# Patient Record
Sex: Female | Born: 1970 | Race: White | Hispanic: No | Marital: Single | Smoking: Current every day smoker
Health system: Southern US, Community
[De-identification: ages and names within clinical notes are randomized; demographics above are authoritative.]

## PROBLEM LIST (undated history)

## (undated) DIAGNOSIS — A0472 Enterocolitis due to Clostridium difficile, not specified as recurrent: Secondary | ICD-10-CM

## (undated) DIAGNOSIS — F419 Anxiety disorder, unspecified: Secondary | ICD-10-CM

## (undated) DIAGNOSIS — L988 Other specified disorders of the skin and subcutaneous tissue: Secondary | ICD-10-CM

## (undated) DIAGNOSIS — I10 Essential (primary) hypertension: Secondary | ICD-10-CM

## (undated) DIAGNOSIS — F319 Bipolar disorder, unspecified: Secondary | ICD-10-CM

## (undated) DIAGNOSIS — T1491XA Suicide attempt, initial encounter: Secondary | ICD-10-CM

## (undated) DIAGNOSIS — F32A Depression, unspecified: Secondary | ICD-10-CM

## (undated) DIAGNOSIS — A4902 Methicillin resistant Staphylococcus aureus infection, unspecified site: Secondary | ICD-10-CM

## (undated) DIAGNOSIS — E785 Hyperlipidemia, unspecified: Secondary | ICD-10-CM

## (undated) DIAGNOSIS — K219 Gastro-esophageal reflux disease without esophagitis: Secondary | ICD-10-CM

## (undated) DIAGNOSIS — F329 Major depressive disorder, single episode, unspecified: Secondary | ICD-10-CM

## (undated) HISTORY — DX: Enterocolitis due to Clostridium difficile, not specified as recurrent: A04.72

## (undated) HISTORY — PX: APPENDECTOMY: SHX54

## (undated) HISTORY — DX: Hyperlipidemia, unspecified: E78.5

## (undated) HISTORY — DX: Suicide attempt, initial encounter: T14.91XA

## (undated) HISTORY — DX: Gastro-esophageal reflux disease without esophagitis: K21.9

## (undated) HISTORY — DX: Essential (primary) hypertension: I10

## (undated) NOTE — *Deleted (*Deleted)
   08/09/20 1330  Clinical Encounter Type  Visited With Other (Comment)  Visit Type Follow-up  Referral From Patient  Consult/Referral To Chaplain  Chaplain spoke with the patient via the phone. Chaplain confirmed the patient still desire to speak with Buddhist monk. The patient confirmed. The chaplain called local Mort Sawyers in community, [place] [phone]. Chaplain left message requesting call back.

---

## 1999-02-18 ENCOUNTER — Other Ambulatory Visit: Admission: RE | Admit: 1999-02-18 | Discharge: 1999-02-18 | Payer: Self-pay | Admitting: Obstetrics and Gynecology

## 1999-08-16 ENCOUNTER — Encounter: Admission: RE | Admit: 1999-08-16 | Discharge: 1999-08-16 | Payer: Self-pay | Admitting: Family Medicine

## 1999-08-16 ENCOUNTER — Inpatient Hospital Stay (HOSPITAL_COMMUNITY): Admission: EM | Admit: 1999-08-16 | Discharge: 1999-08-18 | Payer: Self-pay | Admitting: Emergency Medicine

## 1999-08-16 ENCOUNTER — Encounter (INDEPENDENT_AMBULATORY_CARE_PROVIDER_SITE_OTHER): Payer: Self-pay

## 1999-08-16 ENCOUNTER — Encounter: Payer: Self-pay | Admitting: Family Medicine

## 2000-05-15 ENCOUNTER — Other Ambulatory Visit: Admission: RE | Admit: 2000-05-15 | Discharge: 2000-05-15 | Payer: Self-pay | Admitting: Obstetrics and Gynecology

## 2000-08-29 ENCOUNTER — Inpatient Hospital Stay (HOSPITAL_COMMUNITY): Admission: AD | Admit: 2000-08-29 | Discharge: 2000-08-29 | Payer: Self-pay | Admitting: Obstetrics and Gynecology

## 2000-11-26 ENCOUNTER — Other Ambulatory Visit: Admission: RE | Admit: 2000-11-26 | Discharge: 2000-11-26 | Payer: Self-pay | Admitting: Obstetrics and Gynecology

## 2001-02-18 ENCOUNTER — Inpatient Hospital Stay (HOSPITAL_COMMUNITY): Admission: AD | Admit: 2001-02-18 | Discharge: 2001-02-21 | Payer: Self-pay | Admitting: Obstetrics and Gynecology

## 2001-04-02 ENCOUNTER — Other Ambulatory Visit: Admission: RE | Admit: 2001-04-02 | Discharge: 2001-04-02 | Payer: Self-pay | Admitting: *Deleted

## 2001-12-13 ENCOUNTER — Ambulatory Visit (HOSPITAL_BASED_OUTPATIENT_CLINIC_OR_DEPARTMENT_OTHER): Admission: RE | Admit: 2001-12-13 | Discharge: 2001-12-13 | Payer: Self-pay | Admitting: Orthopedic Surgery

## 2001-12-13 ENCOUNTER — Encounter (INDEPENDENT_AMBULATORY_CARE_PROVIDER_SITE_OTHER): Payer: Self-pay | Admitting: Specialist

## 2002-04-28 ENCOUNTER — Other Ambulatory Visit: Admission: RE | Admit: 2002-04-28 | Discharge: 2002-04-28 | Payer: Self-pay | Admitting: Obstetrics and Gynecology

## 2002-10-13 ENCOUNTER — Encounter: Admission: RE | Admit: 2002-10-13 | Discharge: 2002-10-13 | Payer: Self-pay | Admitting: Family Medicine

## 2002-10-13 ENCOUNTER — Encounter: Payer: Self-pay | Admitting: Family Medicine

## 2002-10-13 ENCOUNTER — Emergency Department (HOSPITAL_COMMUNITY): Admission: EM | Admit: 2002-10-13 | Discharge: 2002-10-13 | Payer: Self-pay

## 2003-04-30 ENCOUNTER — Other Ambulatory Visit: Admission: RE | Admit: 2003-04-30 | Discharge: 2003-04-30 | Payer: Self-pay | Admitting: Obstetrics and Gynecology

## 2003-06-30 ENCOUNTER — Ambulatory Visit (HOSPITAL_COMMUNITY): Admission: RE | Admit: 2003-06-30 | Discharge: 2003-06-30 | Payer: Self-pay | Admitting: Internal Medicine

## 2003-06-30 ENCOUNTER — Encounter: Payer: Self-pay | Admitting: Internal Medicine

## 2003-08-06 ENCOUNTER — Ambulatory Visit (HOSPITAL_COMMUNITY): Admission: RE | Admit: 2003-08-06 | Discharge: 2003-08-06 | Payer: Self-pay | Admitting: Pulmonary Disease

## 2003-11-25 ENCOUNTER — Other Ambulatory Visit: Admission: RE | Admit: 2003-11-25 | Discharge: 2003-11-25 | Payer: Self-pay | Admitting: Obstetrics and Gynecology

## 2004-04-20 ENCOUNTER — Inpatient Hospital Stay (HOSPITAL_COMMUNITY): Admission: AD | Admit: 2004-04-20 | Discharge: 2004-04-20 | Payer: Self-pay | Admitting: Obstetrics and Gynecology

## 2004-04-28 ENCOUNTER — Inpatient Hospital Stay (HOSPITAL_COMMUNITY): Admission: AD | Admit: 2004-04-28 | Discharge: 2004-04-28 | Payer: Self-pay | Admitting: Obstetrics & Gynecology

## 2004-05-02 ENCOUNTER — Inpatient Hospital Stay (HOSPITAL_COMMUNITY): Admission: AD | Admit: 2004-05-02 | Discharge: 2004-05-02 | Payer: Self-pay | Admitting: Obstetrics and Gynecology

## 2004-05-10 ENCOUNTER — Inpatient Hospital Stay (HOSPITAL_COMMUNITY): Admission: AD | Admit: 2004-05-10 | Discharge: 2004-05-10 | Payer: Self-pay | Admitting: Obstetrics and Gynecology

## 2004-06-08 ENCOUNTER — Inpatient Hospital Stay (HOSPITAL_COMMUNITY): Admission: AD | Admit: 2004-06-08 | Discharge: 2004-06-10 | Payer: Self-pay | Admitting: Obstetrics and Gynecology

## 2004-07-25 ENCOUNTER — Other Ambulatory Visit: Admission: RE | Admit: 2004-07-25 | Discharge: 2004-07-25 | Payer: Self-pay | Admitting: Obstetrics and Gynecology

## 2004-08-02 ENCOUNTER — Ambulatory Visit: Payer: Self-pay | Admitting: Pulmonary Disease

## 2004-08-11 ENCOUNTER — Emergency Department (HOSPITAL_COMMUNITY): Admission: EM | Admit: 2004-08-11 | Discharge: 2004-08-11 | Payer: Self-pay | Admitting: Emergency Medicine

## 2005-05-16 ENCOUNTER — Ambulatory Visit: Payer: Self-pay | Admitting: Pulmonary Disease

## 2005-08-08 ENCOUNTER — Other Ambulatory Visit: Admission: RE | Admit: 2005-08-08 | Discharge: 2005-08-08 | Payer: Self-pay | Admitting: Obstetrics and Gynecology

## 2006-04-25 ENCOUNTER — Encounter: Admission: RE | Admit: 2006-04-25 | Discharge: 2006-04-25 | Payer: Self-pay | Admitting: Otolaryngology

## 2006-04-30 ENCOUNTER — Ambulatory Visit (HOSPITAL_BASED_OUTPATIENT_CLINIC_OR_DEPARTMENT_OTHER): Admission: RE | Admit: 2006-04-30 | Discharge: 2006-04-30 | Payer: Self-pay | Admitting: Otolaryngology

## 2006-04-30 ENCOUNTER — Encounter (INDEPENDENT_AMBULATORY_CARE_PROVIDER_SITE_OTHER): Payer: Self-pay | Admitting: Specialist

## 2011-02-10 NOTE — Op Note (Signed)
[place]  Patient:    HEAVIN, SEBREE Visit Number: [id number] MRN: [id number]          Service Type: DSU Location: [facility] Attending Physician:  Ronne Binning Dictated by:   [name] Reaper, M.D. Proc. Date: 12/13/01 Admit Date:  12/13/2001                             Operative Report  PREOPERATIVE DIAGNOSIS:  Mass, right wrist.  POSTOPERATIVE DIAGNOSIS:  Mass, right wrist.  OPERATION:  Excision mass, right wrist.  SURGEON:  [name] Reaper, M.D.  ANESTHESIA:  Local.  DATE OF OPERATION:  December 13, 2001  HISTORY:  The patient is a 40 year old female who had an IV started in her right wrist.  She complains of pain and swelling in the area since the injury. She has an area of discoloration and a small mass beneath the skin.  DESCRIPTION OF PROCEDURE:  The patient was brought to the operating room where a local anesthetic was given with MAC supplementation.  After being prepped and draped using DuraPrep a tourniquet placed high on the arm was inflated to 250 mmHg.  An elliptical incision was made around the area of discoloration. The elliptical incision was deepened into the subcutaneous tissue and the entire area was excised in toto.  The underlying area was explored down to the radial artery.  No further lesions were identified, no masses were palpated deep.  The wound was irrigated.  The skin was closed with a subcuticular 4-0 Monocryl suture.  Steri-Strips were applied, sterile compressive dressing was applied.  The patient tolerated the procedure well.  Note that the specimen was sent to pathology.  DISPOSITION:  The patient is discharged home to return to [facility] in one week on Vicodin and Keflex. Dictated by:   [name] Reaper, M.D. Attending Physician:  Ronne Binning DD:  12/13/01 TD:  12/14/01 Job: [id number]

## 2011-02-10 NOTE — Op Note (Signed)
NAMEMEKESHA, SOLOMON                  ACCOUNT NO.:  [id number]   MEDICAL RECORD NO.:  [id number]          PATIENT TYPE:  AMB   LOCATION:  DSC                          FACILITY:  [facility]   PHYSICIAN:  Jefry H. 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   PROCEDURE:  1. Left endoscopic sphenoidotomy.  2. InstaTrak image-guided assistant use.   SURGEON:  Jefry H. Pollyann Kennedy, M.D.   General endotracheal anesthesia was used.  No complications.   FINDINGS:  Left sphenoid sinus completely obstructed, but when opened there  was a bony ridge that when removed revealed a large amount of very thick,  tenacious mucosal secretions and pus.  There was additional pus found within  a partially aerated middle turbinate.   BLOOD LOSS:  Minimal.  No complications.   REFERRING PHYSICIAN:  Evelena Peat, M.D.   HISTORY:  A 40 year old lady with a 2-1/83-month history of severe left-sided  retro-orbital headache not responsive to antimicrobial therapy and found on  CT imaging to have a chronically opacified left sphenoid sinus.  Risks,  benefits, alternatives, and complications of the procedure were explained to  the patient.  She seemed to understand and agreed to surgery.   PROCEDURE:  The patient was taken to the operating room and placed on the  operating room table in a supine position.  Following induction of general  endotracheal anesthesia, the patient was prepped and draped in the standard  fashion.  Oxymetazoline spray was used preoperatively in the nasal cavities,  and 1% Xylocaine with epinephrine was infiltrated into the superior and  posterior attachments of the middle turbinate on the left and using a spinal  needle into the  face of the sphenoid.  The InstaTrak head gear was placed  into position, and the straight suction probe was calibrated and position  was verified.  The InstaTrak was instrumental in being certain about the  position of the sphenoid, since there was no natural ostium identified and  also as far as being able to break down the bony septation within the  sphenoid that was not the intersinus septum.  It was a horizontally located  bony ridge, behind which was the infected mucocele.  Using the straight  suction, the sinus was entered by breaking through the anterior wall.  A  down-biting Kerrison rongeur was used to enlarge the opening inferiorly and  medially.  The sinus was inspected and when the bony ridge was pressed on  and eventually removed, a large amount of pus was identified and aspirated  into a Lukens trap for culture.  The additional pus that was identified  within the middle  turbinate was also cleaned out, and a microdebrider was used to enlarge a  small opening into the medial surface of the middle turbinate.  No other  disease  was identified.  Pharynx was suctioned of blood and secretions.  The  patient was then awakened, extubated, and transferred to recovery in stable  condition.      Jefry H. Pollyann Kennedy, MD  Electronically Signed     JHR/MEDQ  D:  04/30/2006  T:  05/01/2006  Job:  [id number]   cc:   Evelena Peat, M.D.

## 2011-09-04 ENCOUNTER — Emergency Department (HOSPITAL_COMMUNITY)
Admission: EM | Admit: 2011-09-04 | Discharge: 2011-09-05 | Disposition: A | Discharge status: Home / Self Care | Payer: Self-pay | Attending: Emergency Medicine | Admitting: Emergency Medicine

## 2011-09-04 DIAGNOSIS — R197 Diarrhea, unspecified: Secondary | ICD-10-CM | POA: Insufficient documentation

## 2011-09-04 DIAGNOSIS — R109 Unspecified abdominal pain: Secondary | ICD-10-CM | POA: Insufficient documentation

## 2011-09-04 DIAGNOSIS — R112 Nausea with vomiting, unspecified: Secondary | ICD-10-CM | POA: Insufficient documentation

## 2011-09-04 DIAGNOSIS — R079 Chest pain, unspecified: Secondary | ICD-10-CM | POA: Insufficient documentation

## 2011-09-04 HISTORY — DX: Major depressive disorder, single episode, unspecified: F32.9

## 2011-09-04 HISTORY — DX: Depression, unspecified: F32.A

## 2011-09-05 ENCOUNTER — Other Ambulatory Visit: Payer: Self-pay

## 2011-09-05 ENCOUNTER — Emergency Department (HOSPITAL_COMMUNITY): Payer: Self-pay

## 2011-09-05 ENCOUNTER — Encounter (HOSPITAL_COMMUNITY): Payer: Self-pay | Admitting: *Deleted

## 2011-09-05 LAB — RAPID URINE DRUG SCREEN, HOSP PERFORMED
Amphetamines: POSITIVE — AB
Barbiturates: NOT DETECTED
Benzodiazepines: NOT DETECTED
Cocaine: POSITIVE — AB
Opiates: NOT DETECTED
Tetrahydrocannabinol: NOT DETECTED

## 2011-09-05 LAB — URINE MICROSCOPIC-ADD ON

## 2011-09-05 LAB — CBC
HCT: 38.3 % (ref 36.0–46.0)
Hemoglobin: 13.3 g/dL (ref 12.0–15.0)
MCH: 30.6 pg (ref 26.0–34.0)
MCHC: 34.7 g/dL (ref 30.0–36.0)
MCV: 88 fL (ref 78.0–100.0)
Platelets: 231 10*3/uL (ref 150–400)
RBC: 4.35 MIL/uL (ref 3.87–5.11)
RDW: 12.8 % (ref 11.5–15.5)
WBC: 7.1 10*3/uL (ref 4.0–10.5)

## 2011-09-05 LAB — LIPASE, BLOOD: Lipase: 33 U/L (ref 11–59)

## 2011-09-05 LAB — COMPREHENSIVE METABOLIC PANEL
ALT: 20 U/L (ref 0–35)
AST: 17 U/L (ref 0–37)
Albumin: 3.9 g/dL (ref 3.5–5.2)
Alkaline Phosphatase: 77 U/L (ref 39–117)
BUN: 11 mg/dL (ref 6–23)
CO2: 27 mEq/L (ref 19–32)
Calcium: 9.7 mg/dL (ref 8.4–10.5)
Chloride: 105 mEq/L (ref 96–112)
Creatinine, Ser: 0.79 mg/dL (ref 0.50–1.10)
GFR calc Af Amer: >90 mL/min (ref >90)
GFR calc non Af Amer: >90 mL/min (ref >90)
Glucose, Bld: 108 mg/dL — ABNORMAL HIGH (ref 70–99)
Potassium: 4.1 mEq/L (ref 3.5–5.1)
Sodium: 139 mEq/L (ref 135–145)
Total Bilirubin: 0.1 mg/dL — ABNORMAL LOW (ref 0.3–1.2)
Total Protein: 7.1 g/dL (ref 6.0–8.3)

## 2011-09-05 LAB — URINALYSIS, ROUTINE W REFLEX MICROSCOPIC
Bilirubin Urine: NEGATIVE
Glucose, UA: NEGATIVE mg/dL
Hgb urine dipstick: NEGATIVE
Ketones, ur: NEGATIVE mg/dL
Nitrite: NEGATIVE
Protein, ur: NEGATIVE mg/dL
Specific Gravity, Urine: 1.03 (ref 1.005–1.030)
Urobilinogen, UA: 0.2 mg/dL (ref 0.0–1.0)
pH: 6 (ref 5.0–8.0)

## 2011-09-05 LAB — ETHANOL: Alcohol, Ethyl (B): <11 mg/dL (ref 0–11)

## 2011-09-05 LAB — PREGNANCY, URINE: Preg Test, Ur: NEGATIVE

## 2011-09-05 MED ORDER — MORPHINE SULFATE 2 MG/ML IJ SOLN
INTRAMUSCULAR | Status: AC
  Administered 2011-09-05: 4 mg via INTRAVENOUS
  Filled 2011-09-05: qty 2

## 2011-09-05 MED ORDER — SODIUM CHLORIDE 0.9 % IV BOLUS (SEPSIS)
1000.0000 mL | Freq: Once | INTRAVENOUS | Status: AC
  Administered 2011-09-05: 1000 mL via INTRAVENOUS

## 2011-09-05 MED ORDER — MORPHINE SULFATE 4 MG/ML IJ SOLN: 4.0000 mg | Freq: Once | INTRAMUSCULAR | Status: DC

## 2011-09-05 NOTE — ED Notes (Signed)
Brought in by EMS with a CC of ingestion of baking soda. Pt states she took heaping spoonfuls and now she wants her PH level checked. Complains of chest pain and chest palpitations.

## 2011-09-05 NOTE — ED Notes (Signed)
Pt HTN recheck by Dr. Patrica Duel.the patient to f/u with PCP.....discussed need to detox off Cocaine.Marland KitchenMarland Kitchen

## 2011-09-05 NOTE — ED Notes (Signed)
Sinus rhythm on monitor---Morphine given IV--rates pain (left chest) a 7 on 1-10 scale.

## 2011-09-05 NOTE — ED Notes (Signed)
Cath not done as pt. Was able to void

## 2011-09-05 NOTE — ED Notes (Signed)
Drinking juice and appears tobe in no distress.

## 2011-09-05 NOTE — ED Notes (Signed)
Bolus infused---Pt. Awakened and requested for her to give urine specimen---few minutes later--went back to room and she was still lying on bed---explained again that needed urine or if unable to void, order has been placed by MD for cath---stated she would try---ambulatory to restroom.

## 2011-09-05 NOTE — ED Notes (Signed)
C/o chest pain and being dehydrated---Very anxious and jittery----requesting medication---unable to void.  Iv bolus started and urine cup given

## 2011-09-05 NOTE — ED Provider Notes (Addendum)
History     CSN: [id number] Arrival date & time: 09/04/2011 11:57 PM   First MD Initiated Contact with Patient 09/05/11 0028      Chief Complaint  Patient presents with  . Chest Pain     HPI The patient reports she develop mild left-sided abdominal pain yesterday without nausea vomiting and diarrhea.  Because of that she took several spoonfuls of baking soda and swallowed it in an attempt to decrease the acid in her stomach.  Today she reports that her abdominal pain is improved however she is now having chest pain and palpitations.  She has no shortness of breath.  She's had no fever or chills.  She's had no nausea vomiting diarrhea.  No abdominal pain is improving at this time.  She has no dysuria or urinary frequency.  She requests that she have her "pH" checked.  Nothing worsens her symptoms.  Nothing improves her symptoms.  Her symptoms are constant.   Past Medical History  Diagnosis Date  . Depression     History reviewed. No pertinent past surgical history.  History reviewed. No pertinent family history.  History  Substance Use Topics  . Smoking status: Never Smoker   . Smokeless tobacco: Not on file  . Alcohol Use: No    OB History    Grav Para Term Preterm Abortions TAB SAB Ect Mult Living                  Review of Systems  All other systems reviewed and are negative.    Allergies  Review of patient's allergies indicates no known allergies.  Home Medications   Current Outpatient Rx  Name Route Sig Dispense Refill  . GABAPENTIN 600 MG PO TABS Oral Take 1,200 mg by mouth daily.      . TRAZODONE HCL 100 MG PO TABS Oral Take 100 mg by mouth at bedtime.        BP 138/101  Pulse 85  Temp 98.9 F (37.2 C)  Resp 20  SpO2 100%  Physical Exam  Nursing note and vitals reviewed. Constitutional: She is oriented to person, place, and time. She appears well-developed and well-nourished. No distress.  HENT:  Head: Normocephalic and atraumatic.  Eyes: EOM  are normal.  Neck: Normal range of motion.  Cardiovascular: Normal rate, regular rhythm and normal heart sounds.   Pulmonary/Chest: Effort normal and breath sounds normal.  Abdominal: Soft. She exhibits no distension. There is no tenderness.  Musculoskeletal: Normal range of motion.  Neurological: She is alert and oriented to person, place, and time.  Skin: Skin is warm and dry.  Psychiatric: She has a normal mood and affect. Judgment normal.    ED Course  Procedures (including critical care time)   Date: 09/05/2011  Rate: 80  Rhythm: normal sinus rhythm  QRS Axis: normal  Intervals: normal  ST/T Wave abnormalities: normal  Conduction Disutrbances:none  Narrative Interpretation:   Old EKG Reviewed: no prior ecg    Labs Reviewed  COMPREHENSIVE METABOLIC PANEL - Abnormal; Notable for the following:    Glucose, Bld 108 (*)    Total Bilirubin 0.1 (*)    All other components within normal limits  CBC  LIPASE, BLOOD  ETHANOL  PREGNANCY, URINE  URINE RAPID DRUG SCREEN (HOSP PERFORMED)  URINALYSIS, ROUTINE W REFLEX MICROSCOPIC   Dg Chest 2 View  09/05/2011  *RADIOLOGY REPORT*  Clinical Data: Chest pain  CHEST - 2 VIEW  Comparison: Chest CT from 08/06/2003  Findings: The lungs  are clear without focal infiltrate, edema, pneumothorax or pleural effusion. The cardiopericardial silhouette is within normal limits for size.  Single nodular density projecting over each lower lung is compatible with nipple shadow. Imaged bony structures of the thorax are intact. Telemetry leads overlie the chest.  IMPRESSION: Normal exam.  Original Report Authenticated By: ERIC A. MANSELL, M.D.     1. Abdominal pain   2. Chest pain       MDM  The chest pain is nonspecific.  Doubt ACS.  Doubt PE.  I doubt there is a complication from her ingesting a few spoonfuls of baking soda.  Her renal function is normal.  Her abdominal pain started prior to eating the baking soda.  Discharge home in good  condition        Lyanne Co, MD 09/05/11 4098  Lyanne Co, MD 09/05/11 [phone]

## 2012-05-16 ENCOUNTER — Emergency Department (HOSPITAL_COMMUNITY)
Admission: EM | Admit: 2012-05-16 | Discharge: 2012-05-17 | Disposition: A | Discharge status: Home / Self Care | Payer: Self-pay | Attending: Emergency Medicine | Admitting: Emergency Medicine

## 2012-05-16 ENCOUNTER — Encounter (HOSPITAL_COMMUNITY): Payer: Self-pay

## 2012-05-16 DIAGNOSIS — F319 Bipolar disorder, unspecified: Secondary | ICD-10-CM | POA: Insufficient documentation

## 2012-05-16 DIAGNOSIS — F29 Unspecified psychosis not due to a substance or known physiological condition: Secondary | ICD-10-CM | POA: Insufficient documentation

## 2012-05-16 HISTORY — DX: Bipolar disorder, unspecified: F31.9

## 2012-05-16 LAB — COMPREHENSIVE METABOLIC PANEL
ALT: 15 U/L (ref 0–35)
AST: 29 U/L (ref 0–37)
Albumin: 4.6 g/dL (ref 3.5–5.2)
Alkaline Phosphatase: 47 U/L (ref 39–117)
BUN: 17 mg/dL (ref 6–23)
CO2: 25 mEq/L (ref 19–32)
Calcium: 9.7 mg/dL (ref 8.4–10.5)
Chloride: 100 mEq/L (ref 96–112)
Creatinine, Ser: 0.82 mg/dL (ref 0.50–1.10)
GFR calc Af Amer: >90 mL/min (ref >90)
GFR calc non Af Amer: 89 mL/min — ABNORMAL LOW (ref >90)
Glucose, Bld: 135 mg/dL — ABNORMAL HIGH (ref 70–99)
Potassium: 3.5 mEq/L (ref 3.5–5.1)
Sodium: 136 mEq/L (ref 135–145)
Total Bilirubin: 0.3 mg/dL (ref 0.3–1.2)
Total Protein: 7.2 g/dL (ref 6.0–8.3)

## 2012-05-16 LAB — CBC
HCT: 35.8 % — ABNORMAL LOW (ref 36.0–46.0)
Hemoglobin: 12.4 g/dL (ref 12.0–15.0)
MCH: 31.1 pg (ref 26.0–34.0)
MCHC: 34.6 g/dL (ref 30.0–36.0)
MCV: 89.7 fL (ref 78.0–100.0)
Platelets: 220 10*3/uL (ref 150–400)
RBC: 3.99 MIL/uL (ref 3.87–5.11)
RDW: 12.3 % (ref 11.5–15.5)
WBC: 17.3 10*3/uL — ABNORMAL HIGH (ref 4.0–10.5)

## 2012-05-16 LAB — POCT PREGNANCY, URINE: Preg Test, Ur: NEGATIVE

## 2012-05-16 LAB — ACETAMINOPHEN LEVEL: Acetaminophen (Tylenol), Serum: <15 ug/mL (ref 10–30)

## 2012-05-16 LAB — ETHANOL: Alcohol, Ethyl (B): <11 mg/dL (ref 0–11)

## 2012-05-16 MED ORDER — IBUPROFEN 600 MG PO TABS
600.0000 mg | ORAL_TABLET | Freq: Four times a day (QID) | ORAL | Status: DC | PRN
  Administered 2012-05-16 – 2012-05-17 (×2): 600 mg via ORAL
  Filled 2012-05-16 (×2): qty 1

## 2012-05-16 NOTE — ED Notes (Signed)
Pt stated shew cant urinate

## 2012-05-16 NOTE — ED Notes (Signed)
Pt reports that at approx 10:00 this morning she began having a panic attack.  According to EMS pt left her two kids at home and crawled out of a window and was found naked walking up the driveway. Stated it appeared as though pt had been in a creek, states she was dirty and wet. Pt states she fell from tripping over a branch earlier and is having left hip and back pain, but was ambulatory afterwards.  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 States the disease is attacking her and is in the fabric and threads.  Pt has facial ticks and is very particular about her environment.  Pt is A/O. Denies SI/HI. NAD noted at this time.

## 2012-05-16 NOTE — ED Notes (Signed)
Patient gave about 1cc of urine, took urine to lab to run urine pregnancy test. Will report to oncoming nt that we still need urine for uds.

## 2012-05-16 NOTE — ED Notes (Signed)
[id number]<BJ> Expected date:<BR> Expected time:<BR> Means of arrival:<BR> Comments:<BR> combative

## 2012-05-16 NOTE — ED Provider Notes (Addendum)
History     CSN: [id number]  Arrival date & time 05/16/12  1557   First MD Initiated Contact with Patient 05/16/12 1643      Chief Complaint  Patient presents with  . Medical Clearance    (Consider location/radiation/quality/duration/timing/severity/associated sxs/prior treatment) The history is provided by the patient and a parent. The history is limited by the condition of the patient.  pt has MORGELLAN syndrome.  She is acutely psychotic.  Apparently, she was in was taking off a loose.  Her mother called her psychiatrist, and was told to come to the emergency department for admission.  Because acute psychosis.  Level V caveat applies for psychosis  Past Medical History  Diagnosis Date  . Depression   . Bipolar 1 disorder     History reviewed. No pertinent past surgical history.  No family history on file.  History  Substance Use Topics  . Smoking status: Never Smoker   . Smokeless tobacco: Not on file  . Alcohol Use: No    OB History    Grav Para Term Preterm Abortions TAB SAB Ect Mult Living                  Review of Systems  Unable to perform ROS   Allergies  Review of patient's allergies indicates no known allergies.  Home Medications   Current Outpatient Rx  Name Route Sig Dispense Refill  . ARIPIPRAZOLE 15 MG PO TABS Oral Take 15 mg by mouth daily.    Marland Kitchen GABAPENTIN 600 MG PO TABS Oral Take 1,200 mg by mouth at bedtime.     . TRAZODONE HCL 100 MG PO TABS Oral Take 100 mg by mouth at bedtime.        BP 116/98  Pulse 103  Temp 98.4 F (36.9 C) (Oral)  Resp 23  SpO2 100%  Physical Exam  Nursing note and vitals reviewed. Constitutional: She appears well-developed and well-nourished.  HENT:  Head: Normocephalic and atraumatic.  Eyes: Conjunctivae are normal.  Neck: Normal range of motion.  Pulmonary/Chest: Effort normal.  Abdominal: She exhibits no distension.  Musculoskeletal: Normal range of motion.  Neurological: She is alert.  Skin:  Skin is warm and dry.  Psychiatric:       Acute psychosis    ED Course  Procedures (including critical care time)  Labs Reviewed  CBC - Abnormal; Notable for the following:    WBC 17.3 (*)     HCT 35.8 (*)     All other components within normal limits  COMPREHENSIVE METABOLIC PANEL  ETHANOL  ACETAMINOPHEN LEVEL  URINE RAPID DRUG SCREEN (HOSP PERFORMED)   No results found.   No diagnosis found.    MDM  psychosis        Cheri Guppy, MD 05/16/12 1816  Pt was seen by the psychiatrist.  He says she does not need to be admitted.  She should follow up with her counselor.   Cheri Guppy, MD 05/17/12 1550

## 2012-05-16 NOTE — ED Notes (Signed)
Pt.is in blue scrubs,vitals sign were taken,security wanded

## 2012-05-16 NOTE — ED Notes (Signed)
Per EMS pt had been missing x2hrs, crawled out her bedroom window was found out in the woods naked. Pt states "I have had a break down like this 6months."; pt denies drug use or ETOH, pt denies SI/HI

## 2012-05-16 NOTE — ED Notes (Addendum)
Pt's purse and all belongings taken home by her mother

## 2012-05-17 DIAGNOSIS — F319 Bipolar disorder, unspecified: Secondary | ICD-10-CM

## 2012-05-17 DIAGNOSIS — F1410 Cocaine abuse, uncomplicated: Secondary | ICD-10-CM

## 2012-05-17 LAB — RAPID URINE DRUG SCREEN, HOSP PERFORMED
Amphetamines: NOT DETECTED
Barbiturates: NOT DETECTED
Benzodiazepines: NOT DETECTED
Cocaine: POSITIVE — AB
Opiates: NOT DETECTED
Tetrahydrocannabinol: NOT DETECTED

## 2012-05-17 MED ORDER — LORAZEPAM 1 MG PO TABS
2.0000 mg | ORAL_TABLET | Freq: Once | ORAL | Status: AC
  Administered 2012-05-17: 2 mg via ORAL
  Filled 2012-05-17: qty 2

## 2012-05-17 MED ORDER — GABAPENTIN 400 MG PO CAPS
1200.0000 mg | ORAL_CAPSULE | Freq: Every day | ORAL | Status: DC
  Filled 2012-05-17: qty 3

## 2012-05-17 MED ORDER — ARIPIPRAZOLE 15 MG PO TABS
15.0000 mg | ORAL_TABLET | Freq: Every day | ORAL | Status: DC
  Administered 2012-05-17: 15 mg via ORAL
  Filled 2012-05-17: qty 1

## 2012-05-17 MED ORDER — TRAZODONE HCL 100 MG PO TABS: 100.0000 mg | ORAL_TABLET | Freq: Every day | ORAL | Status: DC

## 2012-05-17 NOTE — ED Notes (Signed)
Pt awaiting ACT to see pending [facility]

## 2012-05-17 NOTE — Consult Note (Signed)
Reason for Consult: Bipolar disorder, panic disorder, and substance abuse Referring Physician: Dr.CapoRossi  Margaret Mccann is an 41 y.o. female.  HPI: Patient was seen and chart reviewed. Patient has no previous history of acute psychiatric hospitalization. Patient reported she has been suffering with the anxiety and depression over 2 years. Patient was involved with the bad divorce about 2 years ago. Patient has been seeing Dr. Dub Mikes, who has been prescribing her medication trazodone 100 mg at bedtime, Neurontin 1200 mg at bedtime and Abilify 15 mg daily morning. Patient denies the current symptoms of depression, anxiety, and psychosis. Patient mother was supportive to her. Patient has the 2 younger children, 13 years old son and 7 years daughter and has a 50-50 custody with her ex-husband. Patient urine drug screen was positive for cocaine. Patient has denied current symptoms of depression, anxiety, suicidal or homicidal ideation, intentions and plan. Patient has no evidence of psychotic symptoms.  Past Medical History  Diagnosis Date  . Depression   . Bipolar 1 disorder     History reviewed. No pertinent past surgical history.  No family history on file.  Social History:  reports that she has never smoked. She does not have any smokeless tobacco history on file. She reports that she does not drink alcohol or use illicit drugs.  Allergies: No Known Allergies  Medications: I have reviewed the patient's current medications.  Results for orders placed during the hospital encounter of 05/16/12 (from the past 48 hour(s))  CBC     Status: Abnormal   Collection Time   05/16/12  4:46 PM      Component Value Range Comment   WBC 17.3 (*) 4.0 - 10.5 K/uL    RBC 3.99  3.87 - 5.11 MIL/uL    Hemoglobin 12.4  12.0 - 15.0 g/dL    HCT 16.1 (*) 09.6 - 46.0 %    MCV 89.7  78.0 - 100.0 fL    MCH 31.1  26.0 - 34.0 pg    MCHC 34.6  30.0 - 36.0 g/dL    RDW 04.5  40.9 - 81.1 %    Platelets 220  150 - 400  K/uL   COMPREHENSIVE METABOLIC PANEL     Status: Abnormal   Collection Time   05/16/12  4:46 PM      Component Value Range Comment   Sodium 136  135 - 145 mEq/L    Potassium 3.5  3.5 - 5.1 mEq/L    Chloride 100  96 - 112 mEq/L    CO2 25  19 - 32 mEq/L    Glucose, Bld 135 (*) 70 - 99 mg/dL    BUN 17  6 - 23 mg/dL    Creatinine, Ser 9.14  0.50 - 1.10 mg/dL    Calcium 9.7  8.4 - 78.2 mg/dL    Total Protein 7.2  6.0 - 8.3 g/dL    Albumin 4.6  3.5 - 5.2 g/dL    AST 29  0 - 37 U/L    ALT 15  0 - 35 U/L    Alkaline Phosphatase 47  39 - 117 U/L    Total Bilirubin 0.3  0.3 - 1.2 mg/dL    GFR calc non Af Amer 89 (*) >90 mL/min    GFR calc Af Amer >90  >90 mL/min   ETHANOL     Status: Normal   Collection Time   05/16/12  4:46 PM      Component Value Range Comment   Alcohol,  Ethyl (B) <11  0 - 11 mg/dL   ACETAMINOPHEN LEVEL     Status: Normal   Collection Time   05/16/12  4:46 PM      Component Value Range Comment   Acetaminophen (Tylenol), Serum <15.0  10 - 30 ug/mL   POCT PREGNANCY, URINE     Status: Normal   Collection Time   05/16/12  6:54 PM      Component Value Range Comment   Preg Test, Ur NEGATIVE  NEGATIVE   URINE RAPID DRUG SCREEN (HOSP PERFORMED)     Status: Abnormal   Collection Time   05/17/12 11:39 AM      Component Value Range Comment   Opiates NONE DETECTED  NONE DETECTED    Cocaine POSITIVE (*) NONE DETECTED    Benzodiazepines NONE DETECTED  NONE DETECTED    Amphetamines NONE DETECTED  NONE DETECTED    Tetrahydrocannabinol NONE DETECTED  NONE DETECTED    Barbiturates NONE DETECTED  NONE DETECTED     No results found.  Positive for bad mood, depression, illegal drug usage and sleep disturbance Blood pressure 98/52, pulse 71, temperature 98.1 F (36.7 C), temperature source Oral, resp. rate 18, SpO2 100.00%.   Assessment/Plan: Bipolar disorder, unspecified Cocaine intoxication Noncompliant with medication  Recommended patient does not meet criteria for acute  psychiatric hospitalization and referred she'll outpatient psychiatric services and followup with the Dr. Heidi Dach R. 05/17/2012, 12:11 PM
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
## 2012-05-31 ENCOUNTER — Encounter (HOSPITAL_COMMUNITY): Payer: Self-pay

## 2012-05-31 ENCOUNTER — Emergency Department (HOSPITAL_COMMUNITY)
Admission: EM | Admit: 2012-05-31 | Discharge: 2012-05-31 | Disposition: A | Discharge status: Home / Self Care | Payer: Self-pay | Attending: Emergency Medicine | Admitting: Emergency Medicine

## 2012-05-31 ENCOUNTER — Emergency Department (HOSPITAL_COMMUNITY): Payer: Self-pay

## 2012-05-31 ENCOUNTER — Encounter (HOSPITAL_COMMUNITY): Payer: Self-pay | Admitting: *Deleted

## 2012-05-31 ENCOUNTER — Emergency Department (HOSPITAL_COMMUNITY)
Admission: EM | Admit: 2012-05-31 | Discharge: 2012-06-03 | Disposition: A | Discharge status: Other Acute Inpatient Hospital | Payer: PRIVATE HEALTH INSURANCE | Attending: Emergency Medicine | Admitting: Emergency Medicine

## 2012-05-31 ENCOUNTER — Ambulatory Visit (HOSPITAL_COMMUNITY)
Admission: RE | Admit: 2012-05-31 | Discharge: 2012-05-31 | Disposition: A | Discharge status: Home / Self Care | Payer: PRIVATE HEALTH INSURANCE | Source: Ambulatory Visit | Attending: Psychiatry | Admitting: Psychiatry

## 2012-05-31 DIAGNOSIS — R51 Headache: Secondary | ICD-10-CM | POA: Insufficient documentation

## 2012-05-31 DIAGNOSIS — T1500XA Foreign body in cornea, unspecified eye, initial encounter: Secondary | ICD-10-CM | POA: Insufficient documentation

## 2012-05-31 DIAGNOSIS — Z79899 Other long term (current) drug therapy: Secondary | ICD-10-CM | POA: Insufficient documentation

## 2012-05-31 DIAGNOSIS — F411 Generalized anxiety disorder: Secondary | ICD-10-CM

## 2012-05-31 DIAGNOSIS — F1410 Cocaine abuse, uncomplicated: Secondary | ICD-10-CM | POA: Insufficient documentation

## 2012-05-31 DIAGNOSIS — F319 Bipolar disorder, unspecified: Secondary | ICD-10-CM | POA: Insufficient documentation

## 2012-05-31 DIAGNOSIS — X58XXXA Exposure to other specified factors, initial encounter: Secondary | ICD-10-CM | POA: Insufficient documentation

## 2012-05-31 DIAGNOSIS — S0500XA Injury of conjunctiva and corneal abrasion without foreign body, unspecified eye, initial encounter: Secondary | ICD-10-CM

## 2012-05-31 DIAGNOSIS — IMO0002 Reserved for concepts with insufficient information to code with codable children: Secondary | ICD-10-CM | POA: Insufficient documentation

## 2012-05-31 DIAGNOSIS — H109 Unspecified conjunctivitis: Secondary | ICD-10-CM | POA: Insufficient documentation

## 2012-05-31 DIAGNOSIS — F29 Unspecified psychosis not due to a substance or known physiological condition: Secondary | ICD-10-CM

## 2012-05-31 DIAGNOSIS — H53149 Visual discomfort, unspecified: Secondary | ICD-10-CM | POA: Insufficient documentation

## 2012-05-31 DIAGNOSIS — S058X9A Other injuries of unspecified eye and orbit, initial encounter: Secondary | ICD-10-CM | POA: Insufficient documentation

## 2012-05-31 DIAGNOSIS — F1994 Other psychoactive substance use, unspecified with psychoactive substance-induced mood disorder: Secondary | ICD-10-CM

## 2012-05-31 DIAGNOSIS — H10219 Acute toxic conjunctivitis, unspecified eye: Secondary | ICD-10-CM | POA: Insufficient documentation

## 2012-05-31 LAB — CBC WITH DIFFERENTIAL/PLATELET
Basophils Absolute: 0 10*3/uL (ref 0.0–0.1)
Basophils Absolute: 0 10*3/uL (ref 0.0–0.1)
Basophils Relative: 0 % (ref 0–1)
Basophils Relative: 0 % (ref 0–1)
Eosinophils Absolute: 0 10*3/uL (ref 0.0–0.7)
Eosinophils Absolute: 0.1 10*3/uL (ref 0.0–0.7)
Eosinophils Relative: 0 % (ref 0–5)
Eosinophils Relative: 2 % (ref 0–5)
HCT: 35.2 % — ABNORMAL LOW (ref 36.0–46.0)
HCT: 40.6 % (ref 36.0–46.0)
Hemoglobin: 12.5 g/dL (ref 12.0–15.0)
Hemoglobin: 14 g/dL (ref 12.0–15.0)
Lymphocytes Relative: 8 % — ABNORMAL LOW (ref 12–46)
Lymphocytes Relative: 23 % (ref 12–46)
Lymphs Abs: 1.2 10*3/uL (ref 0.7–4.0)
Lymphs Abs: 2 10*3/uL (ref 0.7–4.0)
MCH: 31.6 pg (ref 26.0–34.0)
MCH: 31 pg (ref 26.0–34.0)
MCHC: 35.5 g/dL (ref 30.0–36.0)
MCHC: 34.5 g/dL (ref 30.0–36.0)
MCV: 88.9 fL (ref 78.0–100.0)
MCV: 90 fL (ref 78.0–100.0)
Monocytes Absolute: 1 10*3/uL (ref 0.1–1.0)
Monocytes Absolute: 0.6 10*3/uL (ref 0.1–1.0)
Monocytes Relative: 7 % (ref 3–12)
Monocytes Relative: 7 % (ref 3–12)
Neutro Abs: 12.4 10*3/uL — ABNORMAL HIGH (ref 1.7–7.7)
Neutro Abs: 5.9 10*3/uL (ref 1.7–7.7)
Neutrophils Relative %: 85 % — ABNORMAL HIGH (ref 43–77)
Neutrophils Relative %: 69 % (ref 43–77)
Platelets: 212 10*3/uL (ref 150–400)
Platelets: 230 10*3/uL (ref 150–400)
RBC: 3.96 MIL/uL (ref 3.87–5.11)
RBC: 4.51 MIL/uL (ref 3.87–5.11)
RDW: 12.7 % (ref 11.5–15.5)
RDW: 12.8 % (ref 11.5–15.5)
WBC: 14.6 10*3/uL — ABNORMAL HIGH (ref 4.0–10.5)
WBC: 8.6 10*3/uL (ref 4.0–10.5)

## 2012-05-31 LAB — RAPID URINE DRUG SCREEN, HOSP PERFORMED
Amphetamines: NOT DETECTED
Barbiturates: NOT DETECTED
Benzodiazepines: NOT DETECTED
Cocaine: POSITIVE — AB
Opiates: NOT DETECTED
Tetrahydrocannabinol: NOT DETECTED

## 2012-05-31 LAB — COMPREHENSIVE METABOLIC PANEL
ALT: 30 U/L (ref 0–35)
ALT: 31 U/L (ref 0–35)
AST: 38 U/L — ABNORMAL HIGH (ref 0–37)
AST: 37 U/L (ref 0–37)
Albumin: 4.5 g/dL (ref 3.5–5.2)
Albumin: 4.3 g/dL (ref 3.5–5.2)
Alkaline Phosphatase: 50 U/L (ref 39–117)
Alkaline Phosphatase: 52 U/L (ref 39–117)
BUN: 11 mg/dL (ref 6–23)
BUN: 9 mg/dL (ref 6–23)
CO2: 23 mEq/L (ref 19–32)
CO2: 26 mEq/L (ref 19–32)
Calcium: 10 mg/dL (ref 8.4–10.5)
Calcium: 10 mg/dL (ref 8.4–10.5)
Chloride: 103 mEq/L (ref 96–112)
Chloride: 102 mEq/L (ref 96–112)
Creatinine, Ser: 0.77 mg/dL (ref 0.50–1.10)
Creatinine, Ser: 0.69 mg/dL (ref 0.50–1.10)
GFR calc Af Amer: >90 mL/min (ref >90)
GFR calc Af Amer: >90 mL/min (ref >90)
GFR calc non Af Amer: >90 mL/min (ref >90)
GFR calc non Af Amer: >90 mL/min (ref >90)
Glucose, Bld: 96 mg/dL (ref 70–99)
Glucose, Bld: 86 mg/dL (ref 70–99)
Potassium: 3.3 mEq/L — ABNORMAL LOW (ref 3.5–5.1)
Potassium: 3.2 mEq/L — ABNORMAL LOW (ref 3.5–5.1)
Sodium: 139 mEq/L (ref 135–145)
Sodium: 140 mEq/L (ref 135–145)
Total Bilirubin: 0.5 mg/dL (ref 0.3–1.2)
Total Bilirubin: 0.4 mg/dL (ref 0.3–1.2)
Total Protein: 7.4 g/dL (ref 6.0–8.3)
Total Protein: 7.7 g/dL (ref 6.0–8.3)

## 2012-05-31 LAB — URINALYSIS, ROUTINE W REFLEX MICROSCOPIC
Bilirubin Urine: NEGATIVE
Glucose, UA: NEGATIVE mg/dL
Hgb urine dipstick: NEGATIVE
Ketones, ur: 15 mg/dL — AB
Leukocytes, UA: NEGATIVE
Nitrite: NEGATIVE
Protein, ur: NEGATIVE mg/dL
Specific Gravity, Urine: 1.024 (ref 1.005–1.030)
Urobilinogen, UA: 0.2 mg/dL (ref 0.0–1.0)
pH: 7.5 (ref 5.0–8.0)

## 2012-05-31 LAB — ETHANOL
Alcohol, Ethyl (B): <11 mg/dL (ref 0–11)
Alcohol, Ethyl (B): <11 mg/dL (ref 0–11)

## 2012-05-31 LAB — ACETAMINOPHEN LEVEL: Acetaminophen (Tylenol), Serum: <15 ug/mL (ref 10–30)

## 2012-05-31 LAB — SALICYLATE LEVEL: Salicylate Lvl: 2 mg/dL — ABNORMAL LOW (ref 2.8–20.0)

## 2012-05-31 LAB — PREGNANCY, URINE: Preg Test, Ur: NEGATIVE

## 2012-05-31 MED ORDER — ARTIFICIAL TEARS OP OINT
TOPICAL_OINTMENT | OPHTHALMIC | Status: DC
  Filled 2012-05-31: qty 3.5

## 2012-05-31 MED ORDER — LORAZEPAM 1 MG PO TABS: 1.0000 mg | ORAL_TABLET | Freq: Three times a day (TID) | ORAL | Status: DC | PRN

## 2012-05-31 MED ORDER — NICOTINE 21 MG/24HR TD PT24
21.0000 mg | MEDICATED_PATCH | Freq: Every day | TRANSDERMAL | Status: DC
  Filled 2012-05-31: qty 1

## 2012-05-31 MED ORDER — ACETAMINOPHEN 325 MG PO TABS
650.0000 mg | ORAL_TABLET | ORAL | Status: DC | PRN
  Administered 2012-06-01: 650 mg via ORAL
  Filled 2012-05-31: qty 2

## 2012-05-31 MED ORDER — ONDANSETRON HCL 4 MG PO TABS: 4.0000 mg | ORAL_TABLET | Freq: Three times a day (TID) | ORAL | Status: DC | PRN

## 2012-05-31 MED ORDER — ACETAMINOPHEN 325 MG PO TABS: 650.0000 mg | ORAL_TABLET | ORAL | Status: DC | PRN

## 2012-05-31 MED ORDER — ZOLPIDEM TARTRATE 5 MG PO TABS: 5.0000 mg | ORAL_TABLET | Freq: Every evening | ORAL | Status: DC | PRN

## 2012-05-31 MED ORDER — FLUORESCEIN SODIUM 1 MG OP STRP
ORAL_STRIP | OPHTHALMIC | Status: AC
  Administered 2012-05-31: 2
  Filled 2012-05-31: qty 2

## 2012-05-31 MED ORDER — ALUM & MAG HYDROXIDE-SIMETH 200-200-20 MG/5ML PO SUSP: 30.0000 mL | ORAL | Status: DC | PRN

## 2012-05-31 MED ORDER — OXYCODONE-ACETAMINOPHEN 5-325 MG PO TABS
1.0000 | ORAL_TABLET | ORAL | Status: DC | PRN
  Administered 2012-06-01 – 2012-06-02 (×7): 1 via ORAL
  Filled 2012-05-31 (×7): qty 1

## 2012-05-31 MED ORDER — POLYVINYL ALCOHOL 1.4 % OP SOLN
1.0000 [drp] | OPHTHALMIC | Status: DC
  Administered 2012-05-31 (×9): 1 [drp] via OPHTHALMIC
  Filled 2012-05-31: qty 15

## 2012-05-31 MED ORDER — TRAZODONE HCL 100 MG PO TABS
100.0000 mg | ORAL_TABLET | Freq: Every day | ORAL | Status: DC
  Administered 2012-05-31 – 2012-06-02 (×3): 100 mg via ORAL
  Filled 2012-05-31 (×2): qty 1

## 2012-05-31 MED ORDER — TOBRAMYCIN-DEXAMETHASONE 0.3-0.1 % OP OINT
TOPICAL_OINTMENT | Freq: Three times a day (TID) | OPHTHALMIC | Status: DC
  Administered 2012-05-31 (×2): via OPHTHALMIC
  Filled 2012-05-31: qty 3.5

## 2012-05-31 MED ORDER — LORAZEPAM 2 MG/ML IJ SOLN
0.5000 mg | Freq: Once | INTRAMUSCULAR | Status: AC
  Administered 2012-05-31: 0.5 mg via INTRAVENOUS
  Filled 2012-05-31: qty 1

## 2012-05-31 MED ORDER — OXYCODONE-ACETAMINOPHEN 5-325 MG PO TABS: 1.0000 | ORAL_TABLET | ORAL | Status: DC | PRN

## 2012-05-31 MED ORDER — TOBRAMYCIN-DEXAMETHASONE 0.3-0.1 % OP SUSP
2.0000 [drp] | Freq: Four times a day (QID) | OPHTHALMIC | Status: DC
  Administered 2012-05-31 – 2012-06-01 (×2): 2 [drp] via OPHTHALMIC
  Filled 2012-05-31: qty 2.5

## 2012-05-31 MED ORDER — TRAZODONE HCL 100 MG PO TABS
ORAL_TABLET | ORAL | Status: AC
  Filled 2012-05-31: qty 1

## 2012-05-31 MED ORDER — ARIPIPRAZOLE 15 MG PO TABS
15.0000 mg | ORAL_TABLET | Freq: Every day | ORAL | Status: DC
  Administered 2012-06-01 – 2012-06-02 (×2): 15 mg via ORAL
  Filled 2012-05-31 (×2): qty 1

## 2012-05-31 MED ORDER — TOBRAMYCIN-DEXAMETHASONE 0.3-0.1 % OP SUSP: 2.0000 [drp] | Freq: Four times a day (QID) | OPHTHALMIC | Status: DC

## 2012-05-31 MED ORDER — GABAPENTIN 300 MG PO CAPS
1800.0000 mg | ORAL_CAPSULE | Freq: Every day | ORAL | Status: DC
  Administered 2012-05-31 – 2012-06-02 (×3): 1800 mg via ORAL
  Filled 2012-05-31 (×3): qty 6

## 2012-05-31 MED ORDER — LORAZEPAM 1 MG PO TABS
2.0000 mg | ORAL_TABLET | Freq: Two times a day (BID) | ORAL | Status: DC | PRN
  Administered 2012-06-01 – 2012-06-02 (×3): 2 mg via ORAL
  Filled 2012-05-31 (×3): qty 2

## 2012-05-31 MED ORDER — IBUPROFEN 200 MG PO TABS: 600.0000 mg | ORAL_TABLET | Freq: Three times a day (TID) | ORAL | Status: DC | PRN

## 2012-05-31 NOTE — ED Notes (Signed)
Took pt a drink
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
## 2012-05-31 NOTE — BH Assessment (Signed)
[facility] Assessment Progress Note      Consulted with Dr. Devoria Albe as pt's mother has concerns about pt. The pt's mother's concerns are the same concerns that are noted by EMS. Pt has been cleared for d/c by Dr. Elsie Saas with the recommendation to f/u with current psychiatrist and was given outpatient referrals for outpatient therapy and substance abuse. Dr. Lynelle Doctor reports that Dr. Manus Gunning will speak with pt's mother prior to d/c home.

## 2012-05-31 NOTE — ED Notes (Signed)
Bilateral eye pH 7.5

## 2012-05-31 NOTE — ED Notes (Signed)
Pt can't go back to Psy ED at the moment because pt can't open her eyes therefore not independent enough to go back to psy. Pt is however qualify for TCU. TCU is full at this time. Pending for a room at TCU to open. Pt will be staying in room 21 until then.
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
## 2012-05-31 NOTE — ED Notes (Signed)
Pt complete 1 liter of NS for each eye. Dr. Nicanor Alcon rechecked pt's pH in both eyes with a bilateral result of 7.0
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
## 2012-05-31 NOTE — ED Notes (Signed)
Per EMS: pt woke up around 1 with an intense headache then pt covered herself with comet and borax. EMS states pt was having intense feelings of things crawling on her skin, something in her ear, and something in her head. Pt is uncooperative. Unknown ETOH or drug use. Mom at bedside
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
## 2012-05-31 NOTE — ED Provider Notes (Signed)
History     CSN: [id number]  Arrival date & time 05/31/12  0419   First MD Initiated Contact with Patient 05/31/12 [phone]      Chief Complaint  Patient presents with  . Chemical exposure     (Consider location/radiation/quality/duration/timing/severity/associated sxs/prior treatment) Patient is a 41 y.o. female presenting with mental health disorder. The history is provided by a parent. The history is limited by the condition of the patient (psychosis). No language interpreter was used.  Mental Health Problem The primary symptoms include dysphoric mood. The current episode started more than 1 month ago. This is a recurrent problem.  Precipitated by: unknown. The degree of incapacity that she is experiencing as a consequence of her illness is moderate. Additional symptoms of the illness include patient exhibits inattention/distractibility and poor judgment. Additional symptoms of the illness do not include no appetite change, no unexpected weight change, no flight of ideas, no inflated self-esteem, no abdominal pain or no seizures. She does not admit to suicidal ideas. She does not have a plan to commit suicide. She does not contemplate harming herself. She has already injured self. She does not contemplate injuring another person. Risk factors that are present for mental illness include a history of mental illness.  Has been feeling worms under the skin and having Morgellan problems.  Then coated self this evening in t fal shampoo borax comet cleanser and menthol linament.    Past Medical History  Diagnosis Date  . Depression   . Bipolar 1 disorder     History reviewed. No pertinent past surgical history.  History reviewed. No pertinent family history.  History  Substance Use Topics  . Smoking status: Never Smoker   . Smokeless tobacco: Not on file  . Alcohol Use: No    OB History    Grav Para Term Preterm Abortions TAB SAB Ect Mult Living                  Review of Systems    Unable to perform ROS Constitutional: Negative for appetite change and unexpected weight change.  Eyes: Positive for photophobia and redness.  Gastrointestinal: Negative for abdominal pain.  Neurological: Negative for seizures.  Psychiatric/Behavioral: Positive for self-injury and dysphoric mood.  All other systems reviewed and are negative.    Allergies  Review of patient's allergies indicates no known allergies.  Home Medications   Current Outpatient Rx  Name Route Sig Dispense Refill  . ARIPIPRAZOLE 15 MG PO TABS Oral Take 15 mg by mouth daily.    Marland Kitchen GABAPENTIN 600 MG PO TABS Oral Take 1,800 mg by mouth at bedtime.     Marland Kitchen LORAZEPAM 1 MG PO TABS Oral Take 1 mg by mouth 2 (two) times daily as needed. For anxiety    . TRAZODONE HCL 100 MG PO TABS Oral Take 100 mg by mouth at bedtime.        BP 111/64  Pulse 83  Temp 98.1 F (36.7 C) (Oral)  Resp 18  SpO2 98%  Physical Exam  Constitutional: She appears well-developed and well-nourished.  HENT:  Head:    Right Ear: External ear normal.  Left Ear: External ear normal.  Mouth/Throat: Oropharynx is clear and moist.  Eyes: Pupils are equal, round, and reactive to light.       Injected sclera  Neck: Normal range of motion. Neck supple.  Cardiovascular: Normal rate and regular rhythm.   Pulmonary/Chest: Effort normal and breath sounds normal. She has no wheezes. She has no  rales.  Abdominal: Soft. Bowel sounds are normal. There is no tenderness. There is no rebound and no guarding.  Musculoskeletal: Normal range of motion. She exhibits no edema.  Skin: Skin is warm and dry.  Psychiatric: She expresses no homicidal and no suicidal ideation.       psychotic    ED Course  Procedures (including critical care time)  Labs Reviewed  CBC WITH DIFFERENTIAL - Abnormal; Notable for the following:    WBC 14.6 (*)     HCT 35.2 (*)     Neutrophils Relative 85 (*)     Lymphocytes Relative 8 (*)     Neutro Abs 12.4 (*)     All  other components within normal limits  COMPREHENSIVE METABOLIC PANEL - Abnormal; Notable for the following:    Potassium 3.3 (*)     AST 38 (*)     All other components within normal limits  SALICYLATE LEVEL - Abnormal; Notable for the following:    Salicylate Lvl 2.0 (*)     All other components within normal limits  ETHANOL  ACETAMINOPHEN LEVEL  URINE RAPID DRUG SCREEN (HOSP PERFORMED)  URINALYSIS, ROUTINE W REFLEX MICROSCOPIC  PREGNANCY, URINE   No results found.   No diagnosis found.    MDM  [employer] notified B eye irrigated x 1 liter.  Suspect HA is due to hair pulled out and chemical exposure to scalp.  Patient was immediately decontaminated on arrival.  Will need inpatient therapy  Irrigated with Morgen lens to a pH of 7.  Stained has corneal abrasion at 9 oclock position of right eye  712 case d/w Dr. Luciana Axe of ophtho apply tobradex tid.  Artifical tears q1 hr while awake.  If still having problems call ophtho for someone to come see the ophtho back to be seen     Tamy Accardo K Daking Westervelt-Rasch, MD 05/31/12 [phone]

## 2012-05-31 NOTE — ED Provider Notes (Signed)
Margaret Mccann, ACT States Dr Elsie Saas has seen patient and can be dischared.  Assessment/Plan:  Substance-induced mood/anxiety disorder  Cocaine intoxication.  Patient does not meet criteria for acute psychiatric hospitalization. Patient will be discharged to the outpatient psychiatric services at Dr. Dub Mikes office, especially substance abuse treatment. Patient will continue her current medication Abilify 15 mg daily dropping team 600 mg 2 times daily and trazodone. 100 mg at bedtime, and Ativan 1 mg Q8 hours as needed. For anxiety. Patient was instructed stay away from cocaine abuse. Dr Elsie Saas  Pt rechecked, still has a lot of red skin on her face. She still has a lot of photophobia and injected conjunctiva. States her mother could take her to the ophthalmologist office today.   12:14 Dr Guy Begin, states to have her continue the tobradex 4 times a day over the weekend and be seen in the office on Monday if not improving.    Devoria Albe, MD, Armando Gang   Ward Givens, MD 05/31/12 1224

## 2012-05-31 NOTE — ED Notes (Signed)
Poison control called, and has cleared this patient.

## 2012-05-31 NOTE — BH Assessment (Deleted)
[facility] Assessment Progress Note      Consulted with Dr. Lynelle Doctor about pt's mother's concerns. Dr. Lynelle Doctor reports that  Dr. Manus Gunning will speak with pt's mother prior to d/c.  Pt's mother reports the same concerns that were noted per EMS. Pt has pt cleared for D/C by psychiatrist Dr. Elsie Saas.

## 2012-05-31 NOTE — ED Notes (Signed)
Discharge instructions explained to pt's mother and given to pt's mother as well as prescriptions.  Pt's mother made aware pt has been cleared for discharge.  Mother verbalized understanding of discharge instructions and that pt had been cleared for discharge.  Pt's mother given a list of outpatient resources to seek further assisstance.

## 2012-05-31 NOTE — ED Notes (Signed)
Mom: [phone] (home), [phone] (Cell)

## 2012-05-31 NOTE — ED Notes (Signed)
Pt states she is here because she was seeing bugs and they were crawling on her. Pt denies seeing any or any crawling on her at this time.

## 2012-05-31 NOTE — Consult Note (Signed)
Reason for Consult: depression, anxiety and cocaine intoxication Referring Physician: Dr. Carmelia Bake is an 41 y.o. female.  HPI: Patient was seen and chart reviewed. Patient is known to this provider from her previous [facility] emergency department visit about 2 weeks ago. Patient has been suffering with unknown emotional problems and also drug of abuse which is cocaine. Patient has been seeing Dr. Dub Mikes at his private office, taking medications. Gabapentin trazodone, Abilify, and Ativan. Patient was presented today emergency department with the chemical exposure to her eyes and also reportedly has a panic attack. Patient urine drug screen was positive for cocaine, which patient denied using it and the stated she last used about 8 weeks ago. Patient has a, urine drug screen positive for cocaine. He when her last visit to the emergency department 2 weeks ago. Patient argues that her cocaine does not lost in her system for 8 weeks. Reportedly patient snorted cocaine when she used it last time. Patient lives alone with the her children half of the time, and rest of the time states with her ex-husband. Patient denied current suicidal or homicidal ideation, intentions, or plans. Patient does not have a appetite, symptoms of psychosis, hallucinations, delusions, or paranoia.  Past Medical History  Diagnosis Date  . Depression   . Bipolar 1 disorder     History reviewed. No pertinent past surgical history.  History reviewed. No pertinent family history.  Social History:  reports that she has never smoked. She does not have any smokeless tobacco history on file. She reports that she does not drink alcohol or use illicit drugs.  Allergies: No Known Allergies  Medications: I have reviewed the patient's current medications.  Results for orders placed during the hospital encounter of 05/31/12 (from the past 48 hour(s))  CBC WITH DIFFERENTIAL     Status: Abnormal   Collection Time   05/31/12   5:30 AM      Component Value Range Comment   WBC 14.6 (*) 4.0 - 10.5 K/uL    RBC 3.96  3.87 - 5.11 MIL/uL    Hemoglobin 12.5  12.0 - 15.0 g/dL    HCT 16.1 (*) 09.6 - 46.0 %    MCV 88.9  78.0 - 100.0 fL    MCH 31.6  26.0 - 34.0 pg    MCHC 35.5  30.0 - 36.0 g/dL    RDW 04.5  40.9 - 81.1 %    Platelets 212  150 - 400 K/uL    Neutrophils Relative 85 (*) 43 - 77 %    Lymphocytes Relative 8 (*) 12 - 46 %    Monocytes Relative 7  3 - 12 %    Eosinophils Relative 0  0 - 5 %    Basophils Relative 0  0 - 1 %    Neutro Abs 12.4 (*) 1.7 - 7.7 K/uL    Lymphs Abs 1.2  0.7 - 4.0 K/uL    Monocytes Absolute 1.0  0.1 - 1.0 K/uL    Eosinophils Absolute 0.0  0.0 - 0.7 K/uL    Basophils Absolute 0.0  0.0 - 0.1 K/uL    RBC Morphology ELLIPTOCYTES      WBC Morphology MILD LEFT SHIFT (1-5% METAS, OCC MYELO, OCC BANDS)   ATYPICAL LYMPHOCYTES  COMPREHENSIVE METABOLIC PANEL     Status: Abnormal   Collection Time   05/31/12  5:30 AM      Component Value Range Comment   Sodium 139  135 - 145 mEq/L  Potassium 3.3 (*) 3.5 - 5.1 mEq/L    Chloride 103  96 - 112 mEq/L    CO2 23  19 - 32 mEq/L    Glucose, Bld 96  70 - 99 mg/dL    BUN 11  6 - 23 mg/dL    Creatinine, Ser 1.61  0.50 - 1.10 mg/dL    Calcium 09.6  8.4 - 10.5 mg/dL    Total Protein 7.4  6.0 - 8.3 g/dL    Albumin 4.5  3.5 - 5.2 g/dL    AST 38 (*) 0 - 37 U/L    ALT 30  0 - 35 U/L    Alkaline Phosphatase 50  39 - 117 U/L    Total Bilirubin 0.5  0.3 - 1.2 mg/dL    GFR calc non Af Amer >90  >90 mL/min    GFR calc Af Amer >90  >90 mL/min   ETHANOL     Status: Normal   Collection Time   05/31/12  5:30 AM      Component Value Range Comment   Alcohol, Ethyl (B) <11  0 - 11 mg/dL   ACETAMINOPHEN LEVEL     Status: Normal   Collection Time   05/31/12  5:30 AM      Component Value Range Comment   Acetaminophen (Tylenol), Serum <15.0  10 - 30 ug/mL   SALICYLATE LEVEL     Status: Abnormal   Collection Time   05/31/12  5:30 AM      Component Value Range  Comment   Salicylate Lvl 2.0 (*) 2.8 - 20.0 mg/dL   URINE RAPID DRUG SCREEN (HOSP PERFORMED)     Status: Abnormal   Collection Time   05/31/12  7:02 AM      Component Value Range Comment   Opiates NONE DETECTED  NONE DETECTED    Cocaine POSITIVE (*) NONE DETECTED    Benzodiazepines NONE DETECTED  NONE DETECTED    Amphetamines NONE DETECTED  NONE DETECTED    Tetrahydrocannabinol NONE DETECTED  NONE DETECTED    Barbiturates NONE DETECTED  NONE DETECTED   URINALYSIS, ROUTINE W REFLEX MICROSCOPIC     Status: Abnormal   Collection Time   05/31/12  7:02 AM      Component Value Range Comment   Color, Urine YELLOW  YELLOW    APPearance CLOUDY (*) CLEAR    Specific Gravity, Urine 1.024  1.005 - 1.030    pH 7.5  5.0 - 8.0    Glucose, UA NEGATIVE  NEGATIVE mg/dL    Hgb urine dipstick NEGATIVE  NEGATIVE    Bilirubin Urine NEGATIVE  NEGATIVE    Ketones, ur 15 (*) NEGATIVE mg/dL    Protein, ur NEGATIVE  NEGATIVE mg/dL    Urobilinogen, UA 0.2  0.0 - 1.0 mg/dL    Nitrite NEGATIVE  NEGATIVE    Leukocytes, UA NEGATIVE  NEGATIVE MICROSCOPIC NOT DONE ON URINES WITH NEGATIVE PROTEIN, BLOOD, LEUKOCYTES, NITRITE, OR GLUCOSE <1000 mg/dL.  PREGNANCY, URINE     Status: Normal   Collection Time   05/31/12  7:02 AM      Component Value Range Comment   Preg Test, Ur NEGATIVE  NEGATIVE     Ct Head Wo Contrast  05/31/2012  *RADIOLOGY REPORT*  Clinical Data: severe headache  CT HEAD WITHOUT CONTRAST  Technique:  Contiguous axial images were obtained from the base of the skull through the vertex without contrast.  Comparison: None.  Findings: There is no evidence for acute hemorrhage,  hydrocephalus, mass lesion, or abnormal extra-axial fluid collection.  No definite CT evidence of acute infarct.  Bone windows show the visualized portions of the paranasal sinuses to be clear.  IMPRESSION: Normal exam.   Original Report Authenticated By: ERIC A. MANSELL, M.D.     No psychosis and Positive for anxiety, bad mood,  bipolar, depression, illegal drug usage, sleep disturbance and cocaine intoxication. Blood pressure 91/56, pulse 74, temperature 98.5 F (36.9 C), temperature source Oral, resp. rate 15, last menstrual period 05/17/2012, SpO2 100.00%.   Assessment/Plan: Substance-induced mood/anxiety disorder Cocaine intoxication.  Patient does not meet criteria for acute psychiatric hospitalization. Patient will be discharged to the outpatient psychiatric services at Dr. Dub Mikes office, especially substance abuse treatment. Patient will continue her current medication Abilify 15 mg daily dropping team 600 mg 2 times daily and trazodone. 100 mg at bedtime, and Ativan 1 mg Q8 hours as needed. For anxiety. Patient was instructed stay away from cocaine abuse.  Lisseth Brazeau,JANARDHAHA R. 05/31/2012, 11:34 AM

## 2012-05-31 NOTE — ED Notes (Signed)
Pt states that last night she felt as if worms and parasites were on her skin and they are attacking her. Denies any SI or HI, auditory or visual disturbances. Pt laying in chair rocking back and forth.

## 2012-05-31 NOTE — ED Notes (Signed)
Pt now in [place]

## 2012-06-01 LAB — URINALYSIS, ROUTINE W REFLEX MICROSCOPIC
Bilirubin Urine: NEGATIVE
Glucose, UA: NEGATIVE mg/dL
Ketones, ur: NEGATIVE mg/dL
Leukocytes, UA: NEGATIVE
Nitrite: NEGATIVE
Protein, ur: NEGATIVE mg/dL
Specific Gravity, Urine: 1.04 — ABNORMAL HIGH (ref 1.005–1.030)
Urobilinogen, UA: 1 mg/dL (ref 0.0–1.0)
pH: 6 (ref 5.0–8.0)

## 2012-06-01 LAB — RAPID URINE DRUG SCREEN, HOSP PERFORMED
Amphetamines: POSITIVE — AB
Barbiturates: NOT DETECTED
Benzodiazepines: NOT DETECTED
Cocaine: POSITIVE — AB
Opiates: NOT DETECTED
Tetrahydrocannabinol: NOT DETECTED

## 2012-06-01 LAB — URINE MICROSCOPIC-ADD ON

## 2012-06-01 LAB — PREGNANCY, URINE: Preg Test, Ur: NEGATIVE

## 2012-06-01 MED ORDER — POTASSIUM CHLORIDE CRYS ER 20 MEQ PO TBCR
40.0000 meq | EXTENDED_RELEASE_TABLET | Freq: Once | ORAL | Status: AC
  Administered 2012-06-01: 40 meq via ORAL
  Filled 2012-06-01: qty 2

## 2012-06-01 MED ORDER — TOBRAMYCIN-DEXAMETHASONE 0.3-0.1 % OP OINT
TOPICAL_OINTMENT | Freq: Four times a day (QID) | OPHTHALMIC | Status: DC
  Administered 2012-06-01 – 2012-06-02 (×6): via OPHTHALMIC
  Filled 2012-06-01: qty 3.5

## 2012-06-01 MED ORDER — BACITRACIN ZINC 500 UNIT/GM EX OINT
TOPICAL_OINTMENT | Freq: Two times a day (BID) | CUTANEOUS | Status: DC
  Administered 2012-06-01: 14:00:00 via TOPICAL
  Filled 2012-06-01: qty 15

## 2012-06-01 MED ORDER — BACITRACIN ZINC 500 UNIT/GM EX OINT
TOPICAL_OINTMENT | Freq: Two times a day (BID) | CUTANEOUS | Status: DC
  Administered 2012-06-01 – 2012-06-02 (×3): via TOPICAL
  Filled 2012-06-01: qty 15

## 2012-06-01 MED ORDER — ARTIFICIAL TEARS OP OINT
TOPICAL_OINTMENT | OPHTHALMIC | Status: DC | PRN
  Filled 2012-06-01: qty 3.5

## 2012-06-01 NOTE — ED Notes (Signed)
Attempted to get pt to urinate but pt states she did not have to go at this time.

## 2012-06-01 NOTE — ED Notes (Signed)
Family at bedside. 
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
## 2012-06-01 NOTE — ED Notes (Signed)
Pt asked what time night time meds are given and who would be her new nurse. Mom gave pt a bed bath today since we are unsure of the amount of visibility and safety pt would have in a shower plus pt has beliefs about parasites being in her hair and getting on the rest of her body. Mom asked if pt was still waiting for a bed at [facility] and was told yes but it may be a long wait.
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
## 2012-06-02 NOTE — ED Provider Notes (Signed)
Patient resting comfortably. No issues as per nursing. Pending inpatient psych placement.   Richardean Canal, MD 06/02/12 [phone]

## 2012-06-02 NOTE — ED Notes (Signed)
Talked with dietary services about patient being vegetarian. Patient states does not have a soy allergy just dislikes soy and Tonya, RN agreed that patient has no known allergies.  Talked with service response and ordered tray.

## 2012-06-02 NOTE — ED Notes (Signed)
MOTHER'S CELL CONTACT  NUMBER [phone]

## 2012-06-02 NOTE — ED Notes (Signed)
Mom called in to let pt know that she is on her way up here to visit her.

## 2012-06-02 NOTE — ED Notes (Signed)
Pt came up to nursing station asking for a snack.

## 2012-06-02 NOTE — ED Notes (Signed)
Pt asked if she missed a percocet today. Explained that they aren't scheduled at certain times she has to ask for them. She said it's hard without having a clock to go by.

## 2012-06-02 NOTE — ED Notes (Signed)
Mom in room with pt. Pt ate about half of her dinner and said she will eat the rest when she calms down she is currently sad, upset and crying. Ativan given as ordered. Pt VS stable. Pt said when she calms down she will finish eating let her know we can heat up her food if she needs [place] to do so for her. Encouraged pt to drink the pitcher of water provided for her and to allow her eyes to at least start adjusting to the TV.

## 2012-06-03 ENCOUNTER — Inpatient Hospital Stay (HOSPITAL_COMMUNITY)
Admission: RE | Admit: 2012-06-03 | Discharge: 2012-06-07 | DRG: 897 | Disposition: A | Discharge status: Home / Self Care | Payer: Federal, State, Local not specified - Other | Source: Ambulatory Visit | Attending: Psychiatry | Admitting: Psychiatry

## 2012-06-03 ENCOUNTER — Encounter (HOSPITAL_COMMUNITY): Payer: Self-pay | Admitting: *Deleted

## 2012-06-03 DIAGNOSIS — F1210 Cannabis abuse, uncomplicated: Secondary | ICD-10-CM

## 2012-06-03 DIAGNOSIS — F1510 Other stimulant abuse, uncomplicated: Secondary | ICD-10-CM | POA: Diagnosis present

## 2012-06-03 DIAGNOSIS — F1994 Other psychoactive substance use, unspecified with psychoactive substance-induced mood disorder: Secondary | ICD-10-CM

## 2012-06-03 DIAGNOSIS — F1410 Cocaine abuse, uncomplicated: Secondary | ICD-10-CM | POA: Diagnosis present

## 2012-06-03 DIAGNOSIS — F19951 Other psychoactive substance use, unspecified with psychoactive substance-induced psychotic disorder with hallucinations: Principal | ICD-10-CM | POA: Diagnosis present

## 2012-06-03 DIAGNOSIS — F15951 Other stimulant use, unspecified with stimulant-induced psychotic disorder with hallucinations: Secondary | ICD-10-CM | POA: Diagnosis present

## 2012-06-03 HISTORY — DX: Anxiety disorder, unspecified: F41.9

## 2012-06-03 LAB — COMPREHENSIVE METABOLIC PANEL
ALT: 19 U/L (ref 0–35)
AST: 18 U/L (ref 0–37)
Albumin: 3.8 g/dL (ref 3.5–5.2)
Alkaline Phosphatase: 49 U/L (ref 39–117)
BUN: 8 mg/dL (ref 6–23)
CO2: 29 mEq/L (ref 19–32)
Calcium: 9.6 mg/dL (ref 8.4–10.5)
Chloride: 104 mEq/L (ref 96–112)
Creatinine, Ser: 0.61 mg/dL (ref 0.50–1.10)
GFR calc Af Amer: >90 mL/min (ref >90)
GFR calc non Af Amer: >90 mL/min (ref >90)
Glucose, Bld: 106 mg/dL — ABNORMAL HIGH (ref 70–99)
Potassium: 3.8 mEq/L (ref 3.5–5.1)
Sodium: 141 mEq/L (ref 135–145)
Total Bilirubin: 0.2 mg/dL — ABNORMAL LOW (ref 0.3–1.2)
Total Protein: 6.9 g/dL (ref 6.0–8.3)

## 2012-06-03 MED ORDER — NICOTINE 21 MG/24HR TD PT24
21.0000 mg | MEDICATED_PATCH | Freq: Every day | TRANSDERMAL | Status: DC
  Filled 2012-06-03 (×4): qty 1

## 2012-06-03 MED ORDER — TRAZODONE HCL 100 MG PO TABS
100.0000 mg | ORAL_TABLET | Freq: Every day | ORAL | Status: DC
  Administered 2012-06-03 – 2012-06-06 (×4): 100 mg via ORAL
  Filled 2012-06-03: qty 14
  Filled 2012-06-03 (×6): qty 1
  Filled 2012-06-03: qty 14

## 2012-06-03 MED ORDER — ACETAMINOPHEN 325 MG PO TABS: 650.0000 mg | ORAL_TABLET | Freq: Four times a day (QID) | ORAL | Status: DC | PRN

## 2012-06-03 MED ORDER — GABAPENTIN 300 MG PO CAPS
1800.0000 mg | ORAL_CAPSULE | Freq: Every day | ORAL | Status: DC
  Administered 2012-06-03 – 2012-06-06 (×4): 1800 mg via ORAL
  Filled 2012-06-03 (×6): qty 6

## 2012-06-03 MED ORDER — BACITRACIN 500 UNIT/GM EX OINT
1.0000 "application " | TOPICAL_OINTMENT | Freq: Two times a day (BID) | CUTANEOUS | Status: DC
  Administered 2012-06-03 – 2012-06-07 (×9): 1 via TOPICAL
  Filled 2012-06-03 (×13): qty 0.9

## 2012-06-03 MED ORDER — OXYCODONE-ACETAMINOPHEN 5-325 MG PO TABS: 1.0000 | ORAL_TABLET | ORAL | Status: DC | PRN

## 2012-06-03 MED ORDER — MAGNESIUM HYDROXIDE 400 MG/5ML PO SUSP: 30.0000 mL | Freq: Every day | ORAL | Status: DC | PRN

## 2012-06-03 MED ORDER — ALUM & MAG HYDROXIDE-SIMETH 200-200-20 MG/5ML PO SUSP: 30.0000 mL | ORAL | Status: DC | PRN

## 2012-06-03 MED ORDER — CITALOPRAM HYDROBROMIDE 20 MG PO TABS
20.0000 mg | ORAL_TABLET | Freq: Every day | ORAL | Status: DC
  Administered 2012-06-03 – 2012-06-05 (×3): 20 mg via ORAL
  Filled 2012-06-03 (×7): qty 1

## 2012-06-03 MED ORDER — ARTIFICIAL TEARS OP OINT
TOPICAL_OINTMENT | OPHTHALMIC | Status: DC | PRN
  Administered 2012-06-03: 13:00:00 via OPHTHALMIC
  Filled 2012-06-03 (×2): qty 3.5

## 2012-06-03 MED ORDER — POTASSIUM CHLORIDE CRYS ER 10 MEQ PO TBCR
10.0000 meq | EXTENDED_RELEASE_TABLET | ORAL | Status: AC
  Administered 2012-06-03 – 2012-06-06 (×6): 10 meq via ORAL
  Filled 2012-06-03 (×8): qty 1

## 2012-06-03 MED ORDER — ARIPIPRAZOLE 15 MG PO TABS
15.0000 mg | ORAL_TABLET | Freq: Every day | ORAL | Status: DC
  Administered 2012-06-03 – 2012-06-07 (×5): 15 mg via ORAL
  Filled 2012-06-03: qty 1
  Filled 2012-06-03: qty 14
  Filled 2012-06-03 (×4): qty 1
  Filled 2012-06-03: qty 14
  Filled 2012-06-03: qty 1

## 2012-06-03 MED ORDER — TOBRAMYCIN-DEXAMETHASONE 0.3-0.1 % OP OINT
TOPICAL_OINTMENT | Freq: Four times a day (QID) | OPHTHALMIC | Status: DC
  Administered 2012-06-03 – 2012-06-07 (×14): via OPHTHALMIC
  Filled 2012-06-03 (×2): qty 3.5

## 2012-06-03 NOTE — Progress Notes (Signed)
D: Has been in her room for entire shift apart from med pass. Appears flat and depressed. Calm and cooperative with assessment. No acute distress noted. States she has had a rough day. States she did not attend groups since she was new and tired. Feels like she needs ativan for her anxiety as she used that before coming in. Denies SI/HI/AVH and contracts for safety.  A: Safety has been maintained with Q15 minute observation. Support and encouragement provided. POC and medications for the shift reviewed and understanding verbalized. Meds given per MD order. Encouraged to talk with MD in AM about her medications for anxiety and see what his opinion is. Encouraged her to attend groups tomorrow as she did not attend any today.   R: Pt remains safe. She is flat, depressed and isolative. States she intends to attend groups tomorrow. She is complaint with meds. She offers no additional questions or concerns. Will continue Q15 min observation and continue current POC.

## 2012-06-03 NOTE — Progress Notes (Signed)
Psychoeducational Group Note  Date:  06/03/2012 Time:  2000  Group Topic/Focus:  Goals Group:   The focus of this group is to help patients establish daily goals to achieve during treatment and discuss how the patient can incorporate goal setting into their daily lives to aide in recovery.  Participation Level:  Did Not Attend  Participation Quality:  Did not attend group  Affect:  Appropriate  Cognitive:  Appropriate  Insight:  None  Engagement in Group:  None  Additional Comments:  Patient was asked to come to group, but declined.   Lyndee Hensen 06/03/2012, 10:13 PM
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
## 2012-06-03 NOTE — Tx Team (Signed)
Initial Interdisciplinary Treatment Plan  PATIENT STRENGTHS: (choose at least two) Average or above average intelligence Capable of independent living General fund of knowledge Motivation for treatment/growth Supportive family/friends  PATIENT STRESSORS: Marital or family conflict Traumatic event stress of 50-50 custody of her children   PROBLEM LIST: Problem List/Patient Goals Date to be addressed Date deferred Reason deferred Estimated date of resolution  Anxiety      Visual/tactile hallucinations of worms/parasites on her skin      Chemical irritation to her eyes      Stress of custody arrangement                                     DISCHARGE CRITERIA:  Ability to meet basic life and health needs Improved stabilization in mood, thinking, and/or behavior Motivation to continue treatment in a less acute level of care Need for constant or close observation no longer present Safe-care adequate arrangements made Verbal commitment to aftercare and medication compliance  PRELIMINARY DISCHARGE PLAN: Attend aftercare/continuing care group Attend PHP/IOP Outpatient therapy Return to previous living arrangement  PATIENT/FAMIILY INVOLVEMENT: This treatment plan has been presented to and reviewed with the patient, Margaret Mccann, and/or family member.  The patient and family have been given the opportunity to ask questions and make suggestions.  Jesus Genera [facility] 06/03/2012, 2:17 AM

## 2012-06-03 NOTE — Tx Team (Signed)
Interdisciplinary Treatment Plan Update (Adult) Date: 06/03/2012  Time Reviewed: 11:04 AM  Progress in Treatment: Attending groups: Yes Participating in groups: Yes Taking medication as prescribed: Yes Tolerating medication: Yes Family/Significant othe contact made: Counselor to contact patient's mom. Patient understands diagnosis: Yes Discussing patient identified problems/goals with staff: Yes Medical problems stabilized or resolved: Yes Denies suicidal/homicidal ideation: Yes Issues/concerns per patient self-inventory: None identified Other: N/A New problem(s) identified: None Identified Reason for Continuation of Hospitalization: Anxiety Depression Hallucinations Medication stabilization Suicidal ideation Interventions implemented related to continuation of hospitalization: mood stabilization, medication monitoring and adjustment, group therapy and psycho education, safety checks q 15 mins Additional comments: N/A Estimated length of stay:  Discharge Plan: SW is assessing for appropriate referrals.  New goal(s): N/A Review of initial/current patient goals per problem list:  1. Goal(s): Reduce depressive symptoms  Met: No  Target date: by discharge  As evidenced by: Reducing depression from a 10 to a 3 as reported by pt.  2. Goal (s): Eliminate Suicidal Ideation  Met: No  Target date: by discharge  As evidenced by: Eliminate suicidal ideation.  3. Goal(s): Reduce Psychosis  Met: No  Target date: by discharge  As evidenced by: Reduce psychotic symptoms to baseline, as reported by pt.  4. Goal(s): Reduce Anxiety Symptoms   Met: No  Target date: by discharge  As evidenced by:  Reducing Anxiety from to 3 or less as reported by patient. Attendees: Patient: Margaret Mccann 06/03/2012 11:04 AM  Family:    Physician: Franchot Gallo, MD 06/03/2012 11:04 AM   Nursing:    Case Manager: Clarice Pole, LCASA 06/03/2012 11:04 AM   Counselor: Veto Kemps, MT-BC 06/03/2012  11:04 AM   Other: Joslyn Devon, RN 06/03/2012 11:04 AM   Other:    Other:    Other:    Scribe for Treatment Team:  Clarice Pole, LCASA 06/03/2012 11:04 AM

## 2012-06-03 NOTE — [facility] Suicide Risk Assessment (Signed)
Suicide Risk Assessment  Admission Assessment     Nursing information obtained from:  Patient Demographic factors:  Divorced or widowed;Caucasian;Low socioeconomic status;Living alone;Unemployed Current Mental Status:  NA (Pt denies) Loss Factors:  Loss of significant relationship (stress of divorce and custody of children) Historical Factors:  Family history of mental illness or substance abuse Risk Reduction Factors:  Responsible for children under 30 years of age;Positive social support  CLINICAL FACTORS:   Severe Anxiety and/or Agitation Depression:   Anhedonia Comorbid alcohol abuse/dependence Hopelessness Insomnia Severe Alcohol/Substance Abuse/Dependencies More than one psychiatric diagnosis Previous Psychiatric Diagnoses and Treatments Medical Diagnoses and Treatments/Surgeries  COGNITIVE FEATURES THAT CONTRIBUTE TO RISK:  Thought constriction (tunnel vision)    Current Mental Status Per Physician:  Diagnosis:  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  The patient was seen today and reports the following:   ADL's: Intact.  Sleep: The patient reports to having significant difficulty initiating and maintaining sleep.  Appetite: The patient reports that his appetite is decreased.   Mild>(1-10) >Severe  Hopelessness (1-10): 9  Depression (1-10): 8  Anxiety (1-10): 8   Suicidal Ideation: The patient denies any current suicidal ideations.  Plan: No  Intent: No  Means: No   Homicidal Ideation: The patient denies any homicidal ideations today.  Plan: No  Intent: No.  Means: No   General Appearance/Behavior: The patient was cooperative today with this provider but appeared significantly depressed.  Eye Contact: Good.  Speech: Appropriate in rate and volume with no pressuring of speech noted today.  Motor Behavior: wnl.  Level of Consciousness: Alert and Oriented x 3.  Mental Status:  Alert and Oriented x 3.  Mood: Severely Depressed.  Affect: Essentially Flat.  Anxiety Level: Severe anxiety reported today.  Thought Process: wnl.  Thought Content: The patient denies any current auditory or visual hallucinations or delusions thinking.  Perception: wnl.  Judgment: Fair.  Insight: Fair.  Cognition: Oriented to person, place and time.   Current Medications:     . ARIPiprazole  15 mg Oral Daily  . bacitracin  1 application Topical BID  . gabapentin  1,800 mg Oral QHS  . nicotine  21 mg Transdermal Q0600  . potassium chloride  10 mEq Oral BH-qamhs  . tobramycin-dexamethasone   Both Eyes Q6H  . traZODone  100 mg Oral QHS   Review of Systems:  Neurological: No headaches, seizures or dizziness reported.  G.I.: The patient denies any constipation or stomach upset today.  Musculoskeletal: The patient denies any musculoskeletal issues today.   Time was spent today discussing with the patient her current symptoms. The patient reports to having difficulty initiating and maintaining sleep.  The patient also reports a decreased appetite.  Margaret Mccann reports moderate to severe feelings of sadness, anhedonia and depressed mood and denies any current suicidal or homicidal ideations.  She reports moderate to severe anxiety symptoms as well as severe feelings of hopelessness.  The patient denies any current auditory or visual hallucinations or delusional thinking, but prior to admission states she was attacked by a swarm of flies.  She denies any medication related side effects or other concerns today.  Treatment Plan Summary:  1. Daily contact with patient to assess and evaluate symptoms and progress in treatment.  2. Medication management  3. The patient will deny suicidal ideations or homicidal ideations for 48 hours prior to discharge and have a depression and anxiety rating of 3 or less. The  patient will also deny any auditory or visual hallucinations or delusional thinking.  4.  The patient will deny any symptoms of substance withdrawal at time of discharge.   Plan:  1. Will restart the patient on the medication Abilify at 15 mgs po q am for psychosis. 2. Will restart the patient on the medication Neurontin at 1800 mgs po qhs for sleep and mood stabilization.  3. Will start the patient on the medication Celexa at 20 mgs po q am for depression and anxiety.  4. Will restart the medication Trazodone at 100 mgs po qhs for sleep.  5. Laboratory Studies reviewed.  6. Will start KCL 10 mEq po q am and hs x 6 doses for hypokalemia. 7. Will continue to monitor.   SUICIDE RISK:  Mild:  Suicidal ideation of limited frequency, intensity, duration, and specificity.  There are no identifiable plans, no associated intent, mild dysphoria and related symptoms, good self-control (both objective and subjective assessment), few other risk factors, and identifiable protective factors, including available and accessible social support.  Margaret Mccann 06/03/2012, 5:14 PM

## 2012-06-03 NOTE — Progress Notes (Signed)
06/03/2012         Time: 0930      Group Topic/Focus: The focus of this group is on enhancing patients' problem solving skills, which involves identifying the problem, brainstorming solutions and choosing and trying a solution.  Participation Level: Did not attend  Participation Quality: Not Applicable  Affect: Not Applicable  Cognitive: Not Applicable   Additional Comments: Patient sleeping due to early morning admission.   Arno Cullers 06/03/2012 10:03 AM

## 2012-06-03 NOTE — Progress Notes (Signed)
Patient has been isolative today; has not been attending groups.  She denies any SI/HI/AVH.  Her UDS is positive for cocaine and amphedamines.  Her left eye is irritated from the chemical introduction.  She requested to wash her hair as she still has borax and comet in her hair and was afraid getting more in her eyes.  She is cooperative with staff; has minimal contact.    Continue to monitor medication management and MD orders.  Continue to 15 minute safety checks.  Collaborate with treatment team regarding patient's POC.  Patient interacts minimally with staff; her behavior is appropriate.  Possibly move to the 300 hall due substance abuse.

## 2012-06-03 NOTE — Discharge Planning (Signed)
06/03/2012  Pt did not attend d/c planning group on this date. SW met with pt individually at this time.   SW found patient sees Dr. Dub Mikes.  SW to schedule follow-up appointments. SW to continue to assess.  Clarice Pole, LCASA 06/03/2012, 11:09 AM

## 2012-06-03 NOTE — H&P (Signed)
Psychiatric Admission Assessment Adult  Patient Identification:  Margaret Mccann Date of Evaluation:  06/03/2012 Chief Complaint:  "I got attacked by insects with wings that flew into my eyes and my female parts while I was in the bathroom." History of Present Illness:: Pt is a 41 year old who states about 4 weeks ago she states she was evaluated at the [facility] ER for similar complaints (hallucinations with bugs/worms), but was released after observation and not admitted.  She has had a recent history of anhedonia and hopelessness for some time, but denies appetite changes, isolation, and crying.  According to pt, on Friday  she was in her home when she was attacked by a dragonfly that fell from the ceiling landing on her hair. The dragonfly had other smaller insects on its wings, which were deposited in her hair when the dragonfly fell. The insects that live on her ceiling have various stages from flies to worms.  Pt sprinkled comet cleaning powder on the floor and in her hair to keep the bugs away.  Comet fell accidentally from her into her eyes during the excitement. during the interaction.  and sprinkled in herAfter the initial assault, she called her mother who subsequently called 911 who then brought patient to [facility] ER where she was evaluated then involuntarily committed at [facility]. Pt denies SI/HI.  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 Foy Mccann.  She stated that Margaret Mccann diagnosed who after she complained of experiencing problems with breathing, walking, night blindness/loss of vision, tremors, fatigue, cognitive dysfunction/short term memory loss, numbness/tingling of hands and feet. Mood Symptoms:  Depression, Depression Symptoms:  impaired memory, anxiety, loss of energy/fatigue, (Hypo) Manic Symptoms:  Delusions, Anxiety Symptoms:  Excessive Worry, Psychotic Symptoms:  Paranoia, Tactile/Visual hallucinations  PTSD  Symptoms: Denies childhood trauma, abuse or exposure to violence    Past Psychiatric History:Per pt, she began seeing psychiatrist as a young adult d/t "mild depression and perfectionism". She was subsequently prescribed prozac. She has a history of being treated by 3 psychiatrist in the [place] area: Dr's Margaret Mccann and Margaret Mccann, and most recently Dr. Dub Mccann.  About 4 weeks ago she states she was evaluated at the [facility] ER for similar complaints (hallucinations with bugs/worms), but was released after observation and not admitted.  Diagnosis: Hx of depression  Hospitalizations: no previous psych hospitalizations  Outpatient Care: Dr. Dub Mccann, outpatient psychiatrist  Substance Abuse Care: none per patient  Self-Mutilation: Pt denies  Suicidal Attempts:Pt denies  Violent Behaviors: None per patient.   Past Medical History:   Past Medical History  Diagnosis Date  . Appendectomy   .    Marland Kitchen     Loss of Consciousness:  No history of LOC; Denies history of seizures; Denies history of head injury. Allergies:  Lamictal- causes rash. PTA Medications: Prescriptions prior to admission  Medication Sig Dispense Refill  . ARIPiprazole (ABILIFY) 15 MG tablet Take 15 mg by mouth daily.      Marland Kitchen gabapentin (NEURONTIN) 600 MG tablet Take 1,800 mg by mouth at bedtime.       Marland Kitchen LORazepam (ATIVAN) 1 MG tablet Take 2 mg by mouth 2 (two) times daily as needed. For anxiety      . oxyCODONE-acetaminophen (PERCOCET/ROXICET) 5-325 MG per tablet Take 1 tablet by mouth every 4 (four) hours as needed. Pain      . tobramycin-dexamethasone (TOBRADEX) ophthalmic solution Place 2 drops into both eyes 4 (four)  times daily.      . traZODone (DESYREL) 100 MG tablet Take 100 mg by mouth at bedtime.         Previous Psychotropic Medications:  Medication/Dose  prozac- dc d/t "no longer needing"  ambien- dc d/t "worried about addiction"             Substance Abuse History in the last 12 months: Denies any current of previous  use of nicotine, alcohol, or any other drugs. However, drug toxicology positive for cocaine and methamphetamines. Substance Age of 1st Use Last Use Amount Specific Type  Nicotine      Alcohol      Cannabis      Opiates      Cocaine  Patient stated 6 to 8 weeks, however positive in urine 05/31/12 6 lines snorted Powder inhaled  Methamphetamines   Positive in Urine 05/31/12 Mixed with cocaine per pt inhaled  LSD      Ecstasy      Benzodiazepines      Caffeine      Inhalants      Others:                        Consequences of drug use: Legal: Incarceration, loss of privileges; loss of custody of children Medical: Cardiac and other physical maladies; addiction Social: Loss of familial connections/ Poor family relationships.  Social History: Current Place of Residence:  Pt currently lives in an apartment by herself, which she has lived for 3 years. She has 50% custody of her 2 children.  Place of Birth:  Margaret Mccann, [place] Family Members: Pt is close to children, mother, and stepfather. Pt's biological father has health issues, but they have a relationship. Marital Status:  Was married x 12 years. Divorced X 3 years. Currently not in a relationship. Children:  Sons:1 age 36 years  Daughters: 1 age 87 yrs Relationships: Has 50% custody of biological children, but shares custody with ex husband. 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  Educational Problems/Performance:Pt graduate "suma cum laude" per pt. 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 Legal History:None Hobbies/Interests:  Family History:  History reviewed. No pertinent family history.  ROS: Negative with the exception of HPI.   PE: PE completed by MD in  ED.  Mental Status  Examination/Evaluation: Objective:  Appearance: Thin, underweight, disheveled lying in bed  Eye Contact::  Poor  Speech:  Clear and Coherent  Volume:  Normal  Mood:  Anxious, Depressed and Irritable  Affect:  Flat  Thought Process:  Coherent, Goal Directed and ; however delusional  Orientation:  Other:  Oriented X 2, confused regarding date/day of week  Thought Content:  Delusions and Hallucinations: Tactile Visual  Suicidal Thoughts:  No  Homicidal Thoughts:  No  Memory:  Immediate;   Fair Recent;   Fair Remote;   Poor  Judgement:  Impaired  Insight:  Lacking  Psychomotor Activity:  EPS and finger snapping noted  Concentration:  Poor  Recall:  Fair  Akathisia:  No  Handed:  Right  AIMS (if indicated):     Assets:  Communication Skills Housing Others:  educational level  Sleep:  Number of Hours: 3.5     Laboratory/X-Ray Psychological Evaluation(s)  Urine drug screen- Positive for cocaine and methylamphetamines  Assessment:    AXIS I:  Rule out Substance induced Mood Disorder; Generalized Anxiety Disorder; Hx. Of Major Depression, Recurrent severe,  AXIS II:  Deferred AXIS III:  Healthy; History of appendectomy  AXIS IV:  other psychosocial or environmental problems and problems related to social environment AXIS V:  21-30 behavior considerably influenced by delusions or hallucinations OR serious impairment in judgment, communication OR inability to function in almost all areas  Treatment Plan/Recommendations: 1. Admit for crisis management and stabilization. 2. Medication management to reduce current symptoms to base line and improve the     patient's overall level of functioning 3. Treat health problems as indicated. 4. Develop treatment plan to decrease risk of relapse upon discharge and the need for     readmission. 5. Psycho-social education regarding relapse prevention and self care. 6. Health care follow up as needed for medical problems. 7. Restart home  medications where appropriate.   Treatment Plan Summary: Daily contact with patient to assess and evaluate symptoms and progress in treatment Medication management  Current Medications:   Current Facility-Administered Medications  Medication Dose Route Frequency Provider Last Rate Last Dose  . acetaminophen (TYLENOL) tablet 650 mg  650 mg Oral Q6H PRN Jorje Guild, PA-C      . alum & mag hydroxide-simeth (MAALOX/MYLANTA) 200-200-20 MG/5ML suspension 30 mL  30 mL Oral Q4H PRN Jorje Guild, PA-C      . ARIPiprazole (ABILIFY) tablet 15 mg  15 mg Oral Daily Jorje Guild, PA-C   15 mg at 06/03/12 0807  . artificial tears (LACRILUBE) ophthalmic ointment   Both Eyes Q4H PRN Jorje Guild, PA-C      . bacitracin ointment 1 application  1 application Topical BID Jorje Guild, PA-C   1 application at 06/03/12 [phone]  . gabapentin (NEURONTIN) capsule 1,800 mg  1,800 mg Oral QHS Jorje Guild, PA-C      . magnesium hydroxide (MILK OF MAGNESIA) suspension 30 mL  30 mL Oral Daily PRN Jorje Guild, PA-C      . nicotine (NICODERM CQ - dosed in mg/24 hours) patch 21 mg  21 mg Transdermal Q0600 Jorje Guild, PA-C      . oxyCODONE-acetaminophen (PERCOCET/ROXICET) 5-325 MG per tablet 1 tablet  1 tablet Oral Q4H PRN Jorje Guild, PA-C      . tobramycin-dexamethasone [facility]) ophthalmic ointment   Both Eyes Q6H Jorje Guild, PA-C      . traZODone (DESYREL) tablet 100 mg  100 mg Oral QHS Jorje Guild, PA-C       Facility-Administered Medications Ordered in Other Encounters  Medication Dose Route Frequency Provider Last Rate Last Dose  . DISCONTD: acetaminophen (TYLENOL) tablet 650 mg  650 mg Oral Q4H PRN Glynn Octave, MD   650 mg at 06/01/12 1213  . DISCONTD: ARIPiprazole (ABILIFY) tablet 15 mg  15 mg Oral Daily Glynn Octave, MD   15 mg at 06/02/12 1026  . DISCONTD: artificial tears (LACRILUBE) ophthalmic ointment   Both Eyes Q4H PRN Richardean Canal, MD      . DISCONTD: bacitracin ointment   Topical BID Richardean Canal, MD      . DISCONTD:  gabapentin (NEURONTIN) capsule 1,800 mg  1,800 mg Oral QHS Glynn Octave, MD   1,800 mg at 06/02/12 2209  . DISCONTD: LORazepam (ATIVAN) tablet 2 mg  2 mg Oral BID PRN Glynn Octave, MD   2 mg at 06/02/12 1835  . DISCONTD: nicotine (NICODERM CQ - dosed in mg/24 hours) patch 21 mg  21 mg  Transdermal Daily Glynn Octave, MD      . DISCONTD: ondansetron [facility]) tablet 4 mg  4 mg Oral Q8H PRN Glynn Octave, MD      . DISCONTD: oxyCODONE-acetaminophen (PERCOCET/ROXICET) 5-325 MG per tablet 1 tablet  1 tablet Oral Q4H PRN Glynn Octave, MD   1 tablet at 06/02/12 2209  . DISCONTD: tobramycin-dexamethasone (TOBRADEX) ophthalmic ointment   Both Eyes Q6H Richardean Canal, MD      . DISCONTD: traZODone (DESYREL) tablet 100 mg  100 mg Oral QHS Glynn Octave, MD   100 mg at 06/02/12 2209    Observation Level/Precautions:  Q 15 minutes  Laboratory:  Chemistry Profile- indicated hypokalemia with K+ @ 3.2; UDS-+ cocaine/amphetamines; UA- moderate blood, Ca oxalate crystals. Will order f/u chem profile.  Psychotherapy:  Will obtain through groups  Medications:  See MAR. Will begin Abilify 15 mg daily.  Routine PRN Medications:  Yes  Consultations:  None at this time  Discharge Concerns:  None at this time  Other:     Norval Gable 9/9/201312:23 PM

## 2012-06-04 NOTE — Progress Notes (Signed)
Psychoeducational Group Note  Date:  06/04/2012 Time:  0930  Group Topic/Focus:  Recovery Goals:   The focus of this group is to identify appropriate goals for recovery and establish a plan to achieve them.  Participation Level: Did Not Attend  Participation Quality:  Not Applicable  Affect:  Not Applicable  Cognitive:  Not Applicable  Insight:  Not Applicable  Engagement in Group: Not Applicable  Additional Comments:  Pt refused to attend group due to fact that she did not get any sleep last night.  Apurva Reily E 06/04/2012, 11:31 AM

## 2012-06-04 NOTE — Progress Notes (Signed)
D: Has been in her room for entire shift apart from med pass. Appears flat and depressed. Calm and cooperative with assessment. No acute distress noted. States she has had a rough day. States she did not attend groups because she did not sleep well. Worried she will not sleep well again tonight. Denies SI/HI/AVH and contracts for safety. Otherwise offered no questions or concerns.  A: Safety has been maintained with Q15 minute observation. Support and encouragement provided. POC and medications for the shift reviewed and understanding verbalized. Meds given per MD order. Writer challenged her on her c/o poor sleep. Reminded her she told this writer she did not attend groups yesterday r/t feeling tired (slept all day) and she assured writer she would attend today. She again slept all day today. Encouraged her to try to get out of bed during daytime hours and attend programming so she might be tired at night and get a more restful sleep.  R: Pt remains safe. She is flat, depressed and isolative. She is complaint with meds but is not compliant with programming. She offers no additional questions or concerns. Will continue Q15 min observation and continue current POC.

## 2012-06-04 NOTE — Progress Notes (Signed)
[facility] Group Notes:  (Counselor/Nursing/MHT/Case Management/Adjunct)  06/04/2012 1:20 PM  Type of Therapy:  Group Therapy  Participation Level:  Did Not Attend Natassia Guthridge, LPCA    Cheick Suhr L 06/04/2012, 1:20 PM

## 2012-06-04 NOTE — Progress Notes (Signed)
Patient has been isolative to room most of the day.  She only comes out to get her medications.  She complains of no sleep last night; however, has been lying in her room all day.  She states that she wants to be discharged today.  The irritations in her eyes are improved, as well as her skin.  She denies any Si/HI/AVH.    Continue to monitor medication management and MD orders.  Continue with 15 minute safety checks.  Encourage patient to participate in her treatment; her behavior remains seclusive and isolative.

## 2012-06-04 NOTE — Progress Notes (Signed)
Sw attempted to talk with pt on this date.  Pt continuously said she's tired and wants to go to sleep.  Pt rates depression and anxiety at a 5 today.  Pt states that she lives in [place] alone and can return home at discharge.  When SW asked why pt was admitted to the hospital, pt states "it's a personal thing".  No further needs voiced by pt at this time.    Reyes Ivan, LCSWA 06/04/2012  1:07 PM

## 2012-06-04 NOTE — BH Assessment (Signed)
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 Amphetamine abuse   3. Cocaine abuse    The patient was seen today and reports the following:  Sleep: Not at ALL Appetite: missed a meal yesterday because of not sleeping  Mild (1-10) Severe  Depression (1-10): denies Anxiety (1-10): 0 Hopelessness (1-10): 6   Suicidal Ideation: The patient denies suicidal ideation. Plan: None Intent: None Means: None  Homicidal Ideation: The patient denies homicidal ideation. Plan: None Intent: None Means: None  Eye Contact:  minimal General Appearance: in bed and disheveled  Behavior:  Cooperative  Motor Behavior: normal Speech: normal  Mental Status:  Orientation x ? Level of Consciousness:   drowsy Mood: euthymic Affect: drowsy   Thought Process: disorganized Thought Content: tactile hallucinations Perception: impaired  Judgment: poor Insight:absent Cognition: ?  VS: height is 5\' 3"  (1.6 m) and weight is 47.174 kg (104 lb). Her oral temperature is 98.1 F (36.7 C). Her blood pressure is 94/65 and her pulse is 75. Her respiration is 18.   Current Medication:  . ARIPiprazole  15 mg Oral Daily  . bacitracin  1 application Topical BID  . citalopram  20 mg Oral Daily  . gabapentin  1,800 mg Oral QHS  . nicotine  21 mg Transdermal Q0600  . potassium chloride  10 mEq Oral BH-qamhs  . tobramycin-dexamethasone   Both Eyes Q6H  . traZODone  100 mg Oral QHS    Lab results:  Results for orders placed during the hospital encounter of 06/03/12 (from the past 48 hour(s))  COMPREHENSIVE METABOLIC PANEL     Status: Abnormal   Collection Time   06/03/12  8:01 PM      Component Value Range Comment   Sodium 141  135 - 145 mEq/L    Potassium 3.8  3.5 - 5.1 mEq/L    Chloride 104  96 - 112 mEq/L    CO2 29  19 - 32 mEq/L    Glucose, Bld 106 (*) 70 - 99 mg/dL    BUN 8  6 - 23 mg/dL    Creatinine, Ser 1.61  0.50 - 1.10 mg/dL    Calcium 9.6  8.4 - 09.6 mg/dL    Total Protein 6.9  6.0 - 8.3 g/dL    Albumin 3.8  3.5 - 5.2 g/dL    AST 18  0 - 37 U/L    ALT 19  0 - 35 U/L    Alkaline Phosphatase 49  39 - 117 U/L    Total Bilirubin 0.2 (*) 0.3 - 1.2 mg/dL    GFR calc non Af Amer >90  >90 mL/min    GFR calc Af Amer >90  >90 mL/min     No results found for this or any previous visit for  Last 48 hours. Group attendance: 0 ROS:    Constitutional: WDWN Adult in NAD   GI: Negative for N,V,D,C   Neuro: Negative for dizziness, blurred vision, visual changes, headaches   Resp: Negative for wheezing, SOB, cough   Cardio: Negative for CP, diaphoresis, fatigue   MSK: Negative for joint pain, swelling, DROM, or ambulatory difficulties.  Time was spent with the patient discussing the current symptoms and the response to treatment. Patient was unable to speak with this provider due to her drowsiness.  Treatment Summary: 1. Admit for crisis management and stabilization. 2. Medication management to reduce current symptoms to base line and improve the patient's overall level of functioning 3. Treat health problems as indicated. 4. Develop treatment plan to decrease risk of relapse upon discharge and the need for readmission. 5. Psycho-social education regarding relapse prevention and self care. 6. Health care follow up as needed for medical problems. 7. Restart home medications where appropriate.  Plan:  1. Will continue the patient on the medication Abilify at 15 mgs po q am for psychosis.  2. Will continue the patient on the medication Neurontin at 1800 mgs po qhs for sleep and mood stabilization.  3. Will continue the patient on the medication Celexa at 20 mgs po q am for depression and anxiety.  4. Will continue the medication Trazodone at 100 mgs po qhs for sleep.  5. Laboratory Studies reviewed.  6. Will start KCL  10 mEq po q am and hs x 6 doses for hypokalemia.  7. Will continue to monitor [facility] Tal[facility] Re[facility] 06/04/2012

## 2012-06-05 MED ORDER — GABAPENTIN 300 MG PO CAPS
300.0000 mg | ORAL_CAPSULE | Freq: Three times a day (TID) | ORAL | Status: DC | PRN
  Administered 2012-06-05 – 2012-06-07 (×3): 300 mg via ORAL
  Filled 2012-06-05: qty 84

## 2012-06-05 MED ORDER — ENSURE COMPLETE PO LIQD
237.0000 mL | Freq: Three times a day (TID) | ORAL | Status: DC
  Administered 2012-06-05 – 2012-06-07 (×6): 237 mL via ORAL

## 2012-06-05 MED ORDER — CITALOPRAM HYDROBROMIDE 40 MG PO TABS
40.0000 mg | ORAL_TABLET | Freq: Every day | ORAL | Status: DC
  Administered 2012-06-06 – 2012-06-07 (×2): 40 mg via ORAL
  Filled 2012-06-05 (×2): qty 1
  Filled 2012-06-05 (×2): qty 14
  Filled 2012-06-05 (×2): qty 1

## 2012-06-05 NOTE — Progress Notes (Signed)
Nutrition Consult Note  Intervention: Recommend continue Ensure Complete TID. Discussed ways to increase calorie/protein intake and reviewed sample easy to prepare meal and snack ideas. Encouraged pt to discuss alternate meal options with RN and kitchen staff to ensure proper nutritional intake during admission.    Body mass index is 18.42 kg/(m^2). Pt meets criteria for underweight based on current BMI.   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   - Pt reports her appetite has been poor since admission.  Pt reports she does not like the food here and thinks it does not taste good. Pt appeared to have been crying recently, RN stated she had expressed sadness r/t missing her children.  Pt reports her usual weight is between 106-110 pounds. Pt reports eating 3 meals/day and snacks at home. Pt reports she lives alone so sometimes that makes it difficult to eat well. Pt states she drinks at least 2 Ensure/day at home. Pt reports she has been a vegetarian her whole life but gets adequate protein from non-meat sources.   No nutrition interventions warranted at this time. If nutrition issues arise, please consult RD.   Levon Hedger MS, RD, LDN [phone] Pager [phone] After Hours Pager

## 2012-06-05 NOTE — Progress Notes (Signed)
Psychoeducational Group Note  Date:  06/05/2012 Time:  2200  Group Topic/Focus:  Wrap-Up Group:   The focus of this group is to help patients review their daily goal of treatment and discuss progress on daily workbooks.  Participation Level: Did Not Attend  Participation Quality:  Not Applicable  Affect:  Not Applicable  Cognitive:  Not Applicable  Insight:  Not Applicable  Engagement in Group: Not Applicable  Additional Comments:  Despite invitation from staff, Pt did not attend group.  [name] 06/05/2012, 6:59 AM

## 2012-06-05 NOTE — Progress Notes (Signed)
Pt complains of high anxiety this afternoon.  Specifically related to missing her children when she is here.  She tears up when speaking about this.   Prn Neurontin given for help with anxiety.  She said Dr. Dub Mikes has her on this medication during the day if she needs it for anxiety.  She said that she has also taken Vistaril before.

## 2012-06-05 NOTE — Progress Notes (Signed)
D:  Patient isolative to room this afternoon.  She says she is doing ok.  Nutritional consult ordered.  Pt denies SI/HI and A/V hallucinations.  Will continue to monitor.

## 2012-06-05 NOTE — Progress Notes (Signed)
06/05/2012         Time: 0930      Group Topic/Focus: The focus of the group is on enhancing the patients' ability to utilize positive relaxation strategies by practicing several that can be used at discharge.  Participation Level: Active  Participation Quality: Attentive  Affect: Blunted  Cognitive: Alert  Additional Comments: None.    Corean Yoshimura 06/05/2012 1:29 PM  

## 2012-06-05 NOTE — Progress Notes (Signed)
EKG done on patient and report placed in patient chart.  Pt. Mother here to visit again this evening.  Will continue to monitor.

## 2012-06-05 NOTE — Progress Notes (Signed)
Psychoeducational Group Note  Date:  06/05/2012 Time:  2000  Group Topic/Focus:  Wrap-Up Group:   The focus of this group is to help patients review their daily goal of treatment and discuss progress on daily workbooks.  Participation Level:  Minimal  Participation Quality:  Appropriate  Affect:  Flat  Cognitive:  Alert  Insight:  Limited  Engagement in Group:  Limited  Additional Comments:  Pt stated that she had a good day and enjoyed the mental health association group the most.  Cyla Haluska R 06/05/2012, 9:26 PM

## 2012-06-05 NOTE — Progress Notes (Signed)
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  Cocaine Abuse.   The patient was seen today and reports the following:   ADL's: Intact.  Sleep: The patient reports to having ongoing difficulty initiating and maintaining sleep.  Appetite: The patient reports that his appetite is decreased but improving.  Mild>(1-10) >Severe  Hopelessness (1-10): 3  Depression (1-10): 5  Anxiety (1-10): 4-5   Suicidal Ideation: The patient denies any current suicidal ideations.  Plan: No  Intent: No  Means: No   Homicidal Ideation: The patient denies any homicidal ideations today.  Plan: No  Intent: No.  Means: No   General Appearance/Behavior: The patient was cooperative today with this provider and appeared less depressed.  Eye Contact: Good.  Speech: Appropriate in rate and volume with no pressuring of speech noted today.  Motor Behavior: wnl.  Level of Consciousness: Alert and Oriented x 3.  Mental Status: Alert and Oriented x 3.  Mood: Moderately Depressed.  Affect: Moderately Constricted.  Anxiety Level: Moderate anxiety reported today.  Thought Process: wnl.  Thought Content: The patient denies any current auditory or visual hallucinations or delusions thinking.  Perception: wnl.  Judgment: Fair.  Insight: Fair.  Cognition: Oriented to person, place and time.  Sleep:  Number of Hours: 6.75    Vital Signs:Blood pressure 108/66, pulse 61, temperature 97.5 F (36.4 C), temperature source Oral, resp. rate 16, height 5\' 3"  (1.6 m), weight 47.174 kg (104 lb), last menstrual period 05/17/2012.  Current Medications: Current Facility-Administered Medications  Medication Dose Route Frequency Provider Last Rate Last Dose  . acetaminophen (TYLENOL) tablet 650 mg  650 mg Oral Q6H PRN Margaret Guild, PA-C      . alum & mag hydroxide-simeth  (MAALOX/MYLANTA) 200-200-20 MG/5ML suspension 30 mL  30 mL Oral Q4H PRN Margaret Guild, PA-C      . ARIPiprazole (ABILIFY) tablet 15 mg  15 mg Oral Daily Margaret Mccann Margaret Kenyon, MD   15 mg at 06/05/12 0824  . artificial tears (LACRILUBE) ophthalmic ointment   Both Eyes Q4H PRN Margaret Guild, PA-C      . bacitracin ointment 1 application  1 application Topical BID Margaret Bacon, MD   1 application at 06/05/12 0830  . citalopram (CELEXA) tablet 40 mg  40 mg Oral Daily Margaret Mccann D Margaret Desanctis, MD      . feeding supplement (ENSURE COMPLETE) liquid 237 mL  237 mL Oral TID BM Margaret Mccann Margaret Croston, MD   237 mL at 06/05/12 1310  . gabapentin (NEURONTIN) capsule 1,800 mg  1,800 mg Oral QHS Margaret Mccann Margaret Broce, MD   1,800 mg at 06/04/12 2130  . gabapentin (NEURONTIN) capsule 300 mg  300 mg Oral Q8H PRN Margaret Mccann Margaret Petrey, MD      . magnesium hydroxide (MILK OF MAGNESIA) suspension 30 mL  30 mL Oral Daily PRN Margaret Guild, PA-C      . oxyCODONE-acetaminophen (PERCOCET/ROXICET) 5-325 MG per tablet 1 tablet  1 tablet Oral Q4H PRN Margaret Guild, PA-C      . potassium chloride (K-DUR,KLOR-CON) CR tablet 10 mEq  10 mEq Oral BH-qamhs Margaret Mccann Margaret Casstevens, MD   10 mEq at 06/05/12 0824  . tobramycin-dexamethasone (TOBRADEX) ophthalmic ointment   Both Eyes Q6H Margaret Mccann D Margaret Mondor, MD      . traZODone (DESYREL) tablet 100 mg  100 mg Oral QHS Margaret Bacon, MD   100  mg at 06/04/12 2130  . DISCONTD: citalopram (CELEXA) tablet 20 mg  20 mg Oral Daily Margaret Mccann Margaret Odden, MD   20 mg at 06/05/12 0824  . DISCONTD: nicotine (NICODERM CQ - dosed in mg/24 hours) patch 21 mg  21 mg Transdermal Q0600 Margaret Bacon, MD       Lab Results:  Results for orders placed during the hospital encounter of 06/03/12 (from the past 48 hour(s))  COMPREHENSIVE METABOLIC PANEL     Status: Abnormal   Collection Time   06/03/12  8:01 PM      Component Value Range Comment   Sodium 141  135 - 145 mEq/L    Potassium 3.8  3.5 - 5.1 mEq/L    Chloride 104  96 - 112 mEq/L    CO2 29  19 -  32 mEq/L    Glucose, Bld 106 (*) 70 - 99 mg/dL    BUN 8  6 - 23 mg/dL    Creatinine, Ser 1.61  0.50 - 1.10 mg/dL    Calcium 9.6  8.4 - 09.6 mg/dL    Total Protein 6.9  6.0 - 8.3 g/dL    Albumin 3.8  3.5 - 5.2 g/dL    AST 18  0 - 37 U/L    ALT 19  0 - 35 U/L    Alkaline Phosphatase 49  39 - 117 U/L    Total Bilirubin 0.2 (*) 0.3 - 1.2 mg/dL    GFR calc non Af Amer >90  >90 mL/min    GFR calc Af Amer >90  >90 mL/min    Physical Findings: AIMS: Facial and Oral Movements Muscles of Facial Expression: None, normal Lips and Perioral Area: None, normal Jaw: None, normal Tongue: None, normal,Extremity Movements Upper (arms, wrists, hands, fingers): None, normal Lower (legs, knees, ankles, toes): None, normal, Trunk Movements Neck, shoulders, hips: None, normal, Overall Severity Severity of abnormal movements (highest score from questions above): None, normal Incapacitation due to abnormal movements: None, normal Patient's awareness of abnormal movements (rate only patient's report): No Awareness, Dental Status Current problems with teeth and/or dentures?: No Does patient usually wear dentures?: No   Review of Systems:  Neurological: No headaches, seizures or dizziness reported.  G.I.: The patient denies any constipation or stomach upset today.  Musculoskeletal: The patient denies any musculoskeletal issues today.   Time was spent today discussing with the patient her current symptoms. The patient reports to having ongoing difficulty initiating and maintaining sleep. The patient also reports a decreased appetite but states this is improving. [name] reports moderate feelings of sadness, anhedonia and depressed mood and denies any current suicidal or homicidal ideations. She reports moderate anxiety symptoms as well as mild feelings of hopelessness. The patient denies any current auditory or visual hallucinations or delusional thinking as well as any medication related side effects or other  concerns today.   Treatment Plan Summary:  1. Daily contact with patient to assess and evaluate symptoms and progress in treatment.  2. Medication management  3. The patient will deny suicidal ideations or homicidal ideations for 48 hours prior to discharge and have a depression and anxiety rating of 3 or less. The patient will also deny any auditory or visual hallucinations or delusional thinking.  4. The patient will deny any symptoms of substance withdrawal at time of discharge.   Plan:  1. Will continue the patient on the medication Abilify at 15 mgs po q am for psychosis.  2. Will continue the patient  on the medication Neurontin at 1800 mgs po qhs for sleep and mood stabilization.  3. Will start the patient on the medication Neurontin at 300 mgs po q 8 hours - prn for anxiety. 4. Will increase the medication Celexa to 40 mgs po q am for depression and anxiety.  5. Will continue the medication Trazodone at 100 mgs po qhs for sleep.  6. Will order Ensure 1 Can 3 times a day between meals for nutritional supplementation. 7. Laboratory Studies reviewed.  8. Will continue to monitor.   Margaret Mccann 06/05/2012, 3:21 PM

## 2012-06-05 NOTE — Progress Notes (Signed)
D: In dayroom, watching TV with peers on approach. Appears flat and depressed, but did brighten and flash a 1/2 smile during assessment. Calm and cooperative with assessment. States she had a better day today. Feels better for having stayed up and more active on day shift. States she enjoyed the group with a former psych pt and she thought his story was interesting. Feels like her eyes are feeling better and had no complaints r/t her eyes. Denies SI/HI/AVH and contracts for safety. Offered no questions or concerns.   A: Safety has been maintained with Q15 minute observation. Support and encouragement provided. POC and medications for the shift reviewed and understanding verbalized. Meds given per MD order.   R: Pt remains safe. She is flat and depressed, but did brighten slightly which is a big improvement from previous 2 shifts this Clinical research associate worked with her. She is attending groups and is participating appropriately. She is compliant with medications and treatment plan. She offers no additional questions or concerns. Will continue Q15 min observation and continue current POC.

## 2012-06-05 NOTE — Progress Notes (Addendum)
D:  Pt about on the unit today.  This morning patient has mostly stayed in her room. She has a flat affect with depressed mood.  She denies SI/HI and A/V hallucinations.  She does rate her depression as a 5/10 and hopelessness as a 3/10, anxiety rated at  4/10.  She did attend an am group this am and participated.  Reports she did not feel good earlier because blood pressure was low.  BP rechecked around lunch and it was better.  Patient mother visited a brief time around lunch and reports that patient was having a difficult time being away from her children.   Patient did attend treatment team meeting and was asking about discharge. She said that her appetite has not been good.  A:  Offered support and encouragement.  Given meds as prescribed.  Was started on ensure Both eyes looking better than reportedly on admission.  R:  Pt. receptive to staff.  Remains on q 15 minute checks for safety.  Will continue to monitor.

## 2012-06-05 NOTE — Progress Notes (Signed)
Psychoeducational Group Note  Date:  06/05/2012 Time:  1100  Group Topic/Focus:  Personal Choices and Values:   The focus of this group is to help patients assess and explore the importance of values in their lives, how their values affect their decisions, how they express their values and what opposes their expression.  Participation Level:  Did Not Attend  Participation Quality:    Affect:    Cognitive:    Insight:    Engagement in Group:    Additional Comments:  Pt refused to attend group.  Isla Pence M 06/05/2012, 1:43 PM

## 2012-06-05 NOTE — Progress Notes (Signed)
[facility] Group Notes:  (Counselor/Nursing/MHT/Case Management/Adjunct)  06/05/2012 12:41 PM  Type of Therapy:  Discharge Planning 06/04/12 Participation Level:  Did Not Attend    Margaret Mccann 06/05/2012, 12:41 PM

## 2012-06-05 NOTE — Discharge Planning (Signed)
06/05/2012  SW met with Margaret Mccann in discharge planning group.  SW found Margaret Mccann to have adequate transportation upon discharge with her mother.  SW found Margaret Mccann to need contacts made on their behalf.  SW found Margaret Mccann has current service providers, and they are Dr. Dub Mikes.  SW will continue to assess for referrals.  Clarice Pole, LCASA 06/05/2012, 6:06 PM

## 2012-06-06 DIAGNOSIS — F1410 Cocaine abuse, uncomplicated: Secondary | ICD-10-CM

## 2012-06-06 DIAGNOSIS — F1510 Other stimulant abuse, uncomplicated: Secondary | ICD-10-CM

## 2012-06-06 DIAGNOSIS — F19951 Other psychoactive substance use, unspecified with psychoactive substance-induced psychotic disorder with hallucinations: Principal | ICD-10-CM

## 2012-06-06 MED ORDER — ADULT MULTIVITAMIN W/MINERALS CH
1.0000 | ORAL_TABLET | Freq: Every day | ORAL | Status: DC
  Administered 2012-06-06 – 2012-06-07 (×2): 1 via ORAL
  Filled 2012-06-06 (×4): qty 1
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
## 2012-06-06 NOTE — Discharge Planning (Signed)
06/06/2012  Pt did not attend d/c planning group on this date. SW met with pt individually at this time.   SW met with Darleen Crocker in discharge planning group.  SW scheduled follow-up with Dr. Dub Mikes.  SW contacted pt mother and left VM inviting patient mother to attend treatment team meeting at 11 am on 06/07/12.  SW will continue to assess for referrals.  Clarice Pole, LCASA 06/06/2012, 10:59 AM

## 2012-06-06 NOTE — Progress Notes (Signed)
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  Cocaine Abuse.   The patient was seen today and reports the following:   ADL's: Intact.  Sleep: The patient reports to sleeping well without difficulty. Appetite: The patient reports that her appetite is decreased but improving.   Mild>(1-10) >Severe  Hopelessness (1-10): 0  Depression (1-10): 3  Anxiety (1-10): 4   Suicidal Ideation: The patient denies any current suicidal ideations.  Plan: No  Intent: No  Means: No   Homicidal Ideation: The patient denies any homicidal ideations today.  Plan: No  Intent: No.  Means: No   General Appearance/Behavior: The patient remained cooperative today with this provider and appeared much less depressed.  Eye Contact: Good.  Speech:  Appropriate in rate and volume with no pressuring of speech noted today.  Motor Behavior: wnl.  Level of Consciousness: Alert and Oriented x 3.  Mental Status: Alert and Oriented x 3.  Mood: Mild to moderately depressed.  Affect: Moderately constricted.  Anxiety Level: Mild to moderate anxiety reported today.  Thought Process: wnl.  Thought Content: The patient denies any current auditory or visual hallucinations or delusions thinking. She also states the delusions related to bugs and worms crawling on her has resolved. Perception: wnl.  Judgment: Fair.  Insight: Fair.  Cognition: Oriented to person, place and time.  Sleep:  Number of Hours: 5.75    Vital Signs:Blood pressure 105/76, pulse 71, temperature 98.6 F (37 C), temperature source Oral, resp. rate 17, height 5\' 3"  (1.6 m), weight 43.545 kg (96 lb), last menstrual period 05/17/2012.  Current Medications: Current Facility-Administered Medications  Medication Dose Route Frequency Provider Last Rate Last Dose  . acetaminophen (TYLENOL) tablet 650 mg  650  mg Oral Q6H PRN Jorje Guild, PA-C      . alum & mag hydroxide-simeth (MAALOX/MYLANTA) 200-200-20 MG/5ML suspension 30 mL  30 mL Oral Q4H PRN Jorje Guild, PA-C      . ARIPiprazole (ABILIFY) tablet 15 mg  15 mg Oral Daily Curlene Labrum Nell Schrack, MD   15 mg at 06/06/12 1478  . artificial tears (LACRILUBE) ophthalmic ointment   Both Eyes Q4H PRN Jorje Guild, PA-C      . bacitracin ointment 1 application  1 application Topical BID Ronny Bacon, MD   1 application at 06/06/12 [phone]  . citalopram (CELEXA) tablet 40 mg  40 mg Oral Daily Curlene Labrum Shamarra Warda, MD   40 mg at 06/06/12 2130  . feeding supplement (ENSURE COMPLETE) liquid 237 mL  237 mL Oral TID BM Curlene Labrum Raelee Rossmann, MD   237 mL at 06/06/12 1430  . gabapentin (NEURONTIN) capsule 1,800 mg  1,800 mg Oral QHS Curlene Labrum Avaline Stillson, MD   1,800 mg at 06/05/12 2109  . gabapentin (NEURONTIN) capsule 300 mg  300 mg Oral Q8H PRN Curlene Labrum Farren Landa, MD   300 mg at 06/06/12 1248  . magnesium hydroxide (MILK OF MAGNESIA) suspension 30 mL  30 mL Oral Daily PRN Jorje Guild, PA-C      . potassium chloride (K-DUR,KLOR-CON) CR tablet 10 mEq  10 mEq Oral BH-qamhs Curlene Labrum Ana Woodroof, MD   10 mEq at 06/06/12 8657  . tobramycin-dexamethasone (TOBRADEX) ophthalmic ointment   Both Eyes Q6H Deveney Bayon D Jil Penland, MD      . traZODone (DESYREL) tablet 100 mg  100 mg Oral QHS Ronny Bacon, MD   100  mg at 06/05/12 2109  . DISCONTD: citalopram (CELEXA) tablet 20 mg  20 mg Oral Daily Curlene Labrum Peggie Hornak, MD   20 mg at 06/05/12 0824  . DISCONTD: oxyCODONE-acetaminophen (PERCOCET/ROXICET) 5-325 MG per tablet 1 tablet  1 tablet Oral Q4H PRN Jorje Guild, PA-C       Lab Results: No results found for this or any previous visit (from the past 48 hour(s)).  Physical Findings: AIMS: Facial and Oral Movements Muscles of Facial Expression: None, normal Lips and Perioral Area: None, normal Jaw: None, normal Tongue: None, normal,Extremity Movements Upper (arms, wrists, hands, fingers): None, normal Lower (legs,  knees, ankles, toes): None, normal, Trunk Movements Neck, shoulders, hips: None, normal, Overall Severity Severity of abnormal movements (highest score from questions above): None, normal Incapacitation due to abnormal movements: None, normal Patient's awareness of abnormal movements (rate only patient's report): No Awareness, Dental Status Current problems with teeth and/or dentures?: No Does patient usually wear dentures?: No  CIWA:    COWS:  COWS Total Score: 3   Review of Systems:  Neurological: No headaches, seizures or dizziness reported.  G.I.: The patient denies any constipation or stomach upset today.  Musculoskeletal: The patient denies any musculoskeletal issues today.   Time was spent today discussing with the patient her current symptoms. The patient reports to sleeping very well last night.  The patient reports a decreased appetite but states this is improving. [name] reports mild to moderate feelings of sadness, anhedonia and depressed mood and denies any suicidal or homicidal ideations. She reports mild to moderate anxiety symptoms and denies any feelings of hopelessness. The patient denies any auditory or visual hallucinations or delusional thinking as well as any medication related side effects or other concerns today.  She also states the delusions related to bugs and worms crawling on her have resolved.  Treatment Plan Summary:  1. Daily contact with patient to assess and evaluate symptoms and progress in treatment.  2. Medication management  3. The patient will deny suicidal ideations or homicidal ideations for 48 hours prior to discharge and have a depression and anxiety rating of 3 or less. The patient will also deny any auditory or visual hallucinations or delusional thinking.  4. The patient will deny any symptoms of substance withdrawal at time of discharge.   Plan:  1. Will continue the patient on the medication Abilify at 15 mgs po q am for psychosis.  2. Will  continue the patient on the medication Neurontin at 1800 mgs po qhs for sleep and mood stabilization.  3. Will continue the patient on the medication Neurontin at 300 mgs po q 8 hours - prn for anxiety.  4. Will continue the medication Celexa at 40 mgs po q am for depression and anxiety.  5. Will continue the medication Trazodone at 100 mgs po qhs for sleep.  6. Will continue Ensure 1 Can 3 times a day between meals for nutritional supplementation.  7. Laboratory Studies reviewed.  8. Will continue to monitor.   Jelani Vreeland 06/06/2012, 3:03 PM

## 2012-06-06 NOTE — Progress Notes (Signed)
Psychoeducational Group Note  Date:  06/06/2012 Time:  2000  Group Topic/Focus:  karoke  Participation Level: Did Not Attend  Participation Quality:  Not Applicable  Affect:  Not Applicable  Cognitive:  Not Applicable  Insight:  Not Applicable  Engagement in Group: Not Applicable  Additional Comments:    Flonnie Hailstone 06/06/2012, 9:27 PM

## 2012-06-06 NOTE — Progress Notes (Signed)
D: Patient in day room at the beginning of the shift. Her mood and affect flat and depressed. Pt reported feeling depressed over custody issue with her husband. She came to the window for her HS medications at 2100. She has an order for Neurontine 1800 mg and Trazodone 100 mg. That looked like a very large dose to be taken at once. Writer asked patient how much she has been taken. She seemed not to be sure how much she has been taken; "4" capsules she said. A: Writer called pharmacy to verify if dosage in within normal range and also called the PA to clarify the dosage. Pharmacist somewhat concern about renal effect, but advised writer to call the doctor or the PA. PA went through the patient order and noticed patient had been receiving this medication since 06/03/12. She said even though it looks large, it's within normal dosage range and that writer should administer medication as ordered. Patient encouraged and supported. R: Pt received th medications as ordered with ensure. Q 15 minute check continues to maintain safety.

## 2012-06-06 NOTE — Progress Notes (Signed)
D:  Pt about on the unit today.  She continues to present with flat affect /depressed mood.  She has not attended all groups on the unit today and has mostly been seclusive to room.  She denies SI/HI and A/V hallucinations.  She rates her depression as a 3/10 today and anxiety as a 4/10.  She says she wants to be home for her daughters birthday party this weekend.  A:  Offered support and encouragement , Given prn for anxiety and encouraged to go to groups.  R:  Pt receptive to staff,  Will continue to monitor q 15 minutes.

## 2012-06-06 NOTE — Progress Notes (Signed)
Psychoeducational Group Note  Date:  06/06/2012 Time:  0930  Group Topic/Focus:  Rediscovering Joy:   The focus of this group is to explore various ways to relieve stress in a positive manner.  Participation Level: Did Not Attend  Participation Quality:  Not Applicable  Affect:  Not Applicable  Cognitive:  Not Applicable  Insight:  Not Applicable  Engagement in Group: Not Applicable  Additional Comments:  Pt complained of not feeling well this morning and would not attend group.  Fatoumata Albaugh E 06/06/2012, 12:35 PM

## 2012-06-07 MED ORDER — ARIPIPRAZOLE 15 MG PO TABS: 15.0000 mg | ORAL_TABLET | Freq: Every day | ORAL | Status: DC

## 2012-06-07 MED ORDER — GABAPENTIN 300 MG PO CAPS: 300.0000 mg | ORAL_CAPSULE | Freq: Three times a day (TID) | ORAL | Status: DC | PRN

## 2012-06-07 MED ORDER — CITALOPRAM HYDROBROMIDE 40 MG PO TABS: 40.0000 mg | ORAL_TABLET | Freq: Every day | ORAL | Status: DC

## 2012-06-07 MED ORDER — GABAPENTIN 300 MG PO CAPS: 1800.0000 mg | ORAL_CAPSULE | Freq: Every day | ORAL | Status: DC

## 2012-06-07 MED ORDER — ADULT MULTIVITAMIN W/MINERALS CH: 1.0000 | ORAL_TABLET | Freq: Every day | ORAL | Status: DC

## 2012-06-07 MED ORDER — TRAZODONE HCL 100 MG PO TABS: 100.0000 mg | ORAL_TABLET | Freq: Every day | ORAL | Status: DC

## 2012-06-07 NOTE — [facility] Suicide Risk Assessment (Signed)
Suicide Risk Assessment  Discharge Assessment     Demographic Factors:  Divorced or widowed, Caucasian and Unemployed  Mental Status Per Nursing Assessment::   On Admission:  NA (Pt denies) At Time of Discharge:  Time was spent today discussing with the patient her current symptoms. The patient reports to sleeping very well last night. The patient reports a decreased appetite but states this is continuing to improve. [name] reports mild feelings of sadness, anhedonia and depressed mood and adamantly denies any suicidal or homicidal ideations. She reports mild to moderate anxiety symptoms and denies any feelings of hopelessness. The patient adamantly denies any auditory or visual hallucinations or delusional thinking as well as any medication related side effects or other concerns today. She also states the delusions related to bugs and worms crawling on her have resolved.  She states she feels ready for discharge to outpatient follow up and this will be ordered.  The patient's Mother also met with the treatment team this morning and agrees with discharge today.  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  Cocaine Abuse.   The patient was seen today and reports the following:   ADL's: Intact.  Sleep: The patient reports to sleeping well without difficulty.  Appetite: The patient reports that her appetite is decreased but much improved.   Mild>(1-10) >Severe  Hopelessness (1-10): 0  Depression (1-10): 2-3  Anxiety (1-10): 4   Suicidal Ideation: The patient adamantly denies any current suicidal ideations.  Plan: No  Intent: No  Means: No   Homicidal Ideation: The patient adamantly denies any homicidal ideations today.  Plan: No  Intent: No.  Means: No   General Appearance/Behavior: The patient remained cooperative today with this provider.  Eye Contact: Good.  Speech:  Appropriate in rate and volume with no pressuring of speech noted today.  Motor Behavior: wnl.  Level of Consciousness: Alert and Oriented x 3.  Mental Status: Alert and Oriented x 3.  Mood: Mildly depressed.  Affect: Mildly constricted.  Anxiety Level: Mild to moderate anxiety reported today.  Thought Process: wnl.  Thought Content: The patient denies any auditory or visual hallucinations or delusions thinking. She also states the delusions related to bugs and worms crawling on her has resolved.  Perception: wnl.  Judgment: Fair to Good.  Insight: Fair to Good.  Cognition: Oriented to person, place and time.   Loss Factors: Marital Separation.  Historical Factors: History of Mental Illness.  History of Substance abuse.  Risk Reduction Factors:   Good Family Support.  Good access to healthcare.  Good Physical Health.  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    Amphetamine Abuse.    Cocaine Abuse.  AXIS II:   Deferred. AXIS III:  1. Recent Weight Loss. AXIS IV:   History of Mental Illness.  History of Substance abuse.  Unemployment. AXIS V:   GAF at time of admission approximately 30.  GAF at time of discharge approximately 55.  Cognitive Features That Contribute To Risk:  None Noted.  Current Medications:  Current Facility-Administered Medications   Medication  Dose  Route  Frequency  Provider  Last Rate  Last Dose   .  acetaminophen (TYLENOL)  tablet 650 mg  650 mg  Oral  Q6H PRN  Jorje Guild, PA-C     .  alum & mag hydroxide-simeth (MAALOX/MYLANTA) 200-200-20 MG/5ML suspension 30 mL  30 mL  Oral  Q4H PRN  Jorje Guild, PA-C     .   ARIPiprazole (ABILIFY) tablet 15 mg  15 mg  Oral  Daily  Curlene Labrum Kathleene Bergemann, MD   15 mg at 06/06/12 1610   .  artificial tears (LACRILUBE) ophthalmic ointment   Both Eyes  Q4H PRN  Jorje Guild, PA-C     .  bacitracin ointment 1 application  1 application  Topical  BID  Ronny Bacon, MD   1 application at 06/06/12 [id number]   .  citalopram (CELEXA) tablet 40 mg  40 mg  Oral  Daily  Curlene Labrum Donnika Kucher, MD   40 mg at 06/06/12 5409   .  feeding supplement (ENSURE COMPLETE) liquid 237 mL  237 mL  Oral  TID BM  Curlene Labrum Envi Eagleson, MD   237 mL at 06/06/12 1430   .  gabapentin (NEURONTIN) capsule 1,800 mg  1,800 mg  Oral  QHS  Curlene Labrum Lyza Houseworth, MD   1,800 mg at 06/05/12 2109   .  gabapentin (NEURONTIN) capsule 300 mg  300 mg  Oral  Q8H PRN  Curlene Labrum Ishaaq Penna, MD   300 mg at 06/06/12 1248   .  magnesium hydroxide (MILK OF MAGNESIA) suspension 30 mL  30 mL  Oral  Daily PRN  Jorje Guild, PA-C     .  potassium chloride (K-DUR,KLOR-CON) CR tablet 10 mEq  10 mEq  Oral  BH-qamhs  Curlene Labrum December Hedtke, MD   10 mEq at 06/06/12 8119   .  tobramycin-dexamethasone (TOBRADEX) ophthalmic ointment   Both Eyes  Q6H  Curlene Labrum Lan Entsminger, MD     .  traZODone (DESYREL) tablet 100 mg  100 mg  Oral  QHS  Curlene Labrum Veryl Abril, MD   100 mg at 06/05/12 2109   .  DISCONTD: citalopram (CELEXA) tablet 20 mg  20 mg  Oral  Daily  Curlene Labrum Philopater Mucha, MD   20 mg at 06/05/12 0824   .  DISCONTD: oxyCODONE-acetaminophen (PERCOCET/ROXICET) 5-325 MG per tablet 1 tablet  1 tablet  Oral  Q4H PRN  Jorje Guild, PA-C      Lab Results: No results found for this or any previous visit (from the past 48 hour(s)).  Physical Findings:  AIMS: Facial and Oral Movements  Muscles of Facial Expression: None, normal  Lips and Perioral Area: None, normal  Jaw: None, normal  Tongue: None, normal,Extremity Movements  Upper (arms, wrists, hands, fingers): None, normal  Lower (legs, knees, ankles, toes): None, normal, Trunk Movements  Neck, shoulders, hips: None, normal, Overall  Severity  Severity of abnormal movements (highest score from questions above): None, normal  Incapacitation due to abnormal movements: None, normal  Patient's awareness of abnormal movements (rate only patient's report): No Awareness, Dental Status  Current problems with teeth and/or dentures?: No  Does patient usually wear dentures?: No  CIWA:  COWS: COWS Total Score: 3   Review of Systems:  Neurological: No headaches, seizures or dizziness reported.  G.I.: The patient denies any constipation or stomach upset today.  Musculoskeletal: The patient denies any musculoskeletal issues today.   Time was spent today discussing with the patient her current symptoms. The patient reports to sleeping very well last night. The patient reports a decreased appetite but states this is  continuing to improve. [name] reports mild feelings of sadness, anhedonia and depressed mood and adamantly denies any suicidal or homicidal ideations. She reports mild to moderate anxiety symptoms and denies any feelings of hopelessness. The patient adamantly denies any auditory or visual hallucinations or delusional thinking as well as any medication related side effects or other concerns today. She also states the delusions related to bugs and worms crawling on her have resolved.  She states she feels ready for discharge to outpatient follow up and this will be ordered.  The patient's Mother also met with the treatment team this morning and agrees with discharge today.  Treatment Plan Summary:  1. Daily contact with patient to assess and evaluate symptoms and progress in treatment.  2. Medication management  3. The patient will deny suicidal ideations or homicidal ideations for 48 hours prior to discharge and have a depression and anxiety rating of 3 or less. The patient will also deny any auditory or visual hallucinations or delusional thinking.  4. The patient will deny any symptoms of substance withdrawal at time of  discharge.   Plan:  1. Will continue the patient on the medication Abilify at 15 mgs po q am for psychosis.  2. Will continue the patient on the medication Neurontin at 1800 mgs po qhs for sleep and mood stabilization.  3. Will continue the patient on the medication Neurontin at 300 mgs po q 8 hours - prn for anxiety.  4. Will continue the medication Celexa at 40 mgs po q am for depression and anxiety.  5. Will continue the medication Trazodone at 100 mgs po qhs for sleep.  6. Will continue Ensure 1 Can 3 times a day between meals for nutritional supplementation.  7. Laboratory Studies reviewed.  8. Will continue to monitor.  9. The patient will be discharged today to outpatient follow up.  Suicide Risk:  Minimal: No identifiable suicidal ideation.  Patients presenting with no risk factors but with morbid ruminations; may be classified as minimal risk based on the severity of the depressive symptoms  Plan Of Care/Follow-up recommendations:  Activity:  As tolerated. Diet:  Regular Diet.  Please supplement your diet with Ensure as discussed. Other:  Please take all medications only as directed and keep all scheduled follow up appointments.  Please return to your local Emergency Room should you have any thoughts of harming yourself or others.  Vonzella Althaus 06/07/2012, 1:26 PM

## 2012-06-07 NOTE — Progress Notes (Signed)
Pt. discharged home today.  She denies SI and HI and no A/V hallucinations.  RN reviewed discharge information and follow up appointments with her with voiced understanding.  Samples given to pt.  She obtained all belongings out of room and from locker upon leaving.

## 2012-06-07 NOTE — Discharge Planning (Signed)
06/07/2012  SW met with Margaret Mccann in discharge planning group.  Pt reported she is ready to go home.  Pt discussed thoughts about her mother joining treatment team.  Pt discussed following up with support groups, resuming outpatient therapy, continuing medication management with Dr. Dub Mikes.  Pt recommended for discharge from treatment team.  SW will continue to assess for referrals.  Clarice Pole, LCASA 06/07/2012, 4:01 PM

## 2012-06-07 NOTE — Tx Team (Signed)
Interdisciplinary Treatment Plan Update (Adult) Date: 06/07/2012  Time Reviewed: 11:51 AM  Progress in Treatment: Attending groups: Yes Participating in groups: Yes Taking medication as prescribed: Yes Tolerating medication: Yes Family/Significant othe contact made: Pt mother Pricilla Riffle in treatment team today Patient understands diagnosis: Yes Discussing patient identified problems/goals with staff: Yes Medical problems stabilized or resolved: Yes Denies suicidal/homicidal ideation: Yes Issues/concerns per patient self-inventory: None identified Other: N/A New problem(s) identified: None Identified  Interventions implemented related to continuation of hospitalization: mood stabilization, medication monitoring and adjustment, group therapy and psycho education, safety checks q 15 mins Additional comments: N/A Estimated length of stay: 06-07-12 Discharge Plan: Pt f/u with Dr. Dub Mikes for medication management and Dr. Madelaine Etienne for therapy. Pt to begin attending support groups at the [facility] and possibly inquire about the peer support program.   New goal(s): N/A Review of initial/current patient goals per problem list:  1. Goal(s): Reduce depressive symptoms   Met: Yes  Target date: by discharge   As evidenced by: Reducing depression from a 10 to a 4 as reported by pt.  2. Goal (s): Eliminate Suicidal Ideation   Met: Yes  Target date: by discharge   As evidenced by: Eliminate suicidal ideation.  3. Goal(s): Reduce Psychosis   Met: Yes  Target date: by discharge   As evidenced by: Reduce psychotic symptoms to baseline, as reported by pt. Pt reporting no delusions. 4. Goal(s): Reduce Anxiety Symptoms   Met: Yes  Target date: by discharge   As evidenced by: Reducing Anxiety from 10 to 4 or less as reported by patient.  Attendees: Patient: Margaret Mccann 06/07/2012 11:52 AM  Family:    Physician: Franchot Gallo, MD 06/07/2012 11:51 AM   Nursing:    Case  Manager: Clarice Pole, LCASA 06/07/2012 11:51 AM   Counselor: Marni Griffon, RN 06/07/2012 11:51 AM   Other: Abbie Sons, RN 06/07/2012 11:51 AM   Other:    Other:    Other:    Scribe for Treatment Team:  Clarice Pole, LCASA 06/07/2012 11:51 AM

## 2012-06-10 NOTE — Progress Notes (Signed)
Patient Discharge Instructions:  After Visit Summary (AVS):   Faxed to:  06/10/2012 Psychiatric Admission Assessment Note:   Faxed to:  06/10/2012 Suicide Risk Assessment - Discharge Assessment:   Faxed to:  06/10/2012 Faxed/Sent to the Next Level Care provider:  06/10/2012  Faxed to [facility] - Dr. Madelaine Etienne @ [phone] And to [facility] - Dr. Dub Mikes @ [phone]  Wandra Scot, 06/10/2012, 4:34 PM

## 2012-06-16 NOTE — Discharge Summary (Signed)
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  Amphetamine Abuse.  Cocaine Abuse.  AXIS II: Deferred.  AXIS III: 1. Recent Weight Loss.  AXIS IV: History of Mental Illness. History of Substance abuse. Unemployment.  AXIS V: GAF at time of admission approximately 30. GAF at time of discharge approximately 55.   Level of Care:  Inpatient Hospitalization.  Reason for Admission: Pt is a 41 year old who states about 4 weeks ago she states she was evaluated at the [facility] ER for similar complaints (hallucinations with bugs/worms), but was released after observation and not admitted. She has had a recent history of anhedonia and hopelessness for some time, but denies appetite changes, isolation, and crying. According to pt, on Friday she was in her home when she was attacked by a dragonfly that fell from the ceiling landing on her hair. The dragonfly had other smaller insects on its wings, which were deposited in her hair when the dragonfly fell. The insects that live on her ceiling have various stages from flies to worms. Pt sprinkled comet cleaning powder on the floor and in her hair to keep the bugs away. Comet fell accidentally from her into her eyes during the excitement. during the interaction. and sprinkled in herAfter the initial assault, she called her mother who subsequently called 911 who then brought patient to [facility] ER where she was evaluated then involuntarily committed at [facility]. Pt denies SI/HI.  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 Foy Mccann. She stated that Margaret Mccann diagnosed who after she complained of experiencing problems with breathing, walking, night blindness/loss of vision, tremors, fatigue, cognitive dysfunction/short term memory loss, numbness/tingling of hands and feet.  Hospital Course:   The patient attended treatment team meeting this am and met with treatment team members. The patient's symptoms, treatment plan and response to treatment was discussed. The patient endorsed that their symptoms have improved. The patient also stated that they felt stable for discharge.  They reported that from this hospital stay they had learned many coping skills.  In other to maintain their psychiatric stability, they will continue psychiatric care on an outpatient basis. They will follow-up as outlined below.  In addition they were instructed  to take all your medications as prescribed by their mental healthcare provider and to report any adverse effects and or reactions from your medicines to their outpatient provider promptly.  The patient is also instructed and cautioned to not engage in alcohol and or illegal drug use while on prescription medicines.  In the event of worsening symptoms the patient is instructed to call the crisis hotline, 911 and or go to the nearest ED for appropriate evaluation and treatment of symptoms.   Also while a patient in this hospital, the patient received medication management for his psychiatric symptoms. They were ordered and received as outlined below:    Medication List     As of 06/16/2012 10:20 PM    STOP taking these medications  gabapentin 600 MG tablet   Commonly known as: NEURONTIN      LORazepam 1 MG tablet   Commonly known as: ATIVAN      oxyCODONE-acetaminophen 5-325 MG per tablet   Commonly known as: PERCOCET/ROXICET      tobramycin-dexamethasone  ophthalmic solution   Commonly known as: TOBRADEX      TAKE these medications      Indication    ARIPiprazole 15 MG tablet   Commonly known as: ABILIFY   Take 1 tablet (15 mg total) by mouth daily. For delusional thinking.       citalopram 40 MG tablet   Commonly known as: CELEXA   Take 1 tablet (40 mg total) by mouth daily. For depression and anxiety.       gabapentin 300 MG capsule   Commonly known as: NEURONTIN   Take 6 capsules (1,800 mg total) by mouth at bedtime. For sleep and mood stabilization.       gabapentin 300 MG capsule   Commonly known as: NEURONTIN   Take 1 capsule (300 mg total) by mouth every 8 (eight) hours as needed (for Anxiety.).       multivitamin with minerals Tabs   Take 1 tablet by mouth daily. For nutritional supplementation.       traZODone 100 MG tablet   Commonly known as: DESYREL   Take 1 tablet (100 mg total) by mouth at bedtime. For sleep.        They were also enrolled in group counseling sessions and activities in which they participated actively.       Follow-up Information    Follow up with Dr. Dub Mikes. On 06/12/2012. (Appt at 11:40am with Dr. Dub Mikes)    Contact information:   [address]   [place], [place] [address]  [phone]      Follow up with Dr. Madelaine Etienne. On 06/14/2012. (Appt scheduled 4pm)    Contact information:   [facility]   Dr. Madelaine Etienne  [address]  [place], [place] [address] [phone] Office  [phone] Fax         Follow up with [facility] . (All groups are held between 10-11:30am. Merchant navy officer of Supportive Services to discuss peer support groups.)    Contact information:   [facility]  [address] [place], [place]  [phone]         Upon discharge, patient adamantly denies suicidal, homicidal ideations, auditory, visual hallucinations and or delusional thinking. They left [facility] with all personal belongings via personal transportation in no  apparent distress.  Consults:  Please see electronic medical record for details.  Significant Diagnostic Studies:  Please see electronic medical record for details.  Discharge Vitals:   Blood pressure 105/76, pulse 71, temperature 98.6 F (37 C), temperature source Oral, resp. rate 17, height 5\' 3"  (1.6 m), weight 43.545 kg (96 lb), last menstrual period 05/17/2012..  Mental Status Exam: Demographic Factors:  Divorced or widowed, Caucasian and Unemployed  Mental Status Per Nursing Assessment::  On Admission: NA (Pt denies)  At Time of Discharge: Time was spent today discussing with the patient her current symptoms. The patient reports to sleeping very well last night. The patient reports a decreased appetite but states this is continuing to improve. [name] reports mild feelings of sadness, anhedonia and depressed mood and adamantly denies any suicidal or homicidal ideations. She reports mild to moderate anxiety symptoms and denies any feelings of hopelessness. The patient adamantly  denies any auditory or visual hallucinations or delusional thinking as well as any medication related side effects or other concerns today. She also states the delusions related to bugs and worms crawling on her have resolved. She states she feels ready for discharge to outpatient follow up and this will be ordered. The patient's Mother also met with the treatment team this morning and agrees with discharge today.  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  Cocaine Abuse.  The patient was seen today and reports the following:  ADL's: Intact.  Sleep: The patient reports to sleeping well without difficulty.  Appetite: The patient reports that her appetite is decreased but much improved.  Mild>(1-10) >Severe  Hopelessness (1-10): 0  Depression (1-10): 2-3  Anxiety (1-10): 4  Suicidal Ideation: The patient  adamantly denies any current suicidal ideations.  Plan: No  Intent: No  Means: No  Homicidal Ideation: The patient adamantly denies any homicidal ideations today.  Plan: No  Intent: No.  Means: No  General Appearance/Behavior: The patient remained cooperative today with this provider.  Eye Contact: Good.  Speech: Appropriate in rate and volume with no pressuring of speech noted today.  Motor Behavior: wnl.  Level of Consciousness: Alert and Oriented x 3.  Mental Status: Alert and Oriented x 3.  Mood: Mildly depressed.  Affect: Mildly constricted.  Anxiety Level: Mild to moderate anxiety reported today.  Thought Process: wnl.  Thought Content: The patient denies any auditory or visual hallucinations or delusions thinking. She also states the delusions related to bugs and worms crawling on her has resolved.  Perception: wnl.  Judgment: Fair to Good.  Insight: Fair to Good.  Cognition: Oriented to person, place and time.  Loss Factors:  Marital Separation.  Historical Factors:  History of Mental Illness. History of Substance abuse.  Risk Reduction Factors:  Good Family Support. Good access to healthcare. Good Physical Health.  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  Amphetamine Abuse.  Cocaine Abuse.  AXIS II: Deferred.  AXIS III: 1. Recent Weight Loss.  AXIS IV: History of Mental Illness. History of Substance abuse. Unemployment.  AXIS V: GAF at time of admission approximately 30. GAF at time of discharge approximately 55.  Cognitive Features That Contribute To Risk:  None Noted.  Current Medications:  Current  Facility-Administered Medications   Medication  Dose  Route  Frequency  Provider  Last Rate  Last Dose   .  acetaminophen (TYLENOL) tablet 650 mg  650 mg  Oral  Q6H PRN  Jorje Guild, PA-C     .  alum & mag hydroxide-simeth (MAALOX/MYLANTA) 200-200-20 MG/5ML suspension 30 mL  30 mL  Oral  Q4H PRN  Jorje Guild, PA-C     .  ARIPiprazole (ABILIFY) tablet 15 mg  15 mg  Oral  Daily  Curlene Labrum Readling, MD   15 mg at 06/06/12 1610   .  artificial tears (LACRILUBE) ophthalmic ointment   Both Eyes  Q4H PRN  Jorje Guild, PA-C     .  bacitracin ointment 1 application  1 application  Topical  BID  Ronny Bacon, MD   1 application at 06/06/12 [phone]   .  citalopram (CELEXA) tablet 40 mg  40 mg  Oral  Daily  Curlene Labrum Readling, MD   40 mg at 06/06/12 4540   .  feeding supplement (ENSURE COMPLETE) liquid 237 mL  237 mL  Oral  TID BM  Curlene Labrum Readling, MD   237 mL at 06/06/12 1430   .  gabapentin (NEURONTIN) capsule 1,800 mg  1,800 mg  Oral  QHS  Curlene Labrum Readling, MD   1,800 mg at 06/05/12 2109   .  gabapentin (NEURONTIN) capsule 300 mg  300 mg  Oral  Q8H PRN  Curlene Labrum Readling, MD   300 mg at 06/06/12 1248   .  magnesium hydroxide (MILK OF MAGNESIA) suspension 30 mL  30 mL  Oral  Daily PRN  Jorje Guild, PA-C     .  potassium chloride (K-DUR,KLOR-CON) CR tablet 10 mEq  10 mEq  Oral  BH-qamhs  Curlene Labrum Readling, MD   10 mEq at 06/06/12 9811   .  tobramycin-dexamethasone (TOBRADEX) ophthalmic ointment   Both Eyes  Q6H  Curlene Labrum Readling, MD     .  traZODone (DESYREL) tablet 100 mg  100 mg  Oral  QHS  Curlene Labrum Readling, MD   100 mg at 06/05/12 2109   .  DISCONTD: citalopram (CELEXA) tablet 20 mg  20 mg  Oral  Daily  Curlene Labrum Readling, MD   20 mg at 06/05/12 0824   .  DISCONTD: oxyCODONE-acetaminophen (PERCOCET/ROXICET) 5-325 MG per tablet 1 tablet  1 tablet  Oral  Q4H PRN  Jorje Guild, PA-C     Lab Results: No results found for this or any previous visit (from the past 48 hour(s)).  Physical Findings:  AIMS: Facial and Oral  Movements  Muscles of Facial Expression: None, normal  Lips and Perioral Area: None, normal  Jaw: None, normal  Tongue: None, normal,Extremity Movements  Upper (arms, wrists, hands, fingers): None, normal  Lower (legs, knees, ankles, toes): None, normal, Trunk Movements  Neck, shoulders, hips: None, normal, Overall Severity  Severity of abnormal movements (highest score from questions above): None, normal  Incapacitation due to abnormal movements: None, normal  Patient's awareness of abnormal movements (rate only patient's report): No Awareness, Dental Status  Current problems with teeth and/or dentures?: No  Does patient usually wear dentures?: No  CIWA:  COWS: COWS Total Score: 3  Review of Systems:  Neurological: No headaches, seizures or dizziness reported.  G.I.: The patient denies any constipation or stomach upset today.  Musculoskeletal: The patient denies any musculoskeletal issues today.  Time was spent today discussing with the patient her current symptoms. The patient reports to sleeping very well last night. The patient reports a decreased appetite but states this is continuing to improve. [name] reports mild feelings of sadness, anhedonia and depressed mood and adamantly denies any suicidal or homicidal ideations. She reports mild to moderate anxiety symptoms and denies any feelings of hopelessness. The patient adamantly denies any auditory or visual hallucinations or delusional thinking as well as any medication related side effects or other concerns today. She also states the delusions related to bugs and worms crawling on her have resolved. She states she feels ready for discharge to outpatient follow up and this will be ordered. The patient's Mother also met with the treatment team this morning and agrees with discharge today.  Treatment Plan Summary:  1. Daily contact with patient to assess and evaluate symptoms and progress in treatment.  2. Medication management  3. The  patient will deny suicidal ideations or homicidal ideations for 48 hours prior to discharge and have a depression and anxiety rating of 3 or less. The patient will also deny any auditory or visual hallucinations or delusional thinking.  4. The patient will deny any symptoms of substance withdrawal at time of discharge.  Plan:  1. Will continue the patient on the medication Abilify at 15 mgs po q am for psychosis.  2. Will continue the patient on the medication Neurontin at 1800 mgs po qhs for sleep and mood stabilization.  3. Will continue the patient on the medication Neurontin at 300 mgs po q 8 hours - prn for anxiety.  4. Will continue the medication Celexa at 40 mgs po q am for depression and anxiety.  5. Will continue the medication Trazodone at 100 mgs po qhs for sleep.  6. Will continue Ensure 1 Can 3 times a day between meals for nutritional supplementation.  7. Laboratory Studies reviewed.  8. Will continue to monitor.  9. The patient will be discharged today to outpatient follow up.  Suicide Risk:  Minimal: No identifiable suicidal ideation. Patients presenting with no risk factors but with morbid ruminations; may be classified as minimal risk based on the severity of the depressive symptoms  Plan Of Care/Follow-up recommendations:  Activity: As tolerated.  Diet: Regular Diet. Please supplement your diet with Ensure as discussed.  Other: Please take all medications only as directed and keep all scheduled follow up appointments. Please return to your local Emergency Room should you have any thoughts of harming yourself or others.  Discharge destination:  Home  Is patient on multiple antipsychotic therapies at discharge:  No  Has Patient had three or more failed trials of antipsychotic monotherapy by history: N/A Recommended Plan for Multiple Antipsychotic Therapies: N/A Discharge Orders    Future Orders Please Complete By Expires   Diet - low sodium heart healthy      Increase  activity slowly      Discharge instructions      Comments:   Please take all medications only as directed and keep all scheduled follow up appointments.  Please return to your local Emergency Room should you have any thoughts of harming yourself or others.       Medication List     As of 06/16/2012 10:20 PM    STOP taking these medications         gabapentin 600 MG tablet   Commonly known as: NEURONTIN      LORazepam 1 MG tablet   Commonly known as: ATIVAN      oxyCODONE-acetaminophen 5-325 MG per tablet   Commonly known as: PERCOCET/ROXICET      tobramycin-dexamethasone ophthalmic solution   Commonly known as: TOBRADEX      TAKE these medications      Indication    ARIPiprazole 15 MG tablet   Commonly known as: ABILIFY   Take 1 tablet (15 mg total) by mouth daily. For delusional thinking.       citalopram 40 MG tablet   Commonly known as: CELEXA   Take 1 tablet (40 mg total) by mouth daily. For depression and anxiety.       gabapentin 300 MG capsule   Commonly known as: NEURONTIN   Take 6 capsules (1,800 mg total) by mouth at bedtime. For sleep and mood stabilization.       gabapentin 300 MG capsule   Commonly known as: NEURONTIN   Take 1 capsule (  300 mg total) by mouth every 8 (eight) hours as needed (for Anxiety.).       multivitamin with minerals Tabs   Take 1 tablet by mouth daily. For nutritional supplementation.       traZODone 100 MG tablet   Commonly known as: DESYREL   Take 1 tablet (100 mg total) by mouth at bedtime. For sleep.            Follow-up Information    Follow up with Dr. Dub Mikes. On 06/12/2012. (Appt at 11:40am with Dr. Dub Mikes)    Contact information:   [address]   [place], [place] [address]  [phone]      Follow up with Dr. Madelaine Etienne. On 06/14/2012. (Appt scheduled 4pm)    Contact information:   [facility]   Dr. Madelaine Etienne  [address]  [place], [place] [address] [phone] Office    [phone] Fax         Follow up with [facility] . (All groups are held between 10-11:30am. Merchant navy officer of Supportive Services to discuss peer support groups.)    Contact information:   [facility]  [address] [place], [place]  [phone]         Follow-up recommendations:   Activities: Resume typical activities Diet: Resume typical diet Other: Follow up with outpatient provider and report any side effects to out patient prescriber.  Comments:  Take all your medications as prescribed by your mental healthcare provider. Report any adverse effects and or reactions from your medicines to your outpatient provider promptly. Patient is instructed and cautioned to not engage in alcohol and or illegal drug use while on prescription medicines. In the event of worsening symptoms, patient is instructed to call the crisis hotline, 911 and or go to the nearest ED for appropriate evaluation and treatment of symptoms. Follow-up with your primary care provider for your other medical issues, concerns and or health care needs.  Signed: Franchot Gallo 06/16/2012 10:20 PM

## 2012-08-28 ENCOUNTER — Emergency Department (HOSPITAL_COMMUNITY)
Admission: EM | Admit: 2012-08-28 | Discharge: 2012-08-29 | Disposition: A | Discharge status: Home / Self Care | Payer: Medicare (Managed Care) | Attending: Emergency Medicine | Admitting: Emergency Medicine

## 2012-08-28 ENCOUNTER — Encounter (HOSPITAL_COMMUNITY): Payer: Self-pay | Admitting: Emergency Medicine

## 2012-08-28 DIAGNOSIS — F411 Generalized anxiety disorder: Secondary | ICD-10-CM | POA: Insufficient documentation

## 2012-08-28 DIAGNOSIS — R443 Hallucinations, unspecified: Secondary | ICD-10-CM | POA: Insufficient documentation

## 2012-08-28 DIAGNOSIS — F319 Bipolar disorder, unspecified: Secondary | ICD-10-CM | POA: Insufficient documentation

## 2012-08-28 DIAGNOSIS — F3289 Other specified depressive episodes: Secondary | ICD-10-CM | POA: Insufficient documentation

## 2012-08-28 DIAGNOSIS — F1410 Cocaine abuse, uncomplicated: Secondary | ICD-10-CM | POA: Insufficient documentation

## 2012-08-28 DIAGNOSIS — Z3202 Encounter for pregnancy test, result negative: Secondary | ICD-10-CM | POA: Insufficient documentation

## 2012-08-28 DIAGNOSIS — F329 Major depressive disorder, single episode, unspecified: Secondary | ICD-10-CM | POA: Insufficient documentation

## 2012-08-28 DIAGNOSIS — Z79899 Other long term (current) drug therapy: Secondary | ICD-10-CM | POA: Insufficient documentation

## 2012-08-28 DIAGNOSIS — F29 Unspecified psychosis not due to a substance or known physiological condition: Secondary | ICD-10-CM

## 2012-08-28 HISTORY — DX: Other specified disorders of the skin and subcutaneous tissue: L98.8

## 2012-08-28 LAB — CBC WITH DIFFERENTIAL/PLATELET
Basophils Absolute: 0 10*3/uL (ref 0.0–0.1)
Basophils Relative: 0 % (ref 0–1)
Eosinophils Absolute: 0 10*3/uL (ref 0.0–0.7)
Eosinophils Relative: 0 % (ref 0–5)
HCT: 38.9 % (ref 36.0–46.0)
Hemoglobin: 13.3 g/dL (ref 12.0–15.0)
Lymphocytes Relative: 13 % (ref 12–46)
Lymphs Abs: 1.7 10*3/uL (ref 0.7–4.0)
MCH: 30.9 pg (ref 26.0–34.0)
MCHC: 34.2 g/dL (ref 30.0–36.0)
MCV: 90.3 fL (ref 78.0–100.0)
Monocytes Absolute: 0.7 10*3/uL (ref 0.1–1.0)
Monocytes Relative: 5 % (ref 3–12)
Neutro Abs: 11.2 10*3/uL — ABNORMAL HIGH (ref 1.7–7.7)
Neutrophils Relative %: 82 % — ABNORMAL HIGH (ref 43–77)
Platelets: 235 10*3/uL (ref 150–400)
RBC: 4.31 MIL/uL (ref 3.87–5.11)
RDW: 12.5 % (ref 11.5–15.5)
WBC: 13.7 10*3/uL — ABNORMAL HIGH (ref 4.0–10.5)

## 2012-08-28 LAB — BASIC METABOLIC PANEL
BUN: 12 mg/dL (ref 6–23)
CO2: 27 mEq/L (ref 19–32)
Calcium: 9.9 mg/dL (ref 8.4–10.5)
Chloride: 105 mEq/L (ref 96–112)
Creatinine, Ser: 0.63 mg/dL (ref 0.50–1.10)
GFR calc Af Amer: >90 mL/min (ref >90)
GFR calc non Af Amer: >90 mL/min (ref >90)
Glucose, Bld: 87 mg/dL (ref 70–99)
Potassium: 3.7 mEq/L (ref 3.5–5.1)
Sodium: 144 mEq/L (ref 135–145)

## 2012-08-28 LAB — ETHANOL: Alcohol, Ethyl (B): <11 mg/dL (ref 0–11)

## 2012-08-28 MED ORDER — ALUM & MAG HYDROXIDE-SIMETH 200-200-20 MG/5ML PO SUSP: 30.0000 mL | ORAL | Status: DC | PRN

## 2012-08-28 MED ORDER — LORAZEPAM 1 MG PO TABS
1.0000 mg | ORAL_TABLET | Freq: Once | ORAL | Status: AC
  Administered 2012-08-28: 1 mg via ORAL
  Filled 2012-08-28: qty 1

## 2012-08-28 MED ORDER — ONDANSETRON HCL 4 MG PO TABS: 4.0000 mg | ORAL_TABLET | Freq: Three times a day (TID) | ORAL | Status: DC | PRN

## 2012-08-28 MED ORDER — LORAZEPAM 1 MG PO TABS
1.0000 mg | ORAL_TABLET | Freq: Three times a day (TID) | ORAL | Status: DC | PRN
  Administered 2012-08-29: 1 mg via ORAL
  Filled 2012-08-28: qty 1

## 2012-08-28 MED ORDER — ZOLPIDEM TARTRATE 5 MG PO TABS: 5.0000 mg | ORAL_TABLET | Freq: Every evening | ORAL | Status: DC | PRN

## 2012-08-28 MED ORDER — NICOTINE 21 MG/24HR TD PT24: 21.0000 mg | MEDICATED_PATCH | Freq: Every day | TRANSDERMAL | Status: DC | PRN

## 2012-08-28 MED ORDER — ACETAMINOPHEN 325 MG PO TABS: 650.0000 mg | ORAL_TABLET | ORAL | Status: DC | PRN

## 2012-08-28 MED ORDER — IBUPROFEN 200 MG PO TABS: 400.0000 mg | ORAL_TABLET | Freq: Three times a day (TID) | ORAL | Status: DC | PRN

## 2012-08-28 NOTE — ED Notes (Signed)
PER EMS- pt picked up from [employer] with c/o pt being naked and running up and down the hallway with a fire extinguisher.  EMS states on arrival pt was hallucinating reporting that stuff was crawling in her hair and wall over body.  Pupils dilated and non reactive.  Pt arrives to ED anxious, uncooperative, and combative.  Pt has hx of sleep disorder.

## 2012-08-28 NOTE — ED Notes (Signed)
RUE:[id number]<JW> Expected date:08/28/12<BR> Expected time: 8:59 PM<BR> Means of arrival:Ambulance<BR> Comments:<BR> Hallucinating

## 2012-08-28 NOTE — ED Notes (Signed)
Pt continues to be paranoid, wandering hall. RN assisted pt in changing socks and inspecting feet. Blanket spread on bed per pt request. Pt unwilling to state why she was spraying Government social research officer in hall at hotel, although she does admit doing so. Pt unable to urinate at this time, pt provided water to drink and encourage to drink fluids.
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
## 2012-08-29 LAB — URINE MICROSCOPIC-ADD ON

## 2012-08-29 LAB — RAPID URINE DRUG SCREEN, HOSP PERFORMED
Amphetamines: NOT DETECTED
Barbiturates: NOT DETECTED
Benzodiazepines: NOT DETECTED
Cocaine: POSITIVE — AB
Opiates: NOT DETECTED
Tetrahydrocannabinol: NOT DETECTED

## 2012-08-29 LAB — URINALYSIS, ROUTINE W REFLEX MICROSCOPIC
Bilirubin Urine: NEGATIVE
Glucose, UA: NEGATIVE mg/dL
Nitrite: NEGATIVE
Protein, ur: NEGATIVE mg/dL
Specific Gravity, Urine: 1.027 (ref 1.005–1.030)
Urobilinogen, UA: 0.2 mg/dL (ref 0.0–1.0)
pH: 6 (ref 5.0–8.0)

## 2012-08-29 LAB — PREGNANCY, URINE: Preg Test, Ur: NEGATIVE

## 2012-08-29 NOTE — ED Notes (Signed)
Patient with visual hallucinations; pt believes bugs are crawling in her hair and on other objects in room. Pt refusing to lay down on her bed. Pt request RN to inspect her hair for bugs in the bathroom; no bugs noted on assessment.

## 2012-08-29 NOTE — ED Provider Notes (Addendum)
0710.  Pt sleep in bed, Note stable VS, NAD.  Pt was seen in September for similar issues.  She has had or experienced drug-induced psychosis.  Placed consults to theACT Team and Telepsych providers.  Will review recommendations as available.  Tobin Chad, MD 08/29/12 [phone]  1530.  Pt stable, NAD.  She was evaluated by Dr. Berlin Hun.  At this time, she does not require admission to an inpt facility.  Reviewed labs.  Although she has a leukocytosis, she appears nontoxic.  Plan discharge home with information regarding local resources.  Tobin Chad, MD 08/29/12 [phone]

## 2012-08-29 NOTE — ED Notes (Signed)
This nurse faxed info to specialist on call and called to initiate consult, will monitor.

## 2012-08-29 NOTE — ED Notes (Signed)
Telepsych in process 
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
## 2012-08-29 NOTE — ED Notes (Signed)
Pt requests anxiety medication.

## 2012-08-29 NOTE — ED Notes (Signed)
Patient asleep; medication effective. No s/s of distress noted.

## 2012-08-29 NOTE — ED Notes (Signed)
Telepsych recommendations never received from Specialist on call, this nurse called with info faxed, will give to Dr. Lorenso Courier and monitor. Dr. Elsie Saas at bedside at present.

## 2014-05-07 ENCOUNTER — Other Ambulatory Visit: Payer: Self-pay | Admitting: Obstetrics and Gynecology

## 2014-05-08 LAB — CYTOLOGY - PAP

## 2014-05-12 ENCOUNTER — Other Ambulatory Visit: Payer: Self-pay | Admitting: Obstetrics and Gynecology

## 2014-05-12 DIAGNOSIS — R928 Other abnormal and inconclusive findings on diagnostic imaging of breast: Secondary | ICD-10-CM

## 2014-05-15 ENCOUNTER — Ambulatory Visit
Admission: RE | Admit: 2014-05-15 | Discharge: 2014-05-15 | Disposition: A | Discharge status: Home / Self Care | Payer: BC Managed Care – PPO | Source: Ambulatory Visit | Attending: Obstetrics and Gynecology | Admitting: Obstetrics and Gynecology

## 2014-05-15 DIAGNOSIS — R928 Other abnormal and inconclusive findings on diagnostic imaging of breast: Secondary | ICD-10-CM

## 2015-06-08 ENCOUNTER — Other Ambulatory Visit: Payer: Self-pay | Admitting: Obstetrics and Gynecology

## 2015-06-09 LAB — CYTOLOGY - PAP

## 2015-06-22 ENCOUNTER — Emergency Department (HOSPITAL_COMMUNITY): Payer: BLUE CROSS/BLUE SHIELD

## 2015-06-22 ENCOUNTER — Emergency Department (HOSPITAL_COMMUNITY)
Admission: EM | Admit: 2015-06-22 | Discharge: 2015-06-22 | Disposition: A | Discharge status: Home / Self Care | Payer: BLUE CROSS/BLUE SHIELD | Attending: Emergency Medicine | Admitting: Emergency Medicine

## 2015-06-22 ENCOUNTER — Encounter (HOSPITAL_COMMUNITY): Payer: Self-pay | Admitting: Emergency Medicine

## 2015-06-22 DIAGNOSIS — Z872 Personal history of diseases of the skin and subcutaneous tissue: Secondary | ICD-10-CM | POA: Diagnosis not present

## 2015-06-22 DIAGNOSIS — Z3202 Encounter for pregnancy test, result negative: Secondary | ICD-10-CM | POA: Diagnosis not present

## 2015-06-22 DIAGNOSIS — F419 Anxiety disorder, unspecified: Secondary | ICD-10-CM | POA: Insufficient documentation

## 2015-06-22 DIAGNOSIS — Z720 Tobacco use: Secondary | ICD-10-CM | POA: Diagnosis not present

## 2015-06-22 DIAGNOSIS — Z793 Long term (current) use of hormonal contraceptives: Secondary | ICD-10-CM | POA: Diagnosis not present

## 2015-06-22 DIAGNOSIS — K5900 Constipation, unspecified: Secondary | ICD-10-CM | POA: Diagnosis present

## 2015-06-22 DIAGNOSIS — Z79899 Other long term (current) drug therapy: Secondary | ICD-10-CM | POA: Insufficient documentation

## 2015-06-22 DIAGNOSIS — F319 Bipolar disorder, unspecified: Secondary | ICD-10-CM | POA: Insufficient documentation

## 2015-06-22 LAB — COMPREHENSIVE METABOLIC PANEL
ALT: 17 U/L (ref 14–54)
AST: 21 U/L (ref 15–41)
Albumin: 5.1 g/dL — ABNORMAL HIGH (ref 3.5–5.0)
Alkaline Phosphatase: 56 U/L (ref 38–126)
Anion gap: 9 (ref 5–15)
BUN: 10 mg/dL (ref 6–20)
CO2: 26 mmol/L (ref 22–32)
Calcium: 9.4 mg/dL (ref 8.9–10.3)
Chloride: 101 mmol/L (ref 101–111)
Creatinine, Ser: 0.79 mg/dL (ref 0.44–1.00)
GFR calc Af Amer: >60 mL/min (ref >60)
GFR calc non Af Amer: >60 mL/min (ref >60)
Glucose, Bld: 86 mg/dL (ref 65–99)
Potassium: 4.1 mmol/L (ref 3.5–5.1)
Sodium: 136 mmol/L (ref 135–145)
Total Bilirubin: 0.3 mg/dL (ref 0.3–1.2)
Total Protein: 8.7 g/dL — ABNORMAL HIGH (ref 6.5–8.1)

## 2015-06-22 LAB — CBC WITH DIFFERENTIAL/PLATELET
Basophils Absolute: 0 10*3/uL (ref 0.0–0.1)
Basophils Relative: 0 %
Eosinophils Absolute: 0.1 10*3/uL (ref 0.0–0.7)
Eosinophils Relative: 1 %
HCT: 36.9 % (ref 36.0–46.0)
Hemoglobin: 12.4 g/dL (ref 12.0–15.0)
Lymphocytes Relative: 44 %
Lymphs Abs: 3.6 10*3/uL (ref 0.7–4.0)
MCH: 31.6 pg (ref 26.0–34.0)
MCHC: 33.6 g/dL (ref 30.0–36.0)
MCV: 93.9 fL (ref 78.0–100.0)
Monocytes Absolute: 0.5 10*3/uL (ref 0.1–1.0)
Monocytes Relative: 5 %
Neutro Abs: 4.1 10*3/uL (ref 1.7–7.7)
Neutrophils Relative %: 50 %
Platelets: 248 10*3/uL (ref 150–400)
RBC: 3.93 MIL/uL (ref 3.87–5.11)
RDW: 13 % (ref 11.5–15.5)
WBC: 8.3 10*3/uL (ref 4.0–10.5)

## 2015-06-22 LAB — URINALYSIS, ROUTINE W REFLEX MICROSCOPIC
Bilirubin Urine: NEGATIVE
Glucose, UA: NEGATIVE mg/dL
Hgb urine dipstick: NEGATIVE
Ketones, ur: NEGATIVE mg/dL
Leukocytes, UA: NEGATIVE
Nitrite: NEGATIVE
Protein, ur: NEGATIVE mg/dL
Specific Gravity, Urine: 1.012 (ref 1.005–1.030)
Urobilinogen, UA: 0.2 mg/dL (ref 0.0–1.0)
pH: 6.5 (ref 5.0–8.0)

## 2015-06-22 LAB — POC URINE PREG, ED: Preg Test, Ur: NEGATIVE

## 2015-06-22 MED ORDER — IOHEXOL 300 MG/ML  SOLN
50.0000 mL | Freq: Once | INTRAMUSCULAR | Status: DC | PRN
  Administered 2015-06-22: 50 mL via ORAL
  Filled 2015-06-22: qty 50

## 2015-06-22 MED ORDER — OXYCODONE-ACETAMINOPHEN 5-325 MG PO TABS
1.0000 | ORAL_TABLET | Freq: Once | ORAL | Status: AC
  Administered 2015-06-22: 1 via ORAL
  Filled 2015-06-22: qty 1

## 2015-06-22 MED ORDER — IOHEXOL 300 MG/ML  SOLN: 100.0000 mL | Freq: Once | INTRAMUSCULAR | Status: DC | PRN

## 2015-06-22 NOTE — ED Notes (Signed)
Pt attempting to go outside for smoke. This RN informed patient that we cannot allow that as the process will be interupted and prolonged. Pt agreed. Hot pack given for back pain.

## 2015-06-22 NOTE — ED Notes (Signed)
MD at bedside. 
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
## 2015-06-22 NOTE — ED Notes (Signed)
Has used dulcolax suppository, mag citrate bottle, fiber and x-lax suppository and has no relief.

## 2015-06-22 NOTE — ED Notes (Signed)
Ambulatory to the bathroom. NAD

## 2015-06-22 NOTE — Discharge Instructions (Signed)
Start taking miralax, available over the counter, twice daily until you have multiple regular bowel movements.  Take colace, available over the counter as well.  Drink plenty of fluids.    Constipation Constipation is when a person has fewer than three bowel movements a week, has difficulty having a bowel movement, or has stools that are dry, hard, or larger than normal. As people grow older, constipation is more common. If you try to fix constipation with medicines that make you have a bowel movement (laxatives), the problem may get worse. Long-term laxative use may cause the muscles of the colon to become weak. A low-fiber diet, not taking in enough fluids, and taking certain medicines may make constipation worse.  CAUSES   Certain medicines, such as antidepressants, pain medicine, iron supplements, antacids, and water pills.   Certain diseases, such as diabetes, irritable bowel syndrome (IBS), thyroid disease, or depression.   Not drinking enough water.   Not eating enough fiber-rich foods.   Stress or travel.   Lack of physical activity or exercise.   Ignoring the urge to have a bowel movement.   Using laxatives too much.  SIGNS AND SYMPTOMS   Having fewer than three bowel movements a week.   Straining to have a bowel movement.   Having stools that are hard, dry, or larger than normal.   Feeling full or bloated.   Pain in the lower abdomen.   Not feeling relief after having a bowel movement.  DIAGNOSIS  Your health care provider will take a medical history and perform a physical exam. Further testing may be done for severe constipation. Some tests may include:  A barium enema X-ray to examine your rectum, colon, and, sometimes, your small intestine.   A sigmoidoscopy to examine your lower colon.   A colonoscopy to examine your entire colon. TREATMENT  Treatment will depend on the severity of your constipation and what is causing it. Some dietary  treatments include drinking more fluids and eating more fiber-rich foods. Lifestyle treatments may include regular exercise. If these diet and lifestyle recommendations do not help, your health care provider may recommend taking over-the-counter laxative medicines to help you have bowel movements. Prescription medicines may be prescribed if over-the-counter medicines do not work.  HOME CARE INSTRUCTIONS   Eat foods that have a lot of fiber, such as fruits, vegetables, whole grains, and beans.  Limit foods high in fat and processed sugars, such as french fries, hamburgers, cookies, candies, and soda.   A fiber supplement may be added to your diet if you cannot get enough fiber from foods.   Drink enough fluids to keep your urine clear or pale yellow.   Exercise regularly or as directed by your health care provider.   Go to the restroom when you have the urge to go. Do not hold it.   Only take over-the-counter or prescription medicines as directed by your health care provider. Do not take other medicines for constipation without talking to your health care provider first.  SEEK IMMEDIATE MEDICAL CARE IF:   You have bright red blood in your stool.   Your constipation lasts for more than 4 days or gets worse.   You have abdominal or rectal pain.   You have thin, pencil-like stools.   You have unexplained weight loss. MAKE SURE YOU:   Understand these instructions.  Will watch your condition.  Will get help right away if you are not doing well or get worse. Document Released: 06/09/2004 Document  Revised: 09/16/2013 Document Reviewed: 06/23/2013 [facility] Patient Information 2015 [place], [place]. This information is not intended to replace advice given to you by your health care provider. Make sure you discuss any questions you have with your health care provider.

## 2015-06-22 NOTE — Progress Notes (Signed)
Pt states ob gyn is [facility] and has been seen by various providers at [place] [facility]  CM updated EPIC with one of the providers Dr Foy Guadalajara

## 2015-06-22 NOTE — ED Notes (Signed)
Delay explained to patient 

## 2015-06-22 NOTE — ED Provider Notes (Signed)
CSN: [id number]     Arrival date & time 06/22/15  1049 History   First MD Initiated Contact with Patient 06/22/15 1152     Chief Complaint  Patient presents with  . Constipation    x8 days     Patient is a 44 y.o. female presenting with constipation. The history is provided by the patient. No language interpreter was used.  Constipation  Margaret Mccann presents for evaluation of constipation. She reports 8 days of constipation. She has tried Ex-Lax, Dulcolax suppositories, magnesium citrate without relief. She reports diffuse abdominal cramping and bloating. She has nausea and experienced 3 episodes of emesis. She denies any fevers. She has a history of appendectomy. Symptoms are moderate, constant, worsening.  Past Medical History  Diagnosis Date  . Depression   . Bipolar 1 disorder   . Anxiety   . Morgellons syndrome    Past Surgical History  Procedure Laterality Date  . Appendectomy     History reviewed. No pertinent family history. Social History  Substance Use Topics  . Smoking status: Current Every Day Smoker -- 0.50 packs/day    Types: Cigarettes  . Smokeless tobacco: None  . Alcohol Use: No   OB History    No data available     Review of Systems  Gastrointestinal: Positive for constipation.  All other systems reviewed and are negative.     Allergies  Lamictal  Home Medications   Prior to Admission medications   Medication Sig Start Date End Date Taking? Authorizing Provider  gabapentin (NEURONTIN) 600 MG tablet Take 1,200 mg by mouth 2 (two) times daily.    Yes Historical Provider, MD  Ibuprofen-Diphenhydramine HCl (ADVIL PM) 200-25 MG CAPS Take 2 tablets by mouth at bedtime.   Yes Historical Provider, MD  Lurasidone HCl (LATUDA) 60 MG TABS Take 30 mg by mouth at bedtime.   Yes Historical Provider, MD  Multiple Vitamins-Minerals (MULTIVITAMIN ADULT PO) Take 1 tablet by mouth daily.   Yes Historical Provider, MD  Norethin Ace-Eth Estrad-FE (MINASTRIN 24 FE PO)  Take 1 tablet by mouth daily.   Yes Historical Provider, MD  traZODone (DESYREL) 100 MG tablet Take 100 mg by mouth at bedtime.    Yes Historical Provider, MD  ARIPiprazole (ABILIFY) 15 MG tablet Take 1 tablet (15 mg total) by mouth daily. For delusional thinking. 06/07/12 07/07/12  Ronny Bacon, MD  traZODone (DESYREL) 100 MG tablet Take 1 tablet (100 mg total) by mouth at bedtime. For sleep. 06/07/12 07/07/12  Curlene Labrum Readling, MD   BP 119/86 mmHg  Pulse 67  Temp(Src) 98.7 F (37.1 C) (Oral)  Resp 16  SpO2 100%  LMP 06/01/2015 (Approximate) Physical Exam  Constitutional: She is oriented to person, place, and time. She appears well-developed and well-nourished.  HENT:  Head: Normocephalic and atraumatic.  Cardiovascular: Normal rate and regular rhythm.   No murmur heard. Pulmonary/Chest: Effort normal and breath sounds normal. No respiratory distress.  Abdominal: Bowel sounds are normal.  Mild abdominal distention with diffuse tenderness to palpation, no guarding  Genitourinary:  Empty rectal vault  Musculoskeletal: She exhibits no edema or tenderness.  Neurological: She is alert and oriented to person, place, and time.  Skin: Skin is warm and dry.  Psychiatric: She has a normal mood and affect. Her behavior is normal.  Nursing note and vitals reviewed.   ED Course  Procedures (including critical care time) Labs Review Labs Reviewed  URINALYSIS, ROUTINE W REFLEX MICROSCOPIC (NOT AT [facility]) - Abnormal; Notable for  the following:    APPearance CLOUDY (*)    All other components within normal limits  COMPREHENSIVE METABOLIC PANEL - Abnormal; Notable for the following:    Total Protein 8.7 (*)    Albumin 5.1 (*)    All other components within normal limits  CBC WITH DIFFERENTIAL/PLATELET  CBC WITH DIFFERENTIAL/PLATELET  POC URINE PREG, ED    Imaging Review Ct Abdomen Pelvis Wo Contrast  06/22/2015   CLINICAL DATA:  Constipation. Low back pain. Heart fluttering for 3  weeks. Bright red blood per rectum.  EXAM: CT ABDOMEN AND PELVIS WITHOUT CONTRAST  TECHNIQUE: Multidetector CT imaging of the abdomen and pelvis was performed following the standard protocol without IV contrast.  COMPARISON:  None.  FINDINGS: A 0.6 cm nodule is seen along the minor fissure on image 1. The nodule is incompletely visualized. The lung bases are otherwise clear. No pleural or pericardial effusion.  The gallbladder, liver, spleen, adrenal glands, pancreas and kidneys appear normal.  Uterus, adnexa and urinary bladder appear normal. Moderate to moderately large stool burden is seen throughout the colon. Oral contrast has progressed to the colon. The stomach, small bowel and appendix appear normal. No focal bony abnormality is identified.  IMPRESSION: No acute abnormality.  Moderately large stool burden throughout the colon.  0.6 cm nodule along the minor fissure may be a lymph node but cannot be definitively characterized. Given risk factors for bronchogenic carcinoma, follow-up chest CT at 6 - 12 months is recommended. This recommendation follows the consensus statement: Guidelines for Management of Small Pulmonary Nodules Detected on CT Scans: A Statement from the Fleischner Society as published in Radiology 2005;237:395-400.   Electronically Signed   By: Drusilla Kanner M.D.   On: 06/22/2015 15:57   I have personally reviewed and evaluated these images and lab results as part of my medical decision-making.   EKG Interpretation None      MDM   Final diagnoses:  Constipation, unspecified constipation type    Pt with here with constipation, no evidence of bowel obstruction or acute infectious process.  She did have a small amount of stool output with enema, no fecal impaction.  Discussed home care for constipation.  Discussed avoiding pain meds given these will worsen her constipation.  Return precautions were discussed.      Tilden Fossa, MD 06/23/15 [id number]

## 2015-06-22 NOTE — ED Notes (Signed)
Reports vomiting x3 days ago. Not present at assessment.

## 2015-07-12 ENCOUNTER — Ambulatory Visit (INDEPENDENT_AMBULATORY_CARE_PROVIDER_SITE_OTHER): Payer: BLUE CROSS/BLUE SHIELD | Admitting: Cardiology

## 2015-07-12 DIAGNOSIS — R072 Precordial pain: Secondary | ICD-10-CM

## 2015-07-12 NOTE — Progress Notes (Signed)
Patient ID: Margaret Mccann, female   DOB: 10/09/1970, 44 y.o.   MRN: [id number]  The patient rescheduled for Friday 07/16/2015

## 2015-07-13 ENCOUNTER — Encounter: Payer: Self-pay | Admitting: Cardiology

## 2015-07-13 ENCOUNTER — Ambulatory Visit (INDEPENDENT_AMBULATORY_CARE_PROVIDER_SITE_OTHER): Payer: BLUE CROSS/BLUE SHIELD | Admitting: Cardiology

## 2015-07-13 VITALS — BP 100/64 | HR 63 | Ht 62.5 in | Wt 109.8 lb

## 2015-07-13 DIAGNOSIS — Z8249 Family history of ischemic heart disease and other diseases of the circulatory system: Secondary | ICD-10-CM | POA: Diagnosis not present

## 2015-07-13 DIAGNOSIS — R079 Chest pain, unspecified: Secondary | ICD-10-CM | POA: Insufficient documentation

## 2015-07-13 DIAGNOSIS — R002 Palpitations: Secondary | ICD-10-CM | POA: Insufficient documentation

## 2015-07-13 DIAGNOSIS — R072 Precordial pain: Secondary | ICD-10-CM

## 2015-07-13 NOTE — Progress Notes (Signed)
Patient ID: Margaret Mccann, female   DOB: 10/09/1970, 44 y.o.   MRN: [id number]      Cardiology Office Note  Date:  07/13/2015   ID:  Margaret Mccann, DOB 12/27/1970, MRN [id number]  PCP:  Lenora BoysFRIED, ROBERT L, MD  Cardiologist: Lars MassonNELSON, Hlee Fringer H, MD   Chief complain: Palpitations, chest pain  History of Present Illness: Margaret CrockerCarol N Mccann is a 44 y.o. female who presents for evaluation of palpitations that feel like a skipped beat and are associated with anxiety. NO associated dizziness or SOB. She is also experiencing chest pain - pressure like left sided, resolve with deep inspiration, not elated to physical activity or food intake. She is concerned since her father had CVA and MI in early 6350' and died few years later after multiple CVAs.  She admits to 3 years of intense daily cocaine use (after she divorced), she has been clean for the last 3 years. She is very concerned about possible consequences on her heart.   Past Medical History  Diagnosis Date  . Depression   . Bipolar 1 disorder (HCC)   . Anxiety   . Morgellons syndrome    Past Surgical History  Procedure Laterality Date  . Appendectomy     Current Outpatient Prescriptions  Medication Sig Dispense Refill  . BELSOMRA 10 MG TABS Take 1 tablet by mouth at bedtime as needed (sleep alternating with Ambien).   5  . gabapentin (NEURONTIN) 600 MG tablet Take 1,200 mg by mouth 2 (two) times daily.     . Ibuprofen-Diphenhydramine HCl (ADVIL PM) 200-25 MG CAPS Take 2 tablets by mouth at bedtime.    . Lurasidone HCl (LATUDA) 60 MG TABS Take 30 mg by mouth at bedtime.    . Multiple Vitamins-Minerals (MULTIVITAMIN ADULT PO) Take 1 tablet by mouth daily.    . Norethin Ace-Eth Estrad-FE (MINASTRIN 24 FE PO) Take 1 tablet by mouth daily.    . traZODone (DESYREL) 100 MG tablet Take 100 mg by mouth at bedtime.     Marland Kitchen. zolpidem (AMBIEN) 10 MG tablet Take 10 mg by mouth at bedtime as needed (sleep alternating with Belsomra).   0  . traZODone (DESYREL) 100  MG tablet Take 1 tablet (100 mg total) by mouth at bedtime. For sleep. 30 tablet 0   No current facility-administered medications for this visit.    Allergies:   Lamictal    Social History:  The patient  reports that she has been smoking Cigarettes.  She has been smoking about 0.50 packs per day. She does not have any smokeless tobacco history on file. She reports that she does not drink alcohol or use illicit drugs.   Family History:  The patient's family history includes Heart attack (age of onset: 3550) in her father; Stroke in her father.    ROS:  Please see the history of present illness.    All other systems are reviewed and negative.   PHYSICAL EXAM: VS:  BP 100/64 mmHg  Pulse 63  Ht 5' 2.5" (1.588 m)  Wt 109 lb 12.8 oz (49.805 kg)  BMI 19.75 kg/m2  LMP 06/01/2015 (Approximate) , BMI Body mass index is 19.75 kg/(m^2). GEN: Well nourished, well developed, in no acute distress HEENT: normal Neck: no JVD, carotid bruits, or masses Cardiac: RRR; no murmurs, rubs, or gallops,no edema  Respiratory:  clear to auscultation bilaterally, normal work of breathing GI: soft, nontender, nondistended, + BS MS: no deformity or atrophy Skin: warm and dry, no rash Neuro:  Strength and sensation are intact Psych: euthymic mood, full affect  EKG:  SR, normal ECG  Recent Labs: 06/22/2015: ALT 17; BUN 10; Creatinine, Ser 0.79; Hemoglobin 12.4; Platelets 248; Potassium 4.1; Sodium 136   Lipid Panel No results found for: CHOL, TRIG, HDL, CHOLHDL, VLDL, LDLCALC, LDLDIRECT    Wt Readings from Last 3 Encounters:  07/13/15 109 lb 12.8 oz (49.805 kg)  06/06/12 96 lb (43.545 kg)  05/31/12 106 lb (48.081 kg)      ASSESSMENT AND PLAN:  1. Palpitations - we will order an echocardiogram especially consider prior cocaine use. However the palpitations sound like benign PVCs and PACs.  2. Chest pain - risk factors include cocaine use, FH of premature CAD, We will order an exercise nuclear stress  test.  3. Lipids - not checked in years, we will check.  Follow  Up in 6 weeks.  Signed, Lars Masson, MD  07/13/2015 9:20 AM    [facility] [address] [place], [place], [place]  [address] Phone: [phone]; Fax: [phone]

## 2015-07-13 NOTE — Patient Instructions (Signed)
Medication Instructions:   Your physician recommends that you continue on your current medications as directed. Please refer to the Current Medication list given to you today.      Testing/Procedures:  Your physician has requested that you have an echocardiogram. Echocardiography is a painless test that uses sound waves to create images of your heart. It provides your doctor with information about the size and shape of your heart and how well your heart's chambers and valves are working. This procedure takes approximately one hour. There are no restrictions for this procedure.   Your physician has requested that you have en exercise stress myoview. For further information please visit [email or web address]. Please follow instruction sheet, as given.     Follow-Up:  WITH DR Delton SeeNELSON AT HER NEXT AVAILABLE

## 2015-07-16 ENCOUNTER — Ambulatory Visit: Payer: Self-pay | Admitting: Cardiology

## 2015-07-21 ENCOUNTER — Telehealth (HOSPITAL_COMMUNITY): Payer: Self-pay | Admitting: *Deleted

## 2015-07-21 NOTE — Telephone Encounter (Signed)
Patient given detailed instructions per Myocardial Perfusion Study Information Sheet for the test on 07/26/15/ at 945. Patient notified to arrive 15 minutes early and that it is imperative to arrive on time for appointment to keep from having the test rescheduled.  If you need to cancel or reschedule your appointment, please call the office within 24 hours of your appointment. Failure to do so may result in a cancellation of your appointment, and a $50 no show fee. Patient verbalized understanding.Antionette CharMary J Teena Mangus, RN

## 2015-07-26 ENCOUNTER — Ambulatory Visit (HOSPITAL_COMMUNITY): Payer: BLUE CROSS/BLUE SHIELD | Attending: Cardiology

## 2015-07-26 ENCOUNTER — Telehealth (HOSPITAL_COMMUNITY): Payer: Self-pay | Admitting: *Deleted

## 2015-07-26 ENCOUNTER — Ambulatory Visit (HOSPITAL_COMMUNITY): Payer: BLUE CROSS/BLUE SHIELD

## 2015-07-26 NOTE — Telephone Encounter (Signed)
Patient was a no show for echo at 0830 and nuclear 0945. Attempted to notify patient but no answer on phone#. Unable to leave a detailed message on answer machine due to no DPR. Patient instructed to call us back to reschedule.Margaret Mccann, Margaret IdlerCynthia Mccann

## 2015-08-02 ENCOUNTER — Ambulatory Visit (INDEPENDENT_AMBULATORY_CARE_PROVIDER_SITE_OTHER): Payer: BLUE CROSS/BLUE SHIELD | Admitting: Cardiology

## 2015-08-02 DIAGNOSIS — R072 Precordial pain: Secondary | ICD-10-CM

## 2015-08-02 DIAGNOSIS — R0989 Other specified symptoms and signs involving the circulatory and respiratory systems: Secondary | ICD-10-CM

## 2015-08-02 NOTE — Progress Notes (Signed)
No show

## 2015-08-03 ENCOUNTER — Encounter: Payer: Self-pay | Admitting: Cardiology

## 2016-12-27 ENCOUNTER — Other Ambulatory Visit: Payer: Self-pay | Admitting: Obstetrics and Gynecology

## 2016-12-27 DIAGNOSIS — R928 Other abnormal and inconclusive findings on diagnostic imaging of breast: Secondary | ICD-10-CM

## 2016-12-29 ENCOUNTER — Ambulatory Visit
Admission: RE | Admit: 2016-12-29 | Discharge: 2016-12-29 | Disposition: A | Discharge status: Home / Self Care | Payer: BLUE CROSS/BLUE SHIELD | Source: Ambulatory Visit | Attending: Obstetrics and Gynecology | Admitting: Obstetrics and Gynecology

## 2016-12-29 DIAGNOSIS — R928 Other abnormal and inconclusive findings on diagnostic imaging of breast: Secondary | ICD-10-CM

## 2017-05-22 ENCOUNTER — Telehealth: Payer: Self-pay

## 2017-05-22 NOTE — Telephone Encounter (Signed)
Mother of patient called office in reference to scheduling appointment for patient. Pt has been experiencing recurrent right upper abdominal pain. Associated sx is increased gas, nausea and vomiting. Releasing gas does make it better. Years ago had appendix removed. Approx 3 weeks ago pt did go to UC in [place] with similar sx--US done which was normal. Patient has not established care here at the office so my recommendations were to go to local ER for further work up then call back to establish care here at office. Mother was agreeable and would discuss with patient. No further communication needed.

## 2017-05-23 ENCOUNTER — Encounter: Payer: Self-pay | Admitting: Physician Assistant

## 2017-05-25 ENCOUNTER — Encounter: Payer: Self-pay | Admitting: Physician Assistant

## 2017-05-25 ENCOUNTER — Telehealth: Payer: Self-pay | Admitting: Physician Assistant

## 2017-05-25 ENCOUNTER — Other Ambulatory Visit: Payer: Self-pay

## 2017-05-25 ENCOUNTER — Ambulatory Visit (INDEPENDENT_AMBULATORY_CARE_PROVIDER_SITE_OTHER): Payer: BLUE CROSS/BLUE SHIELD | Admitting: Physician Assistant

## 2017-05-25 ENCOUNTER — Other Ambulatory Visit (INDEPENDENT_AMBULATORY_CARE_PROVIDER_SITE_OTHER): Payer: BLUE CROSS/BLUE SHIELD

## 2017-05-25 ENCOUNTER — Encounter (INDEPENDENT_AMBULATORY_CARE_PROVIDER_SITE_OTHER): Payer: Self-pay

## 2017-05-25 VITALS — BP 130/98 | HR 76 | Ht 63.0 in | Wt 127.0 lb

## 2017-05-25 DIAGNOSIS — R1012 Left upper quadrant pain: Secondary | ICD-10-CM

## 2017-05-25 DIAGNOSIS — K209 Esophagitis, unspecified without bleeding: Secondary | ICD-10-CM

## 2017-05-25 DIAGNOSIS — R112 Nausea with vomiting, unspecified: Secondary | ICD-10-CM

## 2017-05-25 LAB — CBC WITH DIFFERENTIAL/PLATELET
Basophils Absolute: 0 10*3/uL (ref 0.0–0.1)
Basophils Relative: 0.2 % (ref 0.0–3.0)
Eosinophils Absolute: 0.1 10*3/uL (ref 0.0–0.7)
Eosinophils Relative: 1.1 % (ref 0.0–5.0)
HCT: 41.1 % (ref 36.0–46.0)
Hemoglobin: 13.4 g/dL (ref 12.0–15.0)
Lymphocytes Relative: 21.3 % (ref 12.0–46.0)
Lymphs Abs: 2.6 10*3/uL (ref 0.7–4.0)
MCHC: 32.7 g/dL (ref 30.0–36.0)
MCV: 91.2 fl (ref 78.0–100.0)
Monocytes Absolute: 0.9 10*3/uL (ref 0.1–1.0)
Monocytes Relative: 7.2 % (ref 3.0–12.0)
Neutro Abs: 8.5 10*3/uL — ABNORMAL HIGH (ref 1.4–7.7)
Neutrophils Relative %: 70.2 % (ref 43.0–77.0)
Platelets: 292 10*3/uL (ref 150.0–400.0)
RBC: 4.51 Mil/uL (ref 3.87–5.11)
RDW: 13.7 % (ref 11.5–15.5)
WBC: 12.1 10*3/uL — ABNORMAL HIGH (ref 4.0–10.5)

## 2017-05-25 LAB — LIPASE: Lipase: 3 U/L — ABNORMAL LOW (ref 11.0–59.0)

## 2017-05-25 LAB — COMPREHENSIVE METABOLIC PANEL
ALT: 14 U/L (ref 0–35)
AST: 14 U/L (ref 0–37)
Albumin: 4.2 g/dL (ref 3.5–5.2)
Alkaline Phosphatase: 61 U/L (ref 39–117)
BUN: 4 mg/dL — ABNORMAL LOW (ref 6–23)
CO2: 23 mEq/L (ref 19–32)
Calcium: 9.5 mg/dL (ref 8.4–10.5)
Chloride: 107 mEq/L (ref 96–112)
Creatinine, Ser: 0.87 mg/dL (ref 0.40–1.20)
GFR: 75.12 mL/min (ref >60.00)
Glucose, Bld: 90 mg/dL (ref 70–99)
Potassium: 3.6 mEq/L (ref 3.5–5.1)
Sodium: 139 mEq/L (ref 135–145)
Total Bilirubin: 0.4 mg/dL (ref 0.2–1.2)
Total Protein: 6.9 g/dL (ref 6.0–8.3)

## 2017-05-25 LAB — SEDIMENTATION RATE: Sed Rate: 5 mm/hr (ref 0–20)

## 2017-05-25 MED ORDER — PROMETHAZINE HCL 25 MG PO TABS: 25.0000 mg | ORAL_TABLET | Freq: Four times a day (QID) | ORAL | 0 refills | Status: DC | PRN

## 2017-05-25 MED ORDER — TRAMADOL HCL 50 MG PO TABS: 50.0000 mg | ORAL_TABLET | Freq: Four times a day (QID) | ORAL | 0 refills | Status: DC | PRN

## 2017-05-25 MED ORDER — ONDANSETRON 4 MG PO TBDP: ORAL_TABLET | ORAL | 1 refills | Status: DC

## 2017-05-25 MED ORDER — PANTOPRAZOLE SODIUM 40 MG PO TBEC: 40.0000 mg | DELAYED_RELEASE_TABLET | Freq: Two times a day (BID) | ORAL | 1 refills | Status: DC

## 2017-05-25 NOTE — Telephone Encounter (Signed)
Please call pharmacy  If Phenergan  preferable over zofran from interaction standpoint can switch  to phenergan 25 mg q 6-8 hours prn nausea # 30/0 She is having persistent nausea /vomoting so she needs something

## 2017-05-25 NOTE — Progress Notes (Signed)
Initial assessment and plans reviewed 

## 2017-05-25 NOTE — Telephone Encounter (Signed)
Zofran cancelled. New Rx for [facility]

## 2017-05-25 NOTE — Progress Notes (Signed)
Subjective:    Patient ID: Margaret Mccann, female    DOB: 1971/06/19, 46 y.o.   MRN: [id number]  HPI   Margaret Mccann is a 46 year old, white female, new to GI today, self-referred per mother For evaluation of abdominal pain nausea and vomiting. Patient has not had any previous GI evaluation. She has history of bipolar disorder, anxiety and previous history of cocaine and amphetamine abuse. She has been clean over the last 3+ years. Also status post appendectomy. Patient says her symptoms started acutely about 3 weeks ago with development of mid abdominal pain followed by nausea and vomiting which became intractable. She went to the emergency room in [place] and says that she had labs and CT of the abdomen and pelvis done. She received IV fluids, antibiotics and pain medication. She was told that she may have been epiploic appendagitis She was discharged home with anti-emetics. We have requested copies of records but have not obtained them as yet. She says that her symptoms improved for about a week then recurred and have been worse than at onset over the past week. She has had multiple episodes of vomiting and difficulty keep down by mouth's. On further questioning she is able to keep liquids down. She has developed discomfort in her chest and a raw feeling as well as a sense of difficulty swallowing solid food. She has vomited up some streaky blood on a couple of occasions. She says her abdomen is still hurting primarily on the left side. Has not had any fever or chills, no diarrhea. Denies any aspirin or NSAID use chronically but has been taking some Advil over the past week., denies EtOH use.     Review of Systems Pertinent positive and negative review of systems were noted in the above HPI section.  All other review of systems was otherwise negative.  Outpatient Encounter Prescriptions as of 05/25/2017  Medication Sig  . buPROPion (WELLBUTRIN XL) 150 MG 24 hr tablet Take 150 mg by mouth daily.  Marland Kitchen  FLUoxetine (PROZAC) 40 MG capsule Take 40 mg by mouth daily.  Marland Kitchen gabapentin (NEURONTIN) 600 MG tablet Take 1,200 mg by mouth 2 (two) times daily.   . mirtazapine (REMERON) 15 MG tablet Take 15 mg by mouth at bedtime.  . Multiple Vitamins-Minerals (MULTIVITAMIN ADULT PO) Take 1 tablet by mouth daily.  . Norethin Ace-Eth Estrad-FE (MINASTRIN 24 FE PO) Take 1 tablet by mouth daily.  . [DISCONTINUED] Ibuprofen-Diphenhydramine HCl (ADVIL PM) 200-25 MG CAPS Take 2 tablets by mouth at bedtime.  . [DISCONTINUED] Lurasidone HCl (LATUDA) 60 MG TABS Take 30 mg by mouth at bedtime.  . [DISCONTINUED] traZODone (DESYREL) 100 MG tablet Take 100 mg by mouth at bedtime.   . [DISCONTINUED] zolpidem (AMBIEN) 10 MG tablet Take 10 mg by mouth at bedtime as needed (sleep alternating with Belsomra).   . ondansetron (ZOFRAN ODT) 4 MG disintegrating tablet Dissolve 1 tab every 6 hours as needed for nausea.  . pantoprazole (PROTONIX) 40 MG tablet Take 1 tablet (40 mg total) by mouth 2 (two) times daily.  . traMADol (ULTRAM) 50 MG tablet Take 1 tablet (50 mg total) by mouth every 6 (six) hours as needed.  . traZODone (DESYREL) 100 MG tablet Take 1 tablet (100 mg total) by mouth at bedtime. For sleep.  . [DISCONTINUED] BELSOMRA 10 MG TABS Take 1 tablet by mouth at bedtime as needed (sleep alternating with Ambien).    No facility-administered encounter medications on file as of 05/25/2017.    Allergies  Allergen Reactions  . Lamictal [Lamotrigine] Other (See Comments)    Margaret Mccann   Patient Active Problem List   Diagnosis Date Noted  . Chest pain 07/13/2015  . Palpitations 07/13/2015  . Cocaine abuse 06/03/2012  . 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 Marital status: Divorced    Spouse name: N/A  . Number of children: N/A  . Years of education: N/A   Occupational History  . Not  on file.   Social History Main Topics  . Smoking status: Current Every Day Smoker    Packs/day: 0.50    Types: Cigarettes  . Smokeless tobacco: Never Used  . Alcohol use No  . Drug use: No     Comment: not currently-was +cocaine and amphetamines, clean x3 years  . Sexual activity: Yes    Birth control/ protection: Pill   Other Topics Concern  . Not on file   Social History Narrative  . No narrative on file    [name]'s family history includes Breast cancer in her paternal aunt; Heart attack (age of onset: 6750) in her father; Stroke in her father.      Objective:    Vitals:   05/25/17 1012  BP: (!) 130/98  Pulse: 76    Physical Exam  well-developed white female, walking on the exam table, by mouth junk her than stated age, accompanied by her mother. Blood pressure 130/98 pulse 76, height 5 foot 3, weight 127, BMI 22.5. HEENT; nontraumatic normocephalic EOMI PERRLA sclera anicteric, Cardiovascular; regular rate and rhythm with S1-S2 no murmur or gallop, Pulmonary; clear bilaterally, Abdomen ;soft, bowel sounds are present, there is no guarding or rebound she has mild tenderness in the left mid abdomen no palpable mass or hepatosplenomegaly, Rectal; exam not done, Extremities ;no clubbing cyanosis or edema skin warm and dry, Neuropsych; mood and affect appropriate       Assessment & Plan:   #151 46 year old female with 2-3 week history of recurrent nausea and vomiting and left mid abdominal pain. By patient report, she was told that she had epiploic appendagitis on CT scan- done through the [place] in [place]. Etiology of persistent symptoms is not clear, though I suspect she does have esophagitis secondary to multiple episodes of vomiting. Rule out gastropathy/peptic ulcer disease, rule out viral syndrome. Not sure that ongoing nausea and vomiting is explained by potential finding of epiploic appendagitis #2 status post appendectomy #3 bipolar disorder #4 anxiety #5  previous history of cocaine and amphetamine abuse  Plan; clear to full liquid diet and push fluids CBC with differential, CMET , lipase and sedimentation rate Start her topics 40 mg by mouth twice daily Zofran ODT 4 mg every 6 hours when necessary for nausea and vomiting, advise she take this round-the-clock over the next few days Tramadol 50 mg every 6 hours when necessary for pain #20 no refills Patient will be scheduled for EGD with Dr. Marina GoodellPerry. Procedure discussed in detail with the patient and her mother and they're agreeable to proceed. Have requested ER records from [place]. She may need a course of antibiotics if WBC elevated today, with persistence of pain and suggestion of epiploic appendagitis  Hameed Kolar S Evaleen Sant PA-C 05/25/2017   Cc: Marinda ElkFried, Robert, MD

## 2017-05-25 NOTE — Patient Instructions (Addendum)
Please go to the basement level to have your labs drawn.  We have sent the following medications to your pharmacy for you to pick up at your convenience: [facility].  1. Zofran ODT 2. Pantoprazole sodium 40 mg 4. Tramadol 50 mg for pain.  Push fluids. Clear liquid diet handout provided.   You have been scheduled for an endoscopy. Please follow written instructions given to you at your visit today. If you use inhalers (even only as needed), please bring them with you on the day of your procedure. Your physician has requested that you go to www.startemmi.com and enter the access code given to you at your visit today. This web site gives a general overview about your procedure. However, you should still follow specific instructions given to you by our office regarding your preparation for the procedure.

## 2017-05-29 ENCOUNTER — Encounter: Payer: Self-pay | Admitting: Internal Medicine

## 2017-05-29 ENCOUNTER — Ambulatory Visit (AMBULATORY_SURGERY_CENTER): Payer: BLUE CROSS/BLUE SHIELD | Admitting: Internal Medicine

## 2017-05-29 VITALS — BP 119/89 | HR 82 | Temp 98.9°F | Resp 23 | Ht 63.0 in | Wt 127.0 lb

## 2017-05-29 DIAGNOSIS — K2100 Gastro-esophageal reflux disease with esophagitis, without bleeding: Secondary | ICD-10-CM

## 2017-05-29 DIAGNOSIS — R1012 Left upper quadrant pain: Secondary | ICD-10-CM

## 2017-05-29 DIAGNOSIS — R1114 Bilious vomiting: Secondary | ICD-10-CM

## 2017-05-29 DIAGNOSIS — K209 Esophagitis, unspecified without bleeding: Secondary | ICD-10-CM

## 2017-05-29 MED ORDER — PROMETHAZINE HCL 25 MG PO TABS: 25.0000 mg | ORAL_TABLET | Freq: Four times a day (QID) | ORAL | 0 refills | Status: DC | PRN

## 2017-05-29 MED ORDER — SODIUM CHLORIDE 0.9 % IV SOLN: 500.0000 mL | INTRAVENOUS | Status: DC

## 2017-05-29 NOTE — Progress Notes (Signed)
Called to room to assist during endoscopic procedure.  Patient ID and intended procedure confirmed with present staff. Received instructions for my participation in the procedure from the performing physician.  

## 2017-05-29 NOTE — Progress Notes (Signed)
To recovery, report to Hines, RN, VSS 

## 2017-05-29 NOTE — Op Note (Signed)
[facility] Patient Name: Margaret Mccann Procedure Date: 05/29/2017 12:45 PM MRN: [id number] Endoscopist: Wilhemina BonitoJohn N. Marina GoodellPerry , MD Age: 46 Referring MD:  Date of Birth: 11/06/1970 Gender: Female Account #: [id number] Procedure:                Upper GI endoscopy, with biopsies Indications:              Abdominal pain in the left upper quadrant,                            Odynophagia Medicines:                Monitored Anesthesia Care Procedure:                Pre-Anesthesia Assessment:                           - Prior to the procedure, a History and Physical                            was performed, and patient medications and                            allergies were reviewed. The patient's tolerance of                            previous anesthesia was also reviewed. The risks                            and benefits of the procedure and the sedation                            options and risks were discussed with the patient.                            All questions were answered, and informed consent                            was obtained. Prior Anticoagulants: The patient has                            taken no previous anticoagulant or antiplatelet                            agents. ASA Grade Assessment: II - A patient with                            mild systemic disease. After reviewing the risks                            and benefits, the patient was deemed in                            satisfactory condition to undergo the procedure.  After obtaining informed consent, the endoscope was                            passed under direct vision. Throughout the                            procedure, the patient's blood pressure, pulse, and                            oxygen saturations were monitored continuously. The                            Model GIF-[id number]) scope was introduced                            through the mouth, and advanced to the  second part                            of duodenum. The upper GI endoscopy was                            accomplished without difficulty. The patient                            tolerated the procedure well. Scope In: Scope Out: Findings:                 Moderately severe esophagitis was found. This was                            manifested by edema and friability at the mucosal Z                            line as well as linear erosions more proximally.                            Biopsies were taken with a cold forceps for                            histology. There was also ringlike stricturing of                            the esophagus distally.                           The esophagus was otherwise normal.                           The stomach was normal, save small hiatal hernia.                           The examined duodenum was normal.                           The cardia and gastric fundus were  normal on                            retroflexion. Complications:            No immediate complications. Estimated Blood Loss:     Estimated blood loss: none. Impression:               - Moderately severe reflux esophagitis. Biopsied.                            Ringlike stricturing.                           - Normal esophagus, otherwise.                           - Normal stomach.                           - Normal examined duodenum. Recommendation:           1. Liquids and soft foods until discomfort with                            swallowing improves. Thereafter advance diet as                            tolerated preferred diet                           2. Continue pantoprazole 40 mg twice daily                           3. Office follow-up 4 weeks with myself or Amy                            Esterwood PA-C.                           4. Follow-up biopsies. We will send you a letter                            with results and additional recommendations if                             necessary [name], MD 05/29/2017 1:22:11 PM This report has been signed electronically.

## 2017-05-29 NOTE — Patient Instructions (Addendum)
YOU HAD AN ENDOSCOPIC PROCEDURE TODAY AT THE [facility]:   Refer to the procedure report that was given to you for any specific questions about what was found during the examination.  If the procedure report does not answer your questions, please call your gastroenterologist to clarify.  If you requested that your care partner not be given the details of your procedure findings, then the procedure report has been included in a sealed envelope for you to review at your convenience later.  YOU SHOULD EXPECT: Some feelings of bloating in the abdomen. Passage of more gas than usual.  Walking can help get rid of the air that was put into your GI tract during the procedure and reduce the bloating. If you had a lower endoscopy (such as a colonoscopy or flexible sigmoidoscopy) you may notice spotting of blood in your stool or on the toilet paper. If you underwent a bowel prep for your procedure, you may not have a normal bowel movement for a few days.  Please Note:  You might notice some irritation and congestion in your nose or some drainage.  This is from the oxygen used during your procedure.  There is no need for concern and it should clear up in a day or so.  SYMPTOMS TO REPORT IMMEDIATELY:   Following upper endoscopy (EGD)  Vomiting of blood or coffee ground material  New chest pain or pain under the shoulder blades  Painful or persistently difficult swallowing  New shortness of breath  Fever of 100F or higher  Black, tarry-looking stools  For urgent or emergent issues, a gastroenterologist can be reached at any hour by calling [phone].   DIET:  Liquids and soft foods until discomfort with swallowing improves. Thereafter advance diet as tolerated to preferred diet. We do recommend a small meal at first, but then you may proceed to your regular diet.  Drink plenty of fluids but you should avoid alcoholic beverages for 24 hours.  MEDICATIONS: Continue Pantoprazole 40 mg by  mouth twice daily.  Follow up in GI clinic with Dr. Marina GoodellPerry or Amy Esterwood PA-C in 4 weeks.  ACTIVITY:  You should plan to take it easy for the rest of today and you should NOT DRIVE or use heavy machinery until tomorrow (because of the sedation medicines used during the test).    FOLLOW UP: Our staff will call the number listed on your records the next business day following your procedure to check on you and address any questions or concerns that you may have regarding the information given to you following your procedure. If we do not reach you, we will leave a message.  However, if you are feeling well and you are not experiencing any problems, there is no need to return our call.  We will assume that you have returned to your regular daily activities without incident.  If any biopsies were taken you will be contacted by phone or by letter within the next 1-3 weeks.  Please call us at [phone] if you have not heard about the biopsies in 3 weeks.   Thank you for allowing us to provide for your healthcare needs today.  SIGNATURES/CONFIDENTIALITY: You and/or your care partner have signed paperwork which will be entered into your electronic medical record.  These signatures attest to the fact that that the information above on your After Visit Summary has been reviewed and is understood.  Full responsibility of the confidentiality of this discharge information lies with you  and/or your care-partner. 

## 2017-05-29 NOTE — Progress Notes (Signed)
Patient here in recovery post EGD. VSS, Passing air. Patient c/o abdominal and epigastric pain that patient and patient's mother (at bedside) say is the same pain the patient was having prior to the procedure. Dr. Marina GoodellPerry is here to see patient and patient's mother. RF of phenergan electronically sent to patient's pharmacy of choice as per V.O. Dr. Marina GoodellPerry. Patient asks Dr. Marina GoodellPerry if she can have anything for her pain. She requests a RF of her Tramadol, but stated the Tramadol was not helping her pain. Dr. Marina GoodellPerry spoke with patient and advised her to continue her Pantoprazole. No new pain meds at this time per Dr. Marina GoodellPerry. I went over an anti-reflux regimen with patient and her mother and advised her to follow this regimen along with her Pantoprazole. GERD handout given to patient along with other handouts specific for her care. Patient and patient's mother verbalize understanding.

## 2017-05-30 ENCOUNTER — Telehealth: Payer: Self-pay | Admitting: *Deleted

## 2017-05-30 ENCOUNTER — Telehealth: Payer: Self-pay

## 2017-05-30 NOTE — Telephone Encounter (Signed)
  Follow up Call-  Call back number 05/29/2017  Post procedure Call Back phone  # [phone]  Permission to leave phone message Yes  Some recent data might be hidden     Patient questions:  Do you have a fever, pain , or abdominal swelling? No. Pain Score  0 *  Have you tolerated food without any problems? Yes.    Have you been able to return to your normal activities? Yes.    Do you have any questions about your discharge instructions: Diet   No. Medications  No. Follow up visit  No.  Do you have questions or concerns about your Care? No.  Actions: * If pain score is 4 or above: No action needed, pain <4.

## 2017-05-30 NOTE — Telephone Encounter (Signed)
Called [phone] and left a messaged we tried to reach pt for a follow up call. maw

## 2017-06-06 ENCOUNTER — Encounter: Payer: Self-pay | Admitting: Internal Medicine

## 2017-06-07 ENCOUNTER — Ambulatory Visit: Payer: Self-pay | Admitting: Physician Assistant

## 2017-06-20 ENCOUNTER — Other Ambulatory Visit: Payer: Self-pay | Admitting: *Deleted

## 2017-06-20 ENCOUNTER — Telehealth: Payer: Self-pay

## 2017-06-20 MED ORDER — PANTOPRAZOLE SODIUM 40 MG PO TBEC: 40.0000 mg | DELAYED_RELEASE_TABLET | Freq: Two times a day (BID) | ORAL | 3 refills | Status: DC

## 2017-06-20 NOTE — Telephone Encounter (Signed)
Pt scheduled for OV with Amy Esterwood PA 06/29/17@1 :30pm. Pt aware of appt.

## 2017-06-20 NOTE — Telephone Encounter (Signed)
-----   Message from Chrystie Nose, RN sent at 05/31/2017  9:56 AM EDT ----- Regarding: OV Pt needs OV with Mike Gip PA around first week of October

## 2017-06-29 ENCOUNTER — Ambulatory Visit: Payer: Self-pay | Admitting: Physician Assistant

## 2017-07-04 ENCOUNTER — Telehealth: Payer: Self-pay | Admitting: Physician Assistant

## 2017-07-04 NOTE — Telephone Encounter (Signed)
Ok - looks as if she was diagnosed with UTI/Pyelonephritis from recent ER visit -had been on antibiotics and oxycodone , and phenergan also given on discharge from ER.

## 2017-07-04 NOTE — Telephone Encounter (Signed)
Seen in ER. Referred to Neurology. Encounter note in "care everywhere".

## 2017-07-18 ENCOUNTER — Telehealth: Payer: Self-pay | Admitting: General Practice

## 2017-07-18 NOTE — Telephone Encounter (Signed)
Received referral from [facility].  I have left a vm for patient to call back to schedule appt to establish care.

## 2017-07-25 ENCOUNTER — Other Ambulatory Visit: Payer: Self-pay | Admitting: Internal Medicine

## 2017-07-25 DIAGNOSIS — K2100 Gastro-esophageal reflux disease with esophagitis, without bleeding: Secondary | ICD-10-CM

## 2017-07-25 DIAGNOSIS — K209 Esophagitis, unspecified without bleeding: Secondary | ICD-10-CM

## 2017-07-25 DIAGNOSIS — R1012 Left upper quadrant pain: Secondary | ICD-10-CM

## 2018-04-02 ENCOUNTER — Encounter (HOSPITAL_COMMUNITY): Payer: Self-pay | Admitting: Emergency Medicine

## 2018-04-02 ENCOUNTER — Emergency Department (HOSPITAL_COMMUNITY): Payer: BLUE CROSS/BLUE SHIELD

## 2018-04-02 ENCOUNTER — Other Ambulatory Visit: Payer: Self-pay

## 2018-04-02 ENCOUNTER — Emergency Department (HOSPITAL_COMMUNITY)
Admission: EM | Admit: 2018-04-02 | Discharge: 2018-04-02 | Disposition: A | Discharge status: Home / Self Care | Payer: BLUE CROSS/BLUE SHIELD | Attending: Emergency Medicine | Admitting: Emergency Medicine

## 2018-04-02 DIAGNOSIS — R109 Unspecified abdominal pain: Secondary | ICD-10-CM | POA: Insufficient documentation

## 2018-04-02 DIAGNOSIS — Z79899 Other long term (current) drug therapy: Secondary | ICD-10-CM | POA: Insufficient documentation

## 2018-04-02 DIAGNOSIS — S3992XA Unspecified injury of lower back, initial encounter: Secondary | ICD-10-CM | POA: Diagnosis present

## 2018-04-02 DIAGNOSIS — M79604 Pain in right leg: Secondary | ICD-10-CM | POA: Insufficient documentation

## 2018-04-02 DIAGNOSIS — S32049A Unspecified fracture of fourth lumbar vertebra, initial encounter for closed fracture: Secondary | ICD-10-CM

## 2018-04-02 DIAGNOSIS — Y929 Unspecified place or not applicable: Secondary | ICD-10-CM | POA: Insufficient documentation

## 2018-04-02 DIAGNOSIS — Y999 Unspecified external cause status: Secondary | ICD-10-CM | POA: Insufficient documentation

## 2018-04-02 DIAGNOSIS — Z87891 Personal history of nicotine dependence: Secondary | ICD-10-CM | POA: Diagnosis not present

## 2018-04-02 DIAGNOSIS — Y939 Activity, unspecified: Secondary | ICD-10-CM | POA: Insufficient documentation

## 2018-04-02 DIAGNOSIS — S32039A Unspecified fracture of third lumbar vertebra, initial encounter for closed fracture: Secondary | ICD-10-CM

## 2018-04-02 LAB — COMPREHENSIVE METABOLIC PANEL
ALT: 22 U/L (ref 0–44)
AST: 28 U/L (ref 15–41)
Albumin: 3.3 g/dL — ABNORMAL LOW (ref 3.5–5.0)
Alkaline Phosphatase: 74 U/L (ref 38–126)
Anion gap: 7 (ref 5–15)
BUN: 15 mg/dL (ref 6–20)
CO2: 25 mmol/L (ref 22–32)
Calcium: 8.8 mg/dL — ABNORMAL LOW (ref 8.9–10.3)
Chloride: 109 mmol/L (ref 98–111)
Creatinine, Ser: 0.73 mg/dL (ref 0.44–1.00)
GFR calc Af Amer: >60 mL/min (ref >60)
GFR calc non Af Amer: >60 mL/min (ref >60)
Glucose, Bld: 104 mg/dL — ABNORMAL HIGH (ref 70–99)
Potassium: 4.9 mmol/L (ref 3.5–5.1)
Sodium: 141 mmol/L (ref 135–145)
Total Bilirubin: 0.5 mg/dL (ref 0.3–1.2)
Total Protein: 6.4 g/dL — ABNORMAL LOW (ref 6.5–8.1)

## 2018-04-02 LAB — I-STAT BETA HCG BLOOD, ED (MC, WL, AP ONLY): I-stat hCG, quantitative: <5 m[IU]/mL (ref <5)

## 2018-04-02 LAB — I-STAT CHEM 8, ED
BUN: 16 mg/dL (ref 6–20)
Calcium, Ion: 1.21 mmol/L (ref 1.15–1.40)
Chloride: 108 mmol/L (ref 98–111)
Creatinine, Ser: 0.6 mg/dL (ref 0.44–1.00)
Glucose, Bld: 101 mg/dL — ABNORMAL HIGH (ref 70–99)
HCT: 38 % (ref 36.0–46.0)
Hemoglobin: 12.9 g/dL (ref 12.0–15.0)
Potassium: 4.8 mmol/L (ref 3.5–5.1)
Sodium: 143 mmol/L (ref 135–145)
TCO2: 23 mmol/L (ref 22–32)

## 2018-04-02 LAB — CBC WITH DIFFERENTIAL/PLATELET
Abs Immature Granulocytes: 0 10*3/uL (ref 0.0–0.1)
Basophils Absolute: 0 10*3/uL (ref 0.0–0.1)
Basophils Relative: 0 %
Eosinophils Absolute: 0.2 10*3/uL (ref 0.0–0.7)
Eosinophils Relative: 2 %
HCT: 38.7 % (ref 36.0–46.0)
Hemoglobin: 12.5 g/dL (ref 12.0–15.0)
Immature Granulocytes: 0 %
Lymphocytes Relative: 23 %
Lymphs Abs: 2.3 10*3/uL (ref 0.7–4.0)
MCH: 30.6 pg (ref 26.0–34.0)
MCHC: 32.3 g/dL (ref 30.0–36.0)
MCV: 94.9 fL (ref 78.0–100.0)
Monocytes Absolute: 0.6 10*3/uL (ref 0.1–1.0)
Monocytes Relative: 6 %
Neutro Abs: 6.9 10*3/uL (ref 1.7–7.7)
Neutrophils Relative %: 69 %
Platelets: 279 10*3/uL (ref 150–400)
RBC: 4.08 MIL/uL (ref 3.87–5.11)
RDW: 13 % (ref 11.5–15.5)
WBC: 10 10*3/uL (ref 4.0–10.5)

## 2018-04-02 LAB — URINALYSIS, ROUTINE W REFLEX MICROSCOPIC
Bilirubin Urine: NEGATIVE
Glucose, UA: NEGATIVE mg/dL
Hgb urine dipstick: NEGATIVE
Ketones, ur: NEGATIVE mg/dL
Leukocytes, UA: NEGATIVE
Nitrite: NEGATIVE
Protein, ur: NEGATIVE mg/dL
Specific Gravity, Urine: 1.012 (ref 1.005–1.030)
pH: 5 (ref 5.0–8.0)

## 2018-04-02 LAB — CK: Total CK: 721 U/L — ABNORMAL HIGH (ref 38–234)

## 2018-04-02 MED ORDER — MIRTAZAPINE 30 MG PO TABS: 30.0000 mg | ORAL_TABLET | Freq: Every day | ORAL | 2 refills | Status: DC

## 2018-04-02 MED ORDER — HYDROMORPHONE HCL 1 MG/ML IJ SOLN
1.0000 mg | Freq: Once | INTRAMUSCULAR | Status: AC
  Administered 2018-04-02: 1 mg via INTRAVENOUS

## 2018-04-02 MED ORDER — GABAPENTIN 600 MG PO TABS: 1200.0000 mg | ORAL_TABLET | Freq: Two times a day (BID) | ORAL | 1 refills | Status: DC

## 2018-04-02 MED ORDER — IOHEXOL 300 MG/ML  SOLN
100.0000 mL | Freq: Once | INTRAMUSCULAR | Status: AC | PRN
  Administered 2018-04-02: 100 mL via INTRAVENOUS

## 2018-04-02 MED ORDER — HYDROMORPHONE HCL 1 MG/ML IJ SOLN
INTRAMUSCULAR | Status: AC
  Filled 2018-04-02: qty 1

## 2018-04-02 MED ORDER — SODIUM CHLORIDE 0.9 % IV BOLUS
1000.0000 mL | Freq: Once | INTRAVENOUS | Status: AC
  Administered 2018-04-02: 1000 mL via INTRAVENOUS

## 2018-04-02 MED ORDER — HYDROMORPHONE HCL 1 MG/ML IJ SOLN
1.0000 mg | Freq: Once | INTRAMUSCULAR | Status: AC
  Administered 2018-04-02: 1 mg via INTRAVENOUS
  Filled 2018-04-02: qty 1

## 2018-04-02 MED ORDER — OXYCODONE-ACETAMINOPHEN 5-325 MG PO TABS: 1.0000 | ORAL_TABLET | ORAL | 0 refills | Status: DC | PRN

## 2018-04-02 MED ORDER — OXYCODONE-ACETAMINOPHEN 5-325 MG PO TABS
2.0000 | ORAL_TABLET | Freq: Once | ORAL | Status: AC
  Administered 2018-04-02: 2 via ORAL
  Filled 2018-04-02: qty 2

## 2018-04-02 MED ORDER — FENTANYL CITRATE (PF) 100 MCG/2ML IJ SOLN
50.0000 ug | Freq: Once | INTRAMUSCULAR | Status: AC
  Administered 2018-04-02: 50 ug via INTRAVENOUS
  Filled 2018-04-02: qty 2

## 2018-04-02 MED ORDER — OXYCODONE-ACETAMINOPHEN 5-325 MG PO TABS: 1.0000 | ORAL_TABLET | Freq: Four times a day (QID) | ORAL | 0 refills | Status: DC | PRN
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
## 2018-04-02 NOTE — ED Notes (Signed)
Doctor at bedside.

## 2018-04-02 NOTE — ED Notes (Signed)
PT states understanding of care given, follow up care, and medication prescribed. PT ambulated from ED to car with a steady gait. 

## 2018-04-02 NOTE — ED Provider Notes (Signed)
Patient seen and evaluated, she has good pain control, she will have difficulty ambulating and she understands this.  She will be given a back brace which she has received, she has been informed to use it anytime she is up, she will be given a bedside commode and I have ordered home health to evaluate tomorrow for physical therapy and a home health aide.  Patient agreeable   Eber HongMiller, Teyona Nichelson, MD 04/02/18 1929

## 2018-04-02 NOTE — ED Notes (Signed)
Per previous primary nurse for this pt, PT no longer in office - ED MD will ambulate pt and determine if pt can be d/c'd to home
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
## 2018-04-02 NOTE — ED Notes (Addendum)
Care assumed at this time - resting quietly on stretcher with family at bedside; awaiting PT - pt and family aware of same
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
## 2018-04-02 NOTE — Progress Notes (Signed)
Orthopedic Tech Progress Note Patient Details:  Darleen CrockerCarol N Yaun 04/01/1971 [id number]  Patient ID: Darleen Crockerarol N Yaun, female   DOB: 07/26/1971, 47 y.o.   MRN: [id number]   Charlott HollerJennifer C KoontzCalled [employer] for Lumbar corset. 04/02/2018, 4:42 PM

## 2018-06-02 ENCOUNTER — Observation Stay (HOSPITAL_COMMUNITY)
Admission: AD | Admit: 2018-06-02 | Discharge: 2018-06-04 | Disposition: A | Discharge status: Other Acute Inpatient Hospital | Payer: BLUE CROSS/BLUE SHIELD | Source: Other Acute Inpatient Hospital | Attending: Internal Medicine | Admitting: Internal Medicine

## 2018-06-02 DIAGNOSIS — E872 Acidosis: Secondary | ICD-10-CM | POA: Diagnosis not present

## 2018-06-02 DIAGNOSIS — F17200 Nicotine dependence, unspecified, uncomplicated: Secondary | ICD-10-CM | POA: Insufficient documentation

## 2018-06-02 DIAGNOSIS — T1491XA Suicide attempt, initial encounter: Secondary | ICD-10-CM | POA: Diagnosis not present

## 2018-06-02 DIAGNOSIS — S0511XA Contusion of eyeball and orbital tissues, right eye, initial encounter: Secondary | ICD-10-CM | POA: Insufficient documentation

## 2018-06-02 DIAGNOSIS — R569 Unspecified convulsions: Secondary | ICD-10-CM

## 2018-06-02 DIAGNOSIS — T426X2A Poisoning by other antiepileptic and sedative-hypnotic drugs, intentional self-harm, initial encounter: Secondary | ICD-10-CM | POA: Diagnosis not present

## 2018-06-02 DIAGNOSIS — T7411XA Adult physical abuse, confirmed, initial encounter: Secondary | ICD-10-CM | POA: Diagnosis not present

## 2018-06-02 DIAGNOSIS — F329 Major depressive disorder, single episode, unspecified: Secondary | ICD-10-CM | POA: Insufficient documentation

## 2018-06-02 DIAGNOSIS — D72829 Elevated white blood cell count, unspecified: Secondary | ICD-10-CM | POA: Insufficient documentation

## 2018-06-02 DIAGNOSIS — F419 Anxiety disorder, unspecified: Secondary | ICD-10-CM | POA: Diagnosis not present

## 2018-06-02 DIAGNOSIS — G4700 Insomnia, unspecified: Secondary | ICD-10-CM | POA: Insufficient documentation

## 2018-06-02 DIAGNOSIS — Z79899 Other long term (current) drug therapy: Secondary | ICD-10-CM | POA: Diagnosis not present

## 2018-06-02 DIAGNOSIS — Z915 Personal history of self-harm: Secondary | ICD-10-CM | POA: Insufficient documentation

## 2018-06-02 DIAGNOSIS — Y998 Other external cause status: Secondary | ICD-10-CM | POA: Diagnosis not present

## 2018-06-02 LAB — CBC WITH DIFFERENTIAL/PLATELET
Abs Immature Granulocytes: 0 10*3/uL (ref 0.0–0.1)
Basophils Absolute: 0 10*3/uL (ref 0.0–0.1)
Basophils Relative: 0 %
Eosinophils Absolute: 0.1 10*3/uL (ref 0.0–0.7)
Eosinophils Relative: 1 %
HCT: 37.6 % (ref 36.0–46.0)
Hemoglobin: 12.2 g/dL (ref 12.0–15.0)
Immature Granulocytes: 0 %
Lymphocytes Relative: 29 %
Lymphs Abs: 2.8 10*3/uL (ref 0.7–4.0)
MCH: 30.8 pg (ref 26.0–34.0)
MCHC: 32.4 g/dL (ref 30.0–36.0)
MCV: 94.9 fL (ref 78.0–100.0)
Monocytes Absolute: 0.7 10*3/uL (ref 0.1–1.0)
Monocytes Relative: 7 %
Neutro Abs: 6 10*3/uL (ref 1.7–7.7)
Neutrophils Relative %: 63 %
Platelets: 259 10*3/uL (ref 150–400)
RBC: 3.96 MIL/uL (ref 3.87–5.11)
RDW: 13.5 % (ref 11.5–15.5)
WBC: 9.6 10*3/uL (ref 4.0–10.5)

## 2018-06-02 LAB — COMPREHENSIVE METABOLIC PANEL
ALT: 23 U/L (ref 0–44)
AST: 25 U/L (ref 15–41)
Albumin: 3.5 g/dL (ref 3.5–5.0)
Alkaline Phosphatase: 59 U/L (ref 38–126)
Anion gap: 10 (ref 5–15)
BUN: <5 mg/dL — ABNORMAL LOW (ref 6–20)
CO2: 20 mmol/L — ABNORMAL LOW (ref 22–32)
Calcium: 9 mg/dL (ref 8.9–10.3)
Chloride: 110 mmol/L (ref 98–111)
Creatinine, Ser: 0.77 mg/dL (ref 0.44–1.00)
GFR calc Af Amer: >60 mL/min (ref >60)
GFR calc non Af Amer: >60 mL/min (ref >60)
Glucose, Bld: 101 mg/dL — ABNORMAL HIGH (ref 70–99)
Potassium: 3 mmol/L — ABNORMAL LOW (ref 3.5–5.1)
Sodium: 140 mmol/L (ref 135–145)
Total Bilirubin: 0.5 mg/dL (ref 0.3–1.2)
Total Protein: 6.3 g/dL — ABNORMAL LOW (ref 6.5–8.1)

## 2018-06-02 LAB — LACTIC ACID, PLASMA: Lactic Acid, Venous: 1 mmol/L (ref 0.5–1.9)

## 2018-06-02 MED ORDER — LACTATED RINGERS IV SOLN
INTRAVENOUS | Status: AC
  Administered 2018-06-02: 100 mL/h via INTRAVENOUS

## 2018-06-02 MED ORDER — ENOXAPARIN SODIUM 40 MG/0.4ML ~~LOC~~ SOLN
40.0000 mg | SUBCUTANEOUS | Status: DC
  Administered 2018-06-02 – 2018-06-03 (×2): 40 mg via SUBCUTANEOUS
  Filled 2018-06-02 (×2): qty 0.4

## 2018-06-02 MED ORDER — ADULT MULTIVITAMIN W/MINERALS CH
1.0000 | ORAL_TABLET | Freq: Every day | ORAL | Status: DC
  Administered 2018-06-02 – 2018-06-04 (×3): 1 via ORAL
  Filled 2018-06-02 (×3): qty 1

## 2018-06-02 MED ORDER — THIAMINE HCL 100 MG/ML IJ SOLN
100.0000 mg | Freq: Every day | INTRAMUSCULAR | Status: DC
  Filled 2018-06-02: qty 2

## 2018-06-02 MED ORDER — PANTOPRAZOLE SODIUM 40 MG PO TBEC
40.0000 mg | DELAYED_RELEASE_TABLET | Freq: Every day | ORAL | Status: DC
  Administered 2018-06-02 – 2018-06-04 (×3): 40 mg via ORAL
  Filled 2018-06-02 (×3): qty 1

## 2018-06-02 MED ORDER — LORAZEPAM 1 MG PO TABS
1.0000 mg | ORAL_TABLET | Freq: Four times a day (QID) | ORAL | Status: DC | PRN
  Administered 2018-06-03 – 2018-06-04 (×4): 1 mg via ORAL
  Filled 2018-06-02 (×4): qty 1

## 2018-06-02 MED ORDER — SODIUM CHLORIDE 0.9% FLUSH
3.0000 mL | Freq: Two times a day (BID) | INTRAVENOUS | Status: DC
  Administered 2018-06-02 – 2018-06-03 (×2): 3 mL via INTRAVENOUS

## 2018-06-02 MED ORDER — FOLIC ACID 1 MG PO TABS
1.0000 mg | ORAL_TABLET | Freq: Every day | ORAL | Status: DC
  Administered 2018-06-02 – 2018-06-04 (×3): 1 mg via ORAL
  Filled 2018-06-02 (×3): qty 1

## 2018-06-02 MED ORDER — LORAZEPAM 2 MG/ML IJ SOLN
2.0000 mg | INTRAMUSCULAR | Status: DC | PRN
  Administered 2018-06-02: 1 mg via INTRAVENOUS

## 2018-06-02 MED ORDER — VITAMIN B-1 100 MG PO TABS
100.0000 mg | ORAL_TABLET | Freq: Every day | ORAL | Status: DC
  Administered 2018-06-02 – 2018-06-04 (×3): 100 mg via ORAL
  Filled 2018-06-02 (×3): qty 1

## 2018-06-02 MED ORDER — LORAZEPAM 2 MG/ML IJ SOLN
1.0000 mg | Freq: Four times a day (QID) | INTRAMUSCULAR | Status: DC | PRN
  Administered 2018-06-04: 1 mg via INTRAVENOUS
  Filled 2018-06-02 (×2): qty 1

## 2018-06-02 MED ORDER — ACETAMINOPHEN 500 MG PO TABS
1000.0000 mg | ORAL_TABLET | Freq: Three times a day (TID) | ORAL | Status: DC | PRN
  Administered 2018-06-03 (×2): 1000 mg via ORAL
  Filled 2018-06-02 (×2): qty 2

## 2018-06-02 MED ORDER — POTASSIUM CHLORIDE 20 MEQ PO PACK
40.0000 meq | PACK | Freq: Once | ORAL | Status: AC
  Administered 2018-06-02: 40 meq via ORAL
  Filled 2018-06-02: qty 2

## 2018-06-02 MED ORDER — PROMETHAZINE HCL 25 MG PO TABS
12.5000 mg | ORAL_TABLET | Freq: Four times a day (QID) | ORAL | Status: DC | PRN
  Administered 2018-06-02: 12.5 mg via ORAL
  Filled 2018-06-02: qty 1
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
## 2018-06-02 NOTE — Progress Notes (Addendum)
Patient arrived to the unit alert and oriented X 4. Denies pain Ecchymosis around the right eye. She is restless, tearful and requesting medication for her anxiety. Mom at bedsides. All questions and concerns addressed. Patient has a sitter at bedside  due to being IVC because of a suicidal Attempt. Nurse will continue to monitor. Bed in the lowest position with call light in reach.

## 2018-06-03 ENCOUNTER — Observation Stay (HOSPITAL_COMMUNITY): Payer: BLUE CROSS/BLUE SHIELD

## 2018-06-03 ENCOUNTER — Encounter (HOSPITAL_COMMUNITY): Payer: Self-pay | Admitting: *Deleted

## 2018-06-03 ENCOUNTER — Other Ambulatory Visit: Payer: Self-pay

## 2018-06-03 DIAGNOSIS — Z630 Problems in relationship with spouse or partner: Secondary | ICD-10-CM | POA: Diagnosis not present

## 2018-06-03 DIAGNOSIS — Z818 Family history of other mental and behavioral disorders: Secondary | ICD-10-CM

## 2018-06-03 DIAGNOSIS — T1491XA Suicide attempt, initial encounter: Secondary | ICD-10-CM | POA: Diagnosis not present

## 2018-06-03 DIAGNOSIS — T426X2A Poisoning by other antiepileptic and sedative-hypnotic drugs, intentional self-harm, initial encounter: Secondary | ICD-10-CM | POA: Diagnosis not present

## 2018-06-03 DIAGNOSIS — Z813 Family history of other psychoactive substance abuse and dependence: Secondary | ICD-10-CM

## 2018-06-03 DIAGNOSIS — R569 Unspecified convulsions: Secondary | ICD-10-CM | POA: Diagnosis not present

## 2018-06-03 LAB — CBC
HCT: 36 % (ref 36.0–46.0)
Hemoglobin: 11.7 g/dL — ABNORMAL LOW (ref 12.0–15.0)
MCH: 31.3 pg (ref 26.0–34.0)
MCHC: 32.5 g/dL (ref 30.0–36.0)
MCV: 96.3 fL (ref 78.0–100.0)
Platelets: 241 10*3/uL (ref 150–400)
RBC: 3.74 MIL/uL — ABNORMAL LOW (ref 3.87–5.11)
RDW: 13.5 % (ref 11.5–15.5)
WBC: 9.1 10*3/uL (ref 4.0–10.5)

## 2018-06-03 LAB — BASIC METABOLIC PANEL
Anion gap: 11 (ref 5–15)
BUN: <5 mg/dL — ABNORMAL LOW (ref 6–20)
CO2: 20 mmol/L — ABNORMAL LOW (ref 22–32)
Calcium: 8.8 mg/dL — ABNORMAL LOW (ref 8.9–10.3)
Chloride: 107 mmol/L (ref 98–111)
Creatinine, Ser: 0.68 mg/dL (ref 0.44–1.00)
GFR calc Af Amer: >60 mL/min (ref >60)
GFR calc non Af Amer: >60 mL/min (ref >60)
Glucose, Bld: 86 mg/dL (ref 70–99)
Potassium: 3.4 mmol/L — ABNORMAL LOW (ref 3.5–5.1)
Sodium: 138 mmol/L (ref 135–145)

## 2018-06-03 LAB — RAPID URINE DRUG SCREEN, HOSP PERFORMED
Amphetamines: NOT DETECTED
Barbiturates: NOT DETECTED
Benzodiazepines: NOT DETECTED
Cocaine: NOT DETECTED
Opiates: NOT DETECTED
Tetrahydrocannabinol: NOT DETECTED

## 2018-06-03 LAB — HIV ANTIBODY (ROUTINE TESTING W REFLEX): HIV Screen 4th Generation wRfx: NONREACTIVE

## 2018-06-03 MED ORDER — GADOBUTROL 1 MMOL/ML IV SOLN
5.0000 mL | Freq: Once | INTRAVENOUS | Status: AC | PRN
  Administered 2018-06-03: 5 mL via INTRAVENOUS
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
## 2018-06-03 NOTE — Progress Notes (Signed)
Skin assessment done by this nurse and Coy Saunas, on head to toe assessment no pressure ulcer noted.

## 2018-06-03 NOTE — Procedures (Signed)
ELECTROENCEPHALOGRAM REPORT   Patient: Margaret Mccann       Room #: 7O35K EEG No. ID: [id number] Age: 48 y.o.        Sex: female Referring Physician: Willette Pa Report Date:  06/03/2018        Interpreting Physician: Thana Farr  History: Margaret Mccann is an 47 y.o. female with seizure-like activity  Medications:  Folvite, MVI, Protonix, Thiamine  Conditions of Recording:  This is a 21 channel routine scalp EEG performed with bipolar and monopolar montages arranged in accordance to the international 10/20 system of electrode placement. One channel was dedicated to EKG recording.  The patient is in the awake and drowsy states.  Description:  The waking background activity consists of a low voltage, symmetrical, fairly well organized, 10 Hz alpha activity, seen from the parieto-occipital and posterior temporal regions.  Low voltage fast activity, poorly organized, is seen anteriorly and is at times superimposed on more posterior regions.  A mixture of theta and alpha rhythms are seen from the central and temporal regions. The patient drowses with slowing to irregular, low voltage theta and beta activity.   Stage II sleep is not obtained. No epileptiform activity is noted.   Hyperventilation was performed and produced a mild to moderate buildup but failed to elicit any abnormalities. Intermittent photic stimulation was performed but failed to illicit any change in the tracing.     IMPRESSION: Normal electroencephalogram, awake, drowsy and with activation procedures. There are no focal lateralizing or epileptiform features.   Thana Farr, MD Neurology [phone] 06/03/2018, 1:36 PM
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
## 2018-06-03 NOTE — Progress Notes (Signed)
EEG complete - results pending 
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
## 2018-06-04 ENCOUNTER — Other Ambulatory Visit: Payer: Self-pay | Admitting: Registered Nurse

## 2018-06-04 ENCOUNTER — Encounter (HOSPITAL_COMMUNITY): Payer: Self-pay | Admitting: *Deleted

## 2018-06-04 ENCOUNTER — Other Ambulatory Visit: Payer: Self-pay

## 2018-06-04 ENCOUNTER — Inpatient Hospital Stay (HOSPITAL_COMMUNITY)
Admission: AD | Admit: 2018-06-04 | Discharge: 2018-06-08 | DRG: 885 | Disposition: A | Discharge status: Home / Self Care | Payer: BLUE CROSS/BLUE SHIELD | Source: Intra-hospital | Attending: Psychiatry | Admitting: Psychiatry

## 2018-06-04 DIAGNOSIS — Z881 Allergy status to other antibiotic agents status: Secondary | ICD-10-CM

## 2018-06-04 DIAGNOSIS — Z915 Personal history of self-harm: Secondary | ICD-10-CM

## 2018-06-04 DIAGNOSIS — Z79899 Other long term (current) drug therapy: Secondary | ICD-10-CM | POA: Diagnosis not present

## 2018-06-04 DIAGNOSIS — G40909 Epilepsy, unspecified, not intractable, without status epilepticus: Secondary | ICD-10-CM | POA: Diagnosis present

## 2018-06-04 DIAGNOSIS — G8929 Other chronic pain: Secondary | ICD-10-CM | POA: Diagnosis present

## 2018-06-04 DIAGNOSIS — F1010 Alcohol abuse, uncomplicated: Secondary | ICD-10-CM | POA: Diagnosis not present

## 2018-06-04 DIAGNOSIS — G4700 Insomnia, unspecified: Secondary | ICD-10-CM | POA: Diagnosis present

## 2018-06-04 DIAGNOSIS — Z87891 Personal history of nicotine dependence: Secondary | ICD-10-CM

## 2018-06-04 DIAGNOSIS — F1020 Alcohol dependence, uncomplicated: Secondary | ICD-10-CM | POA: Diagnosis present

## 2018-06-04 DIAGNOSIS — R45851 Suicidal ideations: Secondary | ICD-10-CM | POA: Diagnosis present

## 2018-06-04 DIAGNOSIS — F332 Major depressive disorder, recurrent severe without psychotic features: Secondary | ICD-10-CM | POA: Diagnosis present

## 2018-06-04 DIAGNOSIS — Z888 Allergy status to other drugs, medicaments and biological substances status: Secondary | ICD-10-CM | POA: Diagnosis not present

## 2018-06-04 DIAGNOSIS — Z885 Allergy status to narcotic agent status: Secondary | ICD-10-CM

## 2018-06-04 DIAGNOSIS — F209 Schizophrenia, unspecified: Secondary | ICD-10-CM | POA: Diagnosis present

## 2018-06-04 DIAGNOSIS — F558 Abuse of other non-psychoactive substances: Secondary | ICD-10-CM | POA: Diagnosis not present

## 2018-06-04 DIAGNOSIS — F1310 Sedative, hypnotic or anxiolytic abuse, uncomplicated: Secondary | ICD-10-CM | POA: Diagnosis not present

## 2018-06-04 DIAGNOSIS — T1491XA Suicide attempt, initial encounter: Secondary | ICD-10-CM | POA: Diagnosis not present

## 2018-06-04 DIAGNOSIS — F419 Anxiety disorder, unspecified: Secondary | ICD-10-CM | POA: Diagnosis present

## 2018-06-04 DIAGNOSIS — Z23 Encounter for immunization: Secondary | ICD-10-CM

## 2018-06-04 MED ORDER — ENOXAPARIN SODIUM 40 MG/0.4ML ~~LOC~~ SOLN
40.0000 mg | SUBCUTANEOUS | Status: DC
  Filled 2018-06-04: qty 0.4

## 2018-06-04 MED ORDER — FOLIC ACID 1 MG PO TABS
1.0000 mg | ORAL_TABLET | Freq: Every day | ORAL | Status: DC
  Administered 2018-06-05 – 2018-06-08 (×4): 1 mg via ORAL
  Filled 2018-06-04 (×6): qty 1

## 2018-06-04 MED ORDER — PANTOPRAZOLE SODIUM 40 MG PO TBEC
40.0000 mg | DELAYED_RELEASE_TABLET | Freq: Every day | ORAL | Status: DC
  Administered 2018-06-05 – 2018-06-08 (×4): 40 mg via ORAL
  Filled 2018-06-04 (×6): qty 1

## 2018-06-04 MED ORDER — IBUPROFEN 600 MG PO TABS
600.0000 mg | ORAL_TABLET | Freq: Four times a day (QID) | ORAL | Status: DC | PRN
  Administered 2018-06-04 – 2018-06-07 (×5): 600 mg via ORAL
  Filled 2018-06-04 (×5): qty 1

## 2018-06-04 MED ORDER — HYDROXYZINE HCL 25 MG PO TABS
25.0000 mg | ORAL_TABLET | Freq: Three times a day (TID) | ORAL | Status: DC | PRN
  Administered 2018-06-04 – 2018-06-08 (×12): 25 mg via ORAL
  Filled 2018-06-04 (×11): qty 1

## 2018-06-04 MED ORDER — INFLUENZA VAC SPLIT QUAD 0.5 ML IM SUSY
0.5000 mL | PREFILLED_SYRINGE | INTRAMUSCULAR | Status: DC
  Filled 2018-06-04: qty 0.5

## 2018-06-04 MED ORDER — ADULT MULTIVITAMIN W/MINERALS CH
1.0000 | ORAL_TABLET | Freq: Every day | ORAL | Status: DC
  Administered 2018-06-05 – 2018-06-08 (×4): 1 via ORAL
  Filled 2018-06-04 (×6): qty 1

## 2018-06-04 MED ORDER — TRAZODONE HCL 50 MG PO TABS
50.0000 mg | ORAL_TABLET | Freq: Every evening | ORAL | Status: DC | PRN
  Administered 2018-06-04 (×2): 50 mg via ORAL
  Filled 2018-06-04 (×8): qty 1

## 2018-06-04 NOTE — Progress Notes (Signed)
Patient discharging to behavioral health Via sheriff escort. All paper sent with sheriff.

## 2018-06-04 NOTE — Progress Notes (Signed)
Discharge to: [facility] Anticipated discharge date: 06/04/18 Transportation by: [facility]  Transport set for a 3:30 PM pick up. Patient will be going to room 403- bed 1. Accepting physician is Dr. Jola Babinski.  Nurse to call report to [phone]. Nurse to call report prior to patient pick-up.  CSW signing off.  Blenda Nicely LCSW [phone]

## 2018-06-04 NOTE — Progress Notes (Signed)
Margaret Mccann is a 47 year old female pt admitted on voluntary basis. She reports that she did take overdose on medications but reports that it was an accident and she denies any SI at this time. She reports that she has been taking her medications as prescribed and reports that she feels they are working good for her and would like to be placed back on them while she is here. She denies any substance abuse issues. She reports that she is living with her fiancee and reports that she will go back there eventually. She reports that her fiancee is using drugs and wasn't going to meeting which led to the argument but reports that it is a safe place for her. She reports that she will go stay with her parents for a little while before going back to live with her fiancee. [place] was escorted to the unit, oriented to the milieu and safety maintained.

## 2018-06-04 NOTE — Progress Notes (Signed)
Nurse attempted to call report to Behavioral health hospital. No one answered the phone.

## 2018-06-04 NOTE — Tx Team (Signed)
Initial Treatment Plan 06/04/2018 6:24 PM Margaret Mccann RDE:[id number]    PATIENT STRESSORS: Marital or family conflict   PATIENT STRENGTHS: Ability for insight Average or above average intelligence Capable of independent living General fund of knowledge Motivation for treatment/growth   PATIENT IDENTIFIED PROBLEMS: Depression Suicidal behavior "Just want to get stabilized back on my medicine"                     DISCHARGE CRITERIA:  Ability to meet basic life and health needs Improved stabilization in mood, thinking, and/or behavior Reduction of life-threatening or endangering symptoms to within safe limits Verbal commitment to aftercare and medication compliance  PRELIMINARY DISCHARGE PLAN: Attend aftercare/continuing care group Return to previous living arrangement  PATIENT/FAMILY INVOLVEMENT: This treatment plan has been presented to and reviewed with the patient, Margaret Mccann, and/or family member, .  The patient and family have been given the opportunity to ask questions and make suggestions.  Lynnae Ludemann, [place], [place] 06/04/2018, 6:24 PM

## 2018-06-04 NOTE — Progress Notes (Signed)
Nurse attempted to call report again no answer.

## 2018-06-04 NOTE — Clinical Social Work Note (Signed)
Clinical Social Work Assessment  Patient Details  Name: Margaret Mccann MRN: [id number] Date of Birth: [birth year]  Date of referral:  06/04/18               Reason for consult:  Mental Health Concerns                Permission sought to share information with:  Facility Industrial/product designer granted to share information::  No  Name::        Agency::  [facility]  Relationship::     Contact Information:     Housing/Transportation Living arrangements for the past 2 months:  Single Family Home Source of Information:  Medical Team Patient Interpreter Needed:  None Criminal Activity/Legal Involvement Pertinent to Current Situation/Hospitalization:  No - Comment as needed Significant Relationships:  Media planner, Parents Lives with:  Self, Significant Other Do you feel safe going back to the place where you live?  Yes Need for family participation in patient care:  No (Coment)  Care giving concerns:  Patient under IVC due to suicide attempt; will need inpatient psychiatric care to ensure her safety prior to return home.   Social Worker assessment / plan:  CSW alerted by medical team that patient will require inpatient care and that patient is medically stable for transport. CSW sent referral and confirmed bed availability at [facility]. CSW coordinated transfer.  Employment status:  Other (Comment) Insurance information:  Managed Care PT Recommendations:  Not assessed at this time Information / Referral to community resources:  Inpatient Psychiatric Care (Comment Required)  Patient/Family's Response to care:  Patient is under involuntary commitment due to risk of harming herself.  Patient/Family's Understanding of and Emotional Response to Diagnosis, Current Treatment, and Prognosis:  Patient has been withdrawn and not forthcoming with information when asked. Patient does acknowledge that she had overdosed, and has acknowledged some social stressors.   Emotional  Assessment Appearance:  Appears stated age Attitude/Demeanor/Rapport:  Engaged Affect (typically observed):  Appropriate Orientation:  Oriented to Self, Oriented to Place, Oriented to  Time, Oriented to Situation Alcohol / Substance use:  Not Applicable Psych involvement (Current and /or in the community):  Outpatient Provider  Discharge Needs  Concerns to be addressed:  Care Coordination Readmission within the last 30 days:  No Current discharge risk:  Psychiatric Illness Barriers to Discharge:  No Barriers Identified   Baldemar Lenis, LCSW 06/04/2018, 3:22 PM
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
## 2018-06-05 DIAGNOSIS — F558 Abuse of other non-psychoactive substances: Secondary | ICD-10-CM

## 2018-06-05 DIAGNOSIS — F1010 Alcohol abuse, uncomplicated: Secondary | ICD-10-CM

## 2018-06-05 DIAGNOSIS — F332 Major depressive disorder, recurrent severe without psychotic features: Principal | ICD-10-CM

## 2018-06-05 DIAGNOSIS — F1310 Sedative, hypnotic or anxiolytic abuse, uncomplicated: Secondary | ICD-10-CM

## 2018-06-05 MED ORDER — FLUOXETINE HCL 20 MG PO CAPS
20.0000 mg | ORAL_CAPSULE | Freq: Every day | ORAL | Status: DC
  Administered 2018-06-05 – 2018-06-08 (×4): 20 mg via ORAL
  Filled 2018-06-05 (×7): qty 1

## 2018-06-05 MED ORDER — MIRTAZAPINE 15 MG PO TABS
15.0000 mg | ORAL_TABLET | Freq: Every day | ORAL | Status: DC
  Administered 2018-06-05 – 2018-06-07 (×3): 15 mg via ORAL
  Filled 2018-06-05 (×6): qty 1

## 2018-06-05 NOTE — Progress Notes (Signed)
Adult Psychoeducational Group Note  Date:  06/05/2018 Time:  2:20 AM  Group Topic/Focus:  Wrap-Up Group:   The focus of this group is to help patients review their daily goal of treatment and discuss progress on daily workbooks.  Participation Level:  Did Not Attend  Participation Quality:  Pt did not attend  Affect:  Pt did not attend  Cognitive:  Pt did not attend  Insight: None  Engagement in Group:  Pt did not attend  Modes of Intervention:  Pt did not attend  Additional Comments:  Pt did not attend evening wrap up group tonight.  Felipa Furnace 06/05/2018, 2:20 AM

## 2018-06-05 NOTE — Progress Notes (Signed)
Pt reports today has been difficult.  She is not happy that her home meds were not reordered.  Writer tried to explain that if any of the meds were involve in her overdose, the doctor would mos likely not reorder them.  She says she did not sleep well with the Trazodone last night and again requested Remeron.  Writer discussed with the evening provider and Remeron was ordered in a lesser dose at 15 mg than her home dose.  The trazodone was discontinued.  Pt was pleased with this change and also requested and was given Motrin for her chronic back pain. Pt denies SI/HI/AVH at this time.  Pt denies any withdrawal symptoms.  Pt has been pleasant and cooperative.  Support and encouragement offered.  Discharge plans are in process.  Safety maintained with q15 minute checks.

## 2018-06-05 NOTE — [facility] Group Notes (Signed)
[facility] Mental Health Association Group Therapy 06/05/2018 1:15pm  Type of Therapy: Mental Health Association Presentation  Participation Level: Pt invited. Chose to remain in bed.   Rona Ravens, LCSW 06/05/2018 9:22 AM
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
## 2018-06-05 NOTE — Progress Notes (Signed)
Pt is new to the unit just prior to the shift change.  Pt contracts for safety on the unit and denies HI/AVH.  She was educated on unit evening activities and encouraged to make her needs known to staff.  Pt reports she has back pain from an injury so writer obtained an order of Ibuprofen 600 mg q6h prn along with a sleep aid and Vistaril for anxiety.  Pt is concerned that she is not getting her Ativan as she was in the main hospital.  Pt was encouraged to speak to her provider in the morning about her concerns.  Pt has been pleasant and cooperative.  Support and encouragement offered.  Discharge plans are in process.  Safety maintained with q15 minute checks.

## 2018-06-05 NOTE — Progress Notes (Signed)
Recreation Therapy Notes  Date: 9.11.19 Time: 0930 Location: 300 Hall Dayroom  Group Topic: Stress Management  Goal Area(s) Addresses:  Patient will verbalize importance of using healthy stress management.  Patient will identify positive emotions associated with healthy stress management.   Intervention: Stress Management  Activity :  Guided Imagery.  LRT introduced the stress management technique of guided imagery.  LRT read Mccann script for patients to envision seeing Mccann starry sky at night.  Patients were to follow along as script was read to engage in activity.  Education:  Stress Management, Discharge Planning.   Education Outcome: Acknowledges edcuation/In group clarification offered/Needs additional education  Clinical Observations/Feedback: Pt did not attend group.    Margaret Mccann, LRT/CTRS         Margaret Mccann 06/05/2018 12:06 PM 
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
## 2018-06-05 NOTE — Tx Team (Signed)
Interdisciplinary Treatment and Diagnostic Plan Update  06/05/2018 Time of Session: 0935 Margaret Mccann MRN: [id number]  Principal Diagnosis: <principal problem not specified>  Secondary Diagnoses: Active Problems:   MDD (major depressive disorder), recurrent episode, severe (HCC)   Schizophrenia (HCC)   Current Medications:  Current Facility-Administered Medications  Medication Dose Route Frequency Provider Last Rate Last Dose  . FLUoxetine (PROZAC) capsule 20 mg  20 mg Oral Daily Sharma Covert, MD   20 mg at 06/05/18 0837  . folic acid (FOLVITE) tablet 1 mg  1 mg Oral Daily Rankin, Shuvon B, NP   1 mg at 06/05/18 0823  . hydrOXYzine (ATARAX/VISTARIL) tablet 25 mg  25 mg Oral TID PRN Rozetta Nunnery, NP   25 mg at 06/05/18 0826  . ibuprofen (ADVIL,MOTRIN) tablet 600 mg  600 mg Oral Q6H PRN Lindon Romp A, NP   600 mg at 06/04/18 2209  . Influenza vac split quadrivalent PF (FLUARIX) injection 0.5 mL  0.5 mL Intramuscular Tomorrow-1000 Sharma Covert, MD      . multivitamin with minerals tablet 1 tablet  1 tablet Oral Daily Rankin, Shuvon B, NP   1 tablet at 06/05/18 0823  . pantoprazole (PROTONIX) EC tablet 40 mg  40 mg Oral Daily Rankin, Shuvon B, NP   40 mg at 06/05/18 0823  . traZODone (DESYREL) tablet 50 mg  50 mg Oral QHS,MR X 1 Lindon Romp A, NP   50 mg at 06/04/18 2309   PTA Medications: Medications Prior to Admission  Medication Sig Dispense Refill Last Dose  . buPROPion (WELLBUTRIN XL) 150 MG 24 hr tablet Take 150 mg by mouth every morning.   06/01/2018  . buPROPion (WELLBUTRIN XL) 300 MG 24 hr tablet Take 300 mg by mouth every morning.   06/01/2018  . FLUoxetine (PROZAC) 40 MG capsule Take 40 mg by mouth every morning.   06/01/2018  . gabapentin (NEURONTIN) 600 MG tablet Take 1,200 mg by mouth 2 (two) times daily. Per patient - She takes two (668m) Tablets in the morning and two (6039m Tablets at bedtime.   06/01/2018  . LORazepam (ATIVAN) 2 MG tablet Take 1-2  mg by mouth every 6 (six) hours as needed for anxiety.   06/01/2018  . mirtazapine (REMERON) 30 MG tablet Take 30 mg by mouth at bedtime.   06/01/2018  . Norethindrone Acetate-Ethinyl Estrad-FE (BLISOVI 24 FE) 1-20 MG-MCG(24) tablet Take 1 tablet by mouth daily.   06/01/2018    Patient Stressors: Marital or family conflict  Patient Strengths: Ability for insight Average or above average intelligence Capable of independent living [employer] knowledge Motivation for treatment/growth  Treatment Modalities: Medication Management, Group therapy, Case management,  1 to 1 session with clinician, Psychoeducation, Recreational therapy.   Physician Treatment Plan for Primary Diagnosis: <principal problem not specified> Long Term Goal(s): Improvement in symptoms so as ready for discharge Improvement in symptoms so as ready for discharge   Short Term Goals: Ability to identify changes in lifestyle to reduce recurrence of condition will improve Ability to verbalize feelings will improve Ability to disclose and discuss suicidal ideas Ability to demonstrate self-control will improve Ability to identify and develop effective coping behaviors will improve Ability to maintain clinical measurements within normal limits will improve Compliance with prescribed medications will improve Ability to identify triggers associated with substance abuse/mental health issues will improve Ability to identify changes in lifestyle to reduce recurrence of condition will improve Ability to verbalize feelings will improve Ability to disclose  and discuss suicidal ideas Ability to demonstrate self-control will improve Ability to identify and develop effective coping behaviors will improve Ability to maintain clinical measurements within normal limits will improve Compliance with prescribed medications will improve Ability to identify triggers associated with substance abuse/mental health issues will  improve  Medication Management: Evaluate patient's response, side effects, and tolerance of medication regimen.  Therapeutic Interventions: 1 to 1 sessions, Unit Group sessions and Medication administration.  Evaluation of Outcomes: Not Met  Physician Treatment Plan for Secondary Diagnosis: Active Problems:   MDD (major depressive disorder), recurrent episode, severe ([place])   Schizophrenia ([place])  Long Term Goal(s): Improvement in symptoms so as ready for discharge Improvement in symptoms so as ready for discharge   Short Term Goals: Ability to identify changes in lifestyle to reduce recurrence of condition will improve Ability to verbalize feelings will improve Ability to disclose and discuss suicidal ideas Ability to demonstrate self-control will improve Ability to identify and develop effective coping behaviors will improve Ability to maintain clinical measurements within normal limits will improve Compliance with prescribed medications will improve Ability to identify triggers associated with substance abuse/mental health issues will improve Ability to identify changes in lifestyle to reduce recurrence of condition will improve Ability to verbalize feelings will improve Ability to disclose and discuss suicidal ideas Ability to demonstrate self-control will improve Ability to identify and develop effective coping behaviors will improve Ability to maintain clinical measurements within normal limits will improve Compliance with prescribed medications will improve Ability to identify triggers associated with substance abuse/mental health issues will improve     Medication Management: Evaluate patient's response, side effects, and tolerance of medication regimen.  Therapeutic Interventions: 1 to 1 sessions, Unit Group sessions and Medication administration.  Evaluation of Outcomes: Not Met   RN Treatment Plan for Primary Diagnosis: <principal problem not specified> Long Term  Goal(s): Knowledge of disease and therapeutic regimen to maintain health will improve  Short Term Goals: Ability to identify and develop effective coping behaviors will improve and Compliance with prescribed medications will improve  Medication Management: RN will administer medications as ordered by provider, will assess and evaluate patient's response and provide education to patient for prescribed medication. RN will report any adverse and/or side effects to prescribing provider.  Therapeutic Interventions: 1 on 1 counseling sessions, Psychoeducation, Medication administration, Evaluate responses to treatment, Monitor vital signs and CBGs as ordered, Perform/monitor CIWA, COWS, AIMS and Fall Risk screenings as ordered, Perform wound care treatments as ordered.  Evaluation of Outcomes: Not Met   LCSW Treatment Plan for Primary Diagnosis: <principal problem not specified> Long Term Goal(s): Safe transition to appropriate next level of care at discharge, Engage patient in therapeutic group addressing interpersonal concerns.  Short Term Goals: Engage patient in aftercare planning with referrals and resources, Increase social support and Increase skills for wellness and recovery  Therapeutic Interventions: Assess for all discharge needs, 1 to 1 time with Social worker, Explore available resources and support systems, Assess for adequacy in community support network, Educate family and significant other(s) on suicide prevention, Complete Psychosocial Assessment, Interpersonal group therapy.  Evaluation of Outcomes: Not Met   Progress in Treatment: Attending groups: No. Participating in groups: No. Taking medication as prescribed: Yes. Toleration medication: Yes. Family/Significant other contact made: Yes, individual(s) contacted:  mother Patient understands diagnosis: No. Discussing patient identified problems/goals with staff: Yes. Medical problems stabilized or resolved: Yes. Denies  suicidal/homicidal ideation: Yes. Issues/concerns per patient self-inventory: No. Other: none  New problem(s) identified: No, Describe:  none  New Short Term/Long Term Goal(s):  Patient Goals:  "get my meds back stable, think clearly"  Discharge Plan or Barriers:   Reason for Continuation of Hospitalization: Depression Medication stabilization  Estimated Length of Stay: 3-5 days.  Attendees: Patient: Margaret Mccann 06/05/2018   Physician: Dr. Mallie Darting, MD 06/05/2018   Nursing: Baldo Daub, RN 06/05/2018   RN Care Manager: 06/05/2018   Social Worker: Lurline Idol, LCSW 06/05/2018   Recreational Therapist:  06/05/2018   Other:  06/05/2018   Other:  06/05/2018  Other: 06/05/2018        Scribe for Treatment Team: Joanne Chars, [place] 06/05/2018 2:34 PM
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
## 2018-06-05 NOTE — Progress Notes (Signed)
Nursing Progress Note [phone]  Data: Patient presents with sad/sullen affect and depressed mood. Patient has been isolating to he room today but is complaint with scheduled medications. Patient denies pain/physical complaints but did endorse some anxiety this morning. Patient provided but declined to complete their self-inventory sheet. Patient currently denies SI/HI/AVH.   Action: Patient educated about and provided medication per provider's orders. Patient safety maintained with q15 min safety checks and frequent rounding. High fall risk precautions in place. Emotional support given. 1:1 interaction and active listening provided. Patient encouraged to attend meals and groups. Patient encouraged to work on treatment plan and goals. Labs, vital signs and patient behavior monitored throughout shift.   Response: Patient remains safe on the unit at this time. Patient denies further concerns at this time. Will continue to support and monitor.

## 2018-06-06 NOTE — Plan of Care (Signed)
  Problem: Safety: Goal: Periods of time without injury will increase Outcome: Progressing   Problem: Coping: Goal: Ability to demonstrate self-control will improve Outcome: Not Progressing  Pt appeared to have a hard time coping earlier. Pt observed crying uncontrollably after a phone call. Pt then requested to take Vistaril.

## 2018-06-06 NOTE — Progress Notes (Signed)
Patient stated in group that she had a "rough day" overall. She did however mention that she had a good visit with her mother. Her goal for tomorrow is to go to the cafeteria.
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
## 2018-06-06 NOTE — Progress Notes (Signed)
Pt presents with a flat affect and depressed mood. Pt reported decreased depression and increased anxiety this morning. Pt denies SI/HI. Pt reported fair sleep last night. Pt requested to be started back on Neurontin. MD made aware of pt's request. No alcohol withdrawal symptoms verbalized by pt. Pt compliant with taking meds. No side effects verbalized by pt.  Medications reviewed with pt. Verbal support provided. Pt encouraged to attend groups. 15 minute checks performed for safety.  Pt compliant with tx plan.

## 2018-06-06 NOTE — [facility] Group Notes (Signed)
Adult Psychoeducational Group Note  Date:  06/06/2018 Time:  10:12 PM  Group Topic/Focus:  Wrap-Up Group:   The focus of this group is to help patients review their daily goal of treatment and discuss progress on daily workbooks.  Participation Level:  Active  Participation Quality:  Appropriate and Attentive  Affect:  Appropriate  Cognitive:  Alert and Appropriate  Insight: Appropriate and Good  Engagement in Group:  Engaged  Modes of Intervention:  Discussion and Education  Additional Comments:  Pt attended and participated in wrap up group this evening. Pt rated their day an 8/10, due to them [facility] and being able to see their mother two days in a row during visitation. Pt goal was to pay attention to the back pain that they have been experiencing. Pt is still experiencing some pain.   Chrisandra NettersOctavia A Trace Cederberg 06/06/2018, 10:12 PM

## 2018-06-06 NOTE — [facility] Group Notes (Signed)
[facility] LCSW Group Therapy Note  Date/Time: 06/06/18, 1315  Type of Therapy/Topic:  Group Therapy:  Balance in Life  Participation Level:  moderate  Description of Group:    This group will address the concept of balance and how it feels and looks when one is unbalanced. Patients will be encouraged to process areas in their lives that are out of balance, and identify reasons for remaining unbalanced. Facilitators will guide patients utilizing problem- solving interventions to address and correct the stressor making their life unbalanced. Understanding and applying boundaries will be explored and addressed for obtaining  and maintaining a balanced life. Patients will be encouraged to explore ways to assertively make their unbalanced needs known to significant others in their lives, using other group members and facilitator for support and feedback.  Therapeutic Goals: 1. Patient will identify two or more emotions or situations they have that consume much of in their lives. 2. Patient will identify signs/triggers that life has become out of balance:  3. Patient will identify two ways to set boundaries in order to achieve balance in their lives:  4. Patient will demonstrate ability to communicate their needs through discussion and/or role plays  Summary of Patient Progress:Pt shared that unhealthy relationship choises are consuming her life currently. Pt not very active in group discussion regarding ways to set healthy boundaries in order to maintain or regain balance in life, but she did respond to CSW questions and made several comments.            Therapeutic Modalities:   Cognitive Behavioral Therapy Solution-Focused Therapy Assertiveness Training  Daleen SquibbGreg Adelisa Satterwhite, [place]

## 2018-06-07 MED ORDER — QUETIAPINE FUMARATE 100 MG PO TABS
100.0000 mg | ORAL_TABLET | Freq: Every day | ORAL | Status: DC
  Administered 2018-06-07: 100 mg via ORAL
  Filled 2018-06-07 (×3): qty 1

## 2018-06-07 MED ORDER — ACAMPROSATE CALCIUM 333 MG PO TBEC
666.0000 mg | DELAYED_RELEASE_TABLET | Freq: Three times a day (TID) | ORAL | Status: DC
  Administered 2018-06-07 – 2018-06-08 (×3): 666 mg via ORAL
  Filled 2018-06-07 (×6): qty 2

## 2018-06-07 NOTE — Progress Notes (Signed)
Pt reports her day has been "ok".  She still presents as sad/depressed.  She had a visit with her mother this evening which pt reports was good for her, and said her mother is her "rock".  She denies SI/HI/AVH.  She is receiving Motrin for her chronic back pain as ordered.  She was also given a heat pack for comfort.  She has been pleasant and cooperative with staff.  Support and encouragement offered.  Discharge plans are in process.  Safety maintained with q15 minute checks.

## 2018-06-07 NOTE — [facility] Group Notes (Signed)
LCSW Group Therapy Note 06/07/2018 2:16 PM  Type of Therapy/Topic: Group Therapy: Emotion Regulation  Participation Level: Did Not Attend   Description of Group:  The purpose of this group is to assist patients in learning to regulate negative emotions and experience positive emotions. Patients will be guided to discuss ways in which they have been vulnerable to their negative emotions. These vulnerabilities will be juxtaposed with experiences of positive emotions or situations, and patients will be challenged to use positive emotions to combat negative ones. Special emphasis will be placed on coping with negative emotions in conflict situations, and patients will process healthy conflict resolution skills.  Therapeutic Goals: 1. Patient will identify two positive emotions or experiences to reflect on in order to balance out negative emotions 2. Patient will label two or more emotions that they find the most difficult to experience 3. Patient will demonstrate positive conflict resolution skills through discussion and/or role plays  Summary of Patient Progress:  Invited, chose not to attend.    Therapeutic Modalities:  Cognitive Behavioral Therapy Feelings Identification Dialectical Behavioral Therapy   Margaret Mccann LCSWA Clinical Social Worker

## 2018-06-07 NOTE — Plan of Care (Signed)
Progress Note  D: pt found in the dayroom; pt compliant with medication administration. Pt denies any physical pain rating this a 0/10. Pt denies any si/hi/ah/vh and verbally agrees to approach staff before [facility]. Pt is anxious about her discharge planning. Pt has been given her daughters phone number and will follow up with her niece to see if she can stay there. Pt refused to fill out her self inventory.  A: pt provided support and encouragement. Pt given medications per protocol and standing orders. Q2369m safety checks implemented and continued.  R: pt safe on the unit. Will continue to monitor.   Pt progressing in the following metrics  Problem: Education: Goal: Knowledge of [place] [facility] Education information/materials will improve Outcome: Progressing Goal: Emotional status will improve Outcome: Progressing Goal: Mental status will improve Outcome: Progressing Goal: Verbalization of understanding the information provided will improve Outcome: Progressing   Problem: Activity: Goal: Interest or engagement in activities will improve Outcome: Progressing Goal: Sleeping patterns will improve Outcome: Progressing   Problem: Coping: Goal: Ability to verbalize frustrations and anger appropriately will improve Outcome: Progressing

## 2018-06-07 NOTE — Progress Notes (Signed)
[facility] MD Progress Note  06/07/2018 1:16 PM Margaret Mccann  MRN:  [id number] Subjective: Patient is seen and examined.  Patient is a 47 year old female with a past psychiatric history significant for substance dependence and recent intentional overdose.  She is seen in follow-up.  She states she feels better today.  Her disorganization and confusion seems to be clearing.  She is sleeping well with the Remeron.  She stated her mood is improved.  Her thoughts are much less cloudy than they had been.  She does admit that she has substance issue problems.  Unfortunately the patient has a court date on Monday, and we discussed the fact that we would not be able to get her into a rehabilitation facility at least until she gets these core issues resolved.  The court charge has something to do with cocaine possession when the police came because of the disturbance at her home.  She once again reiterated that she is "strong within" the AA community and that she has that available.  We discussed the fact that she really should not be on Wellbutrin because of her underlying seizure disorder, and that the gabapentin mix with alcohol is a terrible combination.  She understands why have removed these.  She continues to complain of pain issues, is receiving Motrin for that.  She denied any suicidal ideation today. Principal Problem: <principal problem not specified> Diagnosis:   Patient Active Problem List   Diagnosis Date Noted  . MDD (major depressive disorder), recurrent episode, severe (HCC) [F33.2] 06/04/2018  . Schizophrenia (HCC) [F20.9] 06/04/2018  . Suicide attempt (HCC) [T14.91XA]   . Seizure (HCC) [R56.9] 06/02/2018   Total Time spent with patient: 15 minutes  Past Psychiatric History: See admission H&P  Past Medical History: History reviewed. No pertinent past medical history. History reviewed. No pertinent surgical history. Family History: History reviewed. No pertinent family  history. Family Psychiatric  History: See admission H&P Social History:  Social History   Substance and Sexual Activity  Alcohol Use Not Currently     Social History   Substance and Sexual Activity  Drug Use Not Currently    Social History   Socioeconomic History  . Marital status: Unknown    Spouse name: Not on file  . Number of children: Not on file  . Years of education: Not on file  . Highest education level: Not on file  Occupational History  . Not on file  Social Needs  . Financial resource strain: Not on file  . Food insecurity:    Worry: Not on file    Inability: Not on file  . Transportation needs:    Medical: Not on file    Non-medical: Not on file  Tobacco Use  . Smoking status: Former Games developer  . Smokeless tobacco: Never Used  Substance and Sexual Activity  . Alcohol use: Not Currently  . Drug use: Not Currently  . Sexual activity: Yes  Lifestyle  . Physical activity:    Days per week: Not on file    Minutes per session: Not on file  . Stress: Not on file  Relationships  . Social connections:    Talks on phone: Not on file    Gets together: Not on file    Attends religious service: Not on file    Active member of club or organization: Not on file    Attends meetings of clubs or organizations: Not on file    Relationship status: Not on file  Other Topics Concern  .  Not on file  Social History Narrative  . Not on file   Additional Social History:                         Sleep: Fair  Appetite:  Fair  Current Medications: Current Facility-Administered Medications  Medication Dose Route Frequency Provider Last Rate Last Dose  . acamprosate (CAMPRAL) tablet 666 mg  666 mg Oral TID WC Antonieta Pertlary, Amyjo Mizrachi Lawson, MD   666 mg at 06/07/18 1202  . FLUoxetine (PROZAC) capsule 20 mg  20 mg Oral Daily Antonieta Pertlary, Deborha Moseley Lawson, MD   20 mg at 06/07/18 0809  . folic acid (FOLVITE) tablet 1 mg  1 mg Oral Daily Rankin, Shuvon B, NP   1 mg at 06/07/18 0809  .  hydrOXYzine (ATARAX/VISTARIL) tablet 25 mg  25 mg Oral TID PRN Nira ConnBerry, Jason A, NP   25 mg at 06/07/18 [id number]  . ibuprofen (ADVIL,MOTRIN) tablet 600 mg  600 mg Oral Q6H PRN Nira ConnBerry, Jason A, NP   600 mg at 06/06/18 2012  . Influenza vac split quadrivalent PF (FLUARIX) injection 0.5 mL  0.5 mL Intramuscular Tomorrow-1000 Antonieta Pertlary, Nona Gracey Lawson, MD      . mirtazapine (REMERON) tablet 15 mg  15 mg Oral QHS Nira ConnBerry, Jason A, NP   15 mg at 06/06/18 2139  . multivitamin with minerals tablet 1 tablet  1 tablet Oral Daily Rankin, Shuvon B, NP   1 tablet at 06/07/18 0809  . pantoprazole (PROTONIX) EC tablet 40 mg  40 mg Oral Daily Rankin, Shuvon B, NP   40 mg at 06/07/18 0809  . QUEtiapine (SEROQUEL) tablet 100 mg  100 mg Oral QHS Antonieta Pertlary, Naveyah Iacovelli Lawson, MD        Lab Results: No results found for this or any previous visit (from the past 48 hour(s)).  Blood Alcohol level:  No results found for: [facility]  Metabolic Disorder Labs: No results found for: HGBA1C, MPG No results found for: PROLACTIN No results found for: CHOL, TRIG, HDL, CHOLHDL, VLDL, LDLCALC  Physical Findings: AIMS: Facial and Oral Movements Muscles of Facial Expression: None, normal Lips and Perioral Area: None, normal Jaw: None, normal Tongue: None, normal,Extremity Movements Upper (arms, wrists, hands, fingers): None, normal Lower (legs, knees, ankles, toes): None, normal, Trunk Movements Neck, shoulders, hips: None, normal, Overall Severity Severity of abnormal movements (highest score from questions above): None, normal Incapacitation due to abnormal movements: None, normal Patient's awareness of abnormal movements (rate only patient's report): No Awareness, Dental Status Current problems with teeth and/or dentures?: No Does patient usually wear dentures?: No  CIWA:    COWS:     Musculoskeletal: Strength & Muscle Tone: within normal limits Gait & Station: normal Patient leans: N/A  Psychiatric Specialty Exam: Physical Exam  Nursing  note and vitals reviewed. Constitutional: She is oriented to person, place, and time. She appears well-developed and well-nourished.  HENT:  Head: Normocephalic and atraumatic.  Respiratory: Effort normal.  Neurological: She is oriented to person, place, and time.    ROS  Blood pressure 106/87, pulse 79, temperature 98.7 F (37.1 C), temperature source Oral, resp. rate 16, height 5\' 3"  (1.6 m), weight 54.4 kg.Body mass index is 21.26 kg/m.  General Appearance: Casual  Eye Contact:  Fair  Speech:  Normal Rate  Volume:  Decreased  Mood:  Depressed  Affect:  Congruent  Thought Process:  Coherent and Descriptions of Associations: Intact  Orientation:  Full (Time, Place, and Person)  Thought Content:  Logical  Suicidal Thoughts:  No  Homicidal Thoughts:  No  Memory:  Immediate;   Fair Recent;   Fair Remote;   Fair  Judgement:  Intact  Insight:  Lacking  Psychomotor Activity:  Normal  Concentration:  Concentration: Fair and Attention Span: Fair  Recall:  [employer] of Knowledge:  Fair  Language:  Fair  Akathisia:  Negative  Handed:  Right  AIMS (if indicated):     Assets:  Communication Skills Desire for Improvement Housing Physical Health Resilience Social Support  ADL's:  Intact  Cognition:  WNL  Sleep:  Number of Hours: 5     Treatment Plan Summary: Daily contact with patient to assess and evaluate symptoms and progress in treatment, Medication management and Plan : Patient is seen and examined.  Patient is a 47 year old female with the above-stated past psychiatric history who is seen in follow-up.  #1 depression-continue fluoxetine 20 mg p.o. daily as well as mirtazapine 15 mg p.o. nightly.  She continues to complain of sleep issues, and we will start Seroquel 50 mg p.o. nightly.  #2 alcohol dependence-she continues on folic acid and thiamine.  Vital signs have been stable.  No Librium has been required.  We did discuss Campral today, and I am going to start her on  Campral 333 milligrams 2 tabs p.o. 3 times daily.  #3 chronic pain continue ibuprofen as needed.  #4 disposition planning-patient has court hearing on 9/16.  The plan will be to discharge her on 9/15 to be able to get home and to prepare for this.  Antonieta Pert, MD 06/07/2018, 1:16 PM

## 2018-06-07 NOTE — Progress Notes (Signed)
D   Pt is upset this evening saying she just received a phone call saying her uncle is dying and she needs to leave the hospital   Pt was informed the doctor would need to sign her release in order for her to go and the doctor would be in in the morning pt verbalized understanding  A   Verbal support given   Medications administered and effectiveness monitored   Q 15 min checks  R   Pt is safe at this time

## 2018-06-08 MED ORDER — QUETIAPINE FUMARATE 100 MG PO TABS: 100.0000 mg | ORAL_TABLET | Freq: Every day | ORAL | 0 refills | Status: DC

## 2018-06-08 MED ORDER — FLUOXETINE HCL 20 MG PO CAPS: 20.0000 mg | ORAL_CAPSULE | Freq: Every day | ORAL | 0 refills | Status: DC

## 2018-06-08 MED ORDER — HYDROXYZINE HCL 25 MG PO TABS: 25.0000 mg | ORAL_TABLET | Freq: Three times a day (TID) | ORAL | 0 refills | Status: DC | PRN

## 2018-06-08 MED ORDER — PANTOPRAZOLE SODIUM 40 MG PO TBEC: 40.0000 mg | DELAYED_RELEASE_TABLET | Freq: Every day | ORAL | 0 refills | Status: DC

## 2018-06-08 MED ORDER — MIRTAZAPINE 15 MG PO TABS: 15.0000 mg | ORAL_TABLET | Freq: Every day | ORAL | 0 refills | Status: DC

## 2018-06-08 NOTE — [facility] Group Notes (Signed)
[facility] Group Notes: (Clinical Social Work)   06/08/2018      Type of Therapy:  Group Therapy   Participation Level:  Did Not Attend  - was present at the beginning of group, but then was called out to be discharged   Margaret MantleMareida Grossman-Orr, LCSW 06/08/2018, 12:38 PM

## 2018-06-08 NOTE — [facility] Suicide Risk Assessment (Signed)
[facility]   Principal Problem: <principal problem not specified> Discharge Diagnoses:  Patient Active Problem List   Diagnosis Date Noted  . MDD (major depressive disorder), recurrent episode, severe (HCC) [F33.2] 06/04/2018  . Schizophrenia (HCC) [F20.9] 06/04/2018  . Suicide attempt (HCC) [T14.91XA]   . Seizure (HCC) [R56.9] 06/02/2018    Total Time spent with patient: 15 minutes  Musculoskeletal: Strength & Muscle Tone: within normal limits Gait & Station: normal Patient leans: N/A  Psychiatric Specialty Exam: Review of Systems  All other systems reviewed and are negative.   Blood pressure 120/88, pulse 89, temperature 98.7 F (37.1 C), temperature source Oral, resp. rate 16, height 5\' 3"  (1.6 m), weight 54.4 kg.Body mass index is 21.26 kg/m.  General Appearance: Casual  Eye Contact::  Fair  Speech:  Normal Rate409  Volume:  Normal  Mood:  Anxious  Affect:  Congruent  Thought Process:  Coherent and Descriptions of Associations: Intact  Orientation:  Full (Time, Place, and Person)  Thought Content:  Logical  Suicidal Thoughts:  No  Homicidal Thoughts:  No  Memory:  Immediate;   Fair Recent;   Fair Remote;   Fair  Judgement:  Intact  Insight:  Fair  Psychomotor Activity:  Increased  Concentration:  Fair  Recall:  [employer]  Fund of Knowledge:Fair  Language: Fair  Akathisia:  Negative  Handed:  Right  AIMS (if indicated):     Assets:  Communication Skills Desire for Improvement Financial Resources/Insurance Housing Physical Health Resilience Social Support Talents/Skills  Sleep:  Number of Hours: 3.5  Cognition: WNL  ADL's:  Intact   Mental Status Per Nursing Assessment::   On Admission:  NA  Demographic Factors:  Divorced or widowed, Caucasian and Unemployed  Loss Factors: NA  Historical Factors: Impulsivity  Risk Reduction Factors:   Responsible for children under 45 years of age, Sense of responsibility to family,  Living with another person, especially a relative and Positive social support  Continued Clinical Symptoms:  Depression:   Comorbid alcohol abuse/dependence Impulsivity Alcohol/Substance Abuse/Dependencies  Cognitive Features That Contribute To Risk:  None    Suicide Risk:  Minimal: No identifiable suicidal ideation.  Patients presenting with no risk factors but with morbid ruminations; may be classified as minimal risk based on the severity of the depressive symptoms  Follow-up Information    [facility] Go on 06/11/2018.   Why:  Please attend your medication appt with Yvette Rackegina Mozingo on tuesday, 06/11/18, at 2:30pm.  Please attend your therapy appt with Priscille LovelessLatisha Carter on 06/18/18, at 10am.  Please contact Lasisha upon discharge by phone to complete initial paperwork. Contact information: [address] [place] [address] [phone]           Plan Of Care/Follow-up recommendations:  Activity:  ad lib  Antonieta PertGreg Lawson Jackilyn Umphlett, MD 06/08/2018, 9:32 AM

## 2018-06-08 NOTE — Progress Notes (Signed)
  [facility] :  Will you be returning to the same living situation after discharge:  No.  Will stay with parents At discharge, do you have transportation home?: Yes,  family Do you have the ability to pay for your medications: Yes,  denies barriers  Release of information consent forms completed and turned in to Medical Records by CSW.   Patient to Follow up at: Follow-up Information    [facility] [employer] [facility]. Go on 06/11/2018.   Why:  Please attend your medication appt with Yvette Rackegina Mozingo on tuesday, 06/11/18, at 2:30pm.  Please attend your therapy appt with Priscille LovelessLatisha Carter on 06/18/18, at 10am.  Please contact Lasisha upon discharge by phone to complete initial paperwork. Contact information: [address] [place] [address] [phone]           Next level of care provider has [facility]:no  Safety Planning and Suicide Prevention discussed: Yes,  with mother  Have you used any form of tobacco in the last 30 days? (Cigarettes, Smokeless Tobacco, Cigars, and/or Pipes): No  Has patient been referred to the Quitline?: N/A patient is not a smoker  Patient has been referred for addiction treatment: Yes  Margaret ChadMareida J Grossman-Orr, LCSW 06/08/2018, 10:02 AM
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
## 2018-06-08 NOTE — Progress Notes (Signed)
Adult Psychoeducational Group Note  Date:  06/08/2018 Time:  1:46 PM  Group Topic/Focus:  Goals Group:   The focus of this group is to help patients establish daily goals to achieve during treatment and discuss how the patient can incorporate goal setting into their daily lives to aide in recovery.  Participation Level:  Active  Participation Quality:  Appropriate  Affect:  Appropriate  Cognitive:  Alert  Insight: Good  Engagement in Group:  Engaged  Modes of Intervention:  Discussion  Additional Comments:  Pt attended group and participated in discussion.  Gayle Collard R Espiridion Supinski 06/08/2018, 1:46 PM

## 2018-06-08 NOTE — Progress Notes (Signed)
Adult Psychoeducational Group Note  Date:  06/08/2018 Time:  2:01 AM  Group Topic/Focus:  Wrap-Up Group:   The focus of this group is to help patients review their daily goal of treatment and discuss progress on daily workbooks.  Participation Level:  Minimal  Participation Quality:  Appropriate  Affect:  Appropriate  Cognitive:  Appropriate and Lacking  Insight: Appropriate, Lacking and Limited  Engagement in Group:  Lacking, Limited and Resistant  Modes of Intervention:  Discussion  Additional Comments:  Pt stated her goal for today was just to make it through. Pt stated she completed her goal. Pt rated her day a 7 out of 10. Pt stated the visitation by her family help improve her day.  Margaret FurnaceChristopher  [name] 06/08/2018, 2:01 AM

## 2018-06-08 NOTE — Plan of Care (Signed)
Discharge Note  Patient verbalizes readiness for discharge. Follow up plan explained, AVS, Transition record and SRA given. Prescriptions and teaching provided. Belongings returned and signed for. Suicide safety plan completed and signed. Patient verbalizes understanding. Patient denies SI/HI and assures this Clinical research associatewriter he will seek assistance should that change. Patient discharged to lobby where father was waiting.  Problem: Education: Goal: Knowledge of [place] General Education information/materials will improve Outcome: Adequate for Discharge Goal: Emotional status will improve Outcome: Adequate for Discharge Goal: Mental status will improve Outcome: Adequate for Discharge Goal: Verbalization of understanding the information provided will improve Outcome: Adequate for Discharge   Problem: Activity: Goal: Interest or engagement in activities will improve Outcome: Adequate for Discharge Goal: Sleeping patterns will improve Outcome: Adequate for Discharge   Problem: Coping: Goal: Ability to verbalize frustrations and anger appropriately will improve Outcome: Adequate for Discharge Goal: Ability to demonstrate self-control will improve Outcome: Adequate for Discharge   Problem: Health Behavior/Discharge Planning: Goal: Identification of resources available to assist in meeting health care needs will improve Outcome: Adequate for Discharge Goal: Compliance with treatment plan for underlying cause of condition will improve Outcome: Adequate for Discharge   Problem: Physical Regulation: Goal: Ability to maintain clinical measurements within normal limits will improve Outcome: Adequate for Discharge   Problem: Safety: Goal: Periods of time without injury will increase Outcome: Adequate for Discharge   Problem: Education: Goal: Knowledge of [place] General Education information/materials will improve Outcome: Adequate for Discharge Goal: Emotional status will  improve Outcome: Adequate for Discharge Goal: Mental status will improve Outcome: Adequate for Discharge Goal: Verbalization of understanding the information provided will improve Outcome: Adequate for Discharge   Problem: Activity: Goal: Interest or engagement in activities will improve Outcome: Adequate for Discharge Goal: Sleeping patterns will improve Outcome: Adequate for Discharge   Problem: Coping: Goal: Ability to verbalize frustrations and anger appropriately will improve Outcome: Adequate for Discharge Goal: Ability to demonstrate self-control will improve Outcome: Adequate for Discharge   Problem: Health Behavior/Discharge Planning: Goal: Identification of resources available to assist in meeting health care needs will improve Outcome: Adequate for Discharge Goal: Compliance with treatment plan for underlying cause of condition will improve Outcome: Adequate for Discharge   Problem: Physical Regulation: Goal: Ability to maintain clinical measurements within normal limits will improve Outcome: Adequate for Discharge   Problem: Safety: Goal: Periods of time without injury will increase Outcome: Adequate for Discharge   Problem: Education: Goal: Utilization of techniques to improve thought processes will improve Outcome: Adequate for Discharge Goal: Knowledge of the prescribed therapeutic regimen will improve Outcome: Adequate for Discharge   Problem: Activity: Goal: Interest or engagement in leisure activities will improve Outcome: Adequate for Discharge Goal: Imbalance in normal sleep/wake cycle will improve Outcome: Adequate for Discharge   Problem: Coping: Goal: Coping ability will improve Outcome: Adequate for Discharge Goal: Will verbalize feelings Outcome: Adequate for Discharge   Problem: Health Behavior/Discharge Planning: Goal: Ability to make decisions will improve Outcome: Adequate for Discharge Goal: Compliance with therapeutic regimen will  improve Outcome: Adequate for Discharge   Problem: Role Relationship: Goal: Will demonstrate positive changes in social behaviors and relationships Outcome: Adequate for Discharge   Problem: Safety: Goal: Ability to disclose and discuss suicidal ideas will improve Outcome: Adequate for Discharge Goal: Ability to identify and utilize support systems that promote safety will improve Outcome: Adequate for Discharge   Problem: Self-Concept: Goal: Will verbalize positive feelings about self Outcome: Adequate for Discharge Goal: Level of anxiety will  decrease Outcome: Adequate for Discharge   Problem: Education: Goal: Ability to make informed decisions regarding treatment will improve Outcome: Adequate for Discharge   Problem: Coping: Goal: Coping ability will improve Outcome: Adequate for Discharge   Problem: Health Behavior/Discharge Planning: Goal: Identification of resources available to assist in meeting health care needs will improve Outcome: Adequate for Discharge   Problem: Medication: Goal: Compliance with prescribed medication regimen will improve Outcome: Adequate for Discharge   Problem: Self-Concept: Goal: Ability to disclose and discuss suicidal ideas will improve Outcome: Adequate for Discharge Goal: Will verbalize positive feelings about self Outcome: Adequate for Discharge   Problem: Education: Goal: Knowledge of General Education information will improve Description Including pain rating scale, medication(s)/side effects and non-pharmacologic comfort measures Outcome: Adequate for Discharge   Problem: Health Behavior/Discharge Planning: Goal: Ability to manage health-related needs will improve Outcome: Adequate for Discharge   Problem: Coping: Goal: Level of anxiety will decrease Outcome: Adequate for Discharge   Problem: Safety: Goal: Ability to remain free from injury will improve Outcome: Adequate for Discharge

## 2018-06-11 ENCOUNTER — Encounter (HOSPITAL_COMMUNITY): Payer: Self-pay | Admitting: Emergency Medicine

## 2019-07-08 ENCOUNTER — Encounter (HOSPITAL_COMMUNITY): Payer: Self-pay | Admitting: Emergency Medicine

## 2019-07-08 ENCOUNTER — Emergency Department (HOSPITAL_COMMUNITY): Payer: Self-pay

## 2019-07-08 ENCOUNTER — Emergency Department (HOSPITAL_COMMUNITY)
Admission: EM | Admit: 2019-07-08 | Discharge: 2019-07-08 | Disposition: A | Discharge status: Home / Self Care | Payer: Self-pay | Attending: Emergency Medicine | Admitting: Emergency Medicine

## 2019-07-08 ENCOUNTER — Other Ambulatory Visit: Payer: Self-pay

## 2019-07-08 DIAGNOSIS — Z79899 Other long term (current) drug therapy: Secondary | ICD-10-CM | POA: Insufficient documentation

## 2019-07-08 DIAGNOSIS — S060X1A Concussion with loss of consciousness of 30 minutes or less, initial encounter: Secondary | ICD-10-CM | POA: Insufficient documentation

## 2019-07-08 DIAGNOSIS — S0990XA Unspecified injury of head, initial encounter: Secondary | ICD-10-CM

## 2019-07-08 DIAGNOSIS — Y999 Unspecified external cause status: Secondary | ICD-10-CM | POA: Insufficient documentation

## 2019-07-08 DIAGNOSIS — Y939 Activity, unspecified: Secondary | ICD-10-CM | POA: Insufficient documentation

## 2019-07-08 DIAGNOSIS — S0181XA Laceration without foreign body of other part of head, initial encounter: Secondary | ICD-10-CM | POA: Insufficient documentation

## 2019-07-08 DIAGNOSIS — F17210 Nicotine dependence, cigarettes, uncomplicated: Secondary | ICD-10-CM | POA: Insufficient documentation

## 2019-07-08 DIAGNOSIS — Z23 Encounter for immunization: Secondary | ICD-10-CM | POA: Insufficient documentation

## 2019-07-08 DIAGNOSIS — Y929 Unspecified place or not applicable: Secondary | ICD-10-CM | POA: Insufficient documentation

## 2019-07-08 LAB — COMPREHENSIVE METABOLIC PANEL
ALT: 144 U/L — ABNORMAL HIGH (ref 0–44)
AST: 90 U/L — ABNORMAL HIGH (ref 15–41)
Albumin: 4.2 g/dL (ref 3.5–5.0)
Alkaline Phosphatase: 131 U/L — ABNORMAL HIGH (ref 38–126)
Anion gap: 19 — ABNORMAL HIGH (ref 5–15)
BUN: 8 mg/dL (ref 6–20)
CO2: 18 mmol/L — ABNORMAL LOW (ref 22–32)
Calcium: 9.4 mg/dL (ref 8.9–10.3)
Chloride: 104 mmol/L (ref 98–111)
Creatinine, Ser: 1.02 mg/dL — ABNORMAL HIGH (ref 0.44–1.00)
GFR calc Af Amer: >60 mL/min (ref >60)
GFR calc non Af Amer: >60 mL/min (ref >60)
Glucose, Bld: 104 mg/dL — ABNORMAL HIGH (ref 70–99)
Potassium: 4 mmol/L (ref 3.5–5.1)
Sodium: 141 mmol/L (ref 135–145)
Total Bilirubin: 0.5 mg/dL (ref 0.3–1.2)
Total Protein: 7.4 g/dL (ref 6.5–8.1)

## 2019-07-08 LAB — CBC WITH DIFFERENTIAL/PLATELET
Abs Immature Granulocytes: 0.06 10*3/uL (ref 0.00–0.07)
Basophils Absolute: 0 10*3/uL (ref 0.0–0.1)
Basophils Relative: 0 %
Eosinophils Absolute: 0 10*3/uL (ref 0.0–0.5)
Eosinophils Relative: 0 %
HCT: 43.8 % (ref 36.0–46.0)
Hemoglobin: 15.3 g/dL — ABNORMAL HIGH (ref 12.0–15.0)
Immature Granulocytes: 1 %
Lymphocytes Relative: 20 %
Lymphs Abs: 2.6 10*3/uL (ref 0.7–4.0)
MCH: 31.9 pg (ref 26.0–34.0)
MCHC: 34.9 g/dL (ref 30.0–36.0)
MCV: 91.4 fL (ref 80.0–100.0)
Monocytes Absolute: 0.5 10*3/uL (ref 0.1–1.0)
Monocytes Relative: 4 %
Neutro Abs: 9.8 10*3/uL — ABNORMAL HIGH (ref 1.7–7.7)
Neutrophils Relative %: 75 %
Platelets: UNDETERMINED 10*3/uL (ref 150–400)
RBC: 4.79 MIL/uL (ref 3.87–5.11)
RDW: 13.9 % (ref 11.5–15.5)
WBC: 13 10*3/uL — ABNORMAL HIGH (ref 4.0–10.5)
nRBC: 0 % (ref 0.0–0.2)

## 2019-07-08 LAB — I-STAT BETA HCG BLOOD, ED (MC, WL, AP ONLY): I-stat hCG, quantitative: <5 m[IU]/mL (ref <5)

## 2019-07-08 LAB — ETHANOL: Alcohol, Ethyl (B): 74 mg/dL — ABNORMAL HIGH (ref <10)

## 2019-07-08 MED ORDER — MORPHINE SULFATE (PF) 4 MG/ML IV SOLN
4.0000 mg | Freq: Once | INTRAVENOUS | Status: AC
  Administered 2019-07-08: 4 mg via INTRAVENOUS
  Filled 2019-07-08: qty 1

## 2019-07-08 MED ORDER — ONDANSETRON HCL 4 MG/2ML IJ SOLN
4.0000 mg | Freq: Once | INTRAMUSCULAR | Status: AC
  Administered 2019-07-08: 4 mg via INTRAVENOUS
  Filled 2019-07-08: qty 2

## 2019-07-08 MED ORDER — THIAMINE HCL 100 MG/ML IJ SOLN
100.0000 mg | Freq: Once | INTRAMUSCULAR | Status: AC
  Administered 2019-07-08: 100 mg via INTRAVENOUS
  Filled 2019-07-08: qty 2

## 2019-07-08 MED ORDER — ACETAMINOPHEN 500 MG PO TABS
1000.0000 mg | ORAL_TABLET | Freq: Once | ORAL | Status: AC
  Administered 2019-07-08: 1000 mg via ORAL
  Filled 2019-07-08: qty 2

## 2019-07-08 MED ORDER — ONDANSETRON HCL 4 MG/2ML IJ SOLN
2.0000 mg | Freq: Once | INTRAMUSCULAR | Status: AC
  Administered 2019-07-08: 2 mg via INTRAVENOUS
  Filled 2019-07-08: qty 2

## 2019-07-08 MED ORDER — TETANUS-DIPHTH-ACELL PERTUSSIS 5-2.5-18.5 LF-MCG/0.5 IM SUSP
0.5000 mL | Freq: Once | INTRAMUSCULAR | Status: AC
Start: 2019-07-08 — End: 2019-07-08
  Administered 2019-07-08: 0.5 mL via INTRAMUSCULAR
  Filled 2019-07-08: qty 0.5

## 2019-07-08 MED ORDER — MORPHINE SULFATE (PF) 2 MG/ML IV SOLN
2.0000 mg | Freq: Once | INTRAVENOUS | Status: AC
  Administered 2019-07-08: 2 mg via INTRAVENOUS
  Filled 2019-07-08: qty 1

## 2019-07-08 MED ORDER — LIDOCAINE-EPINEPHRINE-TETRACAINE (LET) SOLUTION
3.0000 mL | Freq: Once | NASAL | Status: AC
  Administered 2019-07-08: 3 mL via TOPICAL
  Filled 2019-07-08: qty 3

## 2019-07-08 MED ORDER — LIDOCAINE-EPINEPHRINE (PF) 2 %-1:200000 IJ SOLN
10.0000 mL | Freq: Once | INTRAMUSCULAR | Status: AC
  Administered 2019-07-08: 10 mL
  Filled 2019-07-08: qty 20

## 2019-07-08 MED ORDER — SODIUM CHLORIDE 0.9 % IV BOLUS
500.0000 mL | Freq: Once | INTRAVENOUS | Status: AC
  Administered 2019-07-08: 500 mL via INTRAVENOUS

## 2019-07-08 MED ORDER — BUPIVACAINE HCL (PF) 0.5 % IJ SOLN
10.0000 mL | Freq: Once | INTRAMUSCULAR | Status: AC
  Administered 2019-07-08: 10 mL
  Filled 2019-07-08: qty 10

## 2019-07-08 MED ORDER — FOLIC ACID 1 MG PO TABS
1.0000 mg | ORAL_TABLET | Freq: Once | ORAL | Status: DC
  Filled 2019-07-08: qty 1

## 2019-07-08 MED ORDER — LORAZEPAM 2 MG/ML IJ SOLN
0.5000 mg | Freq: Once | INTRAMUSCULAR | Status: AC
  Administered 2019-07-08: 0.5 mg via INTRAVENOUS
  Filled 2019-07-08: qty 1

## 2019-07-08 NOTE — ED Notes (Signed)
Pt ambulated to the restroom.

## 2019-07-08 NOTE — ED Notes (Signed)
Pt notified that we need a urine sample but unable to go at this time. 
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
## 2019-07-08 NOTE — ED Notes (Signed)
Patient verbalizes understanding of discharge instructions. Opportunity for questioning and answers were provided. Armband removed by staff, pt discharged from ED.  
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
## 2019-07-08 NOTE — ED Notes (Signed)
Pt able to ambulate with assistance.  

## 2019-07-08 NOTE — ED Notes (Signed)
Patient transported to X-ray 
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
## 2019-07-08 NOTE — ED Notes (Addendum)
Patient nauseated and vomiting, despite zofran at 0628. Pt provided emotional support, cool rag to head. PA Athena Masse made aware

## 2019-07-08 NOTE — ED Notes (Signed)
Suture Cart at bedside.  
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
## 2019-11-17 ENCOUNTER — Encounter (HOSPITAL_COMMUNITY): Payer: Self-pay | Admitting: Emergency Medicine

## 2019-11-17 ENCOUNTER — Other Ambulatory Visit: Payer: Self-pay

## 2019-11-17 ENCOUNTER — Emergency Department (HOSPITAL_COMMUNITY): Payer: BLUE CROSS/BLUE SHIELD

## 2019-11-17 ENCOUNTER — Emergency Department (HOSPITAL_COMMUNITY)
Admission: EM | Admit: 2019-11-17 | Discharge: 2019-11-17 | Disposition: A | Discharge status: Home / Self Care | Payer: BLUE CROSS/BLUE SHIELD | Attending: Emergency Medicine | Admitting: Emergency Medicine

## 2019-11-17 DIAGNOSIS — Z87891 Personal history of nicotine dependence: Secondary | ICD-10-CM | POA: Insufficient documentation

## 2019-11-17 DIAGNOSIS — R509 Fever, unspecified: Secondary | ICD-10-CM | POA: Insufficient documentation

## 2019-11-17 DIAGNOSIS — R112 Nausea with vomiting, unspecified: Secondary | ICD-10-CM | POA: Insufficient documentation

## 2019-11-17 DIAGNOSIS — Z79899 Other long term (current) drug therapy: Secondary | ICD-10-CM | POA: Insufficient documentation

## 2019-11-17 DIAGNOSIS — R0602 Shortness of breath: Secondary | ICD-10-CM | POA: Insufficient documentation

## 2019-11-17 DIAGNOSIS — Z20822 Contact with and (suspected) exposure to covid-19: Secondary | ICD-10-CM | POA: Insufficient documentation

## 2019-11-17 DIAGNOSIS — R197 Diarrhea, unspecified: Secondary | ICD-10-CM | POA: Insufficient documentation

## 2019-11-17 DIAGNOSIS — R05 Cough: Secondary | ICD-10-CM | POA: Insufficient documentation

## 2019-11-17 LAB — CBC WITH DIFFERENTIAL/PLATELET
Abs Immature Granulocytes: 0.06 10*3/uL (ref 0.00–0.07)
Basophils Absolute: 0 10*3/uL (ref 0.0–0.1)
Basophils Relative: 0 %
Eosinophils Absolute: 0 10*3/uL (ref 0.0–0.5)
Eosinophils Relative: 0 %
HCT: 40 % (ref 36.0–46.0)
Hemoglobin: 13.1 g/dL (ref 12.0–15.0)
Immature Granulocytes: 0 %
Lymphocytes Relative: 12 %
Lymphs Abs: 1.8 10*3/uL (ref 0.7–4.0)
MCH: 30.3 pg (ref 26.0–34.0)
MCHC: 32.8 g/dL (ref 30.0–36.0)
MCV: 92.4 fL (ref 80.0–100.0)
Monocytes Absolute: 1.2 10*3/uL — ABNORMAL HIGH (ref 0.1–1.0)
Monocytes Relative: 8 %
Neutro Abs: 11.8 10*3/uL — ABNORMAL HIGH (ref 1.7–7.7)
Neutrophils Relative %: 80 %
Platelets: 276 10*3/uL (ref 150–400)
RBC: 4.33 MIL/uL (ref 3.87–5.11)
RDW: 14.6 % (ref 11.5–15.5)
WBC: 14.9 10*3/uL — ABNORMAL HIGH (ref 4.0–10.5)
nRBC: 0 % (ref 0.0–0.2)

## 2019-11-17 LAB — COMPREHENSIVE METABOLIC PANEL
ALT: 59 U/L — ABNORMAL HIGH (ref 0–44)
AST: 59 U/L — ABNORMAL HIGH (ref 15–41)
Albumin: 3.4 g/dL — ABNORMAL LOW (ref 3.5–5.0)
Alkaline Phosphatase: 125 U/L (ref 38–126)
Anion gap: 17 — ABNORMAL HIGH (ref 5–15)
BUN: 11 mg/dL (ref 6–20)
CO2: 21 mmol/L — ABNORMAL LOW (ref 22–32)
Calcium: 9 mg/dL (ref 8.9–10.3)
Chloride: 98 mmol/L (ref 98–111)
Creatinine, Ser: 0.77 mg/dL (ref 0.44–1.00)
GFR calc Af Amer: >60 mL/min (ref >60)
GFR calc non Af Amer: >60 mL/min (ref >60)
Glucose, Bld: 137 mg/dL — ABNORMAL HIGH (ref 70–99)
Potassium: 3.7 mmol/L (ref 3.5–5.1)
Sodium: 136 mmol/L (ref 135–145)
Total Bilirubin: 1.2 mg/dL (ref 0.3–1.2)
Total Protein: 6.7 g/dL (ref 6.5–8.1)

## 2019-11-17 LAB — I-STAT BETA HCG BLOOD, ED (MC, WL, AP ONLY): I-stat hCG, quantitative: 19.7 m[IU]/mL — ABNORMAL HIGH (ref <5)

## 2019-11-17 LAB — SARS CORONAVIRUS 2 (TAT 6-24 HRS): SARS Coronavirus 2: NEGATIVE

## 2019-11-17 MED ORDER — SODIUM CHLORIDE 0.9 % IV BOLUS
1000.0000 mL | Freq: Once | INTRAVENOUS | Status: AC
  Administered 2019-11-17: 1000 mL via INTRAVENOUS

## 2019-11-17 MED ORDER — ONDANSETRON 4 MG PO TBDP: 4.0000 mg | ORAL_TABLET | Freq: Three times a day (TID) | ORAL | 0 refills | Status: DC | PRN

## 2019-11-17 MED ORDER — ONDANSETRON HCL 4 MG/2ML IJ SOLN
4.0000 mg | Freq: Once | INTRAMUSCULAR | Status: AC
  Administered 2019-11-17: 4 mg via INTRAVENOUS
  Filled 2019-11-17: qty 2

## 2019-11-17 MED ORDER — PREDNISONE 10 MG (21) PO TBPK: ORAL_TABLET | ORAL | 0 refills | Status: DC

## 2019-11-17 MED ORDER — GABAPENTIN 600 MG PO TABS
1200.0000 mg | ORAL_TABLET | Freq: Once | ORAL | Status: AC
  Administered 2019-11-17: 1200 mg via ORAL
  Filled 2019-11-17: qty 2

## 2019-11-17 MED ORDER — BENZONATATE 100 MG PO CAPS: 100.0000 mg | ORAL_CAPSULE | Freq: Three times a day (TID) | ORAL | 0 refills | Status: DC

## 2019-11-17 MED ORDER — ALBUTEROL SULFATE HFA 108 (90 BASE) MCG/ACT IN AERS
1.0000 | INHALATION_SPRAY | Freq: Once | RESPIRATORY_TRACT | Status: AC
  Administered 2019-11-17: 2 via RESPIRATORY_TRACT
  Filled 2019-11-17: qty 6.7

## 2019-11-17 NOTE — ED Notes (Signed)
Pt resting now, calmer than on arrival. sats now 97% on RA. Pt states she feels much better
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
## 2019-11-17 NOTE — Discharge Instructions (Addendum)
General Viral Syndrome Care Instructions:  Your symptoms are likely consistent with a viral illness. Viruses do not require or respond to antibiotics. Treatment is symptomatic care.   Hand washing: Wash your hands throughout the day, but especially before and after touching the face, using the restroom, sneezing, coughing, or touching surfaces that have been coughed or sneezed upon. Hydration: Symptoms of most illnesses will be intensified and complicated by dehydration. Dehydration can also extend the duration of symptoms. Drink plenty of fluids and get plenty of rest. You should be drinking at least half a liter of water an hour to stay hydrated. Electrolyte drinks (ex. Gatorade, Powerade, Pedialyte) are also encouraged. You should be drinking enough fluids to make your urine light yellow, almost clear. If this is not the case, you are not drinking enough water. Please note that some of the treatments indicated below will not be effective if you are not adequately hydrated. Diet: Please concentrate on hydration, however, you may introduce food slowly.  Start with a clear liquid diet, progressed to a full liquid diet, and then bland solids as you are able. Pain or fever: Ibuprofen, Naproxen, or acetaminophen (generic for Tylenol) for pain or fever.  Antiinflammatory medications: Take 600 mg of ibuprofen every 6 hours or 440 mg (over the counter dose) to 500 mg (prescription dose) of naproxen every 12 hours for the next 3 days. After this time, these medications may be used as needed for pain. Take these medications with food to avoid upset stomach. Choose only one of these medications, do not take them together. Acetaminophen (generic for Tylenol): Should you continue to have additional pain while taking the ibuprofen or naproxen, you may add in acetaminophen as needed. Your daily total maximum amount of acetaminophen from all sources should be limited to 4000mg /day for persons without liver problems, or  2000mg /day for those with liver problems. Nausea/vomiting: Use the ondansetron (generic for Zofran) for nausea or vomiting.  This medication may not prevent all vomiting or nausea, but can help facilitate better hydration. Things that can help with nausea/vomiting also include peppermint/menthol candies, vitamin B12, and ginger. Diarrhea: May use medications such as loperamide (Imodium) or Bismuth subsalicylate (Pepto-Bismol). Cough: Use the benzonatate (generic for Tessalon) for cough.  Teas, warm liquids, broths, and honey can also help with cough. Albuterol: May use the albuterol as needed for instances of shortness of breath. Prednisone: Take the prednisone, as directed, in its entirety. Zyrtec or Claritin: May add these medication daily to control underlying symptoms of congestion, sneezing, and other signs of allergies.  These medications are available over-the-counter. Generics: Cetirizine (generic for Zyrtec) and loratadine (generic for Claritin). Fluticasone: Use fluticasone (generic for Flonase), as directed, for nasal and sinus congestion.  This medication is available over-the-counter. Congestion: Plain guaifenesin (generic for plain Mucinex) may help relieve congestion. Saline sinus rinses and saline nasal sprays may also help relieve congestion. If you do not have high blood pressure, heart problems, or an allergy to such medications, you may also try phenylephrine or Sudafed. Sore throat: Warm liquids or Chloraseptic spray may help soothe a sore throat. Gargle twice a day with a salt water solution made from a half teaspoon of salt in a cup of warm water.  Follow up: Follow up with a primary care provider within the next two weeks should symptoms fail to resolve. Return: Return to the ED for significantly worsening symptoms, shortness of breath, persistent vomiting, large amounts of blood in stool, or any other major concerns.  For prescription assistance, may try using prescription  discount sites or apps, such as goodrx.com  Test Results for COVID-19 pending  You have a test pending for COVID-19.  Results typically return within about 48 hours.  Be sure to check MyChart for updated results.  We recommend isolating yourself until results are received.  Patients who have symptoms consistent with COVID-19 should self isolated for: At least 3 days (72 hours) have passed since recovery, defined as resolution of fever without the use of fever reducing medications and improvement in respiratory symptoms (e.g., cough, shortness of breath), and At least 7 days have passed since symptoms first appeared.  If you have no symptoms, but your test returns positive, recommend isolating for at least 10 days.

## 2019-11-17 NOTE — ED Notes (Signed)
Pt states LMP 3 wks ago, and she has not had any intercourse lately. Refuses urine pregnancy test at this time

## 2019-11-17 NOTE — ED Provider Notes (Addendum)
[facility] EMERGENCY DEPARTMENT Provider Note   CSN: [id number] Arrival date & time: 11/17/19  5427     History Chief Complaint  Patient presents with  . Shortness of Breath    Margaret Mccann is a 49 y.o. female.  HPI      Margaret Mccann is a 49 y.o. female, with a history of depression, bipolar, anxiety, presenting to the ED with shortness of breath beginning yesterday. She notes symptoms of cough, shortness of breath, fever, nausea, vomiting, and diarrhea for the past 2 weeks.  Fever resolved about 4 days ago.  Shortness of breath worsened yesterday. EMS reports patient was quite anxious upon their arrival.  She received albuterol and Atrovent treatment, which she said helped a little bit.  She received Versed, which she states helped a lot.  She also received 2 g magnesium.  Upon her arrival in the ED, she no longer has any complaints other than "feeling dehydrated." Denies history of PE/DVT, recent immobilization, recent surgery, recent trauma.  Denies hematemesis, hematochezia/melena, abdominal pain, chest pain, syncope, lower extremity edema/pain, or any other complaints.  Past Medical History:  Diagnosis Date  . Anxiety   . Bipolar 1 disorder (HCC)   . Depression   . Morgellons syndrome     Patient Active Problem List   Diagnosis Date Noted  . MDD (major depressive disorder), recurrent episode, severe (HCC) 06/04/2018  . Schizophrenia (HCC) 06/04/2018  . Suicide attempt (HCC)   . Seizure (HCC) 06/02/2018  . Chest pain 07/13/2015  . Palpitations 07/13/2015  . Cocaine abuse (HCC) 06/03/2012  . 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 APPENDECTOMY       OB History   No obstetric history on file.     Family History  Problem Relation Age of Onset  . Heart attack Father 16  .  Stroke Father   . Breast cancer Paternal Aunt   . Ulcerative colitis Neg Hx   . Esophageal cancer Neg Hx     Social History   Tobacco Use  . Smoking status: Former Smoker    Packs/day: 0.50    Types: Cigarettes  . Smokeless tobacco: Never Used  Substance Use Topics  . Alcohol use: Yes    Comment: occas  . Drug use: Not Currently    Comment: not currently-was +cocaine and amphetamines, clean x3 years    Home Medications Prior to Admission medications   Medication Sig Start Date End Date Taking? Authorizing Provider  benzonatate (TESSALON) 100 MG capsule Take 1 capsule (100 mg total) by mouth every 8 (eight) hours. 11/17/19   Ardis Lawley C, PA-C  buPROPion (WELLBUTRIN XL) 150 MG 24 hr tablet Take 450 mg by mouth daily.     [provider]  cloNIDine (CATAPRES) 0.2 MG tablet Take 0.2 mg by mouth at bedtime.    [provider]  FLUoxetine (PROZAC) 20 MG capsule Take 1 capsule (20 mg total) by mouth daily. 06/09/18   Charm Rings, NP  FLUoxetine (PROZAC) 40 MG capsule Take 40 mg by mouth daily.    [provider]  gabapentin (NEURONTIN) 600 MG tablet Take 2 tablets (1,200 mg total) by mouth 2 (two) times daily. 04/02/18   Eber Hong, MD  hydrOXYzine (ATARAX/VISTARIL) 25 MG tablet Take 1 tablet (25 mg total) by mouth 3 (three) times daily as needed for anxiety. 06/08/18  Charm Rings, NP  hydrOXYzine (VISTARIL) 50 MG capsule Take 50 mg by mouth at bedtime. 03/24/18   [provider]  ibuprofen (ADVIL,MOTRIN) 200 MG tablet Take 200-800 mg by mouth every 6 (six) hours as needed for moderate pain.    [provider]  mirtazapine (REMERON) 15 MG tablet Take 1 tablet (15 mg total) by mouth at bedtime. 06/08/18   Charm Rings, NP  mirtazapine (REMERON) 30 MG tablet Take 1 tablet (30 mg total) by mouth at bedtime. 04/02/18   Eber Hong, MD  naproxen sodium (ALEVE) 220 MG tablet Take 220 mg by mouth 2 (two) times daily as needed (pain).     [provider]  Norethin Ace-Eth Estrad-FE (MINASTRIN 24 FE PO) Take 1 tablet by mouth daily.    [provider]  Norethindrone Acetate-Ethinyl Estrad-FE (BLISOVI 24 FE) 1-20 MG-MCG(24) tablet Take 1 tablet by mouth daily.    [provider]  ondansetron (ZOFRAN ODT) 4 MG disintegrating tablet Take 1 tablet (4 mg total) by mouth every 8 (eight) hours as needed for nausea or vomiting. 11/17/19   Tyne Banta C, PA-C  oxyCODONE-acetaminophen (PERCOCET) 5-325 MG tablet Take 1-2 tablets by mouth every 6 (six) hours as needed. 04/02/18   Eber Hong, MD  pantoprazole (PROTONIX) 40 MG tablet Take 1 tablet (40 mg total) by mouth 2 (two) times daily. Patient not taking: Reported on 04/02/2018 06/20/17   Esterwood, Amy S, PA-C  pantoprazole (PROTONIX) 40 MG tablet Take 1 tablet (40 mg total) by mouth daily. 06/09/18   Charm Rings, NP  predniSONE (STERAPRED UNI-PAK 21 TAB) 10 MG (21) TBPK tablet Take 6 tabs (60mg ) day 1, 5 tabs (50mg ) day 2, 4 tabs (40mg ) day 3, 3 tabs (30mg ) day 4, 2 tabs (20mg ) day 5, and 1 tab (10mg ) day 6. 11/17/19   Jayden Rudge C, PA-C  promethazine (PHENERGAN) 25 MG tablet Take 1 tablet (25 mg total) by mouth every 6 (six) hours as needed for nausea or vomiting. Patient not taking: Reported on 04/02/2018 05/29/17   , MD  QUEtiapine (SEROQUEL) 100 MG tablet Take 1 tablet (100 mg total) by mouth at bedtime. 06/08/18   , NP  traMADol (ULTRAM) 50 MG tablet Take 1 tablet (50 mg total) by mouth every 6 (six) hours as needed. Patient not taking: Reported on 04/02/2018 05/25/17   Esterwood, Amy S, PA-C  traZODone (DESYREL) 100 MG tablet Take 1 tablet (100 mg total) by mouth at bedtime. For sleep. 06/07/12 04/02/18  Readling, 06/10/18, MD    Allergies    Lamictal [lamotrigine], Erythromycin, Lamictal [lamotrigine], and Tramadol  Review of Systems   Review of Systems  Constitutional: Positive for fever (resolved).  Respiratory: Positive for cough and  shortness of breath.   Cardiovascular: Negative for chest pain and leg swelling.  Gastrointestinal: Positive for diarrhea, nausea and vomiting. Negative for abdominal pain and blood in stool.  Genitourinary: Negative for dysuria, flank pain, frequency and hematuria.  Neurological: Negative for dizziness, syncope, weakness and numbness.  All other systems reviewed and are negative.   Physical Exam Updated Vital Signs BP 103/67   Pulse 86   Temp 98.8 F (37.1 C) (Oral)   Resp (!) 22   Wt 68 kg   LMP 10/27/2019   SpO2 97%   BMI 26.56 kg/m   Physical Exam Vitals and nursing note reviewed.  Constitutional:      General: She is not in acute distress.  Appearance: She is well-developed. She is not diaphoretic.  HENT:     Head: Normocephalic and atraumatic.     Mouth/Throat:     Mouth: Mucous membranes are moist.     Pharynx: Oropharynx is clear.  Eyes:     Conjunctiva/sclera: Conjunctivae normal.  Cardiovascular:     Rate and Rhythm: Normal rate and regular rhythm.     Pulses: Normal pulses.          Radial pulses are 2+ on the right side and 2+ on the left side.       Posterior tibial pulses are 2+ on the right side and 2+ on the left side.     Heart sounds: Normal heart sounds.     Comments: Tactile temperature in the extremities appropriate and equal bilaterally. Pulmonary:     Effort: Pulmonary effort is normal. No respiratory distress.     Breath sounds: Normal breath sounds.     Comments: Initially, patient with slight tachypnea.  Was able to use verbal techniques to calm the patient, which resolved her tachypnea. Noted to be on 2 L supplemental O2 with SPO2 99%. Room air SPO2 93 to 97%. Abdominal:     Palpations: Abdomen is soft.     Tenderness: There is no abdominal tenderness. There is no guarding.  Musculoskeletal:     Cervical back: Neck supple.     Right lower leg: No edema.     Left lower leg: No edema.     Comments: No lower extremity tenderness,  swelling, color change, or increased warmth.  Lymphadenopathy:     Cervical: No cervical adenopathy.  Skin:    General: Skin is warm and dry.  Neurological:     Mental Status: She is alert.  Psychiatric:        Mood and Affect: Affect normal. Mood is anxious.        Speech: Speech normal.        Behavior: Behavior normal.     ED Results / Procedures / Treatments   Labs (all labs ordered are listed, but only abnormal results are displayed) Labs Reviewed  CBC WITH DIFFERENTIAL/PLATELET - Abnormal; Notable for the following components:      Result Value   WBC 14.9 (*)    Neutro Abs 11.8 (*)    Monocytes Absolute 1.2 (*)    All other components within normal limits  COMPREHENSIVE METABOLIC PANEL - Abnormal; Notable for the following components:   CO2 21 (*)    Glucose, Bld 137 (*)    Albumin 3.4 (*)    AST 59 (*)    ALT 59 (*)    Anion gap 17 (*)    All other components within normal limits  I-STAT BETA HCG BLOOD, ED ([facility], [facility], [facility] ONLY) - Abnormal; Notable for the following components:   I-stat hCG, quantitative 19.7 (*)    All other components within normal limits  SARS CORONAVIRUS 2 (TAT 6-24 HRS)    EKG EKG Interpretation  Date/Time:  Monday November 17 2019 06:08:03 EST Ventricular Rate:  98 PR Interval:    QRS Duration: 85 QT Interval:  379 QTC Calculation: 484 R Axis:   -24 Text Interpretation: Sinus rhythm Probable left atrial enlargement Borderline left axis deviation Low voltage, precordial leads Consider anterior infarct No significant change since last tracing Confirmed by Marily Memos [phone]) on 11/17/2019 6:32:37 AM   Radiology DG Chest Portable 1 View  Result Date: 11/17/2019 CLINICAL DATA:  Shortness of breath for 2 weeks  EXAM: PORTABLE CHEST 1 VIEW COMPARISON:  Is 07/08/2019 FINDINGS: Normal heart size. Lower mediastinal widening from hiatal hernia by 2019 CT. No acute infiltrate or edema. No effusion or pneumothorax. No acute osseous findings.  IMPRESSION: No active disease. Electronically Signed   By: Monte Fantasia M.D.   On: 11/17/2019 06:49    Procedures Procedures (including critical care time)  Medications Ordered in ED Medications  gabapentin (NEURONTIN) tablet 1,200 mg (1,200 mg Oral Given 11/17/19 0725)  sodium chloride 0.9 % bolus 1,000 mL (0 mLs Intravenous Stopped 11/17/19 1009)  ondansetron (ZOFRAN) injection 4 mg (4 mg Intravenous Given 11/17/19 0731)  albuterol (VENTOLIN HFA) 108 (90 Base) MCG/ACT inhaler 1-2 puff (2 puffs Inhalation Given 11/17/19 0750)    ED Course  I have reviewed the triage vital signs and the nursing notes.  Pertinent labs & imaging results that were available during my care of the patient were reviewed by me and considered in my medical decision making (see chart for details).  Clinical Course as of Nov 17 1027  Mon Nov 17, 2019  1008 Patient denies any sexual activity.  She prefers to be discharged rather than wait on more testing, such as urine pregnancy.  I-stat hCG, quantitative(!): 19.7 [SJ]    Clinical Course User Index [SJ] Elleni Mozingo, Helane Gunther, PA-C   MDM Rules/Calculators/A&P                      Patient presents with shortness of breath that worsened yesterday. Patient is nontoxic appearing, afebrile, not tachycardic, not hypotensive, maintains adequate SPO2 on room air, and is in no apparent distress.  She did have some intermittent tachypnea, but seem to be associated with anxious mood.  This was not sustained, despite reported vital signs.  No acute abnormalities on chest x-ray.  I have reviewed the patient's chart to obtain more information.   I reviewed and interpreted the patient's labs and radiological studies. PE was considered, but my suspicion is low based on the lack of above vital sign abnormalities, lack of risk factors, and resolution in patient's shortness of breath. Mild leukocytosis nonspecific currently.  Suspect some dehydration with mildly elevated anion gap and  mildly decreased CO2.  Addressed with IV fluids. Very mild AST and ALT elevation. Recommend PCP follow-up. The patient was given instructions for home care as well as return precautions. Patient voices understanding of these instructions, accepts the plan, and is comfortable with discharge.    Margaret Mccann was evaluated in Emergency Department on 11/17/2019 for the symptoms described in the history of present illness. She was evaluated in the context of the global COVID-19 pandemic, which necessitated consideration that the patient might be at risk for infection with the SARS-CoV-2 virus that causes COVID-19. Institutional protocols and algorithms that pertain to the evaluation of patients at risk for COVID-19 are in a state of rapid change based on information released by regulatory bodies including the CDC and federal and state organizations. These policies and algorithms were followed during the patient's care in the ED.  Final Clinical Impression(s) / ED Diagnoses Final diagnoses:  Shortness of breath    Rx / DC Orders ED Discharge Orders         Ordered    predniSONE (STERAPRED UNI-PAK 21 TAB) 10 MG (21) TBPK tablet     11/17/19 1006    ondansetron (ZOFRAN ODT) 4 MG disintegrating tablet  Every 8 hours PRN     11/17/19 1006    benzonatate (  TESSALON) 100 MG capsule  Every 8 hours     11/17/19 1006           Margaret Pancoast, PA-C 11/17/19 1034    Margaret Pancoast, PA-C 11/17/19 1040    Melene Plan, DO 11/17/19 1440

## 2019-11-17 NOTE — ED Notes (Signed)
Pt tolerating PO fluids

## 2020-01-29 ENCOUNTER — Emergency Department (HOSPITAL_COMMUNITY): Payer: Self-pay

## 2020-01-29 ENCOUNTER — Encounter (HOSPITAL_COMMUNITY): Payer: Self-pay

## 2020-01-29 ENCOUNTER — Other Ambulatory Visit: Payer: Self-pay

## 2020-01-29 ENCOUNTER — Emergency Department (HOSPITAL_COMMUNITY)
Admission: EM | Admit: 2020-01-29 | Discharge: 2020-01-29 | Disposition: A | Discharge status: Home / Self Care | Payer: Self-pay | Attending: Emergency Medicine | Admitting: Emergency Medicine

## 2020-01-29 DIAGNOSIS — F319 Bipolar disorder, unspecified: Secondary | ICD-10-CM | POA: Insufficient documentation

## 2020-01-29 DIAGNOSIS — R4182 Altered mental status, unspecified: Secondary | ICD-10-CM | POA: Insufficient documentation

## 2020-01-29 DIAGNOSIS — Z79899 Other long term (current) drug therapy: Secondary | ICD-10-CM | POA: Insufficient documentation

## 2020-01-29 DIAGNOSIS — T43291A Poisoning by other antidepressants, accidental (unintentional), initial encounter: Secondary | ICD-10-CM | POA: Insufficient documentation

## 2020-01-29 DIAGNOSIS — F1410 Cocaine abuse, uncomplicated: Secondary | ICD-10-CM | POA: Insufficient documentation

## 2020-01-29 DIAGNOSIS — F1990 Other psychoactive substance use, unspecified, uncomplicated: Secondary | ICD-10-CM

## 2020-01-29 DIAGNOSIS — F449 Dissociative and conversion disorder, unspecified: Secondary | ICD-10-CM

## 2020-01-29 LAB — BLOOD GAS, ARTERIAL
Acid-base deficit: 2.5 mmol/L — ABNORMAL HIGH (ref 0.0–2.0)
Bicarbonate: 21.8 mmol/L (ref 20.0–28.0)
Drawn by: 331471
FIO2: 21
O2 Saturation: 96.6 %
Patient temperature: 98.6
pCO2 arterial: 38.1 mmHg (ref 32.0–48.0)
pH, Arterial: 7.376 (ref 7.350–7.450)
pO2, Arterial: 97.1 mmHg (ref 83.0–108.0)

## 2020-01-29 LAB — CBC WITH DIFFERENTIAL/PLATELET
Abs Immature Granulocytes: 0.04 10*3/uL (ref 0.00–0.07)
Basophils Absolute: 0.1 10*3/uL (ref 0.0–0.1)
Basophils Relative: 1 %
Eosinophils Absolute: 0.2 10*3/uL (ref 0.0–0.5)
Eosinophils Relative: 1 %
HCT: 37.8 % (ref 36.0–46.0)
Hemoglobin: 12.8 g/dL (ref 12.0–15.0)
Immature Granulocytes: 0 %
Lymphocytes Relative: 24 %
Lymphs Abs: 2.9 10*3/uL (ref 0.7–4.0)
MCH: 31.3 pg (ref 26.0–34.0)
MCHC: 33.9 g/dL (ref 30.0–36.0)
MCV: 92.4 fL (ref 80.0–100.0)
Monocytes Absolute: 1.1 10*3/uL — ABNORMAL HIGH (ref 0.1–1.0)
Monocytes Relative: 9 %
Neutro Abs: 8.1 10*3/uL — ABNORMAL HIGH (ref 1.7–7.7)
Neutrophils Relative %: 65 %
Platelets: 317 10*3/uL (ref 150–400)
RBC: 4.09 MIL/uL (ref 3.87–5.11)
RDW: 13.3 % (ref 11.5–15.5)
WBC: 12.3 10*3/uL — ABNORMAL HIGH (ref 4.0–10.5)
nRBC: 0 % (ref 0.0–0.2)

## 2020-01-29 LAB — COMPREHENSIVE METABOLIC PANEL
ALT: 63 U/L — ABNORMAL HIGH (ref 0–44)
AST: 56 U/L — ABNORMAL HIGH (ref 15–41)
Albumin: 3.9 g/dL (ref 3.5–5.0)
Alkaline Phosphatase: 98 U/L (ref 38–126)
Anion gap: 13 (ref 5–15)
BUN: 23 mg/dL — ABNORMAL HIGH (ref 6–20)
CO2: 21 mmol/L — ABNORMAL LOW (ref 22–32)
Calcium: 8.9 mg/dL (ref 8.9–10.3)
Chloride: 104 mmol/L (ref 98–111)
Creatinine, Ser: 0.97 mg/dL (ref 0.44–1.00)
GFR calc Af Amer: >60 mL/min (ref >60)
GFR calc non Af Amer: >60 mL/min (ref >60)
Glucose, Bld: 89 mg/dL (ref 70–99)
Potassium: 3.8 mmol/L (ref 3.5–5.1)
Sodium: 138 mmol/L (ref 135–145)
Total Bilirubin: 1.2 mg/dL (ref 0.3–1.2)
Total Protein: 6.7 g/dL (ref 6.5–8.1)

## 2020-01-29 LAB — RAPID URINE DRUG SCREEN, HOSP PERFORMED
Amphetamines: NOT DETECTED
Barbiturates: NOT DETECTED
Benzodiazepines: NOT DETECTED
Cocaine: POSITIVE — AB
Opiates: NOT DETECTED
Tetrahydrocannabinol: POSITIVE — AB

## 2020-01-29 LAB — ACETAMINOPHEN LEVEL: Acetaminophen (Tylenol), Serum: <10 ug/mL — ABNORMAL LOW (ref 10–30)

## 2020-01-29 LAB — ETHANOL: Alcohol, Ethyl (B): <10 mg/dL (ref <10)

## 2020-01-29 LAB — SALICYLATE LEVEL: Salicylate Lvl: <7 mg/dL — ABNORMAL LOW (ref 7.0–30.0)

## 2020-01-29 LAB — CK
Total CK: 1047 U/L — ABNORMAL HIGH (ref 38–234)
Total CK: 711 U/L — ABNORMAL HIGH (ref 38–234)

## 2020-01-29 MED ORDER — ACETAMINOPHEN 500 MG PO TABS
1000.0000 mg | ORAL_TABLET | Freq: Once | ORAL | Status: AC
  Administered 2020-01-29: 1000 mg via ORAL
  Filled 2020-01-29: qty 2

## 2020-01-29 MED ORDER — SODIUM CHLORIDE 0.9 % IV SOLN: INTRAVENOUS | Status: DC

## 2020-01-29 MED ORDER — SODIUM CHLORIDE 0.9 % IV BOLUS
2000.0000 mL | Freq: Once | INTRAVENOUS | Status: AC
  Administered 2020-01-29: 2000 mL via INTRAVENOUS

## 2020-01-29 NOTE — ED Provider Notes (Signed)
[facility]-EMERGENCY DEPT Provider Note   CSN: [id number] Arrival date & time: 01/29/20  1120     History Chief Complaint  Patient presents with  . Drug Overdose    Margaret Mccann is a 49 y.o. female.  49 year old female with history of substance abuse, bipolar disorder presents via EMS after increased agitation.  Patient reportedly was found in the street wandering around and was not convinced to go home.  When she arrived at home, her boyfriend states she had had decreased level of consciousness.  She reportedly had taken an unknown amount of medications but this was not reported to have been a suicide attempt.  EMS had listed the patient's medications and she is noted to have taking benzodiazepines as well as antidepressants.  Patient told EMS that she was is having trouble sleeping which is why she took them.  EMS was called and patient alternated between being combative to being able to carry a conversation.  No medications were given by them and blood sugar was above 100 and patient transported here.  No further history obtainable due to her current state.  On arrival here patient required four-point restraints due to her combative state        Past Medical History:  Diagnosis Date  . Anxiety   . Bipolar 1 disorder (HCC)   . Depression   . Morgellons syndrome     Patient Active Problem List   Diagnosis Date Noted  . MDD (major depressive disorder), recurrent episode, severe (HCC) 06/04/2018  . Schizophrenia (HCC) 06/04/2018  . Suicide attempt (HCC)   . Seizure (HCC) 06/02/2018  . Chest pain 07/13/2015  . Palpitations 07/13/2015  . Cocaine abuse (HCC) 06/03/2012  . 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 APPENDECTOMY       OB History   No obstetric history on file.     Family History   Problem Relation Age of Onset  . Heart attack Father 4  . Stroke Father   . Breast cancer Paternal Aunt   . Ulcerative colitis Neg Hx   . Esophageal cancer Neg Hx     Social History   Tobacco Use  . Smoking status: Former Smoker    Packs/day: 0.50    Types: Cigarettes  . Smokeless tobacco: Never Used  Substance Use Topics  . Alcohol use: Yes    Comment: occas  . Drug use: Not Currently    Comment: not currently-was +cocaine and amphetamines, clean x3 years    Home Medications Prior to Admission medications   Medication Sig Start Date End Date Taking? Authorizing Provider  benzonatate (TESSALON) 100 MG capsule Take 1 capsule (100 mg total) by mouth every 8 (eight) hours. 11/17/19   Joy, Shawn C, PA-C  buPROPion (WELLBUTRIN XL) 150 MG 24 hr tablet Take 450 mg by mouth daily.     [provider]  cloNIDine (CATAPRES) 0.2 MG tablet Take 0.2 mg by mouth at bedtime.    [provider]  FLUoxetine (PROZAC) 20 MG capsule Take 1 capsule (20 mg total) by mouth daily. 06/09/18   Charm Rings, NP  FLUoxetine (PROZAC) 40 MG capsule Take 40 mg by mouth daily.    [provider]  gabapentin (NEURONTIN) 600 MG tablet Take 2 tablets (1,200 mg total) by mouth 2 (two) times daily. 04/02/18   Eber Hong, MD  hydrOXYzine (ATARAX/VISTARIL) 25  MG tablet Take 1 tablet (25 mg total) by mouth 3 (three) times daily as needed for anxiety. 06/08/18   Charm Rings, NP  hydrOXYzine (VISTARIL) 50 MG capsule Take 50 mg by mouth at bedtime. 03/24/18   [provider]  ibuprofen (ADVIL,MOTRIN) 200 MG tablet Take 200-800 mg by mouth every 6 (six) hours as needed for moderate pain.    [provider]  mirtazapine (REMERON) 15 MG tablet Take 1 tablet (15 mg total) by mouth at bedtime. 06/08/18   Charm Rings, NP  mirtazapine (REMERON) 30 MG tablet Take 1 tablet (30 mg total) by mouth at bedtime. 04/02/18   Eber Hong, MD  naproxen sodium (ALEVE) 220 MG tablet Take  220 mg by mouth 2 (two) times daily as needed (pain).    [provider]  Norethin Ace-Eth Estrad-FE (MINASTRIN 24 FE PO) Take 1 tablet by mouth daily.    [provider]  Norethindrone Acetate-Ethinyl Estrad-FE (BLISOVI 24 FE) 1-20 MG-MCG(24) tablet Take 1 tablet by mouth daily.    [provider]  ondansetron (ZOFRAN ODT) 4 MG disintegrating tablet Take 1 tablet (4 mg total) by mouth every 8 (eight) hours as needed for nausea or vomiting. 11/17/19   Joy, Shawn C, PA-C  oxyCODONE-acetaminophen (PERCOCET) 5-325 MG tablet Take 1-2 tablets by mouth every 6 (six) hours as needed. 04/02/18   Eber Hong, MD  pantoprazole (PROTONIX) 40 MG tablet Take 1 tablet (40 mg total) by mouth 2 (two) times daily. Patient not taking: Reported on 04/02/2018 06/20/17   Esterwood, Amy S, PA-C  pantoprazole (PROTONIX) 40 MG tablet Take 1 tablet (40 mg total) by mouth daily. 06/09/18   Charm Rings, NP  predniSONE (STERAPRED UNI-PAK 21 TAB) 10 MG (21) TBPK tablet Take 6 tabs (60mg ) day 1, 5 tabs (50mg ) day 2, 4 tabs (40mg ) day 3, 3 tabs (30mg ) day 4, 2 tabs (20mg ) day 5, and 1 tab (10mg ) day 6. 11/17/19   Joy, Shawn C, PA-C  promethazine (PHENERGAN) 25 MG tablet Take 1 tablet (25 mg total) by mouth every 6 (six) hours as needed for nausea or vomiting. Patient not taking: Reported on 04/02/2018 05/29/17   , MD  QUEtiapine (SEROQUEL) 100 MG tablet Take 1 tablet (100 mg total) by mouth at bedtime. 06/08/18   , NP  traMADol (ULTRAM) 50 MG tablet Take 1 tablet (50 mg total) by mouth every 6 (six) hours as needed. Patient not taking: Reported on 04/02/2018 05/25/17   Esterwood, Amy S, PA-C  traZODone (DESYREL) 100 MG tablet Take 1 tablet (100 mg total) by mouth at bedtime. For sleep. 06/07/12 04/02/18  Readling, 06/10/18, MD    Allergies    Lamictal [lamotrigine], Erythromycin, Lamictal [lamotrigine], and Tramadol  Review of Systems   Review of Systems  Unable to perform ROS: Acuity  of condition    Physical Exam Updated Vital Signs There were no vitals taken for this visit.  Physical Exam Vitals and nursing note reviewed.  Constitutional:      General: She is not in acute distress.    Appearance: Normal appearance. She is well-developed. She is not toxic-appearing.  HENT:     Head: Normocephalic and atraumatic.  Eyes:     General: Lids are normal.     Conjunctiva/sclera: Conjunctivae normal.     Pupils: Pupils are equal, round, and reactive to light.  Neck:     Thyroid: No thyroid mass.     Trachea: No tracheal  deviation.  Cardiovascular:     Rate and Rhythm: Normal rate and regular rhythm.     Heart sounds: Normal heart sounds. No murmur. No gallop.   Pulmonary:     Effort: Pulmonary effort is normal. No respiratory distress.     Breath sounds: Normal breath sounds. No stridor. No decreased breath sounds, wheezing, rhonchi or rales.  Abdominal:     General: Bowel sounds are normal. There is no distension.     Palpations: Abdomen is soft.     Tenderness: There is no abdominal tenderness. There is no rebound.  Musculoskeletal:        General: No tenderness. Normal range of motion.     Cervical back: Normal range of motion and neck supple.  Skin:    General: Skin is warm and dry.     Findings: No abrasion or rash.  Neurological:     Mental Status: She is lethargic, disoriented and confused.     GCS: GCS eye subscore is 3. GCS verbal subscore is 3. GCS motor subscore is 5.     Comments: Patient uncooperative with exam but does move all 4 extremities.  Psychiatric:        Attention and Perception: She is inattentive.        Speech: She is noncommunicative.     ED Results / Procedures / Treatments   Labs (all labs ordered are listed, but only abnormal results are displayed) Labs Reviewed  ETHANOL  RAPID URINE DRUG SCREEN, HOSP PERFORMED  SALICYLATE LEVEL  ACETAMINOPHEN LEVEL  CBC WITH DIFFERENTIAL/PLATELET  COMPREHENSIVE METABOLIC PANEL  BLOOD  GAS, ARTERIAL  CK    EKG None  Radiology No results found.  Procedures Procedures (including critical care time)  Medications Ordered in ED Medications  0.9 %  sodium chloride infusion (has no administration in time range)    ED Course  I have reviewed the triage vital signs and the nursing notes.  Pertinent labs & imaging results that were available during my care of the patient were reviewed by me and considered in my medical decision making (see chart for details).    MDM Rules/Calculators/A&P                      Patient usually combative.  Required four-point restraints.  Was reevaluated multiple times and gradually came back to her baseline.  States that she has less anxiety which is causing her to have some jerking movements.  Labs reviewed and shows elevated CK of over thousand.  Will give 2 L of saline and repeat CK.  Head CT negative.  Will discharge when sober.care signed out to next provider   CRITICAL CARE Performed by: Leota Jacobsen Total critical care time: 50 minutes Critical care time was exclusive of separately billable procedures and treating other patients. Critical care was necessary to treat or prevent imminent or life-threatening deterioration. Critical care was time spent personally by me on the following activities: development of treatment plan with patient and/or surrogate as well as nursing, discussions with consultants, evaluation of patient's response to treatment, examination of patient, obtaining history from patient or surrogate, ordering and performing treatments and interventions, ordering and review of laboratory studies, ordering and review of radiographic studies, pulse oximetry and re-evaluation of patient's condition. Final Clinical Impression(s) / ED Diagnoses Final diagnoses:  None    Rx / DC Orders ED Discharge Orders    None       Lacretia Leigh, MD 01/29/20 1526

## 2020-01-29 NOTE — ED Provider Notes (Signed)
Patient is now back at baseline and really wanting to go home.  Repeat CK has improved now from over thousand to 700.  Patient is stable for discharge   Gwyneth Sprout, MD 01/29/20 [phone]

## 2020-01-29 NOTE — ED Triage Notes (Addendum)
Pt BIB EMS. Pt was found in the street running around. Pt seems to have overdosed on sleep medications and alcohol. Pt is combative and uncooperative when trying to wake her up, therefore, EMS put her in restraints. There is a possibility she could have overdosed on mirtazapine, zolpidem, and/or gabapentin last night. ETOH on board as well.   20G LH  BP 114/58 CBG 92 HR 90

## 2020-04-13 ENCOUNTER — Other Ambulatory Visit: Payer: Self-pay

## 2020-04-13 ENCOUNTER — Ambulatory Visit (HOSPITAL_COMMUNITY)
Admission: EM | Admit: 2020-04-13 | Discharge: 2020-04-13 | Disposition: A | Discharge status: Home / Self Care | Payer: No Payment, Other | Attending: Registered Nurse | Admitting: Registered Nurse

## 2020-04-13 DIAGNOSIS — F1410 Cocaine abuse, uncomplicated: Secondary | ICD-10-CM | POA: Diagnosis present

## 2020-04-13 DIAGNOSIS — F332 Major depressive disorder, recurrent severe without psychotic features: Secondary | ICD-10-CM | POA: Diagnosis present

## 2020-04-13 DIAGNOSIS — F329 Major depressive disorder, single episode, unspecified: Secondary | ICD-10-CM | POA: Insufficient documentation

## 2020-04-13 DIAGNOSIS — E669 Obesity, unspecified: Secondary | ICD-10-CM | POA: Insufficient documentation

## 2020-04-13 DIAGNOSIS — Z6825 Body mass index (BMI) 25.0-25.9, adult: Secondary | ICD-10-CM | POA: Insufficient documentation

## 2020-04-13 NOTE — Discharge Instructions (Signed)
Please contact [facility] at [phone] to set appointment with therapist.

## 2020-04-13 NOTE — [facility] Counselor (Signed)
Counselor unable to make appointment for patient as after 5:00 PM. Will leave message with outpatient. She is planning on calling tomorrow to initiate appointment.
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
## 2020-04-13 NOTE — Progress Notes (Signed)
Margaret Mccann received her AVS and her questiions were answered. Her personal belongings were returned and she was escorted to the front door.

## 2020-04-13 NOTE — ED Provider Notes (Signed)
Behavioral Health Medical Screening Exam  Margaret Mccann Margaret Mccann is a 49 y.o. female patient presented to [facility] with complaints of depression and recent relapse on cocaine.  Patient states that she currently has outpatient medication management but would like to start therapy. During evaluation Margaret Mccann is alert/oriented x 4; calm/cooperative; and mood is congruent with affect.  She does not appear to be responding to internal/external stimuli or delusional thoughts.  Patient denies suicidal/self-harm/homicidal ideation, psychosis, and paranoia.  Patient answered question appropriately.       Total Time spent with patient: 30 minutes  Psychiatric Specialty Exam  Presentation  General Appearance:Appropriate for Environment;Casual  Eye Contact:Good  Speech:Clear and Coherent;Normal Rate  Speech Volume:Normal  Handedness:Right   Mood and Affect  Mood:Depressed  Affect:Appropriate;Congruent   Thought Process  Thought Processes:Coherent;Goal Directed  Descriptions of Associations:Intact  Orientation:Full (Time, Place and Person)  Thought Content:WDL;Logical  Hallucinations:None  Ideas of Reference:None  Suicidal Thoughts:No  Homicidal Thoughts:No   Sensorium  Memory:Immediate Good;Recent Good;Remote Good  Judgment:Intact  Insight:Present   Executive Functions  Concentration:Good  Attention Span:Good  Recall:Good  Fund of Knowledge:Good  Language:Good   Psychomotor Activity  Psychomotor Activity:Normal   Assets  Assets:Communication Skills;Desire for Improvement;Housing;Physical Health;Social Support   Sleep  Sleep:Good  Number of hours: No data recorded  Physical Exam: Physical Exam Vitals and nursing note reviewed.  Constitutional:      Appearance: She is obese.  Pulmonary:     Effort: Pulmonary effort is normal.  Musculoskeletal:        General: Normal range of motion.  Skin:    General: Skin is warm and dry.   Neurological:     Mental Status: She is alert.  Psychiatric:        Attention and Perception: Attention and perception normal.        Mood and Affect: Mood normal.        Speech: Speech normal.        Behavior: Behavior normal. Behavior is cooperative.        Thought Content: Thought content normal.        Cognition and Memory: Cognition and memory normal.    Review of Systems  Psychiatric/Behavioral: Negative for memory loss and suicidal ideas. Depression: Stable. Substance abuse: Cocaine.  All other systems reviewed and are negative.  Blood pressure 105/80, pulse 70, temperature 98.3 F (36.8 C), temperature source Oral, resp. rate 16, height 5\' 3"  (1.6 m), weight 145 lb (65.8 kg), SpO2 100 %. Body mass index is 25.69 kg/m.  Musculoskeletal: Strength & Muscle Tone: within normal limits Gait & Station: normal Patient leans: N/A   Recommendations:  TTS helping with resources for therapy.  Follow up with primary psychiatrist.  Safety plan discussed  Based on my evaluation the patient does not appear to have an emergency medical condition.   Disposition: No evidence of imminent risk to self or others at present.   Patient does not meet criteria for psychiatric inpatient admission. Supportive therapy provided about ongoing stressors. Discussed crisis plan, support from social network, calling 911, coming to the Emergency Department, and calling Suicide Hotline.  Lanett Lasorsa, NP 04/13/2020, 6:26 PM

## 2020-04-28 ENCOUNTER — Other Ambulatory Visit: Payer: Self-pay

## 2020-04-28 ENCOUNTER — Ambulatory Visit (INDEPENDENT_AMBULATORY_CARE_PROVIDER_SITE_OTHER): Payer: No Payment, Other | Admitting: Licensed Clinical Social Worker

## 2020-04-28 DIAGNOSIS — F332 Major depressive disorder, recurrent severe without psychotic features: Secondary | ICD-10-CM
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
## 2020-06-11 ENCOUNTER — Other Ambulatory Visit: Payer: Self-pay

## 2020-06-11 ENCOUNTER — Ambulatory Visit (INDEPENDENT_AMBULATORY_CARE_PROVIDER_SITE_OTHER): Payer: No Payment, Other | Admitting: Licensed Clinical Social Worker

## 2020-06-11 DIAGNOSIS — F332 Major depressive disorder, recurrent severe without psychotic features: Secondary | ICD-10-CM

## 2020-06-14 NOTE — Progress Notes (Signed)
THERAPIST PROGRESS NOTE  Session Time: 55 min  Participation Level: Active  Behavioral Response: CasualAlertAnxious and Depressed  Type of Therapy: Individual Therapy  Treatment Goals addressed: Anxiety, Communication: Depression and Coping  Interventions: Motivational Interviewing, Solution Focused, Supportive and Other: Additional Assessment  Summary: Margaret Mccann is a 49 y.o. female who presents with hx of MDD. This date pt comes for in person session per her preference. Pt prefers to be called Margaret Mccann. This is the first session since initial eval d/t scheduling complications. Pt reports she is better than she was on initial eval. She endorses ongoing. but improved, feelings of dep. She states dep is currently a "4-5" on a scale of 0-10 with 10 the worst. She presents as restless and anxious this date. Pt endorses feelings of anx, speaks of "PTSD" hx and easily being startled. She denies SI, she is still crying at times but much less, she is better at Mccann care yet states she still needs to improve. Pt has gotten her dog, Fin, back which she reports is helping her to cope better and making sure she is getting out of the house. She states it provides some socialization because "he is like a magnet" and people stop her to talk and want to pet Fin. Pt did start the walking program agreed upon last session even before getting Fin back. She states she always feels better after walking and states "I think it may be better than Prozac". Pt is taking meds as prescribed. She reports she had to go a few days without her Gabapentin d/t change in the law in [place], which set her back. She is back on this and starting to feel the benefits. Discussed changing med management to [facility] as pt still has no income and can not afford costs of med provider she is using. LCSW facilitated med management appt for pt. Pt denies any cocaine use. She reports she is still drinking some limited alcohol weekly yet  realizes this is a depressant. Pt remains out of contact with her children who she reports are disinterested in her d/t her substance use and MH problems. Today she reports when she OD she called her dtr to say goodbye. She did not call her son but she did text him. She becomes tearful and states how regretful she is about doing this and causing them trauma She does want to try to reconnect with children when she is more improved. Pt reports she is looking for work but has not yet been successful. She states she wants to start with part time, low stress position to give her structure and reduce isolation. Pt reports her [employer] services have been cut off d/t nonpayment. She is able to use her phone for searching the Internet. Addressed adding structure now with the aid of a planner/calendar and reviewed all of the things she might place on the calendar. Referral to [place] works and [employer]. Referral to [facility] [place] re groups in this facility. She has not connected with AA/NA and states she will still consider doing this. Pt able to endorse hope. LCSW reviewed poc and finally getting to weekly sessions after significant gap. Pt states appreciation for care.        Suicidal/Homicidal: Nowithout intent/plan  Therapist Response: Pt very receptive and participatory. (Pt drives and has her own car.)  Plan: Return again in 1 week.  Diagnosis: Axis I: Major Depression, Recurrent severe    Axis II: Deferred  [place] Sink,  LCSW 06/14/2020

## 2020-06-18 ENCOUNTER — Ambulatory Visit (HOSPITAL_COMMUNITY): Payer: Self-pay | Admitting: Licensed Clinical Social Worker

## 2020-06-18 ENCOUNTER — Telehealth (HOSPITAL_COMMUNITY): Payer: Self-pay | Admitting: Licensed Clinical Social Worker

## 2020-06-18 NOTE — Telephone Encounter (Signed)
LCSW phoned pt when she failed to show up for scheduled in person session. Call went to pt's vm. Left detailed message including date and time for next scheduled session.

## 2020-06-25 ENCOUNTER — Ambulatory Visit (HOSPITAL_COMMUNITY): Payer: Self-pay | Admitting: Licensed Clinical Social Worker

## 2020-06-25 ENCOUNTER — Telehealth (HOSPITAL_COMMUNITY): Payer: Self-pay | Admitting: Licensed Clinical Social Worker

## 2020-06-25 NOTE — Telephone Encounter (Signed)
Pt scheduled for in person session this morning at 8a. When she failed to show up LCSW called pt, call went to vm. LCSW left a detailed message re purpose of call. Requested call back to provide update on her well-being.

## 2020-07-02 ENCOUNTER — Ambulatory Visit (INDEPENDENT_AMBULATORY_CARE_PROVIDER_SITE_OTHER): Payer: No Payment, Other | Admitting: Licensed Clinical Social Worker

## 2020-07-02 ENCOUNTER — Other Ambulatory Visit: Payer: Self-pay

## 2020-07-02 DIAGNOSIS — Z634 Disappearance and death of family member: Secondary | ICD-10-CM

## 2020-07-02 DIAGNOSIS — F332 Major depressive disorder, recurrent severe without psychotic features: Secondary | ICD-10-CM
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
## 2020-07-16 ENCOUNTER — Ambulatory Visit (HOSPITAL_COMMUNITY): Payer: No Payment, Other | Admitting: Licensed Clinical Social Worker

## 2020-07-19 ENCOUNTER — Telehealth (HOSPITAL_COMMUNITY): Payer: Self-pay | Admitting: Licensed Clinical Social Worker

## 2020-07-19 NOTE — Telephone Encounter (Signed)
LCSW called pt when she failed to show up for her 10am appt. Phone call went to vm. LCSW left detailed message re purpose of call. LCSW advised pt of significant concern for pt's well being and asked for a call back. After phoning pt LCSW called pt's mother, with whom the pt is living at this time. The phone rang numerous times with no answer and no ability to leave a vm for mother.

## 2020-07-23 ENCOUNTER — Other Ambulatory Visit (HOSPITAL_COMMUNITY): Payer: Self-pay | Admitting: Licensed Clinical Social Worker

## 2020-07-30 ENCOUNTER — Other Ambulatory Visit: Payer: Self-pay

## 2020-07-30 ENCOUNTER — Encounter (HOSPITAL_COMMUNITY): Payer: Self-pay | Admitting: Psychiatry

## 2020-07-30 ENCOUNTER — Ambulatory Visit (INDEPENDENT_AMBULATORY_CARE_PROVIDER_SITE_OTHER): Payer: No Payment, Other | Admitting: Psychiatry

## 2020-07-30 VITALS — BP 151/103 | HR 98 | Ht 63.0 in | Wt 153.0 lb

## 2020-07-30 DIAGNOSIS — F4321 Adjustment disorder with depressed mood: Secondary | ICD-10-CM | POA: Diagnosis not present

## 2020-07-30 DIAGNOSIS — F4310 Post-traumatic stress disorder, unspecified: Secondary | ICD-10-CM | POA: Diagnosis not present

## 2020-07-30 DIAGNOSIS — F331 Major depressive disorder, recurrent, moderate: Secondary | ICD-10-CM | POA: Insufficient documentation

## 2020-07-30 DIAGNOSIS — F339 Major depressive disorder, recurrent, unspecified: Secondary | ICD-10-CM | POA: Diagnosis not present

## 2020-07-30 MED ORDER — CLONAZEPAM 0.5 MG PO TABS: 0.5000 mg | ORAL_TABLET | Freq: Two times a day (BID) | ORAL | 1 refills | Status: DC | PRN

## 2020-07-30 MED ORDER — GABAPENTIN 600 MG PO TABS: 1200.0000 mg | ORAL_TABLET | Freq: Two times a day (BID) | ORAL | 1 refills | Status: DC

## 2020-07-30 MED ORDER — MIRTAZAPINE 30 MG PO TABS: 30.0000 mg | ORAL_TABLET | Freq: Every day | ORAL | 1 refills | Status: DC

## 2020-07-30 MED ORDER — CLONIDINE HCL 0.2 MG PO TABS: 0.2000 mg | ORAL_TABLET | Freq: Every day | ORAL | 1 refills | Status: DC

## 2020-07-30 MED ORDER — FLUOXETINE HCL 40 MG PO CAPS: 40.0000 mg | ORAL_CAPSULE | Freq: Every day | ORAL | 1 refills | Status: DC

## 2020-07-30 NOTE — Progress Notes (Signed)
Psychiatric Initial Adult Assessment   Patient Identification: Margaret Mccann MRN:  [id number] Date of Evaluation:  07/30/2020   Referral Source: PCP  Chief Complaint:   Chief Complaint    New Patient (Initial Visit); Medication Management     Visit Diagnosis:    ICD-10-CM   1. MDD (major depressive disorder), recurrent episode, moderate (HCC)  F33.1   2. Grief  F43.21   3. PTSD (post-traumatic stress disorder)  F43.10     History of Present Illness: This is a 49 year old female with history of MDD, PTSD, anxiety, substance abuse now seen for evaluation and establishing care.  Patient reported that she was being managed at a different clinic however due to the hefty co-pay she would like to switch her care to [place].  She informed that she has history of PTSD which she developed after witnessing her sister died due to overdose.  She informed that back in 2005 her sister had committed suicide by overdosing on bottles of several of her medications and one of them was SSRI. She informed that currently she is undergoing a lot of stress and she is very anxious.  She informed that her boyfriend of 3 years committed suicide on October 5.  She stated that she found his body hanging in the kitchen when she went to look for him.  She is not sure how long he was down there before she found his body. She stated that she is having a hard time getting over this loss.  She has since then moved into live with her parents were very supportive of her. She informed that she also has 2 young children who do not live with her but she talks to them frequently on the phone. She is currently endorsing depressed mood, frequent crying spells, extreme anxiety, low energy levels, poor sleep and poor appetite. She reported that she is consistently taking Prozac 40 mg, mirtazapine 30 mg at bedtime, gabapentin 1200 mg twice daily, clonidine 0.2 mg at bedtime and Vistaril 50 mg 3 times daily.  She stated that none of  these medicines seem to be working too well currently because of what she is going through. She stated that she has history of abusing alcohol in the past but she has not relapsed recently.  She informed that she used cocaine a few times and has not relapsed on that one either.  She denied using any illicit substances at present. She stated that she is trying hard not to go back to using any illicit substances and her parents are supporting her through this journey. She stated that she would like to request for medication for short-term course so that she can overcome this phase of anxiety and stress. Writer asked her what particularly was she asking for and she replied she was thinking about something like Xanax.  Writer informed her that due to her history of substance abuse using highly addictive benzodiazepine like Xanax or lorazepam would not be recommended.  Writer however did agree to give her a short-term course of clonazepam 0.5 mg twice daily as needed for anxiety. Patient was very appreciative of this. She denied any manic or hypomanic symptoms.  She denied any delusions or hallucinations. She denied any ongoing suicidal ideations or plans.  She stated that she loves her children a lot and she will never do something like this to them.  She also verbalized that she is the only child left for her parents who have lost their other child due to  suicide and therefore she has no intent to hurt herself by any means.    Past Psychiatric History: MDD , anxiety, PTSD, Substance abuse (cocaine, alcohol)  Previous Psychotropic Medications: Yes -In addition to the medicines she is currently taking she has also taken Seroquel and Wellbutrin in the past.  She informed that she felt a seizure after using Wellbutrin and therefore does not want to take it again.  Substance Abuse History in the last 12 months:  Yes.    Consequences of Substance Abuse: Medical Consequences:  ED visit  Past Medical  History:  Past Medical History:  Diagnosis Date  . Anxiety   . Bipolar 1 disorder (HCC)   . Depression   . Morgellons syndrome     Past Surgical History:  Procedure Laterality Date  . APPENDECTOMY      Family Psychiatric History: Sister - committed suicide in 2005 by overdosing.  Family History:  Family History  Problem Relation Age of Onset  . Heart attack Father 32  . Stroke Father   . Breast cancer Paternal Aunt   . Ulcerative colitis Neg Hx   . Esophageal cancer Neg Hx     Social History:   Social History   Socioeconomic History  . Marital status: Single    Spouse name: Not on file  . Number of children: Not on file  . Years of education: Not on file  . Highest education level: Not on file  Occupational History  . Not on file  Tobacco Use  . Smoking status: Former Smoker    Packs/day: 0.50    Types: Cigarettes  . Smokeless tobacco: Never Used  Vaping Use  . Vaping Use: Never used  Substance and Sexual Activity  . Alcohol use: Yes    Comment: occas  . Drug use: Not Currently    Comment: not currently-was +cocaine and amphetamines, clean x3 years  . Sexual activity: Yes    Birth control/protection: Pill  Other Topics Concern  . Not on file  Social History Narrative   ** Merged History Encounter **       Social Determinants of Health   Financial Resource Strain:   . Difficulty of Paying Living Expenses: Not on file  Food Insecurity:   . Worried About Programme researcher, broadcasting/film/video in the Last Year: Not on file  . Ran Out of Food in the Last Year: Not on file  Transportation Needs:   . Lack of Transportation (Medical): Not on file  . Lack of Transportation (Non-Medical): Not on file  Physical Activity:   . Days of Exercise per Week: Not on file  . Minutes of Exercise per Session: Not on file  Stress:   . Feeling of Stress : Not on file  Social Connections:   . Frequency of Communication with Friends and Family: Not on file  . Frequency of Social  Gatherings with Friends and Family: Not on file  . Attends Religious Services: Not on file  . Active Member of Clubs or Organizations: Not on file  . Attends Banker Meetings: Not on file  . Marital Status: Not on file    Additional Social History: Currently living with her parents, recently lost her boyfriend of 3 years to suicide.  Is divorced and has 2 children who live with their father.  Sees her children and talks of them regularly.  Has chosen not to spend too much time with them lately due to her current state of grief.   Allergies:  Allergies  Allergen Reactions  . Lamictal [Lamotrigine] Anaphylaxis and Rash    Rash, fever,  Trudie Buckler Syndrom  . Sulfamethoxazole-Trimethoprim Rash  . Erythromycin Nausea And Vomiting  . Lamictal [Lamotrigine] Other (See Comments)    Viviann Spare Johnson's Syndrom  . Tramadol Other (See Comments)    Pt had a seizure after taking tramadol  . Levetiracetam Anxiety    Metabolic Disorder Labs: No results found for: HGBA1C, MPG No results found for: PROLACTIN No results found for: CHOL, TRIG, HDL, CHOLHDL, VLDL, LDLCALC No results found for: TSH  Therapeutic Level Labs: No results found for: LITHIUM No results found for: CBMZ No results found for: VALPROATE  Current Medications: Current Outpatient Medications  Medication Sig Dispense Refill  . cloNIDine (CATAPRES) 0.2 MG tablet Take 0.2 mg by mouth at bedtime.    Marland Kitchen FLUoxetine (PROZAC) 40 MG capsule Take 40 mg by mouth daily.    Marland Kitchen gabapentin (NEURONTIN) 600 MG tablet Take 2 tablets (1,200 mg total) by mouth 2 (two) times daily. 120 tablet 1  . hydrOXYzine (VISTARIL) 50 MG capsule Take 50 mg by mouth at bedtime.  1  . ibuprofen (ADVIL,MOTRIN) 200 MG tablet Take 200-800 mg by mouth every 6 (six) hours as needed for moderate pain.    . mirtazapine (REMERON) 30 MG tablet Take 30 mg by mouth at bedtime.    . traMADol (ULTRAM) 50 MG tablet Take 1 tablet (50 mg total) by mouth every  6 (six) hours as needed. 25 tablet 0  . hydrOXYzine (ATARAX/VISTARIL) 25 MG tablet Take 1 tablet (25 mg total) by mouth 3 (three) times daily as needed for anxiety. 30 tablet 0   Current Facility-Administered Medications  Medication Dose Route Frequency Provider Last Rate Last Admin  . 0.9 %  sodium chloride infusion  500 mL Intravenous Continuous Hilarie Fredrickson, MD        Musculoskeletal: Strength & Muscle Tone: within normal limits Gait & Station: normal Patient leans: N/A  Psychiatric Specialty Exam: Review of Systems  Blood pressure (!) 151/103, pulse 98, height 5\' 3"  (1.6 m), weight 153 lb (69.4 kg), SpO2 95 %.Body mass index is 27.1 kg/m.  General Appearance: Fairly Groomed  Eye Contact:  Good  Speech:  Clear and Coherent and Normal Rate  Volume:  Normal  Mood:  Dysphoric and Anxious  Affect:  Congruent  Thought Process:  Goal Directed and Descriptions of Associations: Intact  Orientation:  Full (Time, Place, and Person)  Thought Content:  Logical  Suicidal Thoughts:  No  Homicidal Thoughts:  No  Memory:  Immediate;   Good Recent;   Good  Judgement:  Fair  Insight:  Fair  Psychomotor Activity:  Restlessness  Concentration:  Concentration: Good and Attention Span: Good  Recall:  Good  Fund of Knowledge:Good  Language: Good  Akathisia:  Negative  Handed:  Right  AIMS (if indicated):  0  Assets:  Communication Skills Desire for Improvement Financial Resources/Insurance Housing Social Support  ADL's:  Intact  Cognition: WNL  Sleep:  Fair   Screenings: AIMS     [facility]  AIMS Total Score 0    AUDIT     [facility]  Alcohol Use Disorder Identification Test Final Score (AUDIT) 0      Assessment and Plan: Patient seen for initial evaluation.  Patient seems to be quite overwhelmed due to recent loss of her  boyfriend  of 3 years due to suicide.  Patient stated that she has history of abusing cocaine and alcohol in the past but has not relapsed lately.  She is well supported by her family.  She requested for a short-term course of medicine to help her with extreme anxiety.  Writer agreed to give her a short-term course of clonazepam 0.5 mg twice daily as needed with a plan of not getting any further refills in the future.  1. Major depressive disorder, recurrent episode with anxious distress (HCC)  - cloNIDine (CATAPRES) 0.2 MG tablet; Take 1 tablet (0.2 mg total) by mouth at bedtime.  Dispense: 30 tablet; Refill: 1 - FLUoxetine (PROZAC) 40 MG capsule; Take 1 capsule (40 mg total) by mouth daily.  Dispense: 30 capsule; Refill: 1 - gabapentin (NEURONTIN) 600 MG tablet; Take 2 tablets (1,200 mg total) by mouth 2 (two) times daily.  Dispense: 120 tablet; Refill: 1 - mirtazapine (REMERON) 30 MG tablet; Take 1 tablet (30 mg total) by mouth at bedtime.  Dispense: 30 tablet; Refill: 1 - Start clonazePAM (KLONOPIN) 0.5 MG tablet; Take 1 tablet (0.5 mg total) by mouth 2 (two) times daily as needed for anxiety.  Dispense: 60 tablet; Refill: 1  2. Grief   3. PTSD (post-traumatic stress disorder)  - cloNIDine (CATAPRES) 0.2 MG tablet; Take 1 tablet (0.2 mg total) by mouth at bedtime.  Dispense: 30 tablet; Refill: 1 - FLUoxetine (PROZAC) 40 MG capsule; Take 1 capsule (40 mg total) by mouth daily.  Dispense: 30 capsule; Refill: 1 - mirtazapine (REMERON) 30 MG tablet; Take 1 tablet (30 mg total) by mouth at bedtime.  Dispense: 30 tablet; Refill: 1  F/up in 6 weeks.   Zena AmosMandeep Jerick Khachatryan, MD 11/5/202111:33 AM

## 2020-08-04 ENCOUNTER — Emergency Department (HOSPITAL_COMMUNITY): Payer: Self-pay

## 2020-08-04 ENCOUNTER — Inpatient Hospital Stay (HOSPITAL_COMMUNITY): Payer: Self-pay

## 2020-08-04 ENCOUNTER — Inpatient Hospital Stay (HOSPITAL_COMMUNITY)
Admission: EM | Admit: 2020-08-04 | Discharge: 2020-08-11 | DRG: 917 | Disposition: A | Discharge status: Other Acute Inpatient Hospital | Payer: Self-pay | Attending: Internal Medicine | Admitting: Internal Medicine

## 2020-08-04 ENCOUNTER — Encounter (HOSPITAL_COMMUNITY): Payer: Self-pay | Admitting: *Deleted

## 2020-08-04 ENCOUNTER — Other Ambulatory Visit: Payer: Self-pay

## 2020-08-04 DIAGNOSIS — G929 Unspecified toxic encephalopathy: Secondary | ICD-10-CM | POA: Diagnosis present

## 2020-08-04 DIAGNOSIS — Z20822 Contact with and (suspected) exposure to covid-19: Secondary | ICD-10-CM | POA: Diagnosis present

## 2020-08-04 DIAGNOSIS — Z882 Allergy status to sulfonamides status: Secondary | ICD-10-CM

## 2020-08-04 DIAGNOSIS — Z8249 Family history of ischemic heart disease and other diseases of the circulatory system: Secondary | ICD-10-CM

## 2020-08-04 DIAGNOSIS — Z803 Family history of malignant neoplasm of breast: Secondary | ICD-10-CM

## 2020-08-04 DIAGNOSIS — T50902A Poisoning by unspecified drugs, medicaments and biological substances, intentional self-harm, initial encounter: Secondary | ICD-10-CM

## 2020-08-04 DIAGNOSIS — R579 Shock, unspecified: Secondary | ICD-10-CM | POA: Diagnosis present

## 2020-08-04 DIAGNOSIS — F4310 Post-traumatic stress disorder, unspecified: Secondary | ICD-10-CM | POA: Diagnosis present

## 2020-08-04 DIAGNOSIS — J9602 Acute respiratory failure with hypercapnia: Secondary | ICD-10-CM | POA: Diagnosis present

## 2020-08-04 DIAGNOSIS — Z87891 Personal history of nicotine dependence: Secondary | ICD-10-CM

## 2020-08-04 DIAGNOSIS — Z79899 Other long term (current) drug therapy: Secondary | ICD-10-CM

## 2020-08-04 DIAGNOSIS — E876 Hypokalemia: Secondary | ICD-10-CM

## 2020-08-04 DIAGNOSIS — Z823 Family history of stroke: Secondary | ICD-10-CM

## 2020-08-04 DIAGNOSIS — Z888 Allergy status to other drugs, medicaments and biological substances status: Secondary | ICD-10-CM

## 2020-08-04 DIAGNOSIS — F1410 Cocaine abuse, uncomplicated: Secondary | ICD-10-CM | POA: Diagnosis present

## 2020-08-04 DIAGNOSIS — Z885 Allergy status to narcotic agent status: Secondary | ICD-10-CM

## 2020-08-04 DIAGNOSIS — R062 Wheezing: Secondary | ICD-10-CM

## 2020-08-04 DIAGNOSIS — F319 Bipolar disorder, unspecified: Secondary | ICD-10-CM | POA: Diagnosis present

## 2020-08-04 DIAGNOSIS — J9601 Acute respiratory failure with hypoxia: Secondary | ICD-10-CM

## 2020-08-04 DIAGNOSIS — F1910 Other psychoactive substance abuse, uncomplicated: Secondary | ICD-10-CM

## 2020-08-04 DIAGNOSIS — F332 Major depressive disorder, recurrent severe without psychotic features: Secondary | ICD-10-CM | POA: Diagnosis present

## 2020-08-04 DIAGNOSIS — T424X2A Poisoning by benzodiazepines, intentional self-harm, initial encounter: Principal | ICD-10-CM | POA: Diagnosis present

## 2020-08-04 DIAGNOSIS — Z881 Allergy status to other antibiotic agents status: Secondary | ICD-10-CM

## 2020-08-04 HISTORY — DX: Poisoning by unspecified drugs, medicaments and biological substances, intentional self-harm, initial encounter: T50.902A

## 2020-08-04 LAB — URINALYSIS, ROUTINE W REFLEX MICROSCOPIC
Bilirubin Urine: NEGATIVE
Glucose, UA: NEGATIVE mg/dL
Hgb urine dipstick: NEGATIVE
Ketones, ur: NEGATIVE mg/dL
Leukocytes,Ua: NEGATIVE
Nitrite: NEGATIVE
Protein, ur: NEGATIVE mg/dL
Specific Gravity, Urine: 1.008 (ref 1.005–1.030)
pH: 6 (ref 5.0–8.0)

## 2020-08-04 LAB — RAPID URINE DRUG SCREEN, HOSP PERFORMED
Amphetamines: NOT DETECTED
Barbiturates: NOT DETECTED
Benzodiazepines: POSITIVE — AB
Cocaine: POSITIVE — AB
Opiates: NOT DETECTED
Tetrahydrocannabinol: NOT DETECTED

## 2020-08-04 LAB — I-STAT ARTERIAL BLOOD GAS, ED
Acid-base deficit: 3 mmol/L — ABNORMAL HIGH (ref 0.0–2.0)
Bicarbonate: 24.1 mmol/L (ref 20.0–28.0)
Calcium, Ion: 1.25 mmol/L (ref 1.15–1.40)
HCT: 44 % (ref 36.0–46.0)
Hemoglobin: 15 g/dL (ref 12.0–15.0)
O2 Saturation: 98 %
Patient temperature: 93
Potassium: 3.5 mmol/L (ref 3.5–5.1)
Sodium: 141 mmol/L (ref 135–145)
TCO2: 25 mmol/L (ref 22–32)
pCO2 arterial: 41.4 mmHg (ref 32.0–48.0)
pH, Arterial: 7.357 (ref 7.350–7.450)
pO2, Arterial: 95 mmHg (ref 83.0–108.0)

## 2020-08-04 LAB — COMPREHENSIVE METABOLIC PANEL
ALT: 52 U/L — ABNORMAL HIGH (ref 0–44)
AST: 48 U/L — ABNORMAL HIGH (ref 15–41)
Albumin: 4.4 g/dL (ref 3.5–5.0)
Alkaline Phosphatase: 119 U/L (ref 38–126)
Anion gap: 18 — ABNORMAL HIGH (ref 5–15)
BUN: 12 mg/dL (ref 6–20)
CO2: 19 mmol/L — ABNORMAL LOW (ref 22–32)
Calcium: 9.9 mg/dL (ref 8.9–10.3)
Chloride: 102 mmol/L (ref 98–111)
Creatinine, Ser: 1.18 mg/dL — ABNORMAL HIGH (ref 0.44–1.00)
GFR, Estimated: 57 mL/min — ABNORMAL LOW (ref >60)
Glucose, Bld: 187 mg/dL — ABNORMAL HIGH (ref 70–99)
Potassium: 2.7 mmol/L — CL (ref 3.5–5.1)
Sodium: 139 mmol/L (ref 135–145)
Total Bilirubin: 0.8 mg/dL (ref 0.3–1.2)
Total Protein: 7.9 g/dL (ref 6.5–8.1)

## 2020-08-04 LAB — ACETAMINOPHEN LEVEL: Acetaminophen (Tylenol), Serum: <10 ug/mL — ABNORMAL LOW (ref 10–30)

## 2020-08-04 LAB — CBC WITH DIFFERENTIAL/PLATELET
Abs Immature Granulocytes: 0 10*3/uL (ref 0.00–0.07)
Basophils Absolute: 0 10*3/uL (ref 0.0–0.1)
Basophils Relative: 0 %
Eosinophils Absolute: 0.2 10*3/uL (ref 0.0–0.5)
Eosinophils Relative: 2 %
HCT: 46.1 % — ABNORMAL HIGH (ref 36.0–46.0)
Hemoglobin: 14.9 g/dL (ref 12.0–15.0)
Lymphocytes Relative: 57 %
Lymphs Abs: 6.3 10*3/uL — ABNORMAL HIGH (ref 0.7–4.0)
MCH: 31 pg (ref 26.0–34.0)
MCHC: 32.3 g/dL (ref 30.0–36.0)
MCV: 95.8 fL (ref 80.0–100.0)
Monocytes Absolute: 1.1 10*3/uL — ABNORMAL HIGH (ref 0.1–1.0)
Monocytes Relative: 10 %
Neutro Abs: 3.4 10*3/uL (ref 1.7–7.7)
Neutrophils Relative %: 31 %
Platelets: 391 10*3/uL (ref 150–400)
RBC: 4.81 MIL/uL (ref 3.87–5.11)
RDW: 14.3 % (ref 11.5–15.5)
WBC: 11 10*3/uL — ABNORMAL HIGH (ref 4.0–10.5)
nRBC: 0 % (ref 0.0–0.2)
nRBC: 0 /100 WBC

## 2020-08-04 LAB — I-STAT CHEM 8, ED
BUN: 16 mg/dL (ref 6–20)
Calcium, Ion: 1.16 mmol/L (ref 1.15–1.40)
Chloride: 105 mmol/L (ref 98–111)
Creatinine, Ser: 0.9 mg/dL (ref 0.44–1.00)
Glucose, Bld: 182 mg/dL — ABNORMAL HIGH (ref 70–99)
HCT: 47 % — ABNORMAL HIGH (ref 36.0–46.0)
Hemoglobin: 16 g/dL — ABNORMAL HIGH (ref 12.0–15.0)
Potassium: 2.7 mmol/L — CL (ref 3.5–5.1)
Sodium: 141 mmol/L (ref 135–145)
TCO2: 22 mmol/L (ref 22–32)

## 2020-08-04 LAB — I-STAT BETA HCG BLOOD, ED (MC, WL, AP ONLY): I-stat hCG, quantitative: <5 m[IU]/mL (ref <5)

## 2020-08-04 LAB — MAGNESIUM: Magnesium: 2.6 mg/dL — ABNORMAL HIGH (ref 1.7–2.4)

## 2020-08-04 LAB — ETHANOL: Alcohol, Ethyl (B): <10 mg/dL (ref <10)

## 2020-08-04 LAB — SALICYLATE LEVEL: Salicylate Lvl: <7 mg/dL — ABNORMAL LOW (ref 7.0–30.0)

## 2020-08-04 LAB — RESPIRATORY PANEL BY RT PCR (FLU A&B, COVID)
Influenza A by PCR: NEGATIVE
Influenza B by PCR: NEGATIVE
SARS Coronavirus 2 by RT PCR: NEGATIVE

## 2020-08-04 LAB — TRIGLYCERIDES: Triglycerides: 151 mg/dL — ABNORMAL HIGH (ref <150)

## 2020-08-04 MED ORDER — FENTANYL CITRATE (PF) 100 MCG/2ML IJ SOLN
100.0000 ug | INTRAMUSCULAR | Status: DC | PRN
  Administered 2020-08-04: 100 ug via INTRAVENOUS

## 2020-08-04 MED ORDER — HEPARIN SODIUM (PORCINE) 5000 UNIT/ML IJ SOLN
5000.0000 [IU] | Freq: Three times a day (TID) | INTRAMUSCULAR | Status: DC
  Administered 2020-08-05 – 2020-08-06 (×4): 5000 [IU] via SUBCUTANEOUS
  Filled 2020-08-04 (×4): qty 1

## 2020-08-04 MED ORDER — SODIUM CHLORIDE 0.9 % IV SOLN: INTRAVENOUS | Status: DC

## 2020-08-04 MED ORDER — DOPAMINE-DEXTROSE 3.2-5 MG/ML-% IV SOLN
INTRAVENOUS | Status: AC
  Filled 2020-08-04: qty 250

## 2020-08-04 MED ORDER — SUCCINYLCHOLINE CHLORIDE 20 MG/ML IJ SOLN
INTRAMUSCULAR | Status: AC | PRN
  Administered 2020-08-04: 100 mg via INTRAVENOUS

## 2020-08-04 MED ORDER — ONDANSETRON HCL 4 MG/2ML IJ SOLN
INTRAMUSCULAR | Status: AC
  Filled 2020-08-04: qty 2

## 2020-08-04 MED ORDER — POLYETHYLENE GLYCOL 3350 17 G PO PACK: 17.0000 g | PACK | Freq: Every day | ORAL | Status: DC

## 2020-08-04 MED ORDER — DOCUSATE SODIUM 100 MG PO CAPS: 100.0000 mg | ORAL_CAPSULE | Freq: Two times a day (BID) | ORAL | Status: DC | PRN

## 2020-08-04 MED ORDER — SODIUM CHLORIDE 0.9 % IV BOLUS
1000.0000 mL | Freq: Once | INTRAVENOUS | Status: AC
  Administered 2020-08-04: 1000 mL via INTRAVENOUS

## 2020-08-04 MED ORDER — LACTATED RINGERS IV SOLN: INTRAVENOUS | Status: DC

## 2020-08-04 MED ORDER — DOPAMINE-DEXTROSE 3.2-5 MG/ML-% IV SOLN
0.0000 ug/kg/min | INTRAVENOUS | Status: DC
  Administered 2020-08-04: 5 ug/kg/min via INTRAVENOUS
  Administered 2020-08-06: 2 ug/kg/min via INTRAVENOUS
  Filled 2020-08-04: qty 250

## 2020-08-04 MED ORDER — ETOMIDATE 2 MG/ML IV SOLN
INTRAVENOUS | Status: AC | PRN
  Administered 2020-08-04: 20 mg via INTRAVENOUS

## 2020-08-04 MED ORDER — ONDANSETRON HCL 4 MG/2ML IJ SOLN
4.0000 mg | Freq: Once | INTRAMUSCULAR | Status: AC
  Administered 2020-08-04: 4 mg via INTRAVENOUS

## 2020-08-04 MED ORDER — FENTANYL CITRATE (PF) 100 MCG/2ML IJ SOLN
INTRAMUSCULAR | Status: AC
  Filled 2020-08-04: qty 2

## 2020-08-04 MED ORDER — PANTOPRAZOLE SODIUM 40 MG IV SOLR
40.0000 mg | Freq: Every day | INTRAVENOUS | Status: DC
  Administered 2020-08-05 – 2020-08-06 (×3): 40 mg via INTRAVENOUS
  Filled 2020-08-04 (×3): qty 40

## 2020-08-04 MED ORDER — POLYETHYLENE GLYCOL 3350 17 G PO PACK: 17.0000 g | PACK | Freq: Every day | ORAL | Status: DC | PRN

## 2020-08-04 MED ORDER — POTASSIUM CHLORIDE 10 MEQ/100ML IV SOLN
10.0000 meq | Freq: Once | INTRAVENOUS | Status: AC
  Administered 2020-08-04: 10 meq via INTRAVENOUS
  Filled 2020-08-04: qty 100

## 2020-08-04 MED ORDER — FENTANYL CITRATE (PF) 100 MCG/2ML IJ SOLN
50.0000 ug | INTRAMUSCULAR | Status: DC | PRN
  Administered 2020-08-05 (×2): 100 ug via INTRAVENOUS

## 2020-08-04 MED ORDER — PROPOFOL 1000 MG/100ML IV EMUL
0.0000 ug/kg/min | INTRAVENOUS | Status: DC
  Administered 2020-08-04: 5 ug/kg/min via INTRAVENOUS
  Administered 2020-08-05 (×2): 30 ug/kg/min via INTRAVENOUS
  Administered 2020-08-05: 50 ug/kg/min via INTRAVENOUS
  Administered 2020-08-06 (×2): 40 ug/kg/min via INTRAVENOUS
  Filled 2020-08-04 (×6): qty 100

## 2020-08-04 MED ORDER — PROPOFOL 1000 MG/100ML IV EMUL
INTRAVENOUS | Status: AC
  Filled 2020-08-04: qty 100

## 2020-08-04 MED ORDER — FENTANYL CITRATE (PF) 100 MCG/2ML IJ SOLN
50.0000 ug | INTRAMUSCULAR | Status: DC | PRN
  Administered 2020-08-05 (×2): 50 ug via INTRAVENOUS
  Filled 2020-08-04: qty 2

## 2020-08-04 MED ORDER — DOCUSATE SODIUM 50 MG/5ML PO LIQD
100.0000 mg | Freq: Two times a day (BID) | ORAL | Status: DC
  Filled 2020-08-04: qty 10

## 2020-08-04 NOTE — H&P (Signed)
NAME:  Margaret Mccann, MRN:  [id number], DOB:  05-Dec-1970, LOS: 0 ADMISSION DATE:  08/04/2020, CONSULTATION DATE:  11/10 REFERRING MD:  Dr. Particia Nearing EDP, CHIEF COMPLAINT:  Intentional overdose.    Brief History   49 year old female found in bed with empty bottles of clonazepam and clonidine. Intubated in ED for airway protection and started on dopamine for bradycardia. PCCM asked to admit.   History of present illness   Patient is encephalopathic and/or intubated. Therefore history has been obtained from chart review.   49 year old female with PMH as bel,wo which is significant for bipolar disorder, depression, anxiety, and Morgellons syndrome. She is treated with multiple psych medications. She has been grief stricken since her boyfriend died by suicide approximately three weeks prior to admission. She has been living with her mother since his death. On 2023/08/15 she filled scripts for clonidine (#30) and clonazepam (#60). On 11/10 her mother found her minimally responsive lying in bed with these bottles empty. EMS was called. Upon their arrival the patient's O2 sats were in the 40s with a heart rate of 28. EMS provided BVM ventilations and started epinephrine infusion. She was transdermally paced. Upon arrival to the ED her hemodynamics had somewhat stabilized, but her mental status still required intubation. Her condition was discussed with poison control and she was transitioned from epinephrine infusion to dopamine.   PCCM was asked to admit.  Past Medical History   has a past medical history of Anxiety, Bipolar 1 disorder (HCC), Depression, and Morgellons syndrome.  Significant Hospital Events     Consults:    Procedures:  ETT 11/10 > RIJ CVL 11/10 >  Significant Diagnostic Tests:    Micro Data:    Antimicrobials:     Interim history/subjective:    Objective   Blood pressure 124/72, pulse 71, temperature (!) 95.1 F (35.1 C), temperature source Temporal, resp.  rate (!) 25, height 5\' 3"  (1.6 m), weight 69 kg, SpO2 100 %.    Vent Mode: PRVC FiO2 (%):  [80 %] 80 % Set Rate:  [18 bmp] 18 bmp Vt Set:  [420 mL] 420 mL PEEP:  [5 cmH20] 5 cmH20 Plateau Pressure:  [14 cmH20] 14 cmH20  No intake or output data in the 24 hours ending 08/04/20 2155 Filed Weights   08/04/20 1934  Weight: 69 kg    Examination: General: Middle aged female in NAD on vent HENT: Rose/AT, PERRL, no JVD. ETT in place. BRB in OGT suction.  Lungs: Clear bilateral breath sounds Cardiovascular: RRR, no MRG Abdomen: Soft, non-distended, hypoactive.  Extremities: No acute deformity Neuro: Sedated, RASS -3.   Resolved Hospital Problem list     Assessment & Plan:   Intentional overdose of clonidine, clonazepam Cocaine abuse - chronic benzo use, was not reversed - dopamine infusion for bradycardia per poison control recommendation.  - IVF  Acute hypoxemic respiratory failure: secondary to toxic encephalopathy - Full vent support - Follow CXR, ABG - Daily WUA, SBT - VAP bundle  Hypokalemia - K supplementation ongoing - Follow BMP  Bipolar disorder Depression Anxiety - Holding home clonidine, clonazepam, gabapentin, fluoxetine, mirtazapine.   Best practice:  Diet: NPO Pain/Anxiety/Delirium protocol (if indicated): Propofol VAP protocol (if indicated): Per protocol DVT prophylaxis: SQH GI prophylaxis: PPI Glucose control: SSI Mobility: BR Code Status: FULL Family Communication: Mother updated Disposition: ICU  Labs   CBC: Recent Labs  Lab 08/04/20 1936 08/04/20 1937 08/04/20 2126  WBC 11.0*  --   --  NEUTROABS 3.4  --   --   HGB 14.9 16.0* 15.0  HCT 46.1* 47.0* 44.0  MCV 95.8  --   --   PLT 391  --   --     Basic Metabolic Panel: Recent Labs  Lab 08/04/20 1936 08/04/20 1937 08/04/20 1945 08/04/20 2126  NA 139 141  --  141  K 2.7* 2.7*  --  3.5  CL 102 105  --   --   CO2 19*  --   --   --   GLUCOSE 187* 182*  --   --   BUN 12 16  --    --   CREATININE 1.18* 0.90  --   --   CALCIUM 9.9  --   --   --   MG  --   --  2.6*  --    GFR: Estimated Creatinine Clearance: 72 mL/min (by C-G formula based on SCr of 0.9 mg/dL). Recent Labs  Lab 08/04/20 1936  WBC 11.0*    Liver Function Tests: Recent Labs  Lab 08/04/20 1936  AST 48*  ALT 52*  ALKPHOS 119  BILITOT 0.8  PROT 7.9  ALBUMIN 4.4   No results for input(s): LIPASE, AMYLASE in the last 168 hours. No results for input(s): AMMONIA in the last 168 hours.  ABG    Component Value Date/Time   PHART 7.357 08/04/2020 2126   PCO2ART 41.4 08/04/2020 2126   PO2ART 95 08/04/2020 2126   HCO3 24.1 08/04/2020 2126   TCO2 25 08/04/2020 2126   ACIDBASEDEF 3.0 (H) 08/04/2020 2126   O2SAT 98.0 08/04/2020 2126     Coagulation Profile: No results for input(s): INR, PROTIME in the last 168 hours.  Cardiac Enzymes: No results for input(s): CKTOTAL, CKMB, CKMBINDEX, TROPONINI in the last 168 hours.  HbA1C: No results found for: HGBA1C  CBG: No results for input(s): GLUCAP in the last 168 hours.  Review of Systems:   Patient is encephalopathic and/or intubated. Therefore history has been obtained from chart review.    Past Medical History  She,  has a past medical history of Anxiety, Bipolar 1 disorder (HCC), Depression, and Morgellons syndrome.   Surgical History    Past Surgical History:  Procedure Laterality Date  . APPENDECTOMY       Social History   reports that she has quit smoking. Her smoking use included cigarettes. She smoked 0.50 packs per day. She has never used smokeless tobacco. She reports current alcohol use. She reports previous drug use.   Family History   Her family history includes Breast cancer in her paternal aunt; Heart attack (age of onset: 33) in her father; Stroke in her father. There is no history of Ulcerative colitis or Esophageal cancer.   Allergies Allergies  Allergen Reactions  . Lamictal [Lamotrigine] Anaphylaxis and Rash     Rash, fever,  Trudie Buckler Syndrom  . Sulfamethoxazole-Trimethoprim Rash  . Erythromycin Nausea And Vomiting  . Lamictal [Lamotrigine] Other (See Comments)    Viviann Spare Johnson's Syndrom  . Tramadol Other (See Comments)    Pt had a seizure after taking tramadol  . Levetiracetam Anxiety     Home Medications  Prior to Admission medications   Medication Sig Start Date End Date Taking? Authorizing Provider  clonazePAM (KLONOPIN) 0.5 MG tablet Take 1 tablet (0.5 mg total) by mouth 2 (two) times daily as needed for anxiety. 07/30/20   Zena Amos, MD  cloNIDine (CATAPRES) 0.2 MG tablet Take 1 tablet (0.2 mg total)  by mouth at bedtime. 07/30/20   Zena Amos, MD  FLUoxetine (PROZAC) 40 MG capsule Take 1 capsule (40 mg total) by mouth daily. 07/30/20   Zena Amos, MD  gabapentin (NEURONTIN) 600 MG tablet Take 2 tablets (1,200 mg total) by mouth 2 (two) times daily. 07/30/20   Zena Amos, MD  mirtazapine (REMERON) 30 MG tablet Take 1 tablet (30 mg total) by mouth at bedtime. 07/30/20   Zena Amos, MD     Critical care time: 46 minutes     Joneen Roach, AGACNP-BC [facility]  See Amion for personal pager PCCM on call pager [phone]  08/04/2020 10:52 PM

## 2020-08-04 NOTE — ED Provider Notes (Signed)
Procedure Name: Intubation Date/Time: 08/04/2020 7:39 PM Performed by: Karrie Meres, PA-C Pre-anesthesia Checklist: Patient identified Oxygen Delivery Method: Non-rebreather mask Preoxygenation: Pre-oxygenation with 100% oxygen Induction Type: Rapid sequence Ventilation: Mask ventilation without difficulty Laryngoscope Size: Glidescope and 3 Tube size: 7.5 mm Number of attempts: 1 Airway Equipment and Method: Rigid stylet and Video-laryngoscopy Placement Confirmation: ETT inserted through vocal cords under direct vision,  Breath sounds checked- equal and bilateral and CO2 detector Secured at: 22 cm Tube secured with: ETT holder Dental Injury: Teeth and Oropharynx as per pre-operative assessment        Karrie Meres, PA-C 08/04/20 1944    Jacalyn Lefevre, MD 08/04/20 2157

## 2020-08-04 NOTE — ED Provider Notes (Addendum)
[facility] EMERGENCY DEPARTMENT Provider Note   CSN: [id number] Arrival date & time: 08/04/20  1920     History Chief Complaint  Patient presents with  . Drug Overdose    Margaret Mccann is a 49 y.o. female.  Pt presents to the ED today with a drug overdose.  Pt's boyfriend hung himself about 3 weeks ago.  She found him.  She has been living with her mother since then.  Pt took a bath and was in there for several hours when the mother checked on her and found her in cold water not responding much.  She and her husband were able to get patient to wake enough to get out of the tub and in bed.  She then called EMS.  When EMS arrived, her O2 sats were in the 40s and HR was 28.  They put her on a NRB and started an epi drip and started pacing her.  BP and HR improved.  Pt is unable to give any hx.  The pt has 2 pill bottles which were filled on 11/5.  They are clonidine (#30) and clonazepam (#60).  Pt has several other pill bottles with mixed meds in them.  Pt is unable to give any hx.         Past Medical History:  Diagnosis Date  . Anxiety   . Bipolar 1 disorder (HCC)   . Depression   . Morgellons syndrome     Patient Active Problem List   Diagnosis Date Noted  . Intentional benzodiazepine overdose (HCC) 08/04/2020  . MDD (major depressive disorder), recurrent episode, moderate (HCC) 07/30/2020  . Grief 07/30/2020  . PTSD (post-traumatic stress disorder) 07/30/2020  . MDD (major depressive disorder), recurrent episode, severe (HCC) 06/04/2018  . Schizophrenia (HCC) 06/04/2018  . Suicide attempt (HCC)   . Seizure (HCC) 06/02/2018  . Chest pain 07/13/2015  . Palpitations 07/13/2015  . Cocaine abuse (HCC) 06/03/2012  . 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 APPENDECTOMY       OB History   No  obstetric history on file.     Family History  Problem Relation Age of Onset  . Heart attack Father 60  . Stroke Father   . Breast cancer Paternal Aunt   . Ulcerative colitis Neg Hx   . Esophageal cancer Neg Hx     Social History   Tobacco Use  . Smoking status: Former Smoker    Packs/day: 0.50    Types: Cigarettes  . Smokeless tobacco: Never Used  Vaping Use  . Vaping Use: Never used  Substance Use Topics  . Alcohol use: Yes    Comment: occas  . Drug use: Not Currently    Comment: not currently-was +cocaine and amphetamines, clean x3 years    Home Medications Prior to Admission medications   Medication Sig Start Date End Date Taking? Authorizing Provider  clonazePAM (KLONOPIN) 0.5 MG tablet Take 1 tablet (0.5 mg total) by mouth 2 (two) times daily as needed for anxiety. 07/30/20   Zena Amos, MD  cloNIDine (CATAPRES) 0.2 MG tablet Take 1 tablet (0.2 mg total) by mouth at bedtime. 07/30/20   Zena Amos, MD  FLUoxetine (PROZAC) 40 MG capsule Take 1 capsule (40 mg total) by mouth daily. 07/30/20   Zena Amos, MD  gabapentin (NEURONTIN) 600 MG tablet Take 2 tablets (1,200 mg total) by mouth  2 (two) times daily. 07/30/20   Zena AmosKaur, Mandeep, MD  mirtazapine (REMERON) 30 MG tablet Take 1 tablet (30 mg total) by mouth at bedtime. 07/30/20   Zena AmosKaur, Mandeep, MD    Allergies    Lamictal [lamotrigine], Sulfamethoxazole-trimethoprim, Erythromycin, Lamictal [lamotrigine], Tramadol, and Levetiracetam  Review of Systems   Review of Systems  Unable to perform ROS: Mental status change    Physical Exam Updated Vital Signs BP 123/79   Pulse 87   Temp (!) 93.2 F (34 C)   Resp 18   Ht 5\' 3"  (1.6 m)   Wt 69 kg   SpO2 98%   BMI 26.95 kg/m   Physical Exam Vitals and nursing note reviewed.  Constitutional:      General: She is in acute distress.     Appearance: She is ill-appearing.  HENT:     Head: Normocephalic and atraumatic.     Right Ear: External ear normal.      Left Ear: External ear normal.     Mouth/Throat:     Mouth: Mucous membranes are dry.  Eyes:     Extraocular Movements: Extraocular movements intact.     Conjunctiva/sclera: Conjunctivae normal.     Pupils: Pupils are equal, round, and reactive to light.  Cardiovascular:     Rate and Rhythm: Normal rate and regular rhythm.     Pulses: Normal pulses.     Heart sounds: Normal heart sounds.  Pulmonary:     Effort: Pulmonary effort is normal.     Breath sounds: Normal breath sounds.  Abdominal:     General: Abdomen is flat. Bowel sounds are normal.     Palpations: Abdomen is soft.  Musculoskeletal:        General: Normal range of motion.     Cervical back: Normal range of motion and neck supple.  Skin:    General: Skin is warm.     Capillary Refill: Capillary refill takes less than 2 seconds.  Neurological:     General: No focal deficit present.     Comments: Initially pt is awake enough to ask for her medicines.  She is moving all 4 extremities.  She then falls back asleep and becomes non-responsive.  Psychiatric:        Mood and Affect: Mood normal.        Behavior: Behavior normal.     ED Results / Procedures / Treatments   Labs (all labs ordered are listed, but only abnormal results are displayed) Labs Reviewed  COMPREHENSIVE METABOLIC PANEL - Abnormal; Notable for the following components:      Result Value   Potassium 2.7 (*)    CO2 19 (*)    Glucose, Bld 187 (*)    Creatinine, Ser 1.18 (*)    AST 48 (*)    ALT 52 (*)    GFR, Estimated 57 (*)    Anion gap 18 (*)    All other components within normal limits  SALICYLATE LEVEL - Abnormal; Notable for the following components:   Salicylate Lvl <7.0 (*)    All other components within normal limits  ACETAMINOPHEN LEVEL - Abnormal; Notable for the following components:   Acetaminophen (Tylenol), Serum <10 (*)    All other components within normal limits  RAPID URINE DRUG SCREEN, HOSP PERFORMED - Abnormal; Notable for  the following components:   Cocaine POSITIVE (*)    Benzodiazepines POSITIVE (*)    All other components within normal limits  CBC WITH DIFFERENTIAL/PLATELET - Abnormal; Notable  for the following components:   WBC 11.0 (*)    HCT 46.1 (*)    Lymphs Abs 6.3 (*)    Monocytes Absolute 1.1 (*)    All other components within normal limits  URINALYSIS, ROUTINE W REFLEX MICROSCOPIC - Abnormal; Notable for the following components:   Color, Urine STRAW (*)    All other components within normal limits  TRIGLYCERIDES - Abnormal; Notable for the following components:   Triglycerides 151 (*)    All other components within normal limits  MAGNESIUM - Abnormal; Notable for the following components:   Magnesium 2.6 (*)    All other components within normal limits  I-STAT CHEM 8, ED - Abnormal; Notable for the following components:   Potassium 2.7 (*)    Glucose, Bld 182 (*)    Hemoglobin 16.0 (*)    HCT 47.0 (*)    All other components within normal limits  I-STAT ARTERIAL BLOOD GAS, ED - Abnormal; Notable for the following components:   Acid-base deficit 3.0 (*)    All other components within normal limits  RESPIRATORY PANEL BY RT PCR (FLU A&B, COVID)  ETHANOL  BLOOD GAS, ARTERIAL  HIV ANTIBODY (ROUTINE TESTING W REFLEX)  CBC  BASIC METABOLIC PANEL  MAGNESIUM  PHOSPHORUS  I-STAT BETA HCG BLOOD, ED ([facility], [facility], [facility] ONLY)  CBG MONITORING, ED    EKG EKG Interpretation  Date/Time:  Wednesday August 04 2020 19:48:29 EST Ventricular Rate:  52 PR Interval:    QRS Duration: 107 QT Interval:  516 QTC Calculation: 480 R Axis:   1 Text Interpretation: Sinus rhythm Abnormal R-wave progression, early transition Since last tracing rate slower Confirmed by Jacalyn Lefevre [phone]) on 08/04/2020 9:08:08 PM   Radiology DG Chest Portable 1 View  Result Date: 08/04/2020 CLINICAL DATA:  Intubation EXAM: PORTABLE CHEST 1 VIEW COMPARISON:  11/17/2019 FINDINGS: Support Apparatus: --Endotracheal tube:  Tip at the level of the clavicular heads. --Enteric tube:Tip and sideport are below the field of view. --Catheter(s):Right internal jugular vein approach central venous catheter tip is in the proximal right atrium --Other: None The heart size and mediastinal contours are within normal limits. The lungs are clear. No pleural effusion or pneumothorax. IMPRESSION: 1. Endotracheal tube tip at the level of the clavicular heads. 2. Right internal jugular vein approach central venous catheter tip in the proximal right atrium. Electronically Signed   By: Deatra Robinson M.D.   On: 08/04/2020 20:30    Procedures .Central Line  Date/Time: 08/04/2020 9:07 PM Performed by: Jacalyn Lefevre, MD Authorized by: Jacalyn Lefevre, MD   Consent:    Consent obtained:  Emergent situation Pre-procedure details:    Hand hygiene: Hand hygiene performed prior to insertion     Sterile barrier technique: All elements of maximal sterile technique followed     Skin preparation:  2% chlorhexidine   Skin preparation agent: Skin preparation agent completely dried prior to procedure   Anesthesia (see MAR for exact dosages):    Anesthesia method:  None Procedure details:    Location:  R internal jugular   Patient position:  Flat   Procedural supplies:  Triple lumen   Landmarks identified: yes     Ultrasound guidance: yes     Sterile ultrasound techniques: Sterile gel and sterile probe covers were used     Number of attempts:  1   Successful placement: yes   Post-procedure details:    Post-procedure:  Dressing applied and line sutured   Assessment:  Blood return through all  ports, no pneumothorax on x-ray, free fluid flow and placement verified by x-ray   Patient tolerance of procedure:  Tolerated well, no immediate complications   (including critical care time)  Medications Ordered in ED Medications  sodium chloride 0.9 % bolus 1,000 mL (1,000 mLs Intravenous New Bag/Given 08/04/20 2148)    And  0.9 %  sodium  chloride infusion (has no administration in time range)  propofol (DIPRIVAN) 1000 MG/100ML infusion (5 mcg/kg/min  69 kg Intravenous New Bag/Given 08/04/20 2034)  propofol (DIPRIVAN) 1000 MG/100ML infusion (has no administration in time range)  fentaNYL (SUBLIMAZE) injection 100 mcg (100 mcg Intravenous Given 08/04/20 2021)  fentaNYL (SUBLIMAZE) injection 100 mcg (100 mcg Intravenous Given 08/04/20 1943)  fentaNYL (SUBLIMAZE) 100 MCG/2ML injection (has no administration in time range)  ondansetron (ZOFRAN) 4 MG/2ML injection (has no administration in time range)  DOPamine (INTROPIN) 800 mg in dextrose 5 % 250 mL (3.2 mg/mL) infusion (10 mcg/kg/min  69 kg Intravenous Rate/Dose Change 08/04/20 2037)  DOPamine (INTROPIN) 3.2-5 MG/ML-% infusion (has no administration in time range)  fentaNYL (SUBLIMAZE) 100 MCG/2ML injection (has no administration in time range)  potassium chloride 10 mEq in 100 mL IVPB (10 mEq Intravenous New Bag/Given 08/04/20 2150)  docusate sodium (COLACE) capsule 100 mg (has no administration in time range)  polyethylene glycol (MIRALAX / GLYCOLAX) packet 17 g (has no administration in time range)  heparin injection 5,000 Units (has no administration in time range)  lactated ringers infusion (has no administration in time range)  pantoprazole (PROTONIX) injection 40 mg (has no administration in time range)  etomidate (AMIDATE) injection (20 mg Intravenous Given 08/04/20 1932)  succinylcholine (ANECTINE) injection (100 mg Intravenous Given 08/04/20 1933)  ondansetron (ZOFRAN) injection 4 mg (4 mg Intravenous Given 08/04/20 1949)    ED Course  I have reviewed the triage vital signs and the nursing notes.  Pertinent labs & imaging results that were available during my care of the patient were reviewed by me and considered in my medical decision making (see chart for details).    MDM Rules/Calculators/A&P                          Pt taken off epi drip and TD pacer.  HR  initially ok, but then drops to the upper 40s.  After intubation, HR did not improve.  Pt d/w poison control.  They recommend dopamine to help with HR and bp.    Right TLC placed and dopamine started through that.  Pt also given IVFs.  HR and BP improved.  K is 2.7, so she is given additional K.  Pt d/w the intensivist for admission.  CRITICAL CARE Performed by: Jacalyn Lefevre   Total critical care time: 60 minutes  Critical care time was exclusive of separately billable procedures and treating other patients.  Critical care was necessary to treat or prevent imminent or life-threatening deterioration.  Critical care was time spent personally by me on the following activities: development of treatment plan with patient and/or surrogate as well as nursing, discussions with consultants, evaluation of patient's response to treatment, examination of patient, obtaining history from patient or surrogate, ordering and performing treatments and interventions, ordering and review of laboratory studies, ordering and review of radiographic studies, pulse oximetry and re-evaluation of patient's condition. Final Clinical Impression(s) / ED Diagnoses Final diagnoses:  Intentional drug overdose, initial encounter (HCC)  Hypokalemia  Acute respiratory failure with hypoxia (HCC)  Polysubstance abuse (HCC)  Rx / DC Orders ED Discharge Orders    None       Jacalyn Lefevre, MD 08/04/20 2227    Jacalyn Lefevre, MD 08/04/20 2249

## 2020-08-04 NOTE — ED Notes (Signed)
OG irrigated with iced sterile water per Critical Care MD vo

## 2020-08-04 NOTE — ED Triage Notes (Signed)
Pt arrived by ems for intentional overdose. Found in her bed by mother with empty bottles of clonidine (qty 30) and clonazepam (qty 60) both filled on 07/30/2020. Initial pulse 28, pt cyanotic, responsive to pain only, and unable to obtain bp. EMS had 2 failed PIVs, IO placed to L tibia. Pt placed on pacer at 60bpm, NRB in place, started on epi gtt, given 5mg  versed for pacing, and lidocaine given for IO insertion. On arrival to ED, pt continued to be paced, intermittently wakes up, demanding her medications, then becomes lethargic again. EDP at bedside; preparing for intubation

## 2020-08-05 ENCOUNTER — Inpatient Hospital Stay (HOSPITAL_COMMUNITY): Payer: Self-pay

## 2020-08-05 LAB — CBC
HCT: 43.3 % (ref 36.0–46.0)
Hemoglobin: 14.1 g/dL (ref 12.0–15.0)
MCH: 31.3 pg (ref 26.0–34.0)
MCHC: 32.6 g/dL (ref 30.0–36.0)
MCV: 96 fL (ref 80.0–100.0)
Platelets: 271 10*3/uL (ref 150–400)
RBC: 4.51 MIL/uL (ref 3.87–5.11)
RDW: 14.2 % (ref 11.5–15.5)
WBC: 13.7 10*3/uL — ABNORMAL HIGH (ref 4.0–10.5)
nRBC: 0 % (ref 0.0–0.2)

## 2020-08-05 LAB — MAGNESIUM: Magnesium: 1.9 mg/dL (ref 1.7–2.4)

## 2020-08-05 LAB — BASIC METABOLIC PANEL
Anion gap: 12 (ref 5–15)
BUN: 9 mg/dL (ref 6–20)
CO2: 19 mmol/L — ABNORMAL LOW (ref 22–32)
Calcium: 8.9 mg/dL (ref 8.9–10.3)
Chloride: 108 mmol/L (ref 98–111)
Creatinine, Ser: 0.71 mg/dL (ref 0.44–1.00)
GFR, Estimated: >60 mL/min (ref >60)
Glucose, Bld: 103 mg/dL — ABNORMAL HIGH (ref 70–99)
Potassium: 3.9 mmol/L (ref 3.5–5.1)
Sodium: 139 mmol/L (ref 135–145)

## 2020-08-05 LAB — GLUCOSE, CAPILLARY
Glucose-Capillary: 102 mg/dL — ABNORMAL HIGH (ref 70–99)
Glucose-Capillary: 104 mg/dL — ABNORMAL HIGH (ref 70–99)
Glucose-Capillary: 114 mg/dL — ABNORMAL HIGH (ref 70–99)
Glucose-Capillary: 93 mg/dL (ref 70–99)
Glucose-Capillary: 96 mg/dL (ref 70–99)
Glucose-Capillary: 96 mg/dL (ref 70–99)

## 2020-08-05 LAB — MRSA PCR SCREENING: MRSA by PCR: NEGATIVE

## 2020-08-05 LAB — TRIGLYCERIDES: Triglycerides: 100 mg/dL (ref <150)

## 2020-08-05 LAB — HIV ANTIBODY (ROUTINE TESTING W REFLEX): HIV Screen 4th Generation wRfx: NONREACTIVE

## 2020-08-05 LAB — PHOSPHORUS: Phosphorus: 3.9 mg/dL (ref 2.5–4.6)

## 2020-08-05 MED ORDER — ORAL CARE MOUTH RINSE: 15.0000 mL | OROMUCOSAL | Status: DC

## 2020-08-05 MED ORDER — CHLORHEXIDINE GLUCONATE 0.12% ORAL RINSE (MEDLINE KIT)
15.0000 mL | Freq: Two times a day (BID) | OROMUCOSAL | Status: DC
  Administered 2020-08-05: 15 mL via OROMUCOSAL

## 2020-08-05 MED ORDER — FENTANYL 2500MCG IN NS 250ML (10MCG/ML) PREMIX INFUSION
0.0000 ug/h | INTRAVENOUS | Status: DC
  Administered 2020-08-05: 125 ug/h via INTRAVENOUS
  Administered 2020-08-05: 25 ug/h via INTRAVENOUS
  Filled 2020-08-05 (×2): qty 250

## 2020-08-05 MED ORDER — CHLORHEXIDINE GLUCONATE CLOTH 2 % EX PADS
6.0000 | MEDICATED_PAD | Freq: Every day | CUTANEOUS | Status: DC
  Administered 2020-08-05: 6 via TOPICAL

## 2020-08-05 MED ORDER — CHLORHEXIDINE GLUCONATE 0.12% ORAL RINSE (MEDLINE KIT)
15.0000 mL | Freq: Two times a day (BID) | OROMUCOSAL | Status: DC
  Administered 2020-08-05 – 2020-08-07 (×4): 15 mL via OROMUCOSAL

## 2020-08-05 MED ORDER — ORAL CARE MOUTH RINSE
15.0000 mL | OROMUCOSAL | Status: DC
  Administered 2020-08-05 – 2020-08-06 (×17): 15 mL via OROMUCOSAL

## 2020-08-05 MED ORDER — POLYETHYLENE GLYCOL 3350 17 G PO PACK: 17.0000 g | PACK | Freq: Every day | ORAL | Status: DC | PRN

## 2020-08-05 MED ORDER — DOCUSATE SODIUM 50 MG/5ML PO LIQD: 100.0000 mg | Freq: Two times a day (BID) | ORAL | Status: DC | PRN

## 2020-08-05 MED ORDER — FENTANYL CITRATE (PF) 100 MCG/2ML IJ SOLN
INTRAMUSCULAR | Status: AC
  Filled 2020-08-05: qty 2

## 2020-08-05 MED ORDER — POLYETHYLENE GLYCOL 3350 17 G PO PACK
17.0000 g | PACK | Freq: Every day | ORAL | Status: DC
  Administered 2020-08-05 – 2020-08-06 (×2): 17 g
  Filled 2020-08-05 (×2): qty 1

## 2020-08-05 MED ORDER — SODIUM CHLORIDE 0.9% FLUSH
10.0000 mL | Freq: Two times a day (BID) | INTRAVENOUS | Status: DC
  Administered 2020-08-05 – 2020-08-08 (×8): 10 mL
  Administered 2020-08-09: 3 mL

## 2020-08-05 MED ORDER — SODIUM CHLORIDE 0.9% FLUSH
10.0000 mL | INTRAVENOUS | Status: DC | PRN
  Administered 2020-08-05: 10 mL

## 2020-08-05 MED ORDER — DOCUSATE SODIUM 50 MG/5ML PO LIQD
100.0000 mg | Freq: Two times a day (BID) | ORAL | Status: DC
  Administered 2020-08-05 – 2020-08-06 (×3): 100 mg
  Filled 2020-08-05 (×4): qty 10

## 2020-08-05 NOTE — Progress Notes (Signed)
eLink Physician-Brief Progress Note Patient Name: Margaret Mccann DOB: 02-06-1971 MRN: [id number]   Date of Service  08/05/2020  HPI/Events of Note  Patient admitted through the ED with acute hypoxemic / hypercapnic respiratory  Failure, severe bradycardia, and hypotension secondary to intentional overdose of Clonidine + Clonazepam in a suicide attempt. She is intubated, on the ventilator, and on Dopamine due to symptomatic bradycardia.  eICU Interventions  New Patient Evaluation completed.        [name] 08/05/2020, 1:07 AM

## 2020-08-05 NOTE — TOC Initial Note (Addendum)
Transition of Care [facility]) - Initial/Assessment Note    Patient Details  Name: Margaret Mccann MRN: [id number] Date of Birth: [birth year]  [facility] of [facility]) CM/SW Contact:    Lockie Pares, RN Phone Number: 08/05/2020, 8:39 AM  Clinical Narrative:                 Patient admitted with intentional overdose of clonazopam, prescription psych medications. .They were filled on 11/5. The patients boyfriend committed suicide 3 weeks ago. She currently lives with her mother. She is intubated and on dopamine due to severe bradycardia. Poison control was called and recommendations were instituted. Patient is on sedation and is agitated at times. Sitter will be with patient once extubated.  She willl transfer to IP psychiatric treatment when medically cleared. CSW and CM will follow for needs  Expected Discharge Plan: Psychiatric Hospital Barriers to Discharge: Continued Medical Work up   Patient Goals and CMS Choice        Expected Discharge Plan and Services Expected Discharge Plan: Psychiatric Hospital In-house Referral: Clinical Social Work Discharge Planning Services: CM Consult   Living arrangements for the past 2 months: Single Family Home                                      Prior Living Arrangements/Services Living arrangements for the past 2 months: Single Family Home Lives with:: Parents Patient language and need for interpreter reviewed:: Yes        Need for Family Participation in Patient Care: Yes (Comment) Care giver support system in place?: Yes (comment)   Criminal Activity/Legal Involvement Pertinent to Current Situation/Hospitalization: No - Comment as needed  Activities of Daily Living      Permission Sought/Granted                  Emotional Assessment   Attitude/Demeanor/Rapport: Intubated (Following Commands or Not Following Commands) Affect (typically observed): Unable to Assess Orientation: : Fluctuating Orientation  (Suspected and/or reported Sundowners) (intubated unconcious) Alcohol / Substance Use:  (overdose prescription medicaitons) Psych Involvement: Yes (comment)  Admission diagnosis:  Hypokalemia [E87.6] Polysubstance abuse (HCC) [F19.10] Intentional benzodiazepine overdose (HCC) [T42.4X2A] Acute respiratory failure with hypoxia (HCC) [J96.01] Intentional drug overdose, initial encounter [facility]) [T50.902A] Patient Active Problem List   Diagnosis Date Noted  . Intentional drug overdose (HCC) 08/04/2020  . Hypokalemia   . Acute respiratory failure with hypoxia (HCC)   . Polysubstance abuse (HCC)   . MDD (major depressive disorder), recurrent episode, moderate (HCC) 07/30/2020  . Grief 07/30/2020  . PTSD (post-traumatic stress disorder) 07/30/2020  . MDD (major depressive disorder), recurrent episode, severe (HCC) 06/04/2018  . Schizophrenia (HCC) 06/04/2018  . Suicide attempt (HCC)   . Seizure (HCC) 06/02/2018  . Chest pain 07/13/2015  . Palpitations 07/13/2015  . Cocaine abuse (HCC) 06/03/2012  . 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 [place] [address] Phone: [phone] Fax: [phone]     Social Determinants of Health (SDOH) Interventions    Readmission Risk Interventions No flowsheet data found.

## 2020-08-05 NOTE — Progress Notes (Signed)
Patient transported from ED Trauma A to 2M16 with no complications.

## 2020-08-05 NOTE — Progress Notes (Signed)
NAME:  Margaret Mccann, MRN:  [id number], DOB:  [birth year], LOS: 1 ADMISSION DATE:  08/04/2020, CONSULTATION DATE:  11/10 REFERRING MD:  Dr. Particia Nearing EDP, CHIEF COMPLAINT:  Intentional overdose.    Brief History   49 year old female found in bed with empty bottles of clonazepam and clonidine. Intubated in ED for airway protection and started on dopamine for bradycardia. PCCM asked to admit.   History of present illness   Patient is encephalopathic and/or intubated. Therefore history has been obtained from chart review.   49 year old female with PMH as bel,wo which is significant for bipolar disorder, depression, anxiety, and Morgellons syndrome. She is treated with multiple psych medications. She has been grief stricken since her boyfriend died by suicide approximately three weeks prior to admission. She has been living with her mother since his death. On 08/02/2023 she filled scripts for clonidine (#30) and clonazepam (#60). On 11/10 her mother found her minimally responsive lying in bed with these bottles empty. EMS was called. Upon their arrival the patient's O2 sats were in the 40s with a heart rate of 28. EMS provided BVM ventilations and started epinephrine infusion. She was transdermally paced. Upon arrival to the ED her hemodynamics had somewhat stabilized, but her mental status still required intubation. Her condition was discussed with poison control and she was transitioned from epinephrine infusion to dopamine.   PCCM was asked to admit.  Past Medical History   has a past medical history of Anxiety, Bipolar 1 disorder (HCC), Depression, and Morgellons syndrome.  Significant Hospital Events     Consults:    Procedures:  ETT 11/10 > RIJ CVL 11/10 >  Significant Diagnostic Tests:    Micro Data:    Antimicrobials:     Interim history/subjective:  Intentional OD with clonidine and klonopin. Patient intubated in the ED. Poison control recommended Dopamine for  symptomatic bradycardia.   Objective   Blood pressure 113/77, pulse 72, temperature 97.6 F (36.4 C), temperature source Axillary, resp. rate 12, height 5\' 3"  (1.6 m), weight 70 kg, SpO2 100 %.    Vent Mode: PRVC FiO2 (%):  [40 %-80 %] 40 % Set Rate:  [18 bmp] 18 bmp Vt Set:  [420 mL] 420 mL PEEP:  [5 cmH20] 5 cmH20 Plateau Pressure:  [14 cmH20-15 cmH20] 14 cmH20   Intake/Output Summary (Last 24 hours) at 08/05/2020 0802 Last data filed at 08/05/2020 0600 Gross per 24 hour  Intake 419.45 ml  Output 470 ml  Net -50.55 ml   Filed Weights   08/04/20 1934 08/05/20 0150  Weight: 69 kg 70 kg    Examination: GEN:  disheveled appearing female on ventilator  CV: regular rate and rhythm, no murmurs appreciated RESP: mechanical ventilation ABD: Bowel sounds present. Soft, nondistended.  GU: external urinary catheter  MSK: no LE edema SKIN: warm, dry NEURO: sedated, does not awaken to movement or touch      Resolved Hospital Problem list     Assessment & Plan:   Toxic encephalopathy  Intentional overdose clonidine and clonazepam  Patient sedated (Propofol). On dobutamine for bradycardia. Uses Klonopin and Clonidine at home. No benzo reversal agents given.  Plan: Continue dopamine infusion for bradycardia per poison control recommendation.  LR at 100 ml/hr Consult psychiatry for inpatient [facility] after SA when medically stable Monitor TGs while on propofol   Acute hypoxemic respiratory failure  Patient required intubation in setting of toxic encephalopathy. Plan Full vent support Follow CXR, ABG Daily WUA, SBT VAP  per protocol   Bipolar disorder  MDD  Anxiety  Suicide Attempt  Grief  Pt's partner recently committed suicide about 3 weeks ago. SA as above.  Plan: Hold home clonidine, clonazepam, gabapentin, fluoxetine, mirtazapine.  Recommend tapering off Klonipin.   Polysubstance abuse Patient with hx of methamphetamine and cocaine abuse.  Plan Consult SW,  provide community resources    Hypokalemia Admission K 2.7 and today 3.9.  Plan Replete as needed  Follow BMP  Best practice:  Diet: NPO Pain/Anxiety/Delirium protocol (if indicated): Propofol VAP protocol (if indicated): Per protocol DVT prophylaxis: SQH GI prophylaxis: PPI Glucose control: SSI Mobility: Bedrest Code Status: FULL Family Communication: Mother updated Disposition: ICU  Labs   CBC: Recent Labs  Lab 08/04/20 1936 08/04/20 1937 08/04/20 2126 08/05/20 0344  WBC 11.0*  --   --  13.7*  NEUTROABS 3.4  --   --   --   HGB 14.9 16.0* 15.0 14.1  HCT 46.1* 47.0* 44.0 43.3  MCV 95.8  --   --  96.0  PLT 391  --   --  271    Basic Metabolic Panel: Recent Labs  Lab 08/04/20 1936 08/04/20 1937 08/04/20 1945 08/04/20 2126 08/05/20 0344  NA 139 141  --  141 139  K 2.7* 2.7*  --  3.5 3.9  CL 102 105  --   --  108  CO2 19*  --   --   --  19*  GLUCOSE 187* 182*  --   --  103*  BUN 12 16  --   --  9  CREATININE 1.18* 0.90  --   --  0.71  CALCIUM 9.9  --   --   --  8.9  MG  --   --  2.6*  --  1.9  PHOS  --   --   --   --  3.9   GFR: Estimated Creatinine Clearance: 81.5 mL/min (by C-G formula based on SCr of 0.71 mg/dL). Recent Labs  Lab 08/04/20 1936 08/05/20 0344  WBC 11.0* 13.7*    Liver Function Tests: Recent Labs  Lab 08/04/20 1936  AST 48*  ALT 52*  ALKPHOS 119  BILITOT 0.8  PROT 7.9  ALBUMIN 4.4   No results for input(s): LIPASE, AMYLASE in the last 168 hours. No results for input(s): AMMONIA in the last 168 hours.  ABG    Component Value Date/Time   PHART 7.357 08/04/2020 2126   PCO2ART 41.4 08/04/2020 2126   PO2ART 95 08/04/2020 2126   HCO3 24.1 08/04/2020 2126   TCO2 25 08/04/2020 2126   ACIDBASEDEF 3.0 (H) 08/04/2020 2126   O2SAT 98.0 08/04/2020 2126     Coagulation Profile: No results for input(s): INR, PROTIME in the last 168 hours.  Cardiac Enzymes: No results for input(s): CKTOTAL, CKMB, CKMBINDEX, TROPONINI in the  last 168 hours.  HbA1C: No results found for: HGBA1C  CBG: Recent Labs  Lab 08/05/20 0330  GLUCAP 93    Review of Systems:   Patient is encephalopathic and/or intubated. Therefore history has been obtained from chart review.    Past Medical History  She,  has a past medical history of Anxiety, Bipolar 1 disorder (HCC), Depression, and Morgellons syndrome.   Surgical History    Past Surgical History:  Procedure Laterality Date  . APPENDECTOMY       Social History   reports that she has quit smoking. Her smoking use included cigarettes. She smoked 0.50 packs per day. She has  never used smokeless tobacco. She reports current alcohol use. She reports previous drug use.   Family History   Her family history includes Breast cancer in her paternal aunt; Heart attack (age of onset: 53) in her father; Stroke in her father. There is no history of Ulcerative colitis or Esophageal cancer.   Allergies Allergies  Allergen Reactions  . Lamictal [Lamotrigine] Anaphylaxis and Rash    Rash, fever,  Trudie Buckler Syndrom  . Sulfamethoxazole-Trimethoprim Rash  . Erythromycin Nausea And Vomiting  . Lamictal [Lamotrigine] Other (See Comments)    Viviann Spare Johnson's Syndrom  . Tramadol Other (See Comments)    Pt had a seizure after taking tramadol  . Levetiracetam Anxiety     Home Medications  Prior to Admission medications   Medication Sig Start Date End Date Taking? Authorizing Provider  clonazePAM (KLONOPIN) 0.5 MG tablet Take 1 tablet (0.5 mg total) by mouth 2 (two) times daily as needed for anxiety. 07/30/20   Zena Amos, MD  cloNIDine (CATAPRES) 0.2 MG tablet Take 1 tablet (0.2 mg total) by mouth at bedtime. 07/30/20   Zena Amos, MD  FLUoxetine (PROZAC) 40 MG capsule Take 1 capsule (40 mg total) by mouth daily. 07/30/20   Zena Amos, MD  gabapentin (NEURONTIN) 600 MG tablet Take 2 tablets (1,200 mg total) by mouth 2 (two) times daily. 07/30/20   Zena Amos, MD  mirtazapine  (REMERON) 30 MG tablet Take 1 tablet (30 mg total) by mouth at bedtime. 07/30/20   Zena Amos, MD     Critical care time:      Katha Cabal, DO PGY-2, [facility] 08/05/2020 8:31 AM

## 2020-08-06 LAB — CBC
HCT: 37.6 % (ref 36.0–46.0)
Hemoglobin: 12.4 g/dL (ref 12.0–15.0)
MCH: 30.9 pg (ref 26.0–34.0)
MCHC: 33 g/dL (ref 30.0–36.0)
MCV: 93.8 fL (ref 80.0–100.0)
Platelets: 220 10*3/uL (ref 150–400)
RBC: 4.01 MIL/uL (ref 3.87–5.11)
RDW: 14.2 % (ref 11.5–15.5)
WBC: 7 10*3/uL (ref 4.0–10.5)
nRBC: 0 % (ref 0.0–0.2)

## 2020-08-06 LAB — BASIC METABOLIC PANEL
Anion gap: 11 (ref 5–15)
BUN: 6 mg/dL (ref 6–20)
CO2: 24 mmol/L (ref 22–32)
Calcium: 9.2 mg/dL (ref 8.9–10.3)
Chloride: 104 mmol/L (ref 98–111)
Creatinine, Ser: 0.9 mg/dL (ref 0.44–1.00)
GFR, Estimated: >60 mL/min (ref >60)
Glucose, Bld: 95 mg/dL (ref 70–99)
Potassium: 3.8 mmol/L (ref 3.5–5.1)
Sodium: 139 mmol/L (ref 135–145)

## 2020-08-06 LAB — GLUCOSE, CAPILLARY
Glucose-Capillary: 89 mg/dL (ref 70–99)
Glucose-Capillary: 79 mg/dL (ref 70–99)
Glucose-Capillary: 89 mg/dL (ref 70–99)
Glucose-Capillary: 76 mg/dL (ref 70–99)

## 2020-08-06 LAB — TRIGLYCERIDES: Triglycerides: 338 mg/dL — ABNORMAL HIGH (ref <150)

## 2020-08-06 MED ORDER — STERILE WATER FOR INJECTION IJ SOLN
INTRAMUSCULAR | Status: AC
  Filled 2020-08-06: qty 10

## 2020-08-06 MED ORDER — LORAZEPAM 2 MG/ML IJ SOLN: 2.0000 mg | Freq: Once | INTRAMUSCULAR | Status: AC

## 2020-08-06 MED ORDER — RACEPINEPHRINE HCL 2.25 % IN NEBU
INHALATION_SOLUTION | RESPIRATORY_TRACT | Status: AC
  Administered 2020-08-06: 0.5 mL
  Filled 2020-08-06: qty 0.5

## 2020-08-06 MED ORDER — LORAZEPAM 2 MG/ML IJ SOLN: 2.0000 mg | Freq: Once | INTRAMUSCULAR | Status: DC

## 2020-08-06 MED ORDER — CHLORHEXIDINE GLUCONATE CLOTH 2 % EX PADS
6.0000 | MEDICATED_PAD | Freq: Every day | CUTANEOUS | Status: DC
  Administered 2020-08-07 – 2020-08-11 (×4): 6 via TOPICAL

## 2020-08-06 MED ORDER — ZIPRASIDONE MESYLATE 20 MG IM SOLR
20.0000 mg | Freq: Three times a day (TID) | INTRAMUSCULAR | Status: DC | PRN
  Filled 2020-08-06 (×2): qty 20

## 2020-08-06 MED ORDER — FLUOXETINE HCL 20 MG PO CAPS
40.0000 mg | ORAL_CAPSULE | Freq: Every day | ORAL | Status: DC
  Administered 2020-08-06 – 2020-08-11 (×6): 40 mg via ORAL
  Filled 2020-08-06 (×3): qty 2
  Filled 2020-08-06: qty 4
  Filled 2020-08-06: qty 2
  Filled 2020-08-06: qty 4
  Filled 2020-08-06: qty 2

## 2020-08-06 MED ORDER — GABAPENTIN 300 MG PO CAPS
300.0000 mg | ORAL_CAPSULE | Freq: Two times a day (BID) | ORAL | Status: DC
  Administered 2020-08-06 – 2020-08-10 (×8): 300 mg via ORAL
  Filled 2020-08-06 (×8): qty 1

## 2020-08-06 MED ORDER — ENOXAPARIN SODIUM 40 MG/0.4ML ~~LOC~~ SOLN
40.0000 mg | SUBCUTANEOUS | Status: DC
  Administered 2020-08-06 – 2020-08-11 (×6): 40 mg via SUBCUTANEOUS
  Filled 2020-08-06 (×6): qty 0.4

## 2020-08-06 MED ORDER — MIRTAZAPINE 15 MG PO TABS
30.0000 mg | ORAL_TABLET | Freq: Every day | ORAL | Status: DC
  Administered 2020-08-06 – 2020-08-10 (×5): 30 mg via ORAL
  Filled 2020-08-06 (×4): qty 2

## 2020-08-06 MED ORDER — LORAZEPAM 2 MG/ML IJ SOLN
INTRAMUSCULAR | Status: AC
  Administered 2020-08-06: 2 mg
  Filled 2020-08-06: qty 1

## 2020-08-06 MED ORDER — ACETAMINOPHEN 325 MG PO TABS
650.0000 mg | ORAL_TABLET | Freq: Four times a day (QID) | ORAL | Status: DC | PRN
  Administered 2020-08-08: 650 mg via ORAL
  Filled 2020-08-06 (×2): qty 2

## 2020-08-06 MED ORDER — MIDAZOLAM HCL 2 MG/2ML IJ SOLN
1.0000 mg | Freq: Once | INTRAMUSCULAR | Status: AC
  Administered 2020-08-07: 1 mg via INTRAVENOUS
  Filled 2020-08-06: qty 2

## 2020-08-06 NOTE — Procedures (Signed)
Extubation Procedure Note  Patient Details:   Name: Margaret Mccann DOB: 1971-05-29 MRN: [id number]   Airway Documentation:    Vent end date: 08/06/20 Vent end time: 1011   Evaluation  O2 sats: stable throughout Complications: No apparent complications Patient did tolerate procedure well. Bilateral Breath Sounds: Clear, Diminished   Yes   Pt extubated to 4L [place] per MD order. Positive cuff leak noted prior to extubation. Pt able to speak and has a strong productive cough post extubation  Carolan Shiver 08/06/2020, 10:11 AM

## 2020-08-06 NOTE — Progress Notes (Signed)
CSW completed IVC paperwork with Dr. Katrinka Blazing - will fax to magistrate once notified that patient needs to be IVC.  Edwin Dada, MSW, LCSW-A Transitions of Care  Clinical Social Worker  [facility] Emergency Departments  Medical ICU [phone]

## 2020-08-06 NOTE — Consult Note (Signed)
  Margaret Mccann is a 49 year old female who presented to ER from home, after being found unconscious in bed after an intentional overdose on clonidine and clonazepam. Psych consult was placed due to her suicide attempt. Patient remains on the ventilator at this time. Will discontinue psych consult. Per chart review she appears to be agitated on the ventilator. At this time she is voluntary, if she attempts to leave or expresses interest in leaving, she will need to be placed under IVC. She is currently receiving psychiatric services at [facility] outpatient services. Once patient is medically stable will need to re consult psychiatric.

## 2020-08-06 NOTE — Progress Notes (Signed)
NAME:  Margaret Mccann, MRN:  [id number], DOB:  Aug 24, 1971, LOS: 2 ADMISSION DATE:  08/04/2020, CONSULTATION DATE:  11/10 REFERRING MD:  Dr. Particia Nearing EDP, CHIEF COMPLAINT:  Intentional overdose.    Brief History   49 year old female found in bed with empty bottles of clonazepam and clonidine. Intubated in ED for airway protection and started on dopamine for bradycardia. PCCM asked to admit.   History of present illness   Patient is encephalopathic and/or intubated. Therefore history has been obtained from chart review.   49 year old female with PMH as bel,wo which is significant for bipolar disorder, depression, anxiety, and Morgellons syndrome. She is treated with multiple psych medications. She has been grief stricken since her boyfriend died by suicide approximately three weeks prior to admission. She has been living with her mother since his death. On 2023/08/01 she filled scripts for clonidine (#30) and clonazepam (#60). On 11/10 her mother found her minimally responsive lying in bed with these bottles empty. EMS was called. Upon their arrival the patient's O2 sats were in the 40s with a heart rate of 28. EMS provided BVM ventilations and started epinephrine infusion. She was transdermally paced. Upon arrival to the ED her hemodynamics had somewhat stabilized, but her mental status still required intubation. Her condition was discussed with poison control and she was transitioned from epinephrine infusion to dopamine.   PCCM was asked to admit.  Past Medical History   has a past medical history of Anxiety, Bipolar 1 disorder (HCC), Depression, and Morgellons syndrome.  Significant Hospital Events     Consults:    Procedures:  ETT 11/10 > RIJ CVL 11/10 >  Significant Diagnostic Tests:    Micro Data:    Antimicrobials:     Interim history/subjective:  Reported intentional OD with clonidine and klonopin. Patient remains intubated with plans for SBT this morning.    Objective   Blood pressure 95/71, pulse 69, temperature 98.4 F (36.9 C), temperature source Oral, resp. rate 18, height 5\' 3"  (1.6 m), weight 70 kg, SpO2 100 %.    Vent Mode: PRVC FiO2 (%):  [40 %] 40 % Set Rate:  [18 bmp] 18 bmp Vt Set:  [420 mL] 420 mL PEEP:  [5 cmH20] 5 cmH20 Plateau Pressure:  [14 cmH20-17 cmH20] 14 cmH20   Intake/Output Summary (Last 24 hours) at 08/06/2020 0718 Last data filed at 08/06/2020 13/08/2020 Gross per 24 hour  Intake 3056.65 ml  Output 1165 ml  Net 1891.65 ml   Filed Weights   08/04/20 1934 08/05/20 0150 08/06/20 0223  Weight: 69 kg 70 kg 70 kg    Examination: GEN: on vent CV: regular rate and rhythm, no murmurs appreciated  RESP: no increased work of breathing, clear to ascultation bilaterally ABD: Bowel sounds present. soft, nontender, nondistended.  MSK: no LE edema GU: Foley in place  SKIN: warm, dry NEURO: sedated, arouses to touch         Resolved Hospital Problem list   Hypokalemia  Assessment & Plan:   Toxic encephalopathy  Intentional overdose clonidine and clonazepam  Patient sedated (Propofol). On dobutamine for sx bradycardia per poison control. Uses Klonopin and Clonidine at home. No benzo reversal agents were given.  Plan: Wean dopamine infusion and propofol  LR at 100 ml/hr Consult psychiatry for inpatient [facility]  Monitor TGs while on propofol   Acute hypoxemic respiratory failure  Patient required intubation in setting of toxic encephalopathy. Plan SBT with plan for extubation today  VAP per  protocol   Bipolar disorder  MDD  Anxiety  Suicide Attempt  Grief  Pt's partner recently committed suicide about 3 weeks ago. SA as above.  Plan: Hold home clonidine, clonazepam, gabapentin, fluoxetine, mirtazapine.  Recommend tapering off home Klonipin.   Hx of Polysubstance abuse Patient with hx of methamphetamine and cocaine abuse. UDS positive for cocaine and benzos (chronic use).  Plan TOC and psychiatry  consulted     Best practice:  Diet: NPO Pain/Anxiety/Delirium protocol (if indicated): Propofol VAP protocol (if indicated): Per protocol DVT prophylaxis: switch to Lovenox GI prophylaxis: PPI Glucose control: SSI Mobility: Bedrest Code Status: FULL Family Communication: pending Disposition: ICU  Labs   CBC: Recent Labs  Lab 08/04/20 1936 08/04/20 1937 08/04/20 2126 08/05/20 0344 08/06/20 0259  WBC 11.0*  --   --  13.7* 7.0  NEUTROABS 3.4  --   --   --   --   HGB 14.9 16.0* 15.0 14.1 12.4  HCT 46.1* 47.0* 44.0 43.3 37.6  MCV 95.8  --   --  96.0 93.8  PLT 391  --   --  271 220    Basic Metabolic Panel: Recent Labs  Lab 08/04/20 1936 08/04/20 1937 08/04/20 1945 08/04/20 2126 08/05/20 0344 08/06/20 0259  NA 139 141  --  141 139 139  K 2.7* 2.7*  --  3.5 3.9 3.8  CL 102 105  --   --  108 104  CO2 19*  --   --   --  19* 24  GLUCOSE 187* 182*  --   --  103* 95  BUN 12 16  --   --  9 6  CREATININE 1.18* 0.90  --   --  0.71 0.90  CALCIUM 9.9  --   --   --  8.9 9.2  MG  --   --  2.6*  --  1.9  --   PHOS  --   --   --   --  3.9  --    GFR: Estimated Creatinine Clearance: 72.5 mL/min (by C-G formula based on SCr of 0.9 mg/dL). Recent Labs  Lab 08/04/20 1936 08/05/20 0344 08/06/20 0259  WBC 11.0* 13.7* 7.0    Liver Function Tests: Recent Labs  Lab 08/04/20 1936  AST 48*  ALT 52*  ALKPHOS 119  BILITOT 0.8  PROT 7.9  ALBUMIN 4.4   No results for input(s): LIPASE, AMYLASE in the last 168 hours. No results for input(s): AMMONIA in the last 168 hours.  ABG    Component Value Date/Time   PHART 7.357 08/04/2020 2126   PCO2ART 41.4 08/04/2020 2126   PO2ART 95 08/04/2020 2126   HCO3 24.1 08/04/2020 2126   TCO2 25 08/04/2020 2126   ACIDBASEDEF 3.0 (H) 08/04/2020 2126   O2SAT 98.0 08/04/2020 2126     Coagulation Profile: No results for input(s): INR, PROTIME in the last 168 hours.  Cardiac Enzymes: No results for input(s): CKTOTAL, CKMB,  CKMBINDEX, TROPONINI in the last 168 hours.  HbA1C: No results found for: HGBA1C  CBG: Recent Labs  Lab 08/05/20 1156 08/05/20 1548 08/05/20 1951 08/05/20 2345 08/06/20 0336  GLUCAP 104* 96 102* 96 89    Review of Systems:   Patient is encephalopathic and/or intubated. Therefore history has been obtained from chart review.    Past Medical History  She,  has a past medical history of Anxiety, Bipolar 1 disorder (HCC), Depression, and Morgellons syndrome.   Surgical History    Past Surgical History:  Procedure Laterality Date  . APPENDECTOMY       Social History   reports that she has quit smoking. Her smoking use included cigarettes. She smoked 0.50 packs per day. She has never used smokeless tobacco. She reports current alcohol use. She reports previous drug use.   Family History   Her family history includes Breast cancer in her paternal aunt; Heart attack (age of onset: 62) in her father; Stroke in her father. There is no history of Ulcerative colitis or Esophageal cancer.   Allergies Allergies  Allergen Reactions  . Lamictal [Lamotrigine] Anaphylaxis, Rash and Other (See Comments)    Stevens-Johnson syndrome and fevers, also  . Lamictal [Lamotrigine] Anaphylaxis, Rash and Other (See Comments)    Fevers and Stevens-Johnson syndrome, also  . Tramadol Other (See Comments)    Pt had a seizure after taking Tramadol!!  . Sulfamethoxazole-Trimethoprim Rash  . Erythromycin Nausea And Vomiting  . Erythromycin Base Nausea And Vomiting  . Levetiracetam Anxiety  . Sulfa Antibiotics Rash     Home Medications  Prior to Admission medications   Medication Sig Start Date End Date Taking? Authorizing Provider  clonazePAM (KLONOPIN) 0.5 MG tablet Take 1 tablet (0.5 mg total) by mouth 2 (two) times daily as needed for anxiety. 07/30/20   Zena Amos, MD  cloNIDine (CATAPRES) 0.2 MG tablet Take 1 tablet (0.2 mg total) by mouth at bedtime. 07/30/20   Zena Amos, MD    FLUoxetine (PROZAC) 40 MG capsule Take 1 capsule (40 mg total) by mouth daily. 07/30/20   Zena Amos, MD  gabapentin (NEURONTIN) 600 MG tablet Take 2 tablets (1,200 mg total) by mouth 2 (two) times daily. 07/30/20   Zena Amos, MD  mirtazapine (REMERON) 30 MG tablet Take 1 tablet (30 mg total) by mouth at bedtime. 07/30/20   Zena Amos, MD     Critical care time:      Katha Cabal, DO PGY-2, [facility] 08/06/2020 7:18 AM

## 2020-08-06 NOTE — Progress Notes (Signed)
RT asked to see pt by RN. Upon arrival to room pt crying in the fetal position. SpO2 on 4L nasal cannula 98%, no distress noted. Pt with loose cough with moderate clear frothy secretions. Pt neck listened to and a mild stridor was heard. Dr. Katrinka Blazing notified and Racemic Epi x1 given by RT. RT will continue to monitor and be available as needed.

## 2020-08-06 NOTE — Progress Notes (Signed)
Pt has had low grade temp last two checks, this is new.  CCM MD notified.

## 2020-08-06 NOTE — Progress Notes (Signed)
eLink Physician-Brief Progress Note Patient Name: Margaret Mccann Elvin DOB: 02-20-1971 MRN: [id number]   Date of Service  08/06/2020  HPI/Events of Note  Extreme anxiety - Patient wants to leave hospital. Patient on multiple medications at home.    eICU Interventions  Plan: 1. Restart home medications:      A. Prozac 40 mg PO Q day.      B. Neurontin 300 mg PO BID.      C. Remeron 30 mg PO at bed time.  2. If she remains extremely anxious after restarting home medications: Versed 1 mg IV X 1.     Intervention Category Major Interventions: Delirium, psychosis, severe agitation - evaluation and management  Ajanae Virag Eugene 08/06/2020, 10:41 PM

## 2020-08-07 ENCOUNTER — Inpatient Hospital Stay (HOSPITAL_COMMUNITY): Payer: Self-pay

## 2020-08-07 LAB — BASIC METABOLIC PANEL
Anion gap: 13 (ref 5–15)
BUN: <5 mg/dL — ABNORMAL LOW (ref 6–20)
CO2: 25 mmol/L (ref 22–32)
Calcium: 9.2 mg/dL (ref 8.9–10.3)
Chloride: 102 mmol/L (ref 98–111)
Creatinine, Ser: 0.87 mg/dL (ref 0.44–1.00)
GFR, Estimated: >60 mL/min (ref >60)
Glucose, Bld: 104 mg/dL — ABNORMAL HIGH (ref 70–99)
Potassium: 3.6 mmol/L (ref 3.5–5.1)
Sodium: 140 mmol/L (ref 135–145)

## 2020-08-07 LAB — CBC
HCT: 36.7 % (ref 36.0–46.0)
Hemoglobin: 11.8 g/dL — ABNORMAL LOW (ref 12.0–15.0)
MCH: 30.9 pg (ref 26.0–34.0)
MCHC: 32.2 g/dL (ref 30.0–36.0)
MCV: 96.1 fL (ref 80.0–100.0)
Platelets: 208 10*3/uL (ref 150–400)
RBC: 3.82 MIL/uL — ABNORMAL LOW (ref 3.87–5.11)
RDW: 14.1 % (ref 11.5–15.5)
WBC: 8.9 10*3/uL (ref 4.0–10.5)
nRBC: 0 % (ref 0.0–0.2)

## 2020-08-07 LAB — GLUCOSE, CAPILLARY
Glucose-Capillary: 126 mg/dL — ABNORMAL HIGH (ref 70–99)
Glucose-Capillary: 70 mg/dL (ref 70–99)
Glucose-Capillary: 99 mg/dL (ref 70–99)

## 2020-08-07 LAB — TRIGLYCERIDES: Triglycerides: 189 mg/dL — ABNORMAL HIGH (ref <150)

## 2020-08-07 MED ORDER — NICOTINE 14 MG/24HR TD PT24
14.0000 mg | MEDICATED_PATCH | Freq: Every day | TRANSDERMAL | Status: DC
  Administered 2020-08-07 – 2020-08-11 (×5): 14 mg via TRANSDERMAL
  Filled 2020-08-07 (×5): qty 1

## 2020-08-07 MED ORDER — CLONAZEPAM 0.5 MG PO TABS
0.5000 mg | ORAL_TABLET | Freq: Two times a day (BID) | ORAL | Status: DC | PRN
  Administered 2020-08-07 – 2020-08-11 (×9): 0.5 mg via ORAL
  Filled 2020-08-07 (×9): qty 1

## 2020-08-07 MED ORDER — METHYLPREDNISOLONE SODIUM SUCC 125 MG IJ SOLR
40.0000 mg | Freq: Four times a day (QID) | INTRAMUSCULAR | Status: DC
  Administered 2020-08-07 (×2): 40 mg via INTRAVENOUS
  Filled 2020-08-07 (×2): qty 2

## 2020-08-07 MED ORDER — LORAZEPAM 2 MG/ML IJ SOLN
1.0000 mg | INTRAMUSCULAR | Status: DC | PRN
  Administered 2020-08-07 – 2020-08-11 (×12): 1 mg via INTRAVENOUS
  Filled 2020-08-07 (×13): qty 1

## 2020-08-07 MED ORDER — RACEPINEPHRINE HCL 2.25 % IN NEBU
INHALATION_SOLUTION | RESPIRATORY_TRACT | Status: AC
  Filled 2020-08-07: qty 0.5

## 2020-08-07 MED ORDER — POLYETHYLENE GLYCOL 3350 17 G PO PACK
17.0000 g | PACK | Freq: Every day | ORAL | Status: DC
  Administered 2020-08-08 – 2020-08-09 (×2): 17 g via ORAL
  Filled 2020-08-07 (×4): qty 1

## 2020-08-07 MED ORDER — ORAL CARE MOUTH RINSE
15.0000 mL | Freq: Two times a day (BID) | OROMUCOSAL | Status: DC
  Administered 2020-08-07 – 2020-08-11 (×7): 15 mL via OROMUCOSAL

## 2020-08-07 MED ORDER — RACEPINEPHRINE HCL 2.25 % IN NEBU
INHALATION_SOLUTION | RESPIRATORY_TRACT | Status: AC
  Administered 2020-08-07: 0.5 mL
  Filled 2020-08-07: qty 0.5

## 2020-08-07 MED ORDER — POTASSIUM CHLORIDE CRYS ER 20 MEQ PO TBCR
40.0000 meq | EXTENDED_RELEASE_TABLET | Freq: Once | ORAL | Status: AC
  Administered 2020-08-07: 40 meq via ORAL
  Filled 2020-08-07: qty 2

## 2020-08-07 MED ORDER — PANTOPRAZOLE SODIUM 40 MG PO TBEC
40.0000 mg | DELAYED_RELEASE_TABLET | Freq: Every day | ORAL | Status: DC
  Administered 2020-08-07 – 2020-08-10 (×4): 40 mg via ORAL
  Filled 2020-08-07 (×4): qty 1

## 2020-08-07 MED ORDER — DOCUSATE SODIUM 100 MG PO CAPS
100.0000 mg | ORAL_CAPSULE | Freq: Two times a day (BID) | ORAL | Status: DC
  Administered 2020-08-08 – 2020-08-09 (×3): 100 mg via ORAL
  Filled 2020-08-07 (×7): qty 1

## 2020-08-07 MED ORDER — DEXAMETHASONE 2 MG PO TABS
2.0000 mg | ORAL_TABLET | Freq: Three times a day (TID) | ORAL | Status: AC
  Administered 2020-08-07 – 2020-08-08 (×3): 2 mg via ORAL
  Filled 2020-08-07 (×4): qty 1

## 2020-08-07 NOTE — Progress Notes (Signed)
eLink Physician-Brief Progress Note Patient Name: Margaret Mccann DOB: 04-Feb-1971 MRN: [id number]   Date of Service  08/07/2020  HPI/Events of Note  Stridor and wheezing. Respiratory therapy giving Racemic Epi Neb Rx. Request for steroids. RR = 20 and Sat = 95%.  eICU Interventions  Plan: 1. Portable CXR STAT.  2. Solumedrol 40 mg IV now and Q 6 hours X 4 doses.  3. Will request that ground team assess the patient at bedside.      Intervention Category Major Interventions: Other:  Lenell Antu 08/07/2020, 1:43 AM

## 2020-08-07 NOTE — Progress Notes (Signed)
Pt seen and examined at the request of elink.  The pt was having stridor now.  She had been extubated yesterday morning.  On arrival the pt had received racemic epi and the airway is now clear.  No stridor currently.  Continue to monitor.

## 2020-08-07 NOTE — Progress Notes (Signed)
NAME:  Margaret Mccann, MRN:  [id number], DOB:  [birth year], LOS: 3 ADMISSION DATE:  08/04/2020, CONSULTATION DATE:  11/10 REFERRING MD:  Dr. Particia Nearing EDP, CHIEF COMPLAINT:  Intentional overdose.    Brief History   49 year old female found in bed with empty bottles of clonazepam and clonidine. Intubated in ED for airway protection and started on dopamine for bradycardia. PCCM asked to admit.   History of present illness   Patient is encephalopathic and/or intubated. Therefore history has been obtained from chart review.   49 year old female with PMH as bel,wo which is significant for bipolar disorder, depression, anxiety, and Morgellons syndrome. She is treated with multiple psych medications. She has been grief stricken since her boyfriend died by suicide approximately three weeks prior to admission. She has been living with her mother since his death. On 2023-08-13 she filled scripts for clonidine (#30) and clonazepam (#60). On 11/10 her mother found her minimally responsive lying in bed with these bottles empty. EMS was called. Upon their arrival the patient's O2 sats were in the 40s with a heart rate of 28. EMS provided BVM ventilations and started epinephrine infusion. She was transdermally paced. Upon arrival to the ED her hemodynamics had somewhat stabilized, but her mental status still required intubation. Her condition was discussed with poison control and she was transitioned from epinephrine infusion to dopamine.   PCCM was asked to admit.  Past Medical History   has a past medical history of Anxiety, Bipolar 1 disorder (HCC), Depression, and Morgellons syndrome.  Significant Hospital Events     Consults:    Procedures:  ETT 11/10 > RIJ CVL 11/10 >  Significant Diagnostic Tests:    Micro Data:    Antimicrobials:     Interim history/subjective:  No events, extremely anxious with labile moods and volitional stridor prompted steroids and racemic epinephrine  overnight.  More calm this AM.  Hungry, denies pain.  Objective   Blood pressure 101/70, pulse 70, temperature 98.7 F (37.1 C), temperature source Axillary, resp. rate 14, height 5\' 3"  (1.6 m), weight 71.9 kg, SpO2 100 %.        Intake/Output Summary (Last 24 hours) at 08/07/2020 0858 Last data filed at 08/07/2020 0741 Gross per 24 hour  Intake 2205.91 ml  Output 3305 ml  Net -1099.09 ml   Filed Weights   08/05/20 0150 08/06/20 0223 08/07/20 0500  Weight: 70 kg 70 kg 71.9 kg    Examination: Constitutional: tearful woman sitting in bed  Eyes: tracking, pupils equal Ears, nose, mouth, and throat: trachea midline, MMM Cardiovascular: RRR, ext warm Respiratory: clear, no wheezing Gastrointestinal: soft, +BS Skin: No rashes, normal turgor Neurologic: moves all 4 ext to command Psychiatric: flat affect, AOx3    Resolved Hospital Problem list   Hypokalemia  Assessment & Plan:   Toxic encephalopathy  Intentional overdose clonidine and clonazepam  Acute hypoxemic respiratory failure - resolved Bipolar disorder  MDD  Anxiety  Suicide Attempt  Grief - Pt's partner recently committed suicide about 3 weeks ago. SA as above.  Plan: Hx of Polysubstance abuse- Patient with hx of methamphetamine and cocaine abuse. UDS positive for cocaine and benzos (chronic use).  Stridor- I quest VCD more than anything, improves with anxiolytics  - Home psychiatric meds resumed - 3 doses decadron then DC for stridor vs. Vocal cord inflammation from ETT - Suicide precautions, safety sitter - Awaiting psych eval for transfer to inpatient facility - Stable for floor transfer, appreciate TRH taking  over 11/14  Myrla Halsted MD PCCM

## 2020-08-07 NOTE — Progress Notes (Signed)
K+ 3.6 this AM with Creat 0.87 & GFR > 60. Elink CCM electrolyte protocol initiated per order.

## 2020-08-07 NOTE — Consult Note (Signed)
[facility] Face-to-Face Psychiatry Consult   Reason for Consult: ''inpatient admission post intentional overdose, medically stable.''  Referring Physician:  Levon Hedger, MD Patient Identification: Margaret Mccann MRN:  [id number] Principal Diagnosis: Intentional drug overdose [facility]) Diagnosis:  Principal Problem:   Intentional drug overdose (HCC) Active Problems:   MDD (major depressive disorder), recurrent episode, severe (HCC)   Total Time spent with patient: 1 hour  Subjective:   Margaret Mccann is a 49 y.o. female patient admitted with suicide attempt by drug overdose.  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 Patient is a poor historian but reports mourning the death of her boyfriend who died by suicide few weeks ago. She reports worsening depressive symptoms characterized by hopelessness, worthlessness, low energy level, lack of motivation and recurrent suicidal thoughts. Patient is tearful, denies psychosis, delusions but unable to contract for safety.  Past Psychiatric History: as above  Risk to Self:  suicidal Risk to Others:  denies Prior Inpatient Therapy:   Prior Outpatient Therapy:  [facility]  Past Medical History:  Past Medical History:  Diagnosis Date  . Anxiety   . Bipolar 1 disorder (HCC)   . Depression   . Morgellons syndrome     Past Surgical History:  Procedure Laterality Date  . APPENDECTOMY     Family History:  Family History  Problem Relation Age of Onset  . Heart attack Father 43  . Stroke Father   . Breast cancer Paternal Aunt   . Ulcerative colitis Neg Hx   . Esophageal cancer Neg Hx    Family Psychiatric  History:  Social History:  Social History   Substance and Sexual Activity  Alcohol Use Yes   Comment: occas     Social History   Substance and  Sexual Activity  Drug Use Not Currently   Comment: not currently-was +cocaine and amphetamines, clean x3 years    Social History   Socioeconomic History  . Marital status: Single    Spouse name: Not on file  . Number of children: Not on file  . Years of education: Not on file  . Highest education level: Not on file  Occupational History  . Not on file  Tobacco Use  . Smoking status: Former Smoker    Packs/day: 0.50    Types: Cigarettes  . Smokeless tobacco: Never Used  Vaping Use  . Vaping Use: Never used  Substance and Sexual Activity  . Alcohol use: Yes    Comment: occas  . Drug use: Not Currently    Comment: not currently-was +cocaine and amphetamines, clean x3 years  . Sexual activity: Yes    Birth control/protection: Pill  Other Topics Concern  . Not on file  Social History Narrative   ** Merged History Encounter **       Social Determinants of Health   Financial Resource Strain:   . Difficulty of Paying Living Expenses: Not on file  Food Insecurity:   . Worried About Programme researcher, broadcasting/film/video in the Last Year: Not on file  . Ran Out of Food in the Last Year: Not on file  Transportation Needs:   . Lack of Transportation (Medical): Not on file  . Lack of Transportation (Non-Medical): Not on file  Physical Activity:   . Days of Exercise per Week: Not on file  . Minutes of Exercise per Session: Not on file  Stress:   .  Feeling of Stress : Not on file  Social Connections:   . Frequency of Communication with Friends and Family: Not on file  . Frequency of Social Gatherings with Friends and Family: Not on file  . Attends Religious Services: Not on file  . Active Member of Clubs or Organizations: Not on file  . Attends Banker Meetings: Not on file  . Marital Status: Not on file   Additional Social History:    Allergies:   Allergies  Allergen Reactions  . Lamictal [Lamotrigine] Anaphylaxis, Rash and Other (See Comments)    Stevens-Johnson syndrome  and fevers, also  . Lamictal [Lamotrigine] Anaphylaxis, Rash and Other (See Comments)    Fevers and Stevens-Johnson syndrome, also  . Tramadol Other (See Comments)    Pt had a seizure after taking Tramadol!!  . Sulfamethoxazole-Trimethoprim Rash  . Erythromycin Nausea And Vomiting  . Erythromycin Base Nausea And Vomiting  . Levetiracetam Anxiety  . Sulfa Antibiotics Rash    Labs:  Results for orders placed or performed during the hospital encounter of 08/04/20 (from the past 48 hour(s))  Glucose, capillary     Status: None   Collection Time: 08/05/20  3:48 PM  Result Value Ref Range   Glucose-Capillary 96 70 - 99 mg/dL    Comment: Glucose reference range applies only to samples taken after fasting for at least 8 hours.  Glucose, capillary     Status: Abnormal   Collection Time: 08/05/20  7:51 PM  Result Value Ref Range   Glucose-Capillary 102 (H) 70 - 99 mg/dL    Comment: Glucose reference range applies only to samples taken after fasting for at least 8 hours.  Glucose, capillary     Status: None   Collection Time: 08/05/20 11:45 PM  Result Value Ref Range   Glucose-Capillary 96 70 - 99 mg/dL    Comment: Glucose reference range applies only to samples taken after fasting for at least 8 hours.  Triglycerides     Status: Abnormal   Collection Time: 08/06/20  2:59 AM  Result Value Ref Range   Triglycerides 338 (H) <150 mg/dL    Comment: Performed at [facility], [address] [place], [place] [address]  Basic metabolic panel     Status: None   Collection Time: 08/06/20  2:59 AM  Result Value Ref Range   Sodium 139 135 - 145 mmol/L   Potassium 3.8 3.5 - 5.1 mmol/L   Chloride 104 98 - 111 mmol/L   CO2 24 22 - 32 mmol/L   Glucose, Bld 95 70 - 99 mg/dL    Comment: Glucose reference range applies only to samples taken after fasting for at least 8 hours.   BUN 6 6 - 20 mg/dL   Creatinine, Ser 4.78 0.44 - 1.00 mg/dL   Calcium 9.2 8.9 - 29.5 mg/dL   GFR, Estimated >62 >13  mL/min    Comment: (NOTE) Calculated using the CKD-EPI Creatinine Equation (2021)    Anion gap 11 5 - 15    Comment: Performed at [facility], [address], [place], [place] [address]  CBC     Status: None   Collection Time: 08/06/20  2:59 AM  Result Value Ref Range   WBC 7.0 4.0 - 10.5 K/uL   RBC 4.01 3.87 - 5.11 MIL/uL   Hemoglobin 12.4 12.0 - 15.0 g/dL   HCT 84.6 36 - 46 %   MCV 93.8 80.0 - 100.0 fL   MCH 30.9 26.0 -  34.0 pg   MCHC 33.0 30.0 - 36.0 g/dL   RDW 51.0 25.8 - 52.7 %   Platelets 220 150 - 400 K/uL   nRBC 0.0 0.0 - 0.2 %    Comment: Performed at [facility], [address] [place], [place] [address]  Glucose, capillary     Status: None   Collection Time: 08/06/20  3:36 AM  Result Value Ref Range   Glucose-Capillary 89 70 - 99 mg/dL    Comment: Glucose reference range applies only to samples taken after fasting for at least 8 hours.  Glucose, capillary     Status: None   Collection Time: 08/06/20  7:58 AM  Result Value Ref Range   Glucose-Capillary 79 70 - 99 mg/dL    Comment: Glucose reference range applies only to samples taken after fasting for at least 8 hours.  Glucose, capillary     Status: None   Collection Time: 08/06/20  6:10 PM  Result Value Ref Range   Glucose-Capillary 89 70 - 99 mg/dL    Comment: Glucose reference range applies only to samples taken after fasting for at least 8 hours.  Glucose, capillary     Status: None   Collection Time: 08/06/20  8:29 PM  Result Value Ref Range   Glucose-Capillary 76 70 - 99 mg/dL    Comment: Glucose reference range applies only to samples taken after fasting for at least 8 hours.  Glucose, capillary     Status: None   Collection Time: 08/07/20 12:49 AM  Result Value Ref Range   Glucose-Capillary 70 70 - 99 mg/dL    Comment: Glucose reference range applies only to samples taken after fasting for at least 8 hours.  Glucose, capillary     Status: None   Collection Time: 08/07/20  4:34 AM   Result Value Ref Range   Glucose-Capillary 99 70 - 99 mg/dL    Comment: Glucose reference range applies only to samples taken after fasting for at least 8 hours.  Basic metabolic panel     Status: Abnormal   Collection Time: 08/07/20  5:00 AM  Result Value Ref Range   Sodium 140 135 - 145 mmol/L   Potassium 3.6 3.5 - 5.1 mmol/L   Chloride 102 98 - 111 mmol/L   CO2 25 22 - 32 mmol/L   Glucose, Bld 104 (H) 70 - 99 mg/dL    Comment: Glucose reference range applies only to samples taken after fasting for at least 8 hours.   BUN <5 (L) 6 - 20 mg/dL   Creatinine, Ser 3.53 0.44 - 1.00 mg/dL   Calcium 9.2 8.9 - 61.4 mg/dL   GFR, Estimated >43 >15 mL/min    Comment: (NOTE) Calculated using the CKD-EPI Creatinine Equation (2021)    Anion gap 13 5 - 15    Comment: Performed at [facility], [address], [place], [place] [address]  CBC     Status: Abnormal   Collection Time: 08/07/20  5:00 AM  Result Value Ref Range   WBC 8.9 4.0 - 10.5 K/uL   RBC 3.82 (L) 3.87 - 5.11 MIL/uL   Hemoglobin 11.8 (L) 12.0 - 15.0 g/dL   HCT 76.1 36 - 46 %   MCV 96.1 80.0 - 100.0 fL   MCH 30.9 26.0 - 34.0 pg   MCHC 32.2 30.0 - 36.0 g/dL   RDW 95.0 93.2 - 67.1 %   Platelets 208 150 - 400 K/uL   nRBC 0.0 0.0 -  0.2 %    Comment: Performed at [facility], [address] [place], [place] [address]  Triglycerides     Status: Abnormal   Collection Time: 08/07/20  5:00 AM  Result Value Ref Range   Triglycerides 189 (H) <150 mg/dL    Comment: Performed at [facility], [address] [place], [place] [address]  Glucose, capillary     Status: Abnormal   Collection Time: 08/07/20  8:07 AM  Result Value Ref Range   Glucose-Capillary 126 (H) 70 - 99 mg/dL    Comment: Glucose reference range applies only to samples taken after fasting for at least 8 hours.    Current Facility-Administered Medications  Medication Dose Route Frequency Provider Last Rate Last Admin  . 0.9 %  sodium chloride  infusion   Intravenous Continuous Jacalyn LefevreHaviland, Julie, MD      . acetaminophen (TYLENOL) tablet 650 mg  650 mg Oral Q6H PRN Lorin GlassSmith, Daniel C, MD      . Chlorhexidine Gluconate Cloth 2 % PADS 6 each  6 each Topical Q0600 Briant SitesMarshall, Jessica, DO      . clonazePAM Scarlette Calico(KLONOPIN) tablet 0.5 mg  0.5 mg Oral BID PRN Lorin GlassSmith, Daniel C, MD   0.5 mg at 08/07/20 1108  . dexamethasone (DECADRON) tablet 2 mg  2 mg Oral Q8H Lorin GlassSmith, Daniel C, MD      . docusate sodium (COLACE) capsule 100 mg  100 mg Oral BID Lorin GlassSmith, Daniel C, MD      . enoxaparin (LOVENOX) injection 40 mg  40 mg Subcutaneous Q24H Lorin GlassSmith, Daniel C, MD   40 mg at 08/06/20 1452  . FLUoxetine (PROZAC) capsule 40 mg  40 mg Oral Daily Karl ItoSommer, Steven E, MD   40 mg at 08/07/20 1106  . gabapentin (NEURONTIN) capsule 300 mg  300 mg Oral BID Karl ItoSommer, Steven E, MD   300 mg at 08/07/20 1107  . LORazepam (ATIVAN) injection 1 mg  1 mg Intravenous Q4H PRN Lorin GlassSmith, Daniel C, MD   1 mg at 08/07/20 1138  . MEDLINE mouth rinse  15 mL Mouth Rinse BID Lorin GlassSmith, Daniel C, MD   15 mL at 08/07/20 1108  . mirtazapine (REMERON) tablet 30 mg  30 mg Oral QHS Karl ItoSommer, Steven E, MD   30 mg at 08/06/20 2304  . pantoprazole (PROTONIX) EC tablet 40 mg  40 mg Oral QHS Lorin GlassSmith, Daniel C, MD      . polyethylene glycol [facility](MIRALAX / GLYCOLAX) packet 17 g  17 g Oral Daily Lorin GlassSmith, Daniel C, MD      . Racepinephrine HCl 2.25 % nebulizer solution           . sodium chloride flush (NS) 0.9 % injection 10-40 mL  10-40 mL Intracatheter Q12H Kirtland Bouchardorothy, Christopher R, MD   10 mL at 08/07/20 1108  . sodium chloride flush (NS) 0.9 % injection 10-40 mL  10-40 mL Intracatheter PRN Kirtland Bouchardorothy, Christopher R, MD   10 mL at 08/05/20 0140    Musculoskeletal: Strength & Muscle Tone: not tested Gait & Station: not tested Patient leans: N/A  Psychiatric Specialty Exam: Physical Exam Psychiatric:        Attention and Perception: She is inattentive.        Mood and Affect: Mood is anxious and depressed. Affect is tearful.         Speech: Speech normal.        Behavior: Behavior is slowed and withdrawn.        Thought Content:  Thought content includes suicidal ideation.        Cognition and Memory: Cognition and memory normal.        Judgment: Judgment is impulsive.     Review of Systems  Constitutional: Positive for fatigue.  HENT: Negative.   Eyes: Negative.   Psychiatric/Behavioral: Positive for dysphoric mood and suicidal ideas. The patient is nervous/anxious.     Blood pressure 125/84, pulse 78, temperature 98.6 F (37 C), temperature source Oral, resp. rate 18, height 5\' 3"  (1.6 m), weight 71.9 kg, SpO2 95 %.Body mass index is 28.08 kg/m.  General Appearance: Casual  Eye Contact:  Fair  Speech:  Clear and Coherent and Slow  Volume:  Decreased  Mood:  Anxious and Dysphoric  Affect:  Blunt and Tearful  Thought Process:  Coherent and Linear  Orientation:  Full (Time, Place, and Person)  Thought Content:  Logical  Suicidal Thoughts:  Yes.  without intent/plan  Homicidal Thoughts:  No  Memory:  Immediate;   Fair Recent;   Fair Remote;   Fair  Judgement:  Poor  Insight:  Shallow  Psychomotor Activity:  Psychomotor Retardation  Concentration:  Concentration: Fair and Attention Span: Fair  Recall:  of Knowledge:  Fair  Language:  Good  Akathisia:  No  Handed:  Right  AIMS (if indicated):     Assets:  Communication Skills Desire for Improvement Social Support  ADL's:  Intact  Cognition:  WNL  Sleep:        Treatment Plan Summary: 49 year old female with history of multiple mental disorders who was admitted after she intentionally attempted suicide by overdosing on drugs/medications. Patient continues to report worsening depression and suicidal thoughts. She will benefit from inpatient psychiatric admission after she is medically cleared.  Recommendations: -Continue 1:1 sitter for safety -Continues Prozac 40 mg daily for depression -Continue Gabapentin 300 mg bid for  mood/anxiety -Consider social worker consult to facilitate psychiatric inpatient admission -Consider IVC if patient refused Voluntary psychiatric admission.  Disposition: Recommend psychiatric Inpatient admission when medically cleared. Supportive therapy provided about ongoing stressors. Psychiatric service signing out. Re-consult as needed  59, MD 08/07/2020 12:17 PM

## 2020-08-08 DIAGNOSIS — T50902D Poisoning by unspecified drugs, medicaments and biological substances, intentional self-harm, subsequent encounter: Secondary | ICD-10-CM

## 2020-08-08 MED ORDER — PHENOL 1.4 % MT LIQD
1.0000 | OROMUCOSAL | Status: DC | PRN
  Administered 2020-08-08 – 2020-08-09 (×3): 1 via OROMUCOSAL
  Filled 2020-08-08: qty 177

## 2020-08-08 MED ORDER — BENZONATATE 100 MG PO CAPS
100.0000 mg | ORAL_CAPSULE | Freq: Three times a day (TID) | ORAL | Status: DC | PRN
  Administered 2020-08-08 – 2020-08-11 (×7): 100 mg via ORAL
  Filled 2020-08-08 (×7): qty 1

## 2020-08-08 MED ORDER — GUAIFENESIN ER 600 MG PO TB12
600.0000 mg | ORAL_TABLET | Freq: Two times a day (BID) | ORAL | Status: DC
  Administered 2020-08-08 – 2020-08-11 (×7): 600 mg via ORAL
  Filled 2020-08-08 (×7): qty 1

## 2020-08-08 MED ORDER — DEXTROMETHORPHAN POLISTIREX ER 30 MG/5ML PO SUER: 15.0000 mg | Freq: Four times a day (QID) | ORAL | Status: DC | PRN

## 2020-08-08 NOTE — [facility] Counselor (Signed)
TTS attempted to CSW, Sharlynn Oliphant, at [phone] to assist with disposition questions.

## 2020-08-08 NOTE — Progress Notes (Signed)
   08/08/20 1630  Clinical Encounter Type  Visited With Patient  Visit Type Spiritual support  Referral From Nurse  Consult/Referral To Chaplain  Chaplain responded. Patient with sitter at bedside requesting to speak with a Buddhist priest. She does not have a spiritual leader; however, she reads about Buddhist practices. Chaplain apologized that we do not have Buddhist priest on call today. Chaplain advised a referral will be placed for a visit. Chaplain spoke with patient's nurse(Phen),he said ok for patient to have a visit from  spiritual lead. This note was prepared by Deneen Harts, M.Div..  For questions please contact by phone [phone].

## 2020-08-08 NOTE — Progress Notes (Signed)
PROGRESS NOTE    Margaret Mccann  QIW:[id number] DOB: 1971/09/21 DOA: 08/04/2020 PCP: Patient, No Pcp Per  Brief Narrative: 49 year old female with depression, anxiety, bipolar disorder, more gallons syndrome on multiple psychotropic meds was brought to the ED following overdose on clonazepam and clonidine after being grief stricken since her boyfriend committed suicide 3 weeks prior. -On 11/10 mother found her minimally responsive in bed with empty bottles, when EMS arrived O2 sats were in the 40s, heart rate was 28, she was started on epi, transdermally pain's and brought to the ED where she was intubated, subsequently transitioned from epinephrine to dopamine infusion and admitted to ICU. -She was extubated 11/12, subsequently had some stridor and was treated with dexamethasone -Seen by psych 11/13 recommended inpatient psych, IVC if needed and sitter -11/14 transferred to [facility] service   Assessment & Plan:   Intentional overdose of clonidine and clonazepam Toxic encephalopathy Acute hypoxic respiratory failure -Extubated 11/12, add incentive spirometry -Wean off O2 -Also treated with Decadron for stridor following extubation -Discontinue Foley catheter -Seen by psychiatry, recommended inpatient psych, one-to-one sitter and IVC if needed -Also restarted Prozac gabapentin -Will consult social work, she is medically stable for discharge to inpatient psych  Polysubstance abuse History of methamphetamine and cocaine use -UDS positive for benzodiazepine and cocaine on admission  DVT prophylaxis: Lovenox Code Status: Full code Family Communication: Mother at bedside Disposition Plan:  Status is: Inpatient  Remains inpatient appropriate because:Inpatient level of care appropriate due to severity of illness   Dispo: The patient is from: Home              Anticipated d/c is to: Inpatient psych              Anticipated d/c date is: 1 day              Patient currently is not  medically stable to d/c.  Consultants:   Psych, PCCM transfer   Procedures:   Antimicrobials:    Subjective: -Upset and tearful, wants to go home, denies suicidal ideations now  Objective: Vitals:   08/08/20 0500 08/08/20 0641 08/08/20 0937 08/08/20 1139  BP:  (!) 138/92 (!) 144/83   Pulse:  71 88 85  Resp:  15 16   Temp:  98.1 F (36.7 C) 98.3 F (36.8 C)   TempSrc:  Oral Oral   SpO2:  95% 96% 95%  Weight: 67.8 kg     Height:        Intake/Output Summary (Last 24 hours) at 08/08/2020 1143 Last data filed at 08/08/2020 1000 Gross per 24 hour  Intake 1200 ml  Output 1200 ml  Net 0 ml   Filed Weights   08/06/20 0223 08/07/20 0500 08/08/20 0500  Weight: 70 kg 71.9 kg 67.8 kg    Examination:  General exam: Awake alert oriented x3, tearful CVS: S1-S2, regular rate rhythm Lungs: Decreased breath sounds the bases Abdomen: Soft, nontender, bowel sounds present Extremities: No edema  Psychiatry: Tearful and upset    Data Reviewed:   CBC: Recent Labs  Lab 08/04/20 1936 08/04/20 1936 08/04/20 1937 08/04/20 2126 08/05/20 0344 08/06/20 0259 08/07/20 0500  WBC 11.0*  --   --   --  13.7* 7.0 8.9  NEUTROABS 3.4  --   --   --   --   --   --   HGB 14.9   < > 16.0* 15.0 14.1 12.4 11.8*  HCT 46.1*   < > 47.0* 44.0 43.3 37.6 36.7  MCV 95.8  --   --   --  96.0 93.8 96.1  PLT 391  --   --   --  271 220 208   < > = values in this interval not displayed.   Basic Metabolic Panel: Recent Labs  Lab 08/04/20 1936 08/04/20 1936 08/04/20 1937 08/04/20 1945 08/04/20 2126 08/05/20 0344 08/06/20 0259 08/07/20 0500  NA 139   < > 141  --  141 139 139 140  K 2.7*   < > 2.7*  --  3.5 3.9 3.8 3.6  CL 102  --  105  --   --  108 104 102  CO2 19*  --   --   --   --  19* 24 25  GLUCOSE 187*  --  182*  --   --  103* 95 104*  BUN 12  --  16  --   --  9 6 <5*  CREATININE 1.18*  --  0.90  --   --  0.71 0.90 0.87  CALCIUM 9.9  --   --   --   --  8.9 9.2 9.2  MG  --   --    --  2.6*  --  1.9  --   --   PHOS  --   --   --   --   --  3.9  --   --    < > = values in this interval not displayed.   GFR: Estimated Creatinine Clearance: 74 mL/min (by C-G formula based on SCr of 0.87 mg/dL). Liver Function Tests: Recent Labs  Lab 08/04/20 1936  AST 48*  ALT 52*  ALKPHOS 119  BILITOT 0.8  PROT 7.9  ALBUMIN 4.4   No results for input(s): LIPASE, AMYLASE in the last 168 hours. No results for input(s): AMMONIA in the last 168 hours. Coagulation Profile: No results for input(s): INR, PROTIME in the last 168 hours. Cardiac Enzymes: No results for input(s): CKTOTAL, CKMB, CKMBINDEX, TROPONINI in the last 168 hours. BNP (last 3 results) No results for input(s): PROBNP in the last 8760 hours. HbA1C: No results for input(s): HGBA1C in the last 72 hours. CBG: Recent Labs  Lab 08/06/20 1810 08/06/20 2029 08/07/20 0049 08/07/20 0434 08/07/20 0807  GLUCAP 89 76 70 99 126*   Lipid Profile: Recent Labs    08/06/20 0259 08/07/20 0500  TRIG 338* 189*   Thyroid Function Tests: No results for input(s): TSH, T4TOTAL, FREET4, T3FREE, THYROIDAB in the last 72 hours. Anemia Panel: No results for input(s): VITAMINB12, FOLATE, FERRITIN, TIBC, IRON, RETICCTPCT in the last 72 hours. Urine analysis:    Component Value Date/Time   COLORURINE STRAW (A) 08/04/2020 2155   APPEARANCEUR CLEAR 08/04/2020 2155   LABSPEC 1.008 08/04/2020 2155   PHURINE 6.0 08/04/2020 2155   GLUCOSEU NEGATIVE 08/04/2020 2155   HGBUR NEGATIVE 08/04/2020 2155   BILIRUBINUR NEGATIVE 08/04/2020 2155   KETONESUR NEGATIVE 08/04/2020 2155   PROTEINUR NEGATIVE 08/04/2020 2155   UROBILINOGEN 0.2 06/22/2015 1208   NITRITE NEGATIVE 08/04/2020 2155   LEUKOCYTESUR NEGATIVE 08/04/2020 2155   Sepsis Labs: @LABRCNTIP (procalcitonin:4,lacticidven:4)  ) Recent Results (from the past 240 hour(s))  Respiratory Panel by RT PCR (Flu A&B, Covid) - Nasopharyngeal Swab     Status: None   Collection Time:  08/04/20  7:37 PM   Specimen: Nasopharyngeal Swab  Result Value Ref Range Status   SARS Coronavirus 2 by RT PCR NEGATIVE NEGATIVE Final    Comment: (NOTE) SARS-CoV-2 target nucleic  acids are NOT DETECTED.  The SARS-CoV-2 RNA is generally detectable in upper respiratoy specimens during the acute phase of infection. The lowest concentration of SARS-CoV-2 viral copies this assay can detect is 131 copies/mL. A negative result does not preclude SARS-Cov-2 infection and should not be used as the sole basis for treatment or other patient management decisions. A negative result may occur with  improper specimen collection/handling, submission of specimen other than nasopharyngeal swab, presence of viral mutation(s) within the areas targeted by this assay, and inadequate number of viral copies (<131 copies/mL). A negative result must be combined with clinical observations, patient history, and epidemiological information. The expected result is Negative.  Fact Sheet for Patients:  https://www.moore.com/  Fact Sheet for Healthcare Providers:  https://www.young.biz/  This test is no t yet approved or cleared by the [place] FDA and  has been authorized for detection and/or diagnosis of SARS-CoV-2 by FDA under an Emergency Use Authorization (EUA). This EUA will remain  in effect (meaning this test can be used) for the duration of the COVID-19 declaration under Section 564(b)(1) of the Act, 21 U.S.C. section 360bbb-3(b)(1), unless the authorization is terminated or revoked sooner.     Influenza A by PCR NEGATIVE NEGATIVE Final   Influenza B by PCR NEGATIVE NEGATIVE Final    Comment: (NOTE) The Xpert Xpress SARS-CoV-2/FLU/RSV assay is intended as an aid in  the diagnosis of influenza from Nasopharyngeal swab specimens and  should not be used as a sole basis for treatment. Nasal washings and  aspirates are unacceptable for Xpert Xpress  SARS-CoV-2/FLU/RSV  testing.  Fact Sheet for Patients: https://www.moore.com/  Fact Sheet for Healthcare Providers: https://www.young.biz/  This test is not yet approved or cleared by the [place] FDA and  has been authorized for detection and/or diagnosis of SARS-CoV-2 by  FDA under an Emergency Use Authorization (EUA). This EUA will remain  in effect (meaning this test can be used) for the duration of the  Covid-19 declaration under Section 564(b)(1) of the Act, 21  U.S.C. section 360bbb-3(b)(1), unless the authorization is  terminated or revoked. Performed at [facility], [address], [place], [place] [address]   MRSA PCR Screening     Status: None   Collection Time: 08/05/20 12:56 AM   Specimen: Nasopharyngeal  Result Value Ref Range Status   MRSA by PCR NEGATIVE NEGATIVE Final    Comment:        The GeneXpert MRSA Assay (FDA approved for NASAL specimens only), is one component of a comprehensive MRSA colonization surveillance program. It is not intended to diagnose MRSA infection nor to guide or monitor treatment for MRSA infections. Performed at [facility], [address] [place], [place] [address]          Radiology Studies: DG CHEST PORT 1 VIEW  Result Date: 08/07/2020 CLINICAL DATA:  Wheezing EXAM: PORTABLE CHEST 1 VIEW COMPARISON:  August 05, 2020 FINDINGS: There is a right-sided central venous catheter with tip likely terminating in the right atrium. There is no right-sided pneumothorax. The heart size is stable. Atelectasis is noted at the lung bases. There is no large pleural effusion. IMPRESSION: 1. Lines and tubes as above. 2. No pneumothorax. 3. Bibasilar atelectasis. Electronically Signed   By: Katherine Mantle M.D.   On: 08/07/2020 02:01        Scheduled Meds: . Chlorhexidine Gluconate Cloth  6 each Topical Q0600  . docusate sodium  100 mg Oral BID  . enoxaparin (LOVENOX)  injection  40 mg Subcutaneous Q24H  . FLUoxetine  40 mg Oral Daily  . gabapentin  300 mg Oral BID  . mouth rinse  15 mL Mouth Rinse BID  . mirtazapine  30 mg Oral QHS  . nicotine  14 mg Transdermal Daily  . pantoprazole  40 mg Oral QHS  . polyethylene glycol  17 g Oral Daily  . sodium chloride flush  10-40 mL Intracatheter Q12H   Continuous Infusions: . sodium chloride 125 mL/hr at 08/08/20 0546     LOS: 4 days    Time spent: 35min  Zannie CovePreetha Elica Almas, MD [facility] 08/08/2020, 11:43 AM

## 2020-08-08 NOTE — TOC Progression Note (Signed)
Transition of Care [facility]) - Progression Note    Patient Details  Name: Margaret Mccann MRN: [id number] Date of Birth: 01/21/1971  [facility]) CM/SW Contact  Carley Hammed, [place] Phone Number: 08/08/2020, 4:40 PM  Clinical Narrative:    CSW spoke with pt and family was present over phone. She was advised that the recommendation is for her to receive inpatient psych treatment. Pt was upset and was still adamant that she wanted outpatient treatment. She has been seeing a therapist at [facility]. A call was put in to them, but will need to follow up on Monday. We discussed concerns and why it was important for her to get inpatient help. We talked about her safety and her family also noted that they also want her to be safe. After some conversation she agreed to inpatient with some reservations.  She would like for her [place] team to be a part of her treatment as she trusts her therapist and finds comfort in their sessions. She doe not want to go to [facility]. She visited her sister there when her sister attempted suicide, Her sister passed there and that is traumatic for her. She also has a mental health therapy dog that she would like to have visit with her, as well as her parents. She was advised that visitation policies vary by facility.  CSW let the family know that her information would be faxed out to several facilities to get her a be. Therapeutic Listening and comfort was given. It was also impressed upon her that we would prefer her go voluntary, but felt it would be necessary to send her involuntarily if necessary. She was asked if she would like to speak with a chaplain and she said she would, they were consulted. Faxes were sent, CSW will continue to follow.    Expected Discharge Plan: Psychiatric Hospital Barriers to Discharge: Continued Medical Work up  Expected Discharge Plan and Services Expected Discharge Plan: Psychiatric Hospital In-house  Referral: Clinical Social Work Discharge Planning Services: CM Consult   Living arrangements for the past 2 months: Single Family Home                                       Social Determinants of Health (SDOH) Interventions    Readmission Risk Interventions No flowsheet data found.

## 2020-08-08 NOTE — Progress Notes (Addendum)
   08/08/20 1940  Provider Notification  Provider Name/Title Dr. Loney Loh  Date Provider Notified 08/08/20  Time Provider Notified 1940  Notification Type Page  Notification Reason Requested by patient/family (pt requested stronger meds for coughing and throat pain)  Response Other (Comment) (waiting on DR's order.)   Update: DR. Loney Loh ordered tessalon pearls.

## 2020-08-09 DIAGNOSIS — F332 Major depressive disorder, recurrent severe without psychotic features: Secondary | ICD-10-CM

## 2020-08-09 MED ORDER — ACETAMINOPHEN 325 MG PO TABS
650.0000 mg | ORAL_TABLET | Freq: Four times a day (QID) | ORAL | Status: DC | PRN
  Administered 2020-08-09 – 2020-08-11 (×5): 650 mg via ORAL
  Filled 2020-08-09 (×4): qty 2

## 2020-08-09 NOTE — TOC Progression Note (Signed)
Transition of Care [facility]) - Progression Note    Patient Details  Name: Katasha Riga Lynes MRN: [id number] Date of Birth: 02-08-1971  Transition of Care [facility]) CM/SW [place], [place] Phone Number: 08/09/2020, 4:31 PM  Clinical Narrative:    CSW and Lorriane Shire (CSW) met with pt and mother at bedside as CSW will be transitioning off of the floor. Pt was advised that there are several places that have her information and that she will go to the first place available. This information was given to the pt so she could research some of the places. They were advised that [facility] has been the most interested and SW will follow up with them in the morning.Pt and mother noted understanding and questions were answered. Lorriane Shire will take over from here and SW will continue to follow.  Expected Discharge Plan: Psychiatric Hospital Barriers to Discharge: Continued Medical Work up  Expected Discharge Plan and Services Expected Discharge Plan: [facility] In-house Referral: Clinical Social Work Discharge Planning Services: CM Consult   Living arrangements for the past 2 months: Single Family Home                                       Social Determinants of Health (SDOH) Interventions    Readmission Risk Interventions No flowsheet data found.

## 2020-08-09 NOTE — Progress Notes (Signed)
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 -On 11/10 mother found her minimally responsive in bed with empty bottles, when EMS arrived O2 sats were in the 40s, heart rate was 28, she was started on epi, transdermally paced and brought to the ED where she was intubated, subsequently transitioned from epinephrine to dopamine infusion and admitted to ICU. -She was extubated 11/12, subsequently had some stridor and was treated with dexamethasone -Seen by psych 11/13 recommended inpatient psych, IVC if needed and sitter -11/14 transferred to [facility] service -Medically stable awaiting inpatient psych bed   Assessment & Plan:   Intentional overdose of clonidine and clonazepam Toxic encephalopathy Acute hypoxic respiratory failure -Extubated 11/12, continue incentive spirometry -Wean off O2, treated with Decadron x3 doses for stridor following extubation -Incentive spirometry -Seen by psychiatry, recommended inpatient psych, one-to-one sitter and IVC if needed -Also restarted Prozac gabapentin -Social work consulted and following, patient is medically stable for discharge to inpatient psych  Polysubstance abuse History of methamphetamine and cocaine use -UDS positive for benzodiazepine and cocaine on admission  DVT prophylaxis: Lovenox Code Status: Full code Family Communication: Mother at bedside Disposition Plan:  Status is: Inpatient  Remains inpatient appropriate because:Inpatient level of care appropriate due to severity of illness   Dispo: The patient is from: Home              Anticipated d/c is to: Inpatient psych              Anticipated d/c date  is: When bed available              Patient currently is medically stable to d/c.  Consultants:   Psych, PCCM transfer   Procedures:   Antimicrobials:    Subjective: -Was fast asleep when I walked and then slowly woke up, seems agreeable to go to inpatient psych at this time  Objective: Vitals:   08/08/20 1300 08/08/20 2038 08/09/20 0500 08/09/20 0510  BP:  (!) 147/98  (!) 152/94  Pulse:  74  75  Resp:  16  18  Temp:  99 F (37.2 C)  98.5 F (36.9 C)  TempSrc:  Oral  Oral  SpO2: 96% 96%  97%  Weight:  70.5 kg 70.5 kg   Height:        Intake/Output Summary (Last 24 hours) at 08/09/2020 1413 Last data filed at 08/08/2020 2118 Gross per 24 hour  Intake 1078.41 ml  Output --  Net 1078.41 ml   Filed Weights   08/08/20 0500 08/08/20 2038 08/09/20 0500  Weight: 67.8 kg 70.5 kg 70.5 kg    Examination:  General exam: Awake alert oriented x3, no distress CVS: S1-S2, regular rate rhythm Lungs: Decreased breath sounds the bases Abdomen: Soft, nontender, bowel sounds present Extremities: No edema Psych: Flat affect     Data Reviewed:   CBC: Recent Labs  Lab 08/04/20 1936 08/04/20 1936 08/04/20 1937 08/04/20 2126 08/05/20 0344 08/06/20 0259 08/07/20 0500  WBC 11.0*  --   --   --  13.7* 7.0 8.9  NEUTROABS 3.4  --   --   --   --   --   --   HGB 14.9   < > 16.0* 15.0  14.1 12.4 11.8*  HCT 46.1*   < > 47.0* 44.0 43.3 37.6 36.7  MCV 95.8  --   --   --  96.0 93.8 96.1  PLT 391  --   --   --  271 220 208   < > = values in this interval not displayed.   Basic Metabolic Panel: Recent Labs  Lab 08/04/20 1936 08/04/20 1936 08/04/20 1937 08/04/20 1945 08/04/20 2126 08/05/20 0344 08/06/20 0259 08/07/20 0500  NA 139   < > 141  --  141 139 139 140  K 2.7*   < > 2.7*  --  3.5 3.9 3.8 3.6  CL 102  --  105  --   --  108 104 102  CO2 19*  --   --   --   --  19* 24 25  GLUCOSE 187*  --  182*  --   --  103* 95 104*  BUN 12  --  16  --   --  9 6 <5*  CREATININE  1.18*  --  0.90  --   --  0.71 0.90 0.87  CALCIUM 9.9  --   --   --   --  8.9 9.2 9.2  MG  --   --   --  2.6*  --  1.9  --   --   PHOS  --   --   --   --   --  3.9  --   --    < > = values in this interval not displayed.   GFR: Estimated Creatinine Clearance: 75.2 mL/min (by C-G formula based on SCr of 0.87 mg/dL). Liver Function Tests: Recent Labs  Lab 08/04/20 1936  AST 48*  ALT 52*  ALKPHOS 119  BILITOT 0.8  PROT 7.9  ALBUMIN 4.4   No results for input(s): LIPASE, AMYLASE in the last 168 hours. No results for input(s): AMMONIA in the last 168 hours. Coagulation Profile: No results for input(s): INR, PROTIME in the last 168 hours. Cardiac Enzymes: No results for input(s): CKTOTAL, CKMB, CKMBINDEX, TROPONINI in the last 168 hours. BNP (last 3 results) No results for input(s): PROBNP in the last 8760 hours. HbA1C: No results for input(s): HGBA1C in the last 72 hours. CBG: Recent Labs  Lab 08/06/20 1810 08/06/20 2029 08/07/20 0049 08/07/20 0434 08/07/20 0807  GLUCAP 89 76 70 99 126*   Lipid Profile: Recent Labs    08/07/20 0500  TRIG 189*   Thyroid Function Tests: No results for input(s): TSH, T4TOTAL, FREET4, T3FREE, THYROIDAB in the last 72 hours. Anemia Panel: No results for input(s): VITAMINB12, FOLATE, FERRITIN, TIBC, IRON, RETICCTPCT in the last 72 hours. Urine analysis:    Component Value Date/Time   COLORURINE STRAW (A) 08/04/2020 2155   APPEARANCEUR CLEAR 08/04/2020 2155   LABSPEC 1.008 08/04/2020 2155   PHURINE 6.0 08/04/2020 2155   GLUCOSEU NEGATIVE 08/04/2020 2155   HGBUR NEGATIVE 08/04/2020 2155   BILIRUBINUR NEGATIVE 08/04/2020 2155   KETONESUR NEGATIVE 08/04/2020 2155   PROTEINUR NEGATIVE 08/04/2020 2155   UROBILINOGEN 0.2 06/22/2015 1208   NITRITE NEGATIVE 08/04/2020 2155   LEUKOCYTESUR NEGATIVE 08/04/2020 2155   Sepsis Labs: @LABRCNTIP (procalcitonin:4,lacticidven:4)  ) Recent Results (from the past 240 hour(s))  Respiratory Panel  by RT PCR (Flu A&B, Covid) - Nasopharyngeal Swab     Status: None   Collection Time: 08/04/20  7:37 PM   Specimen: Nasopharyngeal Swab  Result Value Ref Range Status   SARS Coronavirus 2 by  RT PCR NEGATIVE NEGATIVE Final    Comment: (NOTE) SARS-CoV-2 target nucleic acids are NOT DETECTED.  The SARS-CoV-2 RNA is generally detectable in upper respiratoy specimens during the acute phase of infection. The lowest concentration of SARS-CoV-2 viral copies this assay can detect is 131 copies/mL. A negative result does not preclude SARS-Cov-2 infection and should not be used as the sole basis for treatment or other patient management decisions. A negative result may occur with  improper specimen collection/handling, submission of specimen other than nasopharyngeal swab, presence of viral mutation(s) within the areas targeted by this assay, and inadequate number of viral copies (<131 copies/mL). A negative result must be combined with clinical observations, patient history, and epidemiological information. The expected result is Negative.  Fact Sheet for Patients:  https://www.moore.com/https://www.fda.gov/media/142436/download  Fact Sheet for Healthcare Providers:  https://www.young.biz/https://www.fda.gov/media/142435/download  This test is no t yet approved or cleared by the [place] FDA and  has been authorized for detection and/or diagnosis of SARS-CoV-2 by FDA under an Emergency Use Authorization (EUA). This EUA will remain  in effect (meaning this test can be used) for the duration of the COVID-19 declaration under Section 564(b)(1) of the Act, 21 U.S.C. section 360bbb-3(b)(1), unless the authorization is terminated or revoked sooner.     Influenza A by PCR NEGATIVE NEGATIVE Final   Influenza B by PCR NEGATIVE NEGATIVE Final    Comment: (NOTE) The Xpert Xpress SARS-CoV-2/FLU/RSV assay is intended as an aid in  the diagnosis of influenza from Nasopharyngeal swab specimens and  should not be used as a sole basis for  treatment. Nasal washings and  aspirates are unacceptable for Xpert Xpress SARS-CoV-2/FLU/RSV  testing.  Fact Sheet for Patients: https://www.moore.com/https://www.fda.gov/media/142436/download  Fact Sheet for Healthcare Providers: https://www.young.biz/https://www.fda.gov/media/142435/download  This test is not yet approved or cleared by the [place] FDA and  has been authorized for detection and/or diagnosis of SARS-CoV-2 by  FDA under an Emergency Use Authorization (EUA). This EUA will remain  in effect (meaning this test can be used) for the duration of the  Covid-19 declaration under Section 564(b)(1) of the Act, 21  U.S.C. section 360bbb-3(b)(1), unless the authorization is  terminated or revoked. Performed at [facility], [address] [place], [place] [address]   MRSA PCR Screening     Status: None   Collection Time: 08/05/20 12:56 AM   Specimen: Nasopharyngeal  Result Value Ref Range Status   MRSA by PCR NEGATIVE NEGATIVE Final    Comment:        The GeneXpert MRSA Assay (FDA approved for NASAL specimens only), is one component of a comprehensive MRSA colonization surveillance program. It is not intended to diagnose MRSA infection nor to guide or monitor treatment for MRSA infections. Performed at [facility], [address] [place], [place] [address]          Radiology Studies: No results found.      Scheduled Meds: . Chlorhexidine Gluconate Cloth  6 each Topical Q0600  . docusate sodium  100 mg Oral BID  . enoxaparin (LOVENOX) injection  40 mg Subcutaneous Q24H  . FLUoxetine  40 mg Oral Daily  . gabapentin  300 mg Oral BID  . guaiFENesin  600 mg Oral BID  . mouth rinse  15 mL Mouth Rinse BID  . mirtazapine  30 mg Oral QHS  . nicotine  14 mg Transdermal Daily  . pantoprazole  40 mg Oral QHS  . polyethylene glycol  17 g Oral Daily  .  sodium chloride flush  10-40 mL Intracatheter Q12H   Continuous Infusions:    LOS: 5 days    Time spent:  Zannie Cove, MD [facility] 08/09/2020, 2:13 PM

## 2020-08-09 NOTE — Progress Notes (Signed)
Pt ate about 75% of her breakfast this morning, pt tolerating po well.  Pt had some anxiety this morning requesting her prn klonopin. Denied any plan to harm herself or others at this time. Suicide safety sitter at pt's bedside.

## 2020-08-09 NOTE — Plan of Care (Signed)
  Problem: Skin Integrity: Goal: Risk for impaired skin integrity will decrease Outcome: Progressing   Problem: Safety: Goal: Ability to remain free from injury will improve Outcome: Progressing   Problem: Pain Managment: Goal: General experience of comfort will improve Outcome: Progressing   Problem: Coping: Goal: Level of anxiety will decrease Outcome: Progressing   

## 2020-08-10 MED ORDER — ACETAMINOPHEN 325 MG PO TABS: 650.0000 mg | ORAL_TABLET | Freq: Four times a day (QID) | ORAL | Status: DC | PRN

## 2020-08-10 MED ORDER — GABAPENTIN 300 MG PO CAPS
600.0000 mg | ORAL_CAPSULE | Freq: Two times a day (BID) | ORAL | Status: DC
  Administered 2020-08-10 – 2020-08-11 (×2): 600 mg via ORAL
  Filled 2020-08-10 (×2): qty 2

## 2020-08-10 MED ORDER — GABAPENTIN 300 MG PO CAPS
300.0000 mg | ORAL_CAPSULE | Freq: Two times a day (BID) | ORAL | Status: DC
Start: 2020-08-10 — End: 2020-08-11

## 2020-08-10 NOTE — Plan of Care (Signed)
  Problem: Education: Goal: Knowledge of General Education information will improve Description Including pain rating scale, medication(s)/side effects and non-pharmacologic comfort measures Outcome: Progressing   

## 2020-08-10 NOTE — TOC Progression Note (Signed)
Transition of Care [facility]) - Progression Note    Patient Details  Name: Margaret Mccann MRN: [id number] Date of Birth: [birth year]  [facility]) CM/SW Contact  Margaret Dupre Lazaro Arms, LCSW Phone Number: 08/10/2020, 4:56 PM  Clinical Narrative:  CSW contacted [facility] [phone]; press 2) and spoke with [facility] with patient logistics. She indicated that they are at capacity right now so will have to decline, however we can continue to check with them regarding bed availability.    2:39 pm: Talked with patient's mother ( who advised that her daughter initially refused psych placement in [place], however she is now agreeable to [facility]. Advised mother that [facility] will be contacted and also [facility] if needed.   4:43 pm: Secure chat with Margaret Mccann to make a referral for [facility].  CSW will continue to follow and provide SW intervention services as needed.    Expected Discharge Plan: Psychiatric Hospital Barriers to Discharge: Continued Medical Work up  Expected Discharge Plan and Services Expected Discharge Plan: Psychiatric Hospital In-house Referral: Clinical Social Work Discharge Planning Services: CM Consult   Living arrangements for the past 2 months: Single Family Home Expected Discharge Date: 08/10/20                                     Social Determinants of Health (SDOH) Interventions    Readmission Risk Interventions No flowsheet data found.

## 2020-08-10 NOTE — Discharge Summary (Addendum)
Physician Discharge Summary  Margaret Mccann TDD:[id number] DOB: 1971/03/23 DOA: 08/04/2020  PCP: Patient, No Pcp Per  Admit date: 08/04/2020 Discharge date: 08/11/2020  Time spent: 35 minutes  Recommendations for Outpatient Follow-up:  Inpatient psych  Discharge Diagnoses:  Principal Problem:   Intentional drug overdose (HCC) Major depression Anxiety Bipolar disorder   MDD (major depressive disorder), recurrent episode, severe (HCC)   Discharge Condition: Stable  Diet recommendation: Regular  Filed Weights   08/08/20 0500 08/08/20 2038 08/09/20 0500  Weight: 67.8 kg 70.5 kg 70.5 kg    History of present illness:  49/F with depression, anxiety, bipolar disorder, 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 -On 11/10 mother found her minimally responsive in bed with empty bottles, when EMS arrived O2 sats were in the 40s, heart rate was 28, she was started on epi, transdermally paced and brought to the ED where she was intubated, subsequently transitioned from epinephrine to dopamine infusion and admitted to ICU. -She was extubated 11/12, subsequently had some stridor and was treated with dexamethasone -Seen by psych 11/13 recommended inpatient psych, IVC if needed and sitter -11/14 transferred to [facility] Course:   Intentional overdose of clonidine and clonazepam Toxic encephalopathy Acute hypoxic respiratory failure -Treated with dopamine infusion on admission, required mechanical ventilation -Extubated 11/12,  weaned off oxygen -treated with Decadron x3 doses for stridor following extubation -Mental status back to baseline -Seen by psychiatry, recommended inpatient psych, one-to-one sitter and IVC if needed -Also restarted Prozac gabapentin -Social work consulted and following, patient is  medically stable for discharge to inpatient psych   Polysubstance abuse History of methamphetamine and cocaine use -UDS positive for benzodiazepine and cocaine on admission  Discharge Exam: Vitals:   08/09/20 1645 08/10/20 1025  BP: (!) 149/101 (!) 157/122  Pulse: 86 91  Resp: 18 20  Temp: 99 F (37.2 C) 98.7 F (37.1 C)  SpO2: 97% 97%    General: AAOx3, no distress Cardiovascular: S1S2/RRR Respiratory: CTAB  Discharge Instructions    Allergies as of 08/10/2020      Reactions   Lamictal [lamotrigine] Anaphylaxis, Rash, Other (See Comments)   Stevens-Johnson syndrome and fevers, also   Lamictal [lamotrigine] Anaphylaxis, Rash, Other (See Comments)   Fevers and Stevens-Johnson syndrome, also   Tramadol Other (See Comments)   Pt had a seizure after taking Tramadol!!   Sulfamethoxazole-trimethoprim Rash   Erythromycin Nausea And Vomiting   Erythromycin Base Nausea And Vomiting   Levetiracetam Anxiety   Sulfa Antibiotics Rash      Medication List    STOP taking these medications   clonazePAM 0.5 MG tablet Commonly known as: KlonoPIN   cloNIDine 0.2 MG tablet Commonly known as: CATAPRES   gabapentin 600 MG tablet Commonly known as: NEURONTIN Replaced by: gabapentin 300 MG capsule     TAKE these medications   acetaminophen 325 MG tablet Commonly known as: TYLENOL Take 2 tablets (650 mg total) by mouth every 6 (six) hours as needed for fever.   FLUoxetine 40 MG capsule Commonly known as: PROZAC Take 1 capsule (40 mg total) by mouth daily.   gabapentin 300 MG capsule Commonly known as: NEURONTIN Take 1 capsule (300 mg total) by mouth 2 (two) times daily. Replaces: gabapentin 600 MG tablet   mirtazapine 30 MG tablet Commonly known as: REMERON Take 1 tablet (30 mg total) by mouth at bedtime.  Allergies  Allergen Reactions  . Lamictal [Lamotrigine] Anaphylaxis, Rash and Other (See Comments)    Stevens-Johnson syndrome and fevers, also  .  Lamictal [Lamotrigine] Anaphylaxis, Rash and Other (See Comments)    Fevers and Stevens-Johnson syndrome, also  . Tramadol Other (See Comments)    Pt had a seizure after taking Tramadol!!  . Sulfamethoxazole-Trimethoprim Rash  . Erythromycin Nausea And Vomiting  . Erythromycin Base Nausea And Vomiting  . Levetiracetam Anxiety  . Sulfa Antibiotics Rash    Follow-up Information    [facility] Follow up.   Contact information: [address] [place] [address] [phone]               The results of significant diagnostics from this hospitalization (including imaging, microbiology, ancillary and laboratory) are listed below for reference.    Significant Diagnostic Studies: DG CHEST PORT 1 VIEW  Result Date: 08/07/2020 CLINICAL DATA:  Wheezing EXAM: PORTABLE CHEST 1 VIEW COMPARISON:  August 05, 2020 FINDINGS: There is a right-sided central venous catheter with tip likely terminating in the right atrium. There is no right-sided pneumothorax. The heart size is stable. Atelectasis is noted at the lung bases. There is no large pleural effusion. IMPRESSION: 1. Lines and tubes as above. 2. No pneumothorax. 3. Bibasilar atelectasis. Electronically Signed   By: Katherine Mantle M.D.   On: 08/07/2020 02:01   DG Chest Port 1 View  Result Date: 08/05/2020 CLINICAL DATA:  Intubation EXAM: PORTABLE CHEST 1 VIEW COMPARISON:  Yesterday FINDINGS: Endotracheal tube with tip at the clavicular heads. The enteric tube tip and side-port reaches the stomach. Right IJ line with tip at the right atrium. Extensive artifact from EKG leads and defibrillator pads. Lungs remain clear in appearance. Normal heart size accounting for rotation. No pneumothorax. IMPRESSION: 1. Right IJ line with tip at the lower right atrium. Otherwise unremarkable hardware. 2. No evidence of active cardiopulmonary disease. Electronically Signed   By: Marnee Spring M.D.   On:  08/05/2020 05:51   DG Chest Portable 1 View  Result Date: 08/04/2020 CLINICAL DATA:  Intubation EXAM: PORTABLE CHEST 1 VIEW COMPARISON:  11/17/2019 FINDINGS: Support Apparatus: --Endotracheal tube: Tip at the level of the clavicular heads. --Enteric tube:Tip and sideport are below the field of view. --Catheter(s):Right internal jugular vein approach central venous catheter tip is in the proximal right atrium --Other: None The heart size and mediastinal contours are within normal limits. The lungs are clear. No pleural effusion or pneumothorax. IMPRESSION: 1. Endotracheal tube tip at the level of the clavicular heads. 2. Right internal jugular vein approach central venous catheter tip in the proximal right atrium. Electronically Signed   By: Deatra Robinson M.D.   On: 08/04/2020 20:30    Microbiology: Recent Results (from the past 240 hour(s))  Respiratory Panel by RT PCR (Flu A&B, Covid) - Nasopharyngeal Swab     Status: None   Collection Time: 08/04/20  7:37 PM   Specimen: Nasopharyngeal Swab  Result Value Ref Range Status   SARS Coronavirus 2 by RT PCR NEGATIVE NEGATIVE Final    Comment: (NOTE) SARS-CoV-2 target nucleic acids are NOT DETECTED.  The SARS-CoV-2 RNA is generally detectable in upper respiratoy specimens during the acute phase of infection. The lowest concentration of SARS-CoV-2 viral copies this assay can detect is 131 copies/mL. A negative result does not preclude SARS-Cov-2 infection and should not be used as the sole basis for treatment or other patient management decisions. A negative result  may occur with  improper specimen collection/handling, submission of specimen other than nasopharyngeal swab, presence of viral mutation(s) within the areas targeted by this assay, and inadequate number of viral copies (<131 copies/mL). A negative result must be combined with clinical observations, patient history, and epidemiological information. The expected result is  Negative.  Fact Sheet for Patients:  https://www.moore.com/  Fact Sheet for Healthcare Providers:  https://www.young.biz/  This test is no t yet approved or cleared by the [place] FDA and  has been authorized for detection and/or diagnosis of SARS-CoV-2 by FDA under an Emergency Use Authorization (EUA). This EUA will remain  in effect (meaning this test can be used) for the duration of the COVID-19 declaration under Section 564(b)(1) of the Act, 21 U.S.C. section 360bbb-3(b)(1), unless the authorization is terminated or revoked sooner.     Influenza A by PCR NEGATIVE NEGATIVE Final   Influenza B by PCR NEGATIVE NEGATIVE Final    Comment: (NOTE) The Xpert Xpress SARS-CoV-2/FLU/RSV assay is intended as an aid in  the diagnosis of influenza from Nasopharyngeal swab specimens and  should not be used as a sole basis for treatment. Nasal washings and  aspirates are unacceptable for Xpert Xpress SARS-CoV-2/FLU/RSV  testing.  Fact Sheet for Patients: https://www.moore.com/  Fact Sheet for Healthcare Providers: https://www.young.biz/  This test is not yet approved or cleared by the [place] FDA and  has been authorized for detection and/or diagnosis of SARS-CoV-2 by  FDA under an Emergency Use Authorization (EUA). This EUA will remain  in effect (meaning this test can be used) for the duration of the  Covid-19 declaration under Section 564(b)(1) of the Act, 21  U.S.C. section 360bbb-3(b)(1), unless the authorization is  terminated or revoked. Performed at [facility], [address], [place], [place] [address]   MRSA PCR Screening     Status: None   Collection Time: 08/05/20 12:56 AM   Specimen: Nasopharyngeal  Result Value Ref Range Status   MRSA by PCR NEGATIVE NEGATIVE Final    Comment:        The GeneXpert MRSA Assay (FDA approved for NASAL specimens only), is one component of  a comprehensive MRSA colonization surveillance program. It is not intended to diagnose MRSA infection nor to guide or monitor treatment for MRSA infections. Performed at [facility], [address] [place], [place] [address]      Labs: Basic Metabolic Panel: Recent Labs  Lab 08/04/20 1936 08/04/20 1936 08/04/20 1937 08/04/20 1945 08/04/20 2126 08/05/20 0344 08/06/20 0259 08/07/20 0500  NA 139   < > 141  --  141 139 139 140  K 2.7*   < > 2.7*  --  3.5 3.9 3.8 3.6  CL 102  --  105  --   --  108 104 102  CO2 19*  --   --   --   --  19* 24 25  GLUCOSE 187*  --  182*  --   --  103* 95 104*  BUN 12  --  16  --   --  9 6 <5*  CREATININE 1.18*  --  0.90  --   --  0.71 0.90 0.87  CALCIUM 9.9  --   --   --   --  8.9 9.2 9.2  MG  --   --   --  2.6*  --  1.9  --   --   PHOS  --   --   --   --   --  3.9  --   --    < > = values in this interval not displayed.   Liver Function Tests: Recent Labs  Lab 08/04/20 1936  AST 48*  ALT 52*  ALKPHOS 119  BILITOT 0.8  PROT 7.9  ALBUMIN 4.4   No results for input(s): LIPASE, AMYLASE in the last 168 hours. No results for input(s): AMMONIA in the last 168 hours. CBC: Recent Labs  Lab 08/04/20 1936 08/04/20 1936 08/04/20 1937 08/04/20 2126 08/05/20 0344 08/06/20 0259 08/07/20 0500  WBC 11.0*  --   --   --  13.7* 7.0 8.9  NEUTROABS 3.4  --   --   --   --   --   --   HGB 14.9   < > 16.0* 15.0 14.1 12.4 11.8*  HCT 46.1*   < > 47.0* 44.0 43.3 37.6 36.7  MCV 95.8  --   --   --  96.0 93.8 96.1  PLT 391  --   --   --  271 220 208   < > = values in this interval not displayed.   Cardiac Enzymes: No results for input(s): CKTOTAL, CKMB, CKMBINDEX, TROPONINI in the last 168 hours. BNP: BNP (last 3 results) No results for input(s): BNP in the last 8760 hours.  ProBNP (last 3 results) No results for input(s): PROBNP in the last 8760 hours.  CBG: Recent Labs  Lab 08/06/20 1810 08/06/20 2029 08/07/20 0049 08/07/20 0434  08/07/20 0807  GLUCAP 89 76 70 99 126*       Signed:  Zannie CovePreetha Inioluwa Boulay MD.  [facility] 08/10/2020, 2:30 PM

## 2020-08-11 ENCOUNTER — Telehealth: Payer: Self-pay | Admitting: General Practice

## 2020-08-11 ENCOUNTER — Inpatient Hospital Stay (HOSPITAL_COMMUNITY)
Admission: AD | Admit: 2020-08-11 | Discharge: 2020-08-14 | DRG: 885 | Disposition: A | Discharge status: Home / Self Care | Payer: Federal, State, Local not specified - Other | Source: Intra-hospital | Attending: Psychiatry | Admitting: Psychiatry

## 2020-08-11 ENCOUNTER — Encounter (HOSPITAL_COMMUNITY): Payer: Self-pay | Admitting: Family

## 2020-08-11 ENCOUNTER — Other Ambulatory Visit: Payer: Self-pay

## 2020-08-11 DIAGNOSIS — Z823 Family history of stroke: Secondary | ICD-10-CM

## 2020-08-11 DIAGNOSIS — G4700 Insomnia, unspecified: Secondary | ICD-10-CM | POA: Diagnosis present

## 2020-08-11 DIAGNOSIS — F4321 Adjustment disorder with depressed mood: Secondary | ICD-10-CM | POA: Diagnosis present

## 2020-08-11 DIAGNOSIS — Z888 Allergy status to other drugs, medicaments and biological substances status: Secondary | ICD-10-CM | POA: Diagnosis not present

## 2020-08-11 DIAGNOSIS — J029 Acute pharyngitis, unspecified: Secondary | ICD-10-CM | POA: Diagnosis present

## 2020-08-11 DIAGNOSIS — F1010 Alcohol abuse, uncomplicated: Secondary | ICD-10-CM | POA: Diagnosis present

## 2020-08-11 DIAGNOSIS — Z9151 Personal history of suicidal behavior: Secondary | ICD-10-CM | POA: Diagnosis not present

## 2020-08-11 DIAGNOSIS — F1310 Sedative, hypnotic or anxiolytic abuse, uncomplicated: Secondary | ICD-10-CM | POA: Diagnosis present

## 2020-08-11 DIAGNOSIS — F339 Major depressive disorder, recurrent, unspecified: Secondary | ICD-10-CM | POA: Diagnosis present

## 2020-08-11 DIAGNOSIS — T50902A Poisoning by unspecified drugs, medicaments and biological substances, intentional self-harm, initial encounter: Secondary | ICD-10-CM

## 2020-08-11 DIAGNOSIS — F1910 Other psychoactive substance abuse, uncomplicated: Secondary | ICD-10-CM | POA: Diagnosis present

## 2020-08-11 DIAGNOSIS — Z882 Allergy status to sulfonamides status: Secondary | ICD-10-CM | POA: Diagnosis not present

## 2020-08-11 DIAGNOSIS — Z23 Encounter for immunization: Secondary | ICD-10-CM

## 2020-08-11 DIAGNOSIS — F411 Generalized anxiety disorder: Secondary | ICD-10-CM | POA: Diagnosis present

## 2020-08-11 DIAGNOSIS — F332 Major depressive disorder, recurrent severe without psychotic features: Principal | ICD-10-CM | POA: Diagnosis present

## 2020-08-11 DIAGNOSIS — F4310 Post-traumatic stress disorder, unspecified: Secondary | ICD-10-CM | POA: Diagnosis present

## 2020-08-11 DIAGNOSIS — Z8249 Family history of ischemic heart disease and other diseases of the circulatory system: Secondary | ICD-10-CM

## 2020-08-11 DIAGNOSIS — Z818 Family history of other mental and behavioral disorders: Secondary | ICD-10-CM | POA: Diagnosis not present

## 2020-08-11 DIAGNOSIS — Z87891 Personal history of nicotine dependence: Secondary | ICD-10-CM

## 2020-08-11 DIAGNOSIS — T1491XA Suicide attempt, initial encounter: Secondary | ICD-10-CM | POA: Diagnosis present

## 2020-08-11 DIAGNOSIS — F1410 Cocaine abuse, uncomplicated: Secondary | ICD-10-CM | POA: Diagnosis present

## 2020-08-11 HISTORY — DX: Poisoning by unspecified drugs, medicaments and biological substances, intentional self-harm, initial encounter: T50.902A

## 2020-08-11 HISTORY — DX: Major depressive disorder, recurrent, unspecified: F33.9

## 2020-08-11 MED ORDER — GABAPENTIN 400 MG PO CAPS
1200.0000 mg | ORAL_CAPSULE | Freq: Two times a day (BID) | ORAL | Status: DC
  Administered 2020-08-11 – 2020-08-13 (×4): 1200 mg via ORAL
  Filled 2020-08-11 (×9): qty 3

## 2020-08-11 MED ORDER — MIRTAZAPINE 30 MG PO TABS
30.0000 mg | ORAL_TABLET | Freq: Every day | ORAL | Status: DC
  Administered 2020-08-11 – 2020-08-13 (×3): 30 mg via ORAL
  Filled 2020-08-11 (×5): qty 1

## 2020-08-11 MED ORDER — ACETAMINOPHEN 325 MG PO TABS
650.0000 mg | ORAL_TABLET | Freq: Four times a day (QID) | ORAL | Status: DC | PRN
  Administered 2020-08-12 – 2020-08-14 (×6): 650 mg via ORAL
  Filled 2020-08-11 (×4): qty 2

## 2020-08-11 MED ORDER — CLONAZEPAM 0.5 MG PO TABS
0.5000 mg | ORAL_TABLET | Freq: Three times a day (TID) | ORAL | Status: DC | PRN
  Administered 2020-08-11 – 2020-08-12 (×2): 0.5 mg via ORAL
  Filled 2020-08-11 (×2): qty 1

## 2020-08-11 MED ORDER — MAGNESIUM HYDROXIDE 400 MG/5ML PO SUSP: 30.0000 mL | Freq: Every day | ORAL | Status: DC | PRN

## 2020-08-11 MED ORDER — HYDROXYZINE HCL 25 MG PO TABS
25.0000 mg | ORAL_TABLET | Freq: Three times a day (TID) | ORAL | Status: DC | PRN
  Administered 2020-08-11 – 2020-08-14 (×4): 25 mg via ORAL
  Filled 2020-08-11 (×4): qty 1

## 2020-08-11 MED ORDER — GABAPENTIN 400 MG PO CAPS
1200.0000 mg | ORAL_CAPSULE | Freq: Two times a day (BID) | ORAL | Status: DC
  Administered 2020-08-11: 1200 mg via ORAL
  Filled 2020-08-11: qty 3

## 2020-08-11 MED ORDER — MENTHOL 3 MG MT LOZG
1.0000 | LOZENGE | OROMUCOSAL | Status: DC | PRN
  Administered 2020-08-11 – 2020-08-14 (×11): 3 mg via ORAL
  Filled 2020-08-11 (×3): qty 9

## 2020-08-11 MED ORDER — FLUOXETINE HCL 20 MG PO CAPS
40.0000 mg | ORAL_CAPSULE | Freq: Every day | ORAL | Status: DC
  Administered 2020-08-12: 40 mg via ORAL
  Filled 2020-08-11 (×3): qty 2

## 2020-08-11 MED ORDER — CLONIDINE HCL 0.2 MG PO TABS
0.2000 mg | ORAL_TABLET | Freq: Every day | ORAL | Status: DC
  Administered 2020-08-11 – 2020-08-13 (×3): 0.2 mg via ORAL
  Filled 2020-08-11 (×3): qty 1
  Filled 2020-08-11: qty 2
  Filled 2020-08-11: qty 1

## 2020-08-11 MED ORDER — CLONIDINE HCL 0.2 MG PO TABS
0.2000 mg | ORAL_TABLET | Freq: Every day | ORAL | 0 refills | Status: DC
Start: 2020-08-11 — End: 2020-08-14

## 2020-08-11 MED ORDER — DOCUSATE SODIUM 100 MG PO CAPS
100.0000 mg | ORAL_CAPSULE | Freq: Two times a day (BID) | ORAL | Status: DC
  Administered 2020-08-12: 100 mg via ORAL
  Filled 2020-08-11 (×8): qty 1

## 2020-08-11 MED ORDER — GABAPENTIN 400 MG PO CAPS
1200.0000 mg | ORAL_CAPSULE | Freq: Two times a day (BID) | ORAL | 0 refills | Status: DC
Start: 2020-08-11 — End: 2020-08-14

## 2020-08-11 MED ORDER — ACETAMINOPHEN 325 MG PO TABS
650.0000 mg | ORAL_TABLET | Freq: Four times a day (QID) | ORAL | Status: DC | PRN
  Filled 2020-08-11 (×3): qty 2

## 2020-08-11 MED ORDER — LORAZEPAM 0.5 MG PO TABS
0.5000 mg | ORAL_TABLET | Freq: Once | ORAL | Status: AC
  Administered 2020-08-11: 0.5 mg via ORAL
  Filled 2020-08-11: qty 1

## 2020-08-11 MED ORDER — ALUM & MAG HYDROXIDE-SIMETH 200-200-20 MG/5ML PO SUSP: 30.0000 mL | ORAL | Status: DC | PRN

## 2020-08-11 MED ORDER — NICOTINE 14 MG/24HR TD PT24
14.0000 mg | MEDICATED_PATCH | Freq: Every day | TRANSDERMAL | Status: DC
  Administered 2020-08-12 – 2020-08-14 (×3): 14 mg via TRANSDERMAL
  Filled 2020-08-11 (×4): qty 1

## 2020-08-11 MED ORDER — PANTOPRAZOLE SODIUM 40 MG PO TBEC
40.0000 mg | DELAYED_RELEASE_TABLET | Freq: Every day | ORAL | Status: DC
  Administered 2020-08-11 – 2020-08-13 (×3): 40 mg via ORAL
  Filled 2020-08-11 (×5): qty 1

## 2020-08-11 MED ORDER — CLONIDINE HCL 0.2 MG PO TABS: 0.2000 mg | ORAL_TABLET | Freq: Every day | ORAL | Status: DC

## 2020-08-11 NOTE — Progress Notes (Signed)
RN gave report to receiving nurse Arlys John at [facility]. Patient transferred from the hospital with all patient belongings and free of lines and drains.

## 2020-08-11 NOTE — Tx Team (Signed)
Initial Treatment Plan 08/11/2020 6:44 PM Jackolyn Confer Smart QAE:[id number]    PATIENT STRESSORS: Loss of Significant other to suicide Substance abuse Traumatic event   PATIENT STRENGTHS: Ability for insight Capable of independent living Communication skills Supportive family/friends   PATIENT IDENTIFIED PROBLEMS: Status post cocaine and benzo OD resulting in intubation, polysubstance abuse  Grief; loss of her significant other  Depression  Anxiety               DISCHARGE CRITERIA:  Ability to meet basic life and health needs Improved stabilization in mood, thinking, and/or behavior Verbal commitment to aftercare and medication compliance  PRELIMINARY DISCHARGE PLAN: Attend aftercare/continuing care group Return to previous living arrangement  PATIENT/FAMILY INVOLVEMENT: This treatment plan has been presented to and reviewed with the patient, Margaret Mccann.  The patient and family have been given the opportunity to ask questions and make suggestions.  Ginger Carne, RN 08/11/2020, 6:44 PM

## 2020-08-11 NOTE — TOC Transition Note (Signed)
Transition of Care [facility]) - CM/SW Discharge Note *Discharged to [facility] *Room 401-2   Patient Details  Name: Margaret Mccann [id number] MRN: [id number] Date of Birth: Feb 15, 1971  Transition of Care [facility]) CM/SW Contact:  Margaret Goldmann, LCSW Phone Number: 08/11/2020, 2:20 PM   Clinical Narrative: Patient in need of psychiatric placement and contact made with [facility] today. CSW advised that they can accept patient. [name] agreeable to voluntary psychiatric placement. Patient reviewed and signed voluntary admission form. Patient will be transported to facility by [employer].    Final next level of care: Psychiatric Hospital [facility]; room 401-2) Barriers to Discharge: Barriers Resolved   Patient Goals and CMS Choice Patient states their goals for this hospitalization and ongoing recovery are:: Patient agreeable to psych placement and is discharging voluntarily CMS Medicare.gov Compare Post Acute Care list provided to:: Other (Comment Required) (Not needed; patient agreeable to [facility]) Choice offered to / list presented to : NA  Discharge Placement              Patient chooses bed at:  [facility]) Patient to be transferred to facility by: [facility] Name of family member notified: Patient will notify her mother Patient and family notified of of transfer: 08/11/20 (Patient contacted her mother)  Discharge Plan and Services In-house Referral: Clinical Social Work Discharge Planning Services: CM Consult                                Social Determinants of Health (SDOH) Interventions  Patient in need of psychiatric treatment before returning home   Readmission Risk Interventions No flowsheet data found.

## 2020-08-11 NOTE — Discharge Summary (Signed)
Physician Discharge Summary  Margaret Mccann HQI:[id number] DOB: 01-Dec-1970 DOA: 08/04/2020  PCP: Patient, No Pcp Per  Admit date: 08/04/2020 Discharge date: 08/11/2020  Admitted From: Home  Disposition: Behavioral health   Recommendations for Outpatient Follow-up:  1. Follow up with PCP in 1-2 weeks 2. Please obtain BMP/CBC in one week 3. Transfer to inpatient psych   Discharge Condition: Stable.  CODE STATUS: Full code Diet recommendation: Heart Healthy  Brief/Interim Summary: 49/Fwith depression, anxiety, bipolar disorder, Morgellonssyndrome on multiple psychotropic meds was brought to the ED following overdose on clonazepam and clonidine after being grief stricken since her boyfriend committed suicide 3 weeks prior. -On 11/10 mother found her minimally responsive in bed with empty bottles, when EMS arrived O2 sats were in the 40s, heart rate was 28, she was started on epi, transdermallypacedand brought to the ED where she was intubated, subsequently transitioned from epinephrine to dopamine infusion and admitted to ICU. -She was extubated 11/12, subsequently had some stridor and was treated with dexamethasone -Seen by psych 11/13 recommended inpatient psych, IVC if needed and sitter -11/14 transferred to [facility]   Intentional overdose of clonidine and clonazepam Toxic encephalopathy Acute hypoxic respiratory failure -Treated with dopamine infusion on admission, required mechanical ventilation -Extubated 11/12, weaned off oxygen -treated with Decadronx3doses for stridor following extubation -Mental status back to baseline -Seen by psychiatry, recommended inpatient psych, one-to-one sitter and IVC if needed -Also restarted Prozac. Gabapentin. Gabapentin dose increase to 1200 mg BID. Clonidine for PTSD resume today.  -Social work consulted and following, patient is medically stable for discharge to inpatient psych  -Transfer to [facility] today.   Polysubstance  abuse History of methamphetamine and cocaine use -UDS positive for benzodiazepine and cocaine on admission   Discharge Diagnoses:  Principal Problem:   Intentional drug overdose (HCC) Active Problems:   MDD (major depressive disorder), recurrent episode, severe (HCC)    Discharge Instructions  Discharge Instructions    Diet - low sodium heart healthy   Complete by: As directed    Increase activity slowly   Complete by: As directed      Allergies as of 08/11/2020      Reactions   Lamictal [lamotrigine] Anaphylaxis, Rash, Other (See Comments)   Stevens-Johnson syndrome and fevers, also   Lamictal [lamotrigine] Anaphylaxis, Rash, Other (See Comments)   Fevers and Stevens-Johnson syndrome, also   Tramadol Other (See Comments)   Pt had a seizure after taking Tramadol!!   Sulfamethoxazole-trimethoprim Rash   Erythromycin Nausea And Vomiting   Erythromycin Base Nausea And Vomiting   Levetiracetam Anxiety   Sulfa Antibiotics Rash      Medication List    STOP taking these medications   gabapentin 600 MG tablet Commonly known as: NEURONTIN Replaced by: gabapentin 400 MG capsule     TAKE these medications   acetaminophen 325 MG tablet Commonly known as: TYLENOL Take 2 tablets (650 mg total) by mouth every 6 (six) hours as needed for fever.   clonazePAM 0.5 MG tablet Commonly known as: KlonoPIN Take 1 tablet (0.5 mg total) by mouth 2 (two) times daily as needed for anxiety.   cloNIDine 0.2 MG tablet Commonly known as: CATAPRES Take 1 tablet (0.2 mg total) by mouth at bedtime.   FLUoxetine 40 MG capsule Commonly known as: PROZAC Take 1 capsule (40 mg total) by mouth daily.   gabapentin 400 MG capsule Commonly known as: NEURONTIN Take 3 capsules (1,200 mg total) by mouth 2 (two) times daily. Replaces: gabapentin 600 MG tablet  mirtazapine 30 MG tablet Commonly known as: REMERON Take 1 tablet (30 mg total) by mouth at bedtime.       Follow-up Information     [facility] Follow up.   Contact information: [address] [place] [phone]             Allergies  Allergen Reactions  . Lamictal [Lamotrigine] Anaphylaxis, Rash and Other (See Comments)    Stevens-Johnson syndrome and fevers, also  . Lamictal [Lamotrigine] Anaphylaxis, Rash and Other (See Comments)    Fevers and Stevens-Johnson syndrome, also  . Tramadol Other (See Comments)    Pt had a seizure after taking Tramadol!!  . Sulfamethoxazole-Trimethoprim Rash  . Erythromycin Nausea And Vomiting  . Erythromycin Base Nausea And Vomiting  . Levetiracetam Anxiety  . Sulfa Antibiotics Rash    Consultations: Psych.   Procedures/Studies: DG CHEST PORT 1 VIEW  Result Date: 08/07/2020 CLINICAL DATA:  Wheezing EXAM: PORTABLE CHEST 1 VIEW COMPARISON:  August 05, 2020 FINDINGS: There is a right-sided central venous catheter with tip likely terminating in the right atrium. There is no right-sided pneumothorax. The heart size is stable. Atelectasis is noted at the lung bases. There is no large pleural effusion. IMPRESSION: 1. Lines and tubes as above. 2. No pneumothorax. 3. Bibasilar atelectasis. Electronically Signed   By: Katherine Mantlehristopher  Green M.D.   On: 08/07/2020 02:01   DG Chest Port 1 View  Result Date: 08/05/2020 CLINICAL DATA:  Intubation EXAM: PORTABLE CHEST 1 VIEW COMPARISON:  Yesterday FINDINGS: Endotracheal tube with tip at the clavicular heads. The enteric tube tip and side-port reaches the stomach. Right IJ line with tip at the right atrium. Extensive artifact from EKG leads and defibrillator pads. Lungs remain clear in appearance. Normal heart size accounting for rotation. No pneumothorax. IMPRESSION: 1. Right IJ line with tip at the lower right atrium. Otherwise unremarkable hardware. 2. No evidence of active cardiopulmonary disease. Electronically Signed   By: Marnee SpringJonathon  Watts M.D.   On: 08/05/2020 05:51    DG Chest Portable 1 View  Result Date: 08/04/2020 CLINICAL DATA:  Intubation EXAM: PORTABLE CHEST 1 VIEW COMPARISON:  11/17/2019 FINDINGS: Support Apparatus: --Endotracheal tube: Tip at the level of the clavicular heads. --Enteric tube:Tip and sideport are below the field of view. --Catheter(s):Right internal jugular vein approach central venous catheter tip is in the proximal right atrium --Other: None The heart size and mediastinal contours are within normal limits. The lungs are clear. No pleural effusion or pneumothorax. IMPRESSION: 1. Endotracheal tube tip at the level of the clavicular heads. 2. Right internal jugular vein approach central venous catheter tip in the proximal right atrium. Electronically Signed   By: Deatra RobinsonKevin  Herman M.D.   On: 08/04/2020 20:30      Subjective: She has been having more anxiety. She would like her gabapentin dose to be increase to 1200 mg BID.  Needs her clonidine for PTSD  Discharge Exam: Vitals:   08/11/20 0547 08/11/20 0842  BP: (!) 123/98 (!) 134/95  Pulse: 78 93  Resp: 18 19  Temp: 98.5 F (36.9 C) 98.2 F (36.8 C)  SpO2: 98% 98%     General: Pt is alert, awake, not in acute distress Cardiovascular: RRR, S1/S2 +, no rubs, no gallops Respiratory: CTA bilaterally, no wheezing, no rhonchi Abdominal: Soft, NT, ND, bowel sounds + Extremities: no edema, no cyanosis    The results of significant diagnostics from this hospitalization (including imaging, microbiology, ancillary and laboratory) are  listed below for reference.     Microbiology: Recent Results (from the past 240 hour(s))  Respiratory Panel by RT PCR (Flu A&B, Covid) - Nasopharyngeal Swab     Status: None   Collection Time: 08/04/20  7:37 PM   Specimen: Nasopharyngeal Swab  Result Value Ref Range Status   SARS Coronavirus 2 by RT PCR NEGATIVE NEGATIVE Final    Comment: (NOTE) SARS-CoV-2 target nucleic acids are NOT DETECTED.  The SARS-CoV-2 RNA is generally detectable in  upper respiratoy specimens during the acute phase of infection. The lowest concentration of SARS-CoV-2 viral copies this assay can detect is 131 copies/mL. A negative result does not preclude SARS-Cov-2 infection and should not be used as the sole basis for treatment or other patient management decisions. A negative result may occur with  improper specimen collection/handling, submission of specimen other than nasopharyngeal swab, presence of viral mutation(s) within the areas targeted by this assay, and inadequate number of viral copies (<131 copies/mL). A negative result must be combined with clinical observations, patient history, and epidemiological information. The expected result is Negative.  Fact Sheet for Patients:  https://www.moore.com/  Fact Sheet for Healthcare Providers:  https://www.young.biz/  This test is no t yet approved or cleared by the [place] FDA and  has been authorized for detection and/or diagnosis of SARS-CoV-2 by FDA under an Emergency Use Authorization (EUA). This EUA will remain  in effect (meaning this test can be used) for the duration of the COVID-19 declaration under Section 564(b)(1) of the Act, 21 U.S.C. section 360bbb-3(b)(1), unless the authorization is terminated or revoked sooner.     Influenza A by PCR NEGATIVE NEGATIVE Final   Influenza B by PCR NEGATIVE NEGATIVE Final    Comment: (NOTE) The Xpert Xpress SARS-CoV-2/FLU/RSV assay is intended as an aid in  the diagnosis of influenza from Nasopharyngeal swab specimens and  should not be used as a sole basis for treatment. Nasal washings and  aspirates are unacceptable for Xpert Xpress SARS-CoV-2/FLU/RSV  testing.  Fact Sheet for Patients: https://www.moore.com/  Fact Sheet for Healthcare Providers: https://www.young.biz/  This test is not yet approved or cleared by the [place] FDA and  has been  authorized for detection and/or diagnosis of SARS-CoV-2 by  FDA under an Emergency Use Authorization (EUA). This EUA will remain  in effect (meaning this test can be used) for the duration of the  Covid-19 declaration under Section 564(b)(1) of the Act, 21  U.S.C. section 360bbb-3(b)(1), unless the authorization is  terminated or revoked. Performed at [facility], [address] [place], [place] [address]   MRSA PCR Screening     Status: None   Collection Time: 08/05/20 12:56 AM   Specimen: Nasopharyngeal  Result Value Ref Range Status   MRSA by PCR NEGATIVE NEGATIVE Final    Comment:        The GeneXpert MRSA Assay (FDA approved for NASAL specimens only), is one component of a comprehensive MRSA colonization surveillance program. It is not intended to diagnose MRSA infection nor to guide or monitor treatment for MRSA infections. Performed at [facility], [address] [place], [place] [address]      Labs: BNP (last 3 results) No results for input(s): BNP in the last 8760 hours. Basic Metabolic Panel: Recent Labs  Lab 08/04/20 1936 08/04/20 1936 08/04/20 1937 08/04/20 1945 08/04/20 2126 08/05/20 0344 08/06/20 0259 08/07/20 0500  NA 139   < > 141  --  141 139 139 140  K 2.7*   < > 2.7*  --  3.5 3.9 3.8 3.6  CL 102  --  105  --   --  108 104 102  CO2 19*  --   --   --   --  19* 24 25  GLUCOSE 187*  --  182*  --   --  103* 95 104*  BUN 12  --  16  --   --  9 6 <5*  CREATININE 1.18*  --  0.90  --   --  0.71 0.90 0.87  CALCIUM 9.9  --   --   --   --  8.9 9.2 9.2  MG  --   --   --  2.6*  --  1.9  --   --   PHOS  --   --   --   --   --  3.9  --   --    < > = values in this interval not displayed.   Liver Function Tests: Recent Labs  Lab 08/04/20 1936  AST 48*  ALT 52*  ALKPHOS 119  BILITOT 0.8  PROT 7.9  ALBUMIN 4.4   No results for input(s): LIPASE, AMYLASE in the last 168 hours. No results for input(s): AMMONIA in the last 168  hours. CBC: Recent Labs  Lab 08/04/20 1936 08/04/20 1936 08/04/20 1937 08/04/20 2126 08/05/20 0344 08/06/20 0259 08/07/20 0500  WBC 11.0*  --   --   --  13.7* 7.0 8.9  NEUTROABS 3.4  --   --   --   --   --   --   HGB 14.9   < > 16.0* 15.0 14.1 12.4 11.8*  HCT 46.1*   < > 47.0* 44.0 43.3 37.6 36.7  MCV 95.8  --   --   --  96.0 93.8 96.1  PLT 391  --   --   --  271 220 208   < > = values in this interval not displayed.   Cardiac Enzymes: No results for input(s): CKTOTAL, CKMB, CKMBINDEX, TROPONINI in the last 168 hours. BNP: Invalid input(s): POCBNP CBG: Recent Labs  Lab 08/06/20 1810 08/06/20 2029 08/07/20 0049 08/07/20 0434 08/07/20 0807  GLUCAP 89 76 70 99 126*   D-Dimer No results for input(s): DDIMER in the last 72 hours. Hgb A1c No results for input(s): HGBA1C in the last 72 hours. Lipid Profile No results for input(s): CHOL, HDL, LDLCALC, TRIG, CHOLHDL, LDLDIRECT in the last 72 hours. Thyroid function studies No results for input(s): TSH, T4TOTAL, T3FREE, THYROIDAB in the last 72 hours.  Invalid input(s): FREET3 Anemia work up No results for input(s): VITAMINB12, FOLATE, FERRITIN, TIBC, IRON, RETICCTPCT in the last 72 hours. Urinalysis    Component Value Date/Time   COLORURINE STRAW (A) 08/04/2020 2155   APPEARANCEUR CLEAR 08/04/2020 2155   LABSPEC 1.008 08/04/2020 2155   PHURINE 6.0 08/04/2020 2155   GLUCOSEU NEGATIVE 08/04/2020 2155   HGBUR NEGATIVE 08/04/2020 2155   BILIRUBINUR NEGATIVE 08/04/2020 2155   KETONESUR NEGATIVE 08/04/2020 2155   PROTEINUR NEGATIVE 08/04/2020 2155   UROBILINOGEN 0.2 06/22/2015 1208   NITRITE NEGATIVE 08/04/2020 2155   LEUKOCYTESUR NEGATIVE 08/04/2020 2155   Sepsis Labs Invalid input(s): PROCALCITONIN,  WBC,  LACTICIDVEN Microbiology Recent Results (from the past 240 hour(s))  Respiratory Panel by RT PCR (Flu A&B, Covid) - Nasopharyngeal Swab     Status: None   Collection Time: 08/04/20  7:37 PM   Specimen:  Nasopharyngeal Swab  Result  Value Ref Range Status   SARS Coronavirus 2 by RT PCR NEGATIVE NEGATIVE Final    Comment: (NOTE) SARS-CoV-2 target nucleic acids are NOT DETECTED.  The SARS-CoV-2 RNA is generally detectable in upper respiratoy specimens during the acute phase of infection. The lowest concentration of SARS-CoV-2 viral copies this assay can detect is 131 copies/mL. A negative result does not preclude SARS-Cov-2 infection and should not be used as the sole basis for treatment or other patient management decisions. A negative result may occur with  improper specimen collection/handling, submission of specimen other than nasopharyngeal swab, presence of viral mutation(s) within the areas targeted by this assay, and inadequate number of viral copies (<131 copies/mL). A negative result must be combined with clinical observations, patient history, and epidemiological information. The expected result is Negative.  Fact Sheet for Patients:  https://www.moore.com/  Fact Sheet for Healthcare Providers:  https://www.young.biz/  This test is no t yet approved or cleared by the [place] FDA and  has been authorized for detection and/or diagnosis of SARS-CoV-2 by FDA under an Emergency Use Authorization (EUA). This EUA will remain  in effect (meaning this test can be used) for the duration of the COVID-19 declaration under Section 564(b)(1) of the Act, 21 U.S.C. section 360bbb-3(b)(1), unless the authorization is terminated or revoked sooner.     Influenza A by PCR NEGATIVE NEGATIVE Final   Influenza B by PCR NEGATIVE NEGATIVE Final    Comment: (NOTE) The Xpert Xpress SARS-CoV-2/FLU/RSV assay is intended as an aid in  the diagnosis of influenza from Nasopharyngeal swab specimens and  should not be used as a sole basis for treatment. Nasal washings and  aspirates are unacceptable for Xpert Xpress SARS-CoV-2/FLU/RSV  testing.  Fact Sheet  for Patients: https://www.moore.com/  Fact Sheet for Healthcare Providers: https://www.young.biz/  This test is not yet approved or cleared by the [place] FDA and  has been authorized for detection and/or diagnosis of SARS-CoV-2 by  FDA under an Emergency Use Authorization (EUA). This EUA will remain  in effect (meaning this test can be used) for the duration of the  Covid-19 declaration under Section 564(b)(1) of the Act, 21  U.S.C. section 360bbb-3(b)(1), unless the authorization is  terminated or revoked. Performed at [facility], [address], [place], [place] [address]   MRSA PCR Screening     Status: None   Collection Time: 08/05/20 12:56 AM   Specimen: Nasopharyngeal  Result Value Ref Range Status   MRSA by PCR NEGATIVE NEGATIVE Final    Comment:        The GeneXpert MRSA Assay (FDA approved for NASAL specimens only), is one component of a comprehensive MRSA colonization surveillance program. It is not intended to diagnose MRSA infection nor to guide or monitor treatment for MRSA infections. Performed at [facility], [address] [place], [place] [address]      Time coordinating discharge: 40 minutes  SIGNED:   Alba Cory, MD  [facility]

## 2020-08-11 NOTE — B[facility] Group Notes (Signed)
Adult Psychoeducational Group Note  Date:  08/11/2020 Time:  9:30 PM  Group Topic/Focus:  Wrap-Up Group:   The focus of this group is to help patients review their daily goal of treatment and discuss progress on daily workbooks.  Participation Level:  Minimal  Participation Quality:  Inattentive  Affect:  Depressed and Tearful  Cognitive:  Alert and Appropriate  Insight: Appropriate  Engagement in Group:  Supportive  Modes of Intervention:  Discussion, Education and Support  Additional Comments:  Pt attended and participated in wrap up group this evening. Pt used group to apologize to peers about their attitude. Pt explained that they are experiencing loss and that they are not normally this way and for them to excuse any behavior that they may exhibit. Peers understood and Clinical research associate explained that we are understanding and that anytime they are having any negative feeling that they should come to staff.  Chrisandra Netters 08/11/2020, 9:30 PM

## 2020-08-11 NOTE — Progress Notes (Signed)
Admission Note:   Pt is admitted on a voluntary to [facility] to unit [address] Adult, report received from [place], [place] at . Pt is a 49 y/o female, status post overdose, was intubated on Nov. 10th, and extubated on Nov. 12th of 2021, per report, overdosed on cocaine and benzodiazapines. Pt minimizes this as a suicide attempt, reports that she, "just got drunk and took too many benzos." Pt reports she used drugs and alcohol to cope with the suicide of her boyfriend 2 weeks ago. Pt reports this triggered her feelings that she had when her sister also committed suicide back in 2003. Pt is focused on getting her evening dose of Klonopin and focused on discharge. Pt is anxious, depressed, tearful, labile mood, cooperative with admission process. Pt reports she lives at home with her mother and her nephew. 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 Also reports a long history of psychiatric and substance abuse treatment dating back to 2003 when she went to the [facility], and also the "[facility]" in [place]. Pt c/o sore throat from the intubation and a throat lozenge was obtained for pt per MD. Pt was cooperative with admission skin assessment, witnessed by MHT Rikki, which was unremarkable. Will continue to monitor pt per Q15 minute face checks and monitor for safety and progress.

## 2020-08-11 NOTE — Consult Note (Signed)
[facility] Face-to-Face Psychiatry Consult   Reason for Consult: Patient has questions about her home medications to include gabapentin 1200 mg twice a day and clonidine for PTSD.  Referring Physician:  Dr. Sunnie Nielsen Patient Identification: Margaret Mccann MRN:  [id number] Principal Diagnosis: Intentional drug overdose [facility]) Diagnosis:  Principal Problem:   Intentional drug overdose (HCC) Active Problems:   MDD (major depressive disorder), recurrent episode, severe (HCC)   Total Time spent with patient: 1 hour  Subjective:   Margaret Mccann is a 49 y.o. female patient admitted with suicide attempt by drug overdose.  Patient is alert and oriented, calm and cooperative, and very appropriate with this Clinical research associate throughout the evaluation.  She answers all questions appropriately.  She does inquire about resume her home medications, and which she states she has taken for the past 11 years with no changes.  She reports the only change took place 3 weeks ago when Dr. Evelene Croon prescribed Klonopin due to her boyfriend's suicide completion.  Patient appears to be remorseful and shows some regret regarding her recent actions.  She denies suicidal ideations, homicidal ideations, and or auditory visual hallucinations.  At this time she continues to be high risks for suicide completion, and will continue to recommend inpatient.   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 Patient is a poor historian but reports mourning the death of her boyfriend who died by suicide few weeks ago. She reports worsening depressive symptoms characterized by hopelessness, worthlessness, low energy level, lack of motivation and recurrent suicidal thoughts. Patient is tearful, denies psychosis, delusions but unable to contract for safety.  Past  Psychiatric History: Bereavement, PTSD, substance abuse, major depressive disorder, and anxiety  Risk to Self:  suicide attempt Risk to Others:  denies Prior Inpatient Therapy:  None recently Prior Outpatient Therapy:  Currently under the services of Dr. Lafayette Dragon at [facility].  Last office visit was November 5.  Past Medical History:  Past Medical History:  Diagnosis Date  . Anxiety   . Bipolar 1 disorder (HCC)   . Depression   . Morgellons syndrome     Past Surgical History:  Procedure Laterality Date  . APPENDECTOMY     Family History:  Family History  Problem Relation Age of Onset  . Heart attack Father 23  . Stroke Father   . Breast cancer Paternal Aunt   . Ulcerative colitis Neg Hx   . Esophageal cancer Neg Hx    Family Psychiatric  History: Family history of suicide, biological sister completed. Social History:  Social History   Substance and Sexual Activity  Alcohol Use Yes   Comment: occas     Social History   Substance and Sexual Activity  Drug Use Not Currently   Comment: not currently-was +cocaine and amphetamines, clean x3 years    Social History   Socioeconomic History  . Marital status: Single    Spouse name: Not on file  . Number of children: Not on file  . Years of education: Not on file  . Highest education level: Not on file  Occupational History  . Not on file  Tobacco Use  . Smoking status: Former Smoker    Packs/day: 0.50    Types: Cigarettes  . Smokeless tobacco: Never Used  Vaping Use  . Vaping Use: Never used  Substance and Sexual Activity  . Alcohol use: Yes  Comment: occas  . Drug use: Not Currently    Comment: not currently-was +cocaine and amphetamines, clean x3 years  . Sexual activity: Yes    Birth control/protection: Pill  Other Topics Concern  . Not on file  Social History Narrative   ** Merged History Encounter **       Social Determinants of Health   Financial Resource Strain:   .  Difficulty of Paying Living Expenses: Not on file  Food Insecurity:   . Worried About Programme researcher, broadcasting/film/video in the Last Year: Not on file  . Ran Out of Food in the Last Year: Not on file  Transportation Needs:   . Lack of Transportation (Medical): Not on file  . Lack of Transportation (Non-Medical): Not on file  Physical Activity:   . Days of Exercise per Week: Not on file  . Minutes of Exercise per Session: Not on file  Stress:   . Feeling of Stress : Not on file  Social Connections:   . Frequency of Communication with Friends and Family: Not on file  . Frequency of Social Gatherings with Friends and Family: Not on file  . Attends Religious Services: Not on file  . Active Member of Clubs or Organizations: Not on file  . Attends Banker Meetings: Not on file  . Marital Status: Not on file   Additional Social History:    Allergies:   Allergies  Allergen Reactions  . Lamictal [Lamotrigine] Anaphylaxis, Rash and Other (See Comments)    Stevens-Johnson syndrome and fevers, also  . Lamictal [Lamotrigine] Anaphylaxis, Rash and Other (See Comments)    Fevers and Stevens-Johnson syndrome, also  . Tramadol Other (See Comments)    Pt had a seizure after taking Tramadol!!  . Sulfamethoxazole-Trimethoprim Rash  . Erythromycin Nausea And Vomiting  . Erythromycin Base Nausea And Vomiting  . Levetiracetam Anxiety  . Sulfa Antibiotics Rash    Labs:  No results found for this or any previous visit (from the past 48 hour(s)).  Current Facility-Administered Medications  Medication Dose Route Frequency Provider Last Rate Last Admin  . acetaminophen (TYLENOL) tablet 650 mg  650 mg Oral Q6H PRN Zannie Cove, MD   650 mg at 08/11/20 0321  . benzonatate (TESSALON) capsule 100 mg  100 mg Oral TID PRN John Giovanni, MD   100 mg at 08/10/20 1843  . Chlorhexidine Gluconate Cloth 2 % PADS 6 each  6 each Topical Q0600 Briant Sites, DO   6 each at 08/10/20 1029  . clonazePAM  (KLONOPIN) tablet 0.5 mg  0.5 mg Oral BID PRN Lorin Glass, MD   0.5 mg at 08/11/20 0007  . docusate sodium (COLACE) capsule 100 mg  100 mg Oral BID Lorin Glass, MD   100 mg at 08/09/20 2232  . enoxaparin (LOVENOX) injection 40 mg  40 mg Subcutaneous Q24H Lorin Glass, MD   40 mg at 08/10/20 1313  . FLUoxetine (PROZAC) capsule 40 mg  40 mg Oral Daily Karl Ito, MD   40 mg at 08/11/20 0916  . gabapentin (NEURONTIN) capsule 600 mg  600 mg Oral BID Zannie Cove, MD   600 mg at 08/11/20 0913  . guaiFENesin (MUCINEX) 12 hr tablet 600 mg  600 mg Oral BID Zannie Cove, MD   600 mg at 08/11/20 0913  . LORazepam (ATIVAN) injection 1 mg  1 mg Intravenous Q4H PRN Lorin Glass, MD   1 mg at 08/11/20 0913  . MEDLINE mouth rinse  15 mL Mouth Rinse BID Lorin GlassSmith, Daniel C, MD   15 mL at 08/10/20 1029  . mirtazapine (REMERON) tablet 30 mg  30 mg Oral QHS Karl ItoSommer, Steven E, MD   30 mg at 08/10/20 2238  . nicotine (NICODERM CQ - dosed in mg/24 hours) patch 14 mg  14 mg Transdermal Daily Lorin GlassSmith, Daniel C, MD   14 mg at 08/11/20 0857  . pantoprazole (PROTONIX) EC tablet 40 mg  40 mg Oral QHS Lorin GlassSmith, Daniel C, MD   40 mg at 08/10/20 2142  . phenol (CHLORASEPTIC) mouth spray 1 spray  1 spray Mouth/Throat PRN Zannie CoveJoseph, Preetha, MD   1 spray at 08/09/20 1726  . polyethylene glycol (MIRALAX / GLYCOLAX) packet 17 g  17 g Oral Daily Lorin GlassSmith, Daniel C, MD   17 g at 08/09/20 0940  . sodium chloride flush (NS) 0.9 % injection 10-40 mL  10-40 mL Intracatheter Q12H Kirtland Bouchardorothy, Christopher R, MD   3 mL at 08/09/20 0943  . sodium chloride flush (NS) 0.9 % injection 10-40 mL  10-40 mL Intracatheter PRN Kirtland Bouchardorothy, Christopher R, MD   10 mL at 08/05/20 0140    Musculoskeletal: Strength & Muscle Tone: not tested Gait & Station: not tested Patient leans: N/A  Psychiatric Specialty Exam: Physical Exam Vitals and nursing note reviewed.  HENT:     Head: Normocephalic.  Neurological:     Mental Status: She is alert.   Psychiatric:        Attention and Perception: She is attentive.        Mood and Affect: Mood is anxious and depressed. Affect is not tearful.        Speech: Speech normal.        Behavior: Behavior is slowed. Behavior is not withdrawn.        Thought Content: Thought content includes suicidal ideation. Thought content includes suicidal plan.        Cognition and Memory: Cognition and memory normal.        Judgment: Judgment is impulsive.     Review of Systems  Constitutional: Positive for fatigue.  HENT: Negative.   Eyes: Negative.   Psychiatric/Behavioral: Positive for dysphoric mood and suicidal ideas. The patient is nervous/anxious.     Blood pressure (!) 134/95, pulse 93, temperature 98.2 F (36.8 C), temperature source Oral, resp. rate 19, height 5\' 3"  (1.6 m), weight 70.5 kg, SpO2 98 %.Body mass index is 27.53 kg/m.  General Appearance: Casual  Eye Contact:  Fair  Speech:  Clear and Coherent and Normal Rate  Volume:  Normal  Mood:  Depressed  Affect:  Appropriate and Congruent  Thought Process:  Coherent, Linear and Descriptions of Associations: Intact  Orientation:  Full (Time, Place, and Person)  Thought Content:  Logical  Suicidal Thoughts:  No  Homicidal Thoughts:  No  Memory:  Immediate;   Fair Recent;   Fair Remote;   Fair  Judgement:  Fair  Insight:  Fair  Psychomotor Activity:  Psychomotor Retardation  Concentration:  Concentration: Fair and Attention Span: Fair  Recall:  [employer]iservFair  Fund of Knowledge:  Fair  Language:  Good  Akathisia:  No  Handed:  Right  AIMS (if indicated):     Assets:  Communication Skills Desire for Improvement Social Support  ADL's:  Intact  Cognition:  WNL  Sleep:        Treatment Plan Summary: 49 year old female with history of multiple mental disorders who was admitted after she intentionally attempted suicide by overdosing  on drugs/medications. She will benefit from inpatient psychiatric admission after she is medically  cleared.  Recommendations: -Continue 1:1 sitter for safety -Continues Prozac 40 mg daily for depression -Increase gabapentin to 1200 mg p.o. twice daily for mood and anxiety, did verify that this is her home dose.  -We will also resume clonidine 0.2 mg p.o. nightly for PTSD, also verified home dose. -Continue working with Child psychotherapist consult to facilitate psychiatric inpatient admission -Consider IVC if patient refused Voluntary psychiatric admission.  Disposition: Recommend psychiatric Inpatient admission when medically cleared. Supportive therapy provided about ongoing stressors. Psychiatric service signing out. Re-consult as needed  Maryagnes Amos, FNP 08/11/2020 12:07 PM

## 2020-08-11 NOTE — Plan of Care (Signed)
  Problem: Education: Goal: Knowledge of General Education information will improve Description Including pain rating scale, medication(s)/side effects and non-pharmacologic comfort measures Outcome: Progressing   

## 2020-08-11 NOTE — Progress Notes (Signed)
Pt stated she had a lot of anxiety, writer discussed non-pharmacological solutions to dealing with anxiety     08/11/20 2300  Psych Admission Type (Psych Patients Only)  Admission Status Voluntary  Psychosocial Assessment  Patient Complaints Anxiety  Eye Contact Brief  Facial Expression Anxious  Affect Depressed  Speech Soft  Interaction Needy  Motor Activity Restless  Appearance/Hygiene Disheveled  Behavior Characteristics Cooperative  Mood Depressed  Thought Process  Coherency WDL  Content WDL  Delusions None reported or observed  Perception WDL  Hallucination None reported or observed  Judgment WDL  Confusion WDL  Danger to Self  Current suicidal ideation? Denies  Self-Injurious Behavior No self-injurious ideation or behavior indicators observed or expressed   Agreement Not to Harm Self No  Danger to Others  Danger to Others None reported or observed

## 2020-08-11 NOTE — Progress Notes (Signed)
Pt accepted to [facility], bed 401-2    Dr. Jannifer Franklin is the accepting provider.    Dr. Jola Babinski is the attending provider.    Call report to [phone]    [facility] @ [facility] 5W notified.     Pt is scheduled to arrive at [facility] after 2pm.   Wells Guiles, MSW, LCSW, LCAS Clinical Social Worker II Disposition CSW [phone]

## 2020-08-12 DIAGNOSIS — T1491XA Suicide attempt, initial encounter: Secondary | ICD-10-CM

## 2020-08-12 MED ORDER — CLONAZEPAM 0.5 MG PO TABS
0.5000 mg | ORAL_TABLET | Freq: Two times a day (BID) | ORAL | Status: DC | PRN
  Administered 2020-08-12 – 2020-08-13 (×2): 0.5 mg via ORAL
  Filled 2020-08-12 (×2): qty 1

## 2020-08-12 MED ORDER — INFLUENZA VAC SPLIT QUAD 0.5 ML IM SUSY
0.5000 mL | PREFILLED_SYRINGE | INTRAMUSCULAR | Status: AC
  Administered 2020-08-13: 0.5 mL via INTRAMUSCULAR
  Filled 2020-08-12: qty 0.5

## 2020-08-12 MED ORDER — FLUOXETINE HCL 20 MG PO CAPS
60.0000 mg | ORAL_CAPSULE | Freq: Every day | ORAL | Status: DC
  Administered 2020-08-13 – 2020-08-14 (×2): 60 mg via ORAL
  Filled 2020-08-12 (×3): qty 3
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
## 2020-08-12 NOTE — BHH Group Notes (Signed)
The focus of this group is to help patients establish daily goals to achieve during treatment and discuss how the patient can incorporate goal setting into their daily lives to aide in recovery.   Patient attended group and contributed to group. 
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
## 2020-08-12 NOTE — Progress Notes (Signed)
Pt denies SI/HI/AVH.  Pt described her mood as "much better than yesterday."  Pt taking medications without incident.  RN provided support and assessed for needs and concerns.  Pt remains safe with q 15 min checks in place.
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
## 2020-08-13 MED ORDER — GABAPENTIN 600 MG PO TABS
1200.0000 mg | ORAL_TABLET | Freq: Every day | ORAL | Status: DC
  Administered 2020-08-13: 1200 mg via ORAL
  Filled 2020-08-13 (×2): qty 2

## 2020-08-13 MED ORDER — CLONAZEPAM 0.5 MG PO TABS
0.5000 mg | ORAL_TABLET | Freq: Every day | ORAL | Status: DC | PRN
  Administered 2020-08-14: 0.5 mg via ORAL
  Filled 2020-08-13: qty 1

## 2020-08-13 MED ORDER — GABAPENTIN 400 MG PO CAPS
1200.0000 mg | ORAL_CAPSULE | Freq: Every day | ORAL | Status: DC
  Filled 2020-08-13: qty 3

## 2020-08-13 MED ORDER — GABAPENTIN 600 MG PO TABS
1200.0000 mg | ORAL_TABLET | Freq: Every day | ORAL | Status: DC
  Administered 2020-08-14: 1200 mg via ORAL
  Filled 2020-08-13 (×2): qty 2

## 2020-08-13 NOTE — Tx Team (Signed)
Interdisciplinary Treatment and Diagnostic Plan Update  08/13/2020 Time of Session: 9:25am  Margaret Mccann MRN: [id number]  Principal Diagnosis: Suicide attempt [facility])  Secondary Diagnoses: Principal Problem:   Suicide attempt [facility]) Active Problems:   Grief   PTSD (post-traumatic stress disorder)   Polysubstance abuse (HCC)   MDD (major depressive disorder), recurrent episode (HCC)   Current Medications:  Current Facility-Administered Medications  Medication Dose Route Frequency Provider Last Rate Last Admin  . acetaminophen (TYLENOL) tablet 650 mg  650 mg Oral Q6H PRN Maryagnes Amos, FNP      . acetaminophen (TYLENOL) tablet 650 mg  650 mg Oral Q6H PRN Maryagnes Amos, FNP   650 mg at 08/13/20 0943  . alum & mag hydroxide-simeth (MAALOX/MYLANTA) 200-200-20 MG/5ML suspension 30 mL  30 mL Oral Q4H PRN Rosario Adie, Juel Burrow, FNP      . clonazePAM (KLONOPIN) tablet 0.5 mg  0.5 mg Oral BID PRN Lauro Franklin, MD   0.5 mg at 08/13/20 1048  . cloNIDine (CATAPRES) tablet 0.2 mg  0.2 mg Oral QHS Maryagnes Amos, FNP   0.2 mg at 08/12/20 2127  . docusate sodium (COLACE) capsule 100 mg  100 mg Oral BID Maryagnes Amos, FNP   100 mg at 08/12/20 1638  . FLUoxetine (PROZAC) capsule 60 mg  60 mg Oral Daily Lauro Franklin, MD   60 mg at 08/13/20 0826  . gabapentin (NEURONTIN) capsule 1,200 mg  1,200 mg Oral BID Maryagnes Amos, FNP   1,200 mg at 08/13/20 0827  . hydrOXYzine (ATARAX/VISTARIL) tablet 25 mg  25 mg Oral TID PRN Maryagnes Amos, FNP   25 mg at 08/12/20 1348  . influenza vac split quadrivalent PF (FLUARIX) injection 0.5 mL  0.5 mL Intramuscular Tomorrow-1000 Money, Travis B, FNP      . magnesium hydroxide (MILK OF MAGNESIA) suspension 30 mL  30 mL Oral Daily PRN Rosario Adie, Juel Burrow, FNP      . menthol-cetylpyridinium (CEPACOL) lozenge 3 mg  1 lozenge Oral PRN Cristofano, Worthy Rancher, MD   3 mg at 08/13/20 0419  .  mirtazapine (REMERON) tablet 30 mg  30 mg Oral QHS Maryagnes Amos, FNP   30 mg at 08/12/20 2127  . nicotine (NICODERM CQ - dosed in mg/24 hours) patch 14 mg  14 mg Transdermal Daily Maryagnes Amos, FNP   14 mg at 08/13/20 0829  . pantoprazole (PROTONIX) EC tablet 40 mg  40 mg Oral QHS Maryagnes Amos, FNP   40 mg at 08/12/20 2127   PTA Medications: Medications Prior to Admission  Medication Sig Dispense Refill Last Dose  . acetaminophen (TYLENOL) 325 MG tablet Take 2 tablets (650 mg total) by mouth every 6 (six) hours as needed for fever.     . clonazePAM (KLONOPIN) 0.5 MG tablet Take 1 tablet (0.5 mg total) by mouth 2 (two) times daily as needed for anxiety. 60 tablet 1   . cloNIDine (CATAPRES) 0.2 MG tablet Take 1 tablet (0.2 mg total) by mouth at bedtime. 30 tablet 0   . FLUoxetine (PROZAC) 40 MG capsule Take 1 capsule (40 mg total) by mouth daily. 30 capsule 1   . gabapentin (NEURONTIN) 400 MG capsule Take 3 capsules (1,200 mg total) by mouth 2 (two) times daily. 30 capsule 0   . mirtazapine (REMERON) 30 MG tablet Take 1 tablet (30 mg total) by mouth at bedtime. 30 tablet 1     Patient Stressors: Loss of Significant other to  suicide Substance abuse Traumatic event  Patient Strengths: Ability for insight Capable of independent living Communication skills Supportive family/friends  Treatment Modalities: Medication Management, Group therapy, Case management,  1 to 1 session with clinician, Psychoeducation, Recreational therapy.   Physician Treatment Plan for Primary Diagnosis: Suicide attempt [facility]) Long Term Goal(s): Improvement in symptoms so as ready for discharge Improvement in symptoms so as ready for discharge   Short Term Goals: Ability to identify changes in lifestyle to reduce recurrence of condition will improve Ability to verbalize feelings will improve Ability to disclose and discuss suicidal ideas Ability to demonstrate self-control will  improve Ability to identify and develop effective coping behaviors will improve Compliance with prescribed medications will improve Ability to identify triggers associated with substance abuse/mental health issues will improve Ability to identify changes in lifestyle to reduce recurrence of condition will improve Ability to verbalize feelings will improve Ability to disclose and discuss suicidal ideas Ability to demonstrate self-control will improve Ability to identify and develop effective coping behaviors will improve Compliance with prescribed medications will improve Ability to identify triggers associated with substance abuse/mental health issues will improve  Medication Management: Evaluate patient's response, side effects, and tolerance of medication regimen.  Therapeutic Interventions: 1 to 1 sessions, Unit Group sessions and Medication administration.  Evaluation of Outcomes: Progressing  Physician Treatment Plan for Secondary Diagnosis: Principal Problem:   Suicide attempt [facility]) Active Problems:   Grief   PTSD (post-traumatic stress disorder)   Polysubstance abuse (HCC)   MDD (major depressive disorder), recurrent episode (HCC)  Long Term Goal(s): Improvement in symptoms so as ready for discharge Improvement in symptoms so as ready for discharge   Short Term Goals: Ability to identify changes in lifestyle to reduce recurrence of condition will improve Ability to verbalize feelings will improve Ability to disclose and discuss suicidal ideas Ability to demonstrate self-control will improve Ability to identify and develop effective coping behaviors will improve Compliance with prescribed medications will improve Ability to identify triggers associated with substance abuse/mental health issues will improve Ability to identify changes in lifestyle to reduce recurrence of condition will improve Ability to verbalize feelings will improve Ability to disclose and discuss  suicidal ideas Ability to demonstrate self-control will improve Ability to identify and develop effective coping behaviors will improve Compliance with prescribed medications will improve Ability to identify triggers associated with substance abuse/mental health issues will improve     Medication Management: Evaluate patient's response, side effects, and tolerance of medication regimen.  Therapeutic Interventions: 1 to 1 sessions, Unit Group sessions and Medication administration.  Evaluation of Outcomes: Progressing   RN Treatment Plan for Primary Diagnosis: Suicide attempt [facility]) Long Term Goal(s): Knowledge of disease and therapeutic regimen to maintain health will improve  Short Term Goals: Ability to remain free from injury will improve, Ability to demonstrate self-control, Ability to participate in decision making will improve, Ability to verbalize feelings will improve, Ability to disclose and discuss suicidal ideas and Ability to identify and develop effective coping behaviors will improve  Medication Management: RN will administer medications as ordered by provider, will assess and evaluate patient's response and provide education to patient for prescribed medication. RN will report any adverse and/or side effects to prescribing provider.  Therapeutic Interventions: 1 on 1 counseling sessions, Psychoeducation, Medication administration, Evaluate responses to treatment, Monitor vital signs and CBGs as ordered, Perform/monitor CIWA, COWS, AIMS and Fall Risk screenings as ordered, Perform wound care treatments as ordered.  Evaluation of Outcomes: Progressing   LCSW Treatment  Plan for Primary Diagnosis: Suicide attempt [facility]) Long Term Goal(s): Safe transition to appropriate next level of care at discharge, Engage patient in therapeutic group addressing interpersonal concerns.  Short Term Goals: Engage patient in aftercare planning with referrals and resources, Increase social support,  Increase emotional regulation, Facilitate acceptance of mental health diagnosis and concerns, Facilitate patient progression through stages of change regarding substance use diagnoses and concerns and Identify triggers associated with mental health/substance abuse issues  Therapeutic Interventions: Assess for all discharge needs, 1 to 1 time with Social worker, Explore available resources and support systems, Assess for adequacy in community support network, Educate family and significant other(s) on suicide prevention, Complete Psychosocial Assessment, Interpersonal group therapy.  Evaluation of Outcomes: Progressing   Progress in Treatment: Attending groups: Yes. Participating in groups: Yes. Taking medication as prescribed: Yes. Toleration medication: Yes. Family/Significant other contact made: Yes, individual(s) contacted:  Mother and step-father \ Patient understands diagnosis: No. Discussing patient identified problems/goals with staff: Yes. Medical problems stabilized or resolved: Yes. Denies suicidal/homicidal ideation: Yes. Issues/concerns per patient self-inventory: No.   New problem(s) identified: No, Describe:  None   New Short Term/Long Term Goal(s): medication stabilization, elimination of SI thoughts, development of comprehensive mental wellness plan.   Patient Goals:  "To stay focused on positivity and resolve my guilt"   Discharge Plan or Barriers:  Patient recently admitted. CSW will continue to follow and assess for appropriate referrals and possible discharge planning.   Reason for Continuation of Hospitalization: Depression Medication stabilization Suicidal ideation  Estimated Length of Stay: 3 to 5 days   Attendees: Patient: Margaret Mccann 08/13/2020   Physician: Pricilla Larsson, MD 08/13/2020   Nursing:  08/13/2020   RN Care Manager: 08/13/2020   Social Worker: Melba Coon, LCSWA 08/13/2020   Recreational Therapist:  08/13/2020   Other:  08/13/2020    Other:  08/13/2020   Other: 08/13/2020      Scribe for Treatment Team: Aram Beecham, LCSWA 08/13/2020 10:51 AM

## 2020-08-13 NOTE — Progress Notes (Signed)
The focus of this group is to help patients establish daily goals to achieve during treatment and discuss how the patient can incorporate goal setting into their daily lives to aide in recovery.The patient also expressed that her goal is to be responsible for herself.

## 2020-08-13 NOTE — Progress Notes (Signed)
[facility] MD Progress Note  08/13/2020 7:17 AM Margaret Mccann  MRN:  [id number] Subjective:   Mrs. Margaret Mccann is a 49 yr old female who presents after an accidental overdose. Past PPHx is significant for MDD, recurrent, severe, Polysubstance use disorder (EtOH, Cocaine, opioids, Methamphetamines) with multiple long term rehab admissions , GAD, and 4 previous suicide attempts.  She continues to minimize her current circumstances. She reports doing well and that she is eager to leave. She reports no SI, HI, or AVH. She reports sleeping well and eating well. She continues to endorse throat pain from intubation but reports it is controlled by lozenges and Tylenol. Reports that she is gaining benefit from group therapy sessions.  When previous substance use is brought up she states she has thought of this and plans to attend AA meeting and that she knows all the meeting location around her parents house. She reports that this will help keep her away from alcohol and drug use.  Principal Problem: Suicide attempt [facility]) Diagnosis: Principal Problem:   Suicide attempt [facility] Active Problems:   Grief   PTSD (post-traumatic stress disorder)   Polysubstance abuse (HCC)   MDD (major depressive disorder), recurrent episode (HCC)  Total Time spent with patient: 15 minutes  Past Psychiatric History: Patient not a reliable historian, under reports past. History is mostly obtained from chart review. MDD, recurrent, severe, Polysubstance use disorder (EtOH, Cocaine, opioids, Methamphetamines) with multiple long term rehab admissions , GAD, and 4 previous suicide attempts.   Past Medical History:  Past Medical History:  Diagnosis Date  . Anxiety   . Bipolar 1 disorder (HCC)   . Depression   . Morgellons syndrome     Past Surgical History:  Procedure Laterality Date  . APPENDECTOMY     Family History:  Family History  Problem Relation Age of Onset  . Heart attack Father 48  . Stroke Father    . Breast cancer Paternal Aunt   . Ulcerative colitis Neg Hx   . Esophageal cancer Neg Hx    Family Psychiatric  History: Sister: Suicide in 2003 Paternal Grandfather: Bipolar Multiple Paternal Cousins: Bipolar and addictions  Social History:  Social History   Substance and Sexual Activity  Alcohol Use Yes   Comment: occas     Social History   Substance and Sexual Activity  Drug Use Not Currently   Comment: not currently-was +cocaine and amphetamines, clean x3 years    Social History   Socioeconomic History  . Marital status: Single    Spouse name: Not on file  . Number of children: Not on file  . Years of education: Not on file  . Highest education level: Not on file  Occupational History  . Not on file  Tobacco Use  . Smoking status: Former Smoker    Packs/day: 0.50    Types: Cigarettes  . Smokeless tobacco: Never Used  Vaping Use  . Vaping Use: Never used  Substance and Sexual Activity  . Alcohol use: Yes    Comment: occas  . Drug use: Not Currently    Comment: not currently-was +cocaine and amphetamines, clean x3 years  . Sexual activity: Yes    Birth control/protection: Pill  Other Topics Concern  . Not on file  Social History Narrative   ** Merged History Encounter **       Social Determinants of Health   Financial Resource Strain:   . Difficulty of Paying Living Expenses: Not on file  Food Insecurity:   .  Worried About Programme researcher, broadcasting/film/video in the Last Year: Not on file  . Ran Out of Food in the Last Year: Not on file  Transportation Needs:   . Lack of Transportation (Medical): Not on file  . Lack of Transportation (Non-Medical): Not on file  Physical Activity:   . Days of Exercise per Week: Not on file  . Minutes of Exercise per Session: Not on file  Stress:   . Feeling of Stress : Not on file  Social Connections:   . Frequency of Communication with Friends and Family: Not on file  . Frequency of Social Gatherings with Friends and Family: Not  on file  . Attends Religious Services: Not on file  . Active Member of Clubs or Organizations: Not on file  . Attends Banker Meetings: Not on file  . Marital Status: Not on file   Additional Social History:                         Sleep: Good  Appetite:  Good  Current Medications: Current Facility-Administered Medications  Medication Dose Route Frequency Provider Last Rate Last Admin  . acetaminophen (TYLENOL) tablet 650 mg  650 mg Oral Q6H PRN Maryagnes Amos, FNP      . acetaminophen (TYLENOL) tablet 650 mg  650 mg Oral Q6H PRN Maryagnes Amos, FNP   650 mg at 08/13/20 0418  . alum & mag hydroxide-simeth (MAALOX/MYLANTA) 200-200-20 MG/5ML suspension 30 mL  30 mL Oral Q4H PRN Rosario Adie, Juel Burrow, FNP      . clonazePAM (KLONOPIN) tablet 0.5 mg  0.5 mg Oral BID PRN Lauro Franklin, MD   0.5 mg at 08/12/20 2127  . cloNIDine (CATAPRES) tablet 0.2 mg  0.2 mg Oral QHS Maryagnes Amos, FNP   0.2 mg at 08/12/20 2127  . docusate sodium (COLACE) capsule 100 mg  100 mg Oral BID Maryagnes Amos, FNP   100 mg at 08/12/20 1638  . FLUoxetine (PROZAC) capsule 60 mg  60 mg Oral Daily Pluma Diniz, Mardelle Matte, MD      . gabapentin (NEURONTIN) capsule 1,200 mg  1,200 mg Oral BID Maryagnes Amos, FNP   1,200 mg at 08/12/20 2126  . hydrOXYzine (ATARAX/VISTARIL) tablet 25 mg  25 mg Oral TID PRN Maryagnes Amos, FNP   25 mg at 08/12/20 1348  . influenza vac split quadrivalent PF (FLUARIX) injection 0.5 mL  0.5 mL Intramuscular Tomorrow-1000 Money, Travis B, FNP      . magnesium hydroxide (MILK OF MAGNESIA) suspension 30 mL  30 mL Oral Daily PRN Rosario Adie, Juel Burrow, FNP      . menthol-cetylpyridinium (CEPACOL) lozenge 3 mg  1 lozenge Oral PRN Cristofano, Worthy Rancher, MD   3 mg at 08/13/20 0419  . mirtazapine (REMERON) tablet 30 mg  30 mg Oral QHS Maryagnes Amos, FNP   30 mg at 08/12/20 2127  . nicotine (NICODERM CQ - dosed in mg/24  hours) patch 14 mg  14 mg Transdermal Daily Maryagnes Amos, FNP   14 mg at 08/12/20 0843  . pantoprazole (PROTONIX) EC tablet 40 mg  40 mg Oral QHS Maryagnes Amos, FNP   40 mg at 08/12/20 2127    Lab Results: No results found for this or any previous visit (from the past 48 hour(s)).  Blood Alcohol level:  Lab Results  Component Value Date   ETH <10 08/04/2020   ETH <10 01/29/2020  Metabolic Disorder Labs: No results found for: HGBA1C, MPG No results found for: PROLACTIN Lab Results  Component Value Date   TRIG 189 (H) 08/07/2020    Physical Findings: AIMS:  , ,  ,  ,    CIWA:    COWS:     Musculoskeletal: Strength & Muscle Tone: within normal limits Gait & Station: normal Patient leans: N/A  Psychiatric Specialty Exam: Physical Exam Vitals and nursing note reviewed.  Constitutional:      General: She is not in acute distress.    Appearance: Normal appearance. She is normal weight. She is not ill-appearing, toxic-appearing or diaphoretic.  HENT:     Head: Normocephalic and atraumatic.  Cardiovascular:     Rate and Rhythm: Normal rate.  Pulmonary:     Effort: Pulmonary effort is normal.  Musculoskeletal:        General: Normal range of motion.  Neurological:     General: No focal deficit present.     Mental Status: She is alert.     Review of Systems  Constitutional: Negative for fatigue and fever.  HENT: Positive for sore throat (due to intubation).   Respiratory: Negative for chest tightness and shortness of breath.   Cardiovascular: Negative for chest pain and palpitations.  Gastrointestinal: Negative for abdominal pain, constipation, diarrhea, nausea and vomiting.  Neurological: Negative for dizziness, light-headedness and headaches.  Psychiatric/Behavioral: Negative for agitation, behavioral problems, hallucinations, self-injury, sleep disturbance and suicidal ideas. The patient is not nervous/anxious and is not hyperactive.     Blood  pressure 111/77, pulse 68, temperature 97.6 F (36.4 C), temperature source Oral, resp. rate 16, height 5\' 3"  (1.6 m), weight 70.3 kg, SpO2 98 %.Body mass index is 27.46 kg/m.  General Appearance: Casual  Eye Contact:  Good  Speech:  Clear and Coherent and Normal Rate  Volume:  Normal  Mood:  Euphoric  Affect:  Appropriate  Thought Process:  Coherent and Goal Directed  Orientation:  Full (Time, Place, and Person)  Thought Content:  Logical  Suicidal Thoughts:  No  Homicidal Thoughts:  No  Memory:  Immediate;   Fair Recent;   Fair  Judgement:  Fair  Insight:  Fair  Psychomotor Activity:  Normal  Concentration:  Concentration: Good and Attention Span: Good  Recall:  Good  Fund of Knowledge:  Good  Language:  Good  Akathisia:  No  Handed:  Right  AIMS (if indicated):     Assets:  Communication Skills Desire for Improvement Resilience  ADL's:  Intact  Cognition:  WNL  Sleep:  Number of Hours: 4.5     Treatment Plan Summary: Daily contact with patient to assess and evaluate symptoms and progress in treatment  She continues to minimize the severity of her situation. Will stop Klonopin to monitor for withdrawal with target discharge of Saturday. Continue to encourage attendance of group therapy. Offer substance abuse resources.  -Continue Prozac 60 mg daily -Stop Klonopin -Continue to offer resources for substance abuse   Thursday, MD 08/13/2020, 7:17 AM

## 2020-08-13 NOTE — Progress Notes (Signed)
Pt stated she felt better and has more hope    08/13/20 0000  Psych Admission Type (Psych Patients Only)  Admission Status Voluntary  Psychosocial Assessment  Patient Complaints Anxiety  Eye Contact Brief  Facial Expression Anxious  Affect Depressed  Speech Soft  Interaction Assertive  Motor Activity Restless  Appearance/Hygiene Unremarkable  Behavior Characteristics Cooperative  Mood Depressed  Thought Process  Coherency WDL  Content WDL  Delusions None reported or observed  Perception WDL  Hallucination None reported or observed  Judgment WDL  Confusion WDL  Danger to Self  Current suicidal ideation? Denies  Self-Injurious Behavior No self-injurious ideation or behavior indicators observed or expressed   Agreement Not to Harm Self No  Danger to Others  Danger to Others None reported or observed

## 2020-08-13 NOTE — Progress Notes (Signed)
Pt states that her throat is very sore and wants to know what she can do for it. Pt earlier told that she will have to stop talking to give her tissues some rest. "I know I need to stop talking. It's gotten worse throughout the day because I have been talking." Pt in room talking with her roommate since the dayroom closed. Pt given warm salt water and instructed to gargle with it several times and then to take a throat lozenge. Will continue to monitor.

## 2020-08-13 NOTE — BHH Counselor (Signed)
Margaret Mccann reports to CSW that she spoke with her mother and step-father and they state that she can come home as long as she does therapy, medication management, and AA.  Margaret Mccann states that she does not feel that she needs SAIOP or a treatment program.  Margaret Mccann does state that she will accept a list of providers and resources for substance use. CSW will contact the mother and step-father to confirm that Margaret Mccann can come back to their home.

## 2020-08-13 NOTE — BHH Suicide Risk Assessment (Signed)
BHH INPATIENT:  Family/Significant Other Suicide Prevention Education  Suicide Prevention Education:  Education Completed; Pricilla Riffle [phone] (Mother) has been identified by the patient as the family member/significant other with whom the patient will be residing, and identified as the person(s) who will aid the patient in the event of a mental health crisis (suicidal ideations/suicide attempt).  With written consent from the patient, the family member/significant other has been provided the following suicide prevention education, prior to the and/or following the discharge of the patient.  The suicide prevention education provided includes the following:  Suicide risk factors  Suicide prevention and interventions  National Suicide Hotline telephone number  [facility] assessment telephone number  [facility] Emergency Assistance 911  [facility] and/or Residential Mobile Crisis Unit telephone number  Request made of family/significant other to:  Remove weapons (e.g., guns, rifles, knives), all items previously/currently identified as safety concern.    Remove drugs/medications (over-the-counter, prescriptions, illicit drugs), all items previously/currently identified as a safety concern.  The family member/significant other verbalizes understanding of the suicide prevention education information provided.  The family member/significant other agrees to remove the items of safety concern listed above.  Mrs. Laural Benes states that she and her husband are ready for Talesha to come home.  Mrs. Laural Benes states that she wants Litha to go to outpatient appointments and AA.  Mrs. Laural Benes states that they feel that Hayat is really working to make things better and has made a commitment to her sobriety.  Mrs. Laural Benes would like Shadiyah to have resources for substance use when she leaves the hospital and Mrs. Laural Benes would like to have resources for her own therapy as well and  believes this will help her manage issues with Jaclynn as well.  Mrs. Laural Benes states that she will be working on boundaries with Phil due to her leaving and returning to the home multiple times.  Mrs. Laural Benes states there are no firearms or weapons in the home.  CSW completed SPE with Mrs. Laural Benes.   Metro Kung Lawarence Meek 08/13/2020, 11:42 AM

## 2020-08-13 NOTE — BHH Group Notes (Signed)
LCSW Group Therapy Note  08/13/2020   1430  Type of Therapy and Topic:  Group Therapy: What's So Funny About Mental Illness?  Participation Level:  Active   Description of Group:   In this group, patients learned how to recognize how they personally define their mental illness. This group also addressed stigma associated with mental illness.  Patients were asked to give examples of experiences surrounding living with a mental illness and how it makes them feel. Patients were asked to self-reflect on how they have chosen to address their mental health thus far and were invited to share those lessons or to discuss how stigma has affected their mental health care. Patients actively explore how their mental illness has impacted decisions and actions as well as how future decisions can be impacted.  Therapeutic Goals: 1. Patients will identify what can be considered mental illness 2. Patients will identify lessons learned from past experiences and how they can be applied to future struggles. 3. Patients will establish rapport with peers in a therapeutic setting.  Summary of Patient Progress:  The patient shared that she thought the topic of fight or flight was interesting and believes she is in this state most days.  Margaret Mccann feels that she has PTSD and that this disorder is changing her body and making her more startled at lights and sounds.  Margaret Mccann states that she has participated in EMDR and is wanting to try Camamine treatments.    Therapeutic Modalities:   Cognitive Behavioral Therapy    Aram Beecham, LCSW 08/13/2020  2:53 PM

## 2020-08-13 NOTE — Progress Notes (Signed)
Recreation Therapy Notes  Date:  11.19.21 Time: 0930 Location: [address] Dayroom  Group Topic: Stress Management  Goal Area(s) Addresses:  Patient will identify positive stress management techniques. Patient will identify benefits of using stress management post d/c.  Intervention: Stress Management  Activity: Guided Imagery.  LRT read a script that took patients for a walk along the white sands of a relaxing beach.  Patients were to listen and follow along as script was read to engage in activity.    Education: Stress Management, Discharge Planning.   Education Outcome: Acknowledges Education  Clinical Observations/Feedback: Pt did not attend activity.      Caroll Rancher, LRT/CTRS        Caroll Rancher A 08/13/2020 11:19 AM

## 2020-08-13 NOTE — Progress Notes (Addendum)
   08/13/20 2127  Psych Admission Type (Psych Patients Only)  Admission Status Voluntary  Psychosocial Assessment  Patient Complaints Depression  Eye Contact Fair  Facial Expression Anxious  Affect Anxious;Depressed  Speech Logical/coherent  Interaction Assertive  Motor Activity Restless  Appearance/Hygiene Unremarkable  Behavior Characteristics Cooperative;Appropriate to situation;Anxious  Mood Depressed;Anxious  Thought Process  Coherency WDL  Content WDL  Delusions None reported or observed  Perception WDL  Hallucination None reported or observed  Judgment WDL  Confusion None  Danger to Self  Current suicidal ideation? Denies  Danger to Others  Danger to Others None reported or observed   Depression 2/10; pain 8/10 in her throat from being intubated. Temperature of 99.3 - given Tylenol 650 mg. "Is it strep throat?" Pt told to speak with doctor in the morning. Pt advised to stop talking to give her throat rest.

## 2020-08-13 NOTE — Plan of Care (Signed)
Patient is out of bed and active in the milieu. Alert and oriented. Denies SI/HI/AVH. Reports that she slept fine and appetite improving. Continues to complain of sore throat and receiving Tylenol. Patient is pleasant on approach with no additional concerns so far. Was encouraged to express her thoughts and feelings as needed. Safety monitored as expected.

## 2020-08-14 ENCOUNTER — Other Ambulatory Visit: Payer: Self-pay

## 2020-08-14 ENCOUNTER — Encounter (HOSPITAL_COMMUNITY): Payer: Self-pay | Admitting: Emergency Medicine

## 2020-08-14 ENCOUNTER — Emergency Department (HOSPITAL_COMMUNITY): Payer: Self-pay

## 2020-08-14 ENCOUNTER — Emergency Department (HOSPITAL_COMMUNITY)
Admission: EM | Admit: 2020-08-14 | Discharge: 2020-08-14 | Disposition: A | Discharge status: Home / Self Care | Payer: Self-pay | Attending: Emergency Medicine | Admitting: Emergency Medicine

## 2020-08-14 DIAGNOSIS — T50902A Poisoning by unspecified drugs, medicaments and biological substances, intentional self-harm, initial encounter: Secondary | ICD-10-CM | POA: Insufficient documentation

## 2020-08-14 DIAGNOSIS — R490 Dysphonia: Secondary | ICD-10-CM

## 2020-08-14 DIAGNOSIS — F332 Major depressive disorder, recurrent severe without psychotic features: Principal | ICD-10-CM

## 2020-08-14 DIAGNOSIS — Z79899 Other long term (current) drug therapy: Secondary | ICD-10-CM | POA: Insufficient documentation

## 2020-08-14 DIAGNOSIS — Z87891 Personal history of nicotine dependence: Secondary | ICD-10-CM | POA: Insufficient documentation

## 2020-08-14 DIAGNOSIS — J029 Acute pharyngitis, unspecified: Secondary | ICD-10-CM | POA: Insufficient documentation

## 2020-08-14 MED ORDER — MENTHOL 3 MG MT LOZG: 1.0000 | LOZENGE | OROMUCOSAL | 0 refills | Status: DC | PRN

## 2020-08-14 MED ORDER — LIDOCAINE VISCOUS HCL 2 % MT SOLN: 15.0000 mL | OROMUCOSAL | 0 refills | Status: DC | PRN

## 2020-08-14 MED ORDER — GABAPENTIN 600 MG PO TABS: 1200.0000 mg | ORAL_TABLET | Freq: Two times a day (BID) | ORAL | 0 refills | Status: DC

## 2020-08-14 MED ORDER — HYDROXYZINE HCL 25 MG PO TABS
25.0000 mg | ORAL_TABLET | Freq: Once | ORAL | Status: AC
  Administered 2020-08-14: 25 mg via ORAL
  Filled 2020-08-14: qty 1

## 2020-08-14 MED ORDER — FLUOXETINE HCL 20 MG PO CAPS: 60.0000 mg | ORAL_CAPSULE | Freq: Every day | ORAL | 0 refills | Status: DC

## 2020-08-14 MED ORDER — PREDNISONE 20 MG PO TABS: ORAL_TABLET | ORAL | 0 refills | Status: DC

## 2020-08-14 MED ORDER — HYDROXYZINE HCL 25 MG PO TABS: 25.0000 mg | ORAL_TABLET | Freq: Three times a day (TID) | ORAL | 0 refills | Status: DC | PRN

## 2020-08-14 MED ORDER — MIRTAZAPINE 30 MG PO TABS: 30.0000 mg | ORAL_TABLET | Freq: Every day | ORAL | 0 refills | Status: DC

## 2020-08-14 MED ORDER — ALBUTEROL SULFATE HFA 108 (90 BASE) MCG/ACT IN AERS
2.0000 | INHALATION_SPRAY | RESPIRATORY_TRACT | Status: DC | PRN
  Administered 2020-08-14: 2 via RESPIRATORY_TRACT
  Filled 2020-08-14: qty 6.7

## 2020-08-14 MED ORDER — LIDOCAINE VISCOUS HCL 2 % MT SOLN
15.0000 mL | Freq: Once | OROMUCOSAL | Status: AC
  Administered 2020-08-14: 15 mL via OROMUCOSAL
  Filled 2020-08-14: qty 15

## 2020-08-14 MED ORDER — CLONIDINE HCL 0.2 MG PO TABS: 0.2000 mg | ORAL_TABLET | Freq: Every day | ORAL | 0 refills | Status: DC

## 2020-08-14 NOTE — Progress Notes (Signed)
  [facility] Adult Case Management Discharge Plan :  Will you be returning to the same living situation after discharge:  Yes,  with family At discharge, do you have transportation home?: Yes,  mother/stepfather Do you have the ability to pay for your medications: No.  Needs to work with local agency  Release of information consent forms completed and emailed to Medical Records, then turned in to Medical Records by CSW.   Patient to Follow up at:  Follow-up Information    [facility]. Go on 08/23/2020.   Specialty: Behavioral Health Why: You have an appointment on 08/23/20 at 8:00 am for therapy services. This appointment will be held in person. You also have a VIRTUAL appointment for medication management on 09/07/20 at 2:10 pm. Contact information: [address] [place] [address] [phone]              Next level of care provider has access to [facility] Link:yes  Safety Planning and Suicide Prevention discussed: Yes,  with mother     Has patient been referred to the Quitline?: Patient refused referral  Patient has been referred for addiction treatment: Pt. refused referral  Lynnell Chad, LCSW 08/14/2020, 10:30 AM

## 2020-08-14 NOTE — ED Triage Notes (Signed)
Patient d/c from [facility] today. Reports sore throat x1 week after being intubated for OD on 11/10.

## 2020-08-14 NOTE — Discharge Instructions (Addendum)
Take prednisone as prescribed.  Use viscous lidocaine as needed for your sore throat.  Use albuterol inhaler 2 puffs every 4 hours as needed for shortness of breath or wheezing.  Follow-up with your doctor for further care.

## 2020-08-14 NOTE — Progress Notes (Signed)
   08/14/20 0557  Vital Signs  Temp 99.4 F (37.4 C)  Temp Source Oral  Pulse Rate 90  BP 105/79  BP Location Right Arm  BP Method Automatic  Patient Position (if appropriate) Lying   Pt still having pain from her throat. Rates pain this morning as 7/10. Temperature still slightly elevated at 99.4. Pt given Tylenol 650 mg, Cepacol lozenge and Vistaril (anxiety 3/10). Will continue to monitor.

## 2020-08-14 NOTE — ED Notes (Signed)
Patient transported to X-ray 

## 2020-08-14 NOTE — BHH Suicide Risk Assessment (Signed)
[facility] Discharge Suicide Risk Assessment   Principal Problem: Suicide attempt [facility]) Discharge Diagnoses: Principal Problem:   Suicide attempt [facility]) Active Problems:   Grief   PTSD (post-traumatic stress disorder)   Polysubstance abuse (HCC)   MDD (major depressive disorder), recurrent episode (HCC)   Total Time spent with patient: 20 minutes  Musculoskeletal: Strength & Muscle Tone: within normal limits Gait & Station: normal Patient leans: N/A  Psychiatric Specialty Exam: Review of Systems  All other systems reviewed and are negative.   Blood pressure 117/86, pulse 89, temperature 99.4 F (37.4 C), temperature source Oral, resp. rate 16, height 5\' 3"  (1.6 m), weight 70.3 kg, SpO2 98 %.Body mass index is 27.46 kg/m.  General Appearance: Casual  Eye Contact::  Good  Speech:  Normal Rate409  Volume:  Normal  Mood:  Anxious  Affect:  Congruent  Thought Process:  Coherent and Descriptions of Associations: Circumstantial  Orientation:  Full (Time, Place, and Person)  Thought Content:  Logical  Suicidal Thoughts:  No  Homicidal Thoughts:  No  Memory:  Immediate;   Fair Recent;   Fair Remote;   Fair  Judgement:  Intact  Insight:  Lacking  Psychomotor Activity:  Increased  Concentration:  Good  Recall:  Good  Fund of Knowledge:Good  Language: Good  Akathisia:  Negative  Handed:  Right  AIMS (if indicated):     Assets:  Desire for Improvement Housing Resilience Social Support  Sleep:  Number of Hours: 4.5  Cognition: WNL  ADL's:  Intact   Mental Status Per Nursing Assessment::   On Admission:  NA  Demographic Factors:  Divorced or widowed, Caucasian, Low socioeconomic status, Living alone and Unemployed  Loss Factors: Loss of significant relationship  Historical Factors: Impulsivity  Risk Reduction Factors:   Living with another person, especially a relative and Positive social support  Continued Clinical Symptoms:  Depression:   Comorbid alcohol  abuse/dependence Impulsivity Alcohol/Substance Abuse/Dependencies  Cognitive Features That Contribute To Risk:  None    Suicide Risk:  Minimal: No identifiable suicidal ideation.  Patients presenting with no risk factors but with morbid ruminations; may be classified as minimal risk based on the severity of the depressive symptoms   Follow-up Information    [facility]. Go on 08/23/2020.   Specialty: Behavioral Health Why: You have an appointment on 08/23/20 at 8:00 am for therapy services. This appointment will be held in person. You also have a VIRTUAL appointment for medication management on 09/07/20 at 2:10 pm. Contact information: [address] [place] [phone]              Plan Of Care/Follow-up recommendations:  Activity:  ad lib  [phone], MD 08/14/2020, 10:19 AM

## 2020-08-14 NOTE — Discharge Instructions (Signed)
Follow up with out patient provider

## 2020-08-14 NOTE — Progress Notes (Addendum)
[facility] MD Progress Note  08/14/2020 6:57 AM Margaret Mccann  MRN:  [id number] Subjective:   Patient has had sore throat since admission to [facility] due to intubation after overdose. She reported a fever overnight, measured temp was 99.4 degress F. On auscultation she has rare wheeze in all lung fields. In upper respiratory tract she has significant wheezing. Have ordered a rapid strep test and will have patient transferred to [facility] ED for further work up- Chest X, CBC, and any additional tests seen fit to order by provider.     Patient is not to receive any Benzodiazepines as this was the medication class that she overdosed on.       Principal Problem: Suicide attempt [facility]) Diagnosis: Principal Problem:   Suicide attempt [facility]) Active Problems:   Grief   PTSD (post-traumatic stress disorder)   Polysubstance abuse (HCC)   MDD (major depressive disorder), recurrent episode (HCC)  Total Time spent with patient: 30 minutes    Lauro Franklin, MD 08/14/2020, 6:57 AM
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
## 2020-08-14 NOTE — Progress Notes (Signed)
Patient denies SI/HI. Patient received both written and verbal discharge instructions. She verbalized understanding of discharge instructions. She agreed to f/u appt and med regimen. She received an AVS, SRA, transitional record and medications were sent electronically to the pt's pharmacy on file. The patient gathered belongings from room and locker. The patient was safely discharged to the lobby and will be transported to [facility] via [employer] [facility].   Report given to Patty, Press photographer at [employer].

## 2020-08-14 NOTE — ED Provider Notes (Signed)
[facility]-EMERGENCY DEPT Provider Note   CSN: [id number] Arrival date & time: 08/14/20  1059     History Chief Complaint  Patient presents with  . Sore Throat    Margaret Mccann is a 49 y.o. female.  The history is provided by the patient and medical records. No language interpreter was used.  Sore Throat     49 year old female with history of anxiety, bipolar, presents for evaluation of sore throat.  10 days ago patient was hospitalized for intentional drug overdose requiring intubation.  She was discharged from behavioral health today but was sent here for evaluation of sore throat and shortness of breath.  Patient states that she was extubated she has had sore throat, hoarseness and now she is having increased cough as well as shortness of breath and throat irritation.  She report hurts when she talks, it also hurts when she breathes.  Symptoms are similar to prior bronchitis she had in the past.  Furthermore she report feeling anxious.  She mention a strep test was obtained at the facility today but she does not know the results.  She has been fully vaccinated for COVID-19.  She denies any history of asthma or COPD.  Past Medical History:  Diagnosis Date  . Anxiety   . Bipolar 1 disorder ([facility])   . Depression   . Morgellons syndrome     Patient Active Problem List   Diagnosis Date Noted  . MDD (major depressive disorder), recurrent episode ([facility]) 08/11/2020  . Intentional drug overdose ([facility]) 08/04/2020  . Hypokalemia   . Acute respiratory failure with hypoxia ([facility])   . Polysubstance abuse ([facility])   . MDD (major depressive disorder), recurrent episode, moderate ([facility]) 07/30/2020  . Grief 07/30/2020  . PTSD (post-traumatic stress disorder) 07/30/2020  . MDD (major depressive disorder), recurrent episode, severe ([facility]) 06/04/2018  . Schizophrenia ([facility]) 06/04/2018  . Suicide attempt ([facility])   . Seizure (H[facility]) 06/02/2018  . Chest pain 07/13/2015  .  Palpitations 07/13/2015  . Cocaine abuse ([facility]) 06/03/2012  . 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 APPENDECTOMY       OB History   No obstetric history on file.     Family History  Problem Relation Age of Onset  . Heart attack Father 77  . Stroke Father   . Breast cancer Paternal Aunt   . Ulcerative colitis Neg Hx   . Esophageal cancer Neg Hx     Social History   Tobacco Use  . Smoking status: Former Smoker    Packs/day: 0.50    Types: Cigarettes  . Smokeless tobacco: Never Used  Vaping Use  . Vaping Use: Never used  Substance Use Topics  . Alcohol use: Yes    Comment: occas  . Drug use: Not Currently    Comment: not currently-was +cocaine and amphetamines, clean x3 years    Home Medications Prior to Admission medications   Medication Sig Start Date End Date Taking? Authorizing Provider  cloNIDine (CATAPRES) 0.2 MG tablet Take 1 tablet (0.2 mg total) by mouth at bedtime. 08/14/20   Antonieta Pert, MD  FLUoxetine (PROZAC) 20 MG capsule Take 3 capsules (60 mg total) by mouth daily. 08/15/20   Antonieta Pert, MD  gabapentin (NEURONTIN) 600 MG tablet Take 2 tablets (1,200 mg total) by mouth 2 (two) times daily. 08/14/20   Antonieta Pert, MD  hydrOXYzine (ATARAX/VISTARIL) 25 MG tablet Take 1 tablet (25 mg total) by mouth 3 (three) times daily as needed for anxiety. 08/14/20   Antonieta Pert, MD  menthol-cetylpyridinium (CEPACOL) 3 MG lozenge Take 1 lozenge (3 mg total) by mouth as needed for sore throat. 08/14/20   Antonieta Pert, MD  mirtazapine (REMERON) 30 MG tablet Take 1 tablet (30 mg total) by mouth at bedtime. 08/14/20   Antonieta Pert, MD    Allergies    Lamictal [lamotrigine], Lamictal [lamotrigine], Tramadol, Sulfamethoxazole-trimethoprim, Erythromycin, Erythromycin base, Geodon [ziprasidone  hcl], Levetiracetam, and Sulfa antibiotics  Review of Systems   Review of Systems  All other systems reviewed and are negative.   Physical Exam Updated Vital Signs BP (!) 148/111   Pulse 99   Temp 98.1 F (36.7 C) (Oral)   Resp 16   SpO2 95%   Physical Exam Vitals and nursing note reviewed.  Constitutional:      General: She is not in acute distress.    Appearance: She is well-developed.     Comments: Patient is hoarse.  HENT:     Head: Atraumatic.     Mouth/Throat:     Mouth: Mucous membranes are moist.     Pharynx: Uvula midline.  Eyes:     Conjunctiva/sclera: Conjunctivae normal.  Cardiovascular:     Rate and Rhythm: Normal rate and regular rhythm.     Pulses: Normal pulses.     Heart sounds: Normal heart sounds.  Pulmonary:     Effort: Pulmonary effort is normal.     Breath sounds: No stridor. Wheezing present. No rhonchi or rales.  Abdominal:     Palpations: Abdomen is soft.     Tenderness: There is no abdominal tenderness.  Musculoskeletal:     Cervical back: Neck supple.  Skin:    Findings: No rash.  Neurological:     Mental Status: She is alert. Mental status is at baseline.  Psychiatric:        Mood and Affect: Mood normal.     ED Results / Procedures / Treatments   Labs (all labs ordered are listed, but only abnormal results are displayed) Labs Reviewed - No data to display  EKG None  Radiology DG Chest 2 View  Result Date: 08/14/2020 CLINICAL DATA:  Cough and shortness of breath. EXAM: CHEST - 2 VIEW COMPARISON:  August 07, 2020 FINDINGS: The heart size and mediastinal contours are within normal limits. Both lungs are clear. The visualized skeletal structures are unremarkable. IMPRESSION: No active cardiopulmonary disease. Electronically Signed   By: Gerome Sam III M.D   On: 08/14/2020 13:17    Procedures Procedures (including critical care time)  Medications Ordered in ED Medications  albuterol (VENTOLIN HFA) 108 (90 Base)  MCG/ACT inhaler 2 puff (has no administration in time range)  hydrOXYzine (ATARAX/VISTARIL) tablet 25 mg (25 mg Oral Given 08/14/20 1226)  lidocaine (XYLOCAINE) 2 % viscous mouth solution 15 mL (15 mLs Mouth/Throat Given 08/14/20 1226)    ED Course  I have reviewed the triage vital signs and the nursing notes.  Pertinent labs & imaging results that were available during my care of the patient were reviewed by me and considered in my medical decision making (see chart for details).    MDM Rules/Calculators/A&P                          BP (!) 148/111   Pulse 99   Temp 98.1 F (  36.7 C) (Oral)   Resp 16   SpO2 95%   Final Clinical Impression(s) / ED Diagnoses Final diagnoses:  Sore throat  Hoarseness    Rx / DC Orders ED Discharge Orders         Ordered    predniSONE (DELTASONE) 20 MG tablet        08/14/20 1330    lidocaine (XYLOCAINE) 2 % solution  As needed        08/14/20 1330         12:25 PM Patient here with sore throat from recent intubation due to drug overdose.  She also endorsed having hoarseness and having chest discomfort.  Will get chest x-ray.  She also report anxiety due to her recent bouts of depression.  Vistaril given.  1:28 PM Chest x-ray unremarkable.  No hypoxia.  Will discharge home with albuterol, steroids, as well as viscous lidocaine.  Albuterol inhaler provided.  Return if symptoms worsen.   Fayrene Helper, PA-C 08/14/20 1331    Melene Plan, DO 08/14/20 1334

## 2020-08-23 ENCOUNTER — Ambulatory Visit (INDEPENDENT_AMBULATORY_CARE_PROVIDER_SITE_OTHER): Payer: No Payment, Other | Admitting: Licensed Clinical Social Worker

## 2020-08-23 ENCOUNTER — Other Ambulatory Visit: Payer: Self-pay

## 2020-08-23 DIAGNOSIS — F332 Major depressive disorder, recurrent severe without psychotic features: Secondary | ICD-10-CM

## 2020-08-23 NOTE — Progress Notes (Signed)
   THERAPIST PROGRESS NOTE  Session Time: 55 min  Participation Level: Active  Behavioral Response: CasualAlertAnxious, Depressed and Dysphoric  Type of Therapy: Individual Therapy  Treatment Goals addressed: Communication: Depression, Anxiety, Grief, Coping  Interventions: CBT, Motivational Interviewing and Supportive  Summary: Margaret Mccann is a 49 y.o. female who presents with hx of MDD. This date pt comes for in person session. She states she is "feeling the best I have felt since Jobe died". Pt clearly still in some times of disbelief r/t his death. Pt recently hospitalized d/t OD, intentional suicide attempt which was addressed in detail. She is able to recognize she was extremely close to death. She states relief that she is living and regret at what she did. LCSW assessed for thought processes r/t suicide attempt given pt said she was resolved more than ever to never try suicide again after Jobe's recent suicide. Pt advises she made suicide attempt 2 days after coming back from her unplanned stay with a friend in [place] where she was drinking heavily, denies drug use. She states she was feeling "demoralized" she had to ask her mom and stepfather to accept her back into their home after disappearing on them without notice or contact for several days. Pt today states she is committed to changing her life. Her stated goals are to "Live", stay sober and maintain good self care. She states she is trying to focus on self talk since becoming more aware of same and realizing how very negative her self talk usually is. Pt completely denies current SI. She makes verbal agreement to call 911, come to crisis unit or call suicide hotline (# in phone) if she has even the least thoughts of self harm. Pt advises she is trying to cope by taking one day at a time. She denies any substance use since hosp d/c. She states she is taking meds as prescribed and has control of own meds. She is getting up doing  basic self care, showering, meals, etc. She is walking the dog regularly. Pt advises she got to see both of her children on Thanksgiving. She states they came to her mother's and they all were able to eat together. Pt says "It is the best Thanksgiving I have had in at least 3 yrs". In further assessment of coping pt advises she picked up adult coloring in hospital and is finding this a soothing distraction. LCSW spoke with pt about PHP which pt is interested in. LCSW to get start date and times and call pt with details. LCSW reviewed poc with pt's verbal agreement. Pt states appreciation for care. Collaboration with Dr. Evelene Croon post session.   Suicidal/Homicidal: Nowithout intent/plan  Therapist Response: Pt remains receptive to care.  Plan: Return again for next available or walk in prn. Considering PHP.  Diagnosis: Axis I: MDD, recurrent, severe    Axis II: Deferred  Pekin Sink, LCSW 08/23/2020

## 2020-08-24 ENCOUNTER — Encounter (HOSPITAL_COMMUNITY): Payer: Self-pay

## 2020-08-24 ENCOUNTER — Ambulatory Visit (HOSPITAL_COMMUNITY)
Admission: RE | Admit: 2020-08-24 | Discharge: 2020-08-24 | Disposition: A | Discharge status: Home / Self Care | Payer: Self-pay | Source: Ambulatory Visit | Attending: Family Medicine | Admitting: Family Medicine

## 2020-08-24 VITALS — BP 153/91 | HR 84 | Temp 98.1°F | Resp 14

## 2020-08-24 DIAGNOSIS — J029 Acute pharyngitis, unspecified: Secondary | ICD-10-CM

## 2020-08-24 MED ORDER — PREDNISONE 5 MG PO TABS: ORAL_TABLET | ORAL | 0 refills | Status: DC

## 2020-08-24 NOTE — ED Triage Notes (Signed)
Pt presents with a cough and sore throat x 3 weeks. Pt states she was intubated this month. Pt states she has tried bio-freeze but it has caused burning marks. Pt states she has taken tea with honey and it has not relieved the pain, nor has any other OTC medications. She states the ED denies providing her with antibiotics. Pt states she was strep tested and covid tested, both resulted negative. Pt reports having bilateral earaches x 2 weeks. Pt reports thick yellow discharge from her throat when drinking hot tea and gargling with betadine. Pt states she has not been able to eat. She states lidocaine does not relieve the pain.

## 2020-08-24 NOTE — Discharge Instructions (Addendum)
Please continue to gargle  Please try cold drinks and foods.  Please try to be seen by ENT  Please be seen in the emergency department if your symptoms worsen.

## 2020-08-24 NOTE — ED Provider Notes (Signed)
[facility]    CSN: [id number] Arrival date & time: 08/24/20  1646      History   Chief Complaint Chief Complaint  Patient presents with  . Sore Throat  . Otalgia  . Fever  . Cough    HPI Margaret Mccann is a 49 y.o. female. she is presenting with sore throat and painful right ear.  She was recently intubated a few weeks ago from a self-inflicted intentional overdose.  She has had ongoing swelling and pain of the throat since that time.  She has tried a short course of prednisone with limited improvement.  She has had a rapid strep test that was negative as well as Covid test that was negative.  Reports subjective fevers.  No history of throat surgery.  HPI  Past Medical History:  Diagnosis Date  . Anxiety   . Bipolar 1 disorder (HCC)   . Depression   . Morgellons syndrome     Patient Active Problem List   Diagnosis Date Noted  . Intentional drug overdose (HCC) 08/04/2020  . Grief 07/30/2020  . PTSD (post-traumatic stress disorder) 07/30/2020  . Major depressive disorder, recurrent severe without psychotic features (HCC) 06/04/2018  . Suicide attempt (HCC)   . Seizure (HCC) 06/02/2018  . Cocaine abuse (HCC) 06/03/2012  . 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 APPENDECTOMY      OB History   No obstetric history on file.      Home Medications    Prior to Admission medications   Medication Sig Start Date End Date Taking? Authorizing Provider  FLUoxetine (PROZAC) 20 MG capsule Take 3 capsules (60 mg total) by mouth daily. 08/15/20  Yes Antonieta Pert, MD  gabapentin (NEURONTIN) 600 MG tablet Take 2 tablets (1,200 mg total) by mouth 2 (two) times daily. 08/14/20  Yes Antonieta Pert, MD  cloNIDine (CATAPRES) 0.2 MG tablet Take 1 tablet (0.2 mg total) by mouth at bedtime. 08/14/20   Antonieta Pert, MD  hydrOXYzine (ATARAX/VISTARIL) 25 MG tablet Take 1 tablet (25 mg total) by mouth 3 (three) times daily as needed for anxiety. 08/14/20   Antonieta Pert, MD  lidocaine (XYLOCAINE) 2 % solution Use as directed 15 mLs in the mouth or throat as needed for mouth pain. 08/14/20   Fayrene Helper, PA-C  menthol-cetylpyridinium (CEPACOL) 3 MG lozenge Take 1 lozenge (3 mg total) by mouth as needed for sore throat. 08/14/20   Antonieta Pert, MD  mirtazapine (REMERON) 30 MG tablet Take 1 tablet (30 mg total) by mouth at bedtime. 08/14/20   Antonieta Pert, MD  predniSONE (DELTASONE) 5 MG tablet Take 6 pills for first day, 5 pills second day, 4 pills third day, 3 pills fourth day, 2 pills the fifth day, and 1 pill sixth day. 08/24/20   Myra Rude, MD    Family History Family History  Problem Relation Age of Onset  . Heart attack Father 103  . Stroke Father   . Breast cancer Paternal Aunt   . Ulcerative colitis Neg Hx   . Esophageal cancer Neg Hx     Social History Social History   Tobacco Use  . Smoking status: Former Smoker    Packs/day: 0.50    Types: Cigarettes  . Smokeless tobacco: Never Used  Vaping Use  . Vaping Use: Never used  Substance Use Topics  .  Alcohol use: Yes    Comment: occas  . Drug use: Not Currently    Comment: not currently-was +cocaine and amphetamines, clean x3 years     Allergies   Lamictal [lamotrigine], Lamictal [lamotrigine], Tramadol, Sulfamethoxazole-trimethoprim, Erythromycin, Erythromycin base, Geodon [ziprasidone hcl], Levetiracetam, and Sulfa antibiotics   Review of Systems Review of Systems  See HPI  Physical Exam Triage Vital Signs ED Triage Vitals  Enc Vitals Group     BP 08/24/20 1738 (!) 153/91     Pulse Rate 08/24/20 1738 84     Resp 08/24/20 1738 14     Temp 08/24/20 1738 98.1 F (36.7 C)     Temp Source 08/24/20 1738 Oral     SpO2 08/24/20 1738 98 %     Weight --      Height --      Head Circumference  --      Peak Flow --      Pain Score 08/24/20 1732 9     Pain Loc --      Pain Edu? --      Excl. in GC? --    No data found.  Updated Vital Signs BP (!) 153/91 (BP Location: Right Arm)   Pulse 84   Temp 98.1 F (36.7 C) (Oral)   Resp 14   SpO2 98%   Visual Acuity Right Eye Distance:   Left Eye Distance:   Bilateral Distance:    Right Eye Near:   Left Eye Near:    Bilateral Near:     Physical Exam Gen: NAD, alert, cooperative with exam, well-appearing ENT: normal lips, normal nasal mucosa, no tonsillar exudates, no cervical adenopathy, normal tympanic membranes bilaterally Eye: normal EOM, normal conjunctiva and lids CV: Regular rate and rhythm Resp: no accessory muscle use, non-labored, clear to auscultation Skin: no rashes, no areas of induration  Neuro: normal tone, normal sensation to touch    UC Treatments / Results  Labs (all labs ordered are listed, but only abnormal results are displayed) Labs Reviewed - No data to display  EKG   Radiology No results found.  Procedures Procedures (including critical care time)  Medications Ordered in UC Medications - No data to display  Initial Impression / Assessment and Plan / UC Course  I have reviewed the triage vital signs and the nursing notes.  Pertinent labs & imaging results that were available during my care of the patient were reviewed by me and considered in my medical decision making (see chart for details).     [name] is a 49 year old female is presenting with ongoing sore throat.  She reports her symptoms began prior to being intubated.  She has malaise and fatigue.  Recent strep testing was negative as well as Covid testing was negative.  She feels pain around the foot itself.  Unclear if this is just ongoing swelling from manipulation with recent invasion.  Does not appear to be infectious at this time.  Will provide with prednisone.  Referral to ENT.  May need to consider CT scan of the  neck.  Counseled supportive care.  Given patient's continued care.  Final Clinical Impressions(s) / UC Diagnoses   Final diagnoses:  Sore throat     Discharge Instructions     Please continue to gargle  Please try cold drinks and foods.  Please try to be seen by ENT  Please be seen in the emergency department if your symptoms worsen.     ED Prescriptions  Medication Sig Dispense Auth. Provider   predniSONE (DELTASONE) 5 MG tablet Take 6 pills for first day, 5 pills second day, 4 pills third day, 3 pills fourth day, 2 pills the fifth day, and 1 pill sixth day. 21 tablet Myra Rude, MD     PDMP not reviewed this encounter.   Myra Rude, MD 08/24/20 [phone]

## 2020-08-26 ENCOUNTER — Telehealth (HOSPITAL_COMMUNITY): Payer: Self-pay | Admitting: Licensed Clinical Social Worker

## 2020-08-26 NOTE — Telephone Encounter (Signed)
LCSW placed call to pt's phone which is currently not in service. Placed call to land line pt provided at her mother's home. VM not set up. Placed call to pt's mother's phone and was able to lvm asking for Nickie to call this clinician back, # provided.

## 2020-08-27 ENCOUNTER — Telehealth (HOSPITAL_COMMUNITY): Payer: Self-pay | Admitting: Professional

## 2020-09-07 ENCOUNTER — Telehealth (INDEPENDENT_AMBULATORY_CARE_PROVIDER_SITE_OTHER): Payer: No Payment, Other | Admitting: Psychiatry

## 2020-09-07 ENCOUNTER — Other Ambulatory Visit: Payer: Self-pay

## 2020-09-07 ENCOUNTER — Encounter (HOSPITAL_COMMUNITY): Payer: Self-pay | Admitting: Psychiatry

## 2020-09-07 DIAGNOSIS — F339 Major depressive disorder, recurrent, unspecified: Secondary | ICD-10-CM | POA: Diagnosis not present

## 2020-09-07 DIAGNOSIS — F4310 Post-traumatic stress disorder, unspecified: Secondary | ICD-10-CM

## 2020-09-07 DIAGNOSIS — F4321 Adjustment disorder with depressed mood: Secondary | ICD-10-CM

## 2020-09-07 MED ORDER — GABAPENTIN 600 MG PO TABS: 1200.0000 mg | ORAL_TABLET | Freq: Two times a day (BID) | ORAL | 0 refills | Status: DC

## 2020-09-07 MED ORDER — CLONAZEPAM 0.5 MG PO TABS: 0.5000 mg | ORAL_TABLET | Freq: Two times a day (BID) | ORAL | 1 refills | Status: DC

## 2020-09-07 MED ORDER — MIRTAZAPINE 30 MG PO TABS: 30.0000 mg | ORAL_TABLET | Freq: Every day | ORAL | 0 refills | Status: DC

## 2020-09-07 MED ORDER — HYDROXYZINE HCL 25 MG PO TABS: 25.0000 mg | ORAL_TABLET | Freq: Three times a day (TID) | ORAL | 0 refills | Status: DC | PRN

## 2020-09-07 MED ORDER — FLUOXETINE HCL 20 MG PO CAPS: 60.0000 mg | ORAL_CAPSULE | Freq: Every day | ORAL | 0 refills | Status: DC

## 2020-09-07 MED ORDER — CLONIDINE HCL 0.2 MG PO TABS: 0.2000 mg | ORAL_TABLET | Freq: Every day | ORAL | 0 refills | Status: DC

## 2020-09-07 NOTE — Progress Notes (Signed)
[facility] OP Progress Note  Virtual Visit via Video Note  I connected with Margaret Mccann on 09/07/20 at  2:10 PM EST by a video enabled telemedicine application and verified that I am speaking with the correct person using two identifiers.  Location: Patient: Home Provider: Clinic   I discussed the limitations of evaluation and management by telemedicine and the availability of in person appointments. The patient expressed understanding and agreed to proceed.  I provided 26 minutes of non-face-to-face time during this encounter. Extensive amount of time was spent in reviewing the EMR pertaining to her recent hospitalization.    Patient Identification: Margaret Mccann MRN:  [id number] Date of Evaluation:  09/07/2020    Chief Complaint:  " I am just hanging in there."  Visit Diagnosis:    ICD-10-CM   1. Major depressive disorder, recurrent episode with anxious distress (HCC)  F33.9   2. PTSD (post-traumatic stress disorder)  F43.10   3. Grief  F43.21     History of Present Illness: Patient was seen once for initial evaluation on November 5.  Patient was hospitalized 5 days later after she intentionally overdosed on several of her medications including clonazepam.  She was hospitalized and also had to undergo intubation for airway protection.  She was extubated on the same day and was stabilized in the ICU followed by medical floor.  After medical clearance patient was transferred to [facility] for psychiatry stabilization.  She was hospitalized at [facility] from November 17 to November 20. Patient was discharged on the same regimen that she went into the hospital with however she was recommended to cut down the dose of clonazepam to once a day with a plan to discontinue it in the future.  Patient has been seen by therapist Ms. Georgetta Haber since her discharge. Patient was seen for the first time today by the writer after the hospital discharge.  Patient stated that she still  depressed and undergoing grief of loss of her fianc.  She stated that sometimes she knows mom and does not know what to feel or what to do. She has been living with her parents and plans to do so until she feels better and is able to stand up on her feet. She stated that she is glad to be alive today.  She expressed remorse for her actions.  She stated that she continues to be depressed.  Writer went over all her medications with her.  Patient stated that she really needs to keep taking her mirtazapine along with the clonidine as well as the gabapentin as all these medicines help her with sleep at night.  She stated that she really needs to continue taking the gabapentin during the daytime as well as it helps her with anxiety.  She stated that she has tried several different medicines for sleep and nothing is helped her as well as this combination.  Regarding clonazepam, patient stated that she is still taking clonazepam 0.5 mg twice a day.  She stated that she has 3 bottles of all her medications except for clonazepam.  She has given the clonazepam bottle to be kept by her mother.  She stated that she has not had any urge to engage in any self-injurious behaviors or hurt herself or end her life since discharge. She denied any suicidal or homicidal ideations today.  Initially patient wanted to see if the writer can add any medication however when the writer went over all the medications writer brought up  the issue of patient being on too many medications.  Writer pointed out that she is on Prozac 60 mg which is the highest dose along with mirtazapine 30 mg which is a good dose as well.  Patient stated that she really needs to keep taking her mirtazapine as in the past when she got went without it she could not sleep.  At the end patient stated that she would like to keep the same regimen for now as she understands that she needs to work through the grief bedtime.  She was to keep seeing Ms. Hatch and  then continue the same regimen.  Past Psychiatric History: MDD , anxiety, PTSD, Substance abuse (cocaine, alcohol)  Previous Psychotropic Medications: Yes -In addition to the medicines she is currently taking she has also taken Seroquel and Wellbutrin in the past.  She informed that she felt a seizure after using Wellbutrin and therefore does not want to take it again.   Past Medical History:  Past Medical History:  Diagnosis Date  . Anxiety   . Bipolar 1 disorder (HCC)   . Depression   . Morgellons syndrome     Past Surgical History:  Procedure Laterality Date  . APPENDECTOMY      Family Psychiatric History: Sister - committed suicide in 2005 by overdosing.  Family History:  Family History  Problem Relation Age of Onset  . Heart attack Father 75  . Stroke Father   . Breast cancer Paternal Aunt   . Ulcerative colitis Neg Hx   . Esophageal cancer Neg Hx     Social History:   Social History   Socioeconomic History  . Marital status: Single    Spouse name: Not on file  . Number of children: Not on file  . Years of education: Not on file  . Highest education level: Not on file  Occupational History  . Not on file  Tobacco Use  . Smoking status: Former Smoker    Packs/day: 0.50    Types: Cigarettes  . Smokeless tobacco: Never Used  Vaping Use  . Vaping Use: Never used  Substance and Sexual Activity  . Alcohol use: Yes    Comment: occas  . Drug use: Not Currently    Comment: not currently-was +cocaine and amphetamines, clean x3 years  . Sexual activity: Yes    Birth control/protection: Pill  Other Topics Concern  . Not on file  Social History Narrative   ** Merged History Encounter **       Social Determinants of Health   Financial Resource Strain: Not on file  Food Insecurity: Not on file  Transportation Needs: Not on file  Physical Activity: Not on file  Stress: Not on file  Social Connections: Not on file    Additional Social History: Currently  living with her parents, recently lost her boyfriend of 3 years to suicide.  Is divorced and has 2 children who live with their father.  Sees her children and talks of them regularly.  Has chosen not to spend too much time with them lately due to her current state of grief.   Allergies:   Allergies  Allergen Reactions  . Lamictal [Lamotrigine] Anaphylaxis, Rash and Other (See Comments)    Stevens-Johnson syndrome and fevers, also  . Lamictal [Lamotrigine] Anaphylaxis, Rash and Other (See Comments)    Fevers and Stevens-Johnson syndrome, also  . Tramadol Other (See Comments)    Pt had a seizure after taking Tramadol!!  . Sulfamethoxazole-Trimethoprim Rash  . Erythromycin  Nausea And Vomiting  . Erythromycin Base Nausea And Vomiting  . Geodon [Ziprasidone Hcl] Rash  . Levetiracetam Anxiety  . Sulfa Antibiotics Rash    Metabolic Disorder Labs: No results found for: HGBA1C, MPG No results found for: PROLACTIN Lab Results  Component Value Date   TRIG 189 (H) 08/07/2020   No results found for: TSH  Therapeutic Level Labs: No results found for: LITHIUM No results found for: CBMZ No results found for: VALPROATE  Current Medications: Current Outpatient Medications  Medication Sig Dispense Refill  . cloNIDine (CATAPRES) 0.2 MG tablet Take 1 tablet (0.2 mg total) by mouth at bedtime. 30 tablet 0  . FLUoxetine (PROZAC) 20 MG capsule Take 3 capsules (60 mg total) by mouth daily. 30 capsule 0  . gabapentin (NEURONTIN) 600 MG tablet Take 2 tablets (1,200 mg total) by mouth 2 (two) times daily. 60 tablet 0  . hydrOXYzine (ATARAX/VISTARIL) 25 MG tablet Take 1 tablet (25 mg total) by mouth 3 (three) times daily as needed for anxiety. 30 tablet 0  . lidocaine (XYLOCAINE) 2 % solution Use as directed 15 mLs in the mouth or throat as needed for mouth pain. 200 mL 0  . menthol-cetylpyridinium (CEPACOL) 3 MG lozenge Take 1 lozenge (3 mg total) by mouth as needed for sore throat. 100 tablet 0  .  mirtazapine (REMERON) 30 MG tablet Take 1 tablet (30 mg total) by mouth at bedtime. 30 tablet 0  . predniSONE (DELTASONE) 5 MG tablet Take 6 pills for first day, 5 pills second day, 4 pills third day, 3 pills fourth day, 2 pills the fifth day, and 1 pill sixth day. 21 tablet 0   No current facility-administered medications for this visit.    Musculoskeletal: Strength & Muscle Tone: within normal limits Gait & Station: normal Patient leans: N/A  Psychiatric Specialty Exam: Review of Systems  There were no vitals taken for this visit.There is no height or weight on file to calculate BMI.  General Appearance: Fairly Groomed  Eye Contact:  Good  Speech:  Clear and Coherent and Normal Rate  Volume:  Normal  Mood:  Depressed  Affect:  Depressed and Flat  Thought Process:  Goal Directed and Descriptions of Associations: Intact  Orientation:  Full (Time, Place, and Person)  Thought Content:  Logical  Suicidal Thoughts:  No  Homicidal Thoughts:  No  Memory:  Immediate;   Good Recent;   Good  Judgement:  Fair  Insight:  Fair  Psychomotor Activity:  Normal  Concentration:  Concentration: Good and Attention Span: Good  Recall:  Good  Fund of Knowledge:Good  Language: Good  Akathisia:  Negative  Handed:  Right  AIMS (if indicated):  0  Assets:  Communication Skills Desire for Improvement Financial Resources/Insurance Housing Social Support  ADL's:  Intact  Cognition: WNL  Sleep:  Fair   Screenings: AIMS   Flowsheet Row [facility]  AIMS Total Score 0    AUDIT   Flowsheet Row [facility]) [facility]  Alcohol Use Disorder Identification Test Final Score (AUDIT) 11 0      Assessment and Plan: Patient seen after hospital discharge following an intentional  overdose of her medications including clonazepam in November.  Patient's hospital stay included a short period of intubation along with ICU stabilization followed by medical floor stay.  She was then  transferred to psychiatry unit where she was stabilized for 3 days.  Today, patient is still on the same regimen that she was on when she went into the hospital.  She is somewhat rigid regarding changing any medications.  She wants to continue the same regimen as she believes this combination is the best combination that has helped her.  She stated that she is no longer holding a bottle of clonazepam in her possession and that her mother is keeping the bottle with her to prevent a similar incident again.   Writer is concerned about polypharmacy however patient does not want to change or adjust any medications at this point.  1. Major depressive disorder, recurrent episode with anxious distress (HCC)  - cloNIDine (CATAPRES) 0.2 MG tablet; Take 1 tablet (0.2 mg total) by mouth at bedtime.  Dispense: 30 tablet; Refill: 1 - FLUoxetine (PROZAC) 40 MG capsule; Take 1 capsule (40 mg total) by mouth daily.  Dispense: 30 capsule; Refill: 1 - gabapentin (NEURONTIN) 600 MG tablet; Take 2 tablets (1,200 mg total) by mouth 2 (two) times daily.  Dispense: 120 tablet; Refill: 1 - mirtazapine (REMERON) 30 MG tablet; Take 1 tablet (30 mg total) by mouth at bedtime.  Dispense: 30 tablet; Refill: 1 - clonazePAM (KLONOPIN) 0.5 MG tablet; Take 1 tablet (0.5 mg total) by mouth 2 (two) times daily as needed for anxiety.  Dispense: 60 tablet; Refill: 1 - Hydroxyzine 25 mg TID PRN for anxiety.  2. Grief - Continue therapy with Ms. Hatch.   3. PTSD (post-traumatic stress disorder)  - cloNIDine (CATAPRES) 0.2 MG tablet; Take 1 tablet (0.2 mg total) by mouth at bedtime.  Dispense: 30 tablet; Refill: 1 - FLUoxetine (PROZAC) 40 MG capsule; Take 1 capsule (40 mg total) by mouth daily.  Dispense: 30 capsule; Refill: 1 - mirtazapine  (REMERON) 30 MG tablet; Take 1 tablet (30 mg total) by mouth at bedtime.  Dispense: 30 tablet; Refill: 1   Continuing same regimen for now. Writer wants to cut down her medications, however, pt does not want to change anything today. F/up in 6 weeks.   Zena AmosMandeep Deauna Yaw, MD 12/14/20212:27 PM

## 2020-09-10 ENCOUNTER — Encounter (HOSPITAL_COMMUNITY): Payer: Self-pay

## 2020-09-10 ENCOUNTER — Emergency Department (HOSPITAL_COMMUNITY)
Admission: EM | Admit: 2020-09-10 | Discharge: 2020-09-10 | Disposition: A | Discharge status: Home / Self Care | Payer: Self-pay | Attending: Emergency Medicine | Admitting: Emergency Medicine

## 2020-09-10 ENCOUNTER — Other Ambulatory Visit: Payer: Self-pay

## 2020-09-10 DIAGNOSIS — F329 Major depressive disorder, single episode, unspecified: Secondary | ICD-10-CM | POA: Insufficient documentation

## 2020-09-10 DIAGNOSIS — Z87891 Personal history of nicotine dependence: Secondary | ICD-10-CM | POA: Insufficient documentation

## 2020-09-10 DIAGNOSIS — Z20822 Contact with and (suspected) exposure to covid-19: Secondary | ICD-10-CM | POA: Insufficient documentation

## 2020-09-10 DIAGNOSIS — F32A Depression, unspecified: Secondary | ICD-10-CM

## 2020-09-10 LAB — CBC
HCT: 44.3 % (ref 36.0–46.0)
Hemoglobin: 14.5 g/dL (ref 12.0–15.0)
MCH: 31.9 pg (ref 26.0–34.0)
MCHC: 32.7 g/dL (ref 30.0–36.0)
MCV: 97.6 fL (ref 80.0–100.0)
Platelets: 334 10*3/uL (ref 150–400)
RBC: 4.54 MIL/uL (ref 3.87–5.11)
RDW: 16.6 % — ABNORMAL HIGH (ref 11.5–15.5)
WBC: 11.5 10*3/uL — ABNORMAL HIGH (ref 4.0–10.5)
nRBC: 0 % (ref 0.0–0.2)

## 2020-09-10 LAB — COMPREHENSIVE METABOLIC PANEL
ALT: 37 U/L (ref 0–44)
AST: 29 U/L (ref 15–41)
Albumin: 4.5 g/dL (ref 3.5–5.0)
Alkaline Phosphatase: 95 U/L (ref 38–126)
Anion gap: 11 (ref 5–15)
BUN: 21 mg/dL — ABNORMAL HIGH (ref 6–20)
CO2: 27 mmol/L (ref 22–32)
Calcium: 9.6 mg/dL (ref 8.9–10.3)
Chloride: 102 mmol/L (ref 98–111)
Creatinine, Ser: 0.77 mg/dL (ref 0.44–1.00)
GFR, Estimated: >60 mL/min (ref >60)
Glucose, Bld: 85 mg/dL (ref 70–99)
Potassium: 4 mmol/L (ref 3.5–5.1)
Sodium: 140 mmol/L (ref 135–145)
Total Bilirubin: 0.5 mg/dL (ref 0.3–1.2)
Total Protein: 8.2 g/dL — ABNORMAL HIGH (ref 6.5–8.1)

## 2020-09-10 LAB — RAPID URINE DRUG SCREEN, HOSP PERFORMED
Amphetamines: NOT DETECTED
Barbiturates: NOT DETECTED
Benzodiazepines: POSITIVE — AB
Cocaine: NOT DETECTED
Opiates: NOT DETECTED
Tetrahydrocannabinol: NOT DETECTED

## 2020-09-10 LAB — RESP PANEL BY RT-PCR (FLU A&B, COVID) ARPGX2
Influenza A by PCR: NEGATIVE
Influenza B by PCR: NEGATIVE
SARS Coronavirus 2 by RT PCR: NEGATIVE

## 2020-09-10 LAB — ACETAMINOPHEN LEVEL: Acetaminophen (Tylenol), Serum: <10 ug/mL — ABNORMAL LOW (ref 10–30)

## 2020-09-10 LAB — ETHANOL: Alcohol, Ethyl (B): <10 mg/dL (ref <10)

## 2020-09-10 LAB — I-STAT BETA HCG BLOOD, ED (MC, WL, AP ONLY): I-stat hCG, quantitative: <5 m[IU]/mL (ref <5)

## 2020-09-10 LAB — SALICYLATE LEVEL: Salicylate Lvl: <7 mg/dL — ABNORMAL LOW (ref 7.0–30.0)

## 2020-09-10 MED ORDER — CLONAZEPAM 0.5 MG PO TABS
0.5000 mg | ORAL_TABLET | Freq: Two times a day (BID) | ORAL | Status: DC
  Administered 2020-09-10: 0.5 mg via ORAL
  Filled 2020-09-10: qty 1

## 2020-09-10 MED ORDER — HYDROXYZINE HCL 25 MG PO TABS: 25.0000 mg | ORAL_TABLET | Freq: Three times a day (TID) | ORAL | Status: DC | PRN

## 2020-09-10 MED ORDER — MIRTAZAPINE 30 MG PO TABS
30.0000 mg | ORAL_TABLET | Freq: Every day | ORAL | Status: DC
  Administered 2020-09-10: 30 mg via ORAL
  Filled 2020-09-10: qty 1

## 2020-09-10 MED ORDER — FLUOXETINE HCL 20 MG PO CAPS
60.0000 mg | ORAL_CAPSULE | Freq: Every day | ORAL | Status: DC
  Administered 2020-09-10: 60 mg via ORAL
  Filled 2020-09-10: qty 3

## 2020-09-10 MED ORDER — ONDANSETRON HCL 4 MG PO TABS: 4.0000 mg | ORAL_TABLET | Freq: Three times a day (TID) | ORAL | Status: DC | PRN

## 2020-09-10 MED ORDER — ALUM & MAG HYDROXIDE-SIMETH 200-200-20 MG/5ML PO SUSP: 30.0000 mL | Freq: Four times a day (QID) | ORAL | Status: DC | PRN

## 2020-09-10 MED ORDER — GABAPENTIN 300 MG PO CAPS
1200.0000 mg | ORAL_CAPSULE | Freq: Two times a day (BID) | ORAL | Status: DC
  Administered 2020-09-10: 1200 mg via ORAL
  Filled 2020-09-10: qty 4

## 2020-09-10 MED ORDER — CLONIDINE HCL 0.1 MG PO TABS
0.2000 mg | ORAL_TABLET | Freq: Every day | ORAL | Status: DC
  Administered 2020-09-10: 0.2 mg via ORAL
  Filled 2020-09-10: qty 2

## 2020-09-10 MED ORDER — NICOTINE 21 MG/24HR TD PT24
21.0000 mg | MEDICATED_PATCH | Freq: Every day | TRANSDERMAL | Status: DC
  Administered 2020-09-10: 21 mg via TRANSDERMAL
  Filled 2020-09-10: qty 1

## 2020-09-10 MED ORDER — ACETAMINOPHEN 500 MG PO TABS: 1000.0000 mg | ORAL_TABLET | Freq: Four times a day (QID) | ORAL | Status: DC | PRN
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
## 2020-09-10 NOTE — Discharge Instructions (Addendum)
Please follow-up with your counselor/therapist soon as possible.  Return to the ER for any new or worsening symptoms including but not limited to thoughts of self-harm, thoughts of hurting others, or hearing/seeing voices.
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
## 2020-10-07 ENCOUNTER — Telehealth (HOSPITAL_COMMUNITY): Payer: Self-pay | Admitting: *Deleted

## 2020-10-07 DIAGNOSIS — F339 Major depressive disorder, recurrent, unspecified: Secondary | ICD-10-CM

## 2020-10-07 MED ORDER — GABAPENTIN 600 MG PO TABS: 1200.0000 mg | ORAL_TABLET | Freq: Two times a day (BID) | ORAL | 0 refills | Status: DC

## 2020-10-07 NOTE — Telephone Encounter (Signed)
Corrected RX sent to pharmacy.

## 2020-10-07 NOTE — Telephone Encounter (Signed)
Call from patient requesting Gabapentin, last rx was written for 60 and she takes 4 pills a day which would require a 120 pills for a month. Will bring this to Dr Magdalen Spatz attention.

## 2020-10-07 NOTE — Addendum Note (Signed)
Addended by: Zena Amos on: 10/07/2020 03:53 PM   Modules accepted: Orders

## 2020-10-14 ENCOUNTER — Other Ambulatory Visit: Payer: Self-pay

## 2020-10-14 ENCOUNTER — Ambulatory Visit (INDEPENDENT_AMBULATORY_CARE_PROVIDER_SITE_OTHER): Payer: No Payment, Other | Admitting: Licensed Clinical Social Worker

## 2020-10-14 DIAGNOSIS — F332 Major depressive disorder, recurrent severe without psychotic features: Secondary | ICD-10-CM | POA: Diagnosis not present

## 2020-10-18 ENCOUNTER — Telehealth (HOSPITAL_COMMUNITY): Payer: Self-pay | Admitting: Licensed Clinical Social Worker

## 2020-10-18 NOTE — Telephone Encounter (Signed)
LCSW phoned pt's mother who provides phone # reactivated for pt per plan discussed last session. LCSW phoned pt. Call goes to vm after multiple rings. LCSW left detailed vm re changes in PHP program. Advised there is a new group leader and possibility of other changes. LCSW advised this clinician reaching out to new group leader for details and will call her back with update asap. Left call back # for [facility] for pt to use prn for questions.

## 2020-10-19 ENCOUNTER — Telehealth (HOSPITAL_COMMUNITY): Payer: Self-pay | Admitting: Licensed Clinical Social Worker

## 2020-10-19 ENCOUNTER — Telehealth (INDEPENDENT_AMBULATORY_CARE_PROVIDER_SITE_OTHER): Payer: No Payment, Other | Admitting: Psychiatry

## 2020-10-19 ENCOUNTER — Other Ambulatory Visit: Payer: Self-pay

## 2020-10-19 ENCOUNTER — Encounter (HOSPITAL_COMMUNITY): Payer: Self-pay | Admitting: Psychiatry

## 2020-10-19 DIAGNOSIS — F339 Major depressive disorder, recurrent, unspecified: Secondary | ICD-10-CM

## 2020-10-19 DIAGNOSIS — F4310 Post-traumatic stress disorder, unspecified: Secondary | ICD-10-CM

## 2020-10-19 MED ORDER — GABAPENTIN 600 MG PO TABS: 1200.0000 mg | ORAL_TABLET | Freq: Two times a day (BID) | ORAL | 1 refills | Status: DC

## 2020-10-19 MED ORDER — HYDROXYZINE HCL 25 MG PO TABS: 25.0000 mg | ORAL_TABLET | Freq: Three times a day (TID) | ORAL | 1 refills | Status: DC | PRN

## 2020-10-19 MED ORDER — CLONAZEPAM 0.5 MG PO TABS: 0.5000 mg | ORAL_TABLET | Freq: Two times a day (BID) | ORAL | 1 refills | Status: DC

## 2020-10-19 MED ORDER — FLUOXETINE HCL 20 MG PO CAPS: 60.0000 mg | ORAL_CAPSULE | Freq: Every day | ORAL | 1 refills | Status: DC

## 2020-10-19 MED ORDER — MIRTAZAPINE 30 MG PO TABS: 30.0000 mg | ORAL_TABLET | Freq: Every day | ORAL | 1 refills | Status: DC

## 2020-10-19 MED ORDER — CLONIDINE HCL 0.2 MG PO TABS: 0.2000 mg | ORAL_TABLET | Freq: Every day | ORAL | 1 refills | Status: DC
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
## 2020-10-19 NOTE — Telephone Encounter (Signed)
LCSW called pt this morning to f/u on PHP referral. Call rang multiple times and went to vm. LCSW left detailed vm re group with new group leaders name. Pt advised she will get a call from [place], LCSW. Asked pt to keep her phone with her and on so as not to miss call.

## 2020-10-19 NOTE — Progress Notes (Signed)
BH OP Progress Note  Virtual Visit via Video Note  I connected with Margaret Mccann on 10/19/20 at  3:40 PM EST by a video enabled telemedicine application and verified that I am speaking with the correct person using two identifiers.  Location: Patient: Home Provider: Clinic   I discussed the limitations of evaluation and management by telemedicine and the availability of in person appointments. The patient expressed understanding and agreed to proceed.  I provided 17 minutes of non-face-to-face time during this encounter.    Patient Identification: Margaret Mccann MRN:  [id number] Date of Evaluation:  10/19/2020    Chief Complaint:  " I am doing fine."  Visit Diagnosis:    ICD-10-CM   1. Major depressive disorder, recurrent episode with anxious distress (HCC)  F33.9 clonazePAM (KLONOPIN) 0.5 MG tablet    cloNIDine (CATAPRES) 0.2 MG tablet    FLUoxetine (PROZAC) 20 MG capsule    gabapentin (NEURONTIN) 600 MG tablet    mirtazapine (REMERON) 30 MG tablet  2. PTSD (post-traumatic stress disorder)  F43.10 cloNIDine (CATAPRES) 0.2 MG tablet    FLUoxetine (PROZAC) 20 MG capsule    hydrOXYzine (ATARAX/VISTARIL) 25 MG tablet    mirtazapine (REMERON) 30 MG tablet    History of Present Illness: Patient reported that she is doing well.  She informed that she had a very rough time during the Christmas holidays but she got over that.  She informed that she recently moved out of her parents place and is now living with a close friend.  She stated that living with her parents was not working out for her due to a few reasons. She still in touch with her mom and her mother is the one who was picking up her prescriptions for her now.  She informed that she has made a Engineer, manufacturing with her mother as well as the friend that she is living in now that she will not do anything to hurt herself.  She stated that she has not had any urges to hurt herself over the last few weeks. She  stated that she really wants to get better and is very excited to start the partial hospitalization program with [facility]. She stated that the therapist from this clinic contacted her earlier today and confirmed her appointment.  As per EMR, patient will be starting the [facility] in the next few days. Patient stated that she wants to get back on the right track and she is willing to get all the help that she can get. She is still seeing Ms. Georgetta Haber regularly for individual therapy sessions.  She stated that for the time being she would really like to keep her medications just the way they are and does not want to change or adjust any doses.   Past Psychiatric History: MDD , anxiety, PTSD, Substance abuse (cocaine, alcohol)  Previous Psychotropic Medications: Yes -In addition to the medicines she is currently taking she has also taken Seroquel and Wellbutrin in the past.  She informed that she felt a seizure after using Wellbutrin and therefore does not want to take it again.   Past Medical History:  Past Medical History:  Diagnosis Date  . Anxiety   . Bipolar 1 disorder (HCC)   . Depression   . Morgellons syndrome     Past Surgical History:  Procedure Laterality Date  . APPENDECTOMY      Family Psychiatric History: Sister - committed suicide in 2005 by overdosing.  Family  History:  Family History  Problem Relation Age of Onset  . Heart attack Father 8150  . Stroke Father   . Breast cancer Paternal Aunt   . Ulcerative colitis Neg Hx   . Esophageal cancer Neg Hx     Social History:   Social History   Socioeconomic History  . Marital status: Single    Spouse name: Not on file  . Number of children: Not on file  . Years of education: Not on file  . Highest education level: Not on file  Occupational History  . Not on file  Tobacco Use  . Smoking status: Former Smoker    Packs/day: 0.50    Types: Cigarettes  . Smokeless tobacco:  Never Used  Vaping Use  . Vaping Use: Never used  Substance and Sexual Activity  . Alcohol use: Yes    Comment: occas  . Drug use: Not Currently    Comment: not currently-was +cocaine and amphetamines, clean x3 years  . Sexual activity: Yes    Birth control/protection: Pill  Other Topics Concern  . Not on file  Social History Narrative   ** Merged History Encounter **       Social Determinants of Health   Financial Resource Strain: Not on file  Food Insecurity: Not on file  Transportation Needs: Not on file  Physical Activity: Not on file  Stress: Not on file  Social Connections: Not on file    Additional Social History: Currently living with her parents, recently lost her boyfriend of 3 years to suicide.  Is divorced and has 2 children who live with their father.  Sees her children and talks of them regularly.  Has chosen not to spend too much time with them lately due to her current state of grief.   Allergies:   Allergies  Allergen Reactions  . Lamictal [Lamotrigine] Anaphylaxis, Rash and Other (See Comments)    Stevens-Johnson syndrome and fevers, also  . Lamictal [Lamotrigine] Anaphylaxis, Rash and Other (See Comments)    Fevers and Stevens-Johnson syndrome, also  . Tramadol Other (See Comments)    Pt had a seizure after taking Tramadol!!  . Sulfamethoxazole-Trimethoprim Rash  . Erythromycin Nausea And Vomiting  . Erythromycin Base Nausea And Vomiting  . Geodon [Ziprasidone Hcl] Rash  . Levetiracetam Anxiety  . Sulfa Antibiotics Rash    Metabolic Disorder Labs: No results found for: HGBA1C, MPG No results found for: PROLACTIN Lab Results  Component Value Date   TRIG 189 (H) 08/07/2020   No results found for: TSH  Therapeutic Level Labs: No results found for: LITHIUM No results found for: CBMZ No results found for: VALPROATE  Current Medications: Current Outpatient Medications  Medication Sig Dispense Refill  . acetaminophen (TYLENOL) 500 MG tablet  Take 1,000 mg by mouth every 6 (six) hours as needed for moderate pain.    . clonazePAM (KLONOPIN) 0.5 MG tablet Take 1 tablet (0.5 mg total) by mouth 2 (two) times daily. 60 tablet 1  . cloNIDine (CATAPRES) 0.2 MG tablet Take 1 tablet (0.2 mg total) by mouth at bedtime. 30 tablet 1  . FLUoxetine (PROZAC) 20 MG capsule Take 3 capsules (60 mg total) by mouth daily. 30 capsule 1  . gabapentin (NEURONTIN) 600 MG tablet Take 2 tablets (1,200 mg total) by mouth 2 (two) times daily. 120 tablet 1  . hydrOXYzine (ATARAX/VISTARIL) 25 MG tablet Take 1 tablet (25 mg total) by mouth 3 (three) times daily as needed for anxiety. 30 tablet 1  .  mirtazapine (REMERON) 30 MG tablet Take 1 tablet (30 mg total) by mouth at bedtime. 30 tablet 1   No current facility-administered medications for this visit.    Musculoskeletal: Strength & Muscle Tone: within normal limits Gait & Station: normal Patient leans: N/A  Psychiatric Specialty Exam: Review of Systems  There were no vitals taken for this visit.There is no height or weight on file to calculate BMI.  General Appearance: Fairly Groomed  Eye Contact:  Good  Speech:  Clear and Coherent and Normal Rate  Volume:  Normal  Mood: Less Depressed  Affect:  Depressed  Thought Process:  Goal Directed and Descriptions of Associations: Intact  Orientation:  Full (Time, Place, and Person)  Thought Content:  Logical  Suicidal Thoughts:  No  Homicidal Thoughts:  No  Memory:  Immediate;   Good Recent;   Good  Judgement:  Fair  Insight:  Fair  Psychomotor Activity:  Normal  Concentration:  Concentration: Good and Attention Span: Good  Recall:  Good  Fund of Knowledge:Good  Language: Good  Akathisia:  Negative  Handed:  Right  AIMS (if indicated):  0  Assets:  Communication Skills Desire for Improvement Financial Resources/Insurance Housing Social Support  ADL's:  Intact  Cognition: WNL  Sleep:  Fair   Screenings: AIMS   Flowsheet Row [facility]  AIMS Total Score 0    AUDIT   Flowsheet Row [facility]) [facility]  Alcohol Use Disorder Identification Test Final Score (AUDIT) 11 0      Assessment and Plan: Patient seems to be doing slightly better compared to her last visit with Clinical research associate.  She is now living with a different friend and no longer with her parents.  She states that she has no intent to hurt herself and will talk to someone if she has any such urges.  She is excited and looking forward to starting the [facility] partial hospitalization program that is scheduled to start virtually in the near future. Writer is still concerned about polypharmacy however patient insists that she does not want to change or adjust any medications at this point.  1. Major depressive disorder, recurrent episode with anxious distress (HCC)  - cloNIDine (CATAPRES) 0.2 MG tablet; Take 1 tablet (0.2 mg total) by mouth at bedtime.  Dispense: 30 tablet; Refill: 1 - FLUoxetine (PROZAC) 40 MG capsule; Take 1 capsule (40 mg total) by mouth daily.  Dispense: 30 capsule; Refill: 1 - gabapentin (NEURONTIN) 600 MG tablet; Take 2 tablets (1,200 mg total) by mouth 2 (two) times daily.  Dispense: 120 tablet; Refill: 1 - mirtazapine (REMERON) 30 MG tablet; Take 1 tablet (30 mg total) by mouth at bedtime.  Dispense: 30 tablet; Refill: 1 - clonazePAM (KLONOPIN) 0.5 MG tablet; Take 1 tablet (0.5 mg total) by mouth 2 (two) times daily as needed for anxiety.  Dispense: 60 tablet; Refill: 1 - Hydroxyzine 25 mg TID PRN for anxiety.  2. Grief - Continue therapy with Ms. Hatch.   3. PTSD (post-traumatic stress disorder)  - cloNIDine (CATAPRES) 0.2 MG tablet; Take 1 tablet (0.2 mg total) by mouth at bedtime.  Dispense: 30 tablet; Refill:  1 - FLUoxetine (PROZAC) 40 MG capsule; Take 1 capsule (40 mg total) by mouth daily.  Dispense: 30 capsule; Refill: 1 - mirtazapine (REMERON) 30 MG tablet; Take 1 tablet (30  mg total) by mouth at bedtime.  Dispense: 30 tablet; Refill: 1   Continuing same regimen for now.  Writer wants to cut down her medications, however, pt does not want to change anything today. Patient is scheduled to start to [facility] PHP in the next few days. F/up in 6 weeks.   Zena Amos, MD 1/25/20223:55 PM

## 2020-10-20 ENCOUNTER — Telehealth (HOSPITAL_COMMUNITY): Payer: Self-pay | Admitting: Licensed Clinical Social Worker

## 2020-10-20 ENCOUNTER — Ambulatory Visit (HOSPITAL_COMMUNITY): Payer: No Payment, Other

## 2020-10-20 ENCOUNTER — Other Ambulatory Visit: Payer: Self-pay

## 2020-10-25 ENCOUNTER — Ambulatory Visit (HOSPITAL_COMMUNITY): Payer: No Payment, Other

## 2020-10-26 ENCOUNTER — Telehealth (HOSPITAL_COMMUNITY): Payer: Self-pay | Admitting: Licensed Clinical Social Worker

## 2020-10-26 ENCOUNTER — Ambulatory Visit (HOSPITAL_COMMUNITY): Payer: No Payment, Other

## 2020-10-27 ENCOUNTER — Ambulatory Visit (HOSPITAL_COMMUNITY): Payer: No Payment, Other

## 2020-10-27 NOTE — Psych (Signed)
Patient was agreeable to establish services for the [facility] Partial Hospitalization Program, however the patient did not attend group sessions on 10/25/20, 10/26/20 or 10/27/20.    CSW attempted to contact the patient multiple times, however there has been no response so far.     Patient has been discharged from [facility] services at this time.       Baldo Daub, MSW, LCSW Clinical Child psychotherapist (Facility Based Crisis) [facility]

## 2020-10-28 ENCOUNTER — Ambulatory Visit (HOSPITAL_COMMUNITY): Payer: No Payment, Other

## 2020-10-29 ENCOUNTER — Ambulatory Visit (HOSPITAL_COMMUNITY): Payer: No Payment, Other

## 2020-11-01 ENCOUNTER — Ambulatory Visit (HOSPITAL_COMMUNITY): Payer: No Payment, Other

## 2020-11-02 ENCOUNTER — Ambulatory Visit (HOSPITAL_COMMUNITY): Payer: No Payment, Other

## 2020-11-03 ENCOUNTER — Ambulatory Visit (HOSPITAL_COMMUNITY): Payer: No Payment, Other

## 2020-11-04 ENCOUNTER — Ambulatory Visit (HOSPITAL_COMMUNITY): Payer: No Payment, Other | Admitting: Licensed Clinical Social Worker

## 2020-11-04 ENCOUNTER — Ambulatory Visit (HOSPITAL_COMMUNITY): Payer: No Payment, Other

## 2020-11-05 ENCOUNTER — Ambulatory Visit (HOSPITAL_COMMUNITY): Payer: No Payment, Other

## 2020-11-08 ENCOUNTER — Ambulatory Visit (HOSPITAL_COMMUNITY): Payer: No Payment, Other

## 2020-11-23 ENCOUNTER — Other Ambulatory Visit: Payer: Self-pay

## 2020-11-23 ENCOUNTER — Ambulatory Visit (INDEPENDENT_AMBULATORY_CARE_PROVIDER_SITE_OTHER): Payer: No Payment, Other | Admitting: Licensed Clinical Social Worker

## 2020-11-23 DIAGNOSIS — F332 Major depressive disorder, recurrent severe without psychotic features: Secondary | ICD-10-CM
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
## 2020-11-26 ENCOUNTER — Other Ambulatory Visit (HOSPITAL_COMMUNITY): Payer: Self-pay | Admitting: Psychiatry

## 2020-11-26 DIAGNOSIS — F339 Major depressive disorder, recurrent, unspecified: Secondary | ICD-10-CM

## 2020-11-26 DIAGNOSIS — F4310 Post-traumatic stress disorder, unspecified: Secondary | ICD-10-CM

## 2020-12-01 ENCOUNTER — Ambulatory Visit: Payer: Self-pay | Admitting: *Deleted

## 2020-12-01 ENCOUNTER — Encounter (HOSPITAL_COMMUNITY): Payer: Self-pay | Admitting: Emergency Medicine

## 2020-12-01 ENCOUNTER — Emergency Department (HOSPITAL_COMMUNITY)
Admission: EM | Admit: 2020-12-01 | Discharge: 2020-12-02 | Disposition: A | Discharge status: Home / Self Care | Payer: Self-pay | Attending: Emergency Medicine | Admitting: Emergency Medicine

## 2020-12-01 ENCOUNTER — Emergency Department (HOSPITAL_COMMUNITY): Payer: Self-pay

## 2020-12-01 DIAGNOSIS — Z87891 Personal history of nicotine dependence: Secondary | ICD-10-CM | POA: Insufficient documentation

## 2020-12-01 DIAGNOSIS — R110 Nausea: Secondary | ICD-10-CM | POA: Insufficient documentation

## 2020-12-01 DIAGNOSIS — R0602 Shortness of breath: Secondary | ICD-10-CM | POA: Insufficient documentation

## 2020-12-01 DIAGNOSIS — Z79899 Other long term (current) drug therapy: Secondary | ICD-10-CM | POA: Insufficient documentation

## 2020-12-01 DIAGNOSIS — R52 Pain, unspecified: Secondary | ICD-10-CM

## 2020-12-01 DIAGNOSIS — R079 Chest pain, unspecified: Secondary | ICD-10-CM

## 2020-12-01 DIAGNOSIS — I10 Essential (primary) hypertension: Secondary | ICD-10-CM | POA: Insufficient documentation

## 2020-12-01 LAB — BASIC METABOLIC PANEL
Anion gap: 11 (ref 5–15)
BUN: 5 mg/dL — ABNORMAL LOW (ref 6–20)
CO2: 21 mmol/L — ABNORMAL LOW (ref 22–32)
Calcium: 9.4 mg/dL (ref 8.9–10.3)
Chloride: 104 mmol/L (ref 98–111)
Creatinine, Ser: 0.7 mg/dL (ref 0.44–1.00)
GFR, Estimated: >60 mL/min (ref >60)
Glucose, Bld: 94 mg/dL (ref 70–99)
Potassium: 3.5 mmol/L (ref 3.5–5.1)
Sodium: 136 mmol/L (ref 135–145)

## 2020-12-01 LAB — CBC
HCT: 45.2 % (ref 36.0–46.0)
Hemoglobin: 15.3 g/dL — ABNORMAL HIGH (ref 12.0–15.0)
MCH: 31.9 pg (ref 26.0–34.0)
MCHC: 33.8 g/dL (ref 30.0–36.0)
MCV: 94.4 fL (ref 80.0–100.0)
Platelets: 376 10*3/uL (ref 150–400)
RBC: 4.79 MIL/uL (ref 3.87–5.11)
RDW: 14 % (ref 11.5–15.5)
WBC: 9 10*3/uL (ref 4.0–10.5)
nRBC: 0 % (ref 0.0–0.2)

## 2020-12-01 LAB — I-STAT BETA HCG BLOOD, ED (MC, WL, AP ONLY): I-stat hCG, quantitative: <5 m[IU]/mL (ref <5)

## 2020-12-01 LAB — TROPONIN I (HIGH SENSITIVITY): Troponin I (High Sensitivity): 4 ng/L (ref <18)

## 2020-12-01 NOTE — ED Triage Notes (Signed)
Patient complains of chest pain that started after a recreational walk today with her boyfriend. Pain persists even after resting, described as a cramping just left of the middle of her chest. Patient alert, oriented, and in no apparent distress at this time.

## 2020-12-01 NOTE — Telephone Encounter (Signed)
Patient calls with a B/P of 174/114 HR74 prior to a walk. B/P after walk this afternoon is 175/111 HR 77. At this time she has intermittent left sided CP and nausea. Took Clonidine 0.2 mg almost 2 hours ago. No PCP but sees Therapist, MD. Advised ED at this time for CP and HTN evaluation. Reason for Disposition . [1] Systolic BP  >= 160 OR Diastolic >= 100 AND [2] cardiac or neurologic symptoms (e.g., chest pain, difficulty breathing, unsteady gait, blurred vision)  Answer Assessment - Initial Assessment Questions 1. BLOOD PRESSURE: "What is the blood pressure?" "Did you take at least two measurements 5 minutes apart?"     150/109 then 175/111, HR74 2. ONSET: "When did you take your blood pressure?"    Yes 3. HOW: "How did you obtain the blood pressure?" (e.g., visiting nurse, automatic home BP monitor)     Home cuff 4. HISTORY: "Do you have a history of high blood pressure?"     yes 5. MEDICATIONS: "Are you taking any medications for blood pressure?" "Have you missed any doses recently?"     yes 6. OTHER SYMPTOMS: "Do you have any symptoms?" (e.g., headache, chest pain, blurred vision, difficulty breathing, weakness)     Chest pain 7. PREGNANCY: "Is there any chance you are pregnant?" "When was your last menstrual period?"     no  Protocols used: BLOOD PRESSURE - HIGH-A-AH

## 2020-12-01 NOTE — ED Notes (Signed)
Pt is complaining of increasing chest pain. RN notified.

## 2020-12-02 ENCOUNTER — Emergency Department (HOSPITAL_COMMUNITY): Payer: Self-pay

## 2020-12-02 ENCOUNTER — Other Ambulatory Visit: Payer: Self-pay

## 2020-12-02 ENCOUNTER — Telehealth (INDEPENDENT_AMBULATORY_CARE_PROVIDER_SITE_OTHER): Payer: No Payment, Other | Admitting: Psychiatry

## 2020-12-02 ENCOUNTER — Encounter (HOSPITAL_COMMUNITY): Payer: Self-pay | Admitting: Psychiatry

## 2020-12-02 DIAGNOSIS — F339 Major depressive disorder, recurrent, unspecified: Secondary | ICD-10-CM | POA: Diagnosis not present

## 2020-12-02 DIAGNOSIS — F4321 Adjustment disorder with depressed mood: Secondary | ICD-10-CM

## 2020-12-02 DIAGNOSIS — F4310 Post-traumatic stress disorder, unspecified: Secondary | ICD-10-CM | POA: Diagnosis not present

## 2020-12-02 LAB — TROPONIN I (HIGH SENSITIVITY): Troponin I (High Sensitivity): 4 ng/L (ref <18)

## 2020-12-02 MED ORDER — FLUOXETINE HCL 20 MG PO CAPS
60.0000 mg | ORAL_CAPSULE | Freq: Every day | ORAL | 1 refills | Status: DC
Start: 2020-12-02 — End: 2021-03-16

## 2020-12-02 MED ORDER — MIRTAZAPINE 30 MG PO TABS: 30.0000 mg | ORAL_TABLET | Freq: Every day | ORAL | 1 refills | Status: DC

## 2020-12-02 MED ORDER — HYDROXYZINE HCL 25 MG PO TABS: 25.0000 mg | ORAL_TABLET | Freq: Three times a day (TID) | ORAL | 1 refills | Status: DC | PRN

## 2020-12-02 MED ORDER — IOHEXOL 350 MG/ML SOLN
100.0000 mL | Freq: Once | INTRAVENOUS | Status: AC | PRN
  Administered 2020-12-02: 100 mL via INTRAVENOUS

## 2020-12-02 MED ORDER — CLONAZEPAM 0.5 MG PO TABS
0.5000 mg | ORAL_TABLET | Freq: Once | ORAL | Status: AC
  Administered 2020-12-02: 0.5 mg via ORAL
  Filled 2020-12-02: qty 1

## 2020-12-02 MED ORDER — CLONIDINE HCL 0.2 MG PO TABS: 0.2000 mg | ORAL_TABLET | Freq: Every day | ORAL | 1 refills | Status: DC

## 2020-12-02 MED ORDER — CLONAZEPAM 0.5 MG PO TABS: 0.5000 mg | ORAL_TABLET | Freq: Two times a day (BID) | ORAL | 1 refills | Status: DC

## 2020-12-02 MED ORDER — GABAPENTIN 600 MG PO TABS: 1200.0000 mg | ORAL_TABLET | Freq: Two times a day (BID) | ORAL | 1 refills | Status: DC

## 2020-12-02 MED ORDER — NITROGLYCERIN 0.4 MG SL SUBL
0.4000 mg | SUBLINGUAL_TABLET | SUBLINGUAL | Status: DC | PRN
  Administered 2020-12-02: 0.4 mg via SUBLINGUAL
  Filled 2020-12-02: qty 1

## 2020-12-02 NOTE — Progress Notes (Signed)
BH OP Progress Note  Virtual Visit via Video Note  I connected with Margaret Mccann on 12/02/20 at  1:30 PM EST by a video enabled telemedicine application and verified that I am speaking with the correct person using two identifiers.  Location: Patient: Home Provider: Clinic   I discussed the limitations of evaluation and management by telemedicine and the availability of in person appointments. The patient expressed understanding and agreed to proceed.  I provided 15 minutes of non-face-to-face time during this encounter.       Patient Identification: Margaret Mccann MRN:  [id number] Date of Evaluation:  12/02/2020    Chief Complaint:  " I am feeling fine."  Visit Diagnosis:    ICD-10-CM   1. Major depressive disorder, recurrent episode with anxious distress (HCC)  F33.9 clonazePAM (KLONOPIN) 0.5 MG tablet    cloNIDine (CATAPRES) 0.2 MG tablet    FLUoxetine (PROZAC) 20 MG capsule    gabapentin (NEURONTIN) 600 MG tablet    mirtazapine (REMERON) 30 MG tablet  2. PTSD (post-traumatic stress disorder)  F43.10 cloNIDine (CATAPRES) 0.2 MG tablet    FLUoxetine (PROZAC) 20 MG capsule    hydrOXYzine (ATARAX/VISTARIL) 25 MG tablet    mirtazapine (REMERON) 30 MG tablet  3. Grief  F43.21     History of Present Illness: Patient reported she is doing well in terms of her psychiatric symptom.  She stated that her mood has been stable and her anxiety is well controlled.  She still living with a friend who is supportive and helpful.  She still sees her mother regularly and spends time with her. She stated that she does not want to adjust any of her psychiatric medications as they are helpful.  She has been able to communicate with her children live locally, mostly texts them for communication.  She stated that she wanted the writer to know that she was in the ED last night because she felt nauseous and had a headache when she was doing a walk.  She checked her blood  pressure at home and it was very high and when she presented to the ED her blood pressure was noted to be 188/111.  She stated that she underwent CT brain and several other investigative lab work in the ED last night and was discharged with recommendation to follow-up with cardiology. She was given one-time dose of clonazepam in the ED and nitroglycerin. She is waiting to hear back regarding cardiology referral appointment. Writer asked her if that could have happened because of rebound effect of clonidine may be because she did not take a dose on the regular time and patient did not night that is stated that she has been taking all her medications at the same time every day.  Past Psychiatric History: MDD , anxiety, PTSD, Substance abuse (cocaine, alcohol)  Previous Psychotropic Medications: Yes -In addition to the medicines she is currently taking she has also taken Seroquel and Wellbutrin in the past.  She informed that she felt a seizure after using Wellbutrin and therefore does not want to take it again.   Past Medical History:  Past Medical History:  Diagnosis Date  . Anxiety   . Bipolar 1 disorder (HCC)   . Depression   . Morgellons syndrome     Past Surgical History:  Procedure Laterality Date  . APPENDECTOMY      Family Psychiatric History: Sister - committed suicide in 2005 by overdosing.  Family History:  Family History  Problem Relation Age of Onset  .  Heart attack Father 8050  . Stroke Father   . Breast cancer Paternal Aunt   . Ulcerative colitis Neg Hx   . Esophageal cancer Neg Hx     Social History:   Social History   Socioeconomic History  . Marital status: Single    Spouse name: Not on file  . Number of children: Not on file  . Years of education: Not on file  . Highest education level: Not on file  Occupational History  . Not on file  Tobacco Use  . Smoking status: Former Smoker    Packs/day: 0.50    Types: Cigarettes  . Smokeless tobacco: Never Used   Vaping Use  . Vaping Use: Never used  Substance and Sexual Activity  . Alcohol use: Yes    Comment: occas  . Drug use: Not Currently    Comment: not currently-was +cocaine and amphetamines, clean x3 years  . Sexual activity: Yes    Birth control/protection: Pill  Other Topics Concern  . Not on file  Social History Narrative   ** Merged History Encounter **       Social Determinants of Health   Financial Resource Strain: Not on file  Food Insecurity: Not on file  Transportation Needs: Not on file  Physical Activity: Not on file  Stress: Not on file  Social Connections: Not on file    Additional Social History: Currently living with her parents, recently lost her boyfriend of 3 years to suicide.  Is divorced and has 2 children who live with their father.  Sees her children and talks of them regularly.  Has chosen not to spend too much time with them lately due to her current state of grief.   Allergies:   Allergies  Allergen Reactions  . Lamictal [Lamotrigine] Anaphylaxis, Rash and Other (See Comments)    Stevens-Johnson syndrome and fevers, also  . Lamictal [Lamotrigine] Anaphylaxis, Rash and Other (See Comments)    Fevers and Stevens-Johnson syndrome, also  . Tramadol Other (See Comments)    Pt had a seizure after taking Tramadol!!  . Sulfamethoxazole-Trimethoprim Rash  . Erythromycin Nausea And Vomiting  . Erythromycin Base Nausea And Vomiting  . Geodon [Ziprasidone Hcl] Rash  . Levetiracetam Anxiety  . Sulfa Antibiotics Rash    Metabolic Disorder Labs: No results found for: HGBA1C, MPG No results found for: PROLACTIN Lab Results  Component Value Date   TRIG 189 (H) 08/07/2020   No results found for: TSH  Therapeutic Level Labs: No results found for: LITHIUM No results found for: CBMZ No results found for: VALPROATE  Current Medications: Current Outpatient Medications  Medication Sig Dispense Refill  . acetaminophen (TYLENOL) 500 MG tablet Take 1,000  mg by mouth every 6 (six) hours as needed for moderate pain.    . clonazePAM (KLONOPIN) 0.5 MG tablet Take 1 tablet (0.5 mg total) by mouth 2 (two) times daily. 60 tablet 1  . cloNIDine (CATAPRES) 0.2 MG tablet Take 1 tablet (0.2 mg total) by mouth at bedtime. 30 tablet 1  . FLUoxetine (PROZAC) 20 MG capsule Take 3 capsules (60 mg total) by mouth daily. 30 capsule 1  . gabapentin (NEURONTIN) 600 MG tablet Take 2 tablets (1,200 mg total) by mouth 2 (two) times daily. 120 tablet 1  . hydrOXYzine (ATARAX/VISTARIL) 25 MG tablet Take 1 tablet (25 mg total) by mouth 3 (three) times daily as needed for anxiety. 30 tablet 1  . mirtazapine (REMERON) 30 MG tablet Take 1 tablet (30 mg total) by  mouth at bedtime. 30 tablet 1   No current facility-administered medications for this visit.      Psychiatric Specialty Exam: Review of Systems  There were no vitals taken for this visit.There is no height or weight on file to calculate BMI.  General Appearance: Fairly Groomed  Eye Contact:  Good  Speech:  Clear and Coherent and Normal Rate  Volume:  Normal  Mood: Less Depressed  Affect:  Depressed  Thought Process:  Goal Directed and Descriptions of Associations: Intact  Orientation:  Full (Time, Place, and Person)  Thought Content:  Logical  Suicidal Thoughts:  No  Homicidal Thoughts:  No  Memory:  Immediate;   Good Recent;   Good  Judgement:  Fair  Insight:  Fair  Psychomotor Activity:  Normal  Concentration:  Concentration: Good and Attention Span: Good  Recall:  Good  Fund of Knowledge:Good  Language: Good  Akathisia:  Negative  Handed:  Right  AIMS (if indicated):  0  Assets:  Communication Skills Desire for Improvement Financial Resources/Insurance Housing Social Support  ADL's:  Intact  Cognition: WNL  Sleep:  Fair   Screenings: AIMS   Flowsheet Row [facility]  AIMS Total Score 0    AUDIT   Flowsheet  Row [facility] ([facility]  Alcohol Use Disorder Identification Test Final Score (AUDIT) 11 0    Flowsheet Row ED [facility] 12/01/2020 in [facility]) [facility]  C-SSRS RISK CATEGORY Error: Question 6 not populated High Risk No Risk      Assessment and Plan: Patient is reporting improvement in her depression and anxiety symptoms.  She still wants to continue the same regimen. Writer is still concerned about polypharmacy however patient insists that she does not want to change or adjust any medications at this point.  1. Major depressive disorder, recurrent episode with anxious distress (HCC)  - cloNIDine (CATAPRES) 0.2 MG tablet; Take 1 tablet (0.2 mg total) by mouth at bedtime.  Dispense: 30 tablet; Refill: 1 - FLUoxetine (PROZAC) 40 MG capsule; Take 1 capsule (40 mg total) by mouth daily.  Dispense: 30 capsule; Refill: 1 - gabapentin (NEURONTIN) 600 MG tablet; Take 2 tablets (1,200 mg total) by mouth 2 (two) times daily.  Dispense: 120 tablet; Refill: 1 - mirtazapine (REMERON) 30 MG tablet; Take 1 tablet (30 mg total) by mouth at bedtime.  Dispense: 30 tablet; Refill: 1 - clonazePAM (KLONOPIN) 0.5 MG tablet; Take 1 tablet (0.5 mg total) by mouth 2 (two) times daily as needed for anxiety.  Dispense: 60 tablet; Refill: 1 - Hydroxyzine 25 mg TID PRN for anxiety.  2. Grief - Continue therapy with Ms. Hatch.   3. PTSD (post-traumatic stress disorder)  - cloNIDine (CATAPRES) 0.2 MG tablet; Take 1 tablet (0.2 mg total) by mouth at bedtime.  Dispense: 30 tablet; Refill: 1 - FLUoxetine (PROZAC) 40 MG capsule; Take 1 capsule (40 mg total) by mouth daily.   Dispense: 30 capsule; Refill: 1 - mirtazapine (REMERON) 30 MG tablet; Take 1 tablet (30 mg total) by mouth at bedtime.  Dispense: 30 tablet; Refill: 1   Continuing same regimen for now.  Continue individual therapy with Ms. Hatch. F/up in 6 weeks.   Zena Amos,  MD 3/10/20221:42 PM

## 2020-12-02 NOTE — ED Provider Notes (Signed)
[facility] EMERGENCY DEPARTMENT Provider Note   CSN: [id number] Arrival date & time: 12/01/20  1706     History Chief Complaint  Patient presents with  . Chest Pain    Margaret Mccann is a 50 y.o. female.  Patient is in the emergency room for couple different reasons.  Soundly patient had a couple episodes of chest discomfort recently.  Patient states that this last time happened earlier today.  She states it happened while she was walking and it felt like a heaviness.  Is got her worried and she started having some nausea, shortness of breath and she says she felt little bit sweaty.  She then checked her blood pressure was elevated which worried her even more.  She states that this is persisted throughout the rest of the day.  She presents here for further evaluation.  She states that she drinks alcohol intermittently but not regularly.  She does not drink today.  She has a past history of cocaine abuse but not in the last month or longer.  She does smoke 5 to 8 cigarettes a day and has a family history of strokes and heart attacks.  States compliance with her medications as listed in the computer aside from Klonopin which she has been out of for a few days.  Her mother is with her states that she has noticed that she is get a rash on her chest that she is worried about as well.  No recent illnesses.  No urinary or GI symptoms other than nausea.  Does not know when taking medicine for blood pressure but is on clonidine for psychiatric reasons.   Chest Pain      Past Medical History:  Diagnosis Date  . Anxiety   . Bipolar 1 disorder (HCC)   . Depression   . Morgellons syndrome     Patient Active Problem List   Diagnosis Date Noted  . Major depressive disorder, recurrent episode with anxious distress (HCC) 08/11/2020  . Intentional drug overdose (HCC) 08/04/2020  . Grief 07/30/2020  . PTSD (post-traumatic stress disorder) 07/30/2020  . Major depressive  disorder, recurrent severe without psychotic features (HCC) 06/04/2018  . Suicide attempt (HCC)   . Seizure (HCC) 06/02/2018  . Cocaine abuse (HCC) 06/03/2012  . 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 APPENDECTOMY       OB History   No obstetric history on file.     Family History  Problem Relation Age of Onset  . Heart attack Father 58  . Stroke Father   . Breast cancer Paternal Aunt   . Ulcerative colitis Neg Hx   . Esophageal cancer Neg Hx     Social History   Tobacco Use  . Smoking status: Former Smoker    Packs/day: 0.50    Types: Cigarettes  . Smokeless tobacco: Never Used  Vaping Use  . Vaping Use: Never used  Substance Use Topics  . Alcohol use: Yes    Comment: occas  . Drug use: Not Currently    Comment: not currently-was +cocaine and amphetamines, clean x3 years    Home Medications Prior to Admission medications   Medication Sig Start Date End Date Taking? Authorizing Provider  acetaminophen (TYLENOL) 500 MG tablet Take 1,000 mg by mouth every 6 (six) hours as needed for moderate pain.    [provider]  clonazePAM Scarlette Calico) 0.5  MG tablet Take 1 tablet (0.5 mg total) by mouth 2 (two) times daily. 10/19/20   Zena AmosKaur, Mandeep, MD  cloNIDine (CATAPRES) 0.2 MG tablet TAKE 1 TABLET BY MOUTH AT BEDTIME 11/30/20   Zena AmosKaur, Mandeep, MD  FLUoxetine (PROZAC) 20 MG capsule Take 3 capsules (60 mg total) by mouth daily. 10/19/20   Zena AmosKaur, Mandeep, MD  gabapentin (NEURONTIN) 600 MG tablet Take 2 tablets (1,200 mg total) by mouth 2 (two) times daily. 10/19/20   Zena AmosKaur, Mandeep, MD  hydrOXYzine (ATARAX/VISTARIL) 25 MG tablet Take 1 tablet (25 mg total) by mouth 3 (three) times daily as needed for anxiety. 10/19/20   Zena AmosKaur, Mandeep, MD  mirtazapine (REMERON) 30 MG tablet Take 1 tablet (30 mg total) by mouth at bedtime. 10/19/20   Zena AmosKaur,  Mandeep, MD    Allergies    Lamictal [lamotrigine], Lamictal [lamotrigine], Tramadol, Sulfamethoxazole-trimethoprim, Erythromycin, Erythromycin base, Geodon [ziprasidone hcl], Levetiracetam, and Sulfa antibiotics  Review of Systems   Review of Systems  Cardiovascular: Positive for chest pain.  All other systems reviewed and are negative.   Physical Exam Updated Vital Signs BP 128/85   Pulse 65   Temp 98.7 F (37.1 C) (Oral)   Resp 13   SpO2 95%   Physical Exam Vitals and nursing note reviewed.  Constitutional:      Appearance: She is well-developed and well-nourished.  HENT:     Head: Normocephalic and atraumatic.     Mouth/Throat:     Mouth: Mucous membranes are moist.     Pharynx: Oropharynx is clear.  Eyes:     Pupils: Pupils are equal, round, and reactive to light.  Cardiovascular:     Rate and Rhythm: Normal rate and regular rhythm.  Pulmonary:     Effort: No respiratory distress.     Breath sounds: No stridor.  Abdominal:     General: Abdomen is flat. There is no distension.  Musculoskeletal:        General: No swelling or tenderness. Normal range of motion.     Cervical back: Normal range of motion.  Skin:    General: Skin is warm and dry.     Coloration: Skin is not jaundiced or pale.  Neurological:     General: No focal deficit present.     Mental Status: She is alert.     ED Results / Procedures / Treatments   Labs (all labs ordered are listed, but only abnormal results are displayed) Labs Reviewed  BASIC METABOLIC PANEL - Abnormal; Notable for the following components:      Result Value   CO2 21 (*)    BUN 5 (*)    All other components within normal limits  CBC - Abnormal; Notable for the following components:   Hemoglobin 15.3 (*)    All other components within normal limits  I-STAT BETA HCG BLOOD, ED ([facility], [facility], [facility] ONLY)  TROPONIN I (HIGH SENSITIVITY)  TROPONIN I (HIGH SENSITIVITY)    EKG EKG Interpretation  Date/Time:  Wednesday December 01 2020 18:03:35 EST Ventricular Rate:  81 PR Interval:  134 QRS Duration: 74 QT Interval:  406 QTC Calculation: 471 R Axis:   -8 Text Interpretation: Normal sinus rhythm Cannot rule out Anterior infarct , age undetermined Abnormal ECG t waves are more inverted than previously in 2021 Confirmed by Marily MemosMesner, Keimya Briddell [phone]([id number]) on 12/01/2020 11:41:24 PM   Radiology DG Chest 2 View  Result Date: 12/01/2020 CLINICAL DATA:  Chest pain and shortness of breath for 1 week EXAM: CHEST -  2 VIEW COMPARISON:  08/14/2020 FINDINGS: The heart size and mediastinal contours are within normal limits. Both lungs are clear. The visualized skeletal structures are unremarkable. IMPRESSION: No active cardiopulmonary disease. Electronically Signed   By: Alcide Clever M.D.   On: 12/01/2020 18:47   CT CHEST ABDOMEN PELVIS W CONTRAST  Result Date: 12/02/2020 CLINICAL DATA:  Chest pain, acute abdominal pain EXAM: CT CHEST, ABDOMEN, AND PELVIS WITH CONTRAST TECHNIQUE: Multidetector CT imaging of the chest, abdomen and pelvis was performed following the standard protocol during bolus administration of intravenous contrast. CONTRAST:  OMNIPAQUE IOHEXOL 350 MG/ML SOLN COMPARISON:  CT chest 08/06/2003 FINDINGS: Due to technical malfunction of the scanner, the study, originally ordered as a CT arteriogram, resulted in relatively poor bolus timing and portal venous phase opacification. Additionally, the inferior pelvis including the bladder and terminal rectum are excluded from view. The examination, however, is still diagnostic for the visualized segment. CT CHEST FINDINGS Cardiovascular: Cardiac size within normal limits. No significant coronary artery calcification. No pericardial effusion. Central pulmonary arteries are of normal caliber. The thoracic aorta is normal. Mediastinum/Nodes: Thyroid unremarkable. No pathologic thoracic adenopathy. Esophagus unremarkable. Moderate hiatal hernia noted. Lungs/Pleura: Minimal subpleural  nodular infiltrate within the right lower lobe, axial image # 37, is nonspecific, but may be infectious or inflammatory in nature. Adjacent pulmonary nodule as well as ground-glass nodule on image # 32 are stable prior examination and are safely considered benign. The lungs are otherwise clear. No pneumothorax or pleural effusion. Musculoskeletal: Osseous structures are age-appropriate. No acute bone abnormality. CT ABDOMEN PELVIS FINDINGS Hepatobiliary: Moderate hepatic steatosis. No enhancing liver lesion. No intra or extrahepatic biliary ductal dilation. Gallbladder unremarkable. Pancreas: Unremarkable Spleen: Unremarkable Adrenals/Urinary Tract: The adrenal glands are normal. The kidneys are normal in size and position. Contrast opacifies the renal collecting systems. No hydronephrosis. No enhancing intrarenal mass. No definite intrarenal calcifications identified. Stomach/Bowel: Appendectomy has been performed. The visualized large and small bowel are unremarkable. No free intraperitoneal gas or significant free fluid. Vascular/Lymphatic: The abdominal vasculature is unremarkable. No pathologic adenopathy within the abdomen and visualized pelvis. Reproductive: Uterus and bilateral adnexa are unremarkable. Other: Tiny broad-based fat containing umbilical hernia. 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 Associated degenerative changes along the concavity. No acute bone abnormality. IMPRESSION: No acute intrathoracic or intra-abdominal pathology identified. Note that the bladder and terminal rectum are not included on this examination. Multiple right lower lobe pulmonary nodules, safely considered benign given their stability over time. Minimal subpleural nodular infiltrate is nonspecific and may be infectious or inflammatory. Moderate hepatic  steatosis. Electronically Signed   By: Helyn Numbers MD   On: 12/02/2020 02:21    Procedures Procedures   Medications Ordered in ED Medications  nitroGLYCERIN (NITROSTAT) SL tablet 0.4 mg (0.4 mg Sublingual Given 12/02/20 0036)  clonazePAM (KLONOPIN) tablet 0.5 mg (0.5 mg Oral Given 12/02/20 0020)  iohexol (OMNIPAQUE) 350 MG/ML injection 100 mL (100 mLs Intravenous Contrast Given 12/02/20 0059)    ED Course  I have reviewed the triage vital signs and the nursing notes.  Pertinent labs & imaging results that were available during my care of the patient were reviewed by me and considered in my medical decision making (see chart for details).    MDM Rules/Calculators/A&P  Suspect she had some type of chest pain (possibly cardiac possibly noncardiac) that caused her to be anxious and estimated blood pressure go up.  She is having difficulty getting over it because she is out of her Klonopin for the last few days.  Given this I still feel like it is pertinent to rule out PE and dissection so do a CT to do that.  We will treat her symptomatically otherwise.  I expect blood pressure to improve as she would get more more negative test results.  If something comes positive then obviously will be addressed as needed. Also notes significant improved with Klonopin.  She feels like this might be related.  Her vital signs all improved as well.  Her erythema improved.  She will keep a blood pressure log at home.  She will get her Klonopin filled tomorrow.  She will return for new or worsening symptoms.  I discussed with her that we can say that her heart was structurally normal just that she does not have a heart attack, blood clot, dissection or other emergent cause for her symptoms at this time.  Answered all of her and her mother's questions.  Patient stable for discharge  Final Clinical Impression(s) / ED Diagnoses Final diagnoses:  Hypertension, unspecified type  Nonspecific  chest pain    Rx / DC Orders ED Discharge Orders    None       Dezmen Alcock, Barbara Cower, MD 12/02/20 0630

## 2020-12-06 ENCOUNTER — Ambulatory Visit (HOSPITAL_COMMUNITY): Payer: No Payment, Other | Admitting: Licensed Clinical Social Worker

## 2020-12-06 ENCOUNTER — Other Ambulatory Visit: Payer: Self-pay

## 2020-12-06 ENCOUNTER — Telehealth (HOSPITAL_COMMUNITY): Payer: Self-pay | Admitting: Licensed Clinical Social Worker

## 2020-12-06 NOTE — Telephone Encounter (Signed)
LCSW sent text link for video session per schedule. When pt failed to sign on LCSW called pt. Call rang multiple times and went to vm. LCSW left detailed vm re purpose of call. Provided info on next appt. Requested call back to advise of status.

## 2020-12-17 ENCOUNTER — Emergency Department (HOSPITAL_COMMUNITY): Payer: Self-pay

## 2020-12-17 ENCOUNTER — Encounter (HOSPITAL_COMMUNITY): Payer: Self-pay | Admitting: Emergency Medicine

## 2020-12-17 ENCOUNTER — Inpatient Hospital Stay (HOSPITAL_COMMUNITY)
Admission: EM | Admit: 2020-12-17 | Discharge: 2020-12-24 | DRG: 896 | Disposition: A | Discharge status: Home / Self Care | Payer: Self-pay | Attending: Family Medicine | Admitting: Family Medicine

## 2020-12-17 ENCOUNTER — Telehealth (HOSPITAL_COMMUNITY): Payer: Self-pay | Admitting: Clinical

## 2020-12-17 ENCOUNTER — Inpatient Hospital Stay (HOSPITAL_COMMUNITY): Payer: Self-pay

## 2020-12-17 DIAGNOSIS — G929 Unspecified toxic encephalopathy: Secondary | ICD-10-CM

## 2020-12-17 DIAGNOSIS — F319 Bipolar disorder, unspecified: Secondary | ICD-10-CM | POA: Diagnosis present

## 2020-12-17 DIAGNOSIS — K760 Fatty (change of) liver, not elsewhere classified: Secondary | ICD-10-CM | POA: Diagnosis present

## 2020-12-17 DIAGNOSIS — G9340 Encephalopathy, unspecified: Secondary | ICD-10-CM | POA: Diagnosis present

## 2020-12-17 DIAGNOSIS — I071 Rheumatic tricuspid insufficiency: Secondary | ICD-10-CM | POA: Clinically undetermined

## 2020-12-17 DIAGNOSIS — F1410 Cocaine abuse, uncomplicated: Secondary | ICD-10-CM | POA: Diagnosis present

## 2020-12-17 DIAGNOSIS — Z9151 Personal history of suicidal behavior: Secondary | ICD-10-CM

## 2020-12-17 DIAGNOSIS — D6489 Other specified anemias: Secondary | ICD-10-CM | POA: Diagnosis present

## 2020-12-17 DIAGNOSIS — F10239 Alcohol dependence with withdrawal, unspecified: Secondary | ICD-10-CM

## 2020-12-17 DIAGNOSIS — Z978 Presence of other specified devices: Secondary | ICD-10-CM

## 2020-12-17 DIAGNOSIS — G928 Other toxic encephalopathy: Secondary | ICD-10-CM | POA: Diagnosis present

## 2020-12-17 DIAGNOSIS — F10931 Alcohol use, unspecified with withdrawal delirium: Secondary | ICD-10-CM

## 2020-12-17 DIAGNOSIS — Z87891 Personal history of nicotine dependence: Secondary | ICD-10-CM

## 2020-12-17 DIAGNOSIS — Z885 Allergy status to narcotic agent status: Secondary | ICD-10-CM

## 2020-12-17 DIAGNOSIS — Z888 Allergy status to other drugs, medicaments and biological substances status: Secondary | ICD-10-CM

## 2020-12-17 DIAGNOSIS — Z781 Physical restraint status: Secondary | ICD-10-CM

## 2020-12-17 DIAGNOSIS — J9602 Acute respiratory failure with hypercapnia: Secondary | ICD-10-CM | POA: Diagnosis present

## 2020-12-17 DIAGNOSIS — J9601 Acute respiratory failure with hypoxia: Secondary | ICD-10-CM | POA: Diagnosis present

## 2020-12-17 DIAGNOSIS — Z20822 Contact with and (suspected) exposure to covid-19: Secondary | ICD-10-CM | POA: Diagnosis present

## 2020-12-17 DIAGNOSIS — F4310 Post-traumatic stress disorder, unspecified: Secondary | ICD-10-CM | POA: Diagnosis present

## 2020-12-17 DIAGNOSIS — J690 Pneumonitis due to inhalation of food and vomit: Secondary | ICD-10-CM | POA: Diagnosis present

## 2020-12-17 DIAGNOSIS — Z881 Allergy status to other antibiotic agents status: Secondary | ICD-10-CM

## 2020-12-17 DIAGNOSIS — E872 Acidosis: Secondary | ICD-10-CM | POA: Diagnosis present

## 2020-12-17 DIAGNOSIS — N179 Acute kidney failure, unspecified: Secondary | ICD-10-CM | POA: Diagnosis present

## 2020-12-17 DIAGNOSIS — Z4659 Encounter for fitting and adjustment of other gastrointestinal appliance and device: Secondary | ICD-10-CM

## 2020-12-17 DIAGNOSIS — R45851 Suicidal ideations: Secondary | ICD-10-CM | POA: Diagnosis present

## 2020-12-17 DIAGNOSIS — I10 Essential (primary) hypertension: Secondary | ICD-10-CM | POA: Diagnosis present

## 2020-12-17 DIAGNOSIS — G9341 Metabolic encephalopathy: Secondary | ICD-10-CM

## 2020-12-17 DIAGNOSIS — F1310 Sedative, hypnotic or anxiolytic abuse, uncomplicated: Secondary | ICD-10-CM | POA: Diagnosis present

## 2020-12-17 DIAGNOSIS — Z818 Family history of other mental and behavioral disorders: Secondary | ICD-10-CM

## 2020-12-17 DIAGNOSIS — R569 Unspecified convulsions: Secondary | ICD-10-CM

## 2020-12-17 DIAGNOSIS — F10231 Alcohol dependence with withdrawal delirium: Principal | ICD-10-CM | POA: Diagnosis present

## 2020-12-17 DIAGNOSIS — Z882 Allergy status to sulfonamides status: Secondary | ICD-10-CM

## 2020-12-17 DIAGNOSIS — E876 Hypokalemia: Secondary | ICD-10-CM | POA: Diagnosis present

## 2020-12-17 DIAGNOSIS — Z814 Family history of other substance abuse and dependence: Secondary | ICD-10-CM

## 2020-12-17 DIAGNOSIS — I361 Nonrheumatic tricuspid (valve) insufficiency: Secondary | ICD-10-CM | POA: Diagnosis present

## 2020-12-17 DIAGNOSIS — Z79899 Other long term (current) drug therapy: Secondary | ICD-10-CM

## 2020-12-17 LAB — URINALYSIS, ROUTINE W REFLEX MICROSCOPIC
Bilirubin Urine: NEGATIVE
Glucose, UA: NEGATIVE mg/dL
Hgb urine dipstick: NEGATIVE
Ketones, ur: NEGATIVE mg/dL
Leukocytes,Ua: NEGATIVE
Nitrite: NEGATIVE
Protein, ur: NEGATIVE mg/dL
Specific Gravity, Urine: 1.01 (ref 1.005–1.030)
pH: 5 (ref 5.0–8.0)

## 2020-12-17 LAB — ETHANOL: Alcohol, Ethyl (B): <10 mg/dL (ref <10)

## 2020-12-17 LAB — COMPREHENSIVE METABOLIC PANEL
ALT: 39 U/L (ref 0–44)
AST: 44 U/L — ABNORMAL HIGH (ref 15–41)
Albumin: 3.4 g/dL — ABNORMAL LOW (ref 3.5–5.0)
Alkaline Phosphatase: 98 U/L (ref 38–126)
Anion gap: 20 — ABNORMAL HIGH (ref 5–15)
BUN: 11 mg/dL (ref 6–20)
CO2: 10 mmol/L — ABNORMAL LOW (ref 22–32)
Calcium: 8.4 mg/dL — ABNORMAL LOW (ref 8.9–10.3)
Chloride: 111 mmol/L (ref 98–111)
Creatinine, Ser: 1.13 mg/dL — ABNORMAL HIGH (ref 0.44–1.00)
GFR, Estimated: >60 mL/min (ref >60)
Glucose, Bld: 198 mg/dL — ABNORMAL HIGH (ref 70–99)
Potassium: 3.4 mmol/L — ABNORMAL LOW (ref 3.5–5.1)
Sodium: 141 mmol/L (ref 135–145)
Total Bilirubin: 0.9 mg/dL (ref 0.3–1.2)
Total Protein: 7 g/dL (ref 6.5–8.1)

## 2020-12-17 LAB — CBC WITH DIFFERENTIAL/PLATELET
Abs Immature Granulocytes: 0.8 10*3/uL — ABNORMAL HIGH (ref 0.00–0.07)
Band Neutrophils: 2 %
Basophils Absolute: 0 10*3/uL (ref 0.0–0.1)
Basophils Relative: 0 %
Eosinophils Absolute: 0 10*3/uL (ref 0.0–0.5)
Eosinophils Relative: 0 %
HCT: 43.2 % (ref 36.0–46.0)
Hemoglobin: 13.8 g/dL (ref 12.0–15.0)
Lymphocytes Relative: 7 %
Lymphs Abs: 1.9 10*3/uL (ref 0.7–4.0)
MCH: 31.7 pg (ref 26.0–34.0)
MCHC: 31.9 g/dL (ref 30.0–36.0)
MCV: 99.1 fL (ref 80.0–100.0)
Metamyelocytes Relative: 2 %
Monocytes Absolute: 0.8 10*3/uL (ref 0.1–1.0)
Monocytes Relative: 3 %
Myelocytes: 1 %
Neutro Abs: 24 10*3/uL — ABNORMAL HIGH (ref 1.7–7.7)
Neutrophils Relative %: 85 %
Platelets: 249 10*3/uL (ref 150–400)
RBC: 4.36 MIL/uL (ref 3.87–5.11)
RDW: 14.7 % (ref 11.5–15.5)
WBC: 27.6 10*3/uL — ABNORMAL HIGH (ref 4.0–10.5)
nRBC: 0 % (ref 0.0–0.2)

## 2020-12-17 LAB — CREATININE, SERUM
Creatinine, Ser: 0.81 mg/dL (ref 0.44–1.00)
GFR, Estimated: >60 mL/min (ref >60)

## 2020-12-17 LAB — PHOSPHORUS: Phosphorus: 2.6 mg/dL (ref 2.5–4.6)

## 2020-12-17 LAB — HCG, QUANTITATIVE, PREGNANCY: hCG, Beta Chain, Quant, S: 1 m[IU]/mL (ref <5)

## 2020-12-17 LAB — CBC
HCT: 33.7 % — ABNORMAL LOW (ref 36.0–46.0)
Hemoglobin: 11.2 g/dL — ABNORMAL LOW (ref 12.0–15.0)
MCH: 31.6 pg (ref 26.0–34.0)
MCHC: 33.2 g/dL (ref 30.0–36.0)
MCV: 95.2 fL (ref 80.0–100.0)
Platelets: 173 10*3/uL (ref 150–400)
RBC: 3.54 MIL/uL — ABNORMAL LOW (ref 3.87–5.11)
RDW: 13.6 % (ref 11.5–15.5)
WBC: 15.7 10*3/uL — ABNORMAL HIGH (ref 4.0–10.5)
nRBC: 0 % (ref 0.0–0.2)

## 2020-12-17 LAB — CBG MONITORING, ED: Glucose-Capillary: 101 mg/dL — ABNORMAL HIGH (ref 70–99)

## 2020-12-17 LAB — I-STAT BETA HCG BLOOD, ED (MC, WL, AP ONLY): I-stat hCG, quantitative: 66 m[IU]/mL — ABNORMAL HIGH (ref <5)

## 2020-12-17 LAB — GLUCOSE, CAPILLARY: Glucose-Capillary: 104 mg/dL — ABNORMAL HIGH (ref 70–99)

## 2020-12-17 LAB — MRSA PCR SCREENING: MRSA by PCR: NEGATIVE

## 2020-12-17 LAB — RAPID URINE DRUG SCREEN, HOSP PERFORMED
Amphetamines: NOT DETECTED
Barbiturates: NOT DETECTED
Benzodiazepines: POSITIVE — AB
Cocaine: POSITIVE — AB
Opiates: NOT DETECTED
Tetrahydrocannabinol: NOT DETECTED

## 2020-12-17 LAB — MAGNESIUM: Magnesium: 2.1 mg/dL (ref 1.7–2.4)

## 2020-12-17 LAB — RESP PANEL BY RT-PCR (FLU A&B, COVID) ARPGX2
Influenza A by PCR: NEGATIVE
Influenza B by PCR: NEGATIVE
SARS Coronavirus 2 by RT PCR: NEGATIVE

## 2020-12-17 MED ORDER — ENOXAPARIN SODIUM 40 MG/0.4ML ~~LOC~~ SOLN
40.0000 mg | SUBCUTANEOUS | Status: DC
  Administered 2020-12-17 – 2020-12-23 (×7): 40 mg via SUBCUTANEOUS
  Filled 2020-12-17 (×7): qty 0.4

## 2020-12-17 MED ORDER — ONDANSETRON HCL 4 MG/2ML IJ SOLN
4.0000 mg | Freq: Once | INTRAMUSCULAR | Status: AC
  Administered 2020-12-17: 4 mg via INTRAVENOUS
  Filled 2020-12-17: qty 2

## 2020-12-17 MED ORDER — POLYETHYLENE GLYCOL 3350 17 G PO PACK: 17.0000 g | PACK | Freq: Every day | ORAL | Status: DC | PRN

## 2020-12-17 MED ORDER — PROPOFOL 1000 MG/100ML IV EMUL
0.0000 ug/kg/min | INTRAVENOUS | Status: DC
  Administered 2020-12-17: 50 ug/kg/min via INTRAVENOUS
  Administered 2020-12-18: 30 ug/kg/min via INTRAVENOUS
  Administered 2020-12-18: 20 ug/kg/min via INTRAVENOUS
  Filled 2020-12-17 (×2): qty 100

## 2020-12-17 MED ORDER — THIAMINE HCL 100 MG PO TABS
100.0000 mg | ORAL_TABLET | Freq: Every day | ORAL | Status: DC
Start: 2020-12-17 — End: 2020-12-18
  Administered 2020-12-18: 100 mg via ORAL
  Filled 2020-12-17: qty 1

## 2020-12-17 MED ORDER — LORAZEPAM 1 MG PO TABS: 0.0000 mg | ORAL_TABLET | Freq: Two times a day (BID) | ORAL | Status: DC

## 2020-12-17 MED ORDER — SODIUM CHLORIDE 0.9 % IV SOLN
3.0000 g | Freq: Four times a day (QID) | INTRAVENOUS | Status: DC
  Administered 2020-12-18 – 2020-12-21 (×14): 3 g via INTRAVENOUS
  Filled 2020-12-17 (×2): qty 8
  Filled 2020-12-17: qty 3
  Filled 2020-12-17 (×2): qty 8
  Filled 2020-12-17 (×3): qty 3
  Filled 2020-12-17 (×8): qty 8

## 2020-12-17 MED ORDER — THIAMINE HCL 100 MG/ML IJ SOLN
100.0000 mg | Freq: Every day | INTRAMUSCULAR | Status: DC
  Administered 2020-12-17: 100 mg via INTRAVENOUS
  Filled 2020-12-17: qty 2

## 2020-12-17 MED ORDER — CHLORHEXIDINE GLUCONATE CLOTH 2 % EX PADS
6.0000 | MEDICATED_PAD | Freq: Every day | CUTANEOUS | Status: DC
  Administered 2020-12-17 – 2020-12-23 (×7): 6 via TOPICAL

## 2020-12-17 MED ORDER — INSULIN ASPART 100 UNIT/ML ~~LOC~~ SOLN: 0.0000 [IU] | SUBCUTANEOUS | Status: DC

## 2020-12-17 MED ORDER — SODIUM CHLORIDE 0.9 % IV SOLN
3.0000 g | Freq: Once | INTRAVENOUS | Status: AC
  Administered 2020-12-17: 3 g via INTRAVENOUS
  Filled 2020-12-17: qty 8

## 2020-12-17 MED ORDER — LACTATED RINGERS IV SOLN: INTRAVENOUS | Status: DC

## 2020-12-17 MED ORDER — CHLORHEXIDINE GLUCONATE 0.12% ORAL RINSE (MEDLINE KIT)
15.0000 mL | Freq: Two times a day (BID) | OROMUCOSAL | Status: DC
  Administered 2020-12-17 – 2020-12-22 (×9): 15 mL via OROMUCOSAL

## 2020-12-17 MED ORDER — ADULT MULTIVITAMIN LIQUID CH
15.0000 mL | Freq: Every day | ORAL | Status: DC
  Administered 2020-12-18 – 2020-12-19 (×2): 15 mL
  Filled 2020-12-17 (×2): qty 15

## 2020-12-17 MED ORDER — ORAL CARE MOUTH RINSE
15.0000 mL | OROMUCOSAL | Status: DC
  Administered 2020-12-17 – 2020-12-20 (×21): 15 mL via OROMUCOSAL

## 2020-12-17 MED ORDER — LORAZEPAM 1 MG PO TABS: 0.0000 mg | ORAL_TABLET | Freq: Four times a day (QID) | ORAL | Status: DC

## 2020-12-17 MED ORDER — ONDANSETRON HCL 4 MG/2ML IJ SOLN
4.0000 mg | Freq: Once | INTRAMUSCULAR | Status: AC
Start: 2020-12-17 — End: 2020-12-17
  Administered 2020-12-17: 4 mg via INTRAVENOUS
  Filled 2020-12-17: qty 2

## 2020-12-17 MED ORDER — DOCUSATE SODIUM 50 MG/5ML PO LIQD: 100.0000 mg | Freq: Two times a day (BID) | ORAL | Status: DC | PRN

## 2020-12-17 MED ORDER — SUCCINYLCHOLINE CHLORIDE 200 MG/10ML IV SOSY
PREFILLED_SYRINGE | INTRAVENOUS | Status: AC
  Filled 2020-12-17: qty 10

## 2020-12-17 MED ORDER — LORAZEPAM 2 MG/ML IJ SOLN
0.0000 mg | Freq: Four times a day (QID) | INTRAMUSCULAR | Status: DC
  Administered 2020-12-17: 2 mg via INTRAVENOUS
  Filled 2020-12-17 (×3): qty 1
  Filled 2020-12-17: qty 2
  Filled 2020-12-17: qty 1

## 2020-12-17 MED ORDER — LORAZEPAM 2 MG/ML IJ SOLN
INTRAMUSCULAR | Status: AC
  Filled 2020-12-17: qty 1

## 2020-12-17 MED ORDER — FENTANYL 2500MCG IN NS 250ML (10MCG/ML) PREMIX INFUSION
25.0000 ug/h | INTRAVENOUS | Status: DC
  Administered 2020-12-17: 50 ug/h via INTRAVENOUS
  Administered 2020-12-18: 200 ug/h via INTRAVENOUS
  Administered 2020-12-18 (×2): 350 ug/h via INTRAVENOUS
  Administered 2020-12-19: 250 ug/h via INTRAVENOUS
  Filled 2020-12-17 (×5): qty 250

## 2020-12-17 MED ORDER — SODIUM CHLORIDE 0.9 % IV BOLUS
1000.0000 mL | Freq: Once | INTRAVENOUS | Status: AC
  Administered 2020-12-17: 1000 mL via INTRAVENOUS

## 2020-12-17 MED ORDER — LORAZEPAM 2 MG/ML IJ SOLN
2.0000 mg | INTRAMUSCULAR | Status: DC | PRN
  Administered 2020-12-17 – 2020-12-18 (×2): 2 mg via INTRAVENOUS
  Filled 2020-12-17 (×2): qty 1

## 2020-12-17 MED ORDER — PROPOFOL 10 MG/ML IV BOLUS
100.0000 mg | Freq: Once | INTRAVENOUS | Status: AC
  Administered 2020-12-17: 100 mg via INTRAVENOUS

## 2020-12-17 MED ORDER — PROSOURCE TF PO LIQD
45.0000 mL | Freq: Two times a day (BID) | ORAL | Status: DC
  Administered 2020-12-18: 45 mL
  Filled 2020-12-17: qty 45

## 2020-12-17 MED ORDER — LORAZEPAM 2 MG/ML IJ SOLN
4.0000 mg | Freq: Once | INTRAMUSCULAR | Status: AC
  Administered 2020-12-17: 4 mg via INTRAVENOUS

## 2020-12-17 MED ORDER — ETOMIDATE 2 MG/ML IV SOLN
INTRAVENOUS | Status: AC
  Filled 2020-12-17: qty 20

## 2020-12-17 MED ORDER — PROPOFOL 1000 MG/100ML IV EMUL
INTRAVENOUS | Status: AC
  Filled 2020-12-17: qty 100

## 2020-12-17 MED ORDER — VITAL HIGH PROTEIN PO LIQD: 1000.0000 mL | ORAL | Status: DC

## 2020-12-17 MED ORDER — DEXTROSE IN LACTATED RINGERS 5 % IV SOLN: INTRAVENOUS | Status: DC

## 2020-12-17 MED ORDER — LORAZEPAM 2 MG/ML IJ SOLN: 0.0000 mg | Freq: Two times a day (BID) | INTRAMUSCULAR | Status: DC

## 2020-12-17 MED ORDER — PROPOFOL 1000 MG/100ML IV EMUL
INTRAVENOUS | Status: AC
  Administered 2020-12-17: 5 ug/kg/min via INTRAVENOUS
  Filled 2020-12-17: qty 100

## 2020-12-17 MED ORDER — FENTANYL BOLUS VIA INFUSION
50.0000 ug | INTRAVENOUS | Status: DC | PRN
Start: 2020-12-17 — End: 2020-12-19
  Administered 2020-12-17 – 2020-12-18 (×7): 50 ug via INTRAVENOUS
  Filled 2020-12-17: qty 50

## 2020-12-17 MED ORDER — PROPOFOL 1000 MG/100ML IV EMUL: 0.0000 ug/kg/min | INTRAVENOUS | Status: DC

## 2020-12-17 MED ORDER — FENTANYL CITRATE (PF) 100 MCG/2ML IJ SOLN
50.0000 ug | Freq: Once | INTRAMUSCULAR | Status: AC
  Administered 2020-12-17: 50 ug via INTRAVENOUS
  Filled 2020-12-17: qty 2

## 2020-12-17 MED ORDER — FAMOTIDINE IN NACL 20-0.9 MG/50ML-% IV SOLN
20.0000 mg | Freq: Two times a day (BID) | INTRAVENOUS | Status: DC
  Administered 2020-12-17 – 2020-12-21 (×9): 20 mg via INTRAVENOUS
  Filled 2020-12-17 (×11): qty 50

## 2020-12-17 NOTE — Progress Notes (Signed)
Chaplain met patient's mother in hallway outside her room.  The staff was busy with patient.  Mother distraught as her daughter has had problems many times and it is a difficult problem.  She is feeling stress from her husband also dealing with daughter. ([place])  Chaplain listened and sougth to bring comfort to patient's mother and encouragement that mother has done so much but the daughter has to make decisions and do some things herself. Conard Novak, Chaplain   12/17/20 1900  Clinical Encounter Type  Visited With Family  Visit Type Spiritual support  Referral From Chaplain  Consult/Referral To Chaplain  Spiritual Encounters  Spiritual Needs Prayer;Emotional  Stress Factors  Family Stress Factors Exhausted  Conard Novak, [place]

## 2020-12-17 NOTE — Progress Notes (Signed)
eLink Physician-Brief Progress Note Patient Name: Margaret Mccann DOB: 02-Sep-1971 MRN: [id number]   Date of Service  12/17/2020  HPI/Events of Note  Patient admitted with altered mental status following a witnessed seizure en-route to the hospital, patient is a known alcoholic with past admissions and her seizure is presumed to be secondary to ETOH withdrawal. Patient vomited and aspirated, and was intubated for acute hypoxic respiratory failure.  eICU Interventions  New Patient Evaluation completed.        Migdalia Dk 12/17/2020, 9:29 PM

## 2020-12-17 NOTE — ED Provider Notes (Incomplete)
[facility]-EMERGENCY DEPT Provider Note   CSN: [id number] Arrival date & time: 12/17/20  1529     History Chief Complaint  Patient presents with  . Altered Mental Status  . Seizures    Margaret Mccann is a 50 y.o. female.  Margaret Mccann is a 50 y.o. female with a history of polysubstance abuse, seizures, suicide attempt, bipolar disorder, who presents to the emergency department via EMS for evaluation of altered mental status and seizure.  Patient is somnolent, unable to provide any history.  The majority of the history is provided by EMS and patient's mother.  Per the mother who is now at bedside, patient was dropped off at her home around 4 AM, at this time she was alert and talking but clearly intoxicated, she is unsure what substances used exactly, but assumes alcohol and also has a history of cocaine and meth use.  She has been living with a friend recently and has been using substances regularly and had not been staying with the mother until she was dropped off early this morning.  She stayed up until 9 AM checking on her frequently and she seemed to be alert and able to answer questions, she was able to drink some water.  The patient's mother fell asleep but when she woke up around 11-12 she found that the patient had vomited numerous times and seemed to be more confused and sleepy than before.  Her speech was not making much sense.  She then decided to call EMS as patient seem to be getting worse rather than improving.  Per EMS patient had pill bottles for Klonopin and clonidine both had more pills missing than would be expected based on prescription instructions, also had prescriptions for gabapentin and Remeron.  Per EMS patient had a 20-30 second long seizure that was self resolving and no meds were given.  Initially upon arrival after seizure was postictal with some agitation and combativeness and then became more somnolent again.  Patient  unable to provide any history as to what substances she uses.  Mother does report prior history of seizures intermittently that were thought primarily to be in the setting of substance abuse, was briefly on seizure medication under the care of [facility] neurology but she is unsure what medication.  Gets all of her medications currently from [facility] behavioral health.        Past Medical History:  Diagnosis Date  . Anxiety   . Bipolar 1 disorder (HCC)   . Depression   . Morgellons syndrome     Patient Active Problem List   Diagnosis Date Noted  . Major depressive disorder, recurrent episode with anxious distress (HCC) 08/11/2020  . Intentional drug overdose (HCC) 08/04/2020  . Grief 07/30/2020  . PTSD (post-traumatic stress disorder) 07/30/2020  . Major depressive disorder, recurrent severe without psychotic features (HCC) 06/04/2018  . Suicide attempt (HCC)   . Seizure (HCC) 06/02/2018  . Cocaine abuse (HCC) 06/03/2012  . 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 APPENDECTOMY       OB History   No obstetric history on file.     Family History  Problem Relation Age of Onset  . Heart attack Father 45  . Stroke Father   . Breast cancer Paternal Aunt   . Ulcerative colitis Neg Hx   . Esophageal cancer Neg Hx  Social History   Tobacco Use  . Smoking status: Former Smoker    Packs/day: 0.50    Types: Cigarettes  . Smokeless tobacco: Never Used  Vaping Use  . Vaping Use: Never used  Substance Use Topics  . Alcohol use: Yes    Comment: occas  . Drug use: Not Currently    Comment: not currently-was +cocaine and amphetamines, clean x3 years    Home Medications Prior to Admission medications   Medication Sig Start Date End Date Taking? Authorizing Provider  acetaminophen (TYLENOL) 500 MG tablet Take 1,000 mg by mouth every 6  (six) hours as needed for moderate pain.    [provider]  clonazePAM (KLONOPIN) 0.5 MG tablet Take 1 tablet (0.5 mg total) by mouth 2 (two) times daily. 12/02/20   Zena Amos, MD  cloNIDine (CATAPRES) 0.2 MG tablet Take 1 tablet (0.2 mg total) by mouth at bedtime. 12/02/20   Zena Amos, MD  FLUoxetine (PROZAC) 20 MG capsule Take 3 capsules (60 mg total) by mouth daily. 12/02/20   Zena Amos, MD  gabapentin (NEURONTIN) 600 MG tablet Take 2 tablets (1,200 mg total) by mouth 2 (two) times daily. 12/02/20   Zena Amos, MD  hydrOXYzine (ATARAX/VISTARIL) 25 MG tablet Take 1 tablet (25 mg total) by mouth 3 (three) times daily as needed for anxiety. 12/02/20   Zena Amos, MD  mirtazapine (REMERON) 30 MG tablet Take 1 tablet (30 mg total) by mouth at bedtime. 12/02/20   Zena Amos, MD    Allergies    Lamictal [lamotrigine], Lamictal [lamotrigine], Tramadol, Sulfamethoxazole-trimethoprim, Erythromycin, Erythromycin base, Geodon [ziprasidone hcl], Levetiracetam, and Sulfa antibiotics  Review of Systems   Review of Systems  Unable to perform ROS: Mental status change    Physical Exam Updated Vital Signs BP (!) 157/80   Pulse (!) 114   Temp 97.9 F (36.6 C) (Oral)   Resp (!) 31   Ht 5\' 3"  (1.6 m)   SpO2 99%   BMI 27.46 kg/m   Physical Exam Constitutional:      Appearance: She is ill-appearing.     Comments: Patient is somnolent awakens to sternal rub or loud voice and then quickly goes back to sleep, unable to follow commands, ill-appearing  HENT:     Head: Normocephalic and atraumatic.     Mouth/Throat:     Mouth: Mucous membranes are dry.     Comments: Small tongue bite injury, no larger laceration requiring repair Eyes:     Comments: Pupils are dilated bilaterally but reactive to light  Cardiovascular:     Rate and Rhythm: Regular rhythm. Tachycardia present.     Pulses: Normal pulses.     Heart sounds: Normal heart sounds.  Pulmonary:     Effort: Tachypnea  present. No respiratory distress.     Breath sounds: Rhonchi and rales present.     Comments: Patient is mildly tachypneic, on 2 L nasal cannula satting in the mid 90s, lungs with coarse breath sounds bilaterally Abdominal:     General: Bowel sounds are normal. There is no distension.     Palpations: Abdomen is soft. There is no mass.     Tenderness: There is no abdominal tenderness. There is no guarding.     Comments: Abdomen is soft, nondistended, bowel sounds present throughout, no focal tenderness or guarding noted in all quadrants, no peritoneal signs.  Musculoskeletal:        General: No deformity.     Cervical back: Neck supple.  Skin:  General: Skin is warm and dry.  Neurological:     Mental Status: She is lethargic.     Comments: Patient is somnolent, will arouse somewhat to loud voice or sternal rub but will not answer questions or follow commands, does seem to be independently moving all extremities, no obvious facial droop or unilateral weakness.  Appears post ictal after seizure PTA, nursing staff report patient was initially somewhat agitated and combative.  Psychiatric:        Mood and Affect: Mood normal.        Behavior: Behavior normal.     ED Results / Procedures / Treatments   Labs (all labs ordered are listed, but only abnormal results are displayed) Labs Reviewed  CBC WITH DIFFERENTIAL/PLATELET - Abnormal; Notable for the following components:      Result Value   WBC 27.6 (*)    Neutro Abs 24.0 (*)    Abs Immature Granulocytes 0.80 (*)    All other components within normal limits  I-STAT BETA HCG BLOOD, ED ([facility], [facility], [facility] ONLY) - Abnormal; Notable for the following components:   I-stat hCG, quantitative 66.0 (*)    All other components within normal limits  RESP PANEL BY RT-PCR (FLU A&B, COVID) ARPGX2  CULTURE, BLOOD (ROUTINE X 2)  CULTURE, BLOOD (ROUTINE X 2)  ETHANOL  HCG, QUANTITATIVE, PREGNANCY  URINALYSIS, ROUTINE W REFLEX MICROSCOPIC  RAPID URINE  DRUG SCREEN, HOSP PERFORMED  COMPREHENSIVE METABOLIC PANEL  BLOOD GAS, ARTERIAL    EKG EKG Interpretation  Date/Time:  Friday December 17 2020 15:55:55 EDT Ventricular Rate:  117 PR Interval:    QRS Duration: 95 QT Interval:  323 QTC Calculation: 451 R Axis:   -8 Text Interpretation: Sinus tachycardia Ventricular premature complex Aberrant complex Borderline T abnormalities, anterior leads Confirmed by Ross Marcus ([id number]) on 12/17/2020 3:59:23 PM   Radiology CT Head Wo Contrast  Result Date: 12/17/2020 CLINICAL DATA:  Altered mental status.  Possible overdose. EXAM: CT HEAD WITHOUT CONTRAST TECHNIQUE: Contiguous axial images were obtained from the base of the skull through the vertex without intravenous contrast. COMPARISON:  Jan 29, 2020. FINDINGS: Brain: No evidence of acute infarction, hemorrhage, hydrocephalus, extra-axial collection or mass lesion/mass effect. Vascular: No hyperdense vessel or unexpected calcification. Skull: Normal. Negative for fracture or focal lesion. Sinuses/Orbits: No acute finding. Other: None. IMPRESSION: Normal head CT. Electronically Signed   By: Lupita Raider M.D.   On: 12/17/2020 16:53   DG Chest Port 1 View  Result Date: 12/17/2020 CLINICAL DATA:  Post seizure.  Possible aspiration. EXAM: PORTABLE CHEST 1 VIEW COMPARISON:  Chest radiograph 12/01/2020, CT 12/02/2020 FINDINGS: Airspace consolidation throughout the right upper lobe, lesser patchy opacity at the right lung base. There is also likely retrocardiac opacity. Heart is normal in size with unchanged mediastinal contours. The patient is rotated. No pneumothorax or significant pleural effusion. No acute osseous abnormalities are seen. IMPRESSION: Multifocal airspace consolidation, right greater than left, most consistent with multifocal pneumonia. Given provided history, aspiration is suspected. Electronically Signed   By: Narda Rutherford M.D.   On: 12/17/2020 17:09    Procedures .Critical  Care Performed by: Dartha Lodge, PA-C Authorized by: Dartha Lodge, PA-C   Critical care provider statement:    Critical care time (minutes):  45   Critical care time was exclusive of:  Separately billable procedures and treating other patients   Critical care was necessary to treat or prevent imminent or life-threatening deterioration of the following conditions:  CNS failure  or compromise and respiratory failure (Alcohol withdrawal seizures, DTs, aspiration pneumonia, requiring intubation for airway protection)   Critical care was time spent personally by me on the following activities:  Discussions with consultants, evaluation of patient's response to treatment, examination of patient, ordering and performing treatments and interventions, ordering and review of laboratory studies, ordering and review of radiographic studies, pulse oximetry, re-evaluation of patient's condition, obtaining history from patient or surrogate and review of old charts   Care discussed with: admitting provider     {Remember to document critical care time when appropriate:1}  Medications Ordered in ED Medications  LORazepam (ATIVAN) injection 0-4 mg (2 mg Intravenous Given 12/17/20 1713)    Or  LORazepam (ATIVAN) tablet 0-4 mg ( Oral See Alternative 12/17/20 1713)  LORazepam (ATIVAN) injection 0-4 mg (has no administration in time range)    Or  LORazepam (ATIVAN) tablet 0-4 mg (has no administration in time range)  thiamine tablet 100 mg ( Oral See Alternative 12/17/20 1714)    Or  thiamine (B-1) injection 100 mg (100 mg Intravenous Given 12/17/20 1714)  Ampicillin-Sulbactam (UNASYN) 3 g in sodium chloride 0.9 % 100 mL IVPB (3 g Intravenous New Bag/Given (Non-Interop) 12/17/20 1852)  succinylcholine (ANECTINE) 200 MG/10ML syringe (has no administration in time range)  etomidate (AMIDATE) 2 MG/ML injection (has no administration in time range)  propofol (DIPRIVAN) 1000 MG/100ML infusion (has no administration in  time range)  propofol (DIPRIVAN) 1000 MG/100ML infusion (has no administration in time range)  sodium chloride 0.9 % bolus 1,000 mL (0 mLs Intravenous Stopped 12/17/20 1751)  ondansetron (ZOFRAN) injection 4 mg (4 mg Intravenous Given 12/17/20 1608)  LORazepam (ATIVAN) 2 MG/ML injection (  Given 12/17/20 1633)  LORazepam (ATIVAN) injection 4 mg (4 mg Intravenous Given 12/17/20 1811)  propofol (DIPRIVAN) 10 mg/mL bolus/IV push 100 mg (100 mg Intravenous Given 12/17/20 1850)  LORazepam (ATIVAN) injection 4 mg (4 mg Intravenous Given 12/17/20 1850)    ED Course  I have reviewed the triage vital signs and the nursing notes.  Pertinent labs & imaging results that were available during my care of the patient were reviewed by me and considered in my medical decision making (see chart for details).    MDM Rules/Calculators/A&P                          *** Final Clinical Impression(s) / ED Diagnoses Final diagnoses:  Acute respiratory failure with hypoxia (HCC)  Alcohol withdrawal seizure with delirium (HCC)    Rx / DC Orders ED Discharge Orders    None

## 2020-12-17 NOTE — ED Provider Notes (Signed)
[facility]-EMERGENCY DEPT Provider Note   CSN: [id number] Arrival date & time: 12/17/20  1529     History Chief Complaint  Patient presents with  . Altered Mental Status  . Seizures    Coraima Tibbs Pullman is a 50 y.o. female.  Keyleigh Manninen Friddle Derrill Memo is a 50 y.o. female with a history of polysubstance abuse, seizures, suicide attempt, bipolar disorder, who presents to the emergency department via EMS for evaluation of altered mental status and seizure.  Patient is somnolent, unable to provide any history.  The majority of the history is provided by EMS and patient's mother.  Per the mother who is now at bedside, patient was dropped off at her home around 4 AM, at this time she was alert and talking but clearly intoxicated, she is unsure what substances used exactly, but assumes alcohol and also has a history of cocaine and meth use.  She has been living with a friend recently and has been using substances regularly and had not been staying with the mother until she was dropped off early this morning.  She stayed up until 9 AM checking on her frequently and she seemed to be alert and able to answer questions, she was able to drink some water.  The patient's mother fell asleep but when she woke up around 11-12 she found that the patient had vomited numerous times and seemed to be more confused and sleepy than before.  Her speech was not making much sense.  She then decided to call EMS as patient seem to be getting worse rather than improving.  Per EMS patient had pill bottles for Klonopin and clonidine both had more pills missing than would be expected based on prescription instructions, also had prescriptions for gabapentin and Remeron.  Per EMS patient had a 20-30 second long seizure that was self resolving and no meds were given.  Initially upon arrival after seizure was postictal with some agitation and combativeness and then became more somnolent again.  Patient  unable to provide any history as to what substances she uses.  Mother does report prior history of seizures intermittently that were thought primarily to be in the setting of substance abuse, was briefly on seizure medication under the care of low [facility] neurology but she is unsure what medication.  Gets all of her medications currently from [facility] behavioral health.        Past Medical History:  Diagnosis Date  . Anxiety   . Bipolar 1 disorder (HCC)   . Depression   . Morgellons syndrome     Patient Active Problem List   Diagnosis Date Noted  . Major depressive disorder, recurrent episode with anxious distress (HCC) 08/11/2020  . Intentional drug overdose (HCC) 08/04/2020  . Grief 07/30/2020  . PTSD (post-traumatic stress disorder) 07/30/2020  . Major depressive disorder, recurrent severe without psychotic features (HCC) 06/04/2018  . Suicide attempt (HCC)   . Seizure (HCC) 06/02/2018  . Cocaine abuse (HCC) 06/03/2012  . 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 APPENDECTOMY       OB History   No obstetric history on file.     Family History  Problem Relation Age of Onset  . Heart attack Father 42  . Stroke Father   . Breast cancer Paternal Aunt   . Ulcerative colitis Neg Hx   . Esophageal cancer Neg Hx  Social History   Tobacco Use  . Smoking status: Former Smoker    Packs/day: 0.50    Types: Cigarettes  . Smokeless tobacco: Never Used  Vaping Use  . Vaping Use: Never used  Substance Use Topics  . Alcohol use: Yes    Comment: occas  . Drug use: Not Currently    Comment: not currently-was +cocaine and amphetamines, clean x3 years    Home Medications Prior to Admission medications   Medication Sig Start Date End Date Taking? Authorizing Provider  acetaminophen (TYLENOL) 500 MG tablet Take 1,000 mg by mouth every 6  (six) hours as needed for moderate pain.    [provider]  clonazePAM (KLONOPIN) 0.5 MG tablet Take 1 tablet (0.5 mg total) by mouth 2 (two) times daily. 12/02/20   Zena Amos, MD  cloNIDine (CATAPRES) 0.2 MG tablet Take 1 tablet (0.2 mg total) by mouth at bedtime. 12/02/20   Zena Amos, MD  FLUoxetine (PROZAC) 20 MG capsule Take 3 capsules (60 mg total) by mouth daily. 12/02/20   Zena Amos, MD  gabapentin (NEURONTIN) 600 MG tablet Take 2 tablets (1,200 mg total) by mouth 2 (two) times daily. 12/02/20   Zena Amos, MD  hydrOXYzine (ATARAX/VISTARIL) 25 MG tablet Take 1 tablet (25 mg total) by mouth 3 (three) times daily as needed for anxiety. 12/02/20   Zena Amos, MD  mirtazapine (REMERON) 30 MG tablet Take 1 tablet (30 mg total) by mouth at bedtime. 12/02/20   Zena Amos, MD    Allergies    Lamictal [lamotrigine], Lamictal [lamotrigine], Tramadol, Sulfamethoxazole-trimethoprim, Erythromycin, Erythromycin base, Geodon [ziprasidone hcl], Levetiracetam, and Sulfa antibiotics  Review of Systems   Review of Systems  Unable to perform ROS: Mental status change    Physical Exam Updated Vital Signs BP (!) 157/80   Pulse (!) 114   Temp 97.9 F (36.6 C) (Oral)   Resp (!) 31   Ht 5\' 3"  (1.6 m)   SpO2 99%   BMI 27.46 kg/m   Physical Exam Constitutional:      Appearance: She is ill-appearing.     Comments: Patient is somnolent awakens to sternal rub or loud voice and then quickly goes back to sleep, unable to follow commands, ill-appearing  HENT:     Head: Normocephalic and atraumatic.     Mouth/Throat:     Mouth: Mucous membranes are dry.     Comments: Small tongue bite injury, no larger laceration requiring repair Eyes:     Comments: Pupils are dilated bilaterally but reactive to light  Cardiovascular:     Rate and Rhythm: Regular rhythm. Tachycardia present.     Pulses: Normal pulses.     Heart sounds: Normal heart sounds.  Pulmonary:     Effort: Tachypnea  present. No respiratory distress.     Breath sounds: Rhonchi and rales present.     Comments: Patient is mildly tachypneic, on 2 L nasal cannula satting in the mid 90s, lungs with coarse breath sounds bilaterally Abdominal:     General: Bowel sounds are normal. There is no distension.     Palpations: Abdomen is soft. There is no mass.     Tenderness: There is no abdominal tenderness. There is no guarding.     Comments: Abdomen is soft, nondistended, bowel sounds present throughout, no focal tenderness or guarding noted in all quadrants, no peritoneal signs.  Musculoskeletal:        General: No deformity.     Cervical back: Neck supple.  Skin:  General: Skin is warm and dry.  Neurological:     Mental Status: She is lethargic.     Comments: Patient is somnolent, will arouse somewhat to loud voice or sternal rub but will not answer questions or follow commands, does seem to be independently moving all extremities, no obvious facial droop or unilateral weakness.  Appears post ictal after seizure PTA, nursing staff report patient was initially somewhat agitated and combative.  Psychiatric:        Mood and Affect: Mood normal.        Behavior: Behavior normal.     ED Results / Procedures / Treatments   Labs (all labs ordered are listed, but only abnormal results are displayed) Labs Reviewed  CBC WITH DIFFERENTIAL/PLATELET - Abnormal; Notable for the following components:      Result Value   WBC 27.6 (*)    Neutro Abs 24.0 (*)    Abs Immature Granulocytes 0.80 (*)    All other components within normal limits  I-STAT BETA HCG BLOOD, ED ([facility], [facility], [facility] ONLY) - Abnormal; Notable for the following components:   I-stat hCG, quantitative 66.0 (*)    All other components within normal limits  RESP PANEL BY RT-PCR (FLU A&B, COVID) ARPGX2  CULTURE, BLOOD (ROUTINE X 2)  CULTURE, BLOOD (ROUTINE X 2)  ETHANOL  HCG, QUANTITATIVE, PREGNANCY  URINALYSIS, ROUTINE W REFLEX MICROSCOPIC  RAPID URINE  DRUG SCREEN, HOSP PERFORMED  COMPREHENSIVE METABOLIC PANEL  BLOOD GAS, ARTERIAL    EKG EKG Interpretation  Date/Time:  Friday December 17 2020 15:55:55 EDT Ventricular Rate:  117 PR Interval:    QRS Duration: 95 QT Interval:  323 QTC Calculation: 451 R Axis:   -8 Text Interpretation: Sinus tachycardia Ventricular premature complex Aberrant complex Borderline T abnormalities, anterior leads Confirmed by Ross Marcus ([id number]) on 12/17/2020 3:59:23 PM   Radiology CT Head Wo Contrast  Result Date: 12/17/2020 CLINICAL DATA:  Altered mental status.  Possible overdose. EXAM: CT HEAD WITHOUT CONTRAST TECHNIQUE: Contiguous axial images were obtained from the base of the skull through the vertex without intravenous contrast. COMPARISON:  Jan 29, 2020. FINDINGS: Brain: No evidence of acute infarction, hemorrhage, hydrocephalus, extra-axial collection or mass lesion/mass effect. Vascular: No hyperdense vessel or unexpected calcification. Skull: Normal. Negative for fracture or focal lesion. Sinuses/Orbits: No acute finding. Other: None. IMPRESSION: Normal head CT. Electronically Signed   By: Lupita Raider M.D.   On: 12/17/2020 16:53   DG Chest Port 1 View  Result Date: 12/17/2020 CLINICAL DATA:  Post seizure.  Possible aspiration. EXAM: PORTABLE CHEST 1 VIEW COMPARISON:  Chest radiograph 12/01/2020, CT 12/02/2020 FINDINGS: Airspace consolidation throughout the right upper lobe, lesser patchy opacity at the right lung base. There is also likely retrocardiac opacity. Heart is normal in size with unchanged mediastinal contours. The patient is rotated. No pneumothorax or significant pleural effusion. No acute osseous abnormalities are seen. IMPRESSION: Multifocal airspace consolidation, right greater than left, most consistent with multifocal pneumonia. Given provided history, aspiration is suspected. Electronically Signed   By: Narda Rutherford M.D.   On: 12/17/2020 17:09    Procedures .Critical  Care Performed by: Dartha Lodge, PA-C Authorized by: Dartha Lodge, PA-C   Critical care provider statement:    Critical care time (minutes):  45   Critical care time was exclusive of:  Separately billable procedures and treating other patients   Critical care was necessary to treat or prevent imminent or life-threatening deterioration of the following conditions:  CNS failure  or compromise and respiratory failure (Alcohol withdrawal seizures, DTs, aspiration pneumonia, requiring intubation for airway protection)   Critical care was time spent personally by me on the following activities:  Discussions with consultants, evaluation of patient's response to treatment, examination of patient, ordering and performing treatments and interventions, ordering and review of laboratory studies, ordering and review of radiographic studies, pulse oximetry, re-evaluation of patient's condition, obtaining history from patient or surrogate and review of old charts   Care discussed with: admitting provider       Medications Ordered in ED Medications  LORazepam (ATIVAN) injection 0-4 mg (2 mg Intravenous Given 12/17/20 1713)    Or  LORazepam (ATIVAN) tablet 0-4 mg ( Oral See Alternative 12/17/20 1713)  LORazepam (ATIVAN) injection 0-4 mg (has no administration in time range)    Or  LORazepam (ATIVAN) tablet 0-4 mg (has no administration in time range)  thiamine tablet 100 mg ( Oral See Alternative 12/17/20 1714)    Or  thiamine (B-1) injection 100 mg (100 mg Intravenous Given 12/17/20 1714)  Ampicillin-Sulbactam (UNASYN) 3 g in sodium chloride 0.9 % 100 mL IVPB (3 g Intravenous New Bag/Given (Non-Interop) 12/17/20 1852)  succinylcholine (ANECTINE) 200 MG/10ML syringe (has no administration in time range)  etomidate (AMIDATE) 2 MG/ML injection (has no administration in time range)  propofol (DIPRIVAN) 1000 MG/100ML infusion (has no administration in time range)  propofol (DIPRIVAN) 1000 MG/100ML infusion (has  no administration in time range)  sodium chloride 0.9 % bolus 1,000 mL (0 mLs Intravenous Stopped 12/17/20 1751)  ondansetron (ZOFRAN) injection 4 mg (4 mg Intravenous Given 12/17/20 1608)  LORazepam (ATIVAN) 2 MG/ML injection (  Given 12/17/20 1633)  LORazepam (ATIVAN) injection 4 mg (4 mg Intravenous Given 12/17/20 1811)  propofol (DIPRIVAN) 10 mg/mL bolus/IV push 100 mg (100 mg Intravenous Given 12/17/20 1850)  LORazepam (ATIVAN) injection 4 mg (4 mg Intravenous Given 12/17/20 1850)    ED Course  I have reviewed the triage vital signs and the nursing notes.  Pertinent labs & imaging results that were available during my care of the patient were reviewed by me and considered in my medical decision making (see chart for details).    MDM Rules/Calculators/A&P                         50 year old female arrives via EMS with emesis, altered mental status and had witnessed seizure in route with EMS.  Has history of significant alcohol and polysubstance abuse and was dropped off at the mom's house at 4 AM after substance abuse and was found to be intoxicated initially was responsive and seemed relatively normal per mom but then had several episodes of emesis and became increasingly confused and somnolent.  Patient is also benzodiazepine dependent and has been taking more of her Klonopin the directed prescription.  Will order basic labs, urinalysis, UDS, ethanol levels, patient is currently on 2 L nasal cannula is somnolent and very ill-appearing.  Concern for potential aspiration as well.  Will get chest x-ray and head CT.  No obvious signs of trauma from seizure and patient's mom reports she was in the bed and did not appear to fall to the floor.  Suspect seizures are likely in the setting of alcohol withdrawal, as well as possible substance abuse that could lower seizure threshold, also consider metabolic derangements or infection, feel these are less likely.  Will give IV fluids, will hold off on giving  additional seizure medications at this time  is seizure with EMS was brief and self resolved and patient has taken an unknown amount of Klonopin already today.  Notified by radiology staff that patient had another approximately 30 sec seizure while in CT, afterwards was once again, somewhat agitated.  Patient given 2 of Ativan to help prevent further seizures, high clinical suspicion for alcohol withdrawal seizures.  Lab work reviewed and significant for leukocytosis of 27, likely in the setting of seizure, no significant electrolyte derangement, creatinine slightly elevated from baseline at 1.13, likely in the setting of dehydration, AST mildly elevated at 44, but LFTs otherwise unremarkable, anion gap of 20, suspect this is likely in setting of lactic acidosis in the setting of seizure, alcohol could also be contributing.  Patient's ethanol level is negative, this further supports concern for alcohol withdrawal seizures given patient's persistent history of substance use.  Patient started on CIWA protocol and given additional 1 mg of Ativan  Chest x-ray with multifocal airspace consolidation, right greater than left, most consistent with multifocal pneumonia and given history this raises concern for aspiration.  CT of the head is normal with no acute intracranial abnormalities.  After second seizure had long discussion with patient's mother at bedside regarding need to protect patient's airway, at this time she has recovered from seizure, is satting well and seems to be a bit more alert than upon arrival, concern patient is going to be very agitated and difficult to sedate on ventilator due to substance abuse history so made decision to hold off, but if patient has change in respiratory status or additional seizures, or has any more vomiting will need intubation for airway protection.  Called to room by nursing staff patient had a third 20-30-second seizure, upon my arrival to the room patient is  agitated and is very postictal.  At this point with recurrent seizures, likely in the setting of alcohol withdrawal that are going to require increasing doses of medication to control feel patient will need intubation for airway protection.  Patient given an additional 4 mg of Ativan.  Patient successfully intubated with 7.0 ET tube with 2 intubation attempts, see Dr. Winnifred Friar separate documentation.  After intubation patient started on propofol drip for sedation with continued as needed benzos.  Restraints applied, OG tube and 10 Foley placed.  Consult placed to critical care for admission to the ICU for acute respiratory failure and alcohol withdrawal seizures and DTs.  Patient requiring increasing meds for sedation.   Case discussed with Dr. Myrla Halsted with critical care, recommends adding fentanyl drip for sedation, would hold off on Precedex at this time, continue as needed benzos.  Critical care team will be down shortly to see and admit patient   Final Clinical Impression(s) / ED Diagnoses Final diagnoses:  Acute respiratory failure with hypoxia (HCC)  Alcohol withdrawal seizure with delirium [facility])    Rx / DC Orders ED Discharge Orders    None       Legrand Rams 12/18/20 0024    Shon Baton, MD 12/20/20 1226

## 2020-12-17 NOTE — [facility] Counselor (Signed)
This counselor received a phone call from patient's mother, Pricilla Riffle [phone], requesting the name of patient's therapist so she can contact her. This counselor informed her that unless we had consent we could disclose any of her information. She states that she understood but wanted to inform us that her daughter is currently in the ICU intubated, going through detox. She wants to secure follow up treatment for patient and states that she has joined patient in her therapy sessions. I do not see any documentation of consent to share information but told mother I would make a note of it and attempt to let her outpatient counselor know.

## 2020-12-17 NOTE — H&P (Signed)
NAME:  Margaret Mccann, MRN:  [id number], DOB:  1971/03/13, LOS: 0 ADMISSION DATE:  12/17/2020, CONSULTATION DATE:  12/17/2020 REFERRING MD:  ED physician, CHIEF COMPLAINT:  Admitted with altered mentation  History of Present Illness:  Patient with a history of bipolar disorder, alcohol abuse, PTSD Have been out drinking Came in about 4 AM according to mother intoxicated Slept all day Patient was found by mother lethargic, she had vomited, altered mentation EMS activated Had a seizure on route to the hospital  Pertinent  Medical History   Past Medical History:  Diagnosis Date  . Anxiety   . Bipolar 1 disorder (HCC)   . Depression   . Morgellons syndrome    Significant Hospital Events: Including procedures, antibiotic start and stop dates in addition to other pertinent events   . 3/25 intubated  Interim History / Subjective:  Patient is sedated  Objective   Blood pressure 122/66, pulse 98, temperature 97.9 F (36.6 C), temperature source Oral, resp. rate 19, height 5\' 3"  (1.6 m), SpO2 100 %.    Vent Mode: PRVC FiO2 (%):  [100 %] 100 % Set Rate:  [16 bmp] 16 bmp Vt Set:  [420 mL] 420 mL PEEP:  [5 cmH20] 5 cmH20 Plateau Pressure:  [19 cmH20] 19 cmH20  No intake or output data in the 24 hours ending 12/17/20 1942 There were no vitals filed for this visit.  Examination: General: Middle-aged lady, does not appear to be in distress HENT: Moist oral mucosa, endotracheal tube in place Lungs: Clear breath sounds, reduced at the bases Cardiovascular: S1-S2 appreciated Abdomen: Bowel sounds appreciated Extremities: No clubbing, no edema Neuro: Sedated, she was moving all extremities according to her nurse GU:   Labs/imaging that I havepersonally reviewed  (right click and "Reselect all SmartList Selections" daily)  Chest x-ray reviewed showing multifocal infiltrate, right upper lobe, retrocardiac, right lower lobe  Chem-7 significant for hypokalemia, elevated  creatinine  Blood cultures drawn x2  Resolved Hospital Problem list     Assessment & Plan:  12/19/20  Acute hypoxemic respiratory failure -Continue full ventilator support -Weaning as tolerated  Agitation management -Fentanyl-drip -Propofol infusion  .  Aspiration pneumonia -Continue Unasyn for aspiration pneumonia -Trend CBC -Plan is for 5 days of antibiotics at present  .  Alcohol withdrawal seizures -Neurochecks -Close monitoring -We will likely need sedated for 24 to 48 hours she drinks heavily  .  Alcoholism -Has had multiple hospitalizations and rehabs which she has failed -Mother continues to work on getting help for her  -Thiamine -MVI  .  Bipolar disorder -Sees behavioral health locally -Medications been optimized  .  History of PTSD -Sees behavioral health locally  Will hold home medications at present   Best practice (right click and "Reselect all SmartList Selections" daily)  Diet:  Tube Feed  Pain/Anxiety/Delirium protocol (if indicated): Yes (RASS goal -1) VAP protocol (if indicated): Yes DVT prophylaxis: LMWH GI prophylaxis: H2B Glucose control:  SSI Yes Central venous access:  N/A Arterial line:  N/A Foley:  Yes, and it is still needed Mobility:  bed rest  PT consulted: N/A Last date of multidisciplinary goals of care discussion [pending] Code Status:  full code Disposition: ICU  Labs   CBC: Recent Labs  Lab 12/17/20 1553  WBC 27.6*  NEUTROABS 24.0*  HGB 13.8  HCT 43.2  MCV 99.1  PLT 249    Basic Metabolic Panel: Recent Labs  Lab 12/17/20 1708  NA 141  K 3.4*  CL 111  CO2 10*  GLUCOSE 198*  BUN 11  CREATININE 1.13*  CALCIUM 8.4*   GFR: CrCl cannot be calculated (Unknown ideal weight.). Recent Labs  Lab 12/17/20 1553  WBC 27.6*    Liver Function Tests: Recent Labs  Lab 12/17/20 1708  AST 44*  ALT 39  ALKPHOS 98  BILITOT 0.9  PROT 7.0  ALBUMIN 3.4*   No results for input(s): LIPASE, AMYLASE in the last 168  hours. No results for input(s): AMMONIA in the last 168 hours.  ABG    Component Value Date/Time   PHART 7.357 08/04/2020 2126   PCO2ART 41.4 08/04/2020 2126   PO2ART 95 08/04/2020 2126   HCO3 24.1 08/04/2020 2126   TCO2 25 08/04/2020 2126   ACIDBASEDEF 3.0 (H) 08/04/2020 2126   O2SAT 98.0 08/04/2020 2126     Coagulation Profile: No results for input(s): INR, PROTIME in the last 168 hours.  Cardiac Enzymes: No results for input(s): CKTOTAL, CKMB, CKMBINDEX, TROPONINI in the last 168 hours.  HbA1C: No results found for: HGBA1C  CBG: No results for input(s): GLUCAP in the last 168 hours.  Review of Systems:   Unobtainable from the patient, discussed with mother at bedside  Past Medical History:  She,  has a past medical history of Anxiety, Bipolar 1 disorder (HCC), Depression, and Morgellons syndrome.   Surgical History:   Past Surgical History:  Procedure Laterality Date  . APPENDECTOMY       Social History:   reports that she has quit smoking. Her smoking use included cigarettes. She smoked 0.50 packs per day. She has never used smokeless tobacco. She reports current alcohol use. She reports previous drug use.   Family History:  Her family history includes Breast cancer in her paternal aunt; Heart attack (age of onset: 44) in her father; Stroke in her father. There is no history of Ulcerative colitis or Esophageal cancer.   Allergies Allergies  Allergen Reactions  . Lamictal [Lamotrigine] Anaphylaxis, Rash and Other (See Comments)    Stevens-Johnson syndrome and fevers, also  . Lamictal [Lamotrigine] Anaphylaxis, Rash and Other (See Comments)    Fevers and Stevens-Johnson syndrome, also  . Tramadol Other (See Comments)    Pt had a seizure after taking Tramadol!!  . Sulfamethoxazole-Trimethoprim Rash  . Erythromycin Nausea And Vomiting  . Erythromycin Base Nausea And Vomiting  . Geodon [Ziprasidone Hcl] Rash  . Levetiracetam Anxiety  . Sulfa Antibiotics Rash      Home Medications  Prior to Admission medications   Medication Sig Start Date End Date Taking? Authorizing Provider  acetaminophen (TYLENOL) 500 MG tablet Take 1,000 mg by mouth every 6 (six) hours as needed for moderate pain.    [provider]  clonazePAM (KLONOPIN) 0.5 MG tablet Take 1 tablet (0.5 mg total) by mouth 2 (two) times daily. 12/02/20   Zena Amos, MD  cloNIDine (CATAPRES) 0.2 MG tablet Take 1 tablet (0.2 mg total) by mouth at bedtime. 12/02/20   Zena Amos, MD  FLUoxetine (PROZAC) 20 MG capsule Take 3 capsules (60 mg total) by mouth daily. 12/02/20   Zena Amos, MD  gabapentin (NEURONTIN) 600 MG tablet Take 2 tablets (1,200 mg total) by mouth 2 (two) times daily. 12/02/20   Zena Amos, MD  hydrOXYzine (ATARAX/VISTARIL) 25 MG tablet Take 1 tablet (25 mg total) by mouth 3 (three) times daily as needed for anxiety. 12/02/20   Zena Amos, MD  mirtazapine (REMERON) 30 MG tablet Take 1 tablet (30 mg total) by mouth at bedtime. 12/02/20  Zena Amos, MD    The patient is critically ill with multiple organ systems failure and requires high complexity decision making for assessment and support, frequent evaluation and titration of therapies, application of advanced monitoring technologies and extensive interpretation of multiple databases. Critical Care Time devoted to patient care services described in this note independent of APP/resident time (if applicable)  is 35 minutes.   Virl Diamond MD [facility] Personal pager: [phone] If unanswered, please page CCM On-call: #[phone]

## 2020-12-17 NOTE — ED Triage Notes (Signed)
Patient presents via EMS from home. Per mother, patient came home around 0400 intoxicated. Patient has been asleep all day. Patient parents checked on patient around 1400 and found that she had vomited and was lethargic. Mother states she checked on her all morning and patient was mentating normally. Hx of Rx pill abuse. 1 Bottle is missing at least 10 pills, but patient does not take medication like she is supposed to. Patient had a seizure en route per EMS 20-30 seconds long. No meds given. Patient noted to be altered   140/96 120 94% 26 RR CBG 206

## 2020-12-17 NOTE — ED Provider Notes (Signed)
Patient presents with altered mental status.  Significant polysubstance abuse.  Vomited per her mother.  She had a seizure in route.  She takes benzodiazepines.  Also significant alcohol history and other drug use.  Patient had a second seizure while in the emergency department.  On my evaluation, she is postictal.  She is requiring 2 L of oxygen.  High suspicion for aspiration.  Chest x-ray confirms aspiration.  Patient was given broad-spectrum antibiotics.  Discussed with mother at length concern for recurrent seizures and alcohol withdrawal given history.  Alcohol level today is 0.  Patient ultimately received a total of 7 mg of Ativan for recurrent seizure.  Decision was made to intubate the patient as she had a third seizure and an episode of hypoxia.  Physical Exam  BP (!) 112/59   Pulse 92   Temp 99.1 F (37.3 C)   Resp (!) 23   Ht 1.6 m (5' 3" )   Wt 69.4 kg   SpO2 100%   BMI 27.10 kg/m   Physical Exam  Altered, postictal on my evaluation Coarse breath sounds bilaterally Moves all 4 extremities  ED Course/Procedures     .Critical Care E&M Performed by: Merryl Hacker, MD  Critical care provider statement:    Critical care time (minutes):  45   Critical care was necessary to treat or prevent imminent or life-threatening deterioration of the following conditions:  Respiratory failure and CNS failure or compromise   Critical care was time spent personally by me on the following activities:  Review of old charts, re-evaluation of patient's condition, pulse oximetry, examination of patient, evaluation of patient's response to treatment and discussions with consultants   I assumed direction of critical care for this patient from another provider in my specialty: no   After initial E/M assessment, critical care services were subsequently performed that were exclusive of separately billable procedures or treatment.   Procedure Name: Intubation Date/Time: 12/17/2020 11:57  PM Performed by: Merryl Hacker, MD Pre-anesthesia Checklist: Patient identified Oxygen Delivery Method: Nasal cannula Preoxygenation: Pre-oxygenation with 100% oxygen Induction Type: Rapid sequence Ventilation: Mask ventilation without difficulty Laryngoscope Size: Glidescope and 3 Grade View: Grade I Tube size: 7.0 mm Number of attempts: 2 Placement Confirmation: ETT inserted through vocal cords under direct vision,  Positive ETCO2 and Breath sounds checked- equal and bilateral Secured at: 24 cm Tube secured with: ETT holder Dental Injury: Teeth and Oropharynx as per pre-operative assessment  Difficulty Due To: Difficulty was unanticipated Comments: Patient was intubated on second attempt.  Unable to pass a 7.5 ET tube.  She appeared to have some redundant vocal cord tissue and significant resistance was met.  She was bagged and intubated successfully on first pass with a 7.0 ET tube.     Angiocath insertion Performed by: Merryl Hacker  Consent: Verbal consent obtained. Risks and benefits: risks, benefits and alternatives were discussed Time out: Immediately prior to procedure a "time out" was called to verify the correct patient, procedure, equipment, support staff and site/side marked as required.  Preparation: Patient was prepped and draped in the usual sterile fashion.  Vein Location: right EJ   Gauge: 18  Normal blood return and flush without difficulty Patient tolerance: Patient tolerated the procedure well with no immediate complications.     MDM   Patient presents with recurrent seizure activity.  Appears to be in DTs.  Patient was intubated for airway control and recurrent seizure activity.  She also has an aspiration pneumonia.  Plan for admission to ICU.       Merryl Hacker, MD 12/18/20 [id number]

## 2020-12-17 NOTE — Progress Notes (Signed)
eLink Physician-Brief Progress Note Patient Name: Margaret Mccann DOB: 02-15-1971 MRN: [id number]   Date of Service  12/17/2020  HPI/Events of Note  Bedside RN requesting KUB review s/p NG tube placement.  eICU Interventions  Reviewed KUB and verified that NG tube is in good position in the stomach.        Migdalia Dk 12/17/2020, 11:31 PM

## 2020-12-17 NOTE — ED Notes (Signed)
ED TO INPATIENT HANDOFF REPORT  Name/Age/Gender Margaret Mccann Yaun 50 y.o. female  Code Status    Code Status Orders  (From admission, onward)         Start     Ordered   12/17/20 1939  Full code  Continuous        12/17/20 1940        Code Status History    Date Active Date Inactive Code Status Order ID Comments User Context   09/10/2020 2020 09/11/2020 0306 Full Code [id number]  Cherly Andersonetrucelli, Samantha R, PA-C ED   08/11/2020 1642 08/14/2020 1100 Full Code [id number]  Maryagnes AmosStarkes-Perry, Takia S, FNP Inpatient   08/04/2020 2246 08/11/2020 1631 Full Code [id number]  Duayne CalHoffman, Paul W, NP ED   06/04/2018 1749 06/08/2018 2218 Full Code [id number]  Rankin, Shuvon B, NP Inpatient   06/04/2018 1748 06/04/2018 1749 Full Code [id number]  Talmage Napankin, Shuvon B, NP Inpatient   06/02/2018 1911 06/04/2018 1627 Full Code [id number]  Elder LoveKuhlman, Patrick D, MD Inpatient   08/28/2012 2345 08/29/2012 1933 Full Code [id number]  Laray AngerMcManus, Kathleen M, DO ED   05/31/2012 2212 06/03/2012 0353 Full Code [id number]  Glynn Octaveancour, Stephen, MD ED   05/31/2012 0627 05/31/2012 2032 Full Code [id number]  Palumbo-Rasch, April K, MD ED   Advance Care Planning Activity      Home/SNF/Other Home  Chief Complaint Acute respiratory failure with hypoxemia (HCC) [J96.01]  Level of Care/Admitting Diagnosis ED Disposition    ED Disposition Condition Comment   Admit  [facility] [[id number]]  Level of Care: ICU [6]  [facility] if equivalent level of care is available:: Yes  Covid Evaluation: Confirmed COVID Negative  Diagnosis: Acute respiratory failure with hypoxemia [facility](HCC) [[id number]]) [[id number]]  Admitting Physician: Tomma LightningLALERE, ADEWALE A [[id number]]  Attending Physician: Tomma LightningLALERE, ADEWALE A [[id number]]  Estimated length of stay: 3 - 4 days  Certification:: I certify this patient will need inpatient services for at least 2 midnights       Medical History Past Medical History:  Diagnosis Date   . Anxiety   . Bipolar 1 disorder (HCC)   . Depression   . Morgellons syndrome     Allergies Allergies  Allergen Reactions  . Lamictal [Lamotrigine] Anaphylaxis, Rash and Other (See Comments)    Stevens-Johnson syndrome and fevers, also  . Lamictal [Lamotrigine] Anaphylaxis, Rash and Other (See Comments)    Fevers and Stevens-Johnson syndrome, also  . Tramadol Other (See Comments)    Pt had a seizure after taking Tramadol!!  . Sulfamethoxazole-Trimethoprim Rash  . Erythromycin Nausea And Vomiting  . Erythromycin Base Nausea And Vomiting  . Geodon [Ziprasidone Hcl] Rash  . Levetiracetam Anxiety  . Sulfa Antibiotics Rash    IV Location/Drains/Wounds Patient Lines/Drains/Airways Status    Active Line/Drains/Airways    Name Placement date Placement time Site Days   Peripheral IV 12/17/20 Right;Posterior Forearm 12/17/20  1635  Forearm  less than 1   Peripheral IV 12/17/20 Right Antecubital 12/17/20  1953  Antecubital  less than 1   Peripheral IV 12/17/20 Right External jugular 12/17/20  1938  External jugular  less than 1   NG/OG Tube Orogastric 16 Fr. Center mouth Xray;Aucultation 12/17/20  1845  Center mouth  less than 1   Airway 7 mm 12/17/20  1830  - less than 1          Labs/Imaging Results for orders placed or performed during the hospital encounter of 12/17/20 (from the past  48 hour(s))  CBC with Differential     Status: Abnormal   Collection Time: 12/17/20  3:53 PM  Result Value Ref Range   WBC 27.6 (H) 4.0 - 10.5 K/uL   RBC 4.36 3.87 - 5.11 MIL/uL   Hemoglobin 13.8 12.0 - 15.0 g/dL   HCT 16.1 09.6 - 04.5 %   MCV 99.1 80.0 - 100.0 fL   MCH 31.7 26.0 - 34.0 pg   MCHC 31.9 30.0 - 36.0 g/dL   RDW 40.9 81.1 - 91.4 %   Platelets 249 150 - 400 K/uL   nRBC 0.0 0.0 - 0.2 %   Neutrophils Relative % 85 %   Neutro Abs 24.0 (H) 1.7 - 7.7 K/uL   Band Neutrophils 2 %   Lymphocytes Relative 7 %   Lymphs Abs 1.9 0.7 - 4.0 K/uL   Monocytes Relative 3 %   Monocytes  Absolute 0.8 0.1 - 1.0 K/uL   Eosinophils Relative 0 %   Eosinophils Absolute 0.0 0.0 - 0.5 K/uL   Basophils Relative 0 %   Basophils Absolute 0.0 0.0 - 0.1 K/uL   WBC Morphology MILD LEFT SHIFT (1-5% METAS, OCC MYELO, OCC BANDS)    Metamyelocytes Relative 2 %   Myelocytes 1 %   Abs Immature Granulocytes 0.80 (H) 0.00 - 0.07 K/uL   Reactive, Benign Lymphocytes PRESENT     Comment: Performed at [facility], [address], [place], [place] [address]  Ethanol     Status: None   Collection Time: 12/17/20  3:53 PM  Result Value Ref Range   Alcohol, Ethyl (B) <10 <10 mg/dL    Comment: (NOTE) Lowest detectable limit for serum alcohol is 10 mg/dL.  For medical purposes only. Performed at [facility], [address], [place], [place] [address]   hCG, quantitative, pregnancy     Status: None   Collection Time: 12/17/20  3:53 PM  Result Value Ref Range   hCG, Beta Chain, Quant, S 1 <5 mIU/mL    Comment:          GEST. AGE      CONC.  (mIU/mL)   <=1 WEEK        5 - 50     2 WEEKS       50 - 500     3 WEEKS       100 - 10,000     4 WEEKS     1,000 - 30,000     5 WEEKS     3,500 - 115,000   6-8 WEEKS     12,000 - 270,000    12 WEEKS     15,000 - 220,000        FEMALE AND NON-PREGNANT FEMALE:     LESS THAN 5 mIU/mL Performed at [facility], [address] [place], [place] [address]   I-Stat beta hCG blood, ED     Status: Abnormal   Collection Time: 12/17/20  4:09 PM  Result Value Ref Range   I-stat hCG, quantitative 66.0 (H) <5 mIU/mL   Comment 3            Comment:   GEST. AGE      CONC.  (mIU/mL)   <=1 WEEK        5 - 50     2 WEEKS       50 - 500     3 WEEKS       100 - 10,000  4 WEEKS     1,000 - 30,000        FEMALE AND NON-PREGNANT FEMALE:     LESS THAN 5 mIU/mL   Resp Panel by RT-PCR (Flu A&B, Covid) Nasopharyngeal Swab     Status: None   Collection Time: 12/17/20  4:47 PM   Specimen: Nasopharyngeal Swab;  Nasopharyngeal(NP) swabs in vial transport medium  Result Value Ref Range   SARS Coronavirus 2 by RT PCR NEGATIVE NEGATIVE    Comment: (NOTE) SARS-CoV-2 target nucleic acids are NOT DETECTED.  The SARS-CoV-2 RNA is generally detectable in upper respiratory specimens during the acute phase of infection. The lowest concentration of SARS-CoV-2 viral copies this assay can detect is 138 copies/mL. A negative result does not preclude SARS-Cov-2 infection and should not be used as the sole basis for treatment or other patient management decisions. A negative result may occur with  improper specimen collection/handling, submission of specimen other than nasopharyngeal swab, presence of viral mutation(s) within the areas targeted by this assay, and inadequate number of viral copies(<138 copies/mL). A negative result must be combined with clinical observations, patient history, and epidemiological information. The expected result is Negative.  Fact Sheet for Patients:  BloggerCourse.com  Fact Sheet for Healthcare Providers:  SeriousBroker.it  This test is no t yet approved or cleared by the [place] FDA and  has been authorized for detection and/or diagnosis of SARS-CoV-2 by FDA under an Emergency Use Authorization (EUA). This EUA will remain  in effect (meaning this test can be used) for the duration of the COVID-19 declaration under Section 564(b)(1) of the Act, 21 U.S.C.section 360bbb-3(b)(1), unless the authorization is terminated  or revoked sooner.       Influenza A by PCR NEGATIVE NEGATIVE   Influenza B by PCR NEGATIVE NEGATIVE    Comment: (NOTE) The Xpert Xpress SARS-CoV-2/FLU/RSV plus assay is intended as an aid in the diagnosis of influenza from Nasopharyngeal swab specimens and should not be used as a sole basis for treatment. Nasal washings and aspirates are unacceptable for Xpert Xpress  SARS-CoV-2/FLU/RSV testing.  Fact Sheet for Patients: BloggerCourse.com  Fact Sheet for Healthcare Providers: SeriousBroker.it  This test is not yet approved or cleared by the [place] FDA and has been authorized for detection and/or diagnosis of SARS-CoV-2 by FDA under an Emergency Use Authorization (EUA). This EUA will remain in effect (meaning this test can be used) for the duration of the COVID-19 declaration under Section 564(b)(1) of the Act, 21 U.S.C. section 360bbb-3(b)(1), unless the authorization is terminated or revoked.  Performed at [facility], [address], [place], [place] [address]   Comprehensive metabolic panel     Status: Abnormal   Collection Time: 12/17/20  5:08 PM  Result Value Ref Range   Sodium 141 135 - 145 mmol/L   Potassium 3.4 (L) 3.5 - 5.1 mmol/L   Chloride 111 98 - 111 mmol/L   CO2 10 (L) 22 - 32 mmol/L   Glucose, Bld 198 (H) 70 - 99 mg/dL    Comment: Glucose reference range applies only to samples taken after fasting for at least 8 hours.   BUN 11 6 - 20 mg/dL   Creatinine, Ser 2.29 (H) 0.44 - 1.00 mg/dL   Calcium 8.4 (L) 8.9 - 10.3 mg/dL   Total Protein 7.0 6.5 - 8.1 g/dL   Albumin 3.4 (L) 3.5 - 5.0 g/dL   AST 44 (H) 15 - 41 U/L   ALT 39 0 - 44 U/L  Alkaline Phosphatase 98 38 - 126 U/L   Total Bilirubin 0.9 0.3 - 1.2 mg/dL   GFR, Estimated >59 >93 mL/min    Comment: (NOTE) Calculated using the CKD-EPI Creatinine Equation (2021)    Anion gap 20 (H) 5 - 15    Comment: Performed at [facility], [address], [place], [place] [address]  Blood gas, arterial     Status: Abnormal (Preliminary result)   Collection Time: 12/17/20  7:36 PM  Result Value Ref Range   FIO2 100.00    Delivery systems VENTILATOR    Mode PRESSURE REGULATED VOLUME CONTROL    VT 420 mL   LHR 16 resp/min   Peep/cpap 5.0 cm H20   pH, Arterial 7.209 (L) 7.350 - 7.450    pCO2 arterial 26.9 (L) 32.0 - 48.0 mmHg   pO2, Arterial 48.8 (L) 83.0 - 108.0 mmHg   Bicarbonate 10.5 (L) 20.0 - 28.0 mmol/L   Acid-base deficit 16.0 (H) 0.0 - 2.0 mmol/L   O2 Saturation 76.5 %   Patient temperature 97.5    Collection site RIGHT BRACHIAL    Drawn by [id number]     Comment: Performed at [facility], [address], [place], [place] [address]   Allens test (pass/fail) PENDING PASS  CBG monitoring, ED     Status: Abnormal   Collection Time: 12/17/20  8:11 PM  Result Value Ref Range   Glucose-Capillary 101 (H) 70 - 99 mg/dL    Comment: Glucose reference range applies only to samples taken after fasting for at least 8 hours.   CT Head Wo Contrast  Result Date: 12/17/2020 CLINICAL DATA:  Altered mental status.  Possible overdose. EXAM: CT HEAD WITHOUT CONTRAST TECHNIQUE: Contiguous axial images were obtained from the base of the skull through the vertex without intravenous contrast. COMPARISON:  Jan 29, 2020. FINDINGS: Brain: No evidence of acute infarction, hemorrhage, hydrocephalus, extra-axial collection or mass lesion/mass effect. Vascular: No hyperdense vessel or unexpected calcification. Skull: Normal. Negative for fracture or focal lesion. Sinuses/Orbits: No acute finding. Other: None. IMPRESSION: Normal head CT. Electronically Signed   By: Lupita Raider M.D.   On: 12/17/2020 16:53   DG Chest Port 1 View  Result Date: 12/17/2020 CLINICAL DATA:  Post seizure.  Possible aspiration. EXAM: PORTABLE CHEST 1 VIEW COMPARISON:  Chest radiograph 12/01/2020, CT 12/02/2020 FINDINGS: Airspace consolidation throughout the right upper lobe, lesser patchy opacity at the right lung base. There is also likely retrocardiac opacity. Heart is normal in size with unchanged mediastinal contours. The patient is rotated. No pneumothorax or significant pleural effusion. No acute osseous abnormalities are seen. IMPRESSION: Multifocal airspace consolidation, right greater than left, most  consistent with multifocal pneumonia. Given provided history, aspiration is suspected. Electronically Signed   By: Narda Rutherford M.D.   On: 12/17/2020 17:09    Pending Labs Unresulted Labs (From admission, onward)          Start     Ordered   12/24/20 0500  Creatinine, serum  (enoxaparin (LOVENOX)    CrCl >/= 30 ml/min)  Weekly,   R     Comments: while on enoxaparin therapy    12/17/20 1940   12/18/20 0500  Triglycerides  (propofol (DIPRIVAN))  Every 72 hours,   R (with STAT occurrences)     Comments: While on propofol (DIPRIVAN)    12/17/20 1900   12/18/20 0500  CBC  Tomorrow morning,   R        12/17/20 1940  12/18/20 0500  Basic metabolic panel  Tomorrow morning,   R        12/17/20 1940   12/17/20 1951  Hemoglobin A1c  Once,   STAT       Comments: To assess prior glycemic control    12/17/20 1951   12/17/20 1942  Magnesium  (ICU Tube Feeding: PEPuP )  5A & 5P,   R (with STAT occurrences)      12/17/20 1941   12/17/20 1942  Phosphorus  (ICU Tube Feeding: PEPuP )  5A & 5P,   R (with STAT occurrences)      12/17/20 1941   12/17/20 1937  CBC  (enoxaparin (LOVENOX)    CrCl >/= 30 ml/min)  Once,   STAT       Comments: Baseline for enoxaparin therapy IF NOT ALREADY DRAWN.  Notify MD if PLT < 100 K.    12/17/20 1940   12/17/20 1937  Creatinine, serum  (enoxaparin (LOVENOX)    CrCl >/= 30 ml/min)  Once,   STAT       Comments: Baseline for enoxaparin therapy IF NOT ALREADY DRAWN.    12/17/20 1940   12/17/20 1729  Culture, blood (routine x 2)  BLOOD CULTURE X 2,   STAT      12/17/20 1728   12/17/20 1546  Urinalysis, Routine w reflex microscopic Urine, Clean Catch  ONCE - STAT,   STAT        12/17/20 1546   12/17/20 1546  Urine rapid drug screen (hosp performed)  ONCE - STAT,   STAT        12/17/20 1546          Vitals/Pain Today's Vitals   12/17/20 1831 12/17/20 1845 12/17/20 1915 12/17/20 1957  BP:  (!) 157/80 122/66   Pulse: (!) 122 (!) 114 98   Resp: 16 (!) 31 19    Temp:      TempSrc:      SpO2: 100% 99% 100%   Weight:    71 kg  Height:  (1.6 m)       Isolation Precautions Airborne and Contact precautions  Medications Medications  LORazepam (ATIVAN) injection 0-4 mg (2 mg Intravenous Given 12/17/20 1713)    Or  LORazepam (ATIVAN) tablet 0-4 mg ( Oral See Alternative 12/17/20 1713)  LORazepam (ATIVAN) injection 0-4 mg (has no administration in time range)    Or  LORazepam (ATIVAN) tablet 0-4 mg (has no administration in time range)  thiamine tablet 100 mg ( Oral See Alternative 12/17/20 1714)    Or  thiamine (B-1) injection 100 mg (100 mg Intravenous Given 12/17/20 1714)  fentaNYL in NS (22mcg/ml) infusion-PREMIX (125 mcg/hr Intravenous Rate/Dose Change 12/17/20 2009)  fentaNYL (SUBLIMAZE) bolus via infusion 50 mcg (50 mcg Intravenous Bolus from Bag 12/17/20 2018)  ondansetron (ZOFRAN) injection 4 mg (has no administration in time range)  docusate sodium (COLACE) capsule 100 mg (has no administration in time range)  polyethylene glycol (MIRALAX / GLYCOLAX) packet 17 g (has no administration in time range)  enoxaparin (LOVENOX) injection 40 mg (has no administration in time range)  famotidine (PEPCID) IVPB 20 mg premix (has no administration in time range)  feeding supplement (VITAL HIGH PROTEIN) liquid 1,000 mL (has no administration in time range)  feeding supplement (PROSource TF) liquid 45 mL (has no administration in time range)  insulin aspart (novoLOG) injection 0-15 Units (has no administration in time range)  lactated ringers infusion (has no administration in  time range)  Ampicillin-Sulbactam (UNASYN) 3 g in sodium chloride 0.9 % 100 mL IVPB (has no administration in time range)  multivitamin liquid 15 mL (has no administration in time range)  propofol (DIPRIVAN) 1000 MG/100ML infusion (25 mcg/kg/min  71 kg Intravenous Rate/Dose Change 12/17/20 2017)  LORazepam (ATIVAN) injection 2 mg (has no administration in time  range)  sodium chloride 0.9 % bolus 1,000 mL (0 mLs Intravenous Stopped 12/17/20 1751)  ondansetron (ZOFRAN) injection 4 mg (4 mg Intravenous Given 12/17/20 1608)  LORazepam (ATIVAN) 2 MG/ML injection (  Given 12/17/20 1633)  Ampicillin-Sulbactam (UNASYN) 3 g in sodium chloride 0.9 % 100 mL IVPB (0 g Intravenous Stopped 12/17/20 2015)  LORazepam (ATIVAN) injection 4 mg (4 mg Intravenous Given 12/17/20 1811)  succinylcholine (ANECTINE) 200 MG/10ML syringe (  Given 12/17/20 1823)  etomidate (AMIDATE) 2 MG/ML injection (  Given 12/17/20 1823)  propofol (DIPRIVAN) 10 mg/mL bolus/IV push 100 mg (100 mg Intravenous Given 12/17/20 1850)  LORazepam (ATIVAN) injection 4 mg (4 mg Intravenous Given 12/17/20 1850)  fentaNYL (SUBLIMAZE) injection 50 mcg (50 mcg Intravenous Given 12/17/20 1905)    [employer] [facility] [employer]

## 2020-12-18 ENCOUNTER — Inpatient Hospital Stay (HOSPITAL_COMMUNITY): Payer: Self-pay

## 2020-12-18 ENCOUNTER — Other Ambulatory Visit: Payer: Self-pay

## 2020-12-18 DIAGNOSIS — R0603 Acute respiratory distress: Secondary | ICD-10-CM

## 2020-12-18 DIAGNOSIS — I071 Rheumatic tricuspid insufficiency: Secondary | ICD-10-CM | POA: Clinically undetermined

## 2020-12-18 HISTORY — DX: Rheumatic tricuspid insufficiency: I07.1

## 2020-12-18 LAB — BLOOD GAS, ARTERIAL
Acid-base deficit: 16 mmol/L — ABNORMAL HIGH (ref 0.0–2.0)
Acid-base deficit: 4.5 mmol/L — ABNORMAL HIGH (ref 0.0–2.0)
Bicarbonate: 10.5 mmol/L — ABNORMAL LOW (ref 20.0–28.0)
Bicarbonate: 22.9 mmol/L (ref 20.0–28.0)
Drawn by: 11249
FIO2: 100
FIO2: 100
MECHVT: 420 mL
MECHVT: 420 mL
O2 Saturation: 76.5 %
O2 Saturation: 99.8 %
PEEP: 5 cmH2O
PEEP: 5 cmH2O
Patient temperature: 97.5
Patient temperature: 36.9
RATE: 16 resp/min
RATE: 16 resp/min
pCO2 arterial: 26.9 mmHg — ABNORMAL LOW (ref 32.0–48.0)
pCO2 arterial: 50.9 mmHg — ABNORMAL HIGH (ref 32.0–48.0)
pH, Arterial: 7.209 — ABNORMAL LOW (ref 7.350–7.450)
pH, Arterial: 7.274 — ABNORMAL LOW (ref 7.350–7.450)
pO2, Arterial: 48.8 mmHg — ABNORMAL LOW (ref 83.0–108.0)
pO2, Arterial: 414 mmHg — ABNORMAL HIGH (ref 83.0–108.0)

## 2020-12-18 LAB — CBC
HCT: 35.3 % — ABNORMAL LOW (ref 36.0–46.0)
Hemoglobin: 11.7 g/dL — ABNORMAL LOW (ref 12.0–15.0)
MCH: 31.7 pg (ref 26.0–34.0)
MCHC: 33.1 g/dL (ref 30.0–36.0)
MCV: 95.7 fL (ref 80.0–100.0)
Platelets: 205 10*3/uL (ref 150–400)
RBC: 3.69 MIL/uL — ABNORMAL LOW (ref 3.87–5.11)
RDW: 13.8 % (ref 11.5–15.5)
WBC: 14 10*3/uL — ABNORMAL HIGH (ref 4.0–10.5)
nRBC: 0.1 % (ref 0.0–0.2)

## 2020-12-18 LAB — BASIC METABOLIC PANEL
Anion gap: 12 (ref 5–15)
BUN: 7 mg/dL (ref 6–20)
CO2: 17 mmol/L — ABNORMAL LOW (ref 22–32)
Calcium: 8.2 mg/dL — ABNORMAL LOW (ref 8.9–10.3)
Chloride: 116 mmol/L — ABNORMAL HIGH (ref 98–111)
Creatinine, Ser: 0.79 mg/dL (ref 0.44–1.00)
GFR, Estimated: >60 mL/min (ref >60)
Glucose, Bld: 103 mg/dL — ABNORMAL HIGH (ref 70–99)
Potassium: 3.2 mmol/L — ABNORMAL LOW (ref 3.5–5.1)
Sodium: 145 mmol/L (ref 135–145)

## 2020-12-18 LAB — GLUCOSE, CAPILLARY
Glucose-Capillary: 101 mg/dL — ABNORMAL HIGH (ref 70–99)
Glucose-Capillary: 100 mg/dL — ABNORMAL HIGH (ref 70–99)
Glucose-Capillary: 71 mg/dL (ref 70–99)
Glucose-Capillary: 84 mg/dL (ref 70–99)
Glucose-Capillary: 97 mg/dL (ref 70–99)
Glucose-Capillary: 101 mg/dL — ABNORMAL HIGH (ref 70–99)

## 2020-12-18 LAB — ECHOCARDIOGRAM LIMITED
Area-P 1/2: 4.21 cm2
Height: 63 in
S' Lateral: 3 cm
Weight: 2447.99 oz

## 2020-12-18 LAB — TRIGLYCERIDES: Triglycerides: 110 mg/dL (ref <150)

## 2020-12-18 LAB — MAGNESIUM: Magnesium: 2.6 mg/dL — ABNORMAL HIGH (ref 1.7–2.4)

## 2020-12-18 LAB — PHOSPHORUS: Phosphorus: 3.1 mg/dL (ref 2.5–4.6)

## 2020-12-18 MED ORDER — VITAL AF 1.2 CAL PO LIQD
1000.0000 mL | ORAL | Status: DC
  Administered 2020-12-18: 1000 mL

## 2020-12-18 MED ORDER — THIAMINE HCL 100 MG PO TABS
100.0000 mg | ORAL_TABLET | Freq: Every day | ORAL | Status: DC
  Filled 2020-12-18: qty 1

## 2020-12-18 MED ORDER — POTASSIUM CHLORIDE 20 MEQ PO PACK
20.0000 meq | PACK | ORAL | Status: AC
  Administered 2020-12-18 (×2): 20 meq
  Filled 2020-12-18 (×2): qty 1

## 2020-12-18 MED ORDER — DEXMEDETOMIDINE HCL IN NACL 400 MCG/100ML IV SOLN
0.4000 ug/kg/h | INTRAVENOUS | Status: DC
  Administered 2020-12-18: 0.4 ug/kg/h via INTRAVENOUS
  Administered 2020-12-18: 0.5 ug/kg/h via INTRAVENOUS
  Administered 2020-12-19 (×2): 1.2 ug/kg/h via INTRAVENOUS
  Administered 2020-12-19: 0.5 ug/kg/h via INTRAVENOUS
  Administered 2020-12-19 – 2020-12-20 (×2): 1.2 ug/kg/h via INTRAVENOUS
  Administered 2020-12-20: 1 ug/kg/h via INTRAVENOUS
  Filled 2020-12-18 (×8): qty 100

## 2020-12-18 MED ORDER — POTASSIUM CHLORIDE 10 MEQ/100ML IV SOLN
10.0000 meq | INTRAVENOUS | Status: AC
  Administered 2020-12-18 (×4): 10 meq via INTRAVENOUS
  Filled 2020-12-18 (×4): qty 100

## 2020-12-18 MED ORDER — DEXTROSE IN LACTATED RINGERS 5 % IV SOLN: INTRAVENOUS | Status: DC

## 2020-12-18 MED ORDER — MIDAZOLAM HCL 2 MG/2ML IJ SOLN
1.0000 mg | INTRAMUSCULAR | Status: DC | PRN
  Administered 2020-12-18: 1 mg via INTRAVENOUS
  Administered 2020-12-19: 2 mg via INTRAVENOUS
  Filled 2020-12-18 (×3): qty 2

## 2020-12-18 MED ORDER — THIAMINE HCL 100 MG/ML IJ SOLN
100.0000 mg | Freq: Every day | INTRAMUSCULAR | Status: DC
  Administered 2020-12-19: 100 mg via INTRAVENOUS
  Filled 2020-12-18: qty 2

## 2020-12-18 MED ORDER — LACTATED RINGERS IV BOLUS
1000.0000 mL | Freq: Once | INTRAVENOUS | Status: AC
  Administered 2020-12-18: 1000 mL via INTRAVENOUS

## 2020-12-18 NOTE — Progress Notes (Signed)
eLink Physician-Brief Progress Note Patient Name: Reiko Vinje DOB: 1970-11-25 MRN: [id number]   Date of Service  12/18/2020  HPI/Events of Note  Patient is on 100 % FIO2 on the ventilator.  eICU Interventions  Will check an ABG preparatory to weaning FIO2.        Thomasene Lot Faye Sanfilippo 12/18/2020, 4:03 AM

## 2020-12-18 NOTE — H&P (Signed)
NAME:  Margaret Mccann, MRN:  [id number], DOB:  02/01/1971, LOS: 1 ADMISSION DATE:  12/17/2020, CONSULTATION DATE:  12/17/2020 REFERRING MD:  ED physician, CHIEF COMPLAINT:  Admitted with altered mentation  BRIEF  Patient with a history of bipolar disorder, alcohol abuse, PTSD Have been out drinking Came in about 4 AM according to mother intoxicated Slept all day Patient was found by mother lethargic, she had vomited, altered mentation EMS activated Had a seizure on route to the hospital  Pertinent  Medical History   Past Medical History:  Diagnosis Date  . Anxiety   . Bipolar 1 disorder (HCC)   . Depression   . Morgellons syndrome    Significant Hospital Events: Including procedures, antibiotic start and stop dates in addition to other pertinent events   . 3/25 intubated  Interim History / Subjective:   3/26 -    U tox positive for benzo and cocaine. ETOH < 10 at admit. Down to 40% fio2 on vent. Sedated with diprivan gtt (dropping bp) and fent gtt. On sedation wean -> nystagmus. Did follow command x 1 for RN  Objective   Blood pressure (!) 94/57, pulse 70, temperature (!) 97.52 F (36.4 C), resp. rate 20, height 5\' 3"  (1.6 m), weight 69.4 kg, SpO2 100 %.    Vent Mode: PRVC FiO2 (%):  [50 %-100 %] 50 % Set Rate:  [16 bmp-20 bmp] 20 bmp Vt Set:  [420 mL] 420 mL PEEP:  [5 cmH20] 5 cmH20 Plateau Pressure:  [13 cmH20-19 cmH20] 15 cmH20   Intake/Output Summary (Last 24 hours) at 12/18/2020 1009 Last data filed at 12/18/2020 0932 Gross per 24 hour  Intake 1712.05 ml  Output 650 ml  Net 1062.05 ml   Filed Weights   12/17/20 1957 12/17/20 2132 12/18/20 0425  Weight: 71 kg 69.4 kg 69.4 kg    Examination: General Appearance:  Looks criticall ill Head:  Normocephalic, without obvious abnormality, atraumatic Eyes:  PERRL - yes, conjunctiva/corneas - muddy. Nystagmus     Ears:  Normal external ear canals, both ears Nose:  G tube - no Throat:  ETT TUBE - yes , OG  tube - yes Neck:  Supple,  No enlargement/tenderness/nodules Lungs: Clear to auscultation bilaterally, Ventilator   Synchrony - yes Heart:  S1 and S2 normal, no murmur, CVP - x.  Pressors - no Abdomen:  Soft, no masses, no organomegaly Genitalia / Rectal:  Not done Extremities:  Extremities- intact Skin:  ntact in exposed areas . Sacral area - not examined Neurologic:  Sedation - fent gtt (just off diprivn gtt) -> RASS - -3 . Moves all 4s - yes. CAM-ICU - cannot test . Orientation - not orieted      Labs/imaging that I havepersonally reviewed  (right click and "Reselect all SmartList Selections" daily)  Chest x-ray reviewed showing multifocal infiltrate, right upper lobe, retrocardiac, right lower lobe  Chem-7 significant for hypokalemia, elevated creatinine  Blood cultures drawn x2  Resolved Hospital Problem list     Assessment & Plan:  ASSESSMENT / PLAN:  PULMONARY  A:  Acute resp failure following acute encephalopathy  12/18/2020 -> does not meet SBT criteria due to sedation neeed  P:   PRVC VAP bundle   NEUROLOGIC A:   Alcoholisum with bipolar - has been to rehab Seiziures at admit 12/17/2020 and acute encephalopathy - known etoh but U tox with benzo/cociane  Possible nystagumus 11/28/20  P:   Fent gtt Change diprivan to precedex gtt Versed prn  Get eeg Hold off anti epiletptic for now Thiamine and folate    VASCULAR A:   Soft bp on diprivan  P:  Hold diprivan MAP goal > 65 Fluid bolus  CARDIAC STRUCTURAL A: 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  P: echo  CARDIAC ELECTRICAL A: sinus   P: tele  INFECTIOUS A:   Aspiration pneumonia P:   Unasyn 3/25 - 3/31   RENAL A:  AKI at admit  Acidosis - metab and resp  12/18/2020 - resolved AKI  P:  Fluids and monitor Monitor ABG  ELECTROLYTES A:  At risk electrolyte imbalance P: monitor   GASTROINTESTINAL A:   Fatty liver on CT at admit Transaminitis - resolved NPO  P:    Start TF  HEMATOLOGIC   - HEME A:  Anemia of critical illness   P:  - PRBC for hgb </= 6.9gm%    - exceptions are   -  if ACS susepcted/confirmed then transfuse for hgb </= 8.0gm%,  or    -  active bleeding with hemodynamic instability, then transfuse regardless of hemoglobin value   At at all times try to transfuse 1 unit prbc as possible with exception of active hemorrhage   HEMATOLOGIC - Platelets A AT risk thrombocytopenia  P Monitor with lovenox  ENDOCRINE A:   At risk hypo an dhyperglycemia   P:   ssi   Will hold home medications at present   Best practice (right click and "Reselect all SmartList Selections" daily)  Diet:  Tube Feed  Pain/Anxiety/Delirium protocol (if indicated): Yes (RASS goal -1) VAP protocol (if indicated): Yes DVT prophylaxis: LMWH GI prophylaxis: H2B Glucose control:  SSI Yes Central venous access:  N/A Arterial line:  N/A Foley:  Yes, and it is still needed Mobility:  bed rest  PT consulted: N/A Last date of multidisciplinary goals of care discussion [pending] Code Status:  full code Disposition: ICU    ATTESTATION & SIGNATURE   The patient Margaret Mccann is critically ill with multiple organ systems failure and requires high complexity decision making for assessment and support, frequent evaluation and titration of therapies, application of advanced monitoring technologies and extensive interpretation of multiple databases.   Critical Care Time devoted to patient care services described in this note is  45  Minutes. This time reflects time of care of this signee Dr Kalman Shan. This critical care time does not reflect procedure time, or teaching time or supervisory time of PA/NP/Med student/Med Resident etc but could involve care discussion time     Dr. Kalman Shan, M.D., [facility].C.P Pulmonary and Critical Care Medicine Staff Physician [facility]  Pulmonary and Critical Care Pager: [phone], If no answer or between  15:00h - 7:00h: call [phone]  12/18/2020 10:10 AM   LABS    PULMONARY Recent Labs  Lab 12/17/20 1936 12/18/20 0430  PHART 7.209* 7.274*  PCO2ART 26.9* 50.9*  PO2ART 48.8* 414*  HCO3 10.5* 22.9  O2SAT 76.5 99.8    CBC Recent Labs  Lab 12/17/20 1553 12/17/20 2030 12/18/20 0301  HGB 13.8 11.2* 11.7*  HCT 43.2 33.7* 35.3*  WBC 27.6* 15.7* 14.0*  PLT 249 173 205    COAGULATION No results for input(s): INR in the last 168 hours.  CARDIAC  No results for input(s): TROPONINI in the last 168 hours. No results for input(s): PROBNP in the last 168 hours.   CHEMISTRY Recent Labs  Lab 12/17/20 1708 12/17/20 2030 12/18/20 0222  NA 141  --  145  K 3.4*  --  3.2*  CL 111  --  116*  CO2 10*  --  17*  GLUCOSE 198*  --  103*  BUN 11  --  7  CREATININE 1.13* 0.81 0.79  CALCIUM 8.4*  --  8.2*  MG  --  2.1 2.6*  PHOS  --  2.6 3.1   Estimated Creatinine Clearance: 81.2 mL/min (by C-G formula based on SCr of 0.79 mg/dL).   LIVER Recent Labs  Lab 12/17/20 1708  AST 44*  ALT 39  ALKPHOS 98  BILITOT 0.9  PROT 7.0  ALBUMIN 3.4*     INFECTIOUS No results for input(s): LATICACIDVEN, PROCALCITON in the last 168 hours.   ENDOCRINE CBG (last 3)  Recent Labs    12/17/20 2351 12/18/20 0435 12/18/20 0743  GLUCAP 104* 101* 100*         IMAGING x48h  - image(s) personally visualized  -   highlighted in bold DG Abd 1 View  Result Date: 12/17/2020 CLINICAL DATA:  OG tube placement. EXAM: ABDOMEN - 1 VIEW COMPARISON:  Radiograph earlier this day. FINDINGS: Enteric tube is been retracted, the tip and side port remain below the diaphragm in the stomach. There is mild gaseous gastric distension. No small bowel dilatation in the upper abdomen. IMPRESSION: Enteric tube has been retracted, tip and side-port remain below the diaphragm in the stomach. Mild gaseous gastric distension. Electronically Signed   By: Narda Rutherford M.D.    On: 12/17/2020 22:07   CT Head Wo Contrast  Result Date: 12/17/2020 CLINICAL DATA:  Altered mental status.  Possible overdose. EXAM: CT HEAD WITHOUT CONTRAST TECHNIQUE: Contiguous axial images were obtained from the base of the skull through the vertex without intravenous contrast. COMPARISON:  Jan 29, 2020. FINDINGS: Brain: No evidence of acute infarction, hemorrhage, hydrocephalus, extra-axial collection or mass lesion/mass effect. Vascular: No hyperdense vessel or unexpected calcification. Skull: Normal. Negative for fracture or focal lesion. Sinuses/Orbits: No acute finding. Other: None. IMPRESSION: Normal head CT. Electronically Signed   By: Lupita Raider M.D.   On: 12/17/2020 16:53   DG Chest Port 1 View  Result Date: 12/18/2020 CLINICAL DATA:  Pneumonia EXAM: PORTABLE CHEST 1 VIEW COMPARISON:  Yesterday FINDINGS: Endotracheal tube with tip halfway between the clavicular heads and carina. The enteric tube reaches the stomach. Generous heart size attributed to technique. Extensive artifact from EKG leads. Lower lung volumes. Right more than left airspace disease with worsening aeration. No visible effusion or pneumothorax. IMPRESSION: 1. Stable hardware positioning. 2. Bilateral pneumonia.  Aeration has worsened since yesterday. Electronically Signed   By: Marnee Spring M.D.   On: 12/18/2020 05:05   DG Chest Portable 1 View  Result Date: 12/17/2020 CLINICAL DATA:  Intubation and OG tube placement EXAM: PORTABLE CHEST 1 VIEW COMPARISON:  Earlier same day FINDINGS: Endotracheal tube is approximately 2.5 cm above the carina. Enteric tube passes into the stomach. Patchy density in the right lung, primarily in the upper lobe. Probable retrocardiac density. No pleural effusion. No pneumothorax. Stable cardiomediastinal contours. IMPRESSION: 1. Endotracheal tube and enteric tube as above. 2. Patchy bilateral pulmonary opacities, similar to prior. Electronically Signed   By: Guadlupe Spanish M.D.   On:  12/17/2020 20:37   DG Chest Port 1 View  Result Date: 12/17/2020 CLINICAL DATA:  Post seizure.  Possible aspiration. EXAM: PORTABLE CHEST 1 VIEW COMPARISON:  Chest radiograph 12/01/2020, CT 12/02/2020 FINDINGS: Airspace consolidation throughout the right upper lobe,  lesser patchy opacity at the right lung base. There is also likely retrocardiac opacity. Heart is normal in size with unchanged mediastinal contours. The patient is rotated. No pneumothorax or significant pleural effusion. No acute osseous abnormalities are seen. IMPRESSION: Multifocal airspace consolidation, right greater than left, most consistent with multifocal pneumonia. Given provided history, aspiration is suspected. Electronically Signed   By: Narda Rutherford M.D.   On: 12/17/2020 17:09   DG Abd Portable 1 View  Result Date: 12/17/2020 CLINICAL DATA:  Check gastric catheter placement EXAM: PORTABLE ABDOMEN - 1 VIEW COMPARISON:  None. FINDINGS: Gastric catheter is noted within the stomach. Scattered large and small bowel gas is noted. No free air is seen. IMPRESSION: Gastric catheter within the stomach. Electronically Signed   By: Alcide Clever M.D.   On: 12/17/2020 20:36

## 2020-12-18 NOTE — Progress Notes (Addendum)
Initial Nutrition Assessment  DOCUMENTATION CODES:   Not applicable  INTERVENTION:   Initiate tube feeding via OG tube: Vital AF 1.2 at 55 ml/h (1320 ml per day)  Provides 1584 kcal, 99 gm protein, 1071 ml free water daily  Continue liquid MVI daily  NUTRITION DIAGNOSIS:   Inadequate oral intake related to inability to eat as evidenced by NPO status.  GOAL:   Patient will meet greater than or equal to 90% of their needs  MONITOR:   Vent status,TF tolerance,Labs  REASON FOR ASSESSMENT:   Ventilator,Consult Enteral/tube feeding initiation and management  ASSESSMENT:   50 yo female admitted with AMS requiring intubation. PMH includes bipolar disorder, alcohol abuse, PTSD, Morgellons syndrome.   UDS positive for benzos and cocaine. Received MD Consult for TF initiation and management. OG tube in place.  Patient is currently intubated on ventilator support MV: 9.1 L/min Temp (24hrs), Avg:98.3 F (36.8 C), Min:97.52 F (36.4 C), Max:99.14 F (37.3 C)   Labs reviewed. K 3.2, mag 2.6 CBG: 101-100-71  Medications reviewed and include Novolog, liquid MVI, thiamine.  Weight history reviewed. Weight stable for the past year.    NUTRITION - FOCUSED PHYSICAL EXAM:  unable to complete  Diet Order:   Diet Order    None      EDUCATION NEEDS:   Not appropriate for education at this time  Skin:  Skin Assessment: Reviewed RN Assessment  Last BM:  no BM documented  Height:   Ht Readings from Last 1 Encounters:  12/17/20 5\' 3"  (1.6 m)    Weight:   Wt Readings from Last 1 Encounters:  12/18/20 69.4 kg    Ideal Body Weight:  52.3 kg  BMI:  Body mass index is 27.1 kg/m.  Estimated Nutritional Needs:   Kcal:  1550  Protein:  95-110 gm  Fluid:  >/= 1.6 L    12/20/20, RD, LDN, CNSC Please refer to Amion for contact information.

## 2020-12-18 NOTE — Progress Notes (Signed)
  Echocardiogram 2D Echocardiogram has been performed.  Delcie Roch 12/18/2020, 3:20 PM

## 2020-12-18 NOTE — Progress Notes (Signed)
K+ 3.2 Replaced per protocol  

## 2020-12-19 ENCOUNTER — Inpatient Hospital Stay (HOSPITAL_COMMUNITY): Payer: Self-pay

## 2020-12-19 DIAGNOSIS — J9602 Acute respiratory failure with hypercapnia: Secondary | ICD-10-CM | POA: Diagnosis present

## 2020-12-19 DIAGNOSIS — G9340 Encephalopathy, unspecified: Secondary | ICD-10-CM

## 2020-12-19 DIAGNOSIS — E872 Acidosis: Secondary | ICD-10-CM

## 2020-12-19 DIAGNOSIS — F1410 Cocaine abuse, uncomplicated: Secondary | ICD-10-CM

## 2020-12-19 DIAGNOSIS — F1310 Sedative, hypnotic or anxiolytic abuse, uncomplicated: Secondary | ICD-10-CM | POA: Diagnosis present

## 2020-12-19 LAB — CBC
HCT: 36 % (ref 36.0–46.0)
Hemoglobin: 11.5 g/dL — ABNORMAL LOW (ref 12.0–15.0)
MCH: 31.5 pg (ref 26.0–34.0)
MCHC: 31.9 g/dL (ref 30.0–36.0)
MCV: 98.6 fL (ref 80.0–100.0)
Platelets: 202 10*3/uL (ref 150–400)
RBC: 3.65 MIL/uL — ABNORMAL LOW (ref 3.87–5.11)
RDW: 14.4 % (ref 11.5–15.5)
WBC: 10.5 10*3/uL (ref 4.0–10.5)
nRBC: 0 % (ref 0.0–0.2)

## 2020-12-19 LAB — HEMOGLOBIN A1C
Hgb A1c MFr Bld: 5.6 % (ref 4.8–5.6)
Mean Plasma Glucose: 114.02 mg/dL

## 2020-12-19 LAB — COMPREHENSIVE METABOLIC PANEL
ALT: 22 U/L (ref 0–44)
AST: 22 U/L (ref 15–41)
Albumin: 2.9 g/dL — ABNORMAL LOW (ref 3.5–5.0)
Alkaline Phosphatase: 72 U/L (ref 38–126)
Anion gap: 6 (ref 5–15)
BUN: <5 mg/dL — ABNORMAL LOW (ref 6–20)
CO2: 24 mmol/L (ref 22–32)
Calcium: 8.6 mg/dL — ABNORMAL LOW (ref 8.9–10.3)
Chloride: 115 mmol/L — ABNORMAL HIGH (ref 98–111)
Creatinine, Ser: 0.79 mg/dL (ref 0.44–1.00)
GFR, Estimated: >60 mL/min (ref >60)
Glucose, Bld: 112 mg/dL — ABNORMAL HIGH (ref 70–99)
Potassium: 3.3 mmol/L — ABNORMAL LOW (ref 3.5–5.1)
Sodium: 145 mmol/L (ref 135–145)
Total Bilirubin: 0.6 mg/dL (ref 0.3–1.2)
Total Protein: 6.4 g/dL — ABNORMAL LOW (ref 6.5–8.1)

## 2020-12-19 LAB — PROTIME-INR
INR: 1 (ref 0.8–1.2)
Prothrombin Time: 12.4 seconds (ref 11.4–15.2)

## 2020-12-19 LAB — GLUCOSE, CAPILLARY
Glucose-Capillary: 114 mg/dL — ABNORMAL HIGH (ref 70–99)
Glucose-Capillary: 111 mg/dL — ABNORMAL HIGH (ref 70–99)
Glucose-Capillary: 95 mg/dL (ref 70–99)
Glucose-Capillary: 95 mg/dL (ref 70–99)
Glucose-Capillary: 90 mg/dL (ref 70–99)
Glucose-Capillary: 76 mg/dL (ref 70–99)

## 2020-12-19 LAB — PREGNANCY, URINE: Preg Test, Ur: NEGATIVE

## 2020-12-19 LAB — LACTIC ACID, PLASMA: Lactic Acid, Venous: 1.2 mmol/L (ref 0.5–1.9)

## 2020-12-19 MED ORDER — LORAZEPAM 1 MG PO TABS
1.0000 mg | ORAL_TABLET | Freq: Four times a day (QID) | ORAL | Status: AC
  Administered 2020-12-19 (×2): 1 mg via ORAL
  Filled 2020-12-19 (×2): qty 1

## 2020-12-19 MED ORDER — HYDROXYZINE HCL 25 MG PO TABS: 25.0000 mg | ORAL_TABLET | Freq: Four times a day (QID) | ORAL | Status: DC | PRN

## 2020-12-19 MED ORDER — LOPERAMIDE HCL 2 MG PO CAPS
2.0000 mg | ORAL_CAPSULE | ORAL | Status: AC | PRN
Start: 2020-12-19 — End: 2020-12-24
  Administered 2020-12-22: 4 mg via ORAL
  Administered 2020-12-23: 2 mg via ORAL
  Filled 2020-12-19: qty 2

## 2020-12-19 MED ORDER — FENTANYL CITRATE (PF) 100 MCG/2ML IJ SOLN
25.0000 ug | Freq: Once | INTRAMUSCULAR | Status: AC
Start: 2020-12-19 — End: 2020-12-19
  Administered 2020-12-19: 25 ug via INTRAVENOUS

## 2020-12-19 MED ORDER — CLONIDINE HCL 0.1 MG PO TABS
0.1000 mg | ORAL_TABLET | Freq: Every day | ORAL | Status: AC
  Administered 2020-12-23 – 2020-12-24 (×2): 0.1 mg via ORAL
  Filled 2020-12-19 (×2): qty 1

## 2020-12-19 MED ORDER — CLONIDINE HCL 0.1 MG PO TABS
0.1000 mg | ORAL_TABLET | Freq: Four times a day (QID) | ORAL | Status: AC
  Administered 2020-12-19 – 2020-12-20 (×6): 0.1 mg via ORAL
  Filled 2020-12-19 (×6): qty 1

## 2020-12-19 MED ORDER — METHOCARBAMOL 500 MG PO TABS: 500.0000 mg | ORAL_TABLET | Freq: Three times a day (TID) | ORAL | Status: DC | PRN

## 2020-12-19 MED ORDER — LORAZEPAM 1 MG PO TABS: 1.0000 mg | ORAL_TABLET | Freq: Four times a day (QID) | ORAL | Status: DC

## 2020-12-19 MED ORDER — ONDANSETRON 4 MG PO TBDP: 4.0000 mg | ORAL_TABLET | Freq: Four times a day (QID) | ORAL | Status: AC | PRN

## 2020-12-19 MED ORDER — FOLIC ACID 5 MG/ML IJ SOLN
1.0000 mg | Freq: Every day | INTRAMUSCULAR | Status: DC
  Administered 2020-12-19 – 2020-12-21 (×3): 1 mg via INTRAVENOUS
  Filled 2020-12-19 (×4): qty 0.2

## 2020-12-19 MED ORDER — FENTANYL CITRATE (PF) 100 MCG/2ML IJ SOLN
25.0000 ug | INTRAMUSCULAR | Status: DC | PRN
  Administered 2020-12-19 – 2020-12-20 (×3): 100 ug via INTRAVENOUS
  Filled 2020-12-19 (×3): qty 2

## 2020-12-19 MED ORDER — LORAZEPAM 1 MG PO TABS
1.0000 mg | ORAL_TABLET | Freq: Four times a day (QID) | ORAL | Status: DC
  Administered 2020-12-19 (×2): 1 mg
  Filled 2020-12-19: qty 1

## 2020-12-19 MED ORDER — POTASSIUM CHLORIDE 10 MEQ/100ML IV SOLN
10.0000 meq | INTRAVENOUS | Status: AC
  Administered 2020-12-19 (×4): 10 meq via INTRAVENOUS
  Filled 2020-12-19 (×4): qty 100

## 2020-12-19 MED ORDER — POTASSIUM CHLORIDE 20 MEQ PO PACK
40.0000 meq | PACK | Freq: Once | ORAL | Status: AC
  Administered 2020-12-19: 40 meq
  Filled 2020-12-19: qty 2

## 2020-12-19 MED ORDER — CLONIDINE HCL 0.1 MG PO TABS: 0.1000 mg | ORAL_TABLET | ORAL | Status: DC

## 2020-12-19 MED ORDER — LORAZEPAM 1 MG PO TABS: 1.0000 mg | ORAL_TABLET | Freq: Three times a day (TID) | ORAL | Status: DC

## 2020-12-19 MED ORDER — DICYCLOMINE HCL 20 MG PO TABS
20.0000 mg | ORAL_TABLET | Freq: Four times a day (QID) | ORAL | Status: DC | PRN
  Filled 2020-12-19: qty 1

## 2020-12-19 MED ORDER — HALOPERIDOL LACTATE 5 MG/ML IJ SOLN
5.0000 mg | INTRAMUSCULAR | Status: AC
  Administered 2020-12-19: 5 mg via INTRAVENOUS
  Filled 2020-12-19: qty 1

## 2020-12-19 MED ORDER — LORAZEPAM 1 MG PO TABS: 1.0000 mg | ORAL_TABLET | Freq: Two times a day (BID) | ORAL | Status: DC

## 2020-12-19 MED ORDER — NAPROXEN 250 MG PO TABS
500.0000 mg | ORAL_TABLET | Freq: Two times a day (BID) | ORAL | Status: DC | PRN
Start: 2020-12-19 — End: 2020-12-19

## 2020-12-19 MED ORDER — LORAZEPAM 1 MG PO TABS: 1.0000 mg | ORAL_TABLET | Freq: Every day | ORAL | Status: DC

## 2020-12-19 MED ORDER — LOPERAMIDE HCL 2 MG PO CAPS: 2.0000 mg | ORAL_CAPSULE | ORAL | Status: DC | PRN

## 2020-12-19 MED ORDER — FENTANYL CITRATE (PF) 100 MCG/2ML IJ SOLN
INTRAMUSCULAR | Status: AC
  Filled 2020-12-19: qty 2

## 2020-12-19 MED ORDER — HALOPERIDOL LACTATE 5 MG/ML IJ SOLN
5.0000 mg | Freq: Four times a day (QID) | INTRAMUSCULAR | Status: DC | PRN
  Administered 2020-12-19: 5 mg via INTRAVENOUS
  Filled 2020-12-19: qty 1

## 2020-12-19 MED ORDER — CLONIDINE HCL 0.1 MG PO TABS: 0.1000 mg | ORAL_TABLET | Freq: Every day | ORAL | Status: DC

## 2020-12-19 MED ORDER — CLONIDINE HCL 0.1 MG PO TABS: 0.1000 mg | ORAL_TABLET | Freq: Four times a day (QID) | ORAL | Status: DC

## 2020-12-19 MED ORDER — DOCUSATE SODIUM 100 MG PO CAPS: 100.0000 mg | ORAL_CAPSULE | Freq: Two times a day (BID) | ORAL | Status: DC | PRN

## 2020-12-19 MED ORDER — POLYETHYLENE GLYCOL 3350 17 G PO PACK: 17.0000 g | PACK | Freq: Every day | ORAL | Status: DC | PRN

## 2020-12-19 MED ORDER — THIAMINE HCL 100 MG PO TABS: 100.0000 mg | ORAL_TABLET | Freq: Every day | ORAL | Status: DC

## 2020-12-19 MED ORDER — ADULT MULTIVITAMIN W/MINERALS CH
1.0000 | ORAL_TABLET | Freq: Every day | ORAL | Status: DC
Start: 2020-12-19 — End: 2020-12-19

## 2020-12-19 MED ORDER — LORAZEPAM 1 MG PO TABS
1.0000 mg | ORAL_TABLET | Freq: Three times a day (TID) | ORAL | Status: DC
  Filled 2020-12-19: qty 1

## 2020-12-19 MED ORDER — DICYCLOMINE HCL 20 MG PO TABS
20.0000 mg | ORAL_TABLET | Freq: Four times a day (QID) | ORAL | Status: AC | PRN
  Filled 2020-12-19: qty 1

## 2020-12-19 MED ORDER — ONDANSETRON 4 MG PO TBDP: 4.0000 mg | ORAL_TABLET | Freq: Four times a day (QID) | ORAL | Status: DC | PRN

## 2020-12-19 MED ORDER — HYDROXYZINE HCL 25 MG PO TABS
25.0000 mg | ORAL_TABLET | Freq: Four times a day (QID) | ORAL | Status: DC | PRN
  Administered 2020-12-20 – 2020-12-21 (×2): 25 mg via ORAL
  Filled 2020-12-19 (×2): qty 1

## 2020-12-19 MED ORDER — THIAMINE HCL 100 MG PO TABS
100.0000 mg | ORAL_TABLET | Freq: Every day | ORAL | Status: DC
  Administered 2020-12-20 – 2020-12-24 (×5): 100 mg via ORAL
  Filled 2020-12-19 (×5): qty 1

## 2020-12-19 MED ORDER — CLONIDINE HCL 0.1 MG PO TABS
0.1000 mg | ORAL_TABLET | ORAL | Status: AC
  Administered 2020-12-21 – 2020-12-22 (×4): 0.1 mg via ORAL
  Filled 2020-12-19 (×4): qty 1

## 2020-12-19 MED ORDER — ADULT MULTIVITAMIN W/MINERALS CH
1.0000 | ORAL_TABLET | Freq: Every day | ORAL | Status: DC
  Administered 2020-12-20 – 2020-12-24 (×5): 1 via ORAL
  Filled 2020-12-19 (×5): qty 1

## 2020-12-19 MED ORDER — THIAMINE HCL 100 MG/ML IJ SOLN: 100.0000 mg | Freq: Once | INTRAMUSCULAR | Status: DC

## 2020-12-19 MED ORDER — CLONIDINE HCL 0.1 MG PO TABS
0.1000 mg | ORAL_TABLET | Freq: Four times a day (QID) | ORAL | Status: DC
  Administered 2020-12-19 (×2): 0.1 mg
  Filled 2020-12-19 (×2): qty 1

## 2020-12-19 MED ORDER — LORAZEPAM 1 MG PO TABS: 1.0000 mg | ORAL_TABLET | Freq: Four times a day (QID) | ORAL | Status: DC | PRN

## 2020-12-19 MED ORDER — POTASSIUM CHLORIDE 20 MEQ PO PACK
20.0000 meq | PACK | ORAL | Status: AC
  Administered 2020-12-19 (×2): 20 meq
  Filled 2020-12-19 (×2): qty 1

## 2020-12-19 MED ORDER — THIAMINE HCL 100 MG/ML IJ SOLN
100.0000 mg | Freq: Every day | INTRAMUSCULAR | Status: DC
  Filled 2020-12-19: qty 2

## 2020-12-19 MED ORDER — HYDRALAZINE HCL 20 MG/ML IJ SOLN
10.0000 mg | Freq: Four times a day (QID) | INTRAMUSCULAR | Status: DC | PRN
  Administered 2020-12-19: 10 mg via INTRAVENOUS
  Filled 2020-12-19: qty 1

## 2020-12-19 NOTE — Progress Notes (Signed)
eLink Physician-Brief Progress Note Patient Name: Malay Fantroy DOB: April 29, 1971 MRN: [id number]   Date of Service  12/19/2020  HPI/Events of Note  Asking to renew restraints  Camera eval done.   eICU Interventions  Restraints re ordered to prevent self injury  And harm while on Ventilator with agitation.      Intervention Category Minor Interventions: Agitation / anxiety - evaluation and management  Ranee Gosselin 12/19/2020, 6:13 AM

## 2020-12-19 NOTE — Procedures (Signed)
Extubation Procedure Note  Patient Details:   Name: Margaret Mccann DOB: 10-01-1970 MRN: [id number]   Airway Documentation:    Vent end date: 12/19/20 Vent end time: 0900   Evaluation  O2 sats: 94 Complications: Pt self extubated, copious secretions Patient tolerated procedure well. Bilateral Breath Sounds: Diminished   Able to speak  Revonda Humphrey 12/19/2020, 9:13 AM

## 2020-12-19 NOTE — Progress Notes (Addendum)
NAME:  Margaret Mccann, MRN:  [id number], DOB:  [birth year], LOS: 2 ADMISSION DATE:  12/17/2020, CONSULTATION DATE:  12/17/2020 REFERRING MD:  ED physician, CHIEF COMPLAINT:  Admitted with altered mentation  BRIEF  Patient with a history of bipolar disorder, alcohol abuse, cocaine Nov 2021. PTSD Have been out drinking Came in about 4 AM according to mother intoxicated Slept all day Patient was found by mother lethargic, she had vomited, altered mentation EMS activated Had a seizure on route to the hospital  Pertinent  Medical History   Past Medical History:  Diagnosis Date  . Anxiety   . Bipolar 1 disorder (HCC)   . Depression   . Morgellons syndrome    Per Mom - attended [employer] and graduated with 4.0 GPA. Says had a good childhood. Has 2 children. Says there is addiction in her fathers side of family and suffers from bipolar. Started etoh behavior after birth of 2nd child. Son is > 18 and is in [place] and daughter is 47. They live with patient ex husband/their father. Mom is DPOA  Significant Hospital Events: Including procedures, antibiotic start and stop dates in addition to other pertinent events   3/25 intubated  3/26 -    U tox positive for benzo and cocaine. ETOH < 10 at admit. Down to 40% fio2 on vent. Sedated with diprivan gtt (dropping bp) and fent gtt. On sedation wean -> nystagmus. Did follow command x 1 for RN  Interim History / Subjective:    12/19/2020 -T-max 101 Fahrenheit yesterday but currently afebrile with normal white count.  On Unasyn culture negative.  Of fentanyl infusion on Precedex infusion quite awake and asking for extubation although restless as well.  Spontaneous breathing trial pending. Per Mom thought Alcohol level was normal here - mom says she was intoxicated at 4am 12/17/20  prior to mom sending her to ER. She suspects that normal lleve here was a washout.   Objective   Blood pressure 107/77, pulse 60, temperature 99.5 F (37.5 C),  temperature source Bladder, resp. rate 20, height 5\' 3"  (1.6 m), weight 76.7 kg, SpO2 96 %.    Vent Mode: PRVC FiO2 (%):  [30 %-40 %] 30 % Set Rate:  [20 bmp] 20 bmp Vt Set:  [420 mL] 420 mL PEEP:  [5 cmH20] 5 cmH20 Plateau Pressure:  [11 cmH20-21 cmH20] 19 cmH20   Intake/Output Summary (Last 24 hours) at 12/19/2020 0840 Last data filed at 12/19/2020 0700 Gross per 24 hour  Intake 4384.39 ml  Output 700 ml  Net 3684.39 ml   Filed Weights   12/17/20 2132 12/18/20 0425 12/19/20 0500  Weight: 69.4 kg 69.4 kg 76.7 kg    Examination: General Appearance:  Looks better on the ventilator Head:  Normocephalic, without obvious abnormality, atraumatic Eyes:  PERRL - yes, conjunctiva/corneas - clear.  No nystagmus     Ears:  Normal external ear canals, both ears Nose:  G tube - no Throat:  ETT TUBE - yes , OG tube - yes Neck:  Supple,  No enlargement/tenderness/nodules Lungs: Clear to auscultation bilaterally, Ventilator   Synchrony - yes  Heart:  S1 and S2 normal, no murmur, CVP - no.  Pressors - no Abdomen:  Soft, no masses, no organomegaly Genitalia / Rectal:  Not done Extremities:  Extremities-intact Skin:  ntact in exposed areas . Sacral area -not examined Neurologic:  Sedation -Precedex infusion-> RASS -+2. Moves all 4s -yes. CAM-ICU -negative. Orientation - x3+    Labs/imaging that  I havepersonally reviewed  (right click and "Reselect all SmartList Selections" daily)  Chest x-ray reviewed showing multifocal infiltrate, right upper lobe, retrocardiac, right lower lobe  Chem-7 significant for hypokalemia, elevated creatinine  Blood cultures drawn x2   ICD 10 probl list     Present on Admission Present on Admission: . Acute respiratory failure with hypoxemia (HCC) . Cocaine abuse (HCC) . Benzodiazepine abuse (HCC) . Encephalopathy acute . AKI (acute kidney injury) (HCC) . Acute respiratory acidosis . Aspiration pneumonia (HCC)   Active Problems Principal  Problem:   Acute respiratory failure with hypoxemia (HCC) Active Problems:   Cocaine abuse (HCC)   Seizure (HCC)   Benzodiazepine abuse (HCC)   Acute respiratory acidosis   Aspiration pneumonia (HCC)   AKI (acute kidney injury) (HCC)   Tricuspid valve regurgitation   Encephalopathy acute   Comprehensive Problem List Patient Active Problem List   Diagnosis Date Noted  . Benzodiazepine abuse (HCC) 12/19/2020    Priority: High  . Acute respiratory acidosis 12/19/2020    Priority: High    Class: Acute  . Acute respiratory failure with hypoxemia (HCC) 12/17/2020    Priority: High  . Aspiration pneumonia (HCC) 12/17/2020    Priority: High    Class: Acute  . Seizure (HCC) 06/02/2018    Priority: High  . Cocaine abuse (HCC) 06/03/2012    Priority: High  . Tricuspid valve regurgitation 12/18/2020    Priority: Medium  . AKI (acute kidney injury) (HCC) 12/17/2020    Priority: Medium  . Encephalopathy acute 12/19/2020  . Major depressive disorder, recurrent episode with anxious distress (HCC) 08/11/2020  . Intentional drug overdose (HCC) 08/04/2020  . Grief 07/30/2020  . PTSD (post-traumatic stress disorder) 07/30/2020  . Major depressive disorder, recurrent severe without psychotic features (HCC) 06/04/2018  . Suicide attempt (HCC)   . 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 Amphetamine and psychostimulant-induced psychosis with hallucinations (HCC) 06/03/2012        Resolved Hospital Problem list     Assessment & Plan:  ASSESSMENT / PLAN:  Polysubstance abuse - cocaine and benzo at admit 12/17/2020 . Unclear intent (known etoh and prior amphetamine)  Plan  - clarify intent upon extubation     Acute resp failure following acute encephalopathy  12/19/2020 -> improving encephalopathy.  On Precedex.  Meets criteria for spontaneous breathing trial.  P:   Spontaneous breathing trial on Precedex and if does well extubate    Alcoholisum with bipolar - has been to  rehab Prior suicide attempts - known depression and PTSD Prior x 8 admits for [facility] admits - works with [facility] MD Seiziures at admit 12/17/2020 and acute encephalopathy - known etoh but U tox with benzo/cociane  Possible nystagumus 12/18/20 -no nystagmus 12/19/2020  12/19/2020: Intermittent agitation although oriented more  P:   Versed prn Continue Precedex and aim to extubate on Precedex Get eeg -pending [ordered for nystagmus 12/18/2020] Hold off anti epiletptic for now Thiamine and folate Restraints needed Georgia Duff needs [facility] consult    Soft bp on diprivan on 12/18/2020.  Resolved after fluid bolus.  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 -echocardiogram 12/18/2020 with severe tricuspid valve regurgitation  P: Needs cardiology follow-up as outpatient  Aspiration pneumonia at admit 12/18/2020  -12/19/2020: Afebrile and responding to Unasyn  P:   Unasyn 3/25 - 3/31    AKI at admit  -resolved 12/18/2020  Acidosis - resp -clinically resolved 12/19/2020   P:  Clinically monitor   At risk electrolyte imbalance   -12/19/2020: Hypokalemia  P: Replete potassium [potassium goal is greater than 4] monitor    Fatty liver on CT at admit Transaminitis - resolved NPO  P:   Stop tube feeds for potential extubation    Anemia of critical illness  12/19/2020: Hemoglobin 9.5 g% and no bleeding   P:  - PRBC for hgb </= 6.9gm%    - exceptions are   -  if ACS susepcted/confirmed then transfuse for hgb </= 8.0gm%,  or    -  active bleeding with hemodynamic instability, then transfuse regardless of hemoglobin value   At at all times try to transfuse 1 unit prbc as possible with exception of active hemorrhage    AT risk thrombocytopenia  P Monitor with lovenox     At risk hypo an dhyperglycemia   P:   ssi   Will hold home medications at present   Best  practice (right click and "Reselect all SmartList Selections" daily)  Diet:  Tube Feed  - to hold for potential extubation Pain/Anxiety/Delirium protocol (if indicated): Yes (RASS goal 0) and adim extubation VAP protocol (if indicated): Yes DVT prophylaxis: LMWH GI prophylaxis: H2B Glucose control:  SSI Yes Central venous access:  N/A Arterial line:  N/A Foley:  Yes, and it is no longer needed - will dc 12/19/20 Mobility:  bed rest  PT consulted: N/A Last date of multidisciplinary goals of care discussion [pending]  - family update: Mom Pricilla Riffle [phone] updated over phone Code Status:  full code Disposition: ICU (? Needs [facility])     ATTESTATION & SIGNATURE   The patient Caterina Racine Shenell Rogalski is critically ill with multiple organ systems failure and requires high complexity decision making for assessment and support, frequent evaluation and titration of therapies, application of advanced monitoring technologies and extensive interpretation of multiple databases.   Critical Care Time devoted to patient care services described in this note is  35  Minutes. This time reflects time of care of this signee Dr Kalman Shan. This critical care time does not reflect procedure time, or teaching time or supervisory time of PA/NP/Med student/Med Resident etc but could involve care discussion time     Dr. Kalman Shan, M.D., [facility].C.P Pulmonary and Critical Care Medicine Staff Physician  System [place] [facility] Pager: [phone], If no answer or between  15:00h - 7:00h: call [phone]  12/19/2020 9:01 AM    LABS    PULMONARY Recent Labs  Lab 12/17/20 1936 12/18/20 0430  PHART 7.209* 7.274*  PCO2ART 26.9* 50.9*  PO2ART 48.8* 414*  HCO3 10.5* 22.9  O2SAT 76.5 99.8    CBC Recent Labs  Lab 12/17/20 2030 12/18/20 0301 12/19/20 0005  HGB 11.2* 11.7* 11.5*  HCT 33.7* 35.3* 36.0  WBC 15.7* 14.0* 10.5  PLT 173 205 202     COAGULATION Recent Labs  Lab 12/19/20 0005  INR 1.0    CARDIAC  No results for input(s): TROPONINI in the last 168 hours. No results for input(s): PROBNP in the last 168 hours.   CHEMISTRY Recent Labs  Lab 12/17/20 1708 12/17/20 2030 12/18/20 0222 12/19/20 0005  NA 141  --  145 145  K 3.4*  --  3.2* 3.3*  CL 111  --  116* 115*  CO2 10*  --  17* 24  GLUCOSE 198*  --  103* 112*  BUN 11  --  7 <5*  CREATININE 1.13* 0.81 0.79 0.79  CALCIUM 8.4*  --  8.2* 8.6*  MG  --  2.1 2.6*  --   PHOS  --  2.6 3.1  --    Estimated Creatinine Clearance: 85.2 mL/min (by C-G formula based on SCr of 0.79 mg/dL).   LIVER Recent Labs  Lab 12/17/20 1708 12/19/20 0005  AST 44* 22  ALT 39 22  ALKPHOS 98 72  BILITOT 0.9 0.6  PROT 7.0 6.4*  ALBUMIN 3.4* 2.9*  INR  --  1.0     INFECTIOUS Recent Labs  Lab 12/19/20 0005  LATICACIDVEN 1.2     ENDOCRINE CBG (last 3)  Recent Labs    12/18/20 2325 12/19/20 0357 12/19/20 0741  GLUCAP 101* 114* 111*         IMAGING x48h  - image(s) personally visualized  -   highlighted in bold DG Abd 1 View  Result Date: 12/17/2020 CLINICAL DATA:  OG tube placement. EXAM: ABDOMEN - 1 VIEW COMPARISON:  Radiograph earlier this day. FINDINGS: Enteric tube is been retracted, the tip and side port remain below the diaphragm in the stomach. There is mild gaseous gastric distension. No small bowel dilatation in the upper abdomen. IMPRESSION: Enteric tube has been retracted, tip and side-port remain below the diaphragm in the stomach. Mild gaseous gastric distension. Electronically Signed   By: Narda Rutherford M.D.   On: 12/17/2020 22:07   CT Head Wo Contrast  Result Date: 12/17/2020 CLINICAL DATA:  Altered mental status.  Possible overdose. EXAM: CT HEAD WITHOUT CONTRAST TECHNIQUE: Contiguous axial images were obtained from the base of the skull through the vertex without intravenous contrast. COMPARISON:  Jan 29, 2020. FINDINGS: Brain: No  evidence of acute infarction, hemorrhage, hydrocephalus, extra-axial collection or mass lesion/mass effect. Vascular: No hyperdense vessel or unexpected calcification. Skull: Normal. Negative for fracture or focal lesion. Sinuses/Orbits: No acute finding. Other: None. IMPRESSION: Normal head CT. Electronically Signed   By: Lupita Raider M.D.   On: 12/17/2020 16:53   DG CHEST PORT 1 VIEW  Result Date: 12/19/2020 CLINICAL DATA:  Endotracheal tube EXAM: PORTABLE CHEST 1 VIEW COMPARISON:  12/18/2020 FINDINGS: Endotracheal tube terminates 1.5 cm above the carina. Enteric tube courses into the stomach. Mild patchy opacities in the right lung and at the left lung base, compatible with pneumonia, grossly unchanged. No pleural effusion or pneumothorax. The heart is normal in size. IMPRESSION: Multifocal pneumonia, grossly unchanged. Endotracheal tube terminates 1.5 cm above the carina. Additional support apparatus as above. Electronically Signed   By: Charline Bills M.D.   On: 12/19/2020 06:25   DG Chest Port 1 View  Result Date: 12/18/2020 CLINICAL DATA:  Pneumonia EXAM: PORTABLE CHEST 1 VIEW COMPARISON:  Yesterday FINDINGS: Endotracheal tube with tip halfway between the clavicular heads and carina. The enteric tube reaches the stomach. Generous heart size attributed to technique. Extensive artifact from EKG leads. Lower lung volumes. Right more than left airspace disease with worsening aeration. No visible effusion or pneumothorax. IMPRESSION: 1. Stable hardware positioning. 2. Bilateral pneumonia.  Aeration has worsened since yesterday. Electronically Signed   By: Marnee Spring M.D.   On: 12/18/2020 05:05   DG Chest Portable 1 View  Result Date: 12/17/2020 CLINICAL DATA:  Intubation and OG tube placement EXAM: PORTABLE CHEST 1 VIEW COMPARISON:  Earlier same day FINDINGS: Endotracheal tube is approximately 2.5 cm above the carina. Enteric tube passes into the stomach. Patchy density  in the right lung,  primarily in the upper lobe. Probable retrocardiac density. No pleural effusion. No pneumothorax. Stable cardiomediastinal contours. IMPRESSION: 1. Endotracheal tube and enteric tube as above. 2. Patchy bilateral pulmonary opacities, similar to prior. Electronically Signed   By: Guadlupe Spanish M.D.   On: 12/17/2020 20:37   DG Chest Port 1 View  Result Date: 12/17/2020 CLINICAL DATA:  Post seizure.  Possible aspiration. EXAM: PORTABLE CHEST 1 VIEW COMPARISON:  Chest radiograph 12/01/2020, CT 12/02/2020 FINDINGS: Airspace consolidation throughout the right upper lobe, lesser patchy opacity at the right lung base. There is also likely retrocardiac opacity. Heart is normal in size with unchanged mediastinal contours. The patient is rotated. No pneumothorax or significant pleural effusion. No acute osseous abnormalities are seen. IMPRESSION: Multifocal airspace consolidation, right greater than left, most consistent with multifocal pneumonia. Given provided history, aspiration is suspected. Electronically Signed   By: Narda Rutherford M.D.   On: 12/17/2020 17:09   DG Abd Portable 1 View  Result Date: 12/17/2020 CLINICAL DATA:  Check gastric catheter placement EXAM: PORTABLE ABDOMEN - 1 VIEW COMPARISON:  None. FINDINGS: Gastric catheter is noted within the stomach. Scattered large and small bowel gas is noted. No free air is seen. IMPRESSION: Gastric catheter within the stomach. Electronically Signed   By: Alcide Clever M.D.   On: 12/17/2020 20:36   ECHOCARDIOGRAM LIMITED  Result Date: 12/18/2020    ECHOCARDIOGRAM REPORT   Patient Name:   DENECE SHEARER Date of Exam: 12/18/2020 Medical Rec #:  [id number]                  Height:       63.0 in Accession #:    [id number]                 Weight:       153.0 lb Date of Birth:  1971/09/12                   BSA:          1.726 m Patient Age:    49 years                   BP:           87/53 mmHg Patient Gender: F                          HR:           57  bpm. Exam Location:  Inpatient Procedure: Limited Echo, Color Doppler and Cardiac Doppler Indications:    acute respiratory distress  History:        Patient has prior history of Echocardiogram examinations, most                 recent 12/02/2020. Risk Factors:polysubstance abuse. Alcohol                 abuse.  Sonographer:    Delcie Roch Referring Phys: 80 Jaclynne Baldo  Sonographer Comments: Echo performed with patient supine and on artificial respirator. IMPRESSIONS  1. Left ventricular ejection fraction, by estimation, is 55 to 60%. The left ventricle has normal function. The left ventricle has no regional wall motion abnormalities. Left ventricular diastolic parameters were normal.  2. Right ventricular systolic function is normal. The right ventricular size is normal. There is normal pulmonary artery systolic pressure.  3. The mitral valve is normal in structure. Mild mitral  valve regurgitation. No evidence of mitral stenosis.  4. Tricuspid valve regurgitation is severe.  5. The aortic valve is normal in structure. Aortic valve regurgitation is not visualized. No aortic stenosis is present.  6. The inferior vena cava is normal in size with greater than 50% respiratory variability, suggesting right atrial pressure of 3 mmHg. FINDINGS  Left Ventricle: Left ventricular ejection fraction, by estimation, is 55 to 60%. The left ventricle has normal function. The left ventricle has no regional wall motion abnormalities. The left ventricular internal cavity size was normal in size. There is  no left ventricular hypertrophy. Left ventricular diastolic parameters were normal. Right Ventricle: The right ventricular size is normal.Right ventricular systolic function is normal. There is normal pulmonary artery systolic pressure. The tricuspid regurgitant velocity is 2.61 m/s, and with an assumed right atrial pressure of 3 mmHg, the estimated right ventricular systolic pressure is 30.2 mmHg. Left Atrium: Left  atrial size was normal in size. Right Atrium: Right atrial size was normal in size. Pericardium: There is no evidence of pericardial effusion. Mitral Valve: The mitral valve is normal in structure. Mild mitral valve regurgitation. No evidence of mitral valve stenosis. Tricuspid Valve: The tricuspid valve is normal in structure. Tricuspid valve regurgitation is severe. No evidence of tricuspid stenosis. Aortic Valve: The aortic valve is normal in structure. Aortic valve regurgitation is not visualized. No aortic stenosis is present. Pulmonic Valve: The pulmonic valve was normal in structure. Pulmonic valve regurgitation is trivial. No evidence of pulmonic stenosis. Aorta: The aortic root is normal in size and structure. Venous: The inferior vena cava is normal in size with greater than 50% respiratory variability, suggesting right atrial pressure of 3 mmHg.   LEFT VENTRICLE PLAX 2D LVIDd:         4.40 cm Diastology LVIDs:         3.00 cm LV e' medial:    8.70 cm/s LV PW:         1.00 cm LV E/e' medial:  10.0 LV IVS:        0.80 cm LV e' lateral:   9.90 cm/s                        LV E/e' lateral: 8.8  IVC IVC diam: 1.90 cm LEFT ATRIUM         Index LA diam:    3.50 cm 2.03 cm/m  AORTIC VALVE LVOT Vmax:   90.70 cm/s LVOT Vmean:  60.900 cm/s LVOT VTI:    0.205 m MITRAL VALVE               TRICUSPID VALVE MV Area (PHT): 4.21 cm    TR Peak grad:   27.2 mmHg MV Decel Time: 180 msec    TR Vmax:        261.00 cm/s MV E velocity: 87.00 cm/s MV A velocity: 71.10 cm/s  SHUNTS MV E/A ratio:  1.22        Systemic VTI: 0.20 m Olga MillersBrian Crenshaw MD Electronically signed by Olga MillersBrian Crenshaw MD Signature Date/Time: 12/18/2020/3:52:43 PM    Final

## 2020-12-19 NOTE — Progress Notes (Signed)
K+3.3 ?Replaced per protocol  ?

## 2020-12-19 NOTE — Progress Notes (Signed)
Notified MD of BP 167/90(119). No new orders at this time. RN will continue to monitor.

## 2020-12-19 NOTE — Progress Notes (Signed)
eLink Physician-Brief Progress Note Patient Name: Margaret Mccann DOB: [birth year] MRN: [id number]   Date of Service  12/19/2020  HPI/Events of Note  Notified of hypertension despite clonidine Patient seen asleep BP 167/90  HR 72  eICU Interventions  Ordered prn hydralazine if SBP persistently over 170     Intervention Category Major Interventions: Hypertension - evaluation and management  Rosalie Gums [place] 12/19/2020, 8:08 PM

## 2020-12-19 NOTE — Progress Notes (Signed)
   Self extubated but having agitation - withdrawal per RN who remembers her from a prior admit   Plan  - haldol 5mg  IV x 1 and then prn  - ciwa ativan protocol - clonidine detox protocol  - continue precredex gtt  - reintubate if worse   SIGNATURE    Dr. , M.D., F.C.C.P,  Pulmonary and Critical Care Medicine Staff Physician, [facility] Center Director - Interstitial Lung Disease  Program  Pulmonary Fibrosis [facility] [place], [place], [place]  Pager: [phone], If no answer  OR between  19:00-7:00h: page [phone] Telephone (clinical office): [phone] Telephone (research): [phone]  10:11 AM 12/19/2020

## 2020-12-19 NOTE — Consult Note (Addendum)
Client confused, in restraints, looking around the room, stating, "I'm good, mom's coming."  RN reports she extubated herself earlier today and received agitation medications.  Psych will continue to follow and assess her when she is able to participate in the assessment regarding her substance abuse issues.  Admitted after withdrawal seizures, history of alcohol use disorder.  Positive on this admission for benzodiazepines and cocaine. Receives clonazepam 0.5 mg BID from her psychiatrist, Dr. Evelene Croon, last filled on 12/02/20.  Continue the Ativan taper and please do not restart any benzodiazepine.   Nanine Means, PMHNP

## 2020-12-19 NOTE — Progress Notes (Signed)
eLink Physician-Brief Progress Note Patient Name: Margaret Mccann DOB: Feb 06, 1971 MRN: [id number]   Date of Service  12/19/2020  HPI/Events of Note  Received request for ankle restraints as patient kept trying to get out of bed. Calmed down after Haldol given  eICU Interventions  Ordered ankle restraints for now. Discussed with bedside RN who will reassess if this will be needed throughout the night or can be safely removed     Intervention Category Minor Interventions: Agitation / anxiety - evaluation and management  Darl Pikes 12/19/2020, 9:30 PM

## 2020-12-20 LAB — BASIC METABOLIC PANEL
Anion gap: 9 (ref 5–15)
BUN: <5 mg/dL — ABNORMAL LOW (ref 6–20)
CO2: 24 mmol/L (ref 22–32)
Calcium: 8.9 mg/dL (ref 8.9–10.3)
Chloride: 105 mmol/L (ref 98–111)
Creatinine, Ser: 0.61 mg/dL (ref 0.44–1.00)
GFR, Estimated: >60 mL/min (ref >60)
Glucose, Bld: 66 mg/dL — ABNORMAL LOW (ref 70–99)
Potassium: 3.8 mmol/L (ref 3.5–5.1)
Sodium: 138 mmol/L (ref 135–145)

## 2020-12-20 LAB — GLUCOSE, CAPILLARY
Glucose-Capillary: 69 mg/dL — ABNORMAL LOW (ref 70–99)
Glucose-Capillary: 85 mg/dL (ref 70–99)
Glucose-Capillary: 98 mg/dL (ref 70–99)
Glucose-Capillary: 73 mg/dL (ref 70–99)
Glucose-Capillary: 103 mg/dL — ABNORMAL HIGH (ref 70–99)

## 2020-12-20 LAB — CBC
HCT: 33.3 % — ABNORMAL LOW (ref 36.0–46.0)
Hemoglobin: 10.7 g/dL — ABNORMAL LOW (ref 12.0–15.0)
MCH: 31 pg (ref 26.0–34.0)
MCHC: 32.1 g/dL (ref 30.0–36.0)
MCV: 96.5 fL (ref 80.0–100.0)
Platelets: 193 10*3/uL (ref 150–400)
RBC: 3.45 MIL/uL — ABNORMAL LOW (ref 3.87–5.11)
RDW: 13.9 % (ref 11.5–15.5)
WBC: 10.2 10*3/uL (ref 4.0–10.5)
nRBC: 0.2 % (ref 0.0–0.2)

## 2020-12-20 LAB — MAGNESIUM: Magnesium: 1.5 mg/dL — ABNORMAL LOW (ref 1.7–2.4)

## 2020-12-20 MED ORDER — MAGNESIUM SULFATE 2 GM/50ML IV SOLN
2.0000 g | Freq: Once | INTRAVENOUS | Status: AC
  Administered 2020-12-20: 2 g via INTRAVENOUS
  Filled 2020-12-20: qty 50

## 2020-12-20 MED ORDER — POTASSIUM CHLORIDE CRYS ER 20 MEQ PO TBCR
40.0000 meq | EXTENDED_RELEASE_TABLET | Freq: Two times a day (BID) | ORAL | Status: DC
  Administered 2020-12-20 – 2020-12-22 (×5): 40 meq via ORAL
  Filled 2020-12-20 (×5): qty 2

## 2020-12-20 MED ORDER — QUETIAPINE FUMARATE 50 MG PO TABS
25.0000 mg | ORAL_TABLET | Freq: Two times a day (BID) | ORAL | Status: DC
  Administered 2020-12-20 – 2020-12-23 (×7): 25 mg via ORAL
  Filled 2020-12-20 (×7): qty 1

## 2020-12-20 MED ORDER — FUROSEMIDE 10 MG/ML IJ SOLN
40.0000 mg | Freq: Once | INTRAMUSCULAR | Status: AC
  Administered 2020-12-20: 40 mg via INTRAVENOUS
  Filled 2020-12-20: qty 4

## 2020-12-20 MED ORDER — DEXTROSE 50 % IV SOLN
INTRAVENOUS | Status: AC
  Administered 2020-12-20: 50 mL
  Filled 2020-12-20: qty 50

## 2020-12-20 MED ORDER — LORAZEPAM 2 MG/ML IJ SOLN
1.0000 mg | INTRAMUSCULAR | Status: AC | PRN
  Administered 2020-12-22: 2 mg via INTRAVENOUS
  Filled 2020-12-20 (×2): qty 1

## 2020-12-20 MED ORDER — ACETAMINOPHEN 650 MG RE SUPP
650.0000 mg | RECTAL | Status: DC | PRN
  Administered 2020-12-20: 650 mg via RECTAL
  Filled 2020-12-20: qty 1

## 2020-12-20 MED ORDER — DEXMEDETOMIDINE HCL IN NACL 200 MCG/50ML IV SOLN: 0.4000 ug/kg/h | INTRAVENOUS | Status: DC

## 2020-12-20 MED ORDER — ORAL CARE MOUTH RINSE: 15.0000 mL | Freq: Two times a day (BID) | OROMUCOSAL | Status: DC

## 2020-12-20 MED ORDER — LORAZEPAM 1 MG PO TABS
1.0000 mg | ORAL_TABLET | ORAL | Status: AC | PRN
  Administered 2020-12-20 (×2): 3 mg via ORAL
  Administered 2020-12-20 – 2020-12-21 (×2): 2 mg via ORAL
  Administered 2020-12-21 (×5): 1 mg via ORAL
  Administered 2020-12-22 (×3): 2 mg via ORAL
  Administered 2020-12-22: 3 mg via ORAL
  Filled 2020-12-20: qty 1
  Filled 2020-12-20 (×2): qty 2
  Filled 2020-12-20: qty 1
  Filled 2020-12-20: qty 2
  Filled 2020-12-20: qty 1
  Filled 2020-12-20: qty 2
  Filled 2020-12-20: qty 3
  Filled 2020-12-20: qty 1
  Filled 2020-12-20: qty 3
  Filled 2020-12-20: qty 1
  Filled 2020-12-20: qty 3
  Filled 2020-12-20: qty 2

## 2020-12-20 MED ORDER — ACETAMINOPHEN 325 MG PO TABS
650.0000 mg | ORAL_TABLET | ORAL | Status: DC | PRN
  Administered 2020-12-22 – 2020-12-24 (×2): 650 mg via ORAL
  Filled 2020-12-20 (×2): qty 2

## 2020-12-20 NOTE — Progress Notes (Signed)
eLink Physician-Brief Progress Note Patient Name: Novalyn Lajara DOB: 1971/05/16 MRN: [id number]   Date of Service  12/20/2020  HPI/Events of Note  Patient NPO, however, has been taking PO medications without incident all day. No order for same.   eICU Interventions  Plan: 1. NPO except sips with meds and ice chips.      Intervention Category Major Interventions: Other:  Lenell Antu 12/20/2020, 8:32 PM

## 2020-12-20 NOTE — Progress Notes (Signed)
Precedex titrated off.  Patient passes dysphagia screen at bedside. No signs of aspiration noted. Swallow observed with no gag cough or difficulty passing water. Order obtained for po meds sips and ice chips. Will continue to monitor closely

## 2020-12-20 NOTE — Progress Notes (Signed)
eLink Physician-Brief Progress Note Patient Name: Margaret Mccann DOB: 1971/07/24 MRN: [id number]   Date of Service  12/20/2020  HPI/Events of Note  Notified of fever. Request for prn Tylenol PO  On chart review patient is on  ampicillin-sulbactam for aspiration pneumonia No central line on image review Blood culture 3/25 NGTD  eICU Interventions  Tylenol prn ordered     Intervention Category Minor Interventions: Routine modifications to care plan (e.g. PRN medications for pain, fever)  Rosalie Gums Aniketh Huberty 12/20/2020, 12:18 AM

## 2020-12-20 NOTE — Consult Note (Signed)
  Patient remains difficult to assess, due to ongoing confusion.  Patient also remains on 1.2 of Precedex for sedation, and agitation.  Patient appears to be on day 3 of alcohol detox, discussed with nurse to continue to monitor for a peak of alcohol withdrawal symptoms.  She is to continue her Ativan taper, and once safely detox will recommend we do not resume any benzodiazepines.   -Will add Seroquel 25 mg p.o. twice daily for agitation, confusion, and psychosis. -Continue with CIWA protocol and Ativan detox. -Psychiatry has made 2 previous attempts to assess patient, however have been unsuccessful both times. Please reconsult psychiatry once patient is medically stable and able to participate in psychiatric evaluation.

## 2020-12-20 NOTE — Progress Notes (Signed)
NAME:  Margaret Mccann, MRN:  [id number], DOB:  Feb 03, 1971, LOS: 3 ADMISSION DATE:  12/17/2020, CONSULTATION DATE:  12/17/2020 REFERRING MD:  ED physician, CHIEF COMPLAINT:  Admitted with altered mentation  BRIEF  Patient with a history of bipolar disorder, alcohol abuse, cocaine Nov 2021. PTSD Have been out drinking came in about 4 AM according to mother intoxicated. Patient later found by mother lethargic, she had vomited, altered mentation EMS activated Had a seizure on route to the hospital  Pertinent  Medical History  Morgellons syndrome  Bipolar 1 disorder  Depression and anxiety  Poly substance abuse  Alcohol use   Significant Hospital Events: Including procedures, antibiotic start and stop dates in addition to other pertinent events    3/25 admitted and intubated. Unasyn started UDS positive for benzo and cocaine, ETOH < 10  3/26 Down to 40% fio2 on vent. Sedated with diprivan gtt (dropping bp) and fent gtt. On sedation wean -> nystagmus. Did follow command x 1 for RN  3/27 T max 102  Interim History / Subjective:  Seen lying in bed on NRB with no acute complaints Some issues with agitation overnight  Objective   Blood pressure 126/70, pulse 73, temperature 99.68 F (37.6 C), resp. rate (!) 25, height 5\' 3"  (1.6 m), weight 76.7 kg, SpO2 98 %.    Vent Mode: PSV;CPAP FiO2 (%):  [30 %] 30 % Set Rate:  [20 bmp] 20 bmp Vt Set:  [420 mL] 420 mL PEEP:  [5 cmH20] 5 cmH20 Pressure Support:  [5 cmH20] 5 cmH20 Plateau Pressure:  [19 cmH20] 19 cmH20   Intake/Output Summary (Last 24 hours) at 12/20/2020 0700 Last data filed at 12/20/2020 0600 Gross per 24 hour  Intake 1462.94 ml  Output 1000 ml  Net 462.94 ml   Filed Weights   12/17/20 2132 12/18/20 0425 12/19/20 0500  Weight: 69.4 kg 69.4 kg 76.7 kg    Examination: General: Adult female lying in bed on NRB in NAD HEENT: [place]/AT, MM pink/moist, PERRL,  Neuro: Sleepy on precedex but able to awake easy and able  to follow simple commands  CV: s1s2 regular rate and rhythm, no murmur, rubs, or gallops,  PULM:  Clear to ascultation bilaterally, no added breath sounds no increased work of breathing oxygen sats 100% on NRB GI: soft, bowel sounds active in all 4 quadrants, non-tender, non-distended Extremities: warm/dry, 2+ right upper extremity edema  Skin: no rashes or lesions  Labs/imaging that I havepersonally reviewed    3/27 CXR > Multifocal pneumonia  3/28 hypokalemia resolved, hypomagnesemia   Resolved Hospital Problem list   Possible nystagumus Mild hypotension felt to be sedation related AKI resolved   Respiratory acidosis  Transaminates   Assessment & Plan:   Acute resp failure following  -Self extubated 3/27 Aspiration pneumonia  -CXR with multifocal PNA right greater than left on admission  P:   Head of bed elevated 30 degrees Aspiration precautions  Continue Unasyn, if able to tolerate oral diet will transition to Augmentin for a total of 7 days of coverage  Ensure adequate pulmonary hygiene  Encourage frequent use of IS and flutter valve  Mobilize as able  Follow cultures   Acute encephalopathy -Head CT negative on admission  Seizures on admission  -No further seizure activity since admit  P: Maintain neuro protective measures Nutrition and bowel regiment  Seizure precautions  Aspirations precautions   Polysubstance abuse - cocaine and benzo at admit  Alcohol abuse  -Fatty liver on CT  at admit Hx of Bipolar with prior suicide attempts  -She has a known history of depression and PTSD  P: Cessation education when appropriate  CIWA  Supportive care Continue Thiamin, Folate acid, and multivitamin  Consider [facility]  Wean Precedex with ultimate goal of discontinuation   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  -echocardiogram 3/26/2s with EF 55 to 60% with severe tricuspid valve regurgitation P: Needs cardiology follow-up as outpatient Continuous telemetry    Hypomagnesemia  P: Trend Bmet Supplement as needed    Anemia of critical illness P: Trend CBC Transfuse per protocol  Hgb goal greater than 7  Best practice (right click and "Reselect all SmartList Selections" daily)  Diet:  NPO, advance to regular diet as able  Pain/Anxiety/Delirium protocol: Low dose Precedex  VAP protocol (if indicated): Not indicated DVT prophylaxis: LMWH GI prophylaxis: N/A and H2B Glucose control:  SSI No Central venous access:  N/A Arterial line:  N/A Foley:  Yes, and it is no longer needed - will dc 12/20/20 Mobility:  OOB  PT consulted: Yes Last date of multidisciplinary goals of care discussion: Family update: Mom Pricilla Riffle daily Code Status:  full code Disposition: ICU   ATTESTATION & SIGNATURE  CRITICAL CARE Performed by: Delfin Gant  Total critical care time: 38 minutes  Critical care time was exclusive of separately billable procedures and treating other patients.  Critical care was necessary to treat or prevent imminent or life-threatening deterioration.  Critical care was time spent personally by me on the following activities: development of treatment plan with patient and/or surrogate as well as nursing, discussions with consultants, evaluation of patient's response to treatment, examination of patient, obtaining history from patient or surrogate, ordering and performing treatments and interventions, ordering and review of laboratory studies, ordering and review of radiographic studies, pulse oximetry and re-evaluation of patient's condition.  Delfin Gant, NP-C [facility] Personal contact information can be found on Amion  If no response please page: Adult pulmonary and critical care medicine pager on Amion unitl 7pm After 7pm please call [phone] 12/20/2020, 7:33 AM

## 2020-12-20 NOTE — Progress Notes (Signed)
Called patient's mother and left a message stating her daughter had been moved to room 1237.

## 2020-12-20 NOTE — TOC Initial Note (Signed)
Transition of Care [facility]) - Initial/Assessment Note    Patient Details  Name: Margaret Mccann MRN: [id number] Date of Birth: 27-Apr-1971  Transition of Care [facility]) CM/SW Contact:    Golda Acre, RN Phone Number: 12/20/2020, 8:09 AM  Clinical Narrative:                 Patient with a history of bipolar disorder, alcohol abuse, PTSD Have been out drinking Came in about 4 AM according to mother intoxicated Slept all day Patient was found by mother lethargic, she had vomited, altered mentation EMS activated Had a seizure on route to the hospital Intubated on arrival on [id number] extubated on [id number] PLan is to return to home will need substance and alcohol abuse resources when awake and alert. Expected Discharge Plan: Home/Self Care Barriers to Discharge: Continued Medical Work up   Patient Goals and CMS Choice        Expected Discharge Plan and Services Expected Discharge Plan: Home/Self Care       Living arrangements for the past 2 months: Single Family Home                                      Prior Living Arrangements/Services Living arrangements for the past 2 months: Single Family Home Lives with:: Self Patient language and need for interpreter reviewed:: Yes        Need for Family Participation in Patient Care: Yes (Comment) Care giver support system in place?: Yes (comment)   Criminal Activity/Legal Involvement Pertinent to Current Situation/Hospitalization: No - Comment as needed  Activities of Daily Living Home Assistive Devices/Equipment: Eyeglasses (reading glasses) ADL Screening (condition at time of admission) Patient's cognitive ability adequate to safely complete daily activities?: No Is the patient deaf or have difficulty hearing?: No Does the patient have difficulty seeing, even when wearing glasses/contacts?: No Does the patient have difficulty concentrating, remembering, or making decisions?: Yes Patient able to express need  for assistance with ADLs?: No Does the patient have difficulty dressing or bathing?: Yes Independently performs ADLs?: No Communication: Dependent Is this a change from baseline?: Change from baseline, expected to last >3 days Dressing (OT): Dependent Is this a change from baseline?: Change from baseline, expected to last >3 days Grooming: Dependent Is this a change from baseline?: Change from baseline, expected to last >3 days Feeding: Dependent Is this a change from baseline?: Change from baseline, expected to last >3 days Bathing: Dependent Is this a change from baseline?: Change from baseline, expected to last >3 days Toileting: Dependent Is this a change from baseline?: Change from baseline, expected to last >3days In/Out Bed: Dependent Is this a change from baseline?: Change from baseline, expected to last >3 days Walks in Home: Dependent Is this a change from baseline?: Change from baseline, expected to last >3 days Does the patient have difficulty walking or climbing stairs?: Yes Weakness of Legs: Both Weakness of Arms/Hands: Both  Permission Sought/Granted                  Emotional Assessment Appearance:: Appears stated age Attitude/Demeanor/Rapport: Unable to Assess Affect (typically observed): Unable to Assess Orientation: : Fluctuating Orientation (Suspected and/or reported Sundowners) Alcohol / Substance Use: Alcohol Use    Admission diagnosis:  Acute respiratory failure with hypoxemia (HCC) [J96.01] Patient Active Problem List   Diagnosis Date Noted  . Benzodiazepine abuse (HCC) 12/19/2020  . Encephalopathy acute 12/19/2020  .  Acute respiratory acidosis 12/19/2020    Class: Acute  . Tricuspid valve regurgitation 12/18/2020  . Acute respiratory failure with hypoxemia (HCC) 12/17/2020  . AKI (acute kidney injury) (HCC) 12/17/2020  . Aspiration pneumonia (HCC) 12/17/2020    Class: Acute  . Major depressive disorder, recurrent episode with anxious  distress (HCC) 08/11/2020  . Intentional drug overdose (HCC) 08/04/2020  . Grief 07/30/2020  . PTSD (post-traumatic stress disorder) 07/30/2020  . Major depressive disorder, recurrent severe without psychotic features (HCC) 06/04/2018  . Suicide attempt (HCC)   . Seizure (HCC) 06/02/2018  . Cocaine abuse (HCC) 06/03/2012  . 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, [place] - [address] [place] [address] Phone: [phone] Fax: [phone]  [facility] [address] [place] - [address] [place] [address] Phone: [phone] Fax: [phone]     Social Determinants of Health (SDOH) Interventions    Readmission Risk Interventions No flowsheet data found.

## 2020-12-21 ENCOUNTER — Ambulatory Visit (HOSPITAL_COMMUNITY): Payer: No Payment, Other | Admitting: Licensed Clinical Social Worker

## 2020-12-21 DIAGNOSIS — R569 Unspecified convulsions: Secondary | ICD-10-CM

## 2020-12-21 LAB — BASIC METABOLIC PANEL
Anion gap: 9 (ref 5–15)
BUN: 11 mg/dL (ref 6–20)
CO2: 26 mmol/L (ref 22–32)
Calcium: 8.3 mg/dL — ABNORMAL LOW (ref 8.9–10.3)
Chloride: 101 mmol/L (ref 98–111)
Creatinine, Ser: 0.67 mg/dL (ref 0.44–1.00)
GFR, Estimated: >60 mL/min (ref >60)
Glucose, Bld: 85 mg/dL (ref 70–99)
Potassium: 3.9 mmol/L (ref 3.5–5.1)
Sodium: 136 mmol/L (ref 135–145)

## 2020-12-21 LAB — CBC
HCT: 32 % — ABNORMAL LOW (ref 36.0–46.0)
Hemoglobin: 10.7 g/dL — ABNORMAL LOW (ref 12.0–15.0)
MCH: 31.3 pg (ref 26.0–34.0)
MCHC: 33.4 g/dL (ref 30.0–36.0)
MCV: 93.6 fL (ref 80.0–100.0)
Platelets: 250 10*3/uL (ref 150–400)
RBC: 3.42 MIL/uL — ABNORMAL LOW (ref 3.87–5.11)
RDW: 13.5 % (ref 11.5–15.5)
WBC: 9 10*3/uL (ref 4.0–10.5)
nRBC: 0.2 % (ref 0.0–0.2)

## 2020-12-21 LAB — GLUCOSE, CAPILLARY: Glucose-Capillary: 79 mg/dL (ref 70–99)

## 2020-12-21 MED ORDER — AMOXICILLIN-POT CLAVULANATE 875-125 MG PO TABS
1.0000 | ORAL_TABLET | Freq: Two times a day (BID) | ORAL | Status: AC
  Administered 2020-12-21 – 2020-12-23 (×6): 1 via ORAL
  Filled 2020-12-21 (×6): qty 1

## 2020-12-21 NOTE — Progress Notes (Signed)
NAME:  Margaret Mccann, MRN:  [id number], DOB:  01-04-1971, LOS: 4 ADMISSION DATE:  12/17/2020, CONSULTATION DATE:  12/17/2020 REFERRING MD:  ED physician, CHIEF COMPLAINT:  Admitted with altered mentation  BRIEF  Patient with a history of bipolar disorder, alcohol abuse, cocaine Nov 2021. PTSD Have been out drinking came in about 4 AM according to mother intoxicated. Patient later found by mother lethargic, she had vomited, altered mentation EMS activated Had a seizure on route to the hospital  Pertinent  Medical History  Morgellons syndrome  Bipolar 1 disorder  Depression and anxiety  Poly substance abuse  Alcohol use   Significant Hospital Events: Including procedures, antibiotic start and stop dates in addition to other pertinent events    3/25 admitted and intubated. Unasyn started UDS positive for benzo and cocaine, ETOH < 10  3/26 Down to 40% fio2 on vent. Sedated with diprivan gtt (dropping bp) and fent gtt. On sedation wean -> nystagmus. Did follow command x 1 for RN  3/27 T max 102  Precedex drip off, afebrile overnight   Interim History / Subjective:  No acute issues overnight Precdex drip off  Intermittent dry cough Requesting something to eat and drink  Objective   Blood pressure (!) 91/46, pulse 73, temperature 98 F (36.7 C), temperature source Oral, resp. rate (!) 23, height 5\' 3"  (1.6 m), weight 76.7 kg, SpO2 96 %.        Intake/Output Summary (Last 24 hours) at 12/21/2020 0708 Last data filed at 12/21/2020 0600 Gross per 24 hour  Intake 1208.24 ml  Output 550 ml  Net 658.24 ml   Filed Weights   12/17/20 2132 12/18/20 0425 12/19/20 0500  Weight: 69.4 kg 69.4 kg 76.7 kg    Examination: General: Adult well-appearing female lying in bed in no acute distress HEENT: [place]/AT, MM pink/moist, PERRL,  Neuro: Alert and oriented x2, confused to time, nonfocal, able to follow simple commands CV: s1s2 regular rate and rhythm, no murmur, rubs, or  gallops,  PULM: Clear to auscultation bilaterally, no increased work of breathing, no added breath sounds, oxygen saturations 95 to 98% on 4 L nasal cannula GI: soft, bowel sounds active in all 4 quadrants, non-tender, non-distended Extremities: warm/dry, none edema  Skin: no rashes or lesions  Labs/imaging that I havepersonally reviewed    3/27 CXR > Multifocal pneumonia  3/28 hypokalemia resolved, hypomagnesemia   Resolved Hospital Problem list   Possible nystagumus Mild hypotension felt to be sedation related AKI resolved   Respiratory acidosis  Transaminates  Acute resp failure following  -Self extubated 3/27  Assessment & Plan:   Aspiration pneumonia  -CXR with multifocal PNA right greater than left on admission  P:   Patient able to tolerate oral diet, likely can switch to PO Augmentin  Wean supplemental oxygen for sat goal greater than 92 Head of bed elevated 30 degrees. Ensure adequate pulmonary hygiene with frequent IS and flutter valve Follow cultures  Mobilize  Aspiration precautions   Acute encephalopathy, improving -Head CT negative on admission  Seizures on admission  -No further seizure activity since admit  P: Maintain neuro protective measures Nutrition and bowel regiment  Seizure precautions  Aspirations precautions   Polysubstance abuse - cocaine and benzo at admit  Alcohol abuse  -Fatty liver on CT at admit Hx of Bipolar with prior suicide attempts  -She has a known history of depression and PTSD  P: Psych following, appreciate assistance  Continue Seroquel  Cessation education when appropriate  CIWA Supportive care  Continue Thiamin, Folic acid, and multivitamin  Severe tricuspid regurgitation  -echocardiogram 3/26 with EF 55 to 60% with severe tricuspid valve regurgitation P: Continuous telemetry  Will need cardiology follow up as an outpatient   Hypomagnesemia  P: Trend Bmet  Supplement as needed     Anemia of critical  illness P: Trend CBC  Transfuse per protocol  Hgb goal greater than 7  Best practice (right click and "Reselect all SmartList Selections" daily)  Diet: Oral diet Pain/Anxiety/Delirium protocol: N/AA VAP protocol (if indicated): Not indicated DVT prophylaxis: LMWH GI prophylaxis: N/A Glucose control:  SSI No Central venous access:  N/A Arterial line:  N/A Foley:  N/A - dc 12/20/20 Mobility:  OOB  PT consulted: Yes Last date of multidisciplinary goals of care discussion: Family update: Mom Margaret Mccann daily Code Status:  full code Disposition: ICU   ATTESTATION & SIGNATURE  CRITICAL CARE Performed by: Delfin Gant  Total critical care time: 35 minutes  Critical care time was exclusive of separately billable procedures and treating other patients.  Critical care was necessary to treat or prevent imminent or life-threatening deterioration.  Critical care was time spent personally by me on the following activities: development of treatment plan with patient and/or surrogate as well as nursing, discussions with consultants, evaluation of patient's response to treatment, examination of patient, obtaining history from patient or surrogate, ordering and performing treatments and interventions, ordering and review of laboratory studies, ordering and review of radiographic studies, pulse oximetry and re-evaluation of patient's condition.  Delfin Gant, NP-C [facility] Personal contact information can be found on Amion  If no response please page: Adult pulmonary and critical care medicine pager on Amion unitl 7pm After 7pm please call [phone] 12/21/2020, 7:08 AM

## 2020-12-21 NOTE — Evaluation (Signed)
Physical Therapy Evaluation Patient Details Name: Margaret Mccann MRN: [id number] DOB: 1971/07/13 Today's Date: 12/21/2020   History of Present Illness  50 yo female with H/O bipolar, substance abuse, PTSD, admitted  12/17/20 with AMS, siezure on route to ED.  Intubated, then self extubated 12/19/20  Clinical Impression  Patient presents with decreased initiation/following directions. Oriented to self and place.  Patient min guard/min assistance for bed mobility. Stood and took a few side steps along bed.  Patient should progress in mobility and not require post acute PT. Pt admitted with above diagnosis.  Pt currently with functional limitations due to the deficits listed below (see PT Problem List). Pt will benefit from skilled PT to increase their independence and safety with mobility to allow discharge to the venue listed below.       Follow Up Recommendations Home health PT;Supervision/Assistance - 24 hour    Equipment Recommendations  None recommended by PT    Recommendations for Other Services       Precautions / Restrictions Precautions Precautions: Fall Restrictions Weight Bearing Restrictions: No      Mobility  Bed Mobility Overal bed mobility: Needs Assistance Bed Mobility: Supine to Sit;Sit to Supine     Supine to sit: Min guard Sit to supine: Min guard   General bed mobility comments: cues repeated  to initiaite mobilizing to bed edge, minguard for safety. Patient  able to return self to supine.    Transfers Overall transfer level: Needs assistance Equipment used: Rolling walker (2 wheeled) Transfers: Sit to/from Stand Sit to Stand: Min assist         General transfer comment: min assist for safety, cues for activity/task, increased time to respond  Ambulation/Gait Ambulation/Gait assistance: Min assist   Assistive device: Rolling walker (2 wheeled) Gait Pattern/deviations: Step-to pattern     General Gait Details: 4 sidesteps along the  bed.  Stairs            Wheelchair Mobility    Modified Rankin (Stroke Patients Only)       Balance Overall balance assessment: Needs assistance Sitting-balance support: Feet unsupported Sitting balance-Leahy Scale: Fair     Standing balance support: During functional activity;Bilateral upper extremity supported Standing balance-Leahy Scale: Poor Standing balance comment: reliant on support                             Pertinent Vitals/Pain Pain Assessment: No/denies pain    Home Living Family/patient expects to be discharged to:: Private residence Living Arrangements: Parent Available Help at Discharge: Family;Available PRN/intermittently Type of Home: House Home Access: Stairs to enter Entrance Stairs-Rails: Doctor, general practice of Steps: 10 Home Layout: Multi-level Home Equipment: None      Prior Function Level of Independence: Independent               Hand Dominance   Dominant Hand: Right    Extremity/Trunk Assessment   Upper Extremity Assessment Upper Extremity Assessment: Generalized weakness    Lower Extremity Assessment Lower Extremity Assessment: Defer to PT evaluation    Cervical / Trunk Assessment Cervical / Trunk Assessment: Normal  Communication   Communication: No difficulties  Cognition Arousal/Alertness: Awake/alert Behavior During Therapy: Flat affect Overall Cognitive Status: Impaired/Different from baseline Area of Impairment: Attention;Following commands                   Current Attention Level: Sustained   Following Commands: Follows one step commands inconsistently;Follows one step  commands with increased time       General Comments: alert/oriented to place, year, day of the week, with cue able to correct month      General Comments General comments (skin integrity, edema, etc.): VSS on 4L O2    Exercises     Assessment/Plan    PT Assessment Patient needs continued PT  services  PT Problem List Decreased strength;Decreased mobility;Decreased safety awareness;Decreased knowledge of precautions;Decreased activity tolerance;Decreased cognition;Decreased balance;Decreased knowledge of use of DME       PT Treatment Interventions DME instruction;Therapeutic activities;Gait training;Therapeutic exercise;Patient/family education;Cognitive remediation;Functional mobility training;Balance training;Stair training    PT Goals (Current goals can be found in the Care Plan section)  Acute Rehab PT Goals Patient Stated Goal: to eat PT Goal Formulation: With patient Time For Goal Achievement: 01/04/21 Potential to Achieve Goals: Good    Frequency Min 3X/week   Barriers to discharge        Co-evaluation PT/OT/SLP Co-Evaluation/Treatment: Yes Reason for Co-Treatment: For patient/therapist safety;To address functional/ADL transfers PT goals addressed during session: Mobility/safety with mobility OT goals addressed during session: ADL's and self-care       AM-PAC PT "6 Clicks" Mobility  Outcome Measure Help needed turning from your back to your side while in a flat bed without using bedrails?: A Little Help needed moving from lying on your back to sitting on the side of a flat bed without using bedrails?: A Little Help needed moving to and from a bed to a chair (including a wheelchair)?: A Little Help needed standing up from a chair using your arms (e.g., wheelchair or bedside chair)?: A Little Help needed to walk in hospital room?: A Lot Help needed climbing 3-5 steps with a railing? : A Lot 6 Click Score: 16    End of Session   Activity Tolerance: Patient tolerated treatment well Patient left: in bed;with call bell/phone within reach;with bed alarm set;with restraints reapplied Nurse Communication: Mobility status PT Visit Diagnosis: Unsteadiness on feet (R26.81);Difficulty in walking, not elsewhere classified (R26.2)    Time: 0800-0827 PT Time  Calculation (min) (ACUTE ONLY): 27 min   Charges:   PT Evaluation $PT Eval Low Complexity: 1 Low          Blanchard Kelch PT Acute Rehabilitation Services Pager [phone] Office [phone]'   Rada Hay 12/21/2020, 12:22 PM

## 2020-12-21 NOTE — Progress Notes (Signed)
Occupational Therapy Evaluation  Patient lives with her mom in a multi level home with 10 steps to enter and flight of steps to her bedroom. Patient is independent at baseline with self care. Currently patient limited by decreased cognition, safety awareness, activity tolerance, strength and balance. Patient with decreased initiation needing repeated cues to follow through with tasks. Also reports fatigue with side stepping to [facility] with min A x2 for safety and rolling walker "I haven't eaten." Anticipate patient will progress well during hospitalization and will have no further OT needs at discharge, will continue to follow for progress.    12/21/20 1000  OT Visit Information  Last OT Received On 12/21/20  Assistance Needed +2 (safety to ambulate)  PT/OT/SLP Co-Evaluation/Treatment Yes  Reason for Co-Treatment For patient/therapist safety;To address functional/ADL transfers  OT goals addressed during session ADL's and self-care  History of Present Illness 50 yo female with H/O bipolar, substance abuse, PTSD, admitted  12/17/20 with AMS, siezure on route to ED.  Intubated, then self extubated 12/19/20  Precautions  Precautions Fall  Restrictions  Weight Bearing Restrictions No  Home Living  Family/patient expects to be discharged to: Private residence  Living Arrangements Parent  Available Help at Discharge Family;Available PRN/intermittently  Type of Home House  Home Access Stairs to enter  Entrance Stairs-Number of Steps 10  Entrance Stairs-Rails Right;Left  Home Layout Multi-level  Alternate Level Stairs-Number of Steps full flight to upstairs room  Bathroom Shower/Tub Walk-in Corporate treasurer None  Prior Function  Level of Independence Independent  Communication  Communication No difficulties  Pain Assessment  Pain Assessment No/denies pain  Cognition  Arousal/Alertness Awake/alert  Behavior During Therapy Flat affect  Overall Cognitive Status  Impaired/Different from baseline  Area of Impairment Attention;Following commands  Current Attention Level Sustained  Following Commands Follows one step commands inconsistently;Follows one step commands with increased time  General Comments alert/oriented to place, year, day of the week, with cue able to correct month  Upper Extremity Assessment  Upper Extremity Assessment Generalized weakness  Lower Extremity Assessment  Lower Extremity Assessment Defer to PT evaluation  Cervical / Trunk Assessment  Cervical / Trunk Assessment Normal  ADL  Overall ADL's  Needs assistance/impaired  Grooming Brushing hair;Set up;Sitting  Grooming Details (indicate cue type and reason) attempts to brush out knots in hair however reports fatigue  Upper Body Bathing Sitting;Set up  Lower Body Bathing Sit to/from stand;Minimal assistance  Upper Body Dressing  Set up;Sitting  Lower Body Dressing Set up;Bed level;Sit to/from stand;Minimal assistance  Lower Body Dressing Details (indicate cue type and reason) patient able to don socks in long sitting in bed, min A in standing for safety  Toilet Transfer +2 for safety/equipment;RW;Minimal assistance  Toilet Transfer Details (indicate cue type and reason) patient able to side step to [facility], declined further mobility due to having not eaten in few days. limited activity tolerance  Toileting- Clothing Manipulation and Hygiene Sit to/from stand;Sitting/lateral lean;Minimal assistance  Functional mobility during ADLs Min guard;+2 for safety/equipment;Rolling walker;Cueing for safety  General ADL Comments patient below baseline of independence with self care due to impaired cognition, safety awareness, activity tolerance and dynamic balance  Bed Mobility  Overal bed mobility Needs Assistance  Bed Mobility Supine to Sit;Sit to Supine  Supine to sit Min guard  Sit to supine Min guard  General bed mobility comments cues repeated  to initiaite mobilizing to bed edge,  minguard for safety. Patient  able to return self  to supine.  Transfers  Overall transfer level Needs assistance  Equipment used Rolling walker (2 wheeled)  Transfers Sit to/from Stand  Sit to Stand Min assist  General transfer comment min assist for safety, cues for activity/task, increased time to respond  Balance  Overall balance assessment Needs assistance  Sitting-balance support Feet unsupported  Sitting balance-Leahy Scale Fair  Standing balance support During functional activity;Bilateral upper extremity supported  Standing balance-Leahy Scale Poor  Standing balance comment reliant on support  General Comments  General comments (skin integrity, edema, etc.) VSS on 4L O2  OT - End of Session  Equipment Utilized During Treatment Rolling walker;Oxygen;Gait belt  Activity Tolerance Patient limited by fatigue;Patient tolerated treatment well  Patient left in bed;with call bell/phone within reach;with bed alarm set;with restraints reapplied  Nurse Communication Mobility status  OT Assessment  OT Recommendation/Assessment Patient needs continued OT Services  OT Visit Diagnosis Unsteadiness on feet (R26.81);Other symptoms and signs involving cognitive function  OT Problem List Decreased strength;Decreased activity tolerance;Impaired balance (sitting and/or standing);Decreased cognition;Decreased safety awareness  OT Plan  OT Frequency (ACUTE ONLY) Min 2X/week  OT Treatment/Interventions (ACUTE ONLY) Self-care/ADL training;Therapeutic exercise;Therapeutic activities;Cognitive remediation/compensation;Balance training;Patient/family education  AM-PAC OT "6 Clicks" Daily Activity Outcome Measure (Version 2)  Help from another person eating meals? 1 (NPO)  Help from another person taking care of personal grooming? 3  Help from another person toileting, which includes using toliet, bedpan, or urinal? 3  Help from another person bathing (including washing, rinsing, drying)? 3  Help from  another person to put on and taking off regular upper body clothing? 3  Help from another person to put on and taking off regular lower body clothing? 3  6 Click Score 16  OT Recommendation  Follow Up Recommendations No OT follow up;Supervision/Assistance - 24 hour (initial)  OT Equipment None recommended by OT  Individuals Consulted  Consulted and Agree with Results and Recommendations Patient  Acute Rehab OT Goals  Patient Stated Goal to eat  OT Goal Formulation With patient  Time For Goal Achievement 01/04/21  Potential to Achieve Goals Good  OT Time Calculation  OT Start Time (ACUTE ONLY) 0800  OT Stop Time (ACUTE ONLY) 0826  OT Time Calculation (min) 26 min  OT General Charges  $OT Visit 1 Visit  OT Evaluation  $OT Eval Low Complexity 1 Low  Written Expression  Dominant Hand Right   Marlyce Huge OT OT pager: [phone]

## 2020-12-22 ENCOUNTER — Other Ambulatory Visit: Payer: Self-pay

## 2020-12-22 ENCOUNTER — Telehealth (HOSPITAL_COMMUNITY): Payer: Self-pay | Admitting: Licensed Clinical Social Worker

## 2020-12-22 DIAGNOSIS — F1310 Sedative, hypnotic or anxiolytic abuse, uncomplicated: Secondary | ICD-10-CM

## 2020-12-22 DIAGNOSIS — F10231 Alcohol dependence with withdrawal delirium: Principal | ICD-10-CM

## 2020-12-22 LAB — CBC
HCT: 35.3 % — ABNORMAL LOW (ref 36.0–46.0)
Hemoglobin: 11.4 g/dL — ABNORMAL LOW (ref 12.0–15.0)
MCH: 30.6 pg (ref 26.0–34.0)
MCHC: 32.3 g/dL (ref 30.0–36.0)
MCV: 94.9 fL (ref 80.0–100.0)
Platelets: 341 10*3/uL (ref 150–400)
RBC: 3.72 MIL/uL — ABNORMAL LOW (ref 3.87–5.11)
RDW: 13.8 % (ref 11.5–15.5)
WBC: 11.3 10*3/uL — ABNORMAL HIGH (ref 4.0–10.5)
nRBC: 0.3 % — ABNORMAL HIGH (ref 0.0–0.2)

## 2020-12-22 LAB — BASIC METABOLIC PANEL
Anion gap: 9 (ref 5–15)
BUN: 7 mg/dL (ref 6–20)
CO2: 24 mmol/L (ref 22–32)
Calcium: 8.8 mg/dL — ABNORMAL LOW (ref 8.9–10.3)
Chloride: 102 mmol/L (ref 98–111)
Creatinine, Ser: 0.65 mg/dL (ref 0.44–1.00)
GFR, Estimated: >60 mL/min (ref >60)
Glucose, Bld: 106 mg/dL — ABNORMAL HIGH (ref 70–99)
Potassium: 4.3 mmol/L (ref 3.5–5.1)
Sodium: 135 mmol/L (ref 135–145)

## 2020-12-22 LAB — CULTURE, BLOOD (ROUTINE X 2)
Culture: NO GROWTH
Culture: NO GROWTH
Special Requests: ADEQUATE

## 2020-12-22 LAB — MAGNESIUM: Magnesium: 2.1 mg/dL (ref 1.7–2.4)

## 2020-12-22 LAB — GLUCOSE, CAPILLARY: Glucose-Capillary: 101 mg/dL — ABNORMAL HIGH (ref 70–99)

## 2020-12-22 MED ORDER — HALOPERIDOL LACTATE 5 MG/ML IJ SOLN
INTRAMUSCULAR | Status: AC
  Administered 2020-12-22: 5 mg
  Filled 2020-12-22: qty 1

## 2020-12-22 MED ORDER — SODIUM CHLORIDE 0.9% FLUSH: 10.0000 mL | Freq: Two times a day (BID) | INTRAVENOUS | Status: DC

## 2020-12-22 MED ORDER — POTASSIUM CHLORIDE CRYS ER 20 MEQ PO TBCR
40.0000 meq | EXTENDED_RELEASE_TABLET | Freq: Every day | ORAL | Status: AC
  Administered 2020-12-23 – 2020-12-24 (×2): 40 meq via ORAL
  Filled 2020-12-22 (×2): qty 2

## 2020-12-22 MED ORDER — FAMOTIDINE 20 MG PO TABS
20.0000 mg | ORAL_TABLET | Freq: Two times a day (BID) | ORAL | Status: DC
  Administered 2020-12-22 – 2020-12-24 (×5): 20 mg via ORAL
  Filled 2020-12-22 (×5): qty 1

## 2020-12-22 MED ORDER — FOLIC ACID 1 MG PO TABS
1.0000 mg | ORAL_TABLET | Freq: Every day | ORAL | Status: DC
  Administered 2020-12-22 – 2020-12-24 (×3): 1 mg via ORAL
  Filled 2020-12-22 (×3): qty 1

## 2020-12-22 MED ORDER — SODIUM CHLORIDE 0.9% FLUSH: 10.0000 mL | INTRAVENOUS | Status: DC | PRN

## 2020-12-22 MED ORDER — CHLORDIAZEPOXIDE HCL 25 MG PO CAPS
25.0000 mg | ORAL_CAPSULE | Freq: Three times a day (TID) | ORAL | Status: DC
  Administered 2020-12-22 – 2020-12-24 (×7): 25 mg via ORAL
  Filled 2020-12-22 (×7): qty 1

## 2020-12-22 MED ORDER — HALOPERIDOL LACTATE 5 MG/ML IJ SOLN: 2.0000 mg | Freq: Once | INTRAMUSCULAR | Status: AC

## 2020-12-22 NOTE — TOC Progression Note (Addendum)
Transition of Care [facility]) - Progression Note    Patient Details  Name: Margaret Mccann MRN: [id number] Date of Birth: 04-Nov-1970  Transition of Care [facility]) CM/SW Contact  Golda Acre, RN Phone Number: 12/22/2020, 8:28 AM  Clinical Narrative:    tct-judy Laural Benes mother-tct-mobile message left on cell phone. tcf-Judy Laural Benes will meet in patients room at 1030 to discuss toc needs.  Expected Discharge Plan: Home/Self Care Barriers to Discharge: Continued Medical Work up  Expected Discharge Plan and Services Expected Discharge Plan: Home/Self Care       Living arrangements for the past 2 months: Single Family Home                                       Social Determinants of Health (SDOH) Interventions    Readmission Risk Interventions No flowsheet data found.

## 2020-12-22 NOTE — Progress Notes (Signed)
eLink Physician-Brief Progress Note Patient Name: Reese Senk DOB: 1971/02/04 MRN: [id number]   Date of Service  12/22/2020  HPI/Events of Note  Agitation - Patient pulled out IV's. Request for bilateral soft wrist restraints and IV team consult.   eICU Interventions  Plan: 1. Bilateral soft wrist restraints X 5 hours.  2. IV team consult for difficult IV start.     Intervention Category Major Interventions: Delirium, psychosis, severe agitation - evaluation and management  Syriah Delisi Eugene 12/22/2020, 4:26 AM

## 2020-12-22 NOTE — Progress Notes (Signed)
eLink Physician-Brief Progress Note Patient Name: Margaret Mccann DOB: 1971/05/07 MRN: [id number]   Date of Service  12/22/2020  HPI/Events of Note  Agitation - Minimal improvement with Ativan. Nursing request for bilateral soft wrist and ankle restraints and additional sedation. QTc interval = 0.36 seconds.   eICU Interventions  Plan: 1. Bilateral soft wrist and ankle restraints X 3 hours. 2. Haldol 2 mg IV now.      Intervention Category Major Interventions: Delirium, psychosis, severe agitation - evaluation and management  Montavis Schubring Eugene 12/22/2020, 6:32 AM

## 2020-12-22 NOTE — Progress Notes (Addendum)
NAME:  Margaret Mccann, MRN:  [id number], DOB:  Nov 25, 1970, LOS: 5 ADMISSION DATE:  12/17/2020, CONSULTATION DATE:  12/17/2020 REFERRING MD:  ED physician, CHIEF COMPLAINT:  Admitted with altered mentation  BRIEF  50 y/o F with a history of bipolar disorder, alcohol abuse, cocaine, PTSD who presented to [facility] on 3/25 with reports of AMS & vomiting.  She was reportedly out drinking until 4am and came home intoxicated.  Her mother checked on her later in am & was found lethargic / altered with emesis.  EMS was activated.  patient had a seizure in route to the hospital.  She was intubated for airway protection, extubated on 3/27.  CXR significant for multifocal PNA / aspiration. The patient was placed on precedex gtt for significant agitation. This was weaned off 3/29.    Pertinent  Medical History  Morgellons syndrome  Bipolar 1 disorder  Depression and anxiety  Polysubstance abuse  Alcohol use   Significant Hospital Events: Including procedures, antibiotic start and stop dates in addition to other pertinent events    3/25 admitted and intubated. Unasyn started UDS positive for benzo and cocaine, ETOH < 10  3/26 Down to 40% fio2 on vent. Sedated with diprivan gtt (dropping bp) and fent gtt. On sedation wean -> nystagmus. Did follow command x 1 for RN  3/27 T max 102  3/29 Precedex drip off, afebrile overnight   3/30 Calm, off precedex  Interim History / Subjective:  Tmax 101 / WBC 11.3  I/O 2.3L UOP, - in last 24 hours  On RA  Pt asking about breakfast  Objective   Blood pressure (!) 135/96, pulse 70, temperature 98.2 F (36.8 C), temperature source Oral, resp. rate (!) 22, height 5\' 3"  (1.6 m), weight 76.7 kg, SpO2 95 %.        Intake/Output Summary (Last 24 hours) at 12/22/2020 0942 Last data filed at 12/22/2020 0911 Gross per 24 hour  Intake 1778.34 ml  Output 2525 ml  Net -746.66 ml   Filed Weights   12/17/20 2132 12/18/20 0425 12/19/20 0500  Weight: 69.4  kg 69.4 kg 76.7 kg    Examination: General: adult female lying in bed in NAD in NAD HEENT: MM pink/moist, anicteric, PERRL Neuro: Awake, alert, calm. Oriented to building type, self, month / president.  Appropriate, MAE.  CV: s1s2 RRR, no m/r/g PULM: non-labored on RA, lungs bilaterally clear GI: soft, bsx4 active  Extremities: warm/dry, no edema, bilateral wrist restraints in place Skin: no rashes or lesions        Resolved Hospital Problem list   Possible nystagumus Mild hypotension felt to be sedation related AKI resolved   Acute Respiratory acidosis  Transaminitis  Acute Hypoxic Resp failure -Self extubated 3/27  Assessment & Plan:   Aspiration Pneumonia  CXR with multifocal PNA right greater than left on admission  -pulmonary hygiene -IS, mobilize  -continue augmentin PO, D6 abx with stop date in place -[facility] elevated -aspiration precautions   Acute Metabolic Encephalopathy, improving Seizures   Seizure on admit, none since.  Head CT negative on admission.  -seizure precautions  -no AED's, suspect seizure related to substance abuse -follow neuro exam   Polysubstance, ETOH Abuse Bipolar Disorder with Prior Suicide Attempts  PTSD  Positive for cocaine, benzo's on admit.  Fatty liver on CT imaging noted.  -appreciate PSY assistance  -continue seroquel -librium with taper to off  -clonidine taper  -CIWA protocol in place  -continue thiamine, folate, MVI  -will need to  call PSY back when she can participate with interview (did not participate earlier in admit)  Severe tricuspid regurgitation  Echo 3/26 with EF 55 to 60% with severe tricuspid valve regurgitation -tele monitoring  -follow up with Cardiology as outpatient   Hypomagnesemia  Hypokalemia -follow electrolytes, replace as indicated -add Mg+ to am labs  -reduce scheduled KCL to 40 mEq daily now that she is taking PO's, 2 more days and stop    Anemia of critical illness -follow CBC  -transfuse  for Hgb <7%   Best practice (right click and "Reselect all SmartList Selections" daily)  Diet: Oral diet Pain/Anxiety/Delirium protocol: N/A VAP protocol (if indicated): Not indicated DVT prophylaxis: LMWH GI prophylaxis: N/A Glucose control:  SSI No Central venous access:  N/A Arterial line:  N/A Foley:  N/A  Mobility:  OOB  PT consulted: Yes Last date of multidisciplinary goals of care discussion: Mother, Pricilla Riffle, called for update 3/30. Message left for return call.  Code Status:  full code Disposition: SDU, transfer to [facility] as of 3/31.   ATTESTATION & SIGNATURE   Canary Brim, MSN, APRN, NP-C, AGACNP-BC [facility] 12/22/2020, 10:08 AM   Please see [email or web address] for pager details.   From 7A-7P if no response, please call [phone] After hours, please call ELink [phone]

## 2020-12-22 NOTE — Progress Notes (Signed)
Noted bed alarm sounded and patient was noted to be in the floor with CNA2 at bedside. Tech reported " pt was getting up and was assisted to [facility]. Upon completion  pt attempted to jump into bed at the foot of the bed and missed and fell to the floor. Pt assessment noted and no bruises or scraps noted. Pt denies making contact to floor with her head or bed. Pt assisted back to bed and is now tearful but denies pain or discomfort. Pt is aware of what happened and reports it was just an accident. Tele doctor notified and patients mother spoke with charge nurse and house supervisor. New orders obtained.

## 2020-12-22 NOTE — Progress Notes (Signed)
Patient alert and oriented to questions with moments of confusion through the shift. Anxiety and irritability increased throughout the night. In patient's moments of confusion she pulls off medical equipment many times and has made several attempts to get out of bed despite education and direction given to call out for help. Patient has removed 2 IVs including a midline recently placed by IV team. Patient found in bed smearing her stool on herself. Elink with CC called to request restraints and new IV access. Regretfully telly sitter is not sufficient to keep patient safe in ICU setting at this time. Restraints reapplied and IV team consulted.  Will continue to monitor patient closely.
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
## 2020-12-23 ENCOUNTER — Encounter (HOSPITAL_COMMUNITY): Payer: Self-pay | Admitting: Pulmonary Disease

## 2020-12-23 DIAGNOSIS — G929 Unspecified toxic encephalopathy: Secondary | ICD-10-CM

## 2020-12-23 DIAGNOSIS — F10231 Alcohol dependence with withdrawal delirium: Secondary | ICD-10-CM

## 2020-12-23 DIAGNOSIS — I071 Rheumatic tricuspid insufficiency: Secondary | ICD-10-CM

## 2020-12-23 DIAGNOSIS — F10239 Alcohol dependence with withdrawal, unspecified: Secondary | ICD-10-CM

## 2020-12-23 DIAGNOSIS — F10939 Alcohol use, unspecified with withdrawal, unspecified: Secondary | ICD-10-CM

## 2020-12-23 DIAGNOSIS — G9341 Metabolic encephalopathy: Secondary | ICD-10-CM

## 2020-12-23 DIAGNOSIS — F10931 Alcohol use, unspecified with withdrawal delirium: Secondary | ICD-10-CM

## 2020-12-23 HISTORY — DX: Unspecified convulsions: F10.239

## 2020-12-23 HISTORY — DX: Alcohol use, unspecified with withdrawal, unspecified: F10.939

## 2020-12-23 LAB — BASIC METABOLIC PANEL
Anion gap: 13 (ref 5–15)
BUN: 6 mg/dL (ref 6–20)
CO2: 20 mmol/L — ABNORMAL LOW (ref 22–32)
Calcium: 9.1 mg/dL (ref 8.9–10.3)
Chloride: 103 mmol/L (ref 98–111)
Creatinine, Ser: 0.52 mg/dL (ref 0.44–1.00)
GFR, Estimated: >60 mL/min (ref >60)
Glucose, Bld: 98 mg/dL (ref 70–99)
Potassium: 4.1 mmol/L (ref 3.5–5.1)
Sodium: 136 mmol/L (ref 135–145)

## 2020-12-23 LAB — CBC
HCT: 35.4 % — ABNORMAL LOW (ref 36.0–46.0)
Hemoglobin: 11.6 g/dL — ABNORMAL LOW (ref 12.0–15.0)
MCH: 30.9 pg (ref 26.0–34.0)
MCHC: 32.8 g/dL (ref 30.0–36.0)
MCV: 94.4 fL (ref 80.0–100.0)
Platelets: 395 10*3/uL (ref 150–400)
RBC: 3.75 MIL/uL — ABNORMAL LOW (ref 3.87–5.11)
RDW: 13.8 % (ref 11.5–15.5)
WBC: 9.3 10*3/uL (ref 4.0–10.5)
nRBC: 0 % (ref 0.0–0.2)

## 2020-12-23 LAB — GLUCOSE, CAPILLARY: Glucose-Capillary: 100 mg/dL — ABNORMAL HIGH (ref 70–99)

## 2020-12-23 MED ORDER — NICOTINE 14 MG/24HR TD PT24
14.0000 mg | MEDICATED_PATCH | Freq: Every day | TRANSDERMAL | Status: DC
  Administered 2020-12-23 – 2020-12-24 (×2): 14 mg via TRANSDERMAL
  Filled 2020-12-23 (×2): qty 1

## 2020-12-23 MED ORDER — BOOST / RESOURCE BREEZE PO LIQD CUSTOM
1.0000 | ORAL | Status: DC
  Administered 2020-12-23 – 2020-12-24 (×2): 1 via ORAL

## 2020-12-23 MED ORDER — QUETIAPINE FUMARATE 50 MG PO TABS
50.0000 mg | ORAL_TABLET | Freq: Two times a day (BID) | ORAL | Status: DC
  Administered 2020-12-23 – 2020-12-24 (×2): 50 mg via ORAL
  Filled 2020-12-23 (×2): qty 1

## 2020-12-23 NOTE — TOC Progression Note (Signed)
Transition of Care [facility]) - Progression Note    Patient Details  Name: Maurianna Benard MRN: [id number] Date of Birth: 10/17/1970  [facility]) CM/SW Contact  Golda Acre, RN Phone Number: 12/23/2020, 9:17 AM  Clinical Narrative:    Spoke to Mother and patient on [id number] in length about outcomes and post hospital care.  Patient has stated that she is open to going to inpatient etoh treatment at [facility].  Will give other resources as well.   Expected Discharge Plan: Home/Self Care Barriers to Discharge: Continued Medical Work up  Expected Discharge Plan and Services Expected Discharge Plan: Home/Self Care       Living arrangements for the past 2 months: Single Family Home                                       Social Determinants of Health (SDOH) Interventions    Readmission Risk Interventions No flowsheet data found.
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
## 2020-12-23 NOTE — Progress Notes (Signed)
Physical Therapy Treatment Patient Details Name: Margaret Mccann MRN: [id number] DOB: 10-19-1970 Today's Date: 12/23/2020    History of Present Illness 50 yo female with H/O bipolar, substance abuse, PTSD, admitted  12/17/20 with AMS, siezure on route to ED.  Intubated, then self extubated 12/19/20    PT Comments    Patient is more alert , follows directions more readily. Looking forward to mother's visit today. Patient ambulated x 100'. Gait  Is unsteady, requires Hand hold for support.   HR 89, 99%, BP 83/53 post walk. Continue PT for  Ambulation/balance.  Follow Up Recommendations  Home health PT;Supervision/Assistance - 24 hour     Equipment Recommendations  None recommended by PT    Recommendations for Other Services       Precautions / Restrictions Precautions Precautions: Fall Precaution Comments: monitor BP    Mobility  Bed Mobility   Bed Mobility: Supine to Sit;Sit to Supine     Supine to sit: Supervision Sit to supine: Supervision   General bed mobility comments: safety,    Transfers Overall transfer level: Needs assistance Equipment used: 1 person hand held assist;None Transfers: Sit to/from Stand Sit to Stand: Min guard         General transfer comment: patient tends to sway when standing/  Ambulation/Gait Ambulation/Gait assistance: Min assist Gait Distance (Feet): 100 Feet Assistive device: 1 person hand held assist;None Gait Pattern/deviations: Drifts right/left;Decreased stride length     General Gait Details: tends to sway each way, at times min assist  for balance during ambulation   Stairs             Wheelchair Mobility    Modified Rankin (Stroke Patients Only)       Balance Overall balance assessment: Needs assistance Sitting-balance support: Feet supported;Bilateral upper extremity supported Sitting balance-Leahy Scale: Fair     Standing balance support: Bilateral upper extremity supported;No upper  extremity supported Standing balance-Leahy Scale: Poor                              Cognition Arousal/Alertness: Awake/alert Behavior During Therapy: Flat affect Overall Cognitive Status: Impaired/Different from baseline Area of Impairment: Attention;Following commands                   Current Attention Level: Sustained   Following Commands: Follows one step commands consistently       General Comments: alert/oriented to place, year, not day of the week, with cue able to correct month      Exercises      General Comments        Pertinent Vitals/Pain Pain Assessment: Faces Faces Pain Scale: Hurts a little bit Pain Location: abdomen Pain Descriptors / Indicators: Discomfort Pain Intervention(s): Monitored during session    Home Living                      Prior Function            PT Goals (current goals can now be found in the care plan section) Progress towards PT goals: Progressing toward goals    Frequency           PT Plan Current plan remains appropriate    Co-evaluation              AM-PAC PT "6 Clicks" Mobility   Outcome Measure  Help needed turning from your back to your side while in a flat bed  without using bedrails?: None Help needed moving from lying on your back to sitting on the side of a flat bed without using bedrails?: None Help needed moving to and from a bed to a chair (including a wheelchair)?: A Little Help needed standing up from a chair using your arms (e.g., wheelchair or bedside chair)?: A Little Help needed to walk in hospital room?: A Little Help needed climbing 3-5 steps with a railing? : A Lot 6 Click Score: 19    End of Session Equipment Utilized During Treatment: Gait belt Activity Tolerance: Patient limited by fatigue Patient left: in bed;with call bell/phone within reach;with bed alarm set;with nursing/sitter in room Nurse Communication: Mobility status PT Visit Diagnosis:  Unsteadiness on feet (R26.81);Difficulty in walking, not elsewhere classified (R26.2)     Time: 1220-1240 PT Time Calculation (min) (ACUTE ONLY): 20 min  Charges:  $Gait Training: 8-22 mins                     Blanchard Kelch PT Acute Rehabilitation Services Pager [phone] Office [phone]     Rada Hay 12/23/2020, 1:36 PM

## 2020-12-23 NOTE — Hospital Course (Signed)
50 year old woman PMH alcohol abuse versus dependence, cocaine use, presented with seizure activity following an episode of intoxication.  Required mechanical ventilation for airway protection and was treated for aspiration pneumonia.  Self extubated 3/27 and tolerated.  Course complicated by significant delirium secondary alcohol withdrawal.  A & P  Alcohol withdrawal seizures, DT, acute toxic metabolic encephalopathy, delirium.  Required Precedex and sedation. --Seizures in the setting of alcohol withdrawal and substance abuse.  Not on antiepileptic at home.  Would not start 1 at this point. --Psychiatry recommended weaning off benzodiazepines and nightly starting on discharge.  Seroquel was added for confusion, agitation and psychosis. --Awake and alert, oriented, denies suicidal ideation.  Agitation has been initiated but may be resolved at this point.  Psychiatry consultation today to assess for candidacy for inpatient rehab as well as patient's capacity in regard to decision-making.  Alcoholism --Recommend rehab, permanent discontinuation.  Cocaine abuse --Recommend permanent cessation  Anemia of critical illness --Very mild.  No further evaluation suggested.  Severe tricuspid valve regurgitation --Asymptomatic.  Follow-up with cardiology as an outpatient.  Bipolar disorder, depression, PTSD --Per psychiatry  RESOLVED Acute hypoxic respiratory failure requiring ventilatory support for airway protection, acute respiratory acidosis Aspiration pneumonia

## 2020-12-23 NOTE — Progress Notes (Signed)
PROGRESS NOTE  Margaret Mccann [name] ACZ:[id number] DOB: 1971-02-24 DOA: 12/17/2020 PCP: Candice Camp, MD  Brief History   50 year old woman PMH alcohol abuse versus dependence, cocaine use, presented with seizure activity following an episode of intoxication.  Required mechanical ventilation for airway protection and was treated for aspiration pneumonia.  Self extubated 3/27 and tolerated.  Course complicated by significant delirium secondary alcohol withdrawal.  A & P  Alcohol withdrawal seizures, DT, acute toxic metabolic encephalopathy, delirium.  Required Precedex and sedation. --Seizures in the setting of alcohol withdrawal and substance abuse.  Not on antiepileptic at home.  Would not start 1 at this point. --Psychiatry recommended weaning off benzodiazepines and nightly starting on discharge.  Seroquel was added for confusion, agitation and psychosis. --Awake and alert, oriented, denies suicidal ideation.  Agitation has been initiated but may be resolved at this point.  Psychiatry consultation today to assess for candidacy for inpatient rehab as well as patient's capacity in regard to decision-making.  Alcoholism --Recommend rehab, permanent discontinuation.  Cocaine abuse --Recommend permanent cessation  Anemia of critical illness --Very mild.  No further evaluation suggested.  Severe tricuspid valve regurgitation --Asymptomatic.  Follow-up with cardiology as an outpatient.  Bipolar disorder, depression, PTSD --Per psychiatry  RESOLVED . Acute hypoxic respiratory failure requiring ventilatory support for airway protection, acute respiratory acidosis . Aspiration pneumonia   Nutritional Assessment: Body mass index is 29.95 kg/m.Marland Kitchen Seen by dietician.  I agree with the assessment and plan as outlined below: Nutrition Status: Nutrition Problem: Increased nutrient needs Etiology: acute illness Signs/Symptoms: estimated needs Interventions: Boost  Breeze,MVI  Disposition Plan:  Discussion:   Status is: Inpatient  Remains inpatient appropriate because:Inpatient level of care appropriate due to severity of illness   Dispo: The patient is from: Home              Anticipated d/c is to: TBD              Patient currently is not medically stable to d/c.   Difficult to place patient No  DVT prophylaxis: enoxaparin (LOVENOX) injection 40 mg Start: 12/17/20 2200 SCDs Start: 12/17/20 1937   Code Status: Full Code Level of care: Stepdown Family Communication: none but pt gave permission to discuss full medical details with mother  Brendia Sacks, MD  [facility] Direct contact: see [email or web address] (further directions at bottom of note if needed) 7PM-7AM contact night coverage as at bottom of note 12/23/2020, 11:36 AM  LOS: 6 days   Significant Hospital Events   . 3/25 presented with alcohol withdrawal and multiple seizures, intubated in the emergency department, admitted by critical care . 3/27 self extubation   Consults:  . Admitted by critical care . Psychiatry   Procedures:  . ETT 3/25 > 3/27  Significant Diagnostic Tests:  Marland Kitchen    Micro Data:  .    Antimicrobials:  .   Interval History/Subjective  CC: f/u alcohol w/d  Feels okay this morning.  No nausea or vomiting.  Had some breakfast.  No other complaints.  Not sure whether she wants to pursue rehab or not.  Denies suicidal or homicidal ideation.  Objective   Vitals:  Vitals:   12/23/20 0900 12/23/20 1100  BP: (!) 126/103   Pulse: 85   Resp: 20   Temp:  98.7 F (37.1 C)  SpO2: 95%     Exam:  Constitutional:   . Appears calm and comfortable lying in bed.  Sitter at bedside. ENMT:  . grossly normal hearing  Respiratory:  . CTA bilaterally, no w/r/r.  . Respiratory effort normal.  Cardiovascular:  . RRR, no m/r/g . No LE extremity edema   Psychiatric:  . Mental status o Mood, affect appropriate o Orientated to self, location, month, day,  year, reason for hospitalization  I have personally reviewed the following:   Today's Data  . Basic metabolic panel and CBC unremarkable  Scheduled Meds: . amoxicillin-clavulanate  1 tablet Oral Q12H  . chlordiazePOXIDE  25 mg Oral Q8H  . Chlorhexidine Gluconate Cloth  6 each Topical Daily  . cloNIDine  0.1 mg Oral QAC breakfast  . enoxaparin (LOVENOX) injection  40 mg Subcutaneous Q24H  . famotidine  20 mg Oral BID  . feeding supplement  1 Container Oral Q24H  . folic acid  1 mg Oral Daily  . multivitamin with minerals  1 tablet Oral Daily  . potassium chloride  40 mEq Oral Daily  . QUEtiapine  25 mg Oral BID  . thiamine  100 mg Oral Daily   Or  . thiamine  100 mg Intravenous Daily   Continuous Infusions:  Principal Problem:   Delirium tremens (HCC) Active Problems:   Cocaine abuse (HCC)   Seizure (HCC)   Acute respiratory failure with hypoxemia (HCC)   Benzodiazepine abuse (HCC)   Encephalopathy acute   AKI (acute kidney injury) (HCC)   Tricuspid valve regurgitation   Acute respiratory acidosis   Aspiration pneumonia (HCC)   Alcohol withdrawal seizure with complication, with unspecified complication (HCC)   Acute metabolic encephalopathy   Toxic encephalopathy   LOS: 6 days   How to contact the [facility] Attending or Consulting provider 7A - 7P or covering provider during after hours 7P -7A, for this patient?  1. Check the care team in [facility] and look for a) attending/consulting TR[facility] provider listed and b) the [facility] team listed 2. Log into [email or web address] and use [place]'s universal password to access. If you do not have the password, please contact the hospital operator. 3. Locate the [facility] provider you are looking for under [facility] and page to a number that you can be directly reached. 4. If you still have difficulty reaching the provider, please page the [facility] (Director on Call) for the Hospitalists listed on amion for assistance.

## 2020-12-23 NOTE — Progress Notes (Signed)
Nutrition Follow-up  RD working remotely.   DOCUMENTATION CODES:   Not applicable  INTERVENTION:  - will order Boost Breeze BID, each supplement provides 250 kcal and 9 grams of protein.   NUTRITION DIAGNOSIS:   Increased nutrient needs related to acute illness as evidenced by estimated needs. -revised, ongoing  GOAL:   Patient will meet greater than or equal to 90% of their needs -minimally met on average   MONITOR:   PO intake,Supplement acceptance,Labs,Weight trends  ASSESSMENT:   50 yo female admitted with AMS requiring intubation. PMH includes bipolar disorder, alcohol abuse, PTSD, Morgellons syndrome.  She was extubated on 3/27 at 0915 and diet advanced from NPO to Regular on 3/29 at 0927. Documented intakes since that time were 100% of breakfast, 85% of lunch, and 80% of dinner on 3/29; 100% of breakfast and 100% of dinner on 3/30.   Estimated nutrition needs updated at this time. Weight on 3/25 was 156 lb and weight on 3/27 was 169 lb; no weight recorded since 3/27. Mild pitting edema to BUE documented in the edema section of flow sheet.   Patient with hx of alcohol abuse noted to be in withdrawal this admission. Precedex is now off.   Labs reviewed; CBGs: 79, 101, 100 mg/dl. Medications reviewed; 20 mg oral pepcid BID, 1 mg folvite/day, 1 tablet multivitamin with minerals/day, 40 mEq Klor-Con x2 doses 3/31, 100 mg thiamine/day.    Diet Order:   Diet Order            Diet regular Room service appropriate? Yes; Fluid consistency: Thin  Diet effective now                 EDUCATION NEEDS:   Not appropriate for education at this time  Skin:  Skin Assessment: Reviewed RN Assessment  Last BM:  no BM documented  Height:   Ht Readings from Last 1 Encounters:  12/17/20 _0  (1.6 m)    Weight:   Wt Readings from Last 1 Encounters:  12/19/20 76.7 kg     Estimated Nutritional Needs:  Kcal:  1650-1850 kcal Protein:  80-90 grams Fluid:  >/= 1.6  L      Jarome Matin, MS, RD, LDN, CNSC Inpatient Clinical Dietitian RD pager # available in AMION  After hours/weekend pager # available in [facility]

## 2020-12-24 LAB — CREATININE, SERUM
Creatinine, Ser: 0.58 mg/dL (ref 0.44–1.00)
GFR, Estimated: >60 mL/min (ref >60)

## 2020-12-24 LAB — GLUCOSE, CAPILLARY: Glucose-Capillary: 101 mg/dL — ABNORMAL HIGH (ref 70–99)

## 2020-12-24 MED ORDER — FOLIC ACID 1 MG PO TABS: 1.0000 mg | ORAL_TABLET | Freq: Every day | ORAL | Status: DC

## 2020-12-24 MED ORDER — THIAMINE HCL 100 MG PO TABS: 100.0000 mg | ORAL_TABLET | Freq: Every day | ORAL | Status: DC

## 2020-12-24 MED ORDER — FLUOXETINE HCL 20 MG PO CAPS
40.0000 mg | ORAL_CAPSULE | Freq: Every day | ORAL | Status: DC
  Administered 2020-12-24: 40 mg via ORAL
  Filled 2020-12-24: qty 2

## 2020-12-24 MED ORDER — CLONIDINE HCL 0.1 MG PO TABS
0.2000 mg | ORAL_TABLET | Freq: Every day | ORAL | Status: DC
Start: 2020-12-24 — End: 2020-12-25

## 2020-12-24 MED ORDER — ADULT MULTIVITAMIN W/MINERALS CH: 1.0000 | ORAL_TABLET | Freq: Every day | ORAL | Status: DC

## 2020-12-24 MED ORDER — MIRTAZAPINE 15 MG PO TABS: 30.0000 mg | ORAL_TABLET | Freq: Every day | ORAL | Status: DC

## 2020-12-24 MED ORDER — GABAPENTIN 300 MG PO CAPS
1200.0000 mg | ORAL_CAPSULE | Freq: Two times a day (BID) | ORAL | Status: DC
  Administered 2020-12-24: 1200 mg via ORAL
  Filled 2020-12-24: qty 4

## 2020-12-24 MED ORDER — CHLORDIAZEPOXIDE HCL 25 MG PO CAPS: ORAL_CAPSULE | ORAL | 0 refills | Status: DC

## 2020-12-24 NOTE — Discharge Instructions (Signed)
Alcohol Withdrawal Syndrome Alcohol withdrawal syndrome is a group of symptoms that can develop when a person who drinks heavily and regularly stops drinking or drinks less. Alcohol withdrawal syndrome can be mild or severe, and it may even be life-threatening. Alcohol withdrawal syndrome usually affects people who have alcohol use disorder, which may also be called alcoholism. Alcohol use disorder is when a person is unable to control his or her alcohol use, and drinking too much or too often causes problems at home, at work, or in relationships. What are the causes? Drinking heavily and drinking on a regular basis cause changes in brain chemistry. Over time, the body becomes dependent on alcohol. When alcohol use stops, the chemistry system in the brain becomes unbalanced and causes the symptoms of alcohol withdrawal. What increases the risk? Alcohol withdrawal syndrome is more likely to occur in people who drink more than the recommended limit of alcohol (2 drinks a day for men or 1 drink a day for non-pregnant women). It is also more likely to affect heavy drinkers who have been using alcohol for long periods of time. The more a person drinks and the longer he or she drinks, the greater the risk of alcohol withdrawal syndrome. Severe withdrawal is more likely to develop in someone who:  Had severe alcohol withdrawal in the past.  Had a seizure during a previous episode of alcohol withdrawal.  Is elderly.  Uses other drugs.  Has a long-term (chronic) medical problem, such as heart, lung, or liver disease.  Has depression.  Does not get enough nutrients from his or her diet (malnutrition). What are the signs or symptoms? Symptoms of this condition can be mild to moderate, or they can be severe. Symptoms may develop a few hours (or up to a day) after a person changes his or her drinking patterns. During the 48 hours after he or she has stopped drinking, the following symptoms may go away or  get better:  Uncontrollable shaking (tremor).  Sweating.  Headache.  Anxiety.  Inability to relax (agitation).  Trouble sleeping (insomnia).  Irregular heartbeats (palpitations).  Alcohol cravings.  Seizure. The following symptoms may get worse 24-48 hours after a person has decreased or stopped alcohol use, and they may gradually improve over a period of days or weeks:  Nausea and vomiting.  Fatigue.  Sensitivity to light and sounds.  Confusion and inability to think clearly.  Loss of appetite.  Mood swings, irritability, depression, and anxiety.  Insomnia and nightmares. The following symptoms are severe and life-threatening. When these symptoms occur together, they are called delirium tremens (DTs):  High blood pressure.  Increased heart rate.  Trouble breathing.  Seizures. These may go away along with other symptoms, or they may persist.  Seeing, hearing, feeling, smelling, or tasting things that are not there (hallucinations). If you experience hallucinations, they usually begin 12-24 hours after a change in drinking patterns. Delirium tremens requires immediate hospitalization. How is this diagnosed? This condition may be diagnosed based on:  Your symptoms and medical history.  Your history of alcohol use. Your health care provider may ask questions about your drinking behavior. It is important to be honest when you answer these questions.  A psychological assessment.  A physical exam.  Blood tests or urine tests to measure blood alcohol level and to rule out other causes of symptoms.  MRI or CT scan. This may be done if you seem to have abnormal thinking or behaviors (altered mental status). Diagnosis can be difficult.  People going through withdrawal often avoid seeking medical care and are not thinking clearly. Friends and family members play an important role in recognizing symptoms and encouraging loved ones to get treatment. How is this  treated? Most people with symptoms of withdrawal can be treated outside of a hospital setting (outpatient treatment), with close monitoring such as daily check-ins with a health care provider and counseling. You may need treatment at a hospital or treatment center (inpatient treatment) if:  You have a history of delirium tremens or seizures.  You have severe symptoms.  You are addicted to other drugs.  You cannot swallow medicine.  You have a serious medical condition such as heart failure.  You experienced withdrawal in the past but then you continued drinking alcohol.  You are not likely to commit to an outpatient treatment schedule. Treatment may involve:  Monitoring your blood pressure, pulse, and breathing.  IV fluids to keep you hydrated.  Medicines to reduce withdrawal symptoms and discomfort (benzodiazepines).  Medicine to reduce anxiety.  Medicine to prevent or control seizures.  Multivitamins and B vitamins.  Having a health care provider check on you daily. It is important to get treatment for alcohol withdrawal early. Getting treatment early can:  Speed up your recovery from withdrawal symptoms.  Make you more successful with long-term stoppage of alcohol use (sobriety). If you need help to stop drinking, your health care provider may recommend a long-term treatment plan that includes:  Medicines to help treat alcohol use disorder.  Substance abuse counseling.  Support groups. Follow these instructions at home:  Take over-the-counter and prescription medicines (including vitamin supplements) only as told by your health care provider.  Do not drink alcohol.  Do not drive until your health care provider approves.  Have someone you trust stay with you or be available if you need help with your symptoms or with not drinking.  Drink enough fluid to keep your urine pale yellow.  Consider joining an alcohol support group or treatment program. These can  provide emotional support, advice, and guidance.  Keep all follow-up visits as told by your health care provider. This is important.   Contact a health care provider if:  Your symptoms get worse instead of better.  You cannot eat or drink without vomiting.  You are struggling with not drinking alcohol.  You cannot stop drinking alcohol. Get help right away if:  You have an irregular heartbeat.  You have chest pain.  You have trouble breathing.  You have a seizure for the first time.  You hallucinate.  You become very confused. Summary  Alcohol withdrawal is a group of symptoms that can develop when a person who drinks heavily and regularly stops drinking or drinks less.  Symptoms of this condition can be mild to moderate, or they can be severe.  Treatment may include hospitalization, medicine, and counseling. This information is not intended to replace advice given to you by your health care provider. Make sure you discuss any questions you have with your health care provider. Document Revised: 08/24/2017 Document Reviewed: 05/18/2017 Elsevier Patient Education  2021 [employer]vier Inc.   Delirium Tremens Delirium tremens, also called DTs, is the worst kind of alcohol withdrawal. It happens when you stop drinking or you drink less than usual. DTs is an emergency. You need to be treated in a hospital or a treatment center right away. What are the causes? DTs happens when you drink a lot and often and then stop drinking. It can also  happen if you drink less than you usually drink. What increases the risk? The more a person drinks and the longer he or she drinks, the greater the risk of DTs. This condition is more likely to develop in those who have a history of drinking heavily and:  Have had alcohol withdrawal or DTs in the past.  Have had a seizure during past alcohol withdrawal.  Are elderly.  Are pregnant.  Use drugs.  Take medicines to help you relax  (sedatives).  Have medical problems, including: ? Infections. ? Heart, lung, or liver disease. ? Seizures. ? Behavioral health conditions. What are the signs or symptoms? Early symptoms of alcohol withdrawal happen before symptoms of DTs. These symptoms may:  Start within 6 hours after the most recent drink.  Last for 5-7 days. Early symptoms may include:  Problems sleeping  Problems thinking clearly.  Feeling worried or nervous (anxious).  Feeling moodier and more upset than usual.  Fast breathing.  Headache.  Feeling sick to your stomach (nauseous) and vomiting.  Fever.  Sweating.  A heartbeat that feels faster than normal.  A heartbeat that feels like it skips a beat or flutters.  Shaking (tremor) of a body part. Symptoms of DTs may:  Start to develop 3 days after the most recent drink.  Last for up to 8 days. Symptoms include:  Changes in attention and awareness.  Changes in heart rate, temperature, blood pressure, and oxygen levels. Most often, the heart rate and body temperature will rise.  Feeling or seeing things that are not really there (hallucinations).  Extreme tiredness and not being able to wake up.  Extreme confusion.  Seizures. How is this treated? Treatment for this condition is done in a hospital or treatment center. It may include:  Monitoring your temperature, heart rate, blood pressure, and oxygen.  Fluids and minerals given through an IV.  Medicines to help you relax.  Medicines that help you worry less.  Medicines to control your blood pressure.  Medicines to prevent or control seizures.  Vitamins.  Nutrition by mouth or through an IV.  Medicines that help you feel less sick to your stomach.  Talking to a counselor. Follow these instructions at home: Medicines  Take over-the-counter and prescription medicines only as told by your doctor.  If you are prescribed medicines to help you relax, take them only as told  by your doctor. Eating and drinking  Do not drink alcohol.  Drink enough fluid to keep your pee (urine) pale yellow.   General instructions  Do not drive or use heavy machinery until your doctor approves.  If you drink a lot and often, do not try to stop drinking without help from your doctor.  Have someone stay with you in case you need help.  Think about joining an alcohol support group.  Keep all follow-up visits as told by your doctor. This is important. Contact a doctor if:  You have symptoms of alcohol withdrawal that get worse or do not go away.  You feel sad (depressed).  You drink alcohol even though you were trying not to.  You cannot eat or drink without vomiting.  You are not able to stop drinking alcohol. Get help right away if:  You feel you may hurt yourself or others.  You have a seizure.  You have a fever.  You get very confused.  You feel or see things that are not there.  You are very sleepy and have trouble staying awake.  You vomit blood.  You shake or tremble very badly.  You sweat a lot.  You feel like your heart is beating too fast, skips a beat, or flutters.  You have chest pain.  You have trouble breathing. If you ever feel like you may hurt yourself or others, or have thoughts about taking your own life, get help right away. You can go to your nearest emergency department or call:  Your local emergency services (911 in the [place]  A suicide crisis helpline, such as the National Suicide Prevention Lifeline at 8086868871. This is open 24 hours a day. These symptoms may be an emergency. Do not wait to see if the symptoms will go away. Get medical help right away. Call your local emergency services (911 in the [place] Do not drive yourself to the hospital. Summary  DTs is the worst kind of alcohol withdrawal. It happens when you stop drinking or you drink less.  DTs can be life-threatening.  You need to be treated in a  hospital or a treatment center right away.  The more a person drinks and the longer he or she drinks, the greater the risk of DTs.  Contact a doctor if you drink alcohol even though you were trying not to. This information is not intended to replace advice given to you by your health care provider. Make sure you discuss any questions you have with your health care provider. Document Revised: 09/30/2018 Document Reviewed: 09/30/2018 Elsevier Patient Education  2021 [employer].

## 2020-12-24 NOTE — TOC Progression Note (Signed)
Transition of Care [facility]) - Progression Note    Patient Details  Name: Margaret Mccann MRN: [id number] Date of Birth: 12-16-1970  Transition of Care [facility]) CM/SW Contact  Golda Acre, RN Phone Number: 12/24/2020, 9:54 AM  Clinical Narrative:    Spoke with the patient at length about post hospital inpatient rehab.  Wants to go to [facility] here in [place].  She is calling to see if they can accept her as an inpatient.   Expected Discharge Plan: Home/Self Care Barriers to Discharge: Continued Medical Work up  Expected Discharge Plan and Services Expected Discharge Plan: Home/Self Care       Living arrangements for the past 2 months: Single Family Home                                       Social Determinants of Health (SDOH) Interventions    Readmission Risk Interventions No flowsheet data found.

## 2020-12-24 NOTE — Progress Notes (Signed)
Noted through out the night pt continued to exhibit impulsive behavior by jumpimg up crawling over the side rails. Pt required frequent re-orientation and appeared to become very frustrated. Noted pt became more restless as the night went on, however did have bouts of falling asleep in intervals of 30-45 minutes. Report given to on coming shift.r

## 2020-12-24 NOTE — Progress Notes (Signed)
Occupational Therapy Treatment Patient Details Name: Margaret Mccann MRN: [id number] DOB: [birth year] Today's Date: 12/24/2020    History of present illness 50 yo female with H/O bipolar, substance abuse, PTSD, admitted  12/17/20 with AMS, siezure on route to ED.  Intubated, then self extubated 12/19/20   OT comments  Patient reports feeling much more like herself today, "I didn't know where I was yesterday." Patient following directions appropriately, no impulsivity observed this session. Patient supervision level for functional transfers and ambulation in room, does steady self on furniture "I feel a little unsteady still." Blood pressure remained stable with OOB activity. Patient is hopeful she can D/C to ETOH rehab in [place] today. Patient progressing well, will continue to follow.    Follow Up Recommendations  No OT follow up;Supervision/Assistance - 24 hour (initial)    Equipment Recommendations  None recommended by OT       Precautions / Restrictions Precautions Precautions: Fall Precaution Comments: monitor BP       Mobility Bed Mobility Overal bed mobility: Modified Independent                  Transfers Overall transfer level: Needs assistance Equipment used: None Transfers: Sit to/from Stand Sit to Stand: Supervision         General transfer comment: no physical assistance    Balance Overall balance assessment: Mild deficits observed, not formally tested                                         ADL either performed or assessed with clinical judgement   ADL Overall ADL's : Needs assistance/impaired Eating/Feeding: Independent;Sitting                   Lower Body Dressing: Independent;Sitting/lateral leans Lower Body Dressing Details (indicate cue type and reason): pulling up socks at edge of bed Toilet Transfer: Supervision/safety;Ambulation Toilet Transfer Details (indicate cue type and reason): patient reports  still feeling a little unsteady, holding onto furniture when ambulating in room. no loss of balance or physical assist needed         Functional mobility during ADLs: Supervision/safety                 Cognition Arousal/Alertness: Awake/alert Behavior During Therapy: WFL for tasks assessed/performed Overall Cognitive Status: Within Functional Limits for tasks assessed                                 General Comments: patient reports she feels like herself today "I didn't know where I was yesterday" able to follow directions appropriately, not impulsive this session              General Comments BP remain stable during mobility    Pertinent Vitals/ Pain       Pain Assessment: No/denies pain         Frequency  Min 2X/week        Progress Toward Goals  OT Goals(current goals can now be found in the care plan section)  Progress towards OT goals: Progressing toward goals  Acute Rehab OT Goals Patient Stated Goal: go to ETOH rehab OT Goal Formulation: With patient Time For Goal Achievement: 01/04/21 Potential to Achieve Goals: Good ADL Goals Pt Will Perform Upper Body Dressing: Independently;sitting Pt Will Perform Lower Body Dressing: Independently;sit to/from  stand;sitting/lateral leans Pt Will Transfer to Toilet: Independently;ambulating;regular height toilet Pt Will Perform Toileting - Clothing Manipulation and hygiene: Independently;sit to/from stand Additional ADL Goal #1: patient will follow 2 step directions with no cues in order to safely participate in daily routine.  Plan Discharge plan remains appropriate       AM-PAC OT "6 Clicks" Daily Activity     Outcome Measure   Help from another person eating meals?: None Help from another person taking care of personal grooming?: A Little Help from another person toileting, which includes using toliet, bedpan, or urinal?: A Little Help from another person bathing (including washing, rinsing,  drying)?: A Little Help from another person to put on and taking off regular upper body clothing?: A Little Help from another person to put on and taking off regular lower body clothing?: A Little 6 Click Score: 19    End of Session  OT Visit Diagnosis: Unsteadiness on feet (R26.81)   Activity Tolerance Patient tolerated treatment well   Patient Left in bed;with call bell/phone within reach   Nurse Communication Mobility status        Time: 4818-5631 OT Time Calculation (min): 15 min  Charges: OT General Charges $OT Visit: 1 Visit OT Treatments $Self Care/Home Management : 8-22 mins  Marlyce Huge OT OT pager: [phone]   Carmelia Roller 12/24/2020, 2:11 PM

## 2020-12-24 NOTE — Progress Notes (Signed)
Pt refused to have her vital signs monitored. Patient was educated on needing to be monitored until discharge. Pt continued to refuse monitoring. Pt was discharged with parents. Patients belongings with her. Patient education and discharge instructions provided and patient had no further questions.

## 2020-12-24 NOTE — Progress Notes (Signed)
Ambulatory referral to Cardiology    [facility]  [phone]   [address]  [place] [address]    Consultation    Patient's mother to call back to confirm this information is in printed AVS paperwork.

## 2020-12-24 NOTE — Progress Notes (Signed)
Chaplain engaged in an initial visit with Margaret Mccann before she was discharging.  Margaret Mccann recounted her life and struggles with drug addiction.  She stated that she has not only experienced the death of her sister, but also a fiance and her father.  Margaret Mccann stated that she has a lot of PTSD from those traumas.  Margaret Mccann also talked about her two children and her want to be sober for them.  When chaplain arrived [name] discussed going to a treatment center that she knows will help her but that it was too expensive.  By the time chaplain was leaving the treatment center called and told her she had been accepted into the program.  Margaret Mccann and her parents rejoiced and expressed their hope.  Chaplain also spent time talking with Margaret Mccann's parents about what they have endured through Margaret Mccann's substance abuse and that she has found it hard to recover from her grief.  Margaret Mccann's mom detailed that she becomes violent when she drinks and does drugs.  She also noted that her daughter has been to about 7-8 facilities and programs for sobriety.  Her mom stated that she will never give up on Margaret Mccann and expressed that she believes this time will be different.     Chaplain offered listening, presence, and support and affirmed the strength of both Margaret Mccann and her parents.     12/24/20 1600  Clinical Encounter Type  Visited With Patient and family together  Visit Type Initial

## 2020-12-24 NOTE — Discharge Summary (Addendum)
Physician Discharge Summary  Margaret Mccann Margaret Mccann CHE:[id number] DOB: [birth year] DOA: 12/17/2020  PCP: Candice Camp, MD  Psychiatrist: Dr. Zena Amos  Admit date: 12/17/2020 Discharge date: 12/24/2020  Recommendations for Outpatient Follow-up:   Alcohol withdrawal seizures, DT, acute toxic metabolic encephalopathy, delirium.   --Psychiatry recommended weaning off benzodiazepines.  This was discussed with the patient and she concurred.  Patient reports she is out of Klonopin that she was recently prescribed March 10.  Mother confirms this at the bedside and I have no reason to doubt this.  Discussed with [facility], she has a refill on file but cannot be refilled earlier than April 7.  I will coordinate with patient psychiatrist Dr. Evelene Croon.  Shortly per milligram provided.  Discussed in detail with patient and mother this cannot be mixed with alcohol or taking with chronic pain or any benzodiazepines.  Discussed that menses could result in death and patient and mother understand.  Mother encouraged to take custody of medications.  Alcoholism --Recommend rehab, permanent discontinuation.  Cocaine abuse --Recommend permanent cessation.   Severe tricuspid valve regurgitation --Ambulatory referral to cardiology   Follow-up Information    Candice Camp, MD Follow up.   Specialty: Obstetrics and Gynecology Contact information: [address] [place], [address] [place] [address] [phone]        Zena Amos, MD. Schedule an appointment as soon as possible for a visit in 1 week(s).   Specialty: Psychiatry Contact information: [address] [place] [address] [phone]             Discharge Diagnoses: Principal diagnosis is #1 Principal Problem:   Delirium tremens [facility]) Active Problems:   Cocaine abuse (HCC)   Seizure (HCC)   Acute respiratory failure with hypoxemia (HCC)   Benzodiazepine abuse (HCC)   Encephalopathy acute   AKI (acute kidney injury)  (HCC)   Tricuspid valve regurgitation   Acute respiratory acidosis   Aspiration pneumonia (HCC)   Alcohol withdrawal seizure with complication, with unspecified complication (HCC)   Acute metabolic encephalopathy   Toxic encephalopathy   Discharge Condition: improved Disposition: home  Diet recommendation:  Diet Orders (From admission, onward)    Start     Ordered   12/24/20 0000  Diet general        12/24/20 1548   12/21/20 0927  Diet regular Room service appropriate? Yes; Fluid consistency: Thin  Diet effective now       Question Answer Comment  Room service appropriate? Yes   Fluid consistency: Thin      12/21/20 0929           Filed Weights   12/18/20 0425 12/19/20 0500 12/24/20 0500  Weight: 69.4 kg 76.7 kg 76.4 kg    HPI/Hospital Course:   50 year old woman PMH alcohol abuse versus dependence, cocaine use, presented with seizure activity following an episode of intoxication.  Required mechanical ventilation for airway protection and was treated for aspiration pneumonia.  Self extubated 3/27 and tolerated.  Course complicated by significant delirium secondary alcohol withdrawal.  Condition gradually improved, mental status stable, medically stable, seen by psychiatry, cleared for discharge home, recommend inpatient substance abuse rehab, patient will proceed.  Discussed in detail with mother at bedside by patient's request.  Alcohol withdrawal seizures, DT, acute toxic metabolic encephalopathy, delirium.  Required Precedex and sedation. --Seizures in the setting of alcohol withdrawal and substance abuse.  Not on antiepileptic at home.  Would not start one at this point. --Psychiatry recommended weaning off benzodiazepines.  This was discussed with the patient and she concurred.  Patient reports she is out of Klonopin that she was recently prescribed March 10.  Mother confirms this at the bedside and I have no reason to doubt this.  Discussed with [facility], she has a  refill on file but cannot be refilled earlier than April 7.  I will coordinate with patient psychiatrist Dr. Evelene Croon. --Awake and alert, oriented, denies suicidal ideation.  Cleared by psychiatry for discharge.  Patient seems committed to seeking treatment. --pt reported Tardive dyskinesia w/ Seroquel in the past and requested to stop on discharge.  Alcoholism --Recommend rehab, permanent discontinuation.  Cocaine abuse --Recommend permanent cessation.  Anemia of critical illness --Very mild.  No further evaluation suggested.  Severe tricuspid valve regurgitation --Asymptomatic.  Follow-up with cardiology as an outpatient.  Bipolar disorder, depression, PTSD --Per psychiatry  RESOLVED . Acute hypoxic respiratory failure requiring ventilatory support for airway protection, acute respiratory acidosis . Aspiration pneumonia     Significant Hospital Events    3/25 presented with alcohol withdrawal and multiple seizures, intubated in the emergency department, admitted by critical care  3/27 self extubation  Consults:   Admitted by critical care  Psychiatry  Procedures:   ETT 3/25 > 3/27   Today's assessment: S: CC: f/u DTs  Feels well, no complaints No concerns per nursing No SI/HI  O: Vitals:  Vitals:   12/24/20 0800 12/24/20 1200  BP: 121/78   Pulse: 84   Resp: 11   Temp: 98.3 F (36.8 C) 97.6 F (36.4 C)  SpO2: 98%     Constitutional:  . Appears calm and comfortable ENMT:  . grossly normal hearing  Respiratory:  . CTA bilaterally, no w/r/r.  . Respiratory effort normal.  Cardiovascular:  . RRR, no m/r/g . No LE extremity edema   Psychiatric:  . judgment and insight appear normal . Mental status o Mood, affect appropriate o Orientated to self, [facility] hospital, December 24, 2020, reason for hospitalization  CBG stable  Discharge Instructions  Discharge Instructions    Ambulatory referral to Cardiology   Complete by: As directed    Severe  tricuspid regurg   Diet general   Complete by: As directed    Discharge instructions   Complete by: As directed    Call your physician or seek immediate medical attention for confusion, falls, seizures or worsening of condition. Do not drink any alcohol. You have been prescribed a new medication called Librium, take as instructed for the next 4 days. Do not take Klonopin or any other benzodiazepine. Do not refill your Klonopin. Discuss your care with your psychiatrist next week. Do not take any drugs unless prescribed by your physician. Mixing librium with alcohol can be deadly.   Increase activity slowly   Complete by: As directed      Allergies as of 12/24/2020      Reactions   Lamictal [lamotrigine] Anaphylaxis, Rash, Other (See Comments)   Stevens-Johnson syndrome and fevers, also   Lamictal [lamotrigine] Anaphylaxis, Rash, Other (See Comments)   Fevers and Stevens-Johnson syndrome, also   Tramadol Other (See Comments)   Pt had a seizure after taking Tramadol!!   Sulfamethoxazole-trimethoprim Rash   Erythromycin Nausea And Vomiting   Erythromycin Base Nausea And Vomiting   Geodon [ziprasidone Hcl] Rash   Levetiracetam Anxiety   Sulfa Antibiotics Rash      Medication List    STOP taking these medications   acetaminophen 500 MG tablet Commonly known as: TYLENOL  clonazePAM 0.5 MG tablet Commonly known as: KlonoPIN     TAKE these medications   chlordiazePOXIDE 25 MG capsule Commonly known as: LIBRIUM Take 1 capsule (25 mg total) by mouth in the morning and at bedtime for 2 days, THEN 1 capsule (25 mg total) daily for 2 days. Start taking on: December 24, 2020   cloNIDine 0.2 MG tablet Commonly known as: CATAPRES Take 1 tablet (0.2 mg total) by mouth at bedtime.   FLUoxetine 20 MG capsule Commonly known as: PROZAC Take 3 capsules (60 mg total) by mouth daily.   folic acid 1 MG tablet Commonly known as: FOLVITE Take 1 tablet (1 mg total) by mouth daily. Start taking on:  December 25, 2020   gabapentin 600 MG tablet Commonly known as: NEURONTIN Take 2 tablets (1,200 mg total) by mouth 2 (two) times daily.   hydrOXYzine 25 MG tablet Commonly known as: ATARAX/VISTARIL Take 1 tablet (25 mg total) by mouth 3 (three) times daily as needed for anxiety.   mirtazapine 30 MG tablet Commonly known as: REMERON Take 1 tablet (30 mg total) by mouth at bedtime.   multivitamin with minerals Tabs tablet Take 1 tablet by mouth daily. Start taking on: December 25, 2020   thiamine 100 MG tablet Take 1 tablet (100 mg total) by mouth daily. Start taking on: December 25, 2020      Allergies  Allergen Reactions  . Lamictal [Lamotrigine] Anaphylaxis, Rash and Other (See Comments)    Stevens-Johnson syndrome and fevers, also  . Lamictal [Lamotrigine] Anaphylaxis, Rash and Other (See Comments)    Fevers and Stevens-Johnson syndrome, also  . Tramadol Other (See Comments)    Pt had a seizure after taking Tramadol!!  . Sulfamethoxazole-Trimethoprim Rash  . Erythromycin Nausea And Vomiting  . Erythromycin Base Nausea And Vomiting  . Geodon [Ziprasidone Hcl] Rash  . Levetiracetam Anxiety  . Sulfa Antibiotics Rash    The results of significant diagnostics from this hospitalization (including imaging, microbiology, ancillary and laboratory) are listed below for reference.    Significant Diagnostic Studies: DG Chest 2 View  Result Date: 12/01/2020 CLINICAL DATA:  Chest pain and shortness of breath for 1 week EXAM: CHEST - 2 VIEW COMPARISON:  08/14/2020 FINDINGS: The heart size and mediastinal contours are within normal limits. Both lungs are clear. The visualized skeletal structures are unremarkable. IMPRESSION: No active cardiopulmonary disease. Electronically Signed   By: Alcide Clever M.D.   On: 12/01/2020 18:47   DG Abd 1 View  Result Date: 12/17/2020 CLINICAL DATA:  OG tube placement. EXAM: ABDOMEN - 1 VIEW COMPARISON:  Radiograph earlier this day. FINDINGS: Enteric tube is  been retracted, the tip and side port remain below the diaphragm in the stomach. There is mild gaseous gastric distension. No small bowel dilatation in the upper abdomen. IMPRESSION: Enteric tube has been retracted, tip and side-port remain below the diaphragm in the stomach. Mild gaseous gastric distension. Electronically Signed   By: Narda Rutherford M.D.   On: 12/17/2020 22:07   CT Head Wo Contrast  Result Date: 12/17/2020 CLINICAL DATA:  Altered mental status.  Possible overdose. EXAM: CT HEAD WITHOUT CONTRAST TECHNIQUE: Contiguous axial images were obtained from the base of the skull through the vertex without intravenous contrast. COMPARISON:  Jan 29, 2020. FINDINGS: Brain: No evidence of acute infarction, hemorrhage, hydrocephalus, extra-axial collection or mass lesion/mass effect. Vascular: No hyperdense vessel or unexpected calcification. Skull: Normal. Negative for fracture or focal lesion. Sinuses/Orbits: No acute finding. Other: None. IMPRESSION: Normal  head CT. Electronically Signed   By: Lupita RaiderJames  Green Jr M.D.   On: 12/17/2020 16:53   CT CHEST ABDOMEN PELVIS W CONTRAST  Result Date: 12/02/2020 CLINICAL DATA:  Chest pain, acute abdominal pain EXAM: CT CHEST, ABDOMEN, AND PELVIS WITH CONTRAST TECHNIQUE: Multidetector CT imaging of the chest, abdomen and pelvis was performed following the standard protocol during bolus administration of intravenous contrast. CONTRAST:  100mL OMNIPAQUE IOHEXOL 350 MG/ML SOLN COMPARISON:  CT chest 08/06/2003 FINDINGS: Due to technical malfunction of the scanner, the study, originally ordered as a CT arteriogram, resulted in relatively poor bolus timing and portal venous phase opacification. Additionally, the inferior pelvis including the bladder and terminal rectum are excluded from view. The examination, however, is still diagnostic for the visualized segment. CT CHEST FINDINGS Cardiovascular: Cardiac size within normal limits. No significant coronary artery  calcification. No pericardial effusion. Central pulmonary arteries are of normal caliber. The thoracic aorta is normal. Mediastinum/Nodes: Thyroid unremarkable. No pathologic thoracic adenopathy. Esophagus unremarkable. Moderate hiatal hernia noted. Lungs/Pleura: Minimal subpleural nodular infiltrate within the right lower lobe, axial image # 37, is nonspecific, but may be infectious or inflammatory in nature. Adjacent pulmonary nodule as well as ground-glass nodule on image # 32 are stable prior examination and are safely considered benign. The lungs are otherwise clear. No pneumothorax or pleural effusion. Musculoskeletal: Osseous structures are age-appropriate. No acute bone abnormality. CT ABDOMEN PELVIS FINDINGS Hepatobiliary: Moderate hepatic steatosis. No enhancing liver lesion. No intra or extrahepatic biliary ductal dilation. Gallbladder unremarkable. Pancreas: Unremarkable Spleen: Unremarkable Adrenals/Urinary Tract: The adrenal glands are normal. The kidneys are normal in size and position. Contrast opacifies the renal collecting systems. No hydronephrosis. No enhancing intrarenal mass. No definite intrarenal calcifications identified. Stomach/Bowel: Appendectomy has been performed. The visualized large and small bowel are unremarkable. No free intraperitoneal gas or significant free fluid. Vascular/Lymphatic: The abdominal vasculature is unremarkable. No pathologic adenopathy within the abdomen and visualized pelvis. Reproductive: Uterus and bilateral adnexa are unremarkable. Other: Tiny broad-based fat containing umbilical hernia. 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 Associated degenerative changes along the concavity. No acute bone abnormality. IMPRESSION: No acute intrathoracic or intra-abdominal pathology identified. Note that the  bladder and terminal rectum are not included on this examination. Multiple right lower lobe pulmonary nodules, safely considered benign given their stability over time. Minimal subpleural nodular infiltrate is nonspecific and may be infectious or inflammatory. Moderate hepatic steatosis. Electronically Signed   By: Helyn NumbersAshesh  Parikh MD   On: 12/02/2020 02:21   DG CHEST PORT 1 VIEW  Result Date: 12/19/2020 CLINICAL DATA:  Endotracheal tube EXAM: PORTABLE CHEST 1 VIEW COMPARISON:  12/18/2020 FINDINGS: Endotracheal tube terminates 1.5 cm above the carina. Enteric tube courses into the stomach. Mild patchy opacities in the right lung and at the left lung base, compatible with pneumonia, grossly unchanged. No pleural effusion or pneumothorax. The heart is normal in size. IMPRESSION: Multifocal pneumonia, grossly unchanged. Endotracheal tube terminates 1.5 cm above the carina. Additional support apparatus as above. Electronically Signed   By: Charline BillsSriyesh  Krishnan M.D.   On: 12/19/2020 06:25   DG Chest Port 1 View  Result Date: 12/18/2020 CLINICAL DATA:  Pneumonia EXAM: PORTABLE CHEST 1 VIEW COMPARISON:  Yesterday FINDINGS: Endotracheal tube with tip halfway between the clavicular heads and carina. The enteric tube reaches the stomach. Generous heart size attributed to technique. Extensive artifact from  EKG leads. Lower lung volumes. Right more than left airspace disease with worsening aeration. No visible effusion or pneumothorax. IMPRESSION: 1. Stable hardware positioning. 2. Bilateral pneumonia.  Aeration has worsened since yesterday. Electronically Signed   By: Marnee Spring M.D.   On: 12/18/2020 05:05   DG Chest Portable 1 View  Result Date: 12/17/2020 CLINICAL DATA:  Intubation and OG tube placement EXAM: PORTABLE CHEST 1 VIEW COMPARISON:  Earlier same day FINDINGS: Endotracheal tube is approximately 2.5 cm above the carina. Enteric tube passes into the stomach. Patchy density in the right lung, primarily in  the upper lobe. Probable retrocardiac density. No pleural effusion. No pneumothorax. Stable cardiomediastinal contours. IMPRESSION: 1. Endotracheal tube and enteric tube as above. 2. Patchy bilateral pulmonary opacities, similar to prior. Electronically Signed   By: Guadlupe Spanish M.D.   On: 12/17/2020 20:37   DG Chest Port 1 View  Result Date: 12/17/2020 CLINICAL DATA:  Post seizure.  Possible aspiration. EXAM: PORTABLE CHEST 1 VIEW COMPARISON:  Chest radiograph 12/01/2020, CT 12/02/2020 FINDINGS: Airspace consolidation throughout the right upper lobe, lesser patchy opacity at the right lung base. There is also likely retrocardiac opacity. Heart is normal in size with unchanged mediastinal contours. The patient is rotated. No pneumothorax or significant pleural effusion. No acute osseous abnormalities are seen. IMPRESSION: Multifocal airspace consolidation, right greater than left, most consistent with multifocal pneumonia. Given provided history, aspiration is suspected. Electronically Signed   By: Narda Rutherford M.D.   On: 12/17/2020 17:09   DG Abd Portable 1 View  Result Date: 12/17/2020 CLINICAL DATA:  Check gastric catheter placement EXAM: PORTABLE ABDOMEN - 1 VIEW COMPARISON:  None. FINDINGS: Gastric catheter is noted within the stomach. Scattered large and small bowel gas is noted. No free air is seen. IMPRESSION: Gastric catheter within the stomach. Electronically Signed   By: Alcide Clever M.D.   On: 12/17/2020 20:36   ECHOCARDIOGRAM LIMITED  Result Date: 12/18/2020    ECHOCARDIOGRAM REPORT   Patient Name:   PEARLIE NIES Date of Exam: 12/18/2020 Medical Rec #:  [id number]                  Height:       63.0 in Accession #:    [id number]                 Weight:       153.0 lb Date of Birth:  1971-02-14                   BSA:          1.726 m Patient Age:    49 years                   BP:           87/53 mmHg Patient Gender: F                          HR:           57 bpm. Exam  Location:  Inpatient Procedure: Limited Echo, Color Doppler and Cardiac Doppler Indications:    acute respiratory distress  History:        Patient has prior history of Echocardiogram examinations, most                 recent 12/02/2020. Risk Factors:polysubstance abuse. Alcohol  abuse.  Sonographer:    Delcie Roch Referring Phys: 76 MURALI RAMASWAMY  Sonographer Comments: Echo performed with patient supine and on artificial respirator. IMPRESSIONS  1. Left ventricular ejection fraction, by estimation, is 55 to 60%. The left ventricle has normal function. The left ventricle has no regional wall motion abnormalities. Left ventricular diastolic parameters were normal.  2. Right ventricular systolic function is normal. The right ventricular size is normal. There is normal pulmonary artery systolic pressure.  3. The mitral valve is normal in structure. Mild mitral valve regurgitation. No evidence of mitral stenosis.  4. Tricuspid valve regurgitation is severe.  5. The aortic valve is normal in structure. Aortic valve regurgitation is not visualized. No aortic stenosis is present.  6. The inferior vena cava is normal in size with greater than 50% respiratory variability, suggesting right atrial pressure of 3 mmHg. FINDINGS  Left Ventricle: Left ventricular ejection fraction, by estimation, is 55 to 60%. The left ventricle has normal function. The left ventricle has no regional wall motion abnormalities. The left ventricular internal cavity size was normal in size. There is  no left ventricular hypertrophy. Left ventricular diastolic parameters were normal. Right Ventricle: The right ventricular size is normal.Right ventricular systolic function is normal. There is normal pulmonary artery systolic pressure. The tricuspid regurgitant velocity is 2.61 m/s, and with an assumed right atrial pressure of 3 mmHg, the estimated right ventricular systolic pressure is 30.2 mmHg. Left Atrium: Left atrial size  was normal in size. Right Atrium: Right atrial size was normal in size. Pericardium: There is no evidence of pericardial effusion. Mitral Valve: The mitral valve is normal in structure. Mild mitral valve regurgitation. No evidence of mitral valve stenosis. Tricuspid Valve: The tricuspid valve is normal in structure. Tricuspid valve regurgitation is severe. No evidence of tricuspid stenosis. Aortic Valve: The aortic valve is normal in structure. Aortic valve regurgitation is not visualized. No aortic stenosis is present. Pulmonic Valve: The pulmonic valve was normal in structure. Pulmonic valve regurgitation is trivial. No evidence of pulmonic stenosis. Aorta: The aortic root is normal in size and structure. Venous: The inferior vena cava is normal in size with greater than 50% respiratory variability, suggesting right atrial pressure of 3 mmHg.   LEFT VENTRICLE PLAX 2D LVIDd:         4.40 cm Diastology LVIDs:         3.00 cm LV e' medial:    8.70 cm/s LV PW:         1.00 cm LV E/e' medial:  10.0 LV IVS:        0.80 cm LV e' lateral:   9.90 cm/s                        LV E/e' lateral: 8.8  IVC IVC diam: 1.90 cm LEFT ATRIUM         Index LA diam:    3.50 cm 2.03 cm/m  AORTIC VALVE LVOT Vmax:   90.70 cm/s LVOT Vmean:  60.900 cm/s LVOT VTI:    0.205 m MITRAL VALVE               TRICUSPID VALVE MV Area (PHT): 4.21 cm    TR Peak grad:   27.2 mmHg MV Decel Time: 180 msec    TR Vmax:        261.00 cm/s MV E velocity: 87.00 cm/s MV A velocity: 71.10 cm/s  SHUNTS MV E/A ratio:  1.22  Systemic VTI: 0.20 m Olga Millers MD Electronically signed by Olga Millers MD Signature Date/Time: 12/18/2020/3:52:43 PM    Final     Microbiology: Recent Results (from the past 240 hour(s))  Resp Panel by RT-PCR (Flu A&B, Covid) Nasopharyngeal Swab     Status: None   Collection Time: 12/17/20  4:47 PM   Specimen: Nasopharyngeal Swab; Nasopharyngeal(NP) swabs in vial transport medium  Result Value Ref Range Status   SARS  Coronavirus 2 by RT PCR NEGATIVE NEGATIVE Final    Comment: (NOTE) SARS-CoV-2 target nucleic acids are NOT DETECTED.  The SARS-CoV-2 RNA is generally detectable in upper respiratory specimens during the acute phase of infection. The lowest concentration of SARS-CoV-2 viral copies this assay can detect is 138 copies/mL. A negative result does not preclude SARS-Cov-2 infection and should not be used as the sole basis for treatment or other patient management decisions. A negative result may occur with  improper specimen collection/handling, submission of specimen other than nasopharyngeal swab, presence of viral mutation(s) within the areas targeted by this assay, and inadequate number of viral copies(<138 copies/mL). A negative result must be combined with clinical observations, patient history, and epidemiological information. The expected result is Negative.  Fact Sheet for Patients:  BloggerCourse.com  Fact Sheet for Healthcare Providers:  SeriousBroker.it  This test is no t yet approved or cleared by the [place] FDA and  has been authorized for detection and/or diagnosis of SARS-CoV-2 by FDA under an Emergency Use Authorization (EUA). This EUA will remain  in effect (meaning this test can be used) for the duration of the COVID-19 declaration under Section 564(b)(1) of the Act, 21 U.S.C.section 360bbb-3(b)(1), unless the authorization is terminated  or revoked sooner.       Influenza A by PCR NEGATIVE NEGATIVE Final   Influenza B by PCR NEGATIVE NEGATIVE Final    Comment: (NOTE) The Xpert Xpress SARS-CoV-2/FLU/RSV plus assay is intended as an aid in the diagnosis of influenza from Nasopharyngeal swab specimens and should not be used as a sole basis for treatment. Nasal washings and aspirates are unacceptable for Xpert Xpress SARS-CoV-2/FLU/RSV testing.  Fact Sheet for  Patients: BloggerCourse.com  Fact Sheet for Healthcare Providers: SeriousBroker.it  This test is not yet approved or cleared by the [place] FDA and has been authorized for detection and/or diagnosis of SARS-CoV-2 by FDA under an Emergency Use Authorization (EUA). This EUA will remain in effect (meaning this test can be used) for the duration of the COVID-19 declaration under Section 564(b)(1) of the Act, 21 U.S.C. section 360bbb-3(b)(1), unless the authorization is terminated or revoked.  Performed at [facility], [address] [place], [place] [address]   Culture, blood (routine x 2)     Status: None   Collection Time: 12/17/20  5:29 PM   Specimen: BLOOD LEFT HAND  Result Value Ref Range Status   Specimen Description   Final    BLOOD LEFT HAND Performed at [facility], [address] [place], [place] [address]    Special Requests   Final    BOTTLES DRAWN AEROBIC AND ANAEROBIC Blood Culture results may not be optimal due to an inadequate volume of blood received in culture bottles Performed at [facility], [address], [place], [place] [address]    Culture   Final    NO GROWTH 5 DAYS Performed at [facility], [address], [place], [place] [address]    Report Status 12/22/2020 FINAL  Final  Culture, blood (routine x 2)     Status: None   Collection Time: 12/17/20  6:45 PM   Specimen: BLOOD RIGHT FOREARM  Result Value Ref Range Status   Specimen Description   Final    BLOOD RIGHT FOREARM Performed at [facility], [address] [place], [place] [address]    Special Requests   Final    BOTTLES DRAWN AEROBIC AND ANAEROBIC Blood Culture adequate volume Performed at [facility], [address], [place], [place] [address]    Culture   Final    NO GROWTH 5 DAYS Performed at [facility], [address] [place], [place] [address]    Report Status 12/22/2020 FINAL  Final  MRSA PCR Screening     Status: None   Collection Time: 12/17/20  9:01 PM   Specimen: Nasal Mucosa; Nasopharyngeal  Result Value Ref Range Status   MRSA by PCR NEGATIVE NEGATIVE Final    Comment:        The GeneXpert MRSA Assay (FDA approved for NASAL specimens only), is one component of a comprehensive MRSA colonization surveillance program. It is not intended to diagnose MRSA infection nor to guide or monitor treatment for MRSA infections. Performed at [facility], [address], [place], [place] [address]      Labs: Basic Metabolic Panel: Recent Labs  Lab 12/17/20 2030 12/18/20 0222 12/19/20 0005 12/20/20 0309 12/21/20 4709 12/22/20 0240 12/23/20 0207 12/24/20 0240  NA  --  145 145 138 136 135 136  --   K  --  3.2* 3.3* 3.8 3.9 4.3 4.1  --   CL  --  116* 115* 105 101 102 103  --   CO2  --  17* 24 24 26 24  20*  --   GLUCOSE  --  103* 112* 66* 85 106* 98  --   BUN  --  7 <5* <5* 11 7 6   --   CREATININE 0.81 0.79 0.79 0.61 0.67 0.65 0.52 0.58  CALCIUM  --  8.2* 8.6* 8.9 8.3* 8.8* 9.1  --   MG 2.1 2.6*  --  1.5*  --  2.1  --   --   PHOS 2.6 3.1  --   --   --   --   --   --    Liver Function Tests: Recent Labs  Lab 12/17/20 1708 12/19/20 0005  AST 44* 22  ALT 39 22  ALKPHOS 98 72  BILITOT 0.9 0.6  PROT 7.0 6.4*  ALBUMIN 3.4* 2.9*   CBC: Recent Labs  Lab 12/19/20 0005 12/20/20 0309 12/21/20 0841 12/22/20 0240 12/23/20 0207  WBC 10.5 10.2 9.0 11.3* 9.3  HGB 11.5* 10.7* 10.7* 11.4* 11.6*  HCT 36.0 33.3* 32.0* 35.3* 35.4*  MCV 98.6 96.5 93.6 94.9 94.4  PLT 202 193 250 341 395    CBG: Recent Labs  Lab 12/20/20 1651 12/21/20 0741 12/22/20 0756 12/23/20 0730 12/24/20 0738  GLUCAP 103* 79 101* 100* 101*    Principal Problem:   Delirium tremens (HCC) Active Problems:   Cocaine abuse (HCC)   Seizure (HCC)   Acute respiratory failure with hypoxemia  (HCC)   Benzodiazepine abuse (HCC)   Encephalopathy acute   AKI (acute kidney injury) (HCC)   Tricuspid valve regurgitation   Acute respiratory acidosis   Aspiration pneumonia (HCC)   Alcohol withdrawal seizure with complication, with unspecified complication (HCC)   Acute metabolic encephalopathy   Toxic encephalopathy  Time coordinating discharge: 35 minutes  Signed:  Brendia Sacks, MD  [facility]  12/24/2020, 3:55 PM

## 2020-12-25 ENCOUNTER — Telehealth: Payer: Self-pay | Admitting: Family Medicine

## 2020-12-25 NOTE — Telephone Encounter (Signed)
Received a call from [employer], [facility] to [facility] at [address] which did not have the prescription for Librium and would not have out until Monday.  She requested that prescription be sent to [facility] on [address].  Patient reported she is doing fairly well and plans to go to inpatient rehab for alcohol on Monday.  I called the patient and spoke with her and with her stepfather and confirmed prescription had not been filled.  [facility] then [facility] on [address] and independently confirmed that the prescription had not been filled at [facility].  The pharmacist canceled that prescription on my behalf and took a verbal order for Librium 25 mg p.o. twice daily for 2 days then 25 mg p.o. daily for 1 day then stop.  This is a slight change from the discharge prescription as documented and the change is intentional.  Brendia Sacks, MD [facility]

## 2020-12-27 ENCOUNTER — Telehealth: Payer: Self-pay | Admitting: Family Medicine

## 2020-12-27 ENCOUNTER — Telehealth (HOSPITAL_COMMUNITY): Payer: Self-pay | Admitting: Licensed Clinical Social Worker

## 2020-12-27 ENCOUNTER — Telehealth (HOSPITAL_COMMUNITY): Payer: Self-pay | Admitting: *Deleted

## 2020-12-27 MED ORDER — CHLORDIAZEPOXIDE HCL 25 MG PO CAPS: ORAL_CAPSULE | ORAL | 0 refills | Status: DC

## 2020-12-27 NOTE — Telephone Encounter (Signed)
First of all please call the pharmacy and immediately cancel any remaining prescriptions for clonazepam that I had sent in the past few weeks.  Second, call the pt and clarify how many tablets of Librium is Margaret Mccann still taking ?? As per the prescription by primary team attending Margaret Mccann is supposed to take Librium 25 mg daily for 2 days starting yesterday. That implies Margaret Mccann should be completing the taper today.

## 2020-12-27 NOTE — Telephone Encounter (Addendum)
Thanks for canceling the clonazepam RX.  Thanks for clarifying that she is still taking Librium 25 mg BID which is not what the hospital treatment team recommended to her. She was supposed to be on one capsule daily starting April 3.  I am sending a Rx for Librium 25 mg once daily for 5 days with instructions to stop after 5 days as part of the Librium taper.  Hopefully, the treatment facility in [place] that she is leaving for on Wednesday and will provide her with excellent care and help her lead a sober life.

## 2020-12-27 NOTE — Telephone Encounter (Signed)
Lengthy voice mail left on writers phone from patient stating she had a serious suicide attempt last week and is just out of ICU in [place], and now has an opportunity to go to a rehab facility in [place] and will flying out on wed or thurs this week to go there. She is needing a Librium rx [facility] [address] so she can have it now and take it with her otherwise she has to pay a 300 fee for meds. Klonopin is what she formerly had been taking but the Dr at the rehab facility is recommending Librium as part of her detox routine. Will bring this concern to Dr Evelene Croon.

## 2020-12-27 NOTE — Telephone Encounter (Signed)
I have communicated recent hospitalization and recommendation to discontinue Klonopin to Dr. Evelene Croon. However I will defer to her on all aspects of longitudinal care.  Brendia Sacks, MD [facility]

## 2020-12-27 NOTE — Telephone Encounter (Signed)
Called Margaret Mccann back re her request for Librium and Dr Margaret Mccann response. Short rx of Librium called in to her preferred pharmacy. She states she is taking 1 Librium 25 mg BID and was unaware of any stopping date for the med. She is worried without it she would have a seizure. She expressed her appreciation that it was called in. Wanted Clinical research associate to let her therapist know she would be in treatment for 45 days and need to cx several of her appts. Also, Dr Margaret Mccann asked that I cancel any Klonopin rx at her pharmacy and I did, she had one refill remaining, she had last filled Klonopin on 12/02/20.

## 2020-12-27 NOTE — Addendum Note (Signed)
Addended by: Zena Amos on: 12/27/2020 12:27 PM   Modules accepted: Orders

## 2020-12-27 NOTE — Telephone Encounter (Signed)
LCSW called pt to f/u on her call Friday after work hours. Call went to vm. LCSW informed pt of receipt of message from her today reporting she is going into 45 day residential tx program in [place]. LCSW provided support and encouragement. Pt will reschedule appts with this clinician upon return.

## 2020-12-28 ENCOUNTER — Telehealth (HOSPITAL_COMMUNITY): Payer: Self-pay | Admitting: *Deleted

## 2020-12-28 ENCOUNTER — Other Ambulatory Visit (HOSPITAL_COMMUNITY): Payer: Self-pay | Admitting: Psychiatry

## 2020-12-28 DIAGNOSIS — F4310 Post-traumatic stress disorder, unspecified: Secondary | ICD-10-CM

## 2020-12-28 DIAGNOSIS — F339 Major depressive disorder, recurrent, unspecified: Secondary | ICD-10-CM

## 2020-12-28 NOTE — Telephone Encounter (Signed)
[facility] [address] to ask for clarification re patients Klonopin. Yesterday I called to cx her rx and pharmacist called today because patient had put in for it to be filled and the pharmacy did have a rx on file, so what got cx yesterday was the refill. Cancelled the rx on file too as patient is going to rehab this week and is already on Librium per the hospital she was recently discharged from.

## 2020-12-29 ENCOUNTER — Other Ambulatory Visit (HOSPITAL_COMMUNITY): Payer: Self-pay | Admitting: Psychiatry

## 2020-12-29 DIAGNOSIS — F339 Major depressive disorder, recurrent, unspecified: Secondary | ICD-10-CM

## 2020-12-31 ENCOUNTER — Other Ambulatory Visit (HOSPITAL_COMMUNITY): Payer: Self-pay | Admitting: Psychiatry

## 2020-12-31 ENCOUNTER — Telehealth (HOSPITAL_COMMUNITY): Payer: Self-pay

## 2020-12-31 DIAGNOSIS — F4310 Post-traumatic stress disorder, unspecified: Secondary | ICD-10-CM

## 2020-12-31 DIAGNOSIS — F339 Major depressive disorder, recurrent, unspecified: Secondary | ICD-10-CM

## 2020-12-31 NOTE — Telephone Encounter (Signed)
I am not licensed to practice medicine in the state of [place]. I can not send any prescriptions on her behalf since she is out of this state. She needs to communicate with the staff at her rehab in [place] and they need to coordinate her psychiatric care locally.

## 2020-12-31 NOTE — Telephone Encounter (Signed)
Patient's step-father called stating that patient is in a rehab in [place] and that she needs a refill on her Fluoxetine 20mg . He also mentioned Vistaril but I didn't see anything about that. Please review and advise. Thank you

## 2021-01-03 NOTE — Telephone Encounter (Signed)
Relayed message

## 2021-01-04 ENCOUNTER — Ambulatory Visit (HOSPITAL_COMMUNITY): Payer: Self-pay | Admitting: Licensed Clinical Social Worker

## 2021-01-20 ENCOUNTER — Telehealth (HOSPITAL_COMMUNITY): Payer: No Payment, Other | Admitting: Psychiatry

## 2021-01-20 ENCOUNTER — Other Ambulatory Visit: Payer: Self-pay

## 2021-02-17 ENCOUNTER — Ambulatory Visit (HOSPITAL_COMMUNITY): Payer: No Payment, Other | Admitting: Licensed Clinical Social Worker

## 2021-03-11 ENCOUNTER — Other Ambulatory Visit: Payer: Self-pay

## 2021-03-11 ENCOUNTER — Telehealth (HOSPITAL_COMMUNITY): Payer: Self-pay | Admitting: Licensed Clinical Social Worker

## 2021-03-11 ENCOUNTER — Ambulatory Visit (HOSPITAL_COMMUNITY): Payer: No Payment, Other | Admitting: Licensed Clinical Social Worker

## 2021-03-11 DIAGNOSIS — F489 Nonpsychotic mental disorder, unspecified: Secondary | ICD-10-CM

## 2021-03-11 NOTE — BH Specialist Note (Signed)
LCSW sent text link for video session per schedule. LCSW stayed onlline avail for session until 8:12. Pt failed to sign on.

## 2021-03-16 ENCOUNTER — Ambulatory Visit (INDEPENDENT_AMBULATORY_CARE_PROVIDER_SITE_OTHER): Payer: No Payment, Other | Admitting: Psychiatry

## 2021-03-16 ENCOUNTER — Other Ambulatory Visit: Payer: Self-pay

## 2021-03-16 ENCOUNTER — Encounter (HOSPITAL_COMMUNITY): Payer: Self-pay | Admitting: Psychiatry

## 2021-03-16 VITALS — BP 123/90 | HR 88 | Ht 63.0 in | Wt 153.0 lb

## 2021-03-16 DIAGNOSIS — F339 Major depressive disorder, recurrent, unspecified: Secondary | ICD-10-CM | POA: Diagnosis not present

## 2021-03-16 DIAGNOSIS — I10 Essential (primary) hypertension: Secondary | ICD-10-CM | POA: Insufficient documentation

## 2021-03-16 DIAGNOSIS — E782 Mixed hyperlipidemia: Secondary | ICD-10-CM

## 2021-03-16 DIAGNOSIS — F1021 Alcohol dependence, in remission: Secondary | ICD-10-CM

## 2021-03-16 DIAGNOSIS — F4321 Adjustment disorder with depressed mood: Secondary | ICD-10-CM | POA: Diagnosis not present

## 2021-03-16 DIAGNOSIS — F4310 Post-traumatic stress disorder, unspecified: Secondary | ICD-10-CM | POA: Diagnosis not present

## 2021-03-16 DIAGNOSIS — F3341 Major depressive disorder, recurrent, in partial remission: Secondary | ICD-10-CM

## 2021-03-16 DIAGNOSIS — F418 Other specified anxiety disorders: Secondary | ICD-10-CM

## 2021-03-16 MED ORDER — AMLODIPINE BESYLATE 5 MG PO TABS: 5.0000 mg | ORAL_TABLET | Freq: Every day | ORAL | 1 refills | Status: DC

## 2021-03-16 MED ORDER — PROPRANOLOL HCL 10 MG PO TABS
10.0000 mg | ORAL_TABLET | ORAL | 1 refills | Status: DC
Start: 2021-03-16 — End: 2021-05-16

## 2021-03-16 MED ORDER — DESVENLAFAXINE SUCCINATE ER 50 MG PO TB24
ORAL_TABLET | ORAL | 1 refills | Status: DC
Start: 2021-03-16 — End: 2021-05-09

## 2021-03-16 MED ORDER — MIRTAZAPINE 30 MG PO TABS: 30.0000 mg | ORAL_TABLET | Freq: Every day | ORAL | 1 refills | Status: DC

## 2021-03-16 MED ORDER — HYDROXYZINE HCL 25 MG PO TABS: ORAL_TABLET | ORAL | 1 refills | Status: DC

## 2021-03-16 MED ORDER — GABAPENTIN 600 MG PO TABS: 1200.0000 mg | ORAL_TABLET | Freq: Two times a day (BID) | ORAL | 1 refills | Status: DC

## 2021-03-16 MED ORDER — ATORVASTATIN CALCIUM 20 MG PO TABS: 20.0000 mg | ORAL_TABLET | Freq: Every evening | ORAL | 1 refills | Status: DC

## 2021-03-16 MED ORDER — OLANZAPINE 2.5 MG PO TABS
2.5000 mg | ORAL_TABLET | Freq: Every evening | ORAL | 1 refills | Status: DC | PRN
Start: 2021-03-16 — End: 2021-05-09

## 2021-03-16 NOTE — Progress Notes (Signed)
BH OP Progress Note       Patient Identification: Margaret Mccann Jana Swartzlander MRN:  [id number] Date of Evaluation:  03/16/2021    Chief Complaint:  " I have not felt this good in a long time."  Visit Diagnosis:    ICD-10-CM   1. Major depressive disorder, recurrent episode, in partial remission with anxious distress (HCC)  F33.41 gabapentin (NEURONTIN) 600 MG tablet   F41.8 mirtazapine (REMERON) 30 MG tablet    desvenlafaxine (PRISTIQ) 50 MG 24 hr tablet    OLANZapine (ZYPREXA) 2.5 MG tablet    2. PTSD (post-traumatic stress disorder)  F43.10 hydrOXYzine (ATARAX/VISTARIL) 25 MG tablet    mirtazapine (REMERON) 30 MG tablet    desvenlafaxine (PRISTIQ) 50 MG 24 hr tablet    propranolol (INDERAL) 10 MG tablet    3. Grief  F43.21     4. Major depressive disorder, recurrent episode with anxious distress (HCC)  F33.9     5. Essential hypertension  I10 amLODipine (NORVASC) 5 MG tablet    6. Mixed hyperlipidemia  E78.2 atorvastatin (LIPITOR) 20 MG tablet    7. Alcohol use disorder, severe, in early remission (HCC)  F10.21       History of Present Illness: Patient reported she is feeling much better now.  She recently was discharged from her rehab facility in [place] after spending about 6 weeks there.  She returned home on May 29.  Patient was last seen by the writer on March 10.  A few weeks after that she was hospitalized on March 25 for alcohol withdrawal seizures and delirium tremens.  She has been mixing her medications with alcohol and had been drinking heavily.  During her hospital stay she needed to be intubated for airway protection.  After medical stabilization she was discharged on April 1.  She was weaned off clonazepam and given short-term taper of Librium.  A few days after her discharge she went to [place] for rehab however before she left for rehab she called the writer's office to request refill for Librium.  She was explained that the plan is to taper it off and not to be  taking it regularly and also because she is going to a rehab facility she should not be on a benzodiazepine.  Today patient presented with a long list of medications that she was discharged on from the facility.  As per the list ,she was discharged on gabapentin 1200 mg Bid, Prozac 60 mg daily, Pristiq 25 mg daily, propanolol 10 mg daily, clonidine 0.1 mg and 0.2 mg strength, mirtazapine 30 mg at bedtime, trazodone 50 mg at bedtime as needed, olanzapine 2.5 milligrams at bedtime as needed, Vistaril 25 mg twice daily as needed, 50 mg at bedtime along with atorvastatin 20 mg, esomeprazole 20 mg and a few multivitamins.  Patient stated that this combination is working really well for her and she would like to keep things the way they are.  Writer was very concerned about this.  Writer expressed to her clearly that this is a very serious case of polypharmacy.  Writer pointed out that she is on 3 different antidepressants all of which have serotonergic properties and she is at a very high risk for serotonin syndrome.  Writer recommended that we either stick with Prozac or Pristiq.  Writer also pointed out that currently she is taking 4 different medications to sleep at night which is very concerning.  Writer pointed out that she has overdosed 2 times in the last 12 months  making her very high risk patient to do something like this again. Patient was somewhat upset and stated that she is feeling panicky because the writer is talking about taking away all her medications.  Writer went through the long list of medications one by one and explained to her the rationale and then after some back-and-forth she agreed that she will not take both Prozac and Pristiq and asked if her dose of Pristiq could be increased so that she can just stay on Pristiq as she has found it to be helpful.  She stated that she has taken Prozac for a long time so maybe her body has gotten used to it. Writer recommended that we adjust the dose  of Pristiq to 75 mg daily and see how she does with that and taper off the Prozac.  She was explained that since Prozac has a long half-life it will be self tapered and she will not have to worry about any withdrawal effects. Similarly writer was not sure why she needs trazodone along with olanzapine along with mirtazapine along with Vistaril for sleep at bedtime.  She stated that she does not really think she needs the trazodone so we could take that away.  She also stated that she does not use olanzapine every night and only uses it as needed.  Writer advised her to avoid taking it, she replied she would like to just have maybe 15 tablets so that she can take it if she can sleep as needed. Regarding the high dose of clonidine, she insisted that she needs it because of her blood pressure issues.  Her blood pressure today was 123/90.  Writer explained to her that if she has hypertension then she should be under the care of her PCP and that clonidine is not the best option for that.  Writer was also concerned about the combination of propanolol with clonidine for blood pressure. Patient stated that she was started on propanolol at the rehab facility and it has immensely helped her with the flashbacks and startle response associated with PTSD. She insisted that she would like to continue taking the propanolol.  Writer recommended that we start amlodipine to target her blood pressure and discontinue the clonidine.  She was agreeable to that.  Writer is still very concerned about polypharmacy.  Patient stated that she wanted to assure the writer that she would not overdose again because she is feeling much better now.  She stated that she has even started a job yesterday.  She will be working in a [employer].  She stated that she is really excited about this opportunity. She is now back with her parents and her parents have all her medication bottles in their possession.  She does not have access to any of  those medications. She has not relapsed on alcohol or any other illicit substances since her discharge.  Past Psychiatric History: MDD , anxiety, PTSD, Substance abuse (cocaine, alcohol)  Previous Psychotropic Medications: Yes - has also taken Seroquel and Wellbutrin in the past.  She informed that she felt a seizure after using Wellbutrin and therefore does not want to take it again.   Past Medical History:  Past Medical History:  Diagnosis Date   Anxiety    Bipolar 1 disorder (HCC)    Depression    Morgellons syndrome    Tricuspid valve regurgitation 12/18/2020    Past Surgical History:  Procedure Laterality Date   APPENDECTOMY      Family Psychiatric History:  Sister - committed suicide in 2005 by overdosing.  Family History:  Family History  Problem Relation Age of Onset   Heart attack Father 9   Stroke Father    Breast cancer Paternal Aunt    Ulcerative colitis Neg Hx    Esophageal cancer Neg Hx     Social History:   Social History   Socioeconomic History   Marital status: Single    Spouse name: Not on file   Number of children: Not on file   Years of education: Not on file   Highest education level: Not on file  Occupational History   Not on file  Tobacco Use   Smoking status: Former    Packs/day: 0.50    Pack years: 0.00    Types: Cigarettes   Smokeless tobacco: Never  Vaping Use   Vaping Use: Never used  Substance and Sexual Activity   Alcohol use: Yes    Comment: occas   Drug use: Not Currently    Comment: not currently-was +cocaine and amphetamines, clean x3 years   Sexual activity: Yes    Birth control/protection: Pill  Other Topics Concern   Not on file  Social History Narrative   ** Merged History Encounter **       Social Determinants of Health   Financial Resource Strain: Not on file  Food Insecurity: Not on file  Transportation Needs: Not on file  Physical Activity: Not on file  Stress: Not on file  Social Connections: Not on  file    Additional Social History: Currently living with her parents, recently lost her boyfriend of 3 years to suicide in 2021.  Is divorced and has 2 children who live with their father.    Allergies:   Allergies  Allergen Reactions   Lamictal [Lamotrigine] Anaphylaxis, Rash and Other (See Comments)    Stevens-Johnson syndrome and fevers, also   Lamictal [Lamotrigine] Anaphylaxis, Rash and Other (See Comments)    Fevers and Stevens-Johnson syndrome, also   Tramadol Other (See Comments)    Pt had a seizure after taking Tramadol!!   Sulfamethoxazole-Trimethoprim Rash   Erythromycin Nausea And Vomiting   Erythromycin Base Nausea And Vomiting   Geodon [Ziprasidone Hcl] Rash   Levetiracetam Anxiety   Sulfa Antibiotics Rash    Metabolic Disorder Labs: Lab Results  Component Value Date   HGBA1C 5.6 12/19/2020   MPG 114.02 12/19/2020   No results found for: PROLACTIN Lab Results  Component Value Date   TRIG 110 12/18/2020   No results found for: TSH  Therapeutic Level Labs: No results found for: LITHIUM No results found for: CBMZ No results found for: VALPROATE  Current Medications: Current Outpatient Medications  Medication Sig Dispense Refill   amLODipine (NORVASC) 5 MG tablet Take 1 tablet (5 mg total) by mouth daily. 30 tablet 1   atorvastatin (LIPITOR) 20 MG tablet Take 1 tablet (20 mg total) by mouth at bedtime. 30 tablet 1   desvenlafaxine (PRISTIQ) 50 MG 24 hr tablet Take one and a half tablets daily (75 mg) 45 tablet 1   OLANZapine (ZYPREXA) 2.5 MG tablet Take 1 tablet (2.5 mg total) by mouth at bedtime as needed (insomnia/sleep). 15 tablet 1   propranolol (INDERAL) 10 MG tablet Take 1 tablet (10 mg total) by mouth every morning. 30 tablet 1   folic acid (FOLVITE) 1 MG tablet Take 1 tablet (1 mg total) by mouth daily.     gabapentin (NEURONTIN) 600 MG tablet Take 2 tablets (1,200 mg total)  by mouth 2 (two) times daily. 120 tablet 1   hydrOXYzine (ATARAX/VISTARIL)  25 MG tablet Take 2 tablets twice daily as needed for anxiety and take 2 tablets at bedtime as needed for sleep 120 tablet 1   mirtazapine (REMERON) 30 MG tablet Take 1 tablet (30 mg total) by mouth at bedtime. 30 tablet 1   Multiple Vitamin (MULTIVITAMIN WITH MINERALS) TABS tablet Take 1 tablet by mouth daily.     thiamine 100 MG tablet Take 1 tablet (100 mg total) by mouth daily.     No current facility-administered medications for this visit.      Psychiatric Specialty Exam: Review of Systems  Blood pressure 123/90, pulse 88, height 5\' 3"  (1.6 m), weight 153 lb (69.4 kg).Body mass index is 27.1 kg/m.  General Appearance: Fairly Groomed  Eye Contact:  Good  Speech:  Clear and Coherent and Normal Rate  Volume:  Normal  Mood: Less Anxious and Depressed  Affect:  Congruent  Thought Process:  Goal Directed and Descriptions of Associations: Intact  Orientation:  Full (Time, Place, and Person)  Thought Content:  Logical and med focused  Suicidal Thoughts:  No  Homicidal Thoughts:  No  Memory:  Immediate;   Good Recent;   Good  Judgement:  Fair  Insight:  Fair  Psychomotor Activity:  Normal  Concentration:  Concentration: Good and Attention Span: Good  Recall:  Good  Fund of Knowledge:Good  Language: Good  Akathisia:  Negative  Handed:  Right  AIMS (if indicated):  0  Assets:  Communication Skills Desire for Improvement Financial Resources/Insurance Housing Social Support  ADL's:  Intact  Cognition: WNL  Sleep:  Good   Screenings: AIMS    Flowsheet Row [facility]  AIMS Total Score 0      AUDIT    Flowsheet Row [facility]) [facility] Ad[facility] ([facility]  Alcohol Use Disorder Identification Test Final Score (AUDIT) 11 0      GAD-7    Flowsheet Row [facility]  Total GAD-7 Score 2      PHQ2-9    Flowsheet Row [facility]  PHQ-2 Total Score 0  PHQ-9 Total Score 3      Flowsheet Row Office [facility] (Discharged[facility]  C-SSRS RISK CATEGORY No Risk No Risk Error: Question 6 not populated       Assessment and Plan: Patient is reporting that she is feeling much better now after she spent a few weeks in the rehab facility [facility] the [facility] [place].  Patient was discharged on a very long list of psychotropic medications and writer had a very long discussion with her about this.  Patient was not too happy about the recommendations writer made however she reluctantly agreed to cut down the number of medicines she is on. Writer is still concerned about polypharmacy however patient insists that she would like to continue taking Gabapentin. Writer reduced the number of psychotropic meds to lower the associated risk if she was to overdose again given her hx of 2 overdoses in last 12 months. Pt is stating that her medication  bottles are in the possession of her parents.  1. Major depressive disorder, recurrent episode, in partial remission with anxious distress (HCC)  -Continue gabapentin (NEURONTIN) 600 MG tablet; Take 2 tablets (1,200 mg total) by mouth 2 (two) times daily.  Dispense: 120 tablet; Refill: 1 -Continue mirtazapine (REMERON) 30 MG tablet; Take 1 tablet (30 mg total) by mouth at bedtime.  Dispense: 30 tablet; Refill: 1 -Discontinue Prozac 60 mg, patient was also taking Pristiq 25 mg along with that in addition to mirtazapine.  Writer expressed the concern for serotonin syndrome due to the combination of all these  serotonergic medications and recommended that we either stick with Prozac or Pristiq.  Patient chose to continue Pristiq.  -Increase desvenlafaxine (PRISTIQ) 50 MG 24 hr tablet; Take one and a half tablets daily (75 mg)  Dispense: 45 tablet; Refill: 1 -OLANZapine (ZYPREXA) 2.5 MG tablet; Take 1 tablet (2.5 mg total) by mouth at bedtime as needed (insomnia/sleep).  Dispense: 15 tablet; Refill: 1.  Patient was advised to avoid using olanzapine since she already has so many sedating medications on board.  She asked for 15 tablets/month as she plans to use it only as needed if she can sleep.  2. PTSD (post-traumatic stress disorder)  -Continue hydrOXYzine (ATARAX/VISTARIL) 25 MG tablet; Take 2 tablets twice daily as needed for anxiety and take 2 tablets at bedtime as needed for sleep  Dispense: 120 tablet; Refill: 1 - mirtazapine (REMERON) 30 MG tablet; Take 1 tablet (30 mg total) by mouth at bedtime.  Dispense: 30 tablet; Refill: 1 - desvenlafaxine (PRISTIQ) 50 MG 24 hr tablet; Take one and a half tablets daily (75 mg)  Dispense: 45 tablet; Refill: 1 -Continue propranolol (INDERAL) 10 MG tablet; Take 1 tablet (10 mg total) by mouth every morning.  Dispense: 30 tablet; Refill: 1  3. Grief   4. Essential hypertension  -Start amLODipine (NORVASC) 5 MG tablet; Take 1 tablet (5 mg total) by mouth daily.  Dispense: 30 tablet; Refill: 1 -Patient has been taking clonidine 0.2 mg tablet at bedtime and then takes an additional 0.1 mg tablet sometimes for hypertension.  Writer recommended that she starts amlodipine and tapers off the clonidine.  Writer also recommended that she connects with a PCP, she was provided [facility].  5. Mixed hyperlipidemia  -Continue atorvastatin (LIPITOR) 20 MG tablet; Take 1 tablet (20 mg total) by mouth at bedtime.  Dispense: 30 tablet; Refill: 1  6. Alcohol use disorder, severe, in early remission  Patient missed her appointment  with her therapist Ms. Hatch last week.  Patient was informed that writer is leaving this office therefore her care is being transferred to a different provider Dr. Doyne Keel.   Zena Amos, MD 6/22/20223:47 PM

## 2021-05-03 ENCOUNTER — Telehealth (HOSPITAL_COMMUNITY): Payer: Self-pay | Admitting: *Deleted

## 2021-05-03 NOTE — Telephone Encounter (Signed)
Call from [place], patients mom, spoke with CME yesterday and calling today with same concern. States patient is living with her, in outpatient rehab and working somewhere in [employer]. Reports her BP is elevated. Writer told her she has an appt next week, first appt with the new provider as Dr Evelene Croon has left this practice and can discuss it then. This clinic doesn't generally treat hypertension, Dr Evelene Croon had taken her off Clonidine and put her on Norvasc for essential hypertension, per her note and not able to tell her Toy Cookey DNP would be willing to do the same thing. Also, Dr Evelene Croon referred her to [facility] for her BP and no follow thru was done on that front. Mom is wanting [place] to give her a few pills of the Clonidine because she is afraid she will have a stroke. Gave her the number for [facility], suggested if her concern is so great she would take her to an ED, said they have done that many times. Told her I would impart this conversation to the provider and see if she would like to take any further action prior to patients appt on 05/12/21.

## 2021-05-03 NOTE — Telephone Encounter (Signed)
Spoke with Toy Cookey NP  re earlier call from patients mom requesting a short supply of Clonidine to manage her BP which she reports as elevated. Margaret Mccann states she will have to follow up with a medical provider for it and she will see her on her appt Monday. VM left after writer spoke with Toy Cookey DNP asking for [place] to help her as her Clonidine was originally ordered for her anxiety and sleep, she was recently in a rehab center in [facility] and "they worked hard to get my medicines right' " I am doing the best I ever have" I am unaware of what meds she got from rehab. She did not mention BP in her message but continues to ask for the Clonidine. Will forward concern to Margaret Mccann once more for her consideration.

## 2021-05-04 ENCOUNTER — Telehealth (HOSPITAL_COMMUNITY): Payer: Self-pay | Admitting: *Deleted

## 2021-05-04 NOTE — Telephone Encounter (Signed)
Provider spoke to patient and was informed that she continues to monitor her blood pressure.  She notes that she continues to take propanolol and not amlodipine.  Provider informed patient that amlodipine and clonidine may not be taking best in combination.  She informed Clinical research associate that her blood pressure this morning was 160/110.  Provider informed patient that her specialty is in psychiatry and not primary care and recommended that she go to urgent care, or the ED for further assistance as her blood pressure will be better managed there.  She endorsed understanding and agreed.  She notes that she does have an appointment at [facility]  in a few weeks.  She notes that she would go to the urgent care or ED for further assistance.  No other concerns noted at this time.

## 2021-05-04 NOTE — Telephone Encounter (Signed)
VM left on writers phone from patients mom stating [facility] cant help her for weeks, which I told her yesterday when we first spoke. She also wants to make sure I am giving this information to [place]. She states she would like to speak with Brittney. I spoke with Brittney about patient this am and her position was to continue to refer her a PCP. Will make Brittney aware of this phone call.

## 2021-05-04 NOTE — Telephone Encounter (Signed)
Provider agrees with Dr. Magdalen Spatz recommendation.  She was informed by Dr. Evelene Croon that clonidine and propranolol should not be taking in combination.  She was also informed to go to [facility] for primary care evaluation.

## 2021-05-04 NOTE — Telephone Encounter (Signed)
Patient currently managed on propranolol and amlodipine.   She was informed by Dr. Quintella Baton the combination of these clonidine and propanolol were not recommended in combination.  Provider will not fill clonidine at this time and recommend that patient follow-up at [facility] for assistance with her blood pressure.  This recommendation was also given to her by Dr. Quintella Baton.

## 2021-05-04 NOTE — Telephone Encounter (Signed)
Provider called patient and informed that her blood pressure has been extremely high noting that it read 160/110 this morning.  Provider informed patient that she was not a medical doctor and would be best cared for at her primary care doctor, the ED, or urgent care.  She endorsed understanding and notes that she would follow-up with them soon.  She informed Clinical research associate that she does not have an appointment at [facility] and [facility] for a few weeks.  She did note that she would go to urgent care or the ED for further assistance.

## 2021-05-04 NOTE — Telephone Encounter (Signed)
Writer called her preferred pharmacy of [facility] on [address] to see when the last time she filled Clonidine was, told she last filled it in April 2022. NO other rxs have been filled there for her that were written by any one other than Dr Evelene Croon. She last filled rxs from Dr Evelene Croon in July.

## 2021-05-06 ENCOUNTER — Other Ambulatory Visit (HOSPITAL_COMMUNITY): Payer: Self-pay | Admitting: Psychiatry

## 2021-05-06 ENCOUNTER — Telehealth (HOSPITAL_COMMUNITY): Payer: Self-pay | Admitting: *Deleted

## 2021-05-06 DIAGNOSIS — F4310 Post-traumatic stress disorder, unspecified: Secondary | ICD-10-CM

## 2021-05-06 DIAGNOSIS — F3341 Major depressive disorder, recurrent, in partial remission: Secondary | ICD-10-CM

## 2021-05-06 NOTE — Telephone Encounter (Signed)
VM left re her need for her remeron before her appt this Monday on the 15th. Reviewed record and she should have medicine till around the 20th. Called her pharmacy to verify, she last filled on 10/13/20, she should then have medicine till the 17th.Also, her provider is gone for the day, she works till noon on fridays and I received [employer] at or around 130 pm.

## 2021-05-09 ENCOUNTER — Other Ambulatory Visit (HOSPITAL_COMMUNITY): Payer: Self-pay | Admitting: Psychiatry

## 2021-05-09 ENCOUNTER — Encounter (HOSPITAL_COMMUNITY): Payer: Self-pay | Admitting: Psychiatry

## 2021-05-09 ENCOUNTER — Other Ambulatory Visit: Payer: Self-pay

## 2021-05-09 ENCOUNTER — Telehealth (HOSPITAL_COMMUNITY): Payer: Self-pay | Admitting: *Deleted

## 2021-05-09 ENCOUNTER — Ambulatory Visit (INDEPENDENT_AMBULATORY_CARE_PROVIDER_SITE_OTHER): Payer: No Payment, Other | Admitting: Psychiatry

## 2021-05-09 VITALS — BP 142/103 | HR 76 | Ht 63.0 in | Wt 160.0 lb

## 2021-05-09 DIAGNOSIS — I10 Essential (primary) hypertension: Secondary | ICD-10-CM | POA: Diagnosis not present

## 2021-05-09 DIAGNOSIS — F418 Other specified anxiety disorders: Secondary | ICD-10-CM | POA: Diagnosis not present

## 2021-05-09 DIAGNOSIS — F3341 Major depressive disorder, recurrent, in partial remission: Secondary | ICD-10-CM

## 2021-05-09 DIAGNOSIS — F4310 Post-traumatic stress disorder, unspecified: Secondary | ICD-10-CM

## 2021-05-09 DIAGNOSIS — F339 Major depressive disorder, recurrent, unspecified: Secondary | ICD-10-CM

## 2021-05-09 MED ORDER — OLANZAPINE 2.5 MG PO TABS: 2.5000 mg | ORAL_TABLET | Freq: Every evening | ORAL | 2 refills | Status: DC | PRN

## 2021-05-09 MED ORDER — MIRTAZAPINE 30 MG PO TABS: 30.0000 mg | ORAL_TABLET | Freq: Every day | ORAL | 2 refills | Status: DC

## 2021-05-09 MED ORDER — DESVENLAFAXINE SUCCINATE ER 50 MG PO TB24: ORAL_TABLET | ORAL | 2 refills | Status: DC

## 2021-05-09 MED ORDER — HYDROXYZINE HCL 25 MG PO TABS: ORAL_TABLET | ORAL | 2 refills | Status: DC

## 2021-05-09 MED ORDER — GABAPENTIN 600 MG PO TABS: 1200.0000 mg | ORAL_TABLET | Freq: Two times a day (BID) | ORAL | 2 refills | Status: DC

## 2021-05-09 NOTE — Telephone Encounter (Signed)
PATIENT CALLED TO INFORM & REQUEST. 1) EARLIEST APPT AVAILABLE FOR DR Delford Field IS 8/22 @ 1:30 PM. 2) CAN YOU GIVE A 7 DAY SCRIPT UNTIL APPT?

## 2021-05-09 NOTE — Telephone Encounter (Signed)
Provider discussed with patient that clonidine cannot be filled by provider.  Provider did talk to Dr. Delford Field who was able to get her in on 05/16/2021.  Patient can go to the urgent to have this medication prescribed to her and to be better monitored.  Patient notes that she does not want to do this at this time.  No other concerns noted at this time.

## 2021-05-09 NOTE — Telephone Encounter (Signed)
Medication refilled and sent to preferred pharmacy

## 2021-05-09 NOTE — Progress Notes (Signed)
BH MD/PA/NP OP Progress Note  05/09/2021 2:22 PM Margaret Mccann  MRN:  [id number]  Chief Complaint: "I am concerned about my blood pressure" Chief Complaint   Medication Management     HPI: 50 year old female seen today for follow-up psychiatric evaluation.  She is a former patient of Dr. M.Kaur is being transferred to Clinical research associate for medication management.  She has a psychiatric history of depression, anxiety, PTSD, and substance use (cocaine, amphetamine use, stimulants, and alcohol).  She is managed on gabapentin 600 mg twice daily Remeron 30 mg nightly, Pristiq 75 mg daily, Zyprexa 2.5 mg nightly (takes as needed), and hydroxyzine 25 mg 3 times daily.  She notes her medications are somewhat effective in managing her psychiatric conditions.  Today she is well-groomed, pleasant, cooperative, engaged in conversation, maintained eye contact.  She informed provider that since her last visit she has felt mentally stable and reports that she is concerned about her blood pressure.  She notes that for the last 9 days her blood pressure has been out of control.  She notes that when she checked it this morning it was 142/105.  She notes that it has gone as high as 160 /110.  She informed Clinical research associate that she would like to be restarted on clonidine.  Currently patient is taking amlodipine and propanolol.  Provider informed patient that the medications in combination should not be used with clonidine.  She endorsed understanding however she felt it was most effective.  Provider referred patient to community health and wellness.  Provider contacted Dr. Shan Levans regarding seeing the patient sooner than later.  Dr. Ranae Plumber informed writer that she will be put on the schedule soon.   Patient informed writer that she has minimal anxiety and depression.  Provider conducted a GAD-7 and patient scored an 8.  Provider also conducted PHQ-9 and patient scored a 7.  She informed Clinical research associate that she continues to work at  [employer].  She informed Clinical research associate that she finds enjoyment in her job. She endorses adequate sleep and appetite.  Today she denies SI/HI/VAH, mania, paranoia.  No medication changes made today.  Patient referred to community health and wellness for physical examination as well as medication management.  No other concerns noted at this time.   Visit Diagnosis:    ICD-10-CM   1. Primary hypertension  I10 Ambulatory referral to Internal Medicine    2. Major depressive disorder, recurrent episode, in partial remission with anxious distress (HCC)  F33.41 gabapentin (NEURONTIN) 600 MG tablet   F41.8 mirtazapine (REMERON) 30 MG tablet    desvenlafaxine (PRISTIQ) 50 MG 24 hr tablet    OLANZapine (ZYPREXA) 2.5 MG tablet    3. PTSD (post-traumatic stress disorder)  F43.10 hydrOXYzine (ATARAX/VISTARIL) 25 MG tablet    mirtazapine (REMERON) 30 MG tablet    desvenlafaxine (PRISTIQ) 50 MG 24 hr tablet      Past Psychiatric History:  MDD , anxiety, PTSD, Substance abuse (cocaine, alcohol)  Past Medical History:  Past Medical History:  Diagnosis Date   Anxiety    Bipolar 1 disorder (HCC)    Depression    Morgellons syndrome    Tricuspid valve regurgitation 12/18/2020    Past Surgical History:  Procedure Laterality Date   APPENDECTOMY      Family Psychiatric History: Sister - committed suicide in 2005 by overdosing.  Family History:  Family History  Problem Relation Age of Onset   Heart attack Father 58   Stroke Father    Breast  cancer Paternal Aunt    Ulcerative colitis Neg Hx    Esophageal cancer Neg Hx     Social History:  Social History   Socioeconomic History   Marital status: Single    Spouse name: Not on file   Number of children: Not on file   Years of education: Not on file   Highest education level: Not on file  Occupational History   Not on file  Tobacco Use   Smoking status: Former    Packs/day: 0.50    Types: Cigarettes   Smokeless tobacco: Never   Vaping Use   Vaping Use: Never used  Substance and Sexual Activity   Alcohol use: Yes    Comment: occas   Drug use: Not Currently    Comment: not currently-was +cocaine and amphetamines, clean x3 years   Sexual activity: Yes    Birth control/protection: Pill  Other Topics Concern   Not on file  Social History Narrative   ** Merged History Encounter **       Social Determinants of Health   Financial Resource Strain: Not on file  Food Insecurity: Not on file  Transportation Needs: Not on file  Physical Activity: Not on file  Stress: Not on file  Social Connections: Not on file    Allergies:  Allergies  Allergen Reactions   Lamictal [Lamotrigine] Anaphylaxis, Rash and Other (See Comments)    Stevens-Johnson syndrome and fevers, also   Lamictal [Lamotrigine] Anaphylaxis, Rash and Other (See Comments)    Fevers and Stevens-Johnson syndrome, also   Tramadol Other (See Comments)    Pt had a seizure after taking Tramadol!!   Sulfamethoxazole-Trimethoprim Rash   Erythromycin Nausea And Vomiting   Erythromycin Base Nausea And Vomiting   Geodon [Ziprasidone Hcl] Rash   Levetiracetam Anxiety   Sulfa Antibiotics Rash    Metabolic Disorder Labs: Lab Results  Component Value Date   HGBA1C 5.6 12/19/2020   MPG 114.02 12/19/2020   No results found for: PROLACTIN Lab Results  Component Value Date   TRIG 110 12/18/2020   No results found for: TSH  Therapeutic Level Labs: No results found for: LITHIUM No results found for: VALPROATE No components found for:  CBMZ  Current Medications: Current Outpatient Medications  Medication Sig Dispense Refill   amLODipine (NORVASC) 5 MG tablet Take 1 tablet (5 mg total) by mouth daily. 30 tablet 1   atorvastatin (LIPITOR) 20 MG tablet Take 1 tablet (20 mg total) by mouth at bedtime. 30 tablet 1   desvenlafaxine (PRISTIQ) 50 MG 24 hr tablet Take one and a half tablets daily (75 mg) 45 tablet 2   folic acid (FOLVITE) 1 MG tablet Take  1 tablet (1 mg total) by mouth daily.     gabapentin (NEURONTIN) 600 MG tablet Take 2 tablets (1,200 mg total) by mouth 2 (two) times daily. 120 tablet 2   hydrOXYzine (ATARAX/VISTARIL) 25 MG tablet Take 2 tablets twice daily as needed for anxiety and take 2 tablets at bedtime as needed for sleep 120 tablet 2   mirtazapine (REMERON) 30 MG tablet Take 1 tablet (30 mg total) by mouth at bedtime. 30 tablet 2   Multiple Vitamin (MULTIVITAMIN WITH MINERALS) TABS tablet Take 1 tablet by mouth daily.     OLANZapine (ZYPREXA) 2.5 MG tablet Take 1 tablet (2.5 mg total) by mouth at bedtime as needed (insomnia/sleep). 15 tablet 2   propranolol (INDERAL) 10 MG tablet Take 1 tablet (10 mg total) by mouth every morning. 30 tablet  1   thiamine 100 MG tablet Take 1 tablet (100 mg total) by mouth daily.     No current facility-administered medications for this visit.     Musculoskeletal: Strength & Muscle Tone: within normal limits Gait & Station: normal Patient leans: N/A  Psychiatric Specialty Exam: Review of Systems  Blood pressure (!) 142/103, pulse 76, height 5\' 3"  (1.6 m), weight 160 lb (72.6 kg).Body mass index is 28.34 kg/m.  General Appearance: Well Groomed  Eye Contact:  Good  Speech:  Clear and Coherent and Normal Rate  Volume:  Normal  Mood:  Euthymic  Affect:  Appropriate and Congruent  Thought Process:  Coherent, Goal Directed, and Linear  Orientation:  Full (Time, Place, and Person)  Thought Content: WDL and Logical   Suicidal Thoughts:  No  Homicidal Thoughts:  No  Memory:  Immediate;   Good Recent;   Good Remote;   Good  Judgement:  Good  Insight:  Good  Psychomotor Activity:  Normal  Concentration:  Concentration: Good and Attention Span: Good  Recall:  Good  Fund of Knowledge: Good  Language: Good  Akathisia:  No  Handed:  Right  AIMS (if indicated): not done  Assets:  Communication Skills Desire for Improvement Financial Resources/Insurance Housing  ADL's:  Intact   Cognition: WNL  Sleep:  Good   Screenings: AIMS    Flowsheet Row [facility]  AIMS Total Score 0      AUDIT    Flowsheet Row [facility]  Alcohol Use Disorder Identification Test Final Score (AUDIT) 11 0      GAD-7    Flowsheet Row Clinical Support from 05/09/2021 in [facility] Office Visit from 03/16/2021 in [facility]  Total GAD-7 Score 8 2      PHQ2-9    Flowsheet Row Clinical Support from 05/09/2021 in [facility] Office Visit from 03/16/2021 in [facility]  PHQ-2 Total Score 1 0  PHQ-9 Total Score 7 3      Flowsheet Row Clinical Support from 05/09/2021 in [facility] Office Visit from 03[facility]16/2021 in [facility] ED to Hosp-Admission (Discharged) from 12/17/2020 in [facility]STEPDOWN  C-SSRS RISK CATEGORY No Risk No Risk No Risk        Assessment and Plan: Patient notes that she is feels mentally stable however reports that she is concerned about her blood pressure.  Patient referred to community health and wellness for further evaluation.  No medication changes made today.  Patient agreeable to continue medications as prescribed  1. Major depressive disorder, recurrent episode, in partial remission with anxious distress (HCC)  Continue- gabapentin (NEURONTIN) 600 MG tablet; Take 2 tablets (1,200 mg total) by mouth 2 (two) times daily.  Dispense: 120 tablet; Refill: 2 Continue- mirtazapine (REMERON) 30 MG tablet; Take 1 tablet (30 mg total) by mouth at bedtime.  Dispense: 30 tablet; Refill: 2 Continue- desvenlafaxine (PRISTIQ) 50 MG 24 hr  tablet; Take one and a half tablets daily (75 mg)  Dispense: 45 tablet; Refill: 2 Continue- OLANZapine (ZYPREXA) 2.5 MG tablet; Take 1 tablet (2.5 mg total) by mouth at bedtime as needed (insomnia/sleep).  Dispense: 15 tablet; Refill: 2  2. PTSD (post-traumatic stress disorder)  Continue- hydrOXYzine (ATARAX/VISTARIL) 25 MG  tablet; Take 2 tablets twice daily as needed for anxiety and take 2 tablets at bedtime as needed for sleep  Dispense: 120 tablet; Refill: 2 Continue- mirtazapine (REMERON) 30 MG tablet; Take 1 tablet (30 mg total) by mouth at bedtime.  Dispense: 30 tablet; Refill: 2 Continue- desvenlafaxine (PRISTIQ) 50 MG 24 hr tablet; Take one and a half tablets daily (75 mg)  Dispense: 45 tablet; Refill: 2  3. Primary hypertension  Continue- Ambulatory referral to Internal Medicine    Shanna Cisco, NP 05/09/2021, 2:22 PM

## 2021-05-12 ENCOUNTER — Telehealth (HOSPITAL_COMMUNITY): Payer: Self-pay | Admitting: *Deleted

## 2021-05-12 NOTE — Telephone Encounter (Signed)
Called patient back with information from Dr Doyne Keel that she would not be writing medicine for her BP, including the inderal and she would speak with Dr Delford Field on Monday when she sees him if he is available. She asked if we then thought it was all right for her to go off it cold [place], reiterated Dr Doyne Keel would not write this rx for her and to discuss it Monday with her dr.

## 2021-05-12 NOTE — Telephone Encounter (Signed)
Call from patient stating she has an appt with Dr Delford Field on Monday the 22 nd but until that appt needs Dr Doyne Keel to write a rx for either her Clonidine or her Inderal. Note clearly states she will not write a Rx for the Clonidine, this has already been discussed but will forward the message re Inderal.

## 2021-05-12 NOTE — Telephone Encounter (Signed)
Provider has discussed these concerns with patient.  Provider talk to Dr. Delford Field at his scheduled appointment with her on 05/16/2020.  Patient can call Dr. Lynelle Doctor clinic for refills of medication.

## 2021-05-14 NOTE — Progress Notes (Signed)
New Patient Office Visit  Subjective:  Patient ID: Margaret Mccann, female    DOB: [birth year]  Age: 50 y.o. MRN: [id number]  CC:  Chief Complaint  Patient presents with   New Patient (Initial Visit)   Hypertension    HPI Margaret Mccann presents for PCP to est with HTN   Referred for HTN mgmt and PCP to est by [facility] [name] This patient has a history of PTSD substance use prior suicide attempts, major depression and severe anxiety.  The patient was hospitalized in March with hypoxic respiratory failure and polysubstance overdose.  Patient then went to rehab for a 55-month interval.  Since that time she is in a better mental health state.  Patient is referred by the [facility] for primary care to establish.  She has not had a primary care provider in recent times.  She states that clonidine helps her PTSD and is requesting refills on this and propranolol.  Today on arrival blood pressure is 119/58 pulse 76 and she is already on the clonidine 0.2 mg daily.  She was also given a short-term refill on Zyprexa which she takes at bedtime to help with sleep as needed.  She is requesting refills on this as well.  Note during the March hospitalization the patient had an echocardiogram that showed significant tricuspid regurgitation but normal right heart pressures  Patient does have a gynecologist did have a Pap smear over 3 years ago and is now becoming due.  Patient does need hepatitis C screening.  Note patient's not actively suicidal at this time.  She does have reflux symptoms on a daily basis.  She is on over-the-counter Nexium is requesting a prescription for Protonix.  Patient is due a mammogram.  Patient's mental health stressors are that her fianc hung himself in their home last fall.  Also her sister committed suicide in October.  This resulted in significant issues for this patient.  Below is documentation from the last visit with her mental health  provider.  50 year old female seen today for follow-up psychiatric evaluation.  She is a former patient of Dr. M.Kaur is being transferred to Clinical research associate for medication management.  She has a psychiatric history of depression, anxiety, PTSD, and substance use (cocaine, amphetamine use, stimulants, and alcohol).  She is managed on gabapentin 600 mg twice daily Remeron 30 mg nightly, Pristiq 75 mg daily, Zyprexa 2.5 mg nightly (takes as needed), and hydroxyzine 25 mg 3 times daily.  She notes her medications are somewhat effective in managing her psychiatric conditions.   Today she is well-groomed, pleasant, cooperative, engaged in conversation, maintained eye contact.  She informed provider that since her last visit she has felt mentally stable and reports that she is concerned about her blood pressure.  She notes that for the last 9 days her blood pressure has been out of control.  She notes that when she checked it this morning it was 142/105.  She notes that it has gone as high as 160 /110.  She informed Clinical research associate that she would like to be restarted on clonidine.  Currently patient is taking amlodipine and propanolol.  Provider informed patient that the medications in combination should not be used with clonidine.  She endorsed understanding however she felt it was most effective.  Provider referred patient to community health and wellness.  Provider contacted Dr. Shan Levans regarding seeing the patient sooner than later.  Dr. Ranae Plumber informed writer that she will be put on  the schedule soon.    Patient informed writer that she has minimal anxiety and depression.  Provider conducted a GAD-7 and patient scored an 8.  Provider also conducted PHQ-9 and patient scored a 7.  She informed Clinical research associatewriter that she continues to work at [employer].  She informed Clinical research associatewriter that she finds enjoyment in her job. She endorses adequate sleep and appetite.  Today she denies SI/HI/VAH, mania, paranoia.   No medication changes made  today.  Patient referred to community health and wellness for physical examination as well as medication management.  No other concerns noted at this time.  Assessment and Plan: Patient notes that she is feels mentally stable however reports that she is concerned about her blood pressure.  Patient referred to community health and wellness for further evaluation.  No medication changes made today.  Patient agreeable to continue medications as prescribed   1. Major depressive disorder, recurrent episode, in partial remission with anxious distress (HCC)   Continue- gabapentin (NEURONTIN) 600 MG tablet; Take 2 tablets (1,200 mg total) by mouth 2 (two) times daily.  Dispense: 120 tablet; Refill: 2 Continue- mirtazapine (REMERON) 30 MG tablet; Take 1 tablet (30 mg total) by mouth at bedtime.  Dispense: 30 tablet; Refill: 2 Continue- desvenlafaxine (PRISTIQ) 50 MG 24 hr tablet; Take one and a half tablets daily (75 mg)  Dispense: 45 tablet; Refill: 2 Continue- OLANZapine (ZYPREXA) 2.5 MG tablet; Take 1 tablet (2.5 mg total) by mouth at bedtime as needed (insomnia/sleep).  Dispense: 15 tablet; Refill: 2   2. PTSD (post-traumatic stress disorder)   Continue- hydrOXYzine (ATARAX/VISTARIL) 25 MG tablet; Take 2 tablets twice daily as needed for anxiety and take 2 tablets at bedtime as needed for sleep  Dispense: 120 tablet; Refill: 2 Continue- mirtazapine (REMERON) 30 MG tablet; Take 1 tablet (30 mg total) by mouth at bedtime.  Dispense: 30 tablet; Refill: 2 Continue- desvenlafaxine (PRISTIQ) 50 MG 24 hr tablet; Take one and a half tablets daily (75 mg)  Dispense: 45 tablet; Refill: 2   3. Primary hypertension   Continue- Ambulatory referral to Internal Medicine   Past Medical History:  Diagnosis Date   Alcohol withdrawal seizure with complication, with unspecified complication (HCC) 12/23/2020   Anxiety    Bipolar 1 disorder (HCC)    Depression    Intentional drug overdose (HCC) 08/04/2020   Major  depressive disorder, recurrent episode with anxious distress (HCC) 08/11/2020   Morgellons syndrome    Suicide attempt (HCC)    Suicide by drug overdose (HCC) 08/11/2020   Tricuspid valve regurgitation 12/18/2020    Past Surgical History:  Procedure Laterality Date   APPENDECTOMY      Family History  Problem Relation Age of Onset   Heart attack Father 6650   Stroke Father    Severe combined immunodeficiency Sister    Breast cancer Paternal Aunt    Ulcerative colitis Neg Hx    Esophageal cancer Neg Hx     Social History   Socioeconomic History   Marital status: Single    Spouse name: Not on file   Number of children: Not on file   Years of education: Not on file   Highest education level: Not on file  Occupational History   Not on file  Tobacco Use   Smoking status: Former    Packs/day: 0.50    Types: Cigarettes   Smokeless tobacco: Never  Vaping Use   Vaping Use: Never used  Substance and Sexual Activity   Alcohol  use: Yes    Comment: occas   Drug use: Not Currently    Comment: not currently-was +cocaine and amphetamines, clean x3 years   Sexual activity: Yes    Birth control/protection: Pill  Other Topics Concern   Not on file  Social History Narrative   ** Merged History Encounter **       Social Determinants of Health   Financial Resource Strain: Not on file  Food Insecurity: Not on file  Transportation Needs: Not on file  Physical Activity: Not on file  Stress: Not on file  Social Connections: Not on file  Intimate Partner Violence: Not on file    ROS Review of Systems  HENT: Negative.    Eyes: Negative.   Respiratory: Negative.    Gastrointestinal:        Genella Rife  Endocrine: Negative.   Genitourinary: Negative.   Musculoskeletal: Negative.   Neurological: Negative.   Psychiatric/Behavioral:  Positive for decreased concentration, dysphoric mood and sleep disturbance. Negative for self-injury and suicidal ideas. The patient is nervous/anxious.     Objective:   Today's Vitals: BP (!) 119/58   Pulse 76   Resp 16   Wt 161 lb (73 kg)   SpO2 100%   BMI 28.52 kg/m   Physical Exam Constitutional:      Appearance: Normal appearance. She is normal weight.  HENT:     Head: Normocephalic and atraumatic.     Nose: Nose normal.     Mouth/Throat:     Mouth: Mucous membranes are moist.     Pharynx: Oropharynx is clear.  Eyes:     Extraocular Movements: Extraocular movements intact.     Conjunctiva/sclera: Conjunctivae normal.     Pupils: Pupils are equal, round, and reactive to light.  Cardiovascular:     Rate and Rhythm: Normal rate and regular rhythm.     Pulses: Normal pulses.     Heart sounds: Normal heart sounds. No murmur heard. Pulmonary:     Effort: Pulmonary effort is normal.     Breath sounds: Normal breath sounds.  Abdominal:     General: Abdomen is flat. Bowel sounds are normal.     Palpations: Abdomen is soft.  Musculoskeletal:        General: Normal range of motion.     Cervical back: Normal range of motion and neck supple.  Skin:    General: Skin is warm and dry.  Neurological:     General: No focal deficit present.     Mental Status: She is alert. Mental status is at baseline.  Psychiatric:        Mood and Affect: Mood normal.        Behavior: Behavior normal.        Thought Content: Thought content normal.        Judgment: Judgment normal.    Assessment & Plan:   Problem List Items Addressed This Visit       Cardiovascular and Mediastinum   Tricuspid valve regurgitation    History of tricuspid regurgitation  Will refer to cardiology for follow-up      Relevant Medications   atorvastatin (LIPITOR) 20 MG tablet   cloNIDine (CATAPRES) 0.2 MG tablet   propranolol (INDERAL) 10 MG tablet   Other Relevant Orders   Ambulatory referral to Cardiology   Essential hypertension - Primary    Hypertension triggered by PTSD  We will refill clonidine 0.2 mg daily and continue propranolol 10 mg  daily  Check metabolic panel and blood  counts thyroid function this visit      Relevant Medications   atorvastatin (LIPITOR) 20 MG tablet   cloNIDine (CATAPRES) 0.2 MG tablet   propranolol (INDERAL) 10 MG tablet   Other Relevant Orders   Comprehensive metabolic panel   CBC with Differential/Platelet   Thyroid Panel With TSH     Nervous and Auditory   RESOLVED: Amphetamine and psychostimulant-induced psychosis with hallucinations (HCC)    This has resolved      RESOLVED: Delirium tremens (HCC)    Due to excess alcohol use this has resolved      RESOLVED: Alcohol withdrawal seizure with complication, with unspecified complication (HCC)    This has resolved        Genitourinary   AKI (acute kidney injury) (HCC)    Had resolved by time of discharge we will repeat metabolic profile this visit        Other   Major depressive disorder, recurrent severe without psychotic features (HCC)    Management per psychiatry      Relevant Medications   FLUoxetine (PROZAC) 40 MG capsule   PTSD (post-traumatic stress disorder)    Continue propranolol and clonidine management per psychiatry      Relevant Medications   FLUoxetine (PROZAC) 40 MG capsule   propranolol (INDERAL) 10 MG tablet   Mixed hyperlipidemia    Repeat lipid panel      Relevant Medications   atorvastatin (LIPITOR) 20 MG tablet   cloNIDine (CATAPRES) 0.2 MG tablet   propranolol (INDERAL) 10 MG tablet   Other Relevant Orders   Lipid panel   RESOLVED: Suicide attempt (HCC)    Not currently suicidal      RESOLVED: Benzodiazepine abuse (HCC)    Currently has resolved benzodiazepine use      Other Visit Diagnoses     Major depressive disorder, recurrent episode, in partial remission with anxious distress (HCC)       Relevant Medications   FLUoxetine (PROZAC) 40 MG capsule   OLANZapine (ZYPREXA) 2.5 MG tablet   Colon cancer screening       Relevant Orders   Fecal occult blood, imunochemical   Need for  hepatitis C screening test       Relevant Orders   HCV Ab w Reflex to Quant PCR   Encounter for screening mammogram for malignant neoplasm of breast       Relevant Orders   MM DIGITAL SCREENING BILATERAL       Outpatient Encounter Medications as of 05/16/2021  Medication Sig   desvenlafaxine (PRISTIQ) 50 MG 24 hr tablet Take one and a half tablets daily (75 mg)   FLUoxetine (PROZAC) 40 MG capsule Take 40 mg by mouth daily.   folic acid (FOLVITE) 1 MG tablet Take 1 tablet (1 mg total) by mouth daily.   gabapentin (NEURONTIN) 600 MG tablet Take 2 tablets (1,200 mg total) by mouth 2 (two) times daily.   hydrOXYzine (ATARAX/VISTARIL) 25 MG tablet Take 2 tablets twice daily as needed for anxiety and take 2 tablets at bedtime as needed for sleep   mirtazapine (REMERON) 30 MG tablet Take 1 tablet (30 mg total) by mouth at bedtime.   Multiple Vitamin (MULTIVITAMIN WITH MINERALS) TABS tablet Take 1 tablet by mouth daily.   pantoprazole (PROTONIX) 20 MG tablet Take 1 tablet (20 mg total) by mouth daily.   thiamine 100 MG tablet Take 1 tablet (100 mg total) by mouth daily.   [DISCONTINUED] atorvastatin (LIPITOR) 20 MG tablet Take 1 tablet (20  mg total) by mouth at bedtime.   [DISCONTINUED] cloNIDine (CATAPRES) 0.2 MG tablet Take 0.2 mg by mouth at bedtime.   [DISCONTINUED] esomeprazole (NEXIUM) 20 MG capsule Take 20 mg by mouth daily.   atorvastatin (LIPITOR) 20 MG tablet Take 1 tablet (20 mg total) by mouth at bedtime.   cloNIDine (CATAPRES) 0.2 MG tablet Take 1 tablet (0.2 mg total) by mouth at bedtime.   OLANZapine (ZYPREXA) 2.5 MG tablet Take 1 tablet (2.5 mg total) by mouth at bedtime as needed (insomnia/sleep).   propranolol (INDERAL) 10 MG tablet Take 1 tablet (10 mg total) by mouth every morning.   [DISCONTINUED] amLODipine (NORVASC) 5 MG tablet Take 1 tablet (5 mg total) by mouth daily. (Patient not taking: Reported on 05/16/2021)   [DISCONTINUED] hydrOXYzine (VISTARIL) 25 MG capsule Take by  mouth.   [DISCONTINUED] OLANZapine (ZYPREXA) 2.5 MG tablet Take 1 tablet (2.5 mg total) by mouth at bedtime as needed (insomnia/sleep). (Patient not taking: Reported on 05/16/2021)   [DISCONTINUED] propranolol (INDERAL) 10 MG tablet Take 1 tablet (10 mg total) by mouth every morning. (Patient not taking: Reported on 05/16/2021)   No facility-administered encounter medications on file as of 05/16/2021.    Follow-up: Return in about 4 months (around 09/15/2021).   Shan Levans, MD

## 2021-05-16 ENCOUNTER — Encounter: Payer: Self-pay | Admitting: Critical Care Medicine

## 2021-05-16 ENCOUNTER — Ambulatory Visit: Payer: Self-pay | Admitting: Critical Care Medicine

## 2021-05-16 ENCOUNTER — Ambulatory Visit: Payer: Self-pay | Attending: Critical Care Medicine | Admitting: Critical Care Medicine

## 2021-05-16 ENCOUNTER — Other Ambulatory Visit: Payer: Self-pay

## 2021-05-16 VITALS — BP 119/58 | HR 76 | Resp 16 | Wt 161.0 lb

## 2021-05-16 DIAGNOSIS — Z1159 Encounter for screening for other viral diseases: Secondary | ICD-10-CM

## 2021-05-16 DIAGNOSIS — F332 Major depressive disorder, recurrent severe without psychotic features: Secondary | ICD-10-CM

## 2021-05-16 DIAGNOSIS — Z1211 Encounter for screening for malignant neoplasm of colon: Secondary | ICD-10-CM

## 2021-05-16 DIAGNOSIS — F10231 Alcohol dependence with withdrawal delirium: Secondary | ICD-10-CM

## 2021-05-16 DIAGNOSIS — F1310 Sedative, hypnotic or anxiolytic abuse, uncomplicated: Secondary | ICD-10-CM

## 2021-05-16 DIAGNOSIS — E782 Mixed hyperlipidemia: Secondary | ICD-10-CM

## 2021-05-16 DIAGNOSIS — F10931 Alcohol use, unspecified with withdrawal delirium: Secondary | ICD-10-CM

## 2021-05-16 DIAGNOSIS — N179 Acute kidney failure, unspecified: Secondary | ICD-10-CM

## 2021-05-16 DIAGNOSIS — F15951 Other stimulant use, unspecified with stimulant-induced psychotic disorder with hallucinations: Secondary | ICD-10-CM

## 2021-05-16 DIAGNOSIS — I10 Essential (primary) hypertension: Secondary | ICD-10-CM

## 2021-05-16 DIAGNOSIS — I071 Rheumatic tricuspid insufficiency: Secondary | ICD-10-CM

## 2021-05-16 DIAGNOSIS — F3341 Major depressive disorder, recurrent, in partial remission: Secondary | ICD-10-CM

## 2021-05-16 DIAGNOSIS — F10239 Alcohol dependence with withdrawal, unspecified: Secondary | ICD-10-CM

## 2021-05-16 DIAGNOSIS — R569 Unspecified convulsions: Secondary | ICD-10-CM

## 2021-05-16 DIAGNOSIS — T1491XA Suicide attempt, initial encounter: Secondary | ICD-10-CM

## 2021-05-16 DIAGNOSIS — F418 Other specified anxiety disorders: Secondary | ICD-10-CM

## 2021-05-16 DIAGNOSIS — Z1231 Encounter for screening mammogram for malignant neoplasm of breast: Secondary | ICD-10-CM

## 2021-05-16 DIAGNOSIS — F4310 Post-traumatic stress disorder, unspecified: Secondary | ICD-10-CM

## 2021-05-16 MED ORDER — PROPRANOLOL HCL 10 MG PO TABS
10.0000 mg | ORAL_TABLET | ORAL | 4 refills | Status: DC
Start: 2021-05-16 — End: 2021-09-21

## 2021-05-16 MED ORDER — PANTOPRAZOLE SODIUM 20 MG PO TBEC: 20.0000 mg | DELAYED_RELEASE_TABLET | Freq: Every day | ORAL | 4 refills | Status: DC

## 2021-05-16 MED ORDER — CLONIDINE HCL 0.2 MG PO TABS: 0.2000 mg | ORAL_TABLET | Freq: Every day | ORAL | 4 refills | Status: DC

## 2021-05-16 MED ORDER — OLANZAPINE 2.5 MG PO TABS: 2.5000 mg | ORAL_TABLET | Freq: Every evening | ORAL | 2 refills | Status: DC | PRN

## 2021-05-16 MED ORDER — ATORVASTATIN CALCIUM 20 MG PO TABS: 20.0000 mg | ORAL_TABLET | Freq: Every evening | ORAL | 1 refills | Status: DC

## 2021-05-16 NOTE — Assessment & Plan Note (Signed)
Due to excess alcohol use this has resolved

## 2021-05-16 NOTE — Assessment & Plan Note (Signed)
Not currently suicidal °

## 2021-05-16 NOTE — Assessment & Plan Note (Signed)
Had resolved by time of discharge we will repeat metabolic profile this visit

## 2021-05-16 NOTE — Assessment & Plan Note (Signed)
Hypertension triggered by PTSD  We will refill clonidine 0.2 mg daily and continue propranolol 10 mg daily  Check metabolic panel and blood counts thyroid function this visit

## 2021-05-16 NOTE — Patient Instructions (Addendum)
For free mammogram call:   [phone]  Referral to cardiology was made  Follow reflux diet as attached  A fecal occult kit was given for colon cancer screening  Refills on your blood pressure medications and some of your other medications were sent to your pharmacy that you requested  Return to see Dr. Joya Gaskins 4 months

## 2021-05-16 NOTE — Assessment & Plan Note (Signed)
Management per psychiatry 

## 2021-05-16 NOTE — Assessment & Plan Note (Signed)
This has resolved.

## 2021-05-16 NOTE — Assessment & Plan Note (Signed)
Currently has resolved benzodiazepine use

## 2021-05-16 NOTE — Assessment & Plan Note (Signed)
History of tricuspid regurgitation  Will refer to cardiology for follow-up

## 2021-05-16 NOTE — Assessment & Plan Note (Signed)
Continue propranolol and clonidine management per psychiatry

## 2021-05-16 NOTE — Progress Notes (Signed)
Pt is requesting a refill on Clonidine and Propranolol

## 2021-05-16 NOTE — Assessment & Plan Note (Signed)
Repeat lipid panel ?

## 2021-05-17 LAB — COMPREHENSIVE METABOLIC PANEL
ALT: 32 IU/L (ref 0–32)
AST: 22 IU/L (ref 0–40)
Albumin/Globulin Ratio: 1.8 (ref 1.2–2.2)
Albumin: 4.3 g/dL (ref 3.8–4.8)
Alkaline Phosphatase: 97 IU/L (ref 44–121)
BUN/Creatinine Ratio: 24 — ABNORMAL HIGH (ref 9–23)
BUN: 19 mg/dL (ref 6–24)
Bilirubin Total: 0.2 mg/dL (ref 0.0–1.2)
CO2: 22 mmol/L (ref 20–29)
Calcium: 9.7 mg/dL (ref 8.7–10.2)
Chloride: 105 mmol/L (ref 96–106)
Creatinine, Ser: 0.78 mg/dL (ref 0.57–1.00)
Globulin, Total: 2.4 g/dL (ref 1.5–4.5)
Glucose: 94 mg/dL (ref 65–99)
Potassium: 4.4 mmol/L (ref 3.5–5.2)
Sodium: 142 mmol/L (ref 134–144)
Total Protein: 6.7 g/dL (ref 6.0–8.5)
eGFR: 94 mL/min/{1.73_m2} (ref >59)

## 2021-05-17 LAB — LIPID PANEL
Chol/HDL Ratio: 1.9 ratio (ref 0.0–4.4)
Cholesterol, Total: 183 mg/dL (ref 100–199)
HDL: 94 mg/dL (ref >39)
LDL Chol Calc (NIH): 69 mg/dL (ref 0–99)
Triglycerides: 118 mg/dL (ref 0–149)
VLDL Cholesterol Cal: 20 mg/dL (ref 5–40)

## 2021-05-17 LAB — CBC WITH DIFFERENTIAL/PLATELET
Basophils Absolute: 0 10*3/uL (ref 0.0–0.2)
Basos: 0 %
EOS (ABSOLUTE): 0.2 10*3/uL (ref 0.0–0.4)
Eos: 3 %
Hematocrit: 39.7 % (ref 34.0–46.6)
Hemoglobin: 13.4 g/dL (ref 11.1–15.9)
Immature Grans (Abs): 0 10*3/uL (ref 0.0–0.1)
Immature Granulocytes: 0 %
Lymphocytes Absolute: 3.1 10*3/uL (ref 0.7–3.1)
Lymphs: 47 %
MCH: 29.9 pg (ref 26.6–33.0)
MCHC: 33.8 g/dL (ref 31.5–35.7)
MCV: 89 fL (ref 79–97)
Monocytes Absolute: 0.5 10*3/uL (ref 0.1–0.9)
Monocytes: 7 %
Neutrophils Absolute: 2.9 10*3/uL (ref 1.4–7.0)
Neutrophils: 43 %
Platelets: 358 10*3/uL (ref 150–450)
RBC: 4.48 x10E6/uL (ref 3.77–5.28)
RDW: 13.4 % (ref 11.7–15.4)
WBC: 6.8 10*3/uL (ref 3.4–10.8)

## 2021-05-17 LAB — HCV INTERPRETATION

## 2021-05-17 LAB — THYROID PANEL WITH TSH
Free Thyroxine Index: 1.2 (ref 1.2–4.9)
T3 Uptake Ratio: 23 % — ABNORMAL LOW (ref 24–39)
T4, Total: 5.1 ug/dL (ref 4.5–12.0)
TSH: 1.3 u[IU]/mL (ref 0.450–4.500)

## 2021-05-17 LAB — HCV AB W REFLEX TO QUANT PCR: HCV Ab: <0.1 s/co ratio (ref 0.0–0.9)

## 2021-06-16 ENCOUNTER — Ambulatory Visit: Payer: Self-pay | Admitting: *Deleted

## 2021-06-16 NOTE — Telephone Encounter (Signed)
Reason for Disposition  SEVERE (e.g., excruciating) throat pain  Answer Assessment - Initial Assessment Questions 1. ONSET: "When did the throat start hurting?" (Hours or days ago)      This morning 2. SEVERITY: "How bad is the sore throat?" (Scale 1-10; mild, moderate or severe)   - MILD (1-3):  doesn't interfere with eating or normal activities   - MODERATE (4-7): interferes with eating some solids and normal activities   - SEVERE (8-10):  excruciating pain, interferes with most normal activities   - SEVERE DYSPHAGIA: can't swallow liquids, drooling     7/10 3. STREP EXPOSURE: "Has there been any exposure to strep within the past week?" If Yes, ask: "What type of contact occurred?"      Yes. workplace 4.  VIRAL SYMPTOMS: "Are there any symptoms of a cold, such as a runny nose, cough, hoarse voice or red eyes?"      no 5. FEVER: "Do you have a fever?" If Yes, ask: "What is your temperature, how was it measured, and when did it start?"     101.2 6. PUS ON THE TONSILS: "Is there pus on the tonsils in the back of your throat?"     Unsure 7. OTHER SYMPTOMS: "Do you have any other symptoms?" (e.g., difficulty breathing, headache, rash)     Chills and body aches  Protocols used: Sore Throat-A-AH

## 2021-06-16 NOTE — Telephone Encounter (Signed)
Pt reports sore throat and fever 101.2, onset this AM. States exposure to coworker that has tested positive for strep. Also reports chills and body aches. Denies any cough, no congestion.Rates pain at 7/10. No availability at practice within 24 hour timeframe. Advised UC or mobile unit. Pt states will go to mobile unit. Assured pt NT would route to practice for PCPs review.

## 2021-06-17 NOTE — Telephone Encounter (Signed)
Advise Urgent care , she prob has covid or [facility]

## 2021-06-17 NOTE — Telephone Encounter (Signed)
Called pt made aware of MD message / stated will go to UC is not feeling any better today .

## 2021-06-20 ENCOUNTER — Other Ambulatory Visit: Payer: Self-pay

## 2021-06-20 DIAGNOSIS — Z1231 Encounter for screening mammogram for malignant neoplasm of breast: Secondary | ICD-10-CM

## 2021-06-20 NOTE — Addendum Note (Signed)
Addended by: Narda Rutherford on: 06/20/2021 12:58 PM   Modules accepted: Orders

## 2021-06-23 ENCOUNTER — Other Ambulatory Visit: Payer: Self-pay | Admitting: Critical Care Medicine

## 2021-06-23 DIAGNOSIS — Z1231 Encounter for screening mammogram for malignant neoplasm of breast: Secondary | ICD-10-CM

## 2021-06-30 ENCOUNTER — Inpatient Hospital Stay: Admission: RE | Admit: 2021-06-30 | Payer: Self-pay | Source: Ambulatory Visit

## 2021-07-18 ENCOUNTER — Ambulatory Visit: Payer: Self-pay | Admitting: Cardiovascular Disease

## 2021-07-20 IMAGING — CT CT CHEST-ABD-PELV W/ CM
2 of 5 series · 11 of 36 positions shown, 16 images · IV contrast (omnipaque)
Comparison: CT chest 08/06/2003

CLINICAL DATA: Chest pain, acute abdominal pain

EXAM:
CT CHEST, ABDOMEN, AND PELVIS WITH CONTRAST
TECHNIQUE: Multidetector CT imaging of the chest, abdomen and pelvis was
performed following the standard protocol during bolus
administration of intravenous contrast.
CONTRAST:  100mL OMNIPAQUE IOHEXOL 350 MG/ML SOLN

[Series 8: cap with 5mm st · axial · 0.78mm/px · z∈[+892,+1357]mm · 8 of 121 slices shown, 13 images]
[im 14/121  mediastinal]
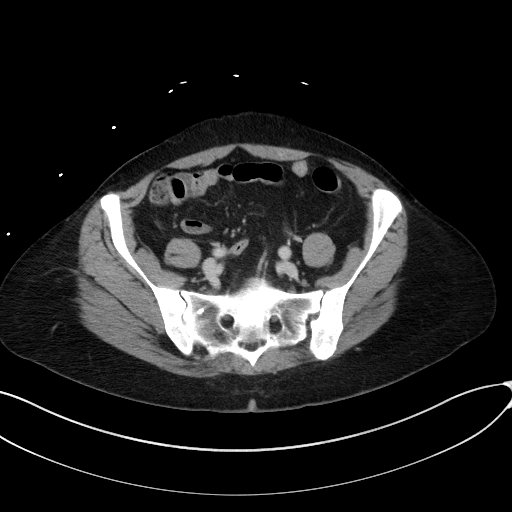
[im 14/121  bone]
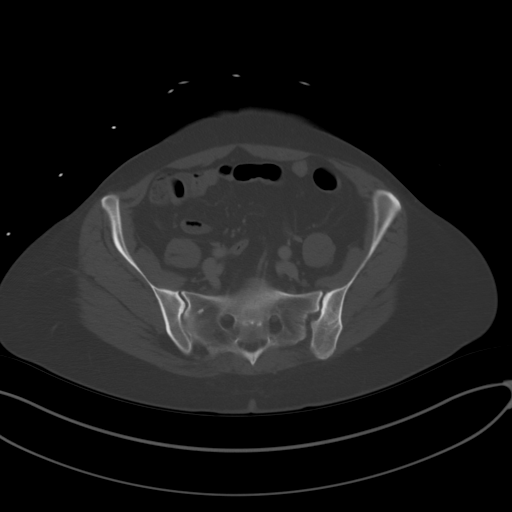
[im 27/121  mediastinal]
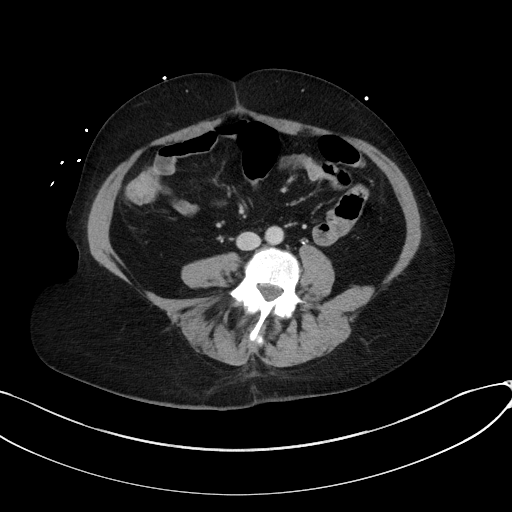
[im 41/121  mediastinal]
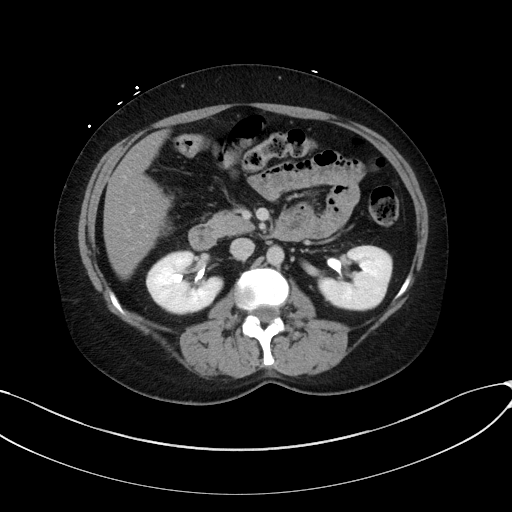
[im 54/121  mediastinal]
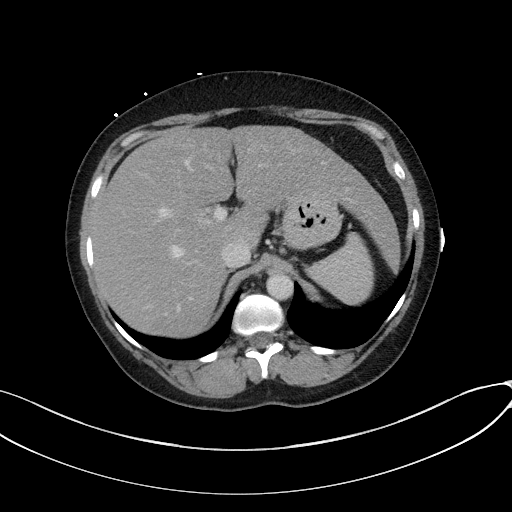
[im 67/121  mediastinal]
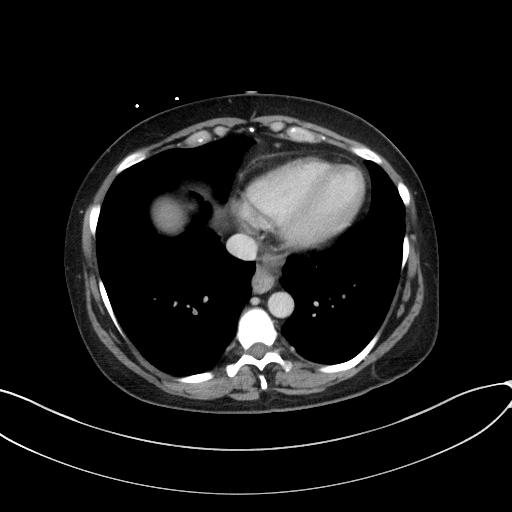
[im 67/121  lung]
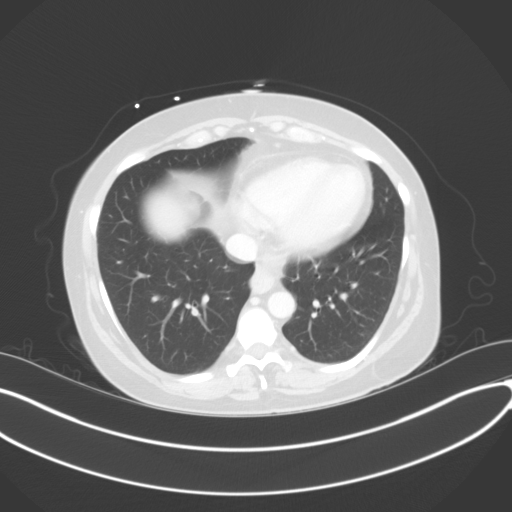
[im 81/121  mediastinal]
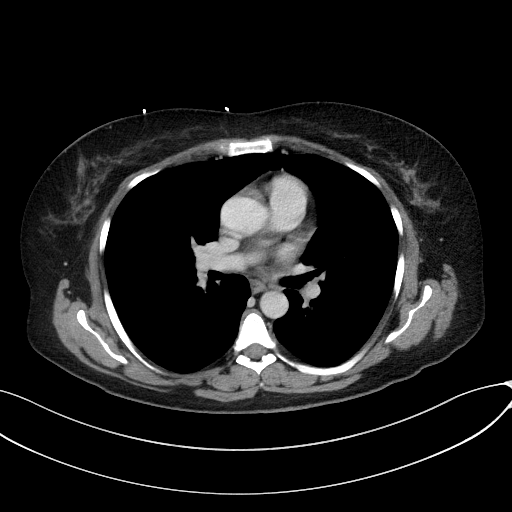
[im 81/121  lung]
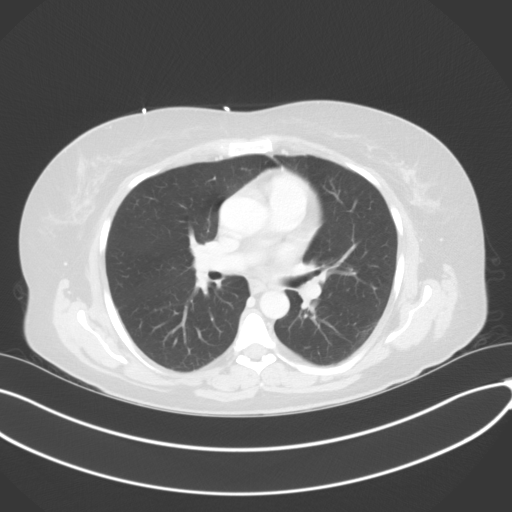
[im 94/121  mediastinal]
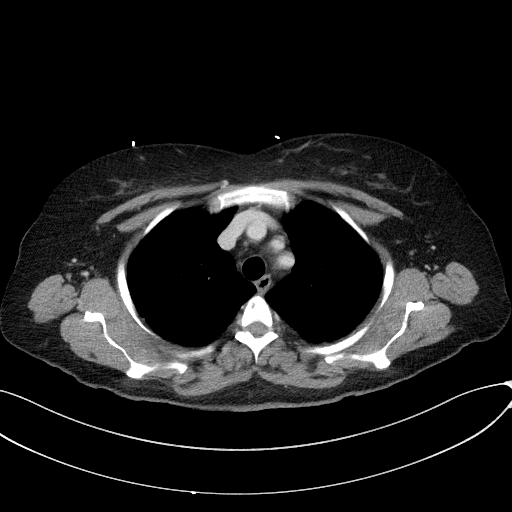
[im 94/121  lung]
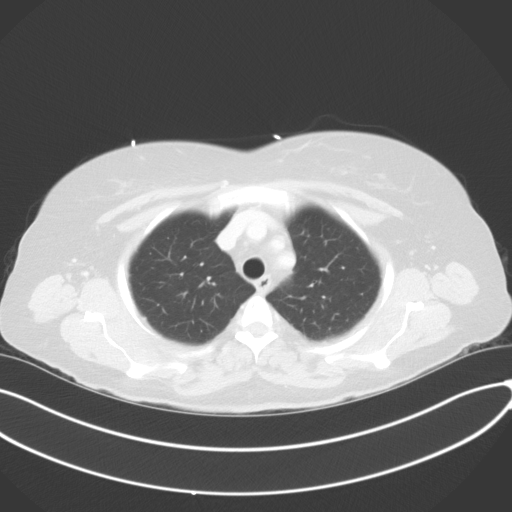
[im 107/121  mediastinal]
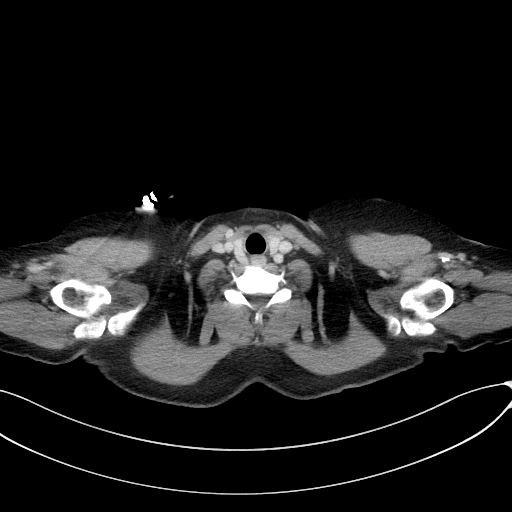
[im 107/121  lung]
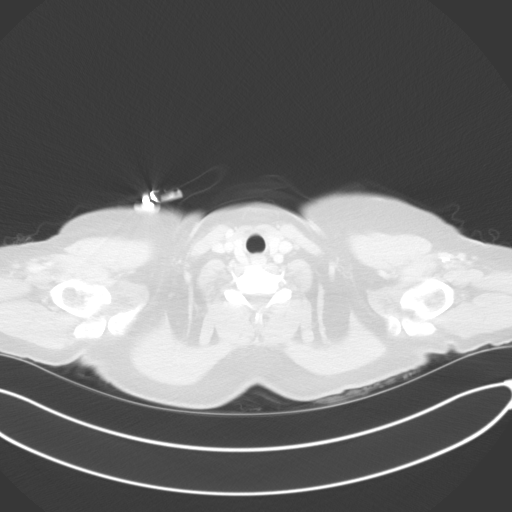

[Series 10: cap with 3mm st cor · coronal · 0.64mm/px · 3 of 151 slices shown]
[im 31/151  mediastinal]
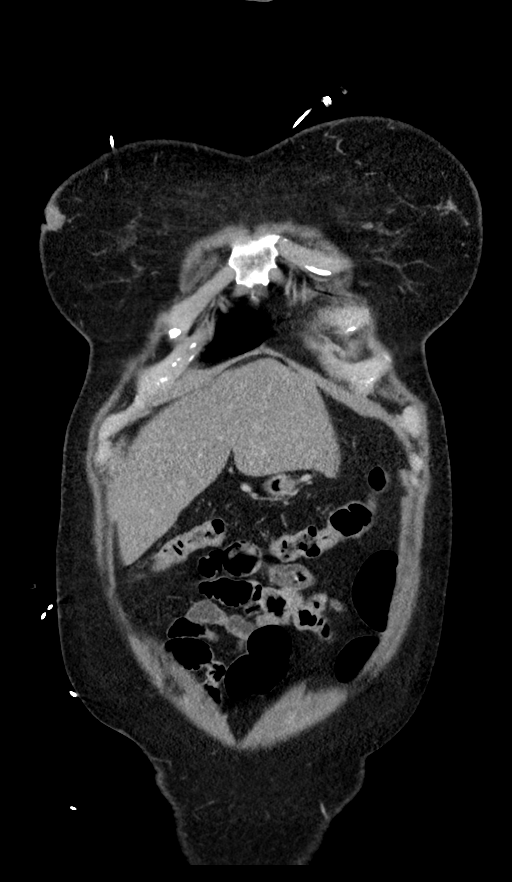
[im 61/151  mediastinal]
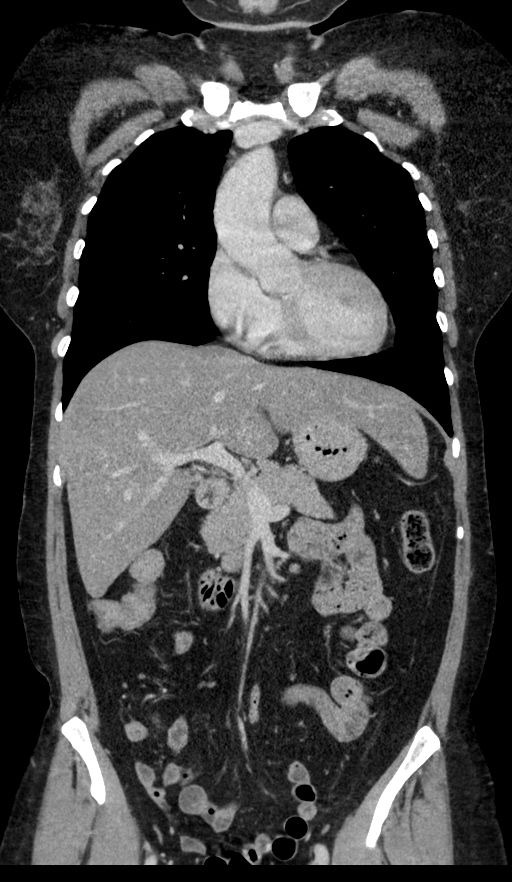
[im 91/151  mediastinal]
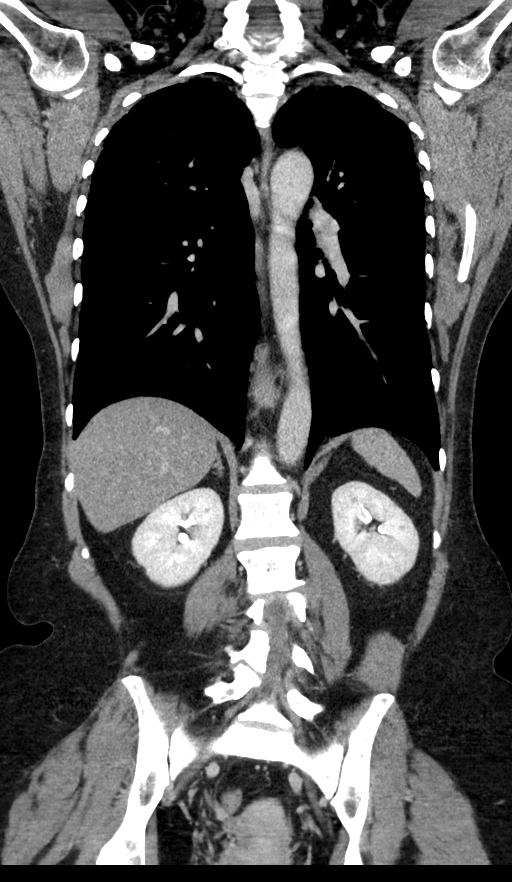

[11 of 36 positions shown; findings below may reference images not displayed]

FINDINGS: Due to technical malfunction of the scanner, the study, originally
ordered as a CT arteriogram, resulted in relatively poor bolus
timing and portal venous phase opacification. Additionally, the
inferior pelvis including the bladder and terminal rectum are
excluded from view. The examination, however, is still diagnostic
for the visualized segment.

CT CHEST FINDINGS

Cardiovascular: Cardiac size within normal limits. No significant
coronary artery calcification. No pericardial effusion. Central
pulmonary arteries are of normal caliber. The thoracic aorta is
normal.

Mediastinum/Nodes: Thyroid unremarkable. No pathologic thoracic
adenopathy. Esophagus unremarkable. Moderate hiatal hernia noted.

Lungs/Pleura: Minimal subpleural nodular infiltrate within the right
lower lobe, axial image # 37, is nonspecific, but may be infectious
or inflammatory in nature. Adjacent pulmonary nodule as well as
ground-glass nodule on image # 32 are stable prior examination and
are safely considered benign. The lungs are otherwise clear. No
pneumothorax or pleural effusion.

Musculoskeletal: Osseous structures are age-appropriate. No acute
bone abnormality.

CT ABDOMEN PELVIS FINDINGS

Hepatobiliary: Moderate hepatic steatosis. No enhancing liver
lesion. No intra or extrahepatic biliary ductal dilation.
Gallbladder unremarkable.

Pancreas: Unremarkable

Spleen: Unremarkable

Adrenals/Urinary Tract: The adrenal glands are normal. The kidneys
are normal in size and position. Contrast opacifies the renal
collecting systems. No hydronephrosis. No enhancing intrarenal mass.
No definite intrarenal calcifications identified.

Stomach/Bowel: Appendectomy has been performed. The visualized large
and small bowel are unremarkable. No free intraperitoneal gas or
significant free fluid.

Vascular/Lymphatic: The abdominal vasculature is unremarkable. No
pathologic adenopathy within the abdomen and visualized pelvis.

Reproductive: Uterus and bilateral adnexa are unremarkable.

Other: Tiny broad-based fat containing umbilical hernia.

Musculoskeletal: There is mild lumbar levoscoliosis with congenital
nonunion of the left third transverse process and an accessory
transverse process seen within the left paraspinal region between L2
and L3. Associated degenerative changes along the concavity. No
acute bone abnormality.
IMPRESSION: No acute intrathoracic or intra-abdominal pathology identified. Note
that the bladder and terminal rectum are not included on this
examination.

Multiple right lower lobe pulmonary nodules, safely considered
benign given their stability over time. Minimal subpleural nodular
infiltrate is nonspecific and may be infectious or inflammatory.

Moderate hepatic steatosis.

## 2021-08-01 ENCOUNTER — Emergency Department (HOSPITAL_COMMUNITY)
Admission: EM | Admit: 2021-08-01 | Discharge: 2021-08-01 | Disposition: A | Discharge status: Home / Self Care | Payer: Self-pay | Attending: Student | Admitting: Student

## 2021-08-01 DIAGNOSIS — I10 Essential (primary) hypertension: Secondary | ICD-10-CM | POA: Insufficient documentation

## 2021-08-01 DIAGNOSIS — L0291 Cutaneous abscess, unspecified: Secondary | ICD-10-CM

## 2021-08-01 DIAGNOSIS — Z87891 Personal history of nicotine dependence: Secondary | ICD-10-CM | POA: Insufficient documentation

## 2021-08-01 DIAGNOSIS — Z79899 Other long term (current) drug therapy: Secondary | ICD-10-CM | POA: Insufficient documentation

## 2021-08-01 DIAGNOSIS — N611 Abscess of the breast and nipple: Secondary | ICD-10-CM | POA: Insufficient documentation

## 2021-08-01 LAB — COMPREHENSIVE METABOLIC PANEL
ALT: 33 U/L (ref 0–44)
AST: 24 U/L (ref 15–41)
Albumin: 4.3 g/dL (ref 3.5–5.0)
Alkaline Phosphatase: 88 U/L (ref 38–126)
Anion gap: 11 (ref 5–15)
BUN: 20 mg/dL (ref 6–20)
CO2: 21 mmol/L — ABNORMAL LOW (ref 22–32)
Calcium: 9.4 mg/dL (ref 8.9–10.3)
Chloride: 104 mmol/L (ref 98–111)
Creatinine, Ser: 0.64 mg/dL (ref 0.44–1.00)
GFR, Estimated: >60 mL/min (ref >60)
Glucose, Bld: 83 mg/dL (ref 70–99)
Potassium: 4.2 mmol/L (ref 3.5–5.1)
Sodium: 136 mmol/L (ref 135–145)
Total Bilirubin: 0.4 mg/dL (ref 0.3–1.2)
Total Protein: 8.1 g/dL (ref 6.5–8.1)

## 2021-08-01 LAB — CBC WITH DIFFERENTIAL/PLATELET
Abs Immature Granulocytes: 0.04 10*3/uL (ref 0.00–0.07)
Basophils Absolute: 0.1 10*3/uL (ref 0.0–0.1)
Basophils Relative: 1 %
Eosinophils Absolute: 0.2 10*3/uL (ref 0.0–0.5)
Eosinophils Relative: 2 %
HCT: 40.1 % (ref 36.0–46.0)
Hemoglobin: 13.5 g/dL (ref 12.0–15.0)
Immature Granulocytes: 0 %
Lymphocytes Relative: 46 %
Lymphs Abs: 4.1 10*3/uL — ABNORMAL HIGH (ref 0.7–4.0)
MCH: 30.7 pg (ref 26.0–34.0)
MCHC: 33.7 g/dL (ref 30.0–36.0)
MCV: 91.1 fL (ref 80.0–100.0)
Monocytes Absolute: 0.9 10*3/uL (ref 0.1–1.0)
Monocytes Relative: 10 %
Neutro Abs: 3.7 10*3/uL (ref 1.7–7.7)
Neutrophils Relative %: 41 %
Platelets: 348 10*3/uL (ref 150–400)
RBC: 4.4 MIL/uL (ref 3.87–5.11)
RDW: 13.9 % (ref 11.5–15.5)
WBC: 9 10*3/uL (ref 4.0–10.5)
nRBC: 0 % (ref 0.0–0.2)

## 2021-08-01 MED ORDER — OXYCODONE-ACETAMINOPHEN 5-325 MG PO TABS
1.0000 | ORAL_TABLET | Freq: Once | ORAL | Status: AC
  Administered 2021-08-01: 1 via ORAL
  Filled 2021-08-01: qty 1

## 2021-08-01 MED ORDER — DOXYCYCLINE HYCLATE 100 MG PO TABS
100.0000 mg | ORAL_TABLET | Freq: Once | ORAL | Status: AC
  Administered 2021-08-01: 100 mg via ORAL
  Filled 2021-08-01: qty 1

## 2021-08-01 MED ORDER — DOXYCYCLINE HYCLATE 100 MG PO CAPS: 100.0000 mg | ORAL_CAPSULE | Freq: Two times a day (BID) | ORAL | 0 refills | Status: AC

## 2021-08-01 MED ORDER — OXYCODONE-ACETAMINOPHEN 5-325 MG PO TABS: 1.0000 | ORAL_TABLET | Freq: Three times a day (TID) | ORAL | 0 refills | Status: DC | PRN

## 2021-08-01 NOTE — ED Notes (Signed)
Pt was seen by PA and abcess drained.  Pt given gauze to place on abscess and pt is getting dressed

## 2021-08-01 NOTE — Discharge Instructions (Signed)
You have a abscess under your right breast have started on antibiotics please take as prescribed.  Please apply warm compresses to the area do this 2-3 times daily as this help bring out the infection.  Please keep the wound clean and just rinse it off a few times daily.I have given you a short course of narcotics please take as prescribed.  This medication can make you drowsy do not consume alcohol or operate heavy machinery when taking this medication.  This medication is Tylenol in it do not take Tylenol and take this medication.    Please follow-up with your OB/GYN for further evaluation.  If symptoms not improve after 72 hours and you have worsening rash, pain, discharge, fevers, chills you will need further evaluation.

## 2021-08-01 NOTE — ED Provider Notes (Signed)
Emergency Medicine Provider Triage Evaluation Note  Margaret Mccann , a 50 y.o. female  was evaluated in triage.  Pt complains of worsening rash to her central right chest just medial and inferior to the right breast for 5 days.  States that the area of concern began as a "pimple looking" area which has since opened up and started draining.  States that after this occurred she has had spreading rash to her abdomen and chest that she describes as painful.  She has tried Epsom salt and turmeric without relief of symptoms.  She denies itching.  Denies fever.  Denies recent changes to detergents, new medications, hiking or traveling, environmental allergies.  She does states she bought new bras and thought this may have been the cause of symptoms that she did not wash them before wearing them.  Denies shortness of breath, lip or tongue swelling.  Not diabetic or on chronic immunosuppressants.  Review of Systems  Positive: Rash, wound Negative: Fever, chills, nausea vomiting  Physical Exam  BP 118/84 (BP Location: Left Arm)   Pulse 85   Temp 99.6 F (37.6 C) (Oral)   Resp 16   SpO2 97%  Gen:   Awake, no distress   Resp:  Normal effort  MSK:   Moves extremities without difficulty  HEENT: Oropharynx is clear without erythema, lesions or rash present Other:  1 to 2 cm open wound to the right medial chest wall with obvious purulence.  Surrounding erythema up to 4 cm in diameter around the wound.  There are 2-3 papules present on the abdomen and chest.  Diffuse red maculopapular rash that appears lacy spreading to the abdomen and right chest and breast.  Medical Decision Making  Medically screening exam initiated at 12:13 PM.  Appropriate orders placed.  Margaret Mccann Margaret Mccann was informed that the remainder of the evaluation will be completed by another provider, this initial triage assessment does not replace that evaluation, and the importance of remaining in the ED until their  evaluation is complete.     Cristopher Peru, PA-C 08/01/21 1219    Gwyneth Sprout, MD 08/01/21 1430

## 2021-08-01 NOTE — ED Triage Notes (Signed)
Pt states she has a bump under her right breast that is now infected. States she has tried to treat this at home with epsom salts.

## 2021-08-01 NOTE — ED Provider Notes (Signed)
[facility]-EMERGENCY DEPT Provider Note   CSN: [id number] Arrival date & time: 08/01/21  1145     History Chief Complaint  Patient presents with   Abscess    Margaret Mccann Margaret Mccann is a 50 y.o. female.  HPI  Patient with significant medical history including depression, polysubstance use who presents to the emergency department with chief complaint of wound under her right breast.  Patient states she noticed this about 5 days ago, initially started off as a rash that was beneath her breasts bilaterally the rash was itchy at first, started to picking on it and then developed a wound under her right breast.  The wound has gotten larger and became more painful, describes as a stinging like sensation, she notes some purulent discharge coming from the area, and has gotten slightly larger in size has had intermittent subjective fevers and chills.  She denies systemic rash, tongue, throat, lip swelling, difficulty breathing, denies any GI symptoms.  She states that she is not immunocompromise, has no history of MRSA infections, has not been on any antibiotics for this.  She is never had this in the past, denies any leaving or aggravating factors.  Past Medical History:  Diagnosis Date   Alcohol withdrawal seizure with complication, with unspecified complication (HCC) 12/23/2020   Anxiety    Bipolar 1 disorder (HCC)    Depression    Intentional drug overdose (HCC) 08/04/2020   Major depressive disorder, recurrent episode with anxious distress (HCC) 08/11/2020   Morgellons syndrome    Suicide attempt (HCC)    Suicide by drug overdose (HCC) 08/11/2020   Tricuspid valve regurgitation 12/18/2020    Patient Active Problem List   Diagnosis Date Noted   Essential hypertension 03/16/2021   Mixed hyperlipidemia 03/16/2021   Tricuspid valve regurgitation 12/18/2020   AKI (acute kidney injury) (HCC) 12/17/2020   Grief 07/30/2020   PTSD (post-traumatic stress disorder)  07/30/2020   Major depressive disorder, 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     Family History  Problem Relation Age of Onset   Heart attack Father 41   Stroke Father    Severe combined immunodeficiency Sister    Breast cancer Paternal Aunt    Ulcerative colitis Neg Hx    Esophageal cancer Neg Hx     Social History   Tobacco Use   Smoking status: Former    Packs/day: 0.50    Types: Cigarettes   Smokeless tobacco: Never  Vaping Use   Vaping Use: Never used  Substance Use Topics   Alcohol use: Yes    Comment: occas   Drug use: Not Currently    Comment: not currently-was +cocaine and amphetamines, clean x3 years    Home Medications Prior to Admission medications   Medication Sig Start Date End Date Taking? Authorizing Provider  atorvastatin (LIPITOR) 20 MG tablet Take 1 tablet (20 mg total) by mouth at bedtime. 05/16/21  Yes Storm Frisk, MD  cloNIDine (CATAPRES) 0.2 MG tablet Take 1 tablet (0.2 mg total) by mouth at bedtime. 05/16/21  Yes Storm Frisk, MD  desvenlafaxine (PRISTIQ) 50 MG 24 hr tablet Take 75 mg by mouth daily.   Yes [provider]  doxycycline (VIBRAMYCIN) 100 MG capsule Take 1 capsule (100 mg total) by mouth 2 (two) times daily for 7 days. 08/01/21 08/08/21  Yes Marcello Fennel, PA-C  gabapentin (NEURONTIN) 600 MG tablet Take 2 tablets (1,200 mg total) by mouth 2 (two) times daily. 05/09/21  Yes Eulis Canner E, NP  mirtazapine (REMERON) 30 MG tablet Take 1 tablet (30 mg total) by mouth at bedtime. 05/09/21  Yes Eulis Canner E, NP  oxyCODONE-acetaminophen (PERCOCET/ROXICET) 5-325 MG tablet Take 1 tablet by mouth every 8 (eight) hours as needed for up to 3 days for severe pain. 08/01/21 08/04/21 Yes Marcello Fennel, PA-C  propranolol (INDERAL) 10 MG tablet Take 1 tablet (10 mg total)  by mouth every morning. 05/16/21  Yes Elsie Stain, MD  OLANZapine (ZYPREXA) 2.5 MG tablet Take 1 tablet (2.5 mg total) by mouth at bedtime as needed (insomnia/sleep). Patient not taking: No sig reported 05/16/21   Elsie Stain, MD  pantoprazole (PROTONIX) 20 MG tablet Take 1 tablet (20 mg total) by mouth daily. Patient not taking: No sig reported 05/16/21   Elsie Stain, MD    Allergies    Lamictal [lamotrigine], Lamictal [lamotrigine], Tramadol, Sulfamethoxazole-trimethoprim, Erythromycin, Erythromycin base, Geodon [ziprasidone hcl], Levetiracetam, and Sulfa antibiotics  Review of Systems   Review of Systems  Constitutional:  Negative for chills and fever.  HENT:  Negative for congestion.   Respiratory:  Negative for shortness of breath.   Cardiovascular:  Negative for chest pain.  Gastrointestinal:  Negative for abdominal pain.  Genitourinary:  Negative for enuresis.  Musculoskeletal:  Negative for back pain.  Skin:  Positive for wound. Negative for rash.  Neurological:  Negative for dizziness.  Hematological:  Does not bruise/bleed easily.   Physical Exam Updated Vital Signs BP (!) 150/108   Pulse 96   Temp 98.7 F (37.1 C) (Oral)   Resp 16   Ht 5\' 3"  (1.6 m)   Wt 70.3 kg   SpO2 99%   BMI 27.46 kg/m   Physical Exam Vitals and nursing note reviewed. Exam conducted with a chaperone present.  Constitutional:      General: She is not in acute distress.    Appearance: She is not ill-appearing.  HENT:     Head: Normocephalic and atraumatic.     Nose: No congestion.  Eyes:     Conjunctiva/sclera: Conjunctivae normal.  Cardiovascular:     Rate and Rhythm: Normal rate and regular rhythm.     Pulses: Normal pulses.  Pulmonary:     Effort: Pulmonary effort is normal.  Genitourinary:    Comments: With chaperone present breast exam was performed there is a noted small abscess underneath the right breast, measuring about 2 cm in diameter, there is apparent  discharge coming from the area, surrounding erythema, it was warm to the touch, tender to palpation, slight fluctuance, with induration present. Skin:    General: Skin is warm and dry.  Neurological:     Mental Status: She is alert.  Psychiatric:        Mood and Affect: Mood normal.    ED Results / Procedures / Treatments   Labs (all labs ordered are listed, but only abnormal results are displayed) Labs Reviewed  COMPREHENSIVE METABOLIC PANEL - Abnormal; Notable for the following components:      Result Value   CO2 21 (*)    All other components within normal limits  CBC WITH DIFFERENTIAL/PLATELET - Abnormal; Notable for the following components:   Lymphs Abs 4.1 (*)    All other components within normal limits    EKG None  Radiology No results found.  Procedures  Procedures   Medications Ordered in ED Medications  doxycycline (VIBRA-TABS) tablet 100 mg (100 mg Oral Given 08/01/21 2250)  oxyCODONE-acetaminophen (PERCOCET/ROXICET) 5-325 MG per tablet 1 tablet (1 tablet Oral Given 08/01/21 2251)    ED Course  I have reviewed the triage vital signs and the nursing notes.  Pertinent labs & imaging results that were available during my care of the patient were reviewed by me and considered in my medical decision making (see chart for details).    MDM Rules/Calculators/A&P                          Initial impression-presents with a wound over her right breast.  She is alert, does not appear in distress, vital signs are reassuring.  Triage obtain basic lab work-up.  Will provide patient with pain medications, and start her on antibiotics.  Work-up-CBC is unremarkable, CMP shows slight decrease in CO2.  Rule out-low suspicion for systemic infection as patient is nontoxic-appearing, vital signs are reassuring, no leukocytosis noted on CBC.  Low suspicion for anaphylactic shock as there is no systemic rash, vital signs are reassuring, no tongue, throat, lip swelling difficulty  breathing.  I have low suspicion for TEN and or Remo Lipps Johnson's as presentation atypical etiology.  I have low suspicion area would needs I and D as it is already actively draining.   Plan-  Wound-likely patient suffering from probable MRSA infection as she has pustules noted around the wound.  It also has purulent  drainage.  will start her on doxycycline, provide with pain medication, have her follow-up with OB/GYN for further evaluation.  Gave her strict return precautions.  Vital signs have remained stable, no indication for hospital admission.  Patient given at home care as well strict return precautions.  Patient verbalized that they understood agreed to said plan.  Final Clinical Impression(s) / ED Diagnoses Final diagnoses:  Abscess    Rx / DC Orders ED Discharge Orders          Ordered    doxycycline (VIBRAMYCIN) 100 MG capsule  2 times daily        08/01/21 2308    oxyCODONE-acetaminophen (PERCOCET/ROXICET) 5-325 MG tablet  Every 8 hours PRN        08/01/21 2308             Marcello Fennel, PA-C 08/01/21 2309    Ezequiel Essex, MD 08/02/21 0149

## 2021-08-02 ENCOUNTER — Telehealth (HOSPITAL_COMMUNITY): Payer: Self-pay | Admitting: Emergency Medicine

## 2021-08-02 MED ORDER — OXYCODONE-ACETAMINOPHEN 7.5-325 MG PO TABS: 1.0000 | ORAL_TABLET | Freq: Three times a day (TID) | ORAL | 0 refills | Status: AC | PRN

## 2021-08-02 NOTE — Telephone Encounter (Signed)
Patient called to advise the prescribed 5\325 Percocet is out of stock at [place] where it was initially prescribed.  I did call [facility] in [place] to confirm this.  [facility] pharmacy does confirm that [phone] is on backorder for many locations with an active shortage.  Prescription changed to 7.5\325 oxycodone acetaminophen to [facility] on [address].  PMR was reviewed, no frequent or recurrent recent narcotic prescriptions.

## 2021-08-04 ENCOUNTER — Inpatient Hospital Stay: Admission: RE | Admit: 2021-08-04 | Payer: Self-pay | Source: Ambulatory Visit

## 2021-08-04 IMAGING — DX DG ABD PORTABLE 1V
1 series · 1 of 1 positions shown · non-contrast
Comparison: None.

CLINICAL DATA: Check gastric catheter placement

EXAM:
PORTABLE ABDOMEN - 1 VIEW

[abdomen kub]
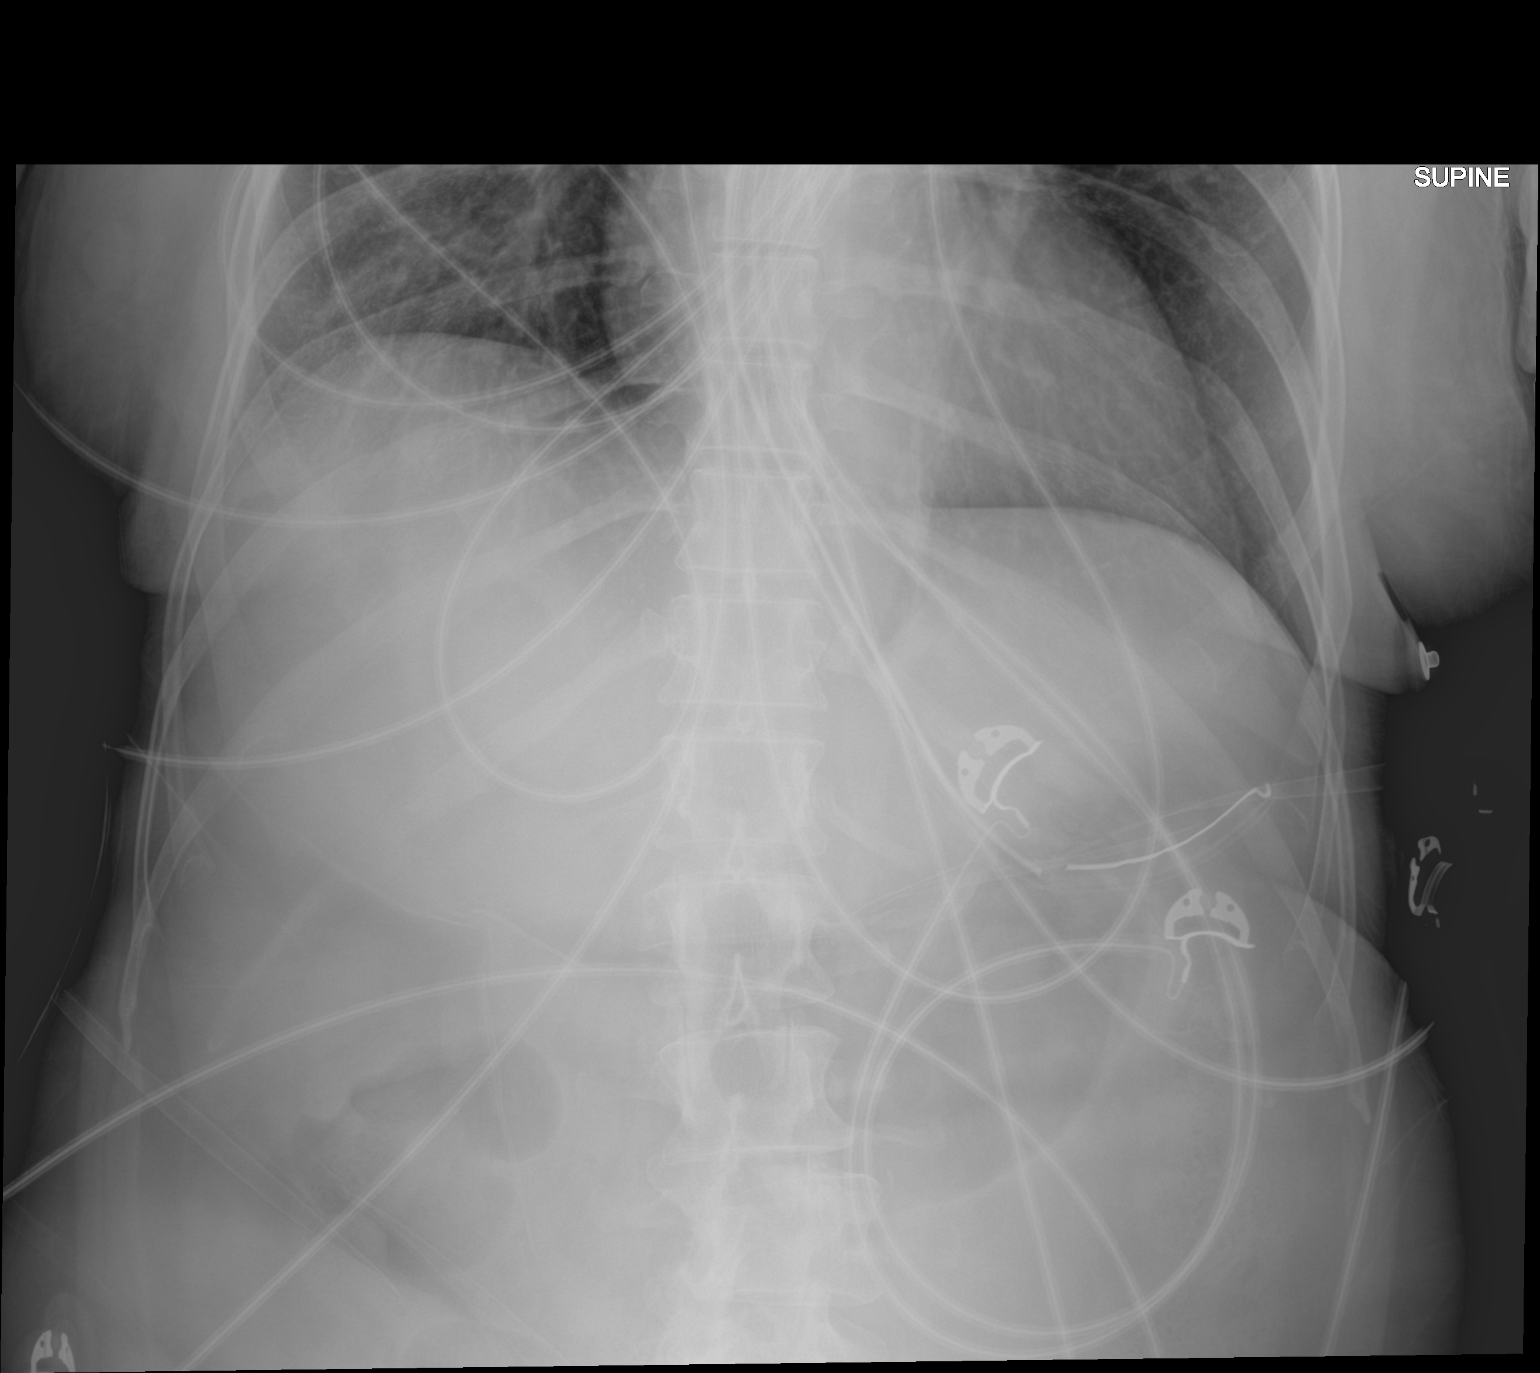

[1 of 1 positions shown; findings below may reference images not displayed]

FINDINGS: Gastric catheter is noted within the stomach. Scattered large and
small bowel gas is noted. No free air is seen.
IMPRESSION: Gastric catheter within the stomach.

## 2021-08-05 ENCOUNTER — Emergency Department (HOSPITAL_BASED_OUTPATIENT_CLINIC_OR_DEPARTMENT_OTHER)
Admission: EM | Admit: 2021-08-05 | Discharge: 2021-08-05 | Disposition: A | Discharge status: Home / Self Care | Payer: Self-pay | Attending: Emergency Medicine | Admitting: Emergency Medicine

## 2021-08-05 ENCOUNTER — Encounter (HOSPITAL_BASED_OUTPATIENT_CLINIC_OR_DEPARTMENT_OTHER): Payer: Self-pay

## 2021-08-05 ENCOUNTER — Other Ambulatory Visit: Payer: Self-pay

## 2021-08-05 DIAGNOSIS — R0789 Other chest pain: Secondary | ICD-10-CM | POA: Insufficient documentation

## 2021-08-05 DIAGNOSIS — Z79899 Other long term (current) drug therapy: Secondary | ICD-10-CM | POA: Insufficient documentation

## 2021-08-05 DIAGNOSIS — Z87891 Personal history of nicotine dependence: Secondary | ICD-10-CM | POA: Insufficient documentation

## 2021-08-05 DIAGNOSIS — I10 Essential (primary) hypertension: Secondary | ICD-10-CM | POA: Insufficient documentation

## 2021-08-05 DIAGNOSIS — Z4801 Encounter for change or removal of surgical wound dressing: Secondary | ICD-10-CM | POA: Insufficient documentation

## 2021-08-05 DIAGNOSIS — L089 Local infection of the skin and subcutaneous tissue, unspecified: Secondary | ICD-10-CM | POA: Insufficient documentation

## 2021-08-05 MED ORDER — ONDANSETRON HCL 4 MG PO TABS: 4.0000 mg | ORAL_TABLET | Freq: Four times a day (QID) | ORAL | 0 refills | Status: DC

## 2021-08-05 MED ORDER — IBUPROFEN 600 MG PO TABS: 600.0000 mg | ORAL_TABLET | Freq: Four times a day (QID) | ORAL | 0 refills | Status: DC | PRN

## 2021-08-05 MED ORDER — CEPHALEXIN 500 MG PO CAPS: 500.0000 mg | ORAL_CAPSULE | Freq: Three times a day (TID) | ORAL | 0 refills | Status: DC

## 2021-08-05 NOTE — ED Triage Notes (Signed)
Patient here POV from Home for Wound Assessment.  Patient states she was seen on Monday at [facility] for a Wound she had under her Right Breast. She states it was drained and she was prescribed Doxycycline. Patient states it was becoming better but that it is now spreading to rest of [place].  Fever of 101 at Home on Tuesday. NAD Noted during Triage. A&Ox4. GCS 15. Ambulatory.

## 2021-08-05 NOTE — ED Provider Notes (Signed)
[facility] EMERGENCY DEPT Provider Note   CSN: [id number] Arrival date & time: 08/05/21  1952     History Chief Complaint  Patient presents with   Wound Check    Clerance Lav Khilyn Zajicek is a 50 y.o. female.  The history is provided by the patient and medical records. No language interpreter was used.  Wound Check   50 year old female significant history of bipolar, polysubstance use, who presents requesting for a wound recheck.  Patient report 4 days ago she developed a pimple that started to ooze out some purulent discharge.  She was seen in the ED and was diagnosed with having an abscess and likely MRSA.  She was prescribed doxycycline.  She has been taking the medication as prescribed however when she remove the dressing today she noticed multiple redness area across her chest that she voiced concern for spreading infection.  Area is moderate in intensity, sharp, burning, uncomfortable without any itchiness no fever or shortness of breath.  She is up-to-date with tetanus.  She does endorse some bouts of nausea.  She endorsed objective fever 101 at home a few days ago.  Past Medical History:  Diagnosis Date   Alcohol withdrawal seizure with complication, with unspecified complication ([place]) 0000000   Anxiety    Bipolar 1 disorder ([place])    Depression    Intentional drug overdose ([place]) 08/04/2020   Major depressive disorder, recurrent episode with anxious distress ([place]) 08/11/2020   Morgellons syndrome    Suicide attempt ([place])    Suicide by drug overdose ([place]) 08/11/2020   Tricuspid valve regurgitation 12/18/2020    Patient Active Problem List   Diagnosis Date Noted   Essential hypertension 03/16/2021   Mixed hyperlipidemia 03/16/2021   Tricuspid valve regurgitation 12/18/2020   AKI (acute kidney injury) ([place]) 12/17/2020   Grief 07/30/2020   PTSD (post-traumatic stress disorder) 07/30/2020   Major depressive disorder, recurrent severe without psychotic  features ([place]) 06/04/2018    Past Surgical History:  Procedure Laterality Date   APPENDECTOMY       OB History   No obstetric history on file.     Family History  Problem Relation Age of Onset   Heart attack Father 37   Stroke Father    Severe combined immunodeficiency Sister    Breast cancer Paternal Aunt    Ulcerative colitis Neg Hx    Esophageal cancer Neg Hx     Social History   Tobacco Use   Smoking status: Former    Packs/day: 0.50    Types: Cigarettes   Smokeless tobacco: Never  Vaping Use   Vaping Use: Never used  Substance Use Topics   Alcohol use: Yes    Comment: occas   Drug use: Not Currently    Comment: not currently-was +cocaine and amphetamines, clean x3 years    Home Medications Prior to Admission medications   Medication Sig Start Date End Date Taking? Authorizing Provider  atorvastatin (LIPITOR) 20 MG tablet Take 1 tablet (20 mg total) by mouth at bedtime. 05/16/21   Elsie Stain, MD  cloNIDine (CATAPRES) 0.2 MG tablet Take 1 tablet (0.2 mg total) by mouth at bedtime. 05/16/21   Elsie Stain, MD  desvenlafaxine (PRISTIQ) 50 MG 24 hr tablet Take 75 mg by mouth daily.    [provider]  doxycycline (VIBRAMYCIN) 100 MG capsule Take 1 capsule (100 mg total) by mouth 2 (two) times daily for 7 days. 08/01/21 08/08/21  Marcello Fennel, PA-C  gabapentin (NEURONTIN) 600  MG tablet Take 2 tablets (1,200 mg total) by mouth 2 (two) times daily. 05/09/21   Salley Slaughter, NP  mirtazapine (REMERON) 30 MG tablet Take 1 tablet (30 mg total) by mouth at bedtime. 05/09/21   Salley Slaughter, NP  OLANZapine (ZYPREXA) 2.5 MG tablet Take 1 tablet (2.5 mg total) by mouth at bedtime as needed (insomnia/sleep). Patient not taking: No sig reported 05/16/21   Elsie Stain, MD  oxyCODONE-acetaminophen (PERCOCET) 7.5-325 MG tablet Take 1 tablet by mouth every 8 (eight) hours as needed for up to 3 days for severe pain. 08/02/21 08/05/21  Charlesetta Shanks, MD  pantoprazole (PROTONIX) 20 MG tablet Take 1 tablet (20 mg total) by mouth daily. Patient not taking: No sig reported 05/16/21   Elsie Stain, MD  propranolol (INDERAL) 10 MG tablet Take 1 tablet (10 mg total) by mouth every morning. 05/16/21   Elsie Stain, MD    Allergies    Lamictal [lamotrigine], Lamictal [lamotrigine], Tramadol, Sulfamethoxazole-trimethoprim, Erythromycin, Erythromycin base, Geodon [ziprasidone hcl], Levetiracetam, and Sulfa antibiotics  Review of Systems   Review of Systems  Constitutional:  Negative for fever.  Skin:  Positive for rash and wound.   Physical Exam Updated Vital Signs BP (!) 168/131 (BP Location: Right Arm)   Pulse 83   Temp 98.3 F (36.8 C) (Oral)   Resp (!) 24   Ht 5\' 3"  (1.6 m)   Wt 70.3 kg   SpO2 100%   BMI 27.45 kg/m   Physical Exam Vitals and nursing note reviewed.  Constitutional:      General: She is not in acute distress.    Appearance: She is well-developed.  HENT:     Head: Atraumatic.  Eyes:     Conjunctiva/sclera: Conjunctivae normal.  Pulmonary:     Effort: Pulmonary effort is normal.  Chest:     Chest wall: Tenderness (Chest wall: There are scattered patches of skin erythema and irritation noted across the chest involving both breast fold without obvious abscess.  No vesicular lesion) present.  Musculoskeletal:     Cervical back: Neck supple.  Skin:    Findings: No rash.  Neurological:     Mental Status: She is alert.  Psychiatric:        Mood and Affect: Mood normal.    ED Results / Procedures / Treatments   Labs (all labs ordered are listed, but only abnormal results are displayed) Labs Reviewed - No data to display  EKG None  Radiology No results found.  Procedures Procedures   Medications Ordered in ED Medications - No data to display  ED Course  I have reviewed the triage vital signs and the nursing notes.  Pertinent labs & imaging results that were available during my  care of the patient were reviewed by me and considered in my medical decision making (see chart for details).    MDM Rules/Calculators/A&P                           BP (!) 168/131 (BP Location: Right Arm)   Pulse 83   Temp 98.3 F (36.8 C) (Oral)   Resp (!) 24   Ht 5\' 3"  (1.6 m)   Wt 70.3 kg   SpO2 100%   BMI 27.45 kg/m   Final Clinical Impression(s) / ED Diagnoses Final diagnoses:  Skin infection    Rx / DC Orders ED Discharge Orders  Ordered    cephALEXin (KEFLEX) 500 MG capsule  3 times daily        08/05/21 2145    ibuprofen (ADVIL) 600 MG tablet  Every 6 hours PRN        08/05/21 2146           9:41 PM Patient had a small subcutaneous abscess to her mid chest near the breast fold that was oozing purulent discharge a few days prior.  She was prescribed doxycycline.  She is here with concerns of spreading infection as there increased patches area of erythema and ulceration noted across her chest.  Skin changes appears similar to localized skin irritation possibly from her dressing from the tape.  There is no evidence of abscess, no vesicular lesion, no petechial lesion.  Due to her concern, will add keflex and pt can also continue with doxycycline.  Low suspicion for drug allergies.  Recommend NSAIDs as needed for pain.  Return precaution given   Otho Perl 08/05/21 2149    Pollyann Savoy, MD 08/05/21 2233

## 2021-08-05 NOTE — Discharge Instructions (Addendum)
You may continue to take doxycycline, and also take Keflex prescribed.  Avoid any adhesive tape that can potentially irritates your skin.  Take ibuprofen as needed for pain.  Apply warm moist compress to affected area several times a day to aid with healing.

## 2021-08-05 NOTE — ED Notes (Signed)
This RN presented the AVS utilizing Teachback Method. Patient verbalizes understanding of Discharge Instructions. Opportunity for Questioning and Answers were provided. Patient Discharged from ED ambulatory to Home via Self.   

## 2021-08-08 ENCOUNTER — Ambulatory Visit: Payer: Self-pay | Admitting: Cardiovascular Disease

## 2021-08-09 ENCOUNTER — Telehealth (INDEPENDENT_AMBULATORY_CARE_PROVIDER_SITE_OTHER): Payer: No Payment, Other | Admitting: Psychiatry

## 2021-08-09 ENCOUNTER — Encounter (HOSPITAL_COMMUNITY): Payer: Self-pay | Admitting: Psychiatry

## 2021-08-09 DIAGNOSIS — F3341 Major depressive disorder, recurrent, in partial remission: Secondary | ICD-10-CM

## 2021-08-09 DIAGNOSIS — F4310 Post-traumatic stress disorder, unspecified: Secondary | ICD-10-CM

## 2021-08-09 MED ORDER — DESVENLAFAXINE SUCCINATE ER 25 MG PO TB24: 75.0000 mg | ORAL_TABLET | Freq: Every day | ORAL | 3 refills | Status: DC

## 2021-08-09 MED ORDER — GABAPENTIN 600 MG PO TABS: 1200.0000 mg | ORAL_TABLET | Freq: Two times a day (BID) | ORAL | 3 refills | Status: DC

## 2021-08-09 MED ORDER — OLANZAPINE 2.5 MG PO TABS: 2.5000 mg | ORAL_TABLET | Freq: Every evening | ORAL | 3 refills | Status: DC | PRN

## 2021-08-09 MED ORDER — MIRTAZAPINE 30 MG PO TABS: 30.0000 mg | ORAL_TABLET | Freq: Every day | ORAL | 3 refills | Status: DC

## 2021-08-09 NOTE — Progress Notes (Signed)
BH MD/PA/NP OP Progress Note Virtual Visit via Video Note  I connected with Margaret Mccann Jed Limerick on 08/09/21 at  1:30 PM EST by a video enabled telemedicine application and verified that I am speaking with the correct person using two identifiers.  Location: Patient: Home Provider: Clinic   I discussed the limitations of evaluation and management by telemedicine and the availability of in person appointments. The patient expressed understanding and agreed to proceed.  I provided 30 minutes of non-face-to-face time during this encounter.   08/09/2021 2:03 PM Margaret Mccann Shadae Sensing  MRN:  [id number]  Chief Complaint: "I am doing well mentally but I have MRSA"    HPI: 50 year old female seen today for follow-up psychiatric evaluation.   She has a psychiatric history of depression, anxiety, PTSD, and substance use (cocaine, amphetamine use, stimulants, and alcohol).  She is managed on gabapentin 600 mg twice daily, Remeron 30 mg nightly, Pristiq 75 mg daily, Zyprexa 2.5 mg nightly (takes as needed), and hydroxyzine 25 mg 3 times daily. She is also prescribed clonidine and propanolol by her PCP. She notes her medications are effective in managing her psychiatric conditions.  Today she is well-groomed, pleasant, cooperative, engaged in conversation, maintained eye contact.  She informed provider that mentally she feels stable however notes that she now has MRSA.  She informed Probation officer that she is on 2 antibiotics and reports that she is getting better.  She also informed Probation officer that she is now on propanolol and clonidine notes that her blood pressure is under control.  Patient notes that she has minimal anxiety and depression.  Provider conducted a GAD-7 and patient scored a 2, at her last visit she scored 8.  Provider also conducted PHQ-9 and patient scored a 1, at her last visit she scored a 7.  She endorses adequate sleep and appetite.  Today she denies SI/HI/VAH, mania, paranoia.     Patient notes that she continues to work at [employer] and reports that things are going well.  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    No medication changes made today.  Patient agreeable to continue medication as prescribed.  No other concerns noted at this time.     Visit Diagnosis:    ICD-10-CM   1. Major depressive disorder, recurrent episode, in partial remission with anxious distress (HCC)  F33.41 gabapentin (NEURONTIN) 600 MG tablet    mirtazapine (REMERON) 30 MG tablet    OLANZapine (ZYPREXA) 2.5 MG tablet    2. PTSD (post-traumatic stress disorder)  F43.10 mirtazapine (REMERON) 30 MG tablet      Past Psychiatric History:  MDD , anxiety, PTSD, Substance abuse (cocaine, alcohol)  Past Medical History:  Past Medical History:  Diagnosis Date   Alcohol withdrawal seizure with complication, with unspecified complication ([place]) 0000000   Anxiety    Bipolar 1 disorder ([place])    Depression    Intentional drug overdose ([place]) 08/04/2020   Major depressive disorder, recurrent episode with anxious distress ([place]) 08/11/2020   Morgellons syndrome    Suicide attempt ([place])    Suicide by drug overdose ([place]) 08/11/2020   Tricuspid valve regurgitation 12/18/2020    Past Surgical History:  Procedure Laterality Date   APPENDECTOMY      Family Psychiatric History: Sister - committed suicide in 2005 by overdosing.  Family History:  Family History  Problem Relation Age of Onset   Heart attack Father 68   Stroke Father    Severe  combined immunodeficiency Sister    Breast cancer Paternal Aunt    Ulcerative colitis Neg Hx    Esophageal cancer Neg Hx     Social History:  Social History   Socioeconomic History   Marital status: Single    Spouse name: Not on file   Number of children: Not on file   Years of education: Not on file   Highest education level: Not on file  Occupational History   Not on file  Tobacco Use   Smoking  status: Former    Packs/day: 0.50    Types: Cigarettes   Smokeless tobacco: Never  Vaping Use   Vaping Use: Never used  Substance and Sexual Activity   Alcohol use: Yes    Comment: occas   Drug use: Not Currently    Comment: not currently-was +cocaine and amphetamines, clean x3 years   Sexual activity: Yes    Birth control/protection: Pill  Other Topics Concern   Not on file  Social History Narrative   ** Merged History Encounter **       Social Determinants of Health   Financial Resource Strain: Not on file  Food Insecurity: Not on file  Transportation Needs: Not on file  Physical Activity: Not on file  Stress: Not on file  Social Connections: Not on file    Allergies:  Allergies  Allergen Reactions   Lamictal [Lamotrigine] Anaphylaxis, Rash and Other (See Comments)    Stevens-Johnson syndrome and fevers, also   Lamictal [Lamotrigine] Anaphylaxis, Rash and Other (See Comments)    Fevers and Stevens-Johnson syndrome, also   Tramadol Other (See Comments)    Pt had a seizure after taking Tramadol!!   Sulfamethoxazole-Trimethoprim Rash   Erythromycin Nausea And Vomiting   Erythromycin Base Nausea And Vomiting   Geodon [Ziprasidone Hcl] Rash   Levetiracetam Anxiety   Sulfa Antibiotics Rash    Metabolic Disorder Labs: Lab Results  Component Value Date   HGBA1C 5.6 12/19/2020   MPG 114.02 12/19/2020   No results found for: PROLACTIN Lab Results  Component Value Date   CHOL 183 05/16/2021   TRIG 118 05/16/2021   HDL 94 05/16/2021   CHOLHDL 1.9 05/16/2021   LDLCALC 69 05/16/2021   Lab Results  Component Value Date   TSH 1.300 05/16/2021    Therapeutic Level Labs: No results found for: LITHIUM No results found for: VALPROATE No components found for:  CBMZ  Current Medications: Current Outpatient Medications  Medication Sig Dispense Refill   atorvastatin (LIPITOR) 20 MG tablet Take 1 tablet (20 mg total) by mouth at bedtime. 30 tablet 1   cephALEXin  (KEFLEX) 500 MG capsule Take 1 capsule (500 mg total) by mouth 3 (three) times daily. 20 capsule 0   cloNIDine (CATAPRES) 0.2 MG tablet Take 1 tablet (0.2 mg total) by mouth at bedtime. 60 tablet 4   desvenlafaxine (PRISTIQ) 25 MG 24 hr tablet Take 3 tablets (75 mg total) by mouth daily. 90 tablet 3   gabapentin (NEURONTIN) 600 MG tablet Take 2 tablets (1,200 mg total) by mouth 2 (two) times daily. 120 tablet 3   ibuprofen (ADVIL) 600 MG tablet Take 1 tablet (600 mg total) by mouth every 6 (six) hours as needed. 30 tablet 0   mirtazapine (REMERON) 30 MG tablet Take 1 tablet (30 mg total) by mouth at bedtime. 30 tablet 3   OLANZapine (ZYPREXA) 2.5 MG tablet Take 1 tablet (2.5 mg total) by mouth at bedtime as needed (insomnia/sleep). 30 tablet 3  ondansetron (ZOFRAN) 4 MG tablet Take 1 tablet (4 mg total) by mouth every 6 (six) hours. 12 tablet 0   pantoprazole (PROTONIX) 20 MG tablet Take 1 tablet (20 mg total) by mouth daily. (Patient not taking: No sig reported) 60 tablet 4   propranolol (INDERAL) 10 MG tablet Take 1 tablet (10 mg total) by mouth every morning. 30 tablet 4   No current facility-administered medications for this visit.     Musculoskeletal: Strength & Muscle Tone: within normal limits Gait & Station: normal Patient leans: N/A  Psychiatric Specialty Exam: Review of Systems  There were no vitals taken for this visit.There is no height or weight on file to calculate BMI.  General Appearance: Well Groomed  Eye Contact:  Good  Speech:  Clear and Coherent and Normal Rate  Volume:  Normal  Mood:  Euthymic  Affect:  Appropriate and Congruent  Thought Process:  Coherent, Goal Directed, and Linear  Orientation:  Full (Time, Place, and Person)  Thought Content: WDL and Logical   Suicidal Thoughts:  No  Homicidal Thoughts:  No  Memory:  Immediate;   Good Recent;   Good Remote;   Good  Judgement:  Good  Insight:  Good  Psychomotor Activity:  Normal  Concentration:   Concentration: Good and Attention Span: Good  Recall:  Good  Fund of Knowledge: Good  Language: Good  Akathisia:  No  Handed:  Right  AIMS (if indicated): not done  Assets:  Communication Skills Desire for Improvement Financial Resources/Insurance Housing  ADL's:  Intact  Cognition: WNL  Sleep:  Good   Screenings: AIMS    Flowsheet Row [facility]  AIMS Total Score 0      AUDIT    Flowsheet Row [facility]  Alcohol Use Disorder Identification Test Final Score (AUDIT) 11 0      GAD-7    Flowsheet Row Video Visit from 08/09/2021 in [facility] Office Visit from 05/16/2021 in [facility] Clinical Support from 05/09/2021 in [facility] Office Visit from 03/16/2021 in [facility]  Total GAD-7 Score 2 4 8 2       PHQ2-9    Flowsheet Row Video Visit from 08/09/2021 in [facility] Office Visit from 05/16/2021 in [facility] Clinical Support from 05/09/2021 in [facility] Office Visit from 03/16/2021 in [facility]  PHQ-2 Total Score 0 0 1 0  PHQ-9 Total Score 1 -- 7 3      Flowsheet Row Video Visit from 08/09/2021 in [facility] from 08/05/2021 in [facility] ED from [facility]  C-SSRS RISK CATEGORY No Risk No Risk No Risk        Assessment and Plan: Patient notes that she is doing well on her current medication regimen. No medication changes made today.  Patient agreeable to continue medication as prescribed.    1. Major depressive disorder, recurrent episode, in partial remission with anxious distress (HCC)  Continue- gabapentin (NEURONTIN) 600 MG tablet; Take 2 tablets (1,200 mg total) by mouth 2 (two) times daily.  Dispense: 120 tablet; Refill: 3 Continue- mirtazapine (REMERON) 30 MG tablet; Take 1 tablet (30 mg  total) by mouth at bedtime.  Dispense: 30 tablet; Refill: 3 Continue- desvenlafaxine (PRISTIQ) 50 MG 24 hr tablet; Take one and a half tablets daily (75 mg)  Dispense: 45 tablet; Refill: 3 Continue- OLANZapine (ZYPREXA) 2.5 MG tablet; Take 1 tablet (2.5 mg total) by mouth at bedtime as needed (insomnia/sleep).  Dispense: 15 tablet; Refill: 3  2. PTSD (post-traumatic stress disorder)  Continue- hydrOXYzine (ATARAX/VISTARIL) 25 MG tablet; Take 2 tablets twice daily as needed for anxiety and take 2 tablets at bedtime as needed for sleep  Dispense: 120 tablet; Refill: 3 Continue- mirtazapine (REMERON) 30 MG tablet; Take 1 tablet (30 mg total) by mouth at bedtime.  Dispense: 30 tablet; Refill: 3 Continue- desvenlafaxine (PRISTIQ) 50 MG 24 hr tablet; Take one and a half tablets daily (75 mg)  Dispense: 45 tablet; Refill: 3      Salley Slaughter, NP 08/09/2021, 2:03 PM

## 2021-08-20 ENCOUNTER — Other Ambulatory Visit: Payer: Self-pay | Admitting: Critical Care Medicine

## 2021-08-20 DIAGNOSIS — E782 Mixed hyperlipidemia: Secondary | ICD-10-CM

## 2021-08-21 NOTE — Telephone Encounter (Signed)
Requested Prescriptions  Pending Prescriptions Disp Refills  . atorvastatin (LIPITOR) 20 MG tablet [Pharmacy Med Name: Atorvastatin Calcium 20 MG Oral Tablet] 30 tablet 0    Sig: TAKE 1 TABLET BY MOUTH AT BEDTIME     Cardiovascular:  Antilipid - Statins Passed - 08/20/2021 10:29 AM      Passed - Total Cholesterol in normal range and within 360 days    Cholesterol, Total  Date Value Ref Range Status  05/16/2021 183 100 - 199 mg/dL Final         Passed - LDL in normal range and within 360 days    LDL Chol Calc (NIH)  Date Value Ref Range Status  05/16/2021 69 0 - 99 mg/dL Final         Passed - HDL in normal range and within 360 days    HDL  Date Value Ref Range Status  05/16/2021 94 >39 mg/dL Final         Passed - Triglycerides in normal range and within 360 days    Triglycerides  Date Value Ref Range Status  05/16/2021 118 0 - 149 mg/dL Final         Passed - Patient is not pregnant      Passed - Valid encounter within last 12 months    Recent Outpatient Visits          3 months ago Essential hypertension   [facility] Storm Frisk, MD      Future Appointments            In 1 month Delford Field Charlcie Cradle, MD [facility]

## 2021-09-02 ENCOUNTER — Telehealth: Payer: Self-pay | Admitting: Critical Care Medicine

## 2021-09-02 NOTE — Telephone Encounter (Signed)
Dr. Wright on vacation 12/27. Left vm for patient to call [phone] to reschedule.  

## 2021-09-20 ENCOUNTER — Ambulatory Visit: Payer: Self-pay | Admitting: Critical Care Medicine

## 2021-09-21 ENCOUNTER — Other Ambulatory Visit: Payer: Self-pay | Admitting: Critical Care Medicine

## 2021-09-21 DIAGNOSIS — E782 Mixed hyperlipidemia: Secondary | ICD-10-CM

## 2021-09-21 DIAGNOSIS — F4310 Post-traumatic stress disorder, unspecified: Secondary | ICD-10-CM

## 2021-09-21 NOTE — Telephone Encounter (Signed)
Patient called in to say that she is completely out of her medications and need Rx sent to the pharmacy today please

## 2021-10-17 ENCOUNTER — Other Ambulatory Visit: Payer: Self-pay | Admitting: Critical Care Medicine

## 2021-10-17 DIAGNOSIS — F4310 Post-traumatic stress disorder, unspecified: Secondary | ICD-10-CM

## 2021-10-17 NOTE — Telephone Encounter (Signed)
Requested Prescriptions  Pending Prescriptions Disp Refills   propranolol (INDERAL) 10 MG tablet [Pharmacy Med Name: Propranolol HCl 10 MG Oral Tablet] 30 tablet 0    Sig: TAKE 1 TABLET BY MOUTH ONCE DAILY IN THE MORNING     Cardiovascular:  Beta Blockers Failed - 10/17/2021 11:25 AM      Failed - Last BP in normal range    BP Readings from Last 1 Encounters:  08/05/21 (!) 168/131         Passed - Last Heart Rate in normal range    Pulse Readings from Last 1 Encounters:  08/05/21 83         Passed - Valid encounter within last 6 months    Recent Outpatient Visits          5 months ago Essential hypertension   [facility] Storm Frisk, MD      Future Appointments            In 1 week Storm Frisk, MD [facility]

## 2021-10-22 NOTE — Progress Notes (Signed)
Established Patient Office Visit  Subjective:  Patient ID: Margaret Mccann, female    DOB: October 09, 1970  Age: 51 y.o. MRN: [id number]  CC: primary care follow up , loss of income/job for affording medications  HPI  04/2021   Referred for HTN mgmt and PCP to est by [facility] Margaret Mccann This patient has a history of PTSD substance use prior suicide attempts, major depression and severe anxiety.  The patient was hospitalized in March with hypoxic respiratory failure and polysubstance overdose.  Patient then went to rehab for a 19-monthinterval.  Since that time she is in a better mental health state.  Patient is referred by the mental [place] [facility] to establish.  She has not had a primary care provider in recent times.  She states that clonidine helps her PTSD and is requesting refills on this and propranolol.  Today on arrival blood pressure is 119/58 pulse 76 and she is already on the clonidine 0.2 mg daily.  She was also given a short-term refill on Zyprexa which she takes at bedtime to help with sleep as needed.  She is requesting refills on this as well.   Note during the March hospitalization the patient had an echocardiogram that showed significant tricuspid regurgitation but normal right heart pressures  Patient does have a gynecologist did have a Pap smear over 3 years ago and is now becoming due.  Patient does need hepatitis C screening.  Note patient's not actively suicidal at this time.  She does have reflux symptoms on a daily basis.  She is on over-the-counter Nexium is requesting a prescription for Protonix.  Patient is due a mammogram.  Patient's mental health stressors are that her fianc hung himself in their home last fall.  Also her sister committed suicide in October.  This resulted in significant issues for this patient.  10/24/21 Since the last visit the patient unfortunately has lost her job and cannot afford to pay for her medications.  She does not have  any insurance.  She cannot afford any of the [employer]  On arrival blood pressure 111/76 she is doing well on the clonidine twice daily.  Her PHQ-9 and GAD-7 are mildly elevated.  She is not suicidal.  She tried to go see cardiology but could not make the appointment because her boss would not let her off work for this visit.  Also she was unable to attend a mammogram appointment twice because of her job.  She also needs colon cancer screening.  Note on arrival she did receive the flu vaccine  The patient has no other complaints   Past Medical History:  Diagnosis Date   Alcohol withdrawal seizure with complication, with unspecified complication ([facility] 33/38/3291  Anxiety    Bipolar 1 disorder ([facility]    Depression    Intentional drug overdose ([facility] 08/04/2020   Major depressive disorder, recurrent episode with anxious distress ([facility] 08/11/2020   Morgellons syndrome    Suicide attempt ([facility]    Suicide by drug overdose ([facility] 08/11/2020   Tricuspid valve regurgitation 12/18/2020    Past Surgical History:  Procedure Laterality Date   APPENDECTOMY      Family History  Problem Relation Age of Onset   Heart attack Father 587  Stroke Father    Severe combined immunodeficiency Sister    Breast cancer Paternal Aunt    Ulcerative colitis Neg Hx    Esophageal cancer Neg Hx     Social  History   Socioeconomic History   Marital status: Single    Spouse name: Not on file   Number of children: Not on file   Years of education: Not on file   Highest education level: Not on file  Occupational History   Not on file  Tobacco Use   Smoking status: Former    Packs/day: 0.50    Types: Cigarettes   Smokeless tobacco: Never  Vaping Use   Vaping Use: Never used  Substance and Sexual Activity   Alcohol use: Yes    Comment: occas   Drug use: Not Currently    Comment: not currently-was +cocaine and amphetamines, clean x3 years   Sexual activity: Yes    Birth  control/protection: Pill  Other Topics Concern   Not on file  Social History Narrative   ** Merged History Encounter **       Social Determinants of Health   Financial Resource Strain: Not on file  Food Insecurity: Not on file  Transportation Needs: Not on file  Physical Activity: Not on file  Stress: Not on file  Social Connections: Not on file  Intimate Partner Violence: Not on file    Outpatient Medications Prior to Visit  Medication Sig Dispense Refill   atorvastatin (LIPITOR) 20 MG tablet TAKE 1 TABLET BY MOUTH AT BEDTIME 30 tablet 0   cloNIDine (CATAPRES) 0.2 MG tablet Take 1 tablet (0.2 mg total) by mouth at bedtime. 60 tablet 4   desvenlafaxine (PRISTIQ) 25 MG 24 hr tablet Take 3 tablets (75 mg total) by mouth daily. 90 tablet 3   gabapentin (NEURONTIN) 600 MG tablet Take 2 tablets (1,200 mg total) by mouth 2 (two) times daily. 120 tablet 3   ibuprofen (ADVIL) 600 MG tablet Take 1 tablet (600 mg total) by mouth every 6 (six) hours as needed. 30 tablet 0   mirtazapine (REMERON) 30 MG tablet Take 1 tablet (30 mg total) by mouth at bedtime. 30 tablet 3   OLANZapine (ZYPREXA) 2.5 MG tablet Take 1 tablet (2.5 mg total) by mouth at bedtime as needed (insomnia/sleep). 30 tablet 3   ondansetron (ZOFRAN) 4 MG tablet Take 1 tablet (4 mg total) by mouth every 6 (six) hours. 12 tablet 0   pantoprazole (PROTONIX) 20 MG tablet Take 1 tablet (20 mg total) by mouth daily. 60 tablet 4   propranolol (INDERAL) 10 MG tablet TAKE 1 TABLET BY MOUTH ONCE DAILY IN THE MORNING 90 tablet 0   cephALEXin (KEFLEX) 500 MG capsule Take 1 capsule (500 mg total) by mouth 3 (three) times daily. (Patient not taking: Reported on 10/24/2021) 20 capsule 0   No facility-administered medications prior to visit.    Allergies  Allergen Reactions   Lamictal [Lamotrigine] Anaphylaxis, Rash and Other (See Comments)    Stevens-Johnson syndrome and fevers, also   Lamictal [Lamotrigine] Anaphylaxis, Rash and Other  (See Comments)    Fevers and Stevens-Johnson syndrome, also   Tramadol Other (See Comments)    Pt had a seizure after taking Tramadol!!   Sulfamethoxazole-Trimethoprim Rash   Erythromycin Nausea And Vomiting   Erythromycin Base Nausea And Vomiting   Geodon [Ziprasidone Hcl] Rash   Levetiracetam Anxiety   Sulfa Antibiotics Rash    ROS Review of Systems  Constitutional:  Negative for chills, diaphoresis and fever.  HENT:  Negative for congestion, hearing loss, nosebleeds, sore throat and tinnitus.   Eyes:  Negative for photophobia and redness.  Respiratory:  Negative for cough, shortness of breath, wheezing and stridor.  Cardiovascular:  Negative for chest pain, palpitations and leg swelling.  Gastrointestinal:  Negative for abdominal pain, blood in stool, constipation, diarrhea, nausea and vomiting.  Endocrine: Negative for polydipsia.  Genitourinary:  Negative for dysuria, flank pain, frequency, hematuria and urgency.  Musculoskeletal:  Negative for back pain, myalgias and neck pain.  Skin:  Negative for rash.  Allergic/Immunologic: Negative for environmental allergies.  Neurological:  Negative for dizziness, tremors, seizures, weakness and headaches.  Hematological:  Does not bruise/bleed easily.  Psychiatric/Behavioral:  Negative for suicidal ideas. The patient is not nervous/anxious.      Objective:    Physical Exam Vitals reviewed.  Constitutional:      Appearance: Normal appearance. She is well-developed. She is obese. She is not diaphoretic.  HENT:     Head: Normocephalic and atraumatic.     Nose: No nasal deformity, septal deviation, mucosal edema or rhinorrhea.     Right Sinus: No maxillary sinus tenderness or frontal sinus tenderness.     Left Sinus: No maxillary sinus tenderness or frontal sinus tenderness.     Mouth/Throat:     Pharynx: No oropharyngeal exudate.  Eyes:     General: No scleral icterus.    Conjunctiva/sclera: Conjunctivae normal.     Pupils:  Pupils are equal, round, and reactive to light.  Neck:     Thyroid: No thyromegaly.     Vascular: No carotid bruit or JVD.     Trachea: Trachea normal. No tracheal tenderness or tracheal deviation.  Cardiovascular:     Rate and Rhythm: Normal rate and regular rhythm.     Chest Wall: PMI is not displaced.     Pulses: Normal pulses. No decreased pulses.     Heart sounds: Normal heart sounds, S1 normal and S2 normal. Heart sounds not distant. No murmur heard. No systolic murmur is present.  No diastolic murmur is present.    No friction rub. No gallop. No S3 or S4 sounds.  Pulmonary:     Effort: No tachypnea, accessory muscle usage or respiratory distress.     Breath sounds: No stridor. No decreased breath sounds, wheezing, rhonchi or rales.  Chest:     Chest wall: No tenderness.  Abdominal:     General: Bowel sounds are normal. There is no distension.     Palpations: Abdomen is soft. Abdomen is not rigid.     Tenderness: There is no abdominal tenderness. There is no guarding or rebound.  Musculoskeletal:        General: Normal range of motion.     Cervical back: Normal range of motion and neck supple. No edema, erythema or rigidity. No muscular tenderness. Normal range of motion.  Lymphadenopathy:     Head:     Right side of head: No submental or submandibular adenopathy.     Left side of head: No submental or submandibular adenopathy.     Cervical: No cervical adenopathy.  Skin:    General: Skin is warm and dry.     Coloration: Skin is not pale.     Findings: No rash.     Nails: There is no clubbing.  Neurological:     General: No focal deficit present.     Mental Status: She is alert and oriented to person, place, and time. Mental status is at baseline.     Sensory: No sensory deficit.  Psychiatric:        Mood and Affect: Mood normal.        Speech: Speech normal.  Behavior: Behavior normal.        Thought Content: Thought content normal.    BP 111/76    Pulse 82     Resp 16    Wt 175 lb 6.4 oz (79.6 kg)    SpO2 97%    BMI 31.07 kg/m  Wt Readings from Last 3 Encounters:  10/24/21 175 lb 6.4 oz (79.6 kg)  08/05/21 154 lb 15.7 oz (70.3 kg)  08/01/21 155 lb (70.3 kg)     Health Maintenance Due  Topic Date Due   COLONOSCOPY (Pts 45-20yr Insurance coverage will need to be confirmed)  Never done   COVID-19 Vaccine (2 - Booster for [employer]) 09/08/2020    There are no preventive care reminders to display for this patient.  Lab Results  Component Value Date   TSH 1.300 05/16/2021   Lab Results  Component Value Date   WBC 9.0 08/01/2021   HGB 13.5 08/01/2021   HCT 40.1 08/01/2021   MCV 91.1 08/01/2021   PLT 348 08/01/2021   Lab Results  Component Value Date   NA 136 08/01/2021   K 4.2 08/01/2021   CO2 21 (L) 08/01/2021   GLUCOSE 83 08/01/2021   BUN 20 08/01/2021   CREATININE 0.64 08/01/2021   BILITOT 0.4 08/01/2021   ALKPHOS 88 08/01/2021   AST 24 08/01/2021   ALT 33 08/01/2021   PROT 8.1 08/01/2021   ALBUMIN 4.3 08/01/2021   CALCIUM 9.4 08/01/2021   ANIONGAP 11 08/01/2021   EGFR 94 05/16/2021   GFR 75.12 05/25/2017   Lab Results  Component Value Date   CHOL 183 05/16/2021   Lab Results  Component Value Date   HDL 94 05/16/2021   Lab Results  Component Value Date   LDLCALC 69 05/16/2021   Lab Results  Component Value Date   TRIG 118 05/16/2021   Lab Results  Component Value Date   CHOLHDL 1.9 05/16/2021   Lab Results  Component Value Date   HGBA1C 5.6 12/19/2020      Assessment & Plan:   Problem List Items Addressed This Visit       Cardiovascular and Mediastinum   Tricuspid valve regurgitation    History of tricuspid regurgitation was seen in March 2022 echocardiogram I referred her to cardiology she missed her last appointment we will make another referral      Relevant Medications   atorvastatin (LIPITOR) 20 MG tablet   cloNIDine (CATAPRES) 0.2 MG tablet   propranolol (INDERAL) 10 MG tablet    Other Relevant Orders   Ambulatory referral to Cardiology   Essential hypertension    Hypertension well controlled on clonidine refills were given      Relevant Medications   atorvastatin (LIPITOR) 20 MG tablet   cloNIDine (CATAPRES) 0.2 MG tablet   propranolol (INDERAL) 10 MG tablet     Genitourinary   RESOLVED: AKI (acute kidney injury) ([facility] [place]    Based on last lab this is resolved        Other   Major depressive disorder, recurrent severe without psychotic features ([facility]    Symptom complex improved on current program she will need refills on her medications [facility] as she has no insurance or money to pay for medicines  Patient to pick up [place] patient assistance application for blue card      Relevant Medications   desvenlafaxine (PRISTIQ) 25 MG 24 hr tablet   mirtazapine (REMERON)  30 MG tablet   PTSD (post-traumatic stress disorder)    Plan refill of propranolol for her PTSD syndrome      Relevant Medications   desvenlafaxine (PRISTIQ) 25 MG 24 hr tablet   mirtazapine (REMERON) 30 MG tablet   propranolol (INDERAL) 10 MG tablet   Mixed hyperlipidemia    Plan to refill patient's statin therapy      Relevant Medications   atorvastatin (LIPITOR) 20 MG tablet   cloNIDine (CATAPRES) 0.2 MG tablet   propranolol (INDERAL) 10 MG tablet   Other Visit Diagnoses     Colon cancer screening    -  Primary   Relevant Orders   Fecal occult blood, imunochemical   Encounter for screening mammogram for malignant neoplasm of breast       Relevant Orders   MM Digital Diagnostic Bilat   Major depressive disorder, recurrent episode, in partial remission with anxious distress (HCC)       Relevant Medications   desvenlafaxine (PRISTIQ) 25 MG 24 hr tablet   gabapentin (NEURONTIN) 600 MG tablet   mirtazapine (REMERON) 30 MG tablet   OLANZapine (ZYPREXA) 2.5 MG tablet   Need for immunization against  influenza       Relevant Orders   Flu Vaccine QUAD 32moIM (Fluarix, Fluzone & Alfiuria Quad PF) (Completed)       Meds ordered this encounter  Medications   atorvastatin (LIPITOR) 20 MG tablet    Sig: Take 1 tablet (20 mg total) by mouth at bedtime.    Dispense:  60 tablet    Refill:  0    [employer]   cloNIDine (CATAPRES) 0.2 MG tablet    Sig: Take 1 tablet (0.2 mg total) by mouth at bedtime.    Dispense:  60 tablet    Refill:  4    [employer] nurse fund   desvenlafaxine (PRISTIQ) 25 MG 24 hr tablet    Sig: Take 3 tablets (75 mg total) by mouth daily.    Dispense:  60 tablet    Refill:  3    [employer] nurse fund   gabapentin (NEURONTIN) 600 MG tablet    Sig: Take 2 tablets (1,200 mg total) by mouth 2 (two) times daily.    Dispense:  120 tablet    Refill:  3    [employer] nurse fund   mirtazapine (REMERON) 30 MG tablet    Sig: Take 1 tablet (30 mg total) by mouth at bedtime.    Dispense:  30 tablet    Refill:  3    [employer] nurse fund   OLANZapine (ZYPREXA) 2.5 MG tablet    Sig: Take 1 tablet (2.5 mg total) by mouth at bedtime as needed (insomnia/sleep).    Dispense:  30 tablet    Refill:  3    [employer] nurse fund   pantoprazole (PROTONIX) 20 MG tablet    Sig: Take 1 tablet (20 mg total) by mouth daily.    Dispense:  60 tablet    Refill:  4    [employer] nurse fund   propranolol (INDERAL) 10 MG tablet    Sig: Take 1 tablet (10 mg total) by mouth every morning.    Dispense:  60 tablet    Refill:  0    [employer] nurse fund    Follow-up: No follow-ups on file.    PAsencion Noble MD

## 2021-10-24 ENCOUNTER — Other Ambulatory Visit: Payer: Self-pay

## 2021-10-24 ENCOUNTER — Ambulatory Visit: Payer: Self-pay | Attending: Critical Care Medicine | Admitting: Critical Care Medicine

## 2021-10-24 ENCOUNTER — Other Ambulatory Visit (HOSPITAL_COMMUNITY): Payer: Self-pay

## 2021-10-24 ENCOUNTER — Encounter: Payer: Self-pay | Admitting: Critical Care Medicine

## 2021-10-24 VITALS — BP 111/76 | HR 82 | Resp 16 | Wt 175.4 lb

## 2021-10-24 DIAGNOSIS — F3341 Major depressive disorder, recurrent, in partial remission: Secondary | ICD-10-CM

## 2021-10-24 DIAGNOSIS — Z1211 Encounter for screening for malignant neoplasm of colon: Secondary | ICD-10-CM

## 2021-10-24 DIAGNOSIS — Z1231 Encounter for screening mammogram for malignant neoplasm of breast: Secondary | ICD-10-CM

## 2021-10-24 DIAGNOSIS — E782 Mixed hyperlipidemia: Secondary | ICD-10-CM

## 2021-10-24 DIAGNOSIS — I10 Essential (primary) hypertension: Secondary | ICD-10-CM

## 2021-10-24 DIAGNOSIS — Z23 Encounter for immunization: Secondary | ICD-10-CM

## 2021-10-24 DIAGNOSIS — N179 Acute kidney failure, unspecified: Secondary | ICD-10-CM

## 2021-10-24 DIAGNOSIS — F4310 Post-traumatic stress disorder, unspecified: Secondary | ICD-10-CM

## 2021-10-24 DIAGNOSIS — F332 Major depressive disorder, recurrent severe without psychotic features: Secondary | ICD-10-CM

## 2021-10-24 DIAGNOSIS — I071 Rheumatic tricuspid insufficiency: Secondary | ICD-10-CM

## 2021-10-24 MED ORDER — DESVENLAFAXINE SUCCINATE ER 25 MG PO TB24
75.0000 mg | ORAL_TABLET | Freq: Every day | ORAL | 3 refills | Status: DC
  Filled 2021-10-24: qty 60, 20d supply, fill #0
  Filled 2021-10-25: qty 60, 20d supply, fill #1

## 2021-10-24 MED ORDER — CLONIDINE HCL 0.2 MG PO TABS
0.2000 mg | ORAL_TABLET | Freq: Every day | ORAL | 4 refills | Status: DC
Start: 2021-10-24 — End: 2021-11-28
  Filled 2021-10-24: qty 30, 30d supply, fill #0
  Filled 2021-11-24: qty 30, 30d supply, fill #1

## 2021-10-24 MED ORDER — PROPRANOLOL HCL 10 MG PO TABS
10.0000 mg | ORAL_TABLET | Freq: Every morning | ORAL | 0 refills | Status: DC
  Filled 2021-10-24: qty 30, 30d supply, fill #0
  Filled 2021-11-24: qty 30, 30d supply, fill #1

## 2021-10-24 MED ORDER — OLANZAPINE 2.5 MG PO TABS
2.5000 mg | ORAL_TABLET | Freq: Every evening | ORAL | 3 refills | Status: DC | PRN
  Filled 2021-10-24: qty 30, 30d supply, fill #0

## 2021-10-24 MED ORDER — PANTOPRAZOLE SODIUM 20 MG PO TBEC
20.0000 mg | DELAYED_RELEASE_TABLET | Freq: Every day | ORAL | 4 refills | Status: DC
  Filled 2021-10-24: qty 30, 30d supply, fill #0
  Filled 2021-11-24: qty 30, 30d supply, fill #1

## 2021-10-24 MED ORDER — ATORVASTATIN CALCIUM 20 MG PO TABS
20.0000 mg | ORAL_TABLET | Freq: Every day | ORAL | 0 refills | Status: DC
  Filled 2021-10-24: qty 30, 30d supply, fill #0
  Filled 2021-11-24: qty 30, 30d supply, fill #1

## 2021-10-24 MED ORDER — GABAPENTIN 600 MG PO TABS
1200.0000 mg | ORAL_TABLET | Freq: Two times a day (BID) | ORAL | 3 refills | Status: DC
  Filled 2021-10-24: qty 120, 30d supply, fill #0

## 2021-10-24 MED ORDER — MIRTAZAPINE 30 MG PO TABS
30.0000 mg | ORAL_TABLET | Freq: Every day | ORAL | 3 refills | Status: DC
  Filled 2021-10-24: qty 30, 30d supply, fill #0

## 2021-10-24 NOTE — Assessment & Plan Note (Signed)
Plan refill of propranolol for her PTSD syndrome

## 2021-10-24 NOTE — Assessment & Plan Note (Signed)
History of tricuspid regurgitation was seen in March 2022 echocardiogram I referred her to cardiology she missed her last appointment we will make another referral

## 2021-10-24 NOTE — Assessment & Plan Note (Signed)
Based on last lab this is resolved

## 2021-10-24 NOTE — Assessment & Plan Note (Signed)
Plan to refill patient's statin therapy

## 2021-10-24 NOTE — Patient Instructions (Signed)
Pick up colon cancer screen test  Mammogram was ordered with scholarship program  Cardiology referral placed  [facility] medications [facility] under nurse [facility]  No medication changes  Return to Dr Joya Gaskins 3 months

## 2021-10-24 NOTE — Assessment & Plan Note (Signed)
Hypertension well controlled on clonidine refills were given

## 2021-10-24 NOTE — Assessment & Plan Note (Addendum)
Symptom complex improved on current program she will need refills on her medications this was sent to [facility] outpatient pharmacy under the nurse assistance fund as she has no insurance or money to pay for medicines  Patient to pick up [facility] health patient assistance application for blue card

## 2021-10-25 ENCOUNTER — Other Ambulatory Visit (HOSPITAL_COMMUNITY): Payer: Self-pay

## 2021-11-02 ENCOUNTER — Telehealth (INDEPENDENT_AMBULATORY_CARE_PROVIDER_SITE_OTHER): Payer: No Payment, Other | Admitting: Psychiatry

## 2021-11-02 ENCOUNTER — Encounter (HOSPITAL_COMMUNITY): Payer: Self-pay | Admitting: Psychiatry

## 2021-11-02 DIAGNOSIS — F3341 Major depressive disorder, recurrent, in partial remission: Secondary | ICD-10-CM | POA: Diagnosis not present

## 2021-11-02 DIAGNOSIS — F4310 Post-traumatic stress disorder, unspecified: Secondary | ICD-10-CM | POA: Diagnosis not present

## 2021-11-02 LAB — FECAL OCCULT BLOOD, IMMUNOCHEMICAL: Fecal Occult Bld: NEGATIVE

## 2021-11-02 MED ORDER — GABAPENTIN 600 MG PO TABS: 1200.0000 mg | ORAL_TABLET | Freq: Two times a day (BID) | ORAL | 3 refills | Status: DC

## 2021-11-02 MED ORDER — MIRTAZAPINE 30 MG PO TABS: 30.0000 mg | ORAL_TABLET | Freq: Every day | ORAL | 3 refills | Status: DC

## 2021-11-02 MED ORDER — DESVENLAFAXINE SUCCINATE ER 25 MG PO TB24: 75.0000 mg | ORAL_TABLET | Freq: Every day | ORAL | 3 refills | Status: DC

## 2021-11-02 MED ORDER — OLANZAPINE 2.5 MG PO TABS: 2.5000 mg | ORAL_TABLET | Freq: Every evening | ORAL | 3 refills | Status: DC | PRN

## 2021-11-02 MED ORDER — HYDROXYZINE HCL 10 MG PO TABS: 10.0000 mg | ORAL_TABLET | Freq: Three times a day (TID) | ORAL | 3 refills | Status: DC | PRN

## 2021-11-02 NOTE — Progress Notes (Signed)
BH MD/PA/NP OP Progress Note Virtual Visit via Video Note  I connected with Margaret Mccann on 11/02/21 at  1:30 PM EST by a video enabled telemedicine application and verified that I am speaking with the correct person using two identifiers.  Location: Patient: Home Provider: Clinic   I discussed the limitations of evaluation and management by telemedicine and the availability of in person appointments. The patient expressed understanding and agreed to proceed.  I provided 30 minutes of non-face-to-face time during this encounter.   11/02/2021 1:44 PM Margaret Mccann  MRN:  [id number]  Chief Complaint: "Going to [place] has helped me"    HPI: 51 year old female seen today for follow-up psychiatric evaluation.   She has a psychiatric history of depression, anxiety, PTSD, and substance use (cocaine, amphetamine use, stimulants, and alcohol).  She is managed on gabapentin 600 mg twice daily, Remeron 30 mg nightly, Pristiq 75 mg daily, Zyprexa 2.5 mg nightly (takes as needed), and hydroxyzine 25 mg 3 times daily. She is also prescribed clonidine and propanolol by her PCP. She notes her medications are effective in managing her psychiatric conditions.  Today she is well-groomed, pleasant, cooperative, engaged in conversation, maintained eye contact.  She informed provider that she has been attending AA daily and notes that its been helpful. She notes that AA is her grounding compass. She notes that she relapsed with wine during the Christmas holiday but notes that she has been maintaining her sobriety since. Overall patient notes that her mood is stable and reports that she has minimal anxiety. A GAD 7 was conducted on 10/24/2021 and patient scored a 4, at her last visits she scored a 3.A PHQ 9 was also conducted and patient scored a 3, at her last visit she scored a 1. She endorses adequate sleep and appetite.  Today she denies SI/HI/VAH, mania, paranoia.    Patient notes that she  no longer work at [employer] but now at [employer]. She reports that she enjoys her current job as it is more flexible.    No medication changes made today.  Patient agreeable to continue medication as prescribed.  No other concerns noted at this time.     Visit Diagnosis:    ICD-10-CM   1. Major depressive disorder, recurrent episode, in partial remission with anxious distress (HCC)  F33.41 OLANZapine (ZYPREXA) 2.5 MG tablet    mirtazapine (REMERON) 30 MG tablet    gabapentin (NEURONTIN) 600 MG tablet    2. PTSD (post-traumatic stress disorder)  F43.10 mirtazapine (REMERON) 30 MG tablet      Past Psychiatric History:  MDD , anxiety, PTSD, Substance abuse (cocaine, alcohol)  Past Medical History:  Past Medical History:  Diagnosis Date   Alcohol withdrawal seizure with complication, with unspecified complication ([place]) 0000000   Anxiety    Bipolar 1 disorder ([place])    Depression    Intentional drug overdose ([place]) 08/04/2020   Major depressive disorder, recurrent episode with anxious distress ([place]) 08/11/2020   Morgellons syndrome    Suicide attempt ([place])    Suicide by drug overdose ([place]) 08/11/2020   Tricuspid valve regurgitation 12/18/2020    Past Surgical History:  Procedure Laterality Date   APPENDECTOMY      Family Psychiatric History: Sister - committed suicide in 2005 by overdosing.  Family History:  Family History  Problem Relation Age of Onset   Heart attack Father 53   Stroke Father    Severe combined immunodeficiency Sister  Breast cancer Paternal Aunt    Ulcerative colitis Neg Hx    Esophageal cancer Neg Hx     Social History:  Social History   Socioeconomic History   Marital status: Single    Spouse name: Not on file   Number of children: Not on file   Years of education: Not on file   Highest education level: Not on file  Occupational History   Not on file  Tobacco Use   Smoking status: Former    Packs/day: 0.50    Types: Cigarettes    Smokeless tobacco: Never  Vaping Use   Vaping Use: Never used  Substance and Sexual Activity   Alcohol use: Yes    Comment: occas   Drug use: Not Currently    Comment: not currently-was +cocaine and amphetamines, clean x3 years   Sexual activity: Yes    Birth control/protection: Pill  Other Topics Concern   Not on file  Social History Narrative   ** Merged History Encounter **       Social Determinants of Health   Financial Resource Strain: Not on file  Food Insecurity: Not on file  Transportation Needs: Not on file  Physical Activity: Not on file  Stress: Not on file  Social Connections: Not on file    Allergies:  Allergies  Allergen Reactions   Lamictal [Lamotrigine] Anaphylaxis, Rash and Other (See Comments)    Stevens-Johnson syndrome and fevers, also   Lamictal [Lamotrigine] Anaphylaxis, Rash and Other (See Comments)    Fevers and Stevens-Johnson syndrome, also   Tramadol Other (See Comments)    Pt had a seizure after taking Tramadol!!   Sulfamethoxazole-Trimethoprim Rash   Erythromycin Nausea And Vomiting   Erythromycin Base Nausea And Vomiting   Geodon [Ziprasidone Hcl] Rash   Levetiracetam Anxiety   Sulfa Antibiotics Rash    Metabolic Disorder Labs: Lab Results  Component Value Date   HGBA1C 5.6 12/19/2020   MPG 114.02 12/19/2020   No results found for: PROLACTIN Lab Results  Component Value Date   CHOL 183 05/16/2021   TRIG 118 05/16/2021   HDL 94 05/16/2021   CHOLHDL 1.9 05/16/2021   LDLCALC 69 05/16/2021   Lab Results  Component Value Date   TSH 1.300 05/16/2021    Therapeutic Level Labs: No results found for: LITHIUM No results found for: VALPROATE No components found for:  CBMZ  Current Medications: Current Outpatient Medications  Medication Sig Dispense Refill   hydrOXYzine (ATARAX) 10 MG tablet Take 1 tablet (10 mg total) by mouth 3 (three) times daily as needed. 90 tablet 3   atorvastatin (LIPITOR) 20 MG tablet Take 1 tablet  (20 mg total) by mouth at bedtime. 60 tablet 0   cloNIDine (CATAPRES) 0.2 MG tablet Take 1 tablet (0.2 mg total) by mouth at bedtime. 60 tablet 4   desvenlafaxine (PRISTIQ) 25 MG 24 hr tablet Take 3 tablets (75 mg total) by mouth daily. 60 tablet 3   gabapentin (NEURONTIN) 600 MG tablet Take 2 tablets (1,200 mg total) by mouth 2 (two) times daily. 120 tablet 3   mirtazapine (REMERON) 30 MG tablet Take 1 tablet (30 mg total) by mouth at bedtime. 30 tablet 3   OLANZapine (ZYPREXA) 2.5 MG tablet Take 1 tablet (2.5 mg total) by mouth at bedtime as needed (insomnia/sleep). 30 tablet 3   pantoprazole (PROTONIX) 20 MG tablet Take 1 tablet (20 mg total) by mouth daily. 60 tablet 4   propranolol (INDERAL) 10 MG tablet Take 1 tablet (10  mg total) by mouth every morning. 60 tablet 0   No current facility-administered medications for this visit.     Musculoskeletal: Strength & Muscle Tone: within normal limits, telehealth visit Gait & Station: normal, telehealth visit Patient leans: N/A  Psychiatric Specialty Exam: Review of Systems  There were no vitals taken for this visit.There is no height or weight on file to calculate BMI.  General Appearance: Well Groomed  Eye Contact:  Good  Speech:  Clear and Coherent and Normal Rate  Volume:  Normal  Mood:  Euthymic  Affect:  Appropriate and Congruent  Thought Process:  Coherent, Goal Directed, and Linear  Orientation:  Full (Time, Place, and Person)  Thought Content: WDL and Logical   Suicidal Thoughts:  No  Homicidal Thoughts:  No  Memory:  Immediate;   Good Recent;   Good Remote;   Good  Judgement:  Good  Insight:  Good  Psychomotor Activity:  Normal  Concentration:  Concentration: Good and Attention Span: Good  Recall:  Good  Fund of Knowledge: Good  Language: Good  Akathisia:  No  Handed:  Right  AIMS (if indicated): not done  Assets:  Communication Skills Desire for Improvement Financial Resources/Insurance Housing  ADL's:  Intact   Cognition: WNL  Sleep:  Good   Screenings: AIMS    Flowsheet Row Admission (Discharged) from 06/04/2018 in [facility]  AIMS Total Score 0      AUDIT    Flowsheet Row Admission (Discharged) from 08/11/2020 in [facility] Admission (Discharged) from 06/04/2018 in [facility]  Alcohol Use Disorder Identification Test Final Score (AUDIT) 11 0      GAD-7    Flowsheet Row Office Visit from 10/24/2021 in [facility] Video Visit from 08/09/2021 in [facility] Office Visit from 05/16/2021 in [facility] from 05/09/2021 in [facility] Office Visit from 03/16/2021 in [facility]  Total GAD-7 Score 4 2 4 8 2       PHQ2-9    [place] Office Visit from 10/24/2021 in [facility] Video Visit from 08/09/2021 in [facility] Office Visit from 05/16/2021 in [place] from 05/09/2021 in [facility] Office Visit from 03/16/2021 in [facility]  PHQ-2 Total Score 1 0 0 1 0  PHQ-9 Total Score 3 1 -- 7 3      Flowsheet Row Video Visit from 08/09/2021 in [facility] ED from 08/05/2021 in [facility] ED from 08/01/2021 in [facility] DEPT  C-SSRS RISK CATEGORY No Risk No Risk No Risk        Assessment and Plan: Patient notes that she is doing well on her current medication regimen. No medication changes made today.  Patient agreeable to continue medication as prescribed.   1. Major depressive disorder, recurrent episode, in partial remission with anxious distress (HCC)  Continue- gabapentin  (NEURONTIN) 600 MG tablet; Take 2 tablets (1,200 mg total) by mouth 2 (two) times daily.  Dispense: 120 tablet; Refill: 3 Continue- mirtazapine (REMERON) 30 MG tablet; Take 1 tablet (30 mg total) by mouth at bedtime.  Dispense: 30 tablet; Refill: 3 Continue- desvenlafaxine (PRISTIQ) 50 MG 24 hr tablet; Take one and a half tablets daily (75  mg)  Dispense: 45 tablet; Refill: 3 Continue- OLANZapine (ZYPREXA) 2.5 MG tablet; Take 1 tablet (2.5 mg total) by mouth at bedtime as needed (insomnia/sleep).  Dispense: 15 tablet; Refill: 3  2. PTSD (post-traumatic stress disorder)  Continue- hydrOXYzine (ATARAX/VISTARIL) 25 MG tablet; Take 2 tablets twice daily as needed for anxiety and take 2 tablets at bedtime as needed for sleep  Dispense: 120 tablet; Refill: 3 Continue- mirtazapine (REMERON) 30 MG tablet; Take 1 tablet (30 mg total) by mouth at bedtime.  Dispense: 30 tablet; Refill: 3 Continue- desvenlafaxine (PRISTIQ) 50 MG 24 hr tablet; Take one and a half tablets daily (75 mg)  Dispense: 45 tablet; Refill: 3    Follow up in 3 months  Salley Slaughter, NP 11/02/2021, 1:44 PM

## 2021-11-04 ENCOUNTER — Telehealth: Payer: Self-pay

## 2021-11-04 NOTE — Telephone Encounter (Signed)
Copied from CRM [id number]. Topic: General - Other >> Nov 02, 2021  4:51 PM Izora Ribas, Everette A wrote: Reason for CRM: The patient has called requesting additional information from staff about a place that they can go to for a back brace or assistance with coverage of a back brace  The patient has started a new job recently that is causing discomfort on their previously injured back   The patient has called requesting additional information on potential medical suppliers that may be able to work with them on the basis of their income   Please contact further when possible

## 2021-11-07 ENCOUNTER — Telehealth: Payer: Self-pay

## 2021-11-07 NOTE — Telephone Encounter (Signed)
Called pt and left vm  

## 2021-11-07 NOTE — Telephone Encounter (Signed)
-----   Message from Storm Frisk, MD sent at 11/03/2021  6:34 AM EST ----- Let pt know colon cancer screen is negative

## 2021-11-07 NOTE — Telephone Encounter (Signed)
Pt was called and vm was left, Information has been sent to nurse pool.   

## 2021-11-08 ENCOUNTER — Ambulatory Visit: Payer: Self-pay

## 2021-11-10 NOTE — Progress Notes (Signed)
Cardiology Office Note:    Date:  11/11/2021   ID:  Margaret Mccann, DOB September 19, 1971, MRN [id number]  PCP:  Margaret Stain, MD   [facility] Providers Cardiologist:  Margaret Sciara, MD Referring MD: Margaret Stain, MD   Chief Complaint/Reason for Referral: Tricuspid regurgitation  ASSESSMENT:    Tricuspid valve insufficiency, unspecified etiology  Essential hypertension  Mixed hyperlipidemia  Chest pain of uncertain etiology  Dyspnea, unspecified type    PLAN:    In order of problems listed above:  1.  I reviewed her echocardiogram from 2022 which does demonstrate good deal of tricuspid regurgitation.  I will repeat an echocardiogram now.  We may need to perform a right heart catheterization depending on these results.  Follow-up in 1 year or earlier if needed 2.  Blood pressure is at goal today. 3.  This is being followed by her primary care provider. 4.  We will obtain coronary CTA.  This would also inform the need for aspirin. 5.  We will obtain echocardiogram to evaluate.  I have a feeling this is due to her 60 pound weight gain.            Dispo:  Return in about 1 year (around 11/11/2022).     Medication Adjustments/Labs and Tests Ordered: Current medicines are reviewed at length with the patient today.  Concerns regarding medicines are outlined above.   Tests Ordered: No orders of the defined types were placed in this encounter.   Medication Changes: No orders of the defined types were placed in this encounter.   History of Present Illness:    FOCUSED PROBLEM LIST:   1.  Hypertension 2.  Tricuspid regurgitation 3.  Tobacco abuse   The patient is a 51 y.o. female with the indicated medical history here for recommendations regarding incidentally noted significant tricuspid regurgitation seen on echocardiogram March 2022.  The patient recently became sober changed jobs and is living with her parents.  Over the last 2 and half year  she has gained about 60 pounds.  She has started a new job and seems to like it.  She is very stressed living with her family.  She has taken up smoking again.  Occasionally she will get chest pain when she walks.  Is happening for about 3 months.  It does not occur every time she walks but maybe once every few weeks.  She has had some shortness of breath as well when she goes upstairs.  She denies any peripheral edema, paroxysmal nocturnal dyspnea, orthopnea.  She has required no emergency room visits or hospitalizations.        Previous Medical History: Past Medical History:  Diagnosis Date   Alcohol withdrawal seizure with complication, with unspecified complication ([place]) 0000000   Anxiety    Bipolar 1 disorder ([place])    Depression    Intentional drug overdose ([place]) 08/04/2020   Major depressive disorder, recurrent episode with anxious distress ([place]) 08/11/2020   Morgellons syndrome    Suicide attempt ([place])    Suicide by drug overdose ([place]) 08/11/2020   Tricuspid valve regurgitation 12/18/2020     Current Medications: Current Meds  Medication Sig   atorvastatin (LIPITOR) 20 MG tablet Take 1 tablet (20 mg total) by mouth at bedtime.   cloNIDine (CATAPRES) 0.2 MG tablet Take 1 tablet (0.2 mg total) by mouth at bedtime.   desvenlafaxine (PRISTIQ) 25 MG 24 hr tablet Take 3 tablets (75 mg total) by mouth daily.   gabapentin (  NEURONTIN) 600 MG tablet Take 2 tablets (1,200 mg total) by mouth 2 (two) times daily.   hydrOXYzine (ATARAX) 10 MG tablet Take 1 tablet (10 mg total) by mouth 3 (three) times daily as needed.   mirtazapine (REMERON) 30 MG tablet Take 1 tablet (30 mg total) by mouth at bedtime.   pantoprazole (PROTONIX) 20 MG tablet Take 1 tablet (20 mg total) by mouth daily.   propranolol (INDERAL) 10 MG tablet Take 1 tablet (10 mg total) by mouth every morning.     Allergies:    Lamictal [lamotrigine], Lamictal [lamotrigine], Tramadol, Sulfamethoxazole-trimethoprim,  Erythromycin, Erythromycin base, Geodon [ziprasidone hcl], Levetiracetam, and Sulfa antibiotics   Social History:   Social History   Tobacco Use   Smoking status: Former    Packs/day: 0.50    Types: Cigarettes   Smokeless tobacco: Never  Vaping Use   Vaping Use: Never used  Substance Use Topics   Alcohol use: Yes    Comment: occas   Drug use: Not Currently    Comment: not currently-was +cocaine and amphetamines, clean x3 years     Family Hx: Family History  Problem Relation Age of Onset   Heart attack Father 8   Stroke Father    Severe combined immunodeficiency Sister    Breast cancer Paternal Aunt    Ulcerative colitis Neg Hx    Esophageal cancer Neg Hx      Review of Systems:   Please see the history of present illness.    All other systems reviewed and are negative.     EKGs/Labs/Other Test Reviewed:    EKG: EKG today demonstrates sinus rhythm with left anterior fascicular block; prior EKG demonstrates sinus tachycardia  Prior CV studies:  TTE 2022  1. Left ventricular ejection fraction, by estimation, is 55 to 60%. The  left ventricle has normal function. The left ventricle has no regional  wall motion abnormalities. Left ventricular diastolic parameters were  normal.   2. Right ventricular systolic function is normal. The right ventricular  size is normal. There is normal pulmonary artery systolic pressure.   3. The mitral valve is normal in structure. Mild mitral valve  regurgitation. No evidence of mitral stenosis.   4. Tricuspid valve regurgitation is severe.   5. The aortic valve is normal in structure. Aortic valve regurgitation is  not visualized. No aortic stenosis is present.   6. The inferior vena cava is normal in size with greater than 50%  respiratory variability, suggesting right atrial pressure of 3 mmHg.   Imaging studies that I have independently reviewed today: TTE  Recent Labs: 12/22/2020: Magnesium 2.1 05/16/2021: TSH  1.300 08/01/2021: ALT 33; BUN 20; Creatinine, Ser 0.64; Hemoglobin 13.5; Platelets 348; Potassium 4.2; Sodium 136   Recent Lipid Panel Lab Results  Component Value Date/Time   CHOL 183 05/16/2021 09:30 AM   TRIG 118 05/16/2021 09:30 AM   HDL 94 05/16/2021 09:30 AM   LDLCALC 69 05/16/2021 09:30 AM    Risk Assessment/Calculations:          Physical Exam:    VS:  BP 124/80    Pulse 90    Ht 5\' 3"  (1.6 m)    Wt 176 lb 12.8 oz (80.2 kg)    SpO2 95%    BMI 31.32 kg/m    Wt Readings from Last 3 Encounters:  11/11/21 176 lb 12.8 oz (80.2 kg)  10/24/21 175 lb 6.4 oz (79.6 kg)  08/05/21 154 lb 15.7 oz (70.3 kg)  GENERAL:  No apparent distress, AOx3 HEENT:  No carotid bruits, +2 carotid impulses, no scleral icterus CAR: RRR  no murmurs, gallops, rubs, or thrills RES:  Clear to auscultation bilaterally ABD:  Soft, nontender, nondistended, positive bowel sounds x 4 VASC:  +2 radial pulses, +2 carotid pulses, palpable pedal pulses NEURO:  CN 2-12 grossly intact; motor and sensory grossly intact PSYCH:  No active depression or anxiety EXT:  No edema, ecchymosis, or cyanosis  Signed, Early Osmond, MD  11/11/2021 10:39 AM    [address], [place], [place]  [address] Phone: [phone]; Fax: [phone]   Note:  This document was prepared using Dragon voice recognition software and may include unintentional dictation errors.

## 2021-11-11 ENCOUNTER — Encounter: Payer: Self-pay | Admitting: Internal Medicine

## 2021-11-11 ENCOUNTER — Other Ambulatory Visit: Payer: Self-pay

## 2021-11-11 ENCOUNTER — Ambulatory Visit (INDEPENDENT_AMBULATORY_CARE_PROVIDER_SITE_OTHER): Payer: Self-pay | Admitting: Internal Medicine

## 2021-11-11 VITALS — BP 124/80 | HR 90 | Ht 63.0 in | Wt 176.8 lb

## 2021-11-11 DIAGNOSIS — I071 Rheumatic tricuspid insufficiency: Secondary | ICD-10-CM

## 2021-11-11 DIAGNOSIS — R072 Precordial pain: Secondary | ICD-10-CM

## 2021-11-11 DIAGNOSIS — I10 Essential (primary) hypertension: Secondary | ICD-10-CM

## 2021-11-11 DIAGNOSIS — R079 Chest pain, unspecified: Secondary | ICD-10-CM

## 2021-11-11 DIAGNOSIS — E782 Mixed hyperlipidemia: Secondary | ICD-10-CM

## 2021-11-11 DIAGNOSIS — R0600 Dyspnea, unspecified: Secondary | ICD-10-CM

## 2021-11-11 DIAGNOSIS — Z01812 Encounter for preprocedural laboratory examination: Secondary | ICD-10-CM

## 2021-11-11 LAB — BASIC METABOLIC PANEL
BUN/Creatinine Ratio: 14 (ref 9–23)
BUN: 10 mg/dL (ref 6–24)
CO2: 22 mmol/L (ref 20–29)
Calcium: 10.3 mg/dL — ABNORMAL HIGH (ref 8.7–10.2)
Chloride: 99 mmol/L (ref 96–106)
Creatinine, Ser: 0.73 mg/dL (ref 0.57–1.00)
Glucose: 118 mg/dL — ABNORMAL HIGH (ref 70–99)
Potassium: 4.5 mmol/L (ref 3.5–5.2)
Sodium: 136 mmol/L (ref 134–144)
eGFR: 101 mL/min/{1.73_m2} (ref >59)

## 2021-11-11 MED ORDER — METOPROLOL TARTRATE 100 MG PO TABS: 100.0000 mg | ORAL_TABLET | Freq: Once | ORAL | 0 refills | Status: DC

## 2021-11-11 NOTE — Patient Instructions (Addendum)
Medication Instructions:  No changes *If you need a refill on your cardiac medications before your next appointment, please call your pharmacy*   Lab Work: none   Testing/Procedures: Your physician has requested that you have an echocardiogram. Echocardiography is a painless test that uses sound waves to create images of your heart. It provides your doctor with information about the size and shape of your heart and how well your hearts chambers and valves are working. This procedure takes approximately one hour. There are no restrictions for this procedure.  Cardiac CTA - see instructions below.   Follow-Up: At [facility], you and your health needs are our priority.  As part of our continuing mission to provide you with exceptional heart care, we have created designated Provider Care Teams.  These Care Teams include your primary Cardiologist (physician) and Advanced Practice Providers (APPs -  Physician Assistants and Nurse Practitioners) who all work together to provide you with the care you need, when you need it.   Your next appointment:   12 month(s)  The format for your next appointment:   In Person  Provider:   Early Osmond, MD     Other Instructions   Your cardiac CT will be scheduled at  [facility] [place], [place] [address] [phone]  Please arrive at the [facility] main entrance (entrance A) of [facility] 30 minutes prior to test start time. You can use the FREE valet parking offered at the main entrance (encouraged to control the heart rate for the test) Proceed to the [facility] Radiology Department (first floor) to check-in and test prep.   Please follow these instructions carefully (unless otherwise directed):  On the Night Before the Test: Be sure to Drink plenty of water. Do not consume any caffeinated/decaffeinated beverages or chocolate 12 hours prior to your test. Do not take any antihistamines 12  hours prior to your test.  On the Day of the Test: Drink plenty of water until 1 hour prior to the test. Do not eat any food 4 hours prior to the test. You may take your regular medications prior to the test.  Take metoprolol (Lopressor) two hours prior to test. FEMALES- please wear underwire-free bra if available, avoid dresses & tight clothing       After the Test: Drink plenty of water. After receiving IV contrast, you may experience a mild flushed feeling. This is normal. On occasion, you may experience a mild rash up to 24 hours after the test. This is not dangerous. If this occurs, you can take Benadryl 25 mg and increase your fluid intake. If you experience trouble breathing, this can be serious. If it is severe call 911 IMMEDIATELY. If it is mild, please call our office. If you take any of these medications: Glipizide/Metformin, Avandament, Glucavance, please do not take 48 hours after completing test unless otherwise instructed.  We will call to schedule your test 2-4 weeks out understanding that some insurance companies will need an authorization prior to the service being performed.   For non-scheduling related questions, please contact the cardiac imaging nurse navigator should you have any questions/concerns: Marchia Bond, Cardiac Imaging Nurse Navigator Gordy Clement, Cardiac Imaging Nurse Navigator [facility] Direct Office Dial: [phone]   For scheduling needs, including cancellations and rescheduling, please call [place], [phone].

## 2021-11-14 ENCOUNTER — Ambulatory Visit: Payer: Self-pay | Attending: Critical Care Medicine

## 2021-11-14 ENCOUNTER — Other Ambulatory Visit: Payer: Self-pay

## 2021-11-14 DIAGNOSIS — Z1231 Encounter for screening mammogram for malignant neoplasm of breast: Secondary | ICD-10-CM

## 2021-11-23 ENCOUNTER — Telehealth (HOSPITAL_COMMUNITY): Payer: Self-pay | Admitting: Emergency Medicine

## 2021-11-23 NOTE — Telephone Encounter (Signed)
Attempted to call patient regarding upcoming cardiac CT appointment. °Left message on voicemail with name and callback number °Jarissa Sheriff RN Navigator Cardiac Imaging °[facility] °[phone] Office °[phone] Cell ° °

## 2021-11-23 NOTE — Telephone Encounter (Signed)
Reaching out to patient to offer assistance regarding upcoming cardiac imaging study; pt verbalizes understanding of appt date/time, parking situation and where to check in, pre-test NPO status and medications ordered, and verified current allergies; name and call back number provided for further questions should they arise ?Rockwell Alexandria RN Navigator Cardiac Imaging ?[facility] ?[phone] office ?[phone] cell ? ?Difficult IV ?100mg  metoprolol  ?Arrival 900 ?

## 2021-11-24 ENCOUNTER — Other Ambulatory Visit (HOSPITAL_COMMUNITY): Payer: Self-pay

## 2021-11-24 ENCOUNTER — Other Ambulatory Visit: Payer: Self-pay | Admitting: Critical Care Medicine

## 2021-11-24 ENCOUNTER — Ambulatory Visit (HOSPITAL_COMMUNITY)
Admission: RE | Admit: 2021-11-24 | Discharge: 2021-11-24 | Disposition: A | Discharge status: Home / Self Care | Payer: Self-pay | Source: Ambulatory Visit | Attending: Internal Medicine | Admitting: Internal Medicine

## 2021-11-24 ENCOUNTER — Other Ambulatory Visit: Payer: Self-pay

## 2021-11-24 DIAGNOSIS — R072 Precordial pain: Secondary | ICD-10-CM

## 2021-11-24 DIAGNOSIS — F3341 Major depressive disorder, recurrent, in partial remission: Secondary | ICD-10-CM

## 2021-11-24 DIAGNOSIS — F4310 Post-traumatic stress disorder, unspecified: Secondary | ICD-10-CM

## 2021-11-24 MED ORDER — NITROGLYCERIN 0.4 MG SL SUBL
0.8000 mg | SUBLINGUAL_TABLET | Freq: Once | SUBLINGUAL | Status: AC
  Administered 2021-11-24: 0.8 mg via SUBLINGUAL

## 2021-11-24 MED ORDER — IOHEXOL 350 MG/ML SOLN
95.0000 mL | Freq: Once | INTRAVENOUS | Status: AC | PRN
  Administered 2021-11-24: 95 mL via INTRAVENOUS

## 2021-11-24 MED ORDER — NITROGLYCERIN 0.4 MG SL SUBL
SUBLINGUAL_TABLET | SUBLINGUAL | Status: AC
  Filled 2021-11-24: qty 2

## 2021-11-24 NOTE — Telephone Encounter (Signed)
Requested medication (s) are due for refill today - no- but may need transfer to different pharmacy  Requested medication (s) are on the active medication list -yes  Future visit scheduled -yes  Last refill: 11/02/21  Notes to clinic: Rx request sent for review- may have been sent to incorrect pharmacy- sent to [facility] 11/02/21  Requested Prescriptions  Pending Prescriptions Disp Refills   gabapentin (NEURONTIN) 600 MG tablet 120 tablet 3    Sig: Take 2 tablets (1,200 mg total) by mouth 2 (two) times daily.     Neurology: Anticonvulsants - gabapentin Passed - 11/24/2021  8:06 AM      Passed - Cr in normal range and within 360 days    Creatinine, Ser  Date Value Ref Range Status  11/11/2021 0.73 0.57 - 1.00 mg/dL Final          Passed - Completed PHQ-2 or PHQ-9 in the last 360 days      Passed - Valid encounter within last 12 months    Recent Outpatient Visits           1 month ago Essential hypertension   [place] Elsie Stain, MD   6 months ago Essential hypertension   [place], MD       Future Appointments             In 3 weeks Ladell Pier, MD [place]   In 2 months Elsie Stain, MD [place]             mirtazapine (REMERON) 30 MG tablet 30 tablet 3    Sig: Take 1 tablet (30 mg total) by mouth at bedtime.     Psychiatry: Antidepressants - mirtazapine Passed - 11/24/2021  8:06 AM      Passed - Completed PHQ-2 or PHQ-9 in the last 360 days      Passed - Valid encounter within last 6 months    Recent Outpatient Visits           1 month ago Essential hypertension   [place], MD   6 months ago Essential hypertension   [place], MD       Future Appointments             In 3 weeks Ladell Pier, MD [place]   In 2 months Elsie Stain, MD [place]             OLANZapine (ZYPREXA) 2.5 MG tablet 30 tablet 3    Sig: Take 1 tablet (2.5 mg total) by mouth at bedtime as needed (insomnia/sleep).     Not Delegated - Psychiatry:  Antipsychotics - Second Generation (Atypical) - olanzapine Failed - 11/24/2021  8:06 AM      Failed - This refill cannot be delegated      Failed - Lipid Panel in normal range within the last 12 months    Cholesterol, Total  Date Value Ref Range Status  05/16/2021 183 100 - 199 mg/dL Final   LDL Chol Calc (NIH)  Date Value Ref Range Status  05/16/2021 69 0 - 99 mg/dL Final   HDL  Date Value Ref Range Status  05/16/2021 94 >39 mg/dL Final   Triglycerides  Date Value Ref Range  Status  05/16/2021 118 0 - 149 mg/dL Final         Passed - TSH in normal range and within 360 days    TSH  Date Value Ref Range Status  05/16/2021 1.300 0.450 - 4.500 uIU/mL Final          Passed - Completed PHQ-2 or PHQ-9 in the last 360 days      Passed - Last BP in normal range    BP Readings from Last 1 Encounters:  11/24/21 104/89          Passed - Last Heart Rate in normal range    Pulse Readings from Last 1 Encounters:  11/11/21 90          Passed - Valid encounter within last 6 months    Recent Outpatient Visits           1 month ago Essential hypertension   [place] Elsie Stain, MD   6 months ago Essential hypertension   [place], MD       Future Appointments             In 3 weeks Ladell Pier, MD [place]   In 2 months Elsie Stain, MD [place] - CBC within normal limits and completed in the last 12 months    WBC  Date Value Ref Range Status  08/01/2021 9.0 4.0 - 10.5  K/uL Final   RBC  Date Value Ref Range Status  08/01/2021 4.40 3.87 - 5.11 MIL/uL Final   Hemoglobin  Date Value Ref Range Status  08/01/2021 13.5 12.0 - 15.0 g/dL Final  05/16/2021 13.4 11.1 - 15.9 g/dL Final   HCT  Date Value Ref Range Status  08/01/2021 40.1 36.0 - 46.0 % Final   Hematocrit  Date Value Ref Range Status  05/16/2021 39.7 34.0 - 46.6 % Final   MCHC  Date Value Ref Range Status  08/01/2021 33.7 30.0 - 36.0 g/dL Final   [facility]  Date Value Ref Range Status  08/01/2021 30.7 26.0 - 34.0 pg Final   MCV  Date Value Ref Range Status  08/01/2021 91.1 80.0 - 100.0 fL Final  05/16/2021 89 79 - 97 fL Final   No results found for: PLTCOUNTKUC, LABPLAT, POCPLA RDW  Date Value Ref Range Status  08/01/2021 13.9 11.5 - 15.5 % Final  05/16/2021 13.4 11.7 - 15.4 % Final         Passed - CMP within normal limits and completed in the last 12 months    Albumin  Date Value Ref Range Status  08/01/2021 4.3 3.5 - 5.0 g/dL Final  05/16/2021 4.3 3.8 - 4.8 g/dL Final   Alkaline Phosphatase  Date Value Ref Range Status  08/01/2021 88 38 - 126 U/L Final   ALT  Date Value Ref Range Status  08/01/2021 33 0 - 44 U/L Final   AST  Date Value Ref Range Status  08/01/2021 24 15 - 41 U/L Final   BUN  Date Value Ref Range Status  11/11/2021 10 6 - 24 mg/dL Final   Calcium  Date Value Ref Range Status  11/11/2021 10.3 (H) 8.7 - 10.2 mg/dL Final   Calcium, Ion  Date Value Ref Range Status  08/04/2020 1.25 1.15 - 1.40 mmol/L Final   CO2  Date Value Ref Range Status  11/11/2021 22 20 - 29 mmol/L Final   Bicarbonate  Date Value Ref Range Status  12/18/2020 22.9 20.0 - 28.0 mmol/L Final   TCO2  Date Value Ref Range Status  08/04/2020 25 22 - 32 mmol/L Final   Creatinine, Ser  Date Value Ref Range Status  11/11/2021 0.73 0.57 - 1.00 mg/dL Final   Glucose  Date Value Ref Range Status  11/11/2021 118 (H) 70 - 99 mg/dL Final   Glucose, Bld  Date Value Ref Range  Status  08/01/2021 83 70 - 99 mg/dL Final    Comment:    Glucose reference range applies only to samples taken after fasting for at least 8 hours.   Glucose-Capillary  Date Value Ref Range Status  12/24/2020 101 (H) 70 - 99 mg/dL Final    Comment:    Glucose reference range applies only to samples taken after fasting for at least 8 hours.   Potassium  Date Value Ref Range Status  11/11/2021 4.5 3.5 - 5.2 mmol/L Final   Sodium  Date Value Ref Range Status  11/11/2021 136 134 - 144 mmol/L Final   Total Bilirubin  Date Value Ref Range Status  08/01/2021 0.4 0.3 - 1.2 mg/dL Final   Bilirubin Total  Date Value Ref Range Status  05/16/2021 0.2 0.0 - 1.2 mg/dL Final   Protein, ur  Date Value Ref Range Status  12/17/2020 NEGATIVE NEGATIVE mg/dL Final   Total Protein  Date Value Ref Range Status  08/01/2021 8.1 6.5 - 8.1 g/dL Final  05/16/2021 6.7 6.0 - 8.5 g/dL Final   GFR calc Af Amer  Date Value Ref Range Status  01/29/2020 >60 >60 mL/min Final   GFR  Date Value Ref Range Status  05/25/2017 75.12 >60.00 mL/min Final   eGFR  Date Value Ref Range Status  11/11/2021 101 >59 mL/min/1.73 Final   GFR, Estimated  Date Value Ref Range Status  08/01/2021 >60 >60 mL/min Final    Comment:    (NOTE) Calculated using the CKD-EPI Creatinine Equation (2021)             Requested Prescriptions  Pending Prescriptions Disp Refills   gabapentin (NEURONTIN) 600 MG tablet 120 tablet 3    Sig: Take 2 tablets (1,200 mg total) by mouth 2 (two) times daily.     Neurology: Anticonvulsants - gabapentin Passed - 11/24/2021  8:06 AM      Passed - Cr in normal range and within 360 days    Creatinine, Ser  Date Value Ref Range Status  11/11/2021 0.73 0.57 - 1.00 mg/dL Final          Passed - Completed PHQ-2 or PHQ-9 in the last 360 days      Passed - Valid encounter within last 12 months    Recent Outpatient Visits           1 month ago Essential hypertension   [place], MD   6 months ago Essential hypertension   [place], MD       Future Appointments             In 3 weeks Ladell Pier, MD [place]   In 2 months Joya Gaskins Burnett Harry, MD [place]             mirtazapine (REMERON) 30 MG tablet 30  tablet 3    Sig: Take 1 tablet (30 mg total) by mouth at bedtime.     Psychiatry: Antidepressants - mirtazapine Passed - 11/24/2021  8:06 AM      Passed - Completed PHQ-2 or PHQ-9 in the last 360 days      Passed - Valid encounter within last 6 months    Recent Outpatient Visits           1 month ago Essential hypertension   [place], MD   6 months ago Essential hypertension   [place], MD       Future Appointments             In 3 weeks Ladell Pier, MD [place]   In 2 months Elsie Stain, MD [place]             OLANZapine (ZYPREXA) 2.5 MG tablet 30 tablet 3    Sig: Take 1 tablet (2.5 mg total) by mouth at bedtime as needed (insomnia/sleep).     Not Delegated - Psychiatry:  Antipsychotics - Second Generation (Atypical) - olanzapine Failed - 11/24/2021  8:06 AM      Failed - This refill cannot be delegated      Failed - Lipid Panel in normal range within the last 12 months    Cholesterol, Total  Date Value Ref Range Status  05/16/2021 183 100 - 199 mg/dL Final   LDL Chol Calc (NIH)  Date Value Ref Range Status  05/16/2021 69 0 - 99 mg/dL Final   HDL  Date Value Ref Range Status  05/16/2021 94 >39 mg/dL Final   Triglycerides  Date Value Ref Range Status  05/16/2021 118 0 - 149 mg/dL Final         Passed - TSH in normal range and within 360 days    TSH  Date Value Ref Range Status   05/16/2021 1.300 0.450 - 4.500 uIU/mL Final          Passed - Completed PHQ-2 or PHQ-9 in the last 360 days      Passed - Last BP in normal range    BP Readings from Last 1 Encounters:  11/24/21 104/89          Passed - Last Heart Rate in normal range    Pulse Readings from Last 1 Encounters:  11/11/21 90          Passed - Valid encounter within last 6 months    Recent Outpatient Visits           1 month ago Essential hypertension   [place], Patrick E, MD   6 months ago Essential hypertension   [place], MD       Future Appointments             In 3 weeks Ladell Pier, MD [place]   In 2 months Elsie Stain, MD [place] - CBC within normal limits and completed in the last 12 months    WBC  Date Value Ref Range Status  08/01/2021 9.0 4.0 - 10.5 K/uL Final   RBC  Date Value Ref Range Status  08/01/2021 4.40 3.87 -  5.11 MIL/uL Final   Hemoglobin  Date Value Ref Range Status  08/01/2021 13.5 12.0 - 15.0 g/dL Final  05/16/2021 13.4 11.1 - 15.9 g/dL Final   HCT  Date Value Ref Range Status  08/01/2021 40.1 36.0 - 46.0 % Final   Hematocrit  Date Value Ref Range Status  05/16/2021 39.7 34.0 - 46.6 % Final   MCHC  Date Value Ref Range Status  08/01/2021 33.7 30.0 - 36.0 g/dL Final   [facility]  Date Value Ref Range Status  08/01/2021 30.7 26.0 - 34.0 pg Final   MCV  Date Value Ref Range Status  08/01/2021 91.1 80.0 - 100.0 fL Final  05/16/2021 89 79 - 97 fL Final   No results found for: PLTCOUNTKUC, LABPLAT, POCPLA RDW  Date Value Ref Range Status  08/01/2021 13.9 11.5 - 15.5 % Final  05/16/2021 13.4 11.7 - 15.4 % Final         Passed - CMP within normal limits and completed in the last 12 months    Albumin  Date Value Ref Range Status  08/01/2021 4.3 3.5 - 5.0  g/dL Final  05/16/2021 4.3 3.8 - 4.8 g/dL Final   Alkaline Phosphatase  Date Value Ref Range Status  08/01/2021 88 38 - 126 U/L Final   ALT  Date Value Ref Range Status  08/01/2021 33 0 - 44 U/L Final   AST  Date Value Ref Range Status  08/01/2021 24 15 - 41 U/L Final   BUN  Date Value Ref Range Status  11/11/2021 10 6 - 24 mg/dL Final   Calcium  Date Value Ref Range Status  11/11/2021 10.3 (H) 8.7 - 10.2 mg/dL Final   Calcium, Ion  Date Value Ref Range Status  08/04/2020 1.25 1.15 - 1.40 mmol/L Final   CO2  Date Value Ref Range Status  11/11/2021 22 20 - 29 mmol/L Final   Bicarbonate  Date Value Ref Range Status  12/18/2020 22.9 20.0 - 28.0 mmol/L Final   TCO2  Date Value Ref Range Status  08/04/2020 25 22 - 32 mmol/L Final   Creatinine, Ser  Date Value Ref Range Status  11/11/2021 0.73 0.57 - 1.00 mg/dL Final   Glucose  Date Value Ref Range Status  11/11/2021 118 (H) 70 - 99 mg/dL Final   Glucose, Bld  Date Value Ref Range Status  08/01/2021 83 70 - 99 mg/dL Final    Comment:    Glucose reference range applies only to samples taken after fasting for at least 8 hours.   Glucose-Capillary  Date Value Ref Range Status  12/24/2020 101 (H) 70 - 99 mg/dL Final    Comment:    Glucose reference range applies only to samples taken after fasting for at least 8 hours.   Potassium  Date Value Ref Range Status  11/11/2021 4.5 3.5 - 5.2 mmol/L Final   Sodium  Date Value Ref Range Status  11/11/2021 136 134 - 144 mmol/L Final   Total Bilirubin  Date Value Ref Range Status  08/01/2021 0.4 0.3 - 1.2 mg/dL Final   Bilirubin Total  Date Value Ref Range Status  05/16/2021 0.2 0.0 - 1.2 mg/dL Final   Protein, ur  Date Value Ref Range Status  12/17/2020 NEGATIVE NEGATIVE mg/dL Final   Total Protein  Date Value Ref Range Status  08/01/2021 8.1 6.5 - 8.1 g/dL Final  05/16/2021 6.7 6.0 - 8.5 g/dL Final   GFR calc Af Amer  Date Value Ref Range Status   01/29/2020 >  60 >60 mL/min Final   GFR  Date Value Ref Range Status  05/25/2017 75.12 >60.00 mL/min Final   eGFR  Date Value Ref Range Status  11/11/2021 101 >59 mL/min/1.73 Final   GFR, Estimated  Date Value Ref Range Status  08/01/2021 >60 >60 mL/min Final    Comment:    (NOTE) Calculated using the CKD-EPI Creatinine Equation (2021)

## 2021-11-24 NOTE — Telephone Encounter (Signed)
Requested medication (s) are due for refill today - no- but may require change in pharmacy ? ?Requested medication (s) are on the active medication list -yes ? ?Future visit scheduled -yes ? ?Last refill: 11/02/21 ? ?Notes to clinic: Request RF: Rx may require different pharmacy- not sure how congregational nurse funds are addressed with pharmacy- also request for 90 tablets to cover 30 day supply ? ?Requested Prescriptions  ?Pending Prescriptions Disp Refills  ? desvenlafaxine (PRISTIQ) 25 MG 24 hr tablet 60 tablet 3  ?  Sig: Take 3 tablets (75 mg total) by mouth daily.  ?  ? Psychiatry: Antidepressants - SNRI - desvenlafaxine & venlafaxine Failed - 11/24/2021  9:55 AM  ?  ?  Failed - Lipid Panel in normal range within the last 12 months  ?  Cholesterol, Total  ?Date Value Ref Range Status  ?05/16/2021 183 100 - 199 mg/dL Final  ? ?LDL Chol Calc (NIH)  ?Date Value Ref Range Status  ?05/16/2021 69 0 - 99 mg/dL Final  ? ?HDL  ?Date Value Ref Range Status  ?05/16/2021 94 >39 mg/dL Final  ? ?Triglycerides  ?Date Value Ref Range Status  ?05/16/2021 118 0 - 149 mg/dL Final  ? ?  ?  ?  Passed - Cr in normal range and within 360 days  ?  Creatinine, Ser  ?Date Value Ref Range Status  ?11/11/2021 0.73 0.57 - 1.00 mg/dL Final  ?  ?  ?  ?  Passed - Completed PHQ-2 or PHQ-9 in the last 360 days  ?  ?  Passed - Last BP in normal range  ?  BP Readings from Last 1 Encounters:  ?11/24/21 104/89  ?  ?  ?  ?  Passed - Valid encounter within last 6 months  ?  Recent Outpatient Visits   ? ?      ? 1 month ago Essential hypertension  ? [place] Elsie Stain, MD  ? 6 months ago Essential hypertension  ? [place] Elsie Stain, MD  ? ?  ?  ?Future Appointments   ? ?        ? In 3 weeks Ladell Pier, MD [place]  ? In 2 months Elsie Stain, MD [place]  ? ?  ? ?  ?  ?  ? ? ? ?Requested  Prescriptions  ?Pending Prescriptions Disp Refills  ? desvenlafaxine (PRISTIQ) 25 MG 24 hr tablet 60 tablet 3  ?  Sig: Take 3 tablets (75 mg total) by mouth daily.  ?  ? Psychiatry: Antidepressants - SNRI - desvenlafaxine & venlafaxine Failed - 11/24/2021  9:55 AM  ?  ?  Failed - Lipid Panel in normal range within the last 12 months  ?  Cholesterol, Total  ?Date Value Ref Range Status  ?05/16/2021 183 100 - 199 mg/dL Final  ? ?LDL Chol Calc (NIH)  ?Date Value Ref Range Status  ?05/16/2021 69 0 - 99 mg/dL Final  ? ?HDL  ?Date Value Ref Range Status  ?05/16/2021 94 >39 mg/dL Final  ? ?Triglycerides  ?Date Value Ref Range Status  ?05/16/2021 118 0 - 149 mg/dL Final  ? ?  ?  ?  Passed - Cr in normal range and within 360 days  ?  Creatinine, Ser  ?Date Value Ref Range Status  ?11/11/2021 0.73 0.57 - 1.00 mg/dL Final  ?  ?  ?  ?  Passed - Completed PHQ-2 or PHQ-9 in the last 360 days  ?  ?  Passed - Last BP in normal range  ?  BP Readings from Last 1 Encounters:  ?11/24/21 104/89  ?  ?  ?  ?  Passed - Valid encounter within last 6 months  ?  Recent Outpatient Visits   ? ?      ? 1 month ago Essential hypertension  ? [place] Elsie Stain, MD  ? 6 months ago Essential hypertension  ? [place] Elsie Stain, MD  ? ?  ?  ?Future Appointments   ? ?        ? In 3 weeks Ladell Pier, MD [place]  ? In 2 months Elsie Stain, MD [place]  ? ?  ? ?  ?  ?  ? ? ? ?

## 2021-11-25 ENCOUNTER — Other Ambulatory Visit: Payer: Self-pay | Admitting: Critical Care Medicine

## 2021-11-25 ENCOUNTER — Other Ambulatory Visit (HOSPITAL_COMMUNITY): Payer: Self-pay

## 2021-11-25 ENCOUNTER — Other Ambulatory Visit: Payer: Self-pay | Admitting: Psychiatry

## 2021-11-25 ENCOUNTER — Telehealth: Payer: Self-pay | Admitting: Critical Care Medicine

## 2021-11-25 DIAGNOSIS — F4310 Post-traumatic stress disorder, unspecified: Secondary | ICD-10-CM

## 2021-11-25 DIAGNOSIS — F3341 Major depressive disorder, recurrent, in partial remission: Secondary | ICD-10-CM

## 2021-11-25 DIAGNOSIS — E782 Mixed hyperlipidemia: Secondary | ICD-10-CM

## 2021-11-25 MED ORDER — DESVENLAFAXINE SUCCINATE ER 25 MG PO TB24
75.0000 mg | ORAL_TABLET | Freq: Every day | ORAL | 3 refills | Status: DC
  Filled 2021-11-25: qty 60, 20d supply, fill #0

## 2021-11-25 MED ORDER — MIRTAZAPINE 30 MG PO TABS
30.0000 mg | ORAL_TABLET | Freq: Every day | ORAL | 3 refills | Status: DC
  Filled 2021-11-25: qty 30, 30d supply, fill #0

## 2021-11-25 MED ORDER — GABAPENTIN 600 MG PO TABS
1200.0000 mg | ORAL_TABLET | Freq: Two times a day (BID) | ORAL | 3 refills | Status: DC
  Filled 2021-11-25: qty 120, 30d supply, fill #0

## 2021-11-25 MED ORDER — OLANZAPINE 2.5 MG PO TABS
2.5000 mg | ORAL_TABLET | Freq: Every evening | ORAL | 3 refills | Status: DC | PRN
  Filled 2021-11-25 – 2021-11-28 (×2): qty 30, 30d supply, fill #0

## 2021-11-25 NOTE — Telephone Encounter (Signed)
Pt is calling back to report that she will run out of medication tomorrow. ?

## 2021-11-25 NOTE — Telephone Encounter (Signed)
Requested medication (s) are due for refill today - no ? ?Requested medication (s) are on the active medication list -yes ? ?Future visit scheduled -yes ? ?Last refill: today ? ?Notes to clinic: Pharmacy request review of Rx- request change number to 30 day supply- sent for review  ? ?Requested Prescriptions  ?Pending Prescriptions Disp Refills  ? desvenlafaxine (PRISTIQ) 25 MG 24 hr tablet 60 tablet 3  ?  Sig: Take 3 tablets (75 mg total) by mouth daily.  ?  ? Psychiatry: Antidepressants - SNRI - desvenlafaxine & venlafaxine Failed - 11/25/2021  1:20 PM  ?  ?  Failed - Lipid Panel in normal range within the last 12 months  ?  Cholesterol, Total  ?Date Value Ref Range Status  ?05/16/2021 183 100 - 199 mg/dL Final  ? ?LDL Chol Calc (NIH)  ?Date Value Ref Range Status  ?05/16/2021 69 0 - 99 mg/dL Final  ? ?HDL  ?Date Value Ref Range Status  ?05/16/2021 94 >39 mg/dL Final  ? ?Triglycerides  ?Date Value Ref Range Status  ?05/16/2021 118 0 - 149 mg/dL Final  ? ?  ?  ?  Passed - Cr in normal range and within 360 days  ?  Creatinine, Ser  ?Date Value Ref Range Status  ?11/11/2021 0.73 0.57 - 1.00 mg/dL Final  ?  ?  ?  ?  Passed - Completed PHQ-2 or PHQ-9 in the last 360 days  ?  ?  Passed - Last BP in normal range  ?  BP Readings from Last 1 Encounters:  ?11/24/21 104/89  ?  ?  ?  ?  Passed - Valid encounter within last 6 months  ?  Recent Outpatient Visits   ? ?      ? 1 month ago Essential hypertension  ? [facility] Storm Frisk, MD  ? 6 months ago Essential hypertension  ? [facility] Storm Frisk, MD  ? ?  ?  ?Future Appointments   ? ?        ? In 2 weeks Marcine Matar, MD [facility]  ? In 1 month [place] Charlcie Cradle, MD [facility]  ? ?  ? ?  ?  ?  ? ? ? ?Requested Prescriptions  ?Pending Prescriptions Disp Refills  ? desvenlafaxine (PRISTIQ) 25 MG 24 hr tablet 60 tablet 3  ?  Sig: Take  3 tablets (75 mg total) by mouth daily.  ?  ? Psychiatry: Antidepressants - SNRI - desvenlafaxine & venlafaxine Failed - 11/25/2021  1:20 PM  ?  ?  Failed - Lipid Panel in normal range within the last 12 months  ?  Cholesterol, Total  ?Date Value Ref Range Status  ?05/16/2021 183 100 - 199 mg/dL Final  ? ?LDL Chol Calc (NIH)  ?Date Value Ref Range Status  ?05/16/2021 69 0 - 99 mg/dL Final  ? ?HDL  ?Date Value Ref Range Status  ?05/16/2021 94 >39 mg/dL Final  ? ?Triglycerides  ?Date Value Ref Range Status  ?05/16/2021 118 0 - 149 mg/dL Final  ? ?  ?  ?  Passed - Cr in normal range and within 360 days  ?  Creatinine, Ser  ?Date Value Ref Range Status  ?11/11/2021 0.73 0.57 - 1.00 mg/dL Final  ?  ?  ?  ?  Passed - Completed PHQ-2 or PHQ-9 in the last 360 days  ?  ?  Passed - Last BP in normal range  ?  BP Readings from Last 1 Encounters:  ?11/24/21 104/89  ?  ?  ?  ?  Passed - Valid encounter within last 6 months  ?  Recent Outpatient Visits   ? ?      ? 1 month ago Essential hypertension  ? [facility] Storm Frisk, MD  ? 6 months ago Essential hypertension  ? [facility] Storm Frisk, MD  ? ?  ?  ?Future Appointments   ? ?        ? In 2 weeks Marcine Matar, MD [facility]  ? In 1 month [place] Charlcie Cradle, MD [facility]  ? ?  ? ?  ?  ?  ? ? ? ?

## 2021-11-25 NOTE — Telephone Encounter (Signed)
Requested medication (s) are due for refill today: no  Requested medication (s) are on the active medication list: yes  Last refill:  11/02/21 #30/3  Future visit scheduled: yes  Notes to clinic:  rx needs to be sent to different pharmacy d/t pt lost job and is able to get it free x 2 months     Requested Prescriptions  Pending Prescriptions Disp Refills   OLANZapine (ZYPREXA) 2.5 MG tablet 30 tablet 3    Sig: Take 1 tablet (2.5 mg total) by mouth at bedtime as needed (insomnia/sleep).     Not Delegated - Psychiatry:  Antipsychotics - Second Generation (Atypical) - olanzapine Failed - 11/25/2021 11:11 AM      Failed - This refill cannot be delegated      Failed - Lipid Panel in normal range within the last 12 months    Cholesterol, Total  Date Value Ref Range Status  05/16/2021 183 100 - 199 mg/dL Final   LDL Chol Calc (NIH)  Date Value Ref Range Status  05/16/2021 69 0 - 99 mg/dL Final   HDL  Date Value Ref Range Status  05/16/2021 94 >39 mg/dL Final   Triglycerides  Date Value Ref Range Status  05/16/2021 118 0 - 149 mg/dL Final         Passed - TSH in normal range and within 360 days    TSH  Date Value Ref Range Status  05/16/2021 1.300 0.450 - 4.500 uIU/mL Final          Passed - Completed PHQ-2 or PHQ-9 in the last 360 days      Passed - Last BP in normal range    BP Readings from Last 1 Encounters:  11/24/21 104/89          Passed - Last Heart Rate in normal range    Pulse Readings from Last 1 Encounters:  11/11/21 90          Passed - Valid encounter within last 6 months    Recent Outpatient Visits           1 month ago Essential hypertension   [place], Patrick E, MD   6 months ago Essential hypertension   [place], MD       Future Appointments             In 2 weeks Ladell Pier, MD [place]   In 1 month  Elsie Stain, MD [place] - CBC within normal limits and completed in the last 12 months    WBC  Date Value Ref Range Status  08/01/2021 9.0 4.0 - 10.5 K/uL Final   RBC  Date Value Ref Range Status  08/01/2021 4.40 3.87 - 5.11 MIL/uL Final   Hemoglobin  Date Value Ref Range Status  08/01/2021 13.5 12.0 - 15.0 g/dL Final  05/16/2021 13.4 11.1 - 15.9 g/dL Final   HCT  Date Value Ref Range Status  08/01/2021 40.1 36.0 - 46.0 % Final   Hematocrit  Date Value Ref Range Status  05/16/2021 39.7 34.0 - 46.6 % Final   MCHC  Date Value Ref Range Status  08/01/2021 33.7 30.0 - 36.0 g/dL Final   [facility]  Date Value Ref Range Status  08/01/2021 30.7 26.0 - 34.0 pg Final   MCV  Date Value Ref Range Status  08/01/2021 91.1 80.0 - 100.0 fL Final  05/16/2021 89 79 - 97 fL Final   No results found for: PLTCOUNTKUC, LABPLAT, POCPLA RDW  Date Value Ref Range Status  08/01/2021 13.9 11.5 - 15.5 % Final  05/16/2021 13.4 11.7 - 15.4 % Final         Passed - CMP within normal limits and completed in the last 12 months    Albumin  Date Value Ref Range Status  08/01/2021 4.3 3.5 - 5.0 g/dL Final  05/16/2021 4.3 3.8 - 4.8 g/dL Final   Alkaline Phosphatase  Date Value Ref Range Status  08/01/2021 88 38 - 126 U/L Final   ALT  Date Value Ref Range Status  08/01/2021 33 0 - 44 U/L Final   AST  Date Value Ref Range Status  08/01/2021 24 15 - 41 U/L Final   BUN  Date Value Ref Range Status  11/11/2021 10 6 - 24 mg/dL Final   Calcium  Date Value Ref Range Status  11/11/2021 10.3 (H) 8.7 - 10.2 mg/dL Final   Calcium, Ion  Date Value Ref Range Status  08/04/2020 1.25 1.15 - 1.40 mmol/L Final   CO2  Date Value Ref Range Status  11/11/2021 22 20 - 29 mmol/L Final   Bicarbonate  Date Value Ref Range Status  12/18/2020 22.9 20.0 - 28.0 mmol/L Final   TCO2  Date Value Ref Range Status  08/04/2020 25 22 - 32 mmol/L  Final   Creatinine, Ser  Date Value Ref Range Status  11/11/2021 0.73 0.57 - 1.00 mg/dL Final   Glucose  Date Value Ref Range Status  11/11/2021 118 (H) 70 - 99 mg/dL Final   Glucose, Bld  Date Value Ref Range Status  08/01/2021 83 70 - 99 mg/dL Final    Comment:    Glucose reference range applies only to samples taken after fasting for at least 8 hours.   Glucose-Capillary  Date Value Ref Range Status  12/24/2020 101 (H) 70 - 99 mg/dL Final    Comment:    Glucose reference range applies only to samples taken after fasting for at least 8 hours.   Potassium  Date Value Ref Range Status  11/11/2021 4.5 3.5 - 5.2 mmol/L Final   Sodium  Date Value Ref Range Status  11/11/2021 136 134 - 144 mmol/L Final   Total Bilirubin  Date Value Ref Range Status  08/01/2021 0.4 0.3 - 1.2 mg/dL Final   Bilirubin Total  Date Value Ref Range Status  05/16/2021 0.2 0.0 - 1.2 mg/dL Final   Protein, ur  Date Value Ref Range Status  12/17/2020 NEGATIVE NEGATIVE mg/dL Final   Total Protein  Date Value Ref Range Status  08/01/2021 8.1 6.5 - 8.1 g/dL Final  05/16/2021 6.7 6.0 - 8.5 g/dL Final   GFR calc Af Amer  Date Value Ref Range Status  01/29/2020 >60 >60 mL/min Final   GFR  Date Value Ref Range Status  05/25/2017 75.12 >60.00 mL/min Final   eGFR  Date Value Ref Range Status  11/11/2021 101 >59 mL/min/1.73 Final   GFR, Estimated  Date Value Ref Range Status  08/01/2021 >60 >60 mL/min Final    Comment:    (NOTE) Calculated using the CKD-EPI Creatinine Equation (2021)          Signed Prescriptions Disp Refills   gabapentin (NEURONTIN) 600 MG tablet 120 tablet 3    Sig: Take 2 tablets (1,200 mg total) by mouth 2 (  two) times daily.     Neurology: Anticonvulsants - gabapentin Passed - 11/25/2021 11:11 AM      Passed - Cr in normal range and within 360 days    Creatinine, Ser  Date Value Ref Range Status  11/11/2021 0.73 0.57 - 1.00 mg/dL Final          Passed -  Completed PHQ-2 or PHQ-9 in the last 360 days      Passed - Valid encounter within last 12 months    Recent Outpatient Visits           1 month ago Essential hypertension   [place], MD   6 months ago Essential hypertension   [place], MD       Future Appointments             In 2 weeks Ladell Pier, MD [place]   In 1 month Elsie Stain, MD [place]             mirtazapine (REMERON) 30 MG tablet 30 tablet 3    Sig: Take 1 tablet (30 mg total) by mouth at bedtime.     Psychiatry: Antidepressants - mirtazapine Passed - 11/25/2021 11:11 AM      Passed - Completed PHQ-2 or PHQ-9 in the last 360 days      Passed - Valid encounter within last 6 months    Recent Outpatient Visits           1 month ago Essential hypertension   [place] Elsie Stain, MD   6 months ago Essential hypertension   [place], MD       Future Appointments             In 2 weeks Ladell Pier, MD [place]   In 1 month Elsie Stain, MD [place]             desvenlafaxine (PRISTIQ) 25 MG 24 hr tablet 60 tablet 3    Sig: Take 3 tablets (75 mg total) by mouth daily.     Psychiatry: Antidepressants - SNRI - desvenlafaxine & venlafaxine Failed - 11/25/2021 11:11 AM      Failed - Lipid Panel in normal range within the last 12 months    Cholesterol, Total  Date Value Ref Range Status  05/16/2021 183 100 - 199 mg/dL Final   LDL Chol Calc (NIH)  Date Value Ref Range Status  05/16/2021 69 0 - 99 mg/dL Final   HDL  Date Value Ref Range Status  05/16/2021 94 >39 mg/dL Final   Triglycerides  Date Value Ref Range Status  05/16/2021 118 0 - 149 mg/dL Final          Passed - Cr in normal range and within 360 days    Creatinine, Ser  Date Value Ref Range Status  11/11/2021 0.73 0.57 - 1.00 mg/dL Final          Passed - Completed PHQ-2 or PHQ-9 in the last 360 days      Passed - Last BP in normal range    BP Readings from Last 1 Encounters:  11/24/21 104/89          Passed - Valid encounter within last 6 months  Recent Outpatient Visits           1 month ago Essential hypertension   [place], MD   6 months ago Essential hypertension   [place], MD       Future Appointments             In 2 weeks Ladell Pier, MD [place]   In 1 month Joya Gaskins Burnett Harry, MD [place]

## 2021-11-25 NOTE — Telephone Encounter (Addendum)
Pt is calling to report last month when the medication was called in there was an error with the quantity of the desvenlafaxine (PRISTIQ) 25 MG 24 hr tablet [id number] . 60 where called in pt reports 90 should have been called in. Preferred Pharmacy [facility] ? ?

## 2021-11-25 NOTE — Telephone Encounter (Signed)
Sending to different pharmacy  Requested Prescriptions  Pending Prescriptions Disp Refills   gabapentin (NEURONTIN) 600 MG tablet 120 tablet 3    Sig: Take 2 tablets (1,200 mg total) by mouth 2 (two) times daily.     Neurology: Anticonvulsants - gabapentin Passed - 11/25/2021 11:11 AM      Passed - Cr in normal range and within 360 days    Creatinine, Ser  Date Value Ref Range Status  11/11/2021 0.73 0.57 - 1.00 mg/dL Final         Passed - Completed PHQ-2 or PHQ-9 in the last 360 days      Passed - Valid encounter within last 12 months    Recent Outpatient Visits          1 month ago Essential hypertension   [place], MD   6 months ago Essential hypertension   [place], MD      Future Appointments            In 2 weeks Ladell Pier, MD [place]   In 1 month Elsie Stain, MD [place]            mirtazapine (REMERON) 30 MG tablet 30 tablet 3    Sig: Take 1 tablet (30 mg total) by mouth at bedtime.     Psychiatry: Antidepressants - mirtazapine Passed - 11/25/2021 11:11 AM      Passed - Completed PHQ-2 or PHQ-9 in the last 360 days      Passed - Valid encounter within last 6 months    Recent Outpatient Visits          1 month ago Essential hypertension   [place] Elsie Stain, MD   6 months ago Essential hypertension   [place], MD      Future Appointments            In 2 weeks Ladell Pier, MD [place]   In 1 month Elsie Stain, MD [place]            desvenlafaxine (PRISTIQ) 25 MG 24 hr tablet 60 tablet 3    Sig: Take 3 tablets (75 mg total) by mouth daily.     Psychiatry: Antidepressants - SNRI - desvenlafaxine &  venlafaxine Failed - 11/25/2021 11:11 AM      Failed - Lipid Panel in normal range within the last 12 months    Cholesterol, Total  Date Value Ref Range Status  05/16/2021 183 100 - 199 mg/dL Final   LDL Chol Calc (NIH)  Date Value Ref Range Status  05/16/2021 69 0 - 99 mg/dL Final   HDL  Date Value Ref Range Status  05/16/2021 94 >39 mg/dL Final   Triglycerides  Date Value Ref Range Status  05/16/2021 118 0 - 149 mg/dL Final         Passed - Cr in normal range and within 360 days    Creatinine, Ser  Date Value Ref Range Status  11/11/2021 0.73 0.57 - 1.00 mg/dL Final         Passed - Completed PHQ-2 or PHQ-9 in the last 360 days      Passed - Last BP in normal range    BP Readings from  Last 1 Encounters:  11/24/21 104/89         Passed - Valid encounter within last 6 months    Recent Outpatient Visits          1 month ago Essential hypertension   [place], MD   6 months ago Essential hypertension   [place], MD      Future Appointments            In 2 weeks Ladell Pier, MD [place]   In 1 month Elsie Stain, MD [place]            OLANZapine (ZYPREXA) 2.5 MG tablet 30 tablet 3    Sig: Take 1 tablet (2.5 mg total) by mouth at bedtime as needed (insomnia/sleep).     Not Delegated - Psychiatry:  Antipsychotics - Second Generation (Atypical) - olanzapine Failed - 11/25/2021 11:11 AM      Failed - This refill cannot be delegated      Failed - Lipid Panel in normal range within the last 12 months    Cholesterol, Total  Date Value Ref Range Status  05/16/2021 183 100 - 199 mg/dL Final   LDL Chol Calc (NIH)  Date Value Ref Range Status  05/16/2021 69 0 - 99 mg/dL Final   HDL  Date Value Ref Range Status  05/16/2021 94 >39 mg/dL Final   Triglycerides  Date Value Ref Range Status   05/16/2021 118 0 - 149 mg/dL Final         Passed - TSH in normal range and within 360 days    TSH  Date Value Ref Range Status  05/16/2021 1.300 0.450 - 4.500 uIU/mL Final         Passed - Completed PHQ-2 or PHQ-9 in the last 360 days      Passed - Last BP in normal range    BP Readings from Last 1 Encounters:  11/24/21 104/89         Passed - Last Heart Rate in normal range    Pulse Readings from Last 1 Encounters:  11/11/21 90         Passed - Valid encounter within last 6 months    Recent Outpatient Visits          1 month ago Essential hypertension   [place], Patrick E, MD   6 months ago Essential hypertension   [place], MD      Future Appointments            In 2 weeks Ladell Pier, MD [place]   In 1 month Elsie Stain, MD [place] - CBC within normal limits and completed in the last 12 months    WBC  Date Value Ref Range Status  08/01/2021 9.0 4.0 - 10.5 K/uL Final   RBC  Date Value Ref Range Status  08/01/2021 4.40 3.87 - 5.11 MIL/uL Final   Hemoglobin  Date Value Ref Range Status  08/01/2021 13.5 12.0 - 15.0 g/dL Final  05/16/2021 13.4 11.1 - 15.9 g/dL Final   HCT  Date Value Ref Range Status  08/01/2021 40.1 36.0 - 46.0 % Final   Hematocrit  Date Value Ref Range Status  05/16/2021 39.7 34.0 - 46.6 % Final   MCHC  Date Value Ref Range Status  08/01/2021 33.7 30.0 - 36.0 g/dL Final   [facility]  Date Value Ref Range Status  08/01/2021 30.7 26.0 - 34.0 pg Final   MCV  Date Value Ref Range Status  08/01/2021 91.1 80.0 - 100.0 fL Final  05/16/2021 89 79 - 97 fL Final   No results found for: PLTCOUNTKUC, LABPLAT, POCPLA RDW  Date Value Ref Range Status  08/01/2021 13.9 11.5 - 15.5 % Final  05/16/2021 13.4 11.7 - 15.4 % Final         Passed - CMP within  normal limits and completed in the last 12 months    Albumin  Date Value Ref Range Status  08/01/2021 4.3 3.5 - 5.0 g/dL Final  05/16/2021 4.3 3.8 - 4.8 g/dL Final   Alkaline Phosphatase  Date Value Ref Range Status  08/01/2021 88 38 - 126 U/L Final   ALT  Date Value Ref Range Status  08/01/2021 33 0 - 44 U/L Final   AST  Date Value Ref Range Status  08/01/2021 24 15 - 41 U/L Final   BUN  Date Value Ref Range Status  11/11/2021 10 6 - 24 mg/dL Final   Calcium  Date Value Ref Range Status  11/11/2021 10.3 (H) 8.7 - 10.2 mg/dL Final   Calcium, Ion  Date Value Ref Range Status  08/04/2020 1.25 1.15 - 1.40 mmol/L Final   CO2  Date Value Ref Range Status  11/11/2021 22 20 - 29 mmol/L Final   Bicarbonate  Date Value Ref Range Status  12/18/2020 22.9 20.0 - 28.0 mmol/L Final   TCO2  Date Value Ref Range Status  08/04/2020 25 22 - 32 mmol/L Final   Creatinine, Ser  Date Value Ref Range Status  11/11/2021 0.73 0.57 - 1.00 mg/dL Final   Glucose  Date Value Ref Range Status  11/11/2021 118 (H) 70 - 99 mg/dL Final   Glucose, Bld  Date Value Ref Range Status  08/01/2021 83 70 - 99 mg/dL Final    Comment:    Glucose reference range applies only to samples taken after fasting for at least 8 hours.   Glucose-Capillary  Date Value Ref Range Status  12/24/2020 101 (H) 70 - 99 mg/dL Final    Comment:    Glucose reference range applies only to samples taken after fasting for at least 8 hours.   Potassium  Date Value Ref Range Status  11/11/2021 4.5 3.5 - 5.2 mmol/L Final   Sodium  Date Value Ref Range Status  11/11/2021 136 134 - 144 mmol/L Final   Total Bilirubin  Date Value Ref Range Status  08/01/2021 0.4 0.3 - 1.2 mg/dL Final   Bilirubin Total  Date Value Ref Range Status  05/16/2021 0.2 0.0 - 1.2 mg/dL Final   Protein, ur  Date Value Ref Range Status  12/17/2020 NEGATIVE NEGATIVE mg/dL Final   Total Protein  Date Value Ref Range Status  08/01/2021  8.1 6.5 - 8.1 g/dL Final  05/16/2021 6.7 6.0 - 8.5 g/dL Final   GFR calc Af Amer  Date Value Ref Range Status  01/29/2020 >60 >60 mL/min Final   GFR  Date Value Ref Range Status  05/25/2017 75.12 >60.00 mL/min Final   eGFR  Date Value Ref Range Status  11/11/2021 101 >59 mL/min/1.73 Final   GFR, Estimated  Date Value Ref Range Status  08/01/2021 >60 >60 mL/min  Final    Comment:    (NOTE) Calculated using the CKD-EPI Creatinine Equation (2021)

## 2021-11-25 NOTE — Telephone Encounter (Signed)
Copied from CRM [id number]. Topic: Quick Communication - Rx Refill/Question ?>> Nov 25, 2021 10:23 AM Marylen Ponto wrote: ?Pt stated she lost her job and Dr. Delford Field approved for her to get her medications free for 2 months but the Rx is at the wrong pharmacy. ? ?Medication: desvenlafaxine (PRISTIQ) 25 MG 24 hr tablet, gabapentin (NEURONTIN) 600 MG tablet, mirtazapine (REMERON) 30 MG tablet, and OLANZapine (ZYPREXA) 2.5 MG tablet ? ?Has the patient contacted their pharmacy? Yes.  Pt told to contact provider ?(Agent: If no, request that the patient contact the pharmacy for the refill. If patient does not wish to contact the pharmacy document the reason why and proceed with request.) ?(Agent: If yes, when and what did the pharmacy advise?) ? ?Preferred Pharmacy (with phone number or street name): [facility]  ?Phone:  [phone] ?Fax:  [phone] ? ?Has the patient been seen for an appointment in the last year OR does the patient have an upcoming appointment? Yes.   ? ?Agent: Please be advised that RX refills may take up to 3 business days. We ask that you follow-up with your pharmacy. ?

## 2021-11-25 NOTE — Telephone Encounter (Signed)
Duplicate request- refused by office- prescriber not at this practice Requested Prescriptions  Pending Prescriptions Disp Refills   OLANZapine (ZYPREXA) 2.5 MG tablet 30 tablet 3    Sig: Take 1 tablet (2.5 mg total) by mouth at bedtime as needed (insomnia/sleep).     Not Delegated - Psychiatry:  Antipsychotics - Second Generation (Atypical) - olanzapine Failed - 11/25/2021 11:11 AM      Failed - This refill cannot be delegated      Failed - Lipid Panel in normal range within the last 12 months    Cholesterol, Total  Date Value Ref Range Status  05/16/2021 183 100 - 199 mg/dL Final   LDL Chol Calc (NIH)  Date Value Ref Range Status  05/16/2021 69 0 - 99 mg/dL Final   HDL  Date Value Ref Range Status  05/16/2021 94 >39 mg/dL Final   Triglycerides  Date Value Ref Range Status  05/16/2021 118 0 - 149 mg/dL Final         Passed - TSH in normal range and within 360 days    TSH  Date Value Ref Range Status  05/16/2021 1.300 0.450 - 4.500 uIU/mL Final         Passed - Completed PHQ-2 or PHQ-9 in the last 360 days      Passed - Last BP in normal range    BP Readings from Last 1 Encounters:  11/24/21 104/89         Passed - Last Heart Rate in normal range    Pulse Readings from Last 1 Encounters:  11/11/21 90         Passed - Valid encounter within last 6 months    Recent Outpatient Visits          1 month ago Essential hypertension   [place], Patrick E, MD   6 months ago Essential hypertension   [place], MD      Future Appointments            In 2 weeks Ladell Pier, MD [place]   In 1 month Elsie Stain, MD [place] - CBC within normal limits and completed in the last 12 months    WBC  Date Value Ref Range Status  08/01/2021 9.0 4.0 - 10.5 K/uL Final   RBC  Date Value Ref  Range Status  08/01/2021 4.40 3.87 - 5.11 MIL/uL Final   Hemoglobin  Date Value Ref Range Status  08/01/2021 13.5 12.0 - 15.0 g/dL Final  05/16/2021 13.4 11.1 - 15.9 g/dL Final   HCT  Date Value Ref Range Status  08/01/2021 40.1 36.0 - 46.0 % Final   Hematocrit  Date Value Ref Range Status  05/16/2021 39.7 34.0 - 46.6 % Final   MCHC  Date Value Ref Range Status  08/01/2021 33.7 30.0 - 36.0 g/dL Final   [facility]  Date Value Ref Range Status  08/01/2021 30.7 26.0 - 34.0 pg Final   MCV  Date Value Ref Range Status  08/01/2021 91.1 80.0 - 100.0 fL Final  05/16/2021 89 79 - 97 fL Final   No results found for: PLTCOUNTKUC, LABPLAT, POCPLA RDW  Date Value Ref Range Status  08/01/2021 13.9 11.5 - 15.5 % Final  05/16/2021 13.4 11.7 - 15.4 % Final  Passed - CMP within normal limits and completed in the last 12 months    Albumin  Date Value Ref Range Status  08/01/2021 4.3 3.5 - 5.0 g/dL Final  05/16/2021 4.3 3.8 - 4.8 g/dL Final   Alkaline Phosphatase  Date Value Ref Range Status  08/01/2021 88 38 - 126 U/L Final   ALT  Date Value Ref Range Status  08/01/2021 33 0 - 44 U/L Final   AST  Date Value Ref Range Status  08/01/2021 24 15 - 41 U/L Final   BUN  Date Value Ref Range Status  11/11/2021 10 6 - 24 mg/dL Final   Calcium  Date Value Ref Range Status  11/11/2021 10.3 (H) 8.7 - 10.2 mg/dL Final   Calcium, Ion  Date Value Ref Range Status  08/04/2020 1.25 1.15 - 1.40 mmol/L Final   CO2  Date Value Ref Range Status  11/11/2021 22 20 - 29 mmol/L Final   Bicarbonate  Date Value Ref Range Status  12/18/2020 22.9 20.0 - 28.0 mmol/L Final   TCO2  Date Value Ref Range Status  08/04/2020 25 22 - 32 mmol/L Final   Creatinine, Ser  Date Value Ref Range Status  11/11/2021 0.73 0.57 - 1.00 mg/dL Final   Glucose  Date Value Ref Range Status  11/11/2021 118 (H) 70 - 99 mg/dL Final   Glucose, Bld  Date Value Ref Range Status  08/01/2021 83 70 - 99  mg/dL Final    Comment:    Glucose reference range applies only to samples taken after fasting for at least 8 hours.   Glucose-Capillary  Date Value Ref Range Status  12/24/2020 101 (H) 70 - 99 mg/dL Final    Comment:    Glucose reference range applies only to samples taken after fasting for at least 8 hours.   Potassium  Date Value Ref Range Status  11/11/2021 4.5 3.5 - 5.2 mmol/L Final   Sodium  Date Value Ref Range Status  11/11/2021 136 134 - 144 mmol/L Final   Total Bilirubin  Date Value Ref Range Status  08/01/2021 0.4 0.3 - 1.2 mg/dL Final   Bilirubin Total  Date Value Ref Range Status  05/16/2021 0.2 0.0 - 1.2 mg/dL Final   Protein, ur  Date Value Ref Range Status  12/17/2020 NEGATIVE NEGATIVE mg/dL Final   Total Protein  Date Value Ref Range Status  08/01/2021 8.1 6.5 - 8.1 g/dL Final  05/16/2021 6.7 6.0 - 8.5 g/dL Final   GFR calc Af Amer  Date Value Ref Range Status  01/29/2020 >60 >60 mL/min Final   GFR  Date Value Ref Range Status  05/25/2017 75.12 >60.00 mL/min Final   eGFR  Date Value Ref Range Status  11/11/2021 101 >59 mL/min/1.73 Final   GFR, Estimated  Date Value Ref Range Status  08/01/2021 >60 >60 mL/min Final    Comment:    (NOTE) Calculated using the CKD-EPI Creatinine Equation (2021)          Signed Prescriptions Disp Refills   gabapentin (NEURONTIN) 600 MG tablet 120 tablet 3    Sig: Take 2 tablets (1,200 mg total) by mouth 2 (two) times daily.     Neurology: Anticonvulsants - gabapentin Passed - 11/25/2021 11:11 AM      Passed - Cr in normal range and within 360 days    Creatinine, Ser  Date Value Ref Range Status  11/11/2021 0.73 0.57 - 1.00 mg/dL Final         Passed - Completed  PHQ-2 or PHQ-9 in the last 360 days      Passed - Valid encounter within last 12 months    Recent Outpatient Visits          1 month ago Essential hypertension   [place], MD   6 months ago  Essential hypertension   [place], MD      Future Appointments            In 2 weeks Ladell Pier, MD [place]   In 1 month Elsie Stain, MD [place]            mirtazapine (REMERON) 30 MG tablet 30 tablet 3    Sig: Take 1 tablet (30 mg total) by mouth at bedtime.     Psychiatry: Antidepressants - mirtazapine Passed - 11/25/2021 11:11 AM      Passed - Completed PHQ-2 or PHQ-9 in the last 360 days      Passed - Valid encounter within last 6 months    Recent Outpatient Visits          1 month ago Essential hypertension   [place] Elsie Stain, MD   6 months ago Essential hypertension   [place], MD      Future Appointments            In 2 weeks Ladell Pier, MD [place]   In 1 month Elsie Stain, MD [place]            desvenlafaxine (PRISTIQ) 25 MG 24 hr tablet 60 tablet 3    Sig: Take 3 tablets (75 mg total) by mouth daily.     Psychiatry: Antidepressants - SNRI - desvenlafaxine & venlafaxine Failed - 11/25/2021 11:11 AM      Failed - Lipid Panel in normal range within the last 12 months    Cholesterol, Total  Date Value Ref Range Status  05/16/2021 183 100 - 199 mg/dL Final   LDL Chol Calc (NIH)  Date Value Ref Range Status  05/16/2021 69 0 - 99 mg/dL Final   HDL  Date Value Ref Range Status  05/16/2021 94 >39 mg/dL Final   Triglycerides  Date Value Ref Range Status  05/16/2021 118 0 - 149 mg/dL Final         Passed - Cr in normal range and within 360 days    Creatinine, Ser  Date Value Ref Range Status  11/11/2021 0.73 0.57 - 1.00 mg/dL Final         Passed - Completed PHQ-2 or PHQ-9 in the last 360 days      Passed - Last BP in normal range    BP Readings  from Last 1 Encounters:  11/24/21 104/89         Passed - Valid encounter within last 6 months    Recent Outpatient Visits          1 month ago Essential hypertension   [place], MD   6 months ago Essential hypertension   [place], MD      Future Appointments            In 2 weeks Wynetta Emery,  Dalbert Batman, MD [place]   In 1 month Joya Gaskins Burnett Harry, MD [place]

## 2021-11-25 NOTE — Telephone Encounter (Signed)
Prescribing provider for that medication is not at this office. Patient will need to contact the office on [address], Toy Cookey, NP.  ?LVM on patient phone and gave phone number she can call [phone] . ? ?Several medications were prescribed from that office today.   ?

## 2021-11-25 NOTE — Telephone Encounter (Signed)
Pt called note back again saying Dr. Delford Field told her he would take care of all of her medications being refilled since Dr. Doyne Keel is on Maternity leave.  Thre are multiple messages today and yesterday about her medications and her refills.  Patient said she is going to run out of some of her medications if this isn't take care of by today.   ? ?CB#  [phone] ?

## 2021-11-28 ENCOUNTER — Other Ambulatory Visit (HOSPITAL_COMMUNITY): Payer: Self-pay

## 2021-11-28 ENCOUNTER — Other Ambulatory Visit: Payer: Self-pay | Admitting: Critical Care Medicine

## 2021-11-28 MED ORDER — PANTOPRAZOLE SODIUM 20 MG PO TBEC
20.0000 mg | DELAYED_RELEASE_TABLET | Freq: Every day | ORAL | 4 refills | Status: DC
  Filled 2021-11-28: qty 60, 60d supply, fill #0

## 2021-11-28 MED ORDER — ATORVASTATIN CALCIUM 20 MG PO TABS
20.0000 mg | ORAL_TABLET | Freq: Every day | ORAL | 0 refills | Status: DC
  Filled 2021-11-28: qty 60, 60d supply, fill #0

## 2021-11-28 MED ORDER — OLANZAPINE 2.5 MG PO TABS
2.5000 mg | ORAL_TABLET | Freq: Every evening | ORAL | 3 refills | Status: DC | PRN
  Filled 2021-11-28: qty 30, 30d supply, fill #0

## 2021-11-28 MED ORDER — CLONIDINE HCL 0.2 MG PO TABS
0.2000 mg | ORAL_TABLET | Freq: Every day | ORAL | 4 refills | Status: DC
  Filled 2021-11-28: qty 60, 60d supply, fill #0

## 2021-11-28 MED ORDER — DESVENLAFAXINE SUCCINATE ER 25 MG PO TB24
75.0000 mg | ORAL_TABLET | Freq: Every day | ORAL | 3 refills | Status: DC
  Filled 2021-11-28: qty 90, 30d supply, fill #0
  Filled 2021-11-28: qty 30, 10d supply, fill #0

## 2021-11-28 MED ORDER — HYDROXYZINE HCL 10 MG PO TABS
10.0000 mg | ORAL_TABLET | Freq: Three times a day (TID) | ORAL | 3 refills | Status: DC | PRN
  Filled 2021-11-28: qty 90, 30d supply, fill #0

## 2021-11-28 MED ORDER — PROPRANOLOL HCL 10 MG PO TABS
10.0000 mg | ORAL_TABLET | Freq: Every morning | ORAL | 0 refills | Status: DC
  Filled 2021-11-28: qty 60, 60d supply, fill #0

## 2021-11-28 NOTE — Telephone Encounter (Signed)
[facility] called and spoke to [place], [facility] about the refill(s) Pristiq requested. She says they received the Rx with a quantity of 60, but they need it to say 90 since patient is taking it 3 times a day to last for 30 days. Advised it will be sent in with 90. ?

## 2021-11-28 NOTE — Telephone Encounter (Signed)
So, i am traveling and will not be able to refill anything until i get home after 4p today ,find out what 24hr pharmacy she wants meds to go

## 2021-11-28 NOTE — Telephone Encounter (Signed)
Looks like I sent many refills authorized on 3/3  the remainder I just sent  to [place] [facility] ? ?Remind pt she has appt with me in may ?

## 2021-11-28 NOTE — Telephone Encounter (Addendum)
Left message on voicemail to return call.  ? ?Advised that medication was sent to pharmacy today and others o3/11/2021. Advised to call office for further assistance.  ? ?Also advised on vm of Dr. Lynelle Doctor message.  ? ?Awaiting call back from patient if needed.  ? ? ?

## 2021-11-28 NOTE — Telephone Encounter (Signed)
Resending the way the provider ordered 3 days ago, but changing the quantity to 90 tabs, still leave the 3 refills per provider initial refill. ? ?Requested Prescriptions  ?Pending Prescriptions Disp Refills  ?? desvenlafaxine (PRISTIQ) 25 MG 24 hr tablet 90 tablet 3  ?  Sig: Take 3 tablets (75 mg total) by mouth daily.  ?  ? Psychiatry: Antidepressants - SNRI - desvenlafaxine & venlafaxine Failed - 11/28/2021  9:33 AM  ?  ?  Failed - Lipid Panel in normal range within the last 12 months  ?  Cholesterol, Total  ?Date Value Ref Range Status  ?05/16/2021 183 100 - 199 mg/dL Final  ? ?LDL Chol Calc (NIH)  ?Date Value Ref Range Status  ?05/16/2021 69 0 - 99 mg/dL Final  ? ?HDL  ?Date Value Ref Range Status  ?05/16/2021 94 >39 mg/dL Final  ? ?Triglycerides  ?Date Value Ref Range Status  ?05/16/2021 118 0 - 149 mg/dL Final  ? ?  ?  ?  Passed - Cr in normal range and within 360 days  ?  Creatinine, Ser  ?Date Value Ref Range Status  ?11/11/2021 0.73 0.57 - 1.00 mg/dL Final  ?   ?  ?  Passed - Completed PHQ-2 or PHQ-9 in the last 360 days  ?  ?  Passed - Last BP in normal range  ?  BP Readings from Last 1 Encounters:  ?11/24/21 104/89  ?   ?  ?  Passed - Valid encounter within last 6 months  ?  Recent Outpatient Visits   ?      ? 1 month ago Essential hypertension  ? [facility] Storm Frisk, MD  ? 6 months ago Essential hypertension  ? [facility] Storm Frisk, MD  ?  ?  ?Future Appointments   ?        ? In 2 weeks Marcine Matar, MD [facility]  ? In 1 month Delford Field Charlcie Cradle, MD [facility]  ?  ? ?  ?  ?  ? ?

## 2021-11-28 NOTE — Addendum Note (Signed)
Addended by: Shan Levans E on: 11/28/2021 04:56 PM ? ? Modules accepted: Orders ? ?

## 2021-11-29 ENCOUNTER — Other Ambulatory Visit: Payer: Self-pay

## 2021-11-29 ENCOUNTER — Ambulatory Visit (HOSPITAL_COMMUNITY): Payer: No Typology Code available for payment source | Attending: Cardiovascular Disease

## 2021-11-29 DIAGNOSIS — I10 Essential (primary) hypertension: Secondary | ICD-10-CM | POA: Insufficient documentation

## 2021-11-29 DIAGNOSIS — R079 Chest pain, unspecified: Secondary | ICD-10-CM

## 2021-11-29 DIAGNOSIS — I071 Rheumatic tricuspid insufficiency: Secondary | ICD-10-CM | POA: Insufficient documentation

## 2021-11-29 DIAGNOSIS — E782 Mixed hyperlipidemia: Secondary | ICD-10-CM | POA: Insufficient documentation

## 2021-11-29 DIAGNOSIS — R0600 Dyspnea, unspecified: Secondary | ICD-10-CM | POA: Insufficient documentation

## 2021-11-29 LAB — ECHOCARDIOGRAM COMPLETE
Area-P 1/2: 3.44 cm2
S' Lateral: 2.2 cm

## 2021-12-01 ENCOUNTER — Other Ambulatory Visit: Payer: Self-pay

## 2021-12-01 ENCOUNTER — Ambulatory Visit
Admission: RE | Admit: 2021-12-01 | Discharge: 2021-12-01 | Disposition: A | Discharge status: Home / Self Care | Payer: No Typology Code available for payment source | Source: Ambulatory Visit | Attending: Critical Care Medicine | Admitting: Critical Care Medicine

## 2021-12-01 DIAGNOSIS — Z1231 Encounter for screening mammogram for malignant neoplasm of breast: Secondary | ICD-10-CM

## 2021-12-07 ENCOUNTER — Telehealth: Payer: Self-pay

## 2021-12-07 NOTE — Telephone Encounter (Signed)
Review of labs :  no diabetes seen    ? ?If she has joint pain in knees we can refer her to orthopedics. ? ? ?

## 2021-12-07 NOTE — Telephone Encounter (Signed)
Pt was called and is aware of results, DOB was confirmed.  ? ? ?*Patient also let me that she had labs done and was told that her sugar is high, also states that she has had joint pain in her knees and feet( 6 months now)and would like for you to review her chat * ?

## 2021-12-07 NOTE — Telephone Encounter (Signed)
-----   Message from Storm Frisk, MD sent at 12/02/2021 11:52 AM EST ----- ?Let ms friddle yaun know mammogram negative recheck one year  ?

## 2021-12-08 NOTE — Telephone Encounter (Signed)
Called pt and she is aware of notes, pt stated that she will wait until may when she see Dr.wright since their is no diabetes  ?

## 2021-12-15 ENCOUNTER — Ambulatory Visit: Payer: Self-pay | Admitting: Internal Medicine

## 2021-12-17 NOTE — Progress Notes (Signed)
? ?Established Patient Office Visit ? ?Subjective:  ?Patient ID: Margaret Mccann, female    DOB: [birth year]  Age: 51 y.o. MRN: [id number] ? ?CC: primary care follow up , loss of income/job for affording medications ? ?HPI ? ?04/2021 ?  ?Referred for HTN mgmt and PCP to est by [facility] [name] ?This patient has a history of PTSD substance use prior suicide attempts, major depression and severe anxiety.  The patient was hospitalized in March with hypoxic respiratory failure and polysubstance overdose.  Patient then went to rehab for a 19-month interval.  Since that time she is in a better mental health state.  Patient is referred by the mental [place] for primary care to establish.  She has not had a primary care provider in recent times.  She states that clonidine helps her PTSD and is requesting refills on this and propranolol.  Today on arrival blood pressure is 119/58 pulse 76 and she is already on the clonidine 0.2 mg daily.  She was also given a short-term refill on Zyprexa which she takes at bedtime to help with sleep as needed.  She is requesting refills on this as well. ?  ?Note during the March hospitalization the patient had an echocardiogram that showed significant tricuspid regurgitation but normal right heart pressures ? ?Patient does have a gynecologist did have a Pap smear over 3 years ago and is now becoming due.  Patient does need hepatitis C screening.  Note patient's not actively suicidal at this time.  She does have reflux symptoms on a daily basis.  She is on over-the-counter Nexium is requesting a prescription for Protonix. ? ?Patient is due a mammogram.  Patient's mental health stressors are that her fianc? hung himself in their home last fall.  Also her sister committed suicide in October.  This resulted in significant issues for this patient. ? ?10/24/21 ?Since the last visit the patient unfortunately has lost her job and cannot afford to pay for her medications.  She does not have  any insurance.  She cannot afford any of the [employer].  On arrival blood pressure 111/76 she is doing well on the clonidine twice daily.  Her PHQ-9 and GAD-7 are mildly elevated.  She is not suicidal.  She tried to go see cardiology but could not make the appointment because her boss would not let her off work for this visit.  Also she was unable to attend a mammogram appointment twice because of her job.  She also needs colon cancer screening.  Note on arrival she did receive the flu vaccine ? ?The patient has no other complaints ? ?3/27 ?Patient returns in follow-up and complains of bilateral knee pain and progressive weight gain.  She normally weighs in the 120 range now she is 182 with a BMI of 32.  She had been seen by cardiology had a negative coronary CT and echocardiogram was unremarkable ? ?The patient has been seen by cardiology assessment is as below ?Tricuspid valve insufficiency, unspecified etiology ?  ?Essential hypertension ?  ?Mixed hyperlipidemia ?  ?Chest pain of uncertain etiology ?  ?Dyspnea, unspecified type ?  ?  ?  ?PLAN:   ?  ?In order of problems listed above: ?  ?1.  I reviewed her echocardiogram from 2022 which does demonstrate good deal of tricuspid regurgitation.  I will repeat an echocardiogram now.  We may need to perform a right heart catheterization depending on these results.  Follow-up in 1 year or  earlier if needed ?2.  Blood pressure is at goal today. ?3.  This is being followed by her primary care provider. ?4.  We will obtain coronary CTA.  This would also inform the need for aspirin. ?5.  We will obtain echocardiogram to evaluate.  I have a feeling this is due to her 60 pound weight gain. ?  ?Coronary CT was normal and echocardiogram was normal blood pressure remains at goal on clonidine.  Mental health is stable.  She does have bilateral knee pain.  She does not eat breakfast she takes protein shakes with nuts for lunch she eats salads chicken breast sweet  potatoes for dinner she is not getting much fresh fruits or vegetables in her diet.  She does walk daily for about an hour with her dog which is a lab or doodle.  She would like diabetes screening at this visit.  She complains of fractured left molar in the left lower jaw.  She would like a dental exam.  She now has the orange card and blue card.  She still has reflux if she overeats at night late.  She has not had a period in 1 year and she is sleeping better since she is in recovery.  She is questioning whether she could obtain progesterone for menopause. ?    Recent mammogram was normal.  She has a Pap smear appointment upcoming.  There are no other primary care gaps. ?Past Medical History:  ?Diagnosis Date  ? Alcohol withdrawal seizure with complication, with unspecified complication ([place]) 0000000  ? Anxiety   ? Bipolar 1 disorder ([place])   ? Depression   ? Intentional drug overdose ([place]) 08/04/2020  ? Major depressive disorder, recurrent episode with anxious distress ([place]) 08/11/2020  ? Morgellons syndrome   ? Suicide attempt [facility])   ? Suicide by drug overdose ([place]) 08/11/2020  ? Tricuspid valve regurgitation 12/18/2020  ? ? ?Past Surgical History:  ?Procedure Laterality Date  ? APPENDECTOMY    ? ? ?Family History  ?Problem Relation Age of Onset  ? Heart attack Father 72  ? Stroke Father   ? Severe combined immunodeficiency Sister   ? Breast cancer Paternal Aunt   ? Ulcerative colitis Neg Hx   ? Esophageal cancer Neg Hx   ? ? ?Social History  ? ?Socioeconomic History  ? Marital status: Single  ?  Spouse name: Not on file  ? Number of children: Not on file  ? Years of education: Not on file  ? Highest education level: Not on file  ?Occupational History  ? Not on file  ?Tobacco Use  ? Smoking status: Former  ?  Packs/day: 0.50  ?  Types: Cigarettes  ? Smokeless tobacco: Never  ?Vaping Use  ? Vaping Use: Never used  ?Substance and Sexual Activity  ? Alcohol use: Yes  ?  Comment: occas  ? Drug use: Not Currently  ?   Comment: not currently-was +cocaine and amphetamines, clean x3 years  ? Sexual activity: Yes  ?  Birth control/protection: Pill  ?Other Topics Concern  ? Not on file  ?Social History Narrative  ? ** Merged History Encounter **  ?    ? ?Social Determinants of Health  ? ?Financial Resource Strain: Not on file  ?Food Insecurity: Not on file  ?Transportation Needs: Not on file  ?Physical Activity: Not on file  ?Stress: Not on file  ?Social Connections: Not on file  ?Intimate Partner Violence: Not on file  ? ? ?Outpatient Medications Prior  to Visit  ?Medication Sig Dispense Refill  ? atorvastatin (LIPITOR) 20 MG tablet Take 1 tablet (20 mg total) by mouth at bedtime. 60 tablet 0  ? cloNIDine (CATAPRES) 0.2 MG tablet Take 1 tablet (0.2 mg total) by mouth at bedtime. 60 tablet 4  ? desvenlafaxine (PRISTIQ) 25 MG 24 hr tablet Take 3 tablets (75 mg total) by mouth daily. 90 tablet 3  ? gabapentin (NEURONTIN) 600 MG tablet Take 2 tablets (1,200 mg total) by mouth 2 (two) times daily. 120 tablet 3  ? hydrOXYzine (ATARAX) 10 MG tablet Take 1 tablet (10 mg total) by mouth 3 (three) times daily as needed. 90 tablet 3  ? metoprolol tartrate (LOPRESSOR) 100 MG tablet Take 1 tablet (100 mg total) by mouth once for 1 dose. Take 90-120 minutes prior to scan. 1 tablet 0  ? mirtazapine (REMERON) 30 MG tablet Take 1 tablet (30 mg total) by mouth at bedtime. 30 tablet 3  ? OLANZapine (ZYPREXA) 2.5 MG tablet Take 1 tablet (2.5 mg total) by mouth at bedtime as needed (insomnia/sleep). 30 tablet 3  ? pantoprazole (PROTONIX) 20 MG tablet Take 1 tablet (20 mg total) by mouth daily. 60 tablet 4  ? propranolol (INDERAL) 10 MG tablet Take 1 tablet (10 mg total) by mouth every morning. 60 tablet 0  ? mirtazapine (REMERON) 30 MG tablet Take 1 tablet (30 mg total) by mouth at bedtime. 30 tablet 3  ? OLANZapine (ZYPREXA) 2.5 MG tablet Take 1 tablet (2.5 mg total) by mouth at bedtime as needed (insomnia/sleep). 30 tablet 3  ? ?No facility-administered  medications prior to visit.  ? ? ?Allergies  ?Allergen Reactions  ? Lamictal [Lamotrigine] Anaphylaxis, Rash and Other (See Comments)  ?  Stevens-Johnson syndrome and fevers, also  ? Lamictal [Lamot

## 2021-12-19 ENCOUNTER — Other Ambulatory Visit: Payer: Self-pay

## 2021-12-19 ENCOUNTER — Encounter: Payer: Self-pay | Admitting: Critical Care Medicine

## 2021-12-19 ENCOUNTER — Ambulatory Visit: Payer: Self-pay | Attending: Critical Care Medicine | Admitting: Critical Care Medicine

## 2021-12-19 VITALS — BP 110/76 | HR 90 | Temp 98.7°F | Resp 18 | Ht 63.0 in | Wt 182.0 lb

## 2021-12-19 DIAGNOSIS — I10 Essential (primary) hypertension: Secondary | ICD-10-CM

## 2021-12-19 DIAGNOSIS — F332 Major depressive disorder, recurrent severe without psychotic features: Secondary | ICD-10-CM

## 2021-12-19 DIAGNOSIS — E669 Obesity, unspecified: Secondary | ICD-10-CM

## 2021-12-19 DIAGNOSIS — G8929 Other chronic pain: Secondary | ICD-10-CM

## 2021-12-19 DIAGNOSIS — N951 Menopausal and female climacteric states: Secondary | ICD-10-CM

## 2021-12-19 DIAGNOSIS — I071 Rheumatic tricuspid insufficiency: Secondary | ICD-10-CM

## 2021-12-19 DIAGNOSIS — E782 Mixed hyperlipidemia: Secondary | ICD-10-CM

## 2021-12-19 DIAGNOSIS — M25562 Pain in left knee: Secondary | ICD-10-CM

## 2021-12-19 DIAGNOSIS — F3341 Major depressive disorder, recurrent, in partial remission: Secondary | ICD-10-CM

## 2021-12-19 DIAGNOSIS — F4310 Post-traumatic stress disorder, unspecified: Secondary | ICD-10-CM

## 2021-12-19 DIAGNOSIS — F4321 Adjustment disorder with depressed mood: Secondary | ICD-10-CM

## 2021-12-19 DIAGNOSIS — K029 Dental caries, unspecified: Secondary | ICD-10-CM

## 2021-12-19 DIAGNOSIS — M25561 Pain in right knee: Secondary | ICD-10-CM

## 2021-12-19 DIAGNOSIS — E66811 Obesity, class 1: Secondary | ICD-10-CM

## 2021-12-19 DIAGNOSIS — E6609 Other obesity due to excess calories: Secondary | ICD-10-CM

## 2021-12-19 DIAGNOSIS — Z6832 Body mass index (BMI) 32.0-32.9, adult: Secondary | ICD-10-CM

## 2021-12-19 MED ORDER — HYDROXYZINE HCL 10 MG PO TABS
10.0000 mg | ORAL_TABLET | Freq: Three times a day (TID) | ORAL | 3 refills | Status: DC | PRN
  Filled 2021-12-19 – 2022-01-05 (×2): qty 90, 30d supply, fill #0
  Filled 2022-02-06: qty 90, 30d supply, fill #1

## 2021-12-19 MED ORDER — CLONIDINE HCL 0.2 MG PO TABS
0.2000 mg | ORAL_TABLET | Freq: Every day | ORAL | 4 refills | Status: DC
  Filled 2021-12-19: qty 30, 30d supply, fill #0
  Filled 2022-01-16: qty 30, 30d supply, fill #1
  Filled 2022-02-06: qty 30, 30d supply, fill #2
  Filled 2022-02-28: qty 30, 30d supply, fill #3
  Filled 2022-03-23: qty 30, 30d supply, fill #4
  Filled 2022-04-16: qty 30, 30d supply, fill #5

## 2021-12-19 MED ORDER — PROPRANOLOL HCL 10 MG PO TABS
10.0000 mg | ORAL_TABLET | Freq: Every morning | ORAL | 0 refills | Status: DC
  Filled 2021-12-19: qty 60, 60d supply, fill #0

## 2021-12-19 MED ORDER — MIRTAZAPINE 30 MG PO TABS
30.0000 mg | ORAL_TABLET | Freq: Every day | ORAL | 3 refills | Status: DC
  Filled 2021-12-19: qty 30, 30d supply, fill #0
  Filled 2022-01-16: qty 30, 30d supply, fill #1
  Filled 2022-02-06: qty 30, 30d supply, fill #2
  Filled 2022-03-15: qty 30, 30d supply, fill #3

## 2021-12-19 MED ORDER — PANTOPRAZOLE SODIUM 20 MG PO TBEC
20.0000 mg | DELAYED_RELEASE_TABLET | Freq: Every day | ORAL | 4 refills | Status: DC
  Filled 2021-12-19: qty 60, 60d supply, fill #0
  Filled 2022-02-27: qty 30, 30d supply, fill #1
  Filled 2022-03-23: qty 30, 30d supply, fill #2
  Filled 2022-04-16: qty 30, 30d supply, fill #3

## 2021-12-19 MED ORDER — OLANZAPINE 2.5 MG PO TABS
2.5000 mg | ORAL_TABLET | Freq: Every evening | ORAL | 3 refills | Status: DC | PRN
  Filled 2021-12-19: qty 30, 30d supply, fill #0
  Filled 2022-01-16: qty 30, 30d supply, fill #1
  Filled 2022-02-06: qty 30, 30d supply, fill #2

## 2021-12-19 MED ORDER — NORETHINDRONE 0.35 MG PO TABS
1.0000 | ORAL_TABLET | Freq: Every day | ORAL | 11 refills | Status: DC
  Filled 2021-12-19: qty 28, 28d supply, fill #0
  Filled 2022-01-16: qty 28, 28d supply, fill #1
  Filled 2022-02-27: qty 28, 28d supply, fill #2
  Filled 2022-03-23: qty 28, 28d supply, fill #3
  Filled 2022-04-16: qty 28, 28d supply, fill #4

## 2021-12-19 MED ORDER — ATORVASTATIN CALCIUM 20 MG PO TABS
20.0000 mg | ORAL_TABLET | Freq: Every day | ORAL | 0 refills | Status: DC
  Filled 2021-12-19: qty 30, 30d supply, fill #0
  Filled 2022-02-16: qty 30, 30d supply, fill #1

## 2021-12-19 MED ORDER — DESVENLAFAXINE SUCCINATE ER 25 MG PO TB24
75.0000 mg | ORAL_TABLET | Freq: Every day | ORAL | 3 refills | Status: DC
  Filled 2021-12-19: qty 90, 30d supply, fill #0
  Filled 2022-02-28: qty 90, 30d supply, fill #1
  Filled 2022-03-23: qty 90, 30d supply, fill #2

## 2021-12-19 MED ORDER — GABAPENTIN 600 MG PO TABS
1200.0000 mg | ORAL_TABLET | Freq: Two times a day (BID) | ORAL | 3 refills | Status: DC
  Filled 2021-12-19: qty 120, 30d supply, fill #0
  Filled 2022-01-16: qty 120, 30d supply, fill #1
  Filled 2022-03-08: qty 120, 30d supply, fill #2

## 2021-12-19 NOTE — Assessment & Plan Note (Signed)
Blood pressure well controlled continue clonidine ?

## 2021-12-19 NOTE — Assessment & Plan Note (Addendum)
Patient with BMI of 32 given lifestyle medicine handout given instructions as to proper diet ? ?We will check A1c ?

## 2021-12-19 NOTE — Assessment & Plan Note (Signed)
Coronaries were normal ? ?Follow a healthy diet lifestyle medicine handout given to patient ? ?Continue low-dose Lipitor ?

## 2021-12-19 NOTE — Assessment & Plan Note (Signed)
Exacerbated by weight gain also has bilateral ankle pain exacerbated by weight gain ? ?Patient given knee exercises ?

## 2021-12-19 NOTE — Patient Instructions (Signed)
Follow lifestyle medicine as outlined on handout we gave you particular diet ? ?Follow knee exercises per attachment ? ?Referral to dentistry was made ? ?Begin norethindrone 1 daily 28 days of the month ? ?Return to see Dr. Delford Field 4 months ? ?Refills on all your medications including your mental health medicines were sent to our pharmacy for future refill ? ? ?

## 2021-12-19 NOTE — Assessment & Plan Note (Signed)
This has resolved.

## 2021-12-19 NOTE — Assessment & Plan Note (Signed)
Plan trial of norethindrone ?

## 2021-12-19 NOTE — Assessment & Plan Note (Signed)
No evidence of this at current echocardiogram will resolve this problem ?

## 2021-12-19 NOTE — Assessment & Plan Note (Signed)
Improved with the use of clonidine and propranolol  ? ?Continue care per mental health ? ?Refills on all medications sent to our pharmacy note she has the orange card and blue card ?

## 2021-12-19 NOTE — Progress Notes (Signed)
Patient has had coffee with cream and sugar around 7:30 am. ?Patient has taken medication today. ?Patient re;orts generalized pain in the knees, ankles and r shoulder. ?Patient is concerned with weight gain. ?

## 2021-12-19 NOTE — Assessment & Plan Note (Signed)
Mental health markedly improved on Pristiq gabapentin hydroxyzine Remeron and propranolol ?

## 2021-12-20 ENCOUNTER — Other Ambulatory Visit: Payer: Self-pay

## 2021-12-20 LAB — HEMOGLOBIN A1C
Est. average glucose Bld gHb Est-mCnc: 128 mg/dL
Hgb A1c MFr Bld: 6.1 % — ABNORMAL HIGH (ref 4.8–5.6)

## 2021-12-22 ENCOUNTER — Telehealth: Payer: Self-pay

## 2021-12-22 NOTE — Telephone Encounter (Signed)
-----   Message from Storm Frisk, MD sent at 12/20/2021  5:52 AM EDT ----- ?Let pt know A1C shows prediabetes from weight gain recommend healthy diet we discussed at visit, no medications needed     ?

## 2021-12-22 NOTE — Telephone Encounter (Signed)
Pt was called and is aware of results, DOB was confirmed.  ?

## 2022-01-02 ENCOUNTER — Telehealth: Payer: Self-pay | Admitting: Critical Care Medicine

## 2022-01-02 ENCOUNTER — Ambulatory Visit: Payer: Self-pay | Admitting: *Deleted

## 2022-01-02 ENCOUNTER — Other Ambulatory Visit: Payer: Self-pay

## 2022-01-02 ENCOUNTER — Other Ambulatory Visit: Payer: Self-pay | Admitting: Physician Assistant

## 2022-01-02 NOTE — Telephone Encounter (Signed)
Pt calling back to follow up on request for pain medication for her tooth.  Pt states the temporary filling came out.  She is in excruciating pain now that temporary filling that come out.  ?Pt states she does not understand why she would need to come in for an appointment when Dr Delford Field has already seen her for this. ?Pt states she cannot go through another night like this.  ?Pt would like to know if someone can get with Dr Delford Field and ask him for his help. ?She states even if only for a few days, she will get what she can take. ?

## 2022-01-02 NOTE — Telephone Encounter (Signed)
Dental referral made by Dr. Delford Field on 12/19/2021. ?Advised patient to call [facility] ph. # [phone]. Has [employer] coverage until 04/2022 ? ? ?Advised patient to call [facility] for an appt. ? ? ?

## 2022-01-02 NOTE — Telephone Encounter (Signed)
?  Chief Complaint: toothache pain  ?Symptoms: pain in left upper top molar last tooth. Filling came out and now has extreme pain. Referral was sent waiting on a call back . Patient requesting any kind of pain medication. OTC not working . Unable to eat regular foods  only soup ?Frequency: since the weekend  ?Pertinent Negatives: Patient denies fever,  no facial swelling  ?Disposition: [] ED /[x] Urgent Care (no appt availability in office) / [] Appointment(In office/virtual)/ []  [facility]/ [] Home Care/ [] Refused Recommended Disposition /[] [facility]/ []  Follow-up with PCP ?Additional Notes:  ? ?Reviewed with patient Urgent tooth  pay out of her pocket   [phone]   [address] . Patient would like a call back if medication can be prescribed. ? ? Reason for Disposition ? Toothache present > 24 hours ? ?Answer Assessment - Initial Assessment Questions ?1. LOCATION: "Which tooth is hurting?"  (e.g., right-side/left-side, upper/lower, front/back) ?    Last molar on left top  ?2. ONSET: "When did the toothache start?"  (e.g., hours, days)  ?    Filling fell out and over weekend started hurting worse ?3. SEVERITY: "How bad is the toothache?"  (Scale 1-10; mild, moderate or severe) ?  - MILD (1-3): doesn't interfere with chewing  ?  - MODERATE (4-7): interferes with chewing, interferes with normal activities, awakens from sleep   ?  - SEVERE (8-10): unable to eat, unable to do any normal activities, excruciating pain    ?    8/10 pain  ?4. SWELLING: "Is there any visible swelling of your face?" ?    denies ?5. OTHER SYMPTOMS: "Do you have any other symptoms?" (e.g., fever) ?    No  ?6. PREGNANCY: "Is there any chance you are pregnant?" "When was your last menstrual period?" ?    na ? ?Protocols used: Toothache-A-AH ? ?

## 2022-01-02 NOTE — Progress Notes (Signed)
Patient requesting medication for dental pain, states over-the-counter pain medications are not offering relief.  Patient unable to take tramadol due to allergy.  Patient offered virtual or in person appointment with mobile team.  Patient has referral in place for dental. ? ?[name], PA-C ?Physician Assistant ?[facility] ?[email or web address] ? ?

## 2022-01-02 NOTE — Telephone Encounter (Signed)
Dr. Delford Field is out of the office this week. Will route request to the provider covering for him to approve if appropriate.  ?

## 2022-01-03 ENCOUNTER — Other Ambulatory Visit: Payer: Self-pay

## 2022-01-03 ENCOUNTER — Telehealth: Payer: Self-pay | Admitting: Critical Care Medicine

## 2022-01-03 ENCOUNTER — Other Ambulatory Visit: Payer: Self-pay | Admitting: Physician Assistant

## 2022-01-03 DIAGNOSIS — K0889 Other specified disorders of teeth and supporting structures: Secondary | ICD-10-CM

## 2022-01-03 MED ORDER — ACETAMINOPHEN-CODEINE 300-30 MG PO TABS
1.0000 | ORAL_TABLET | ORAL | 0 refills | Status: AC | PRN
  Filled 2022-01-03: qty 30, 5d supply, fill #0

## 2022-01-03 NOTE — Telephone Encounter (Signed)
Called and take with pt she says has been doing what you listed and was unable to move the appt up. Is there anything else other that she can do ? ? ?

## 2022-01-03 NOTE — Progress Notes (Signed)
Patient able to tolerate Tylenol 3.  Patient has appointment with dentistry on Monday, unable to move appointment up.  Check of [place] controlled substance registry appropriate ? ?[name], PA-C ?Physician Assistant ?[facility] ?[email or web address] ? ?

## 2022-01-03 NOTE — Telephone Encounter (Signed)
Pt has called back requesting something be sent to [facility]  ?[address] [place], [place] [address]  ?Phone:  [phone]  Fax:  [phone]  ?DEA #[id number] for her dental pain ?She does have an appointment with dentist but can not be seen until 01/09/22. She states she has already been seen in office ?

## 2022-01-03 NOTE — Telephone Encounter (Signed)
Called pt and she stated that has tolerated tylenol-3 well no reaction. She use our pharmacy  ? ?

## 2022-01-03 NOTE — Telephone Encounter (Unsigned)
Copied from [place] [id number]. Topic: General - Other ?>> Jan 02, 2022  4:30 PM Yvette Rack wrote: ?Reason for CRM: Pt stated she has not heard back from anyone regarding temporary filling coming out. Pt requests to be contacted asap today. ?>> Jan 03, 2022 10:21 AM Ledell Noss wrote: ?Spoke with patient. Needs meds for excruciating pain since  filling fell out. She has a dental appt on Monday but needs pain relief until then. ?

## 2022-01-03 NOTE — Telephone Encounter (Signed)
Fyi.

## 2022-01-03 NOTE — Telephone Encounter (Signed)
Called pt and she is aware of rx  ?

## 2022-01-03 NOTE — Telephone Encounter (Signed)
MA UTR patient x2 and LVM offering an appointment.

## 2022-01-03 NOTE — Telephone Encounter (Signed)
Copied from CRM #[id number]. Topic: General - Other ?>> Jan 02, 2022  4:30 PM McNeil, Ja-Kwan wrote: ?Reason for CRM: Pt stated she has not heard back from anyone regarding temporary filling coming out. Pt requests to be contacted asap today. ?>> Jan 03, 2022 10:21 AM Webster, Lorrie wrote: ?Spoke with patient. Needs meds for excruciating pain since  filling fell out. She has a dental appt on Monday but needs pain relief until then. ?

## 2022-01-05 ENCOUNTER — Other Ambulatory Visit: Payer: Self-pay

## 2022-01-06 ENCOUNTER — Other Ambulatory Visit: Payer: Self-pay

## 2022-01-06 MED ORDER — OLANZAPINE 2.5 MG PO TABS: ORAL_TABLET | ORAL | 3 refills | Status: DC

## 2022-01-06 MED ORDER — MIRTAZAPINE 30 MG PO TABS
ORAL_TABLET | ORAL | 3 refills | Status: DC
  Filled 2022-02-28: qty 30, 30d supply, fill #0

## 2022-01-06 MED ORDER — DESVENLAFAXINE SUCCINATE ER 25 MG PO TB24
ORAL_TABLET | ORAL | 3 refills | Status: DC
  Filled 2022-01-06: qty 60, 20d supply, fill #0
  Filled 2022-02-27: qty 90, 30d supply, fill #0

## 2022-01-06 MED ORDER — GABAPENTIN 600 MG PO TABS
ORAL_TABLET | ORAL | 3 refills | Status: DC
  Filled 2022-02-16: qty 120, 30d supply, fill #0

## 2022-01-06 MED ORDER — HYDROXYZINE HCL 10 MG PO TABS: ORAL_TABLET | ORAL | 0 refills | Status: DC

## 2022-01-12 ENCOUNTER — Telehealth (INDEPENDENT_AMBULATORY_CARE_PROVIDER_SITE_OTHER): Payer: No Payment, Other | Admitting: Psychiatry

## 2022-01-12 ENCOUNTER — Encounter (HOSPITAL_BASED_OUTPATIENT_CLINIC_OR_DEPARTMENT_OTHER): Payer: Self-pay | Admitting: Emergency Medicine

## 2022-01-12 ENCOUNTER — Other Ambulatory Visit: Payer: Self-pay

## 2022-01-12 ENCOUNTER — Emergency Department (HOSPITAL_BASED_OUTPATIENT_CLINIC_OR_DEPARTMENT_OTHER)
Admission: EM | Admit: 2022-01-12 | Discharge: 2022-01-12 | Disposition: A | Discharge status: Home / Self Care | Payer: No Typology Code available for payment source | Attending: Emergency Medicine | Admitting: Emergency Medicine

## 2022-01-12 ENCOUNTER — Emergency Department (HOSPITAL_BASED_OUTPATIENT_CLINIC_OR_DEPARTMENT_OTHER): Payer: No Typology Code available for payment source | Admitting: Radiology

## 2022-01-12 DIAGNOSIS — F332 Major depressive disorder, recurrent severe without psychotic features: Secondary | ICD-10-CM

## 2022-01-12 DIAGNOSIS — F4310 Post-traumatic stress disorder, unspecified: Secondary | ICD-10-CM

## 2022-01-12 DIAGNOSIS — J40 Bronchitis, not specified as acute or chronic: Secondary | ICD-10-CM

## 2022-01-12 DIAGNOSIS — R791 Abnormal coagulation profile: Secondary | ICD-10-CM | POA: Insufficient documentation

## 2022-01-12 DIAGNOSIS — Z20822 Contact with and (suspected) exposure to covid-19: Secondary | ICD-10-CM | POA: Insufficient documentation

## 2022-01-12 DIAGNOSIS — R21 Rash and other nonspecific skin eruption: Secondary | ICD-10-CM

## 2022-01-12 HISTORY — DX: Methicillin resistant Staphylococcus aureus infection, unspecified site: A49.02

## 2022-01-12 LAB — RESP PANEL BY RT-PCR (FLU A&B, COVID) ARPGX2
Influenza A by PCR: NEGATIVE
Influenza B by PCR: NEGATIVE
SARS Coronavirus 2 by RT PCR: NEGATIVE

## 2022-01-12 LAB — CBC WITH DIFFERENTIAL/PLATELET
Abs Immature Granulocytes: 0.03 10*3/uL (ref 0.00–0.07)
Basophils Absolute: 0 10*3/uL (ref 0.0–0.1)
Basophils Relative: 0 %
Eosinophils Absolute: 0.2 10*3/uL (ref 0.0–0.5)
Eosinophils Relative: 2 %
HCT: 42.2 % (ref 36.0–46.0)
Hemoglobin: 14 g/dL (ref 12.0–15.0)
Immature Granulocytes: 0 %
Lymphocytes Relative: 18 %
Lymphs Abs: 1.7 10*3/uL (ref 0.7–4.0)
MCH: 30 pg (ref 26.0–34.0)
MCHC: 33.2 g/dL (ref 30.0–36.0)
MCV: 90.4 fL (ref 80.0–100.0)
Monocytes Absolute: 0.7 10*3/uL (ref 0.1–1.0)
Monocytes Relative: 7 %
Neutro Abs: 6.7 10*3/uL (ref 1.7–7.7)
Neutrophils Relative %: 73 %
Platelets: 301 10*3/uL (ref 150–400)
RBC: 4.67 MIL/uL (ref 3.87–5.11)
RDW: 13 % (ref 11.5–15.5)
WBC: 9.3 10*3/uL (ref 4.0–10.5)
nRBC: 0 % (ref 0.0–0.2)

## 2022-01-12 LAB — COMPREHENSIVE METABOLIC PANEL
ALT: 54 U/L — ABNORMAL HIGH (ref 0–44)
AST: 30 U/L (ref 15–41)
Albumin: 4.9 g/dL (ref 3.5–5.0)
Alkaline Phosphatase: 101 U/L (ref 38–126)
Anion gap: 12 (ref 5–15)
BUN: 8 mg/dL (ref 6–20)
CO2: 22 mmol/L (ref 22–32)
Calcium: 10.2 mg/dL (ref 8.9–10.3)
Chloride: 102 mmol/L (ref 98–111)
Creatinine, Ser: 0.86 mg/dL (ref 0.44–1.00)
GFR, Estimated: >60 mL/min (ref >60)
Glucose, Bld: 87 mg/dL (ref 70–99)
Potassium: 4.1 mmol/L (ref 3.5–5.1)
Sodium: 136 mmol/L (ref 135–145)
Total Bilirubin: 0.4 mg/dL (ref 0.3–1.2)
Total Protein: 8.2 g/dL — ABNORMAL HIGH (ref 6.5–8.1)

## 2022-01-12 LAB — URINALYSIS, ROUTINE W REFLEX MICROSCOPIC
Bilirubin Urine: NEGATIVE
Glucose, UA: NEGATIVE mg/dL
Hgb urine dipstick: NEGATIVE
Ketones, ur: NEGATIVE mg/dL
Leukocytes,Ua: NEGATIVE
Nitrite: NEGATIVE
Protein, ur: NEGATIVE mg/dL
Specific Gravity, Urine: 1.01 (ref 1.005–1.030)
pH: 7 (ref 5.0–8.0)

## 2022-01-12 LAB — LACTIC ACID, PLASMA: Lactic Acid, Venous: 1.3 mmol/L (ref 0.5–1.9)

## 2022-01-12 LAB — PROTIME-INR
INR: 0.9 (ref 0.8–1.2)
Prothrombin Time: 12.4 seconds (ref 11.4–15.2)

## 2022-01-12 MED ORDER — DOXYCYCLINE HYCLATE 100 MG PO TABS
100.0000 mg | ORAL_TABLET | Freq: Once | ORAL | Status: AC
  Administered 2022-01-12: 100 mg via ORAL
  Filled 2022-01-12: qty 1

## 2022-01-12 MED ORDER — DOXYCYCLINE HYCLATE 100 MG PO CAPS
100.0000 mg | ORAL_CAPSULE | Freq: Two times a day (BID) | ORAL | 0 refills | Status: DC
  Filled 2022-01-12: qty 20, 10d supply, fill #0

## 2022-01-12 MED ORDER — BENZONATATE 100 MG PO CAPS
200.0000 mg | ORAL_CAPSULE | Freq: Once | ORAL | Status: AC
  Administered 2022-01-12: 200 mg via ORAL
  Filled 2022-01-12: qty 2

## 2022-01-12 MED ORDER — ACETYLCYSTEINE 20 % IN SOLN
3.0000 mL | Freq: Once | RESPIRATORY_TRACT | Status: AC
  Administered 2022-01-12: 3 mL via RESPIRATORY_TRACT
  Filled 2022-01-12: qty 4

## 2022-01-12 MED ORDER — IBUPROFEN 800 MG PO TABS: 800.0000 mg | ORAL_TABLET | Freq: Once | ORAL | Status: DC

## 2022-01-12 MED ORDER — ALBUTEROL SULFATE HFA 108 (90 BASE) MCG/ACT IN AERS
2.0000 | INHALATION_SPRAY | RESPIRATORY_TRACT | Status: DC | PRN
  Administered 2022-01-12: 2 via RESPIRATORY_TRACT
  Filled 2022-01-12: qty 6.7

## 2022-01-12 MED ORDER — IBUPROFEN 200 MG PO TABS
ORAL_TABLET | ORAL | Status: AC
  Filled 2022-01-12: qty 1

## 2022-01-12 MED ORDER — BENZONATATE 100 MG PO CAPS
100.0000 mg | ORAL_CAPSULE | Freq: Four times a day (QID) | ORAL | 0 refills | Status: DC | PRN
  Filled 2022-01-12: qty 30, 8d supply, fill #0

## 2022-01-12 MED ORDER — IPRATROPIUM-ALBUTEROL 0.5-2.5 (3) MG/3ML IN SOLN
3.0000 mL | Freq: Once | RESPIRATORY_TRACT | Status: AC
  Administered 2022-01-12: 3 mL via RESPIRATORY_TRACT
  Filled 2022-01-12: qty 3

## 2022-01-12 MED ORDER — AEROCHAMBER PLUS FLO-VU MISC
1.0000 | Freq: Once | Status: AC
Start: 2022-01-12 — End: 2022-01-12
  Administered 2022-01-12: 1
  Filled 2022-01-12: qty 1

## 2022-01-12 MED ORDER — ACETAMINOPHEN 325 MG PO TABS
325.0000 mg | ORAL_TABLET | Freq: Four times a day (QID) | ORAL | Status: DC | PRN
Start: 2022-01-12 — End: 2022-01-13
  Administered 2022-01-12: 325 mg via ORAL
  Filled 2022-01-12: qty 1

## 2022-01-12 MED ORDER — IBUPROFEN 200 MG PO TABS
200.0000 mg | ORAL_TABLET | Freq: Once | ORAL | Status: AC
  Administered 2022-01-12: 200 mg via ORAL

## 2022-01-12 MED ORDER — IBUPROFEN 100 MG/5ML PO SUSP: 200.0000 mg | Freq: Once | ORAL | Status: DC

## 2022-01-12 NOTE — ED Triage Notes (Signed)
Patient c/o cough and fevers x 3 days. Took home covid test that was negative. States she was treated for mrsa a few months ago and now having lesions on her left abdomen. Reports coughing up clear to pale yellow mucus. Took nyquil at around 4pm today.  ?

## 2022-01-12 NOTE — ED Notes (Signed)
RT educated pt on proper use of MDI/spacer as well as Flutter. Pt able to perform without difficulty. Pt verbalizes understanding of administration process and dosing of MDI.  ?

## 2022-01-12 NOTE — ED Provider Notes (Signed)
?[facility] DEPT ?Provider Note ? ? ?CSN: [id number] ?Arrival date & time: 01/12/22  1709 ? ?  ? ?History ? ?Chief Complaint  ?Patient presents with  ? Cough  ? Fever  ? ? ?Margaret Mccann is a 51 y.o. female. ? ?Pt reports she has been coughing for 3 days.  Pt reports she had a fever of 102 today.  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   ? ?The history is provided by the patient. No language interpreter was used.  ?Cough ?Cough characteristics:  Non-productive ?Sputum characteristics:  Nondescript ?Severity:  Moderate ?Onset quality:  Gradual ?Duration:  3 days ?Timing:  Constant ?Progression:  Worsening ?Chronicity:  New ?Relieved by:  Nothing ?Worsened by:  Nothing ?Associated symptoms: fever   ?Fever ?Max temp prior to arrival:  102 ?Associated symptoms: cough   ? ?  ? ?Home Medications ?Prior to Admission medications   ?Medication Sig Start Date End Date Taking? Authorizing Provider  ?atorvastatin (LIPITOR) 20 MG tablet Take 1 tablet (20 mg total) by mouth at bedtime. 12/19/21   Storm Frisk, MD  ?cloNIDine (CATAPRES) 0.2 MG tablet Take 1 tablet (0.2 mg total) by mouth at bedtime. 12/19/21   Storm Frisk, MD  ?desvenlafaxine (PRISTIQ) 25 MG 24 hr tablet Take 3 tablets (75 mg total) by mouth daily. 12/19/21   Storm Frisk, MD  ?desvenlafaxine (PRISTIQ) 25 MG 24 hr tablet Take 3 tablets by mouth once daily. 11/02/21   Shanna Cisco, NP  ?gabapentin (NEURONTIN) 600 MG tablet Take 2 tablets (1,200 mg total) by mouth 2 (two) times daily. 12/19/21   Storm Frisk, MD  ?gabapentin (NEURONTIN) 600 MG tablet Take 2 tablets by mouth twice daily. 11/02/21   Shanna Cisco, NP  ?hydrOXYzine (ATARAX) 10 MG tablet Take 1 tablet (10 mg total) by mouth 3 (three) times daily as needed. 12/19/21   Storm Frisk, MD  ?hydrOXYzine (ATARAX) 10 MG tablet Take 1 tablet by mouth 3 times daily as needed. 11/02/21   Shanna Cisco, NP  ?mirtazapine (REMERON) 30 MG tablet Take 1 tablet (30 mg total) by mouth at bedtime. 12/19/21   Storm Frisk, MD  ?mirtazapine (REMERON) 30 MG tablet Take 1 tablet by mouth at bedtime. 11/02/21   Shanna Cisco, NP  ?norethindrone (MICRONOR) 0.35 MG tablet Take 1 tablet (0.35 mg total) by mouth daily. 12/19/21   Storm Frisk, MD  ?OLANZapine (ZYPREXA) 2.5 MG tablet Take 1 tablet (2.5 mg total) by mouth at bedtime as needed (insomnia/sleep). 12/19/21   Storm Frisk, MD  ?OLANZapine (ZYPREXA) 2.5 MG tablet Take 1 tablet by mouth at bedtime as needed for insomnia or sleep. 11/02/21   Shanna Cisco, NP  ?pantoprazole (PROTONIX) 20 MG tablet Take 1 tablet (20 mg total) by mouth daily. 12/19/21   Storm Frisk, MD  ?propranolol (INDERAL) 10 MG tablet Take 1 tablet (10 mg total) by mouth every morning. 12/19/21   Storm Frisk, MD  ?   ? ?Allergies    ?Lamictal [lamotrigine], Lamictal [lamotrigine], Tramadol, Geodon [ziprasidone hcl], and Levetiracetam   ? ?Review of Systems   ?Review of Systems  ?Constitutional:  Positive for fever.  ?Respiratory:  Positive for cough.   ?All other systems reviewed and are negative. ? ?Physical Exam ?Updated Vital Signs ?BP 117/81   Pulse 84   Temp 99.9 ?F (37.7 ?  C) (Oral)   Resp (!) 25   Ht 5\' 3"  (1.6 m)   Wt 81.6 kg   LMP 10/27/2019   SpO2 93%   BMI 31.89 kg/m?  ?Physical Exam ?Vitals and nursing note reviewed.  ?Constitutional:   ?   Appearance: She is well-developed.  ?HENT:  ?   Head: Normocephalic.  ?   Mouth/Throat:  ?   Mouth: Mucous membranes are moist.  ?Cardiovascular:  ?   Rate and Rhythm: Normal rate.  ?Pulmonary:  ?   Effort: Pulmonary effort is normal.  ?   Breath sounds: Rhonchi present.  ?Abdominal:  ?   General: Abdomen is flat. There is no distension.  ?   Comments: Small red area approx 1.5 cm left upper abdomen  ?Musculoskeletal:     ?   General: Normal range of motion.  ?   Cervical back: Normal range of motion.   ?Skin: ?   General: Skin is warm.  ?Neurological:  ?   General: No focal deficit present.  ?   Mental Status: She is alert and oriented to person, place, and time.  ? ? ?ED Results / Procedures / Treatments   ?Labs ?(all labs ordered are listed, but only abnormal results are displayed) ?Labs Reviewed  ?COMPREHENSIVE METABOLIC PANEL - Abnormal; Notable for the following components:  ?    Result Value  ? Total Protein 8.2 (*)   ? ALT 54 (*)   ? All other components within normal limits  ?CULTURE, BLOOD (ROUTINE X 2)  ?CULTURE, BLOOD (ROUTINE X 2)  ?RESP PANEL BY RT-PCR (FLU A&B, COVID) [id number]  ?LACTIC ACID, PLASMA  ?CBC WITH DIFFERENTIAL/PLATELET  ?PROTIME-INR  ?URINALYSIS, ROUTINE W REFLEX MICROSCOPIC  ?LACTIC ACID, PLASMA  ? ? ?EKG ?EKG Interpretation ? ?Date/Time:  Thursday January 12 2022 17:44:49 EDT ?Ventricular Rate:  98 ?PR Interval:  136 ?QRS Duration: 70 ?QT Interval:  326 ?QTC Calculation: 416 ?R Axis:   -7 ?Text Interpretation: Normal sinus rhythm Nonspecific ST abnormality Abnormal ECG When compared with ECG of 17-Dec-2020 15:55, No significant change since last tracing Confirmed by Alvira MondaySchlossman, Erin ([id number]) on 01/12/2022 9:33:52 PM ? ?Radiology ?DG Chest Port 1 View ? ?Result Date: 01/12/2022 ?CLINICAL DATA:  Cough with fever. EXAM: PORTABLE CHEST 1 VIEW COMPARISON:  Chest x-ray 12/19/2020. FINDINGS: The heart size and mediastinal contours are within normal limits. Both lungs are clear. The visualized skeletal structures are unremarkable. IMPRESSION: No active disease. Electronically Signed   By: Darliss CheneyAmy  Guttmann M.D.   On: 01/12/2022 19:27   ? ?Procedures ?Procedures  ? ? ?Medications Ordered in ED ?Medications  ?acetaminophen (TYLENOL) tablet 325 mg (325 mg Oral Given 01/12/22 1925)  ?ibuprofen (ADVIL) tablet 800 mg (800 mg Oral Not Given 01/12/22 1931)  ?albuterol (VENTOLIN HFA) 108 (90 Base) MCG/ACT inhaler 2 puff (has no administration in time range)  ?ibuprofen (ADVIL) tablet 200 mg (200 mg Oral Given  01/12/22 1929)  ?ipratropium-albuterol (DUONEB) 0.5-2.5 (3) MG/3ML nebulizer solution 3 mL (3 mLs Nebulization Given 01/12/22 1959)  ?acetylcysteine (MUCOMYST) 20 % nebulizer / oral solution 3 mL (3 mLs Nebulization Given 01/12/22 2016)  ?aerochamber plus with mask device 1 each (1 each Other Given 01/12/22 2138)  ? ? ?ED Course/ Medical Decision Making/ A&P ?  ?                        ?Medical Decision Making ?Patient complains of a cough and congestion for the past 3 days ? ?Amount  and/or Complexity of Data Reviewed ?Independent Historian: parent ?   Details: Patient's mother is present and is concerned the patient's symptoms are from her smoking ?External Data Reviewed: notes. ?   Details: Primary care notes are reviewed ?Labs: ordered. Decision-making details documented in ED Course. ?   Details: COVID influenza are negative, complete blood count and CBC are normal ?Radiology: ordered and independent interpretation performed. Decision-making details documented in ED Course. ?   Details: Chest x-ray is read by radiologist as normal ? ?Risk ?OTC drugs. ?Prescription drug management. ?Risk Details: Patient is counseled on findings.  I will treat patient with doxycycline this will cover her for possible bronchitis as well is concerned that she might be developing MRSA.  Patient is given a prescription for Tessalon Perles to assist with coughing she is advised to use albuterol inhaler 2 puffs every 4 hours patient is advised to follow-up with her primary care doctor for recheck ? ?Patient is discharged in stable condition her oxygen saturations are 95% she is not currently having any wheezing or any difficulty breathing ?Respiratory therapy evaluated patient she was given a DuoNeb and utilized saline to help with cough nebulized saline to help with coughing.  Respiratory therapy is advised trying Mucomyst to help with phlegm and congestion.  Patient was given an albuterol inhaler and instructed in use by  respiratory. ? ? ? ? ? ? ?Final Clinical Impression(s) / ED Diagnoses ?Final diagnoses:  ?Bronchitis  ?Skin rash  ? ? ?Rx / DC Orders ?ED Discharge Orders   ? ?      Ordered  ?  doxycycline (VIBRAMYCIN) 100 MG capsule  2 times daily

## 2022-01-12 NOTE — Progress Notes (Signed)
BH MD/PA/NP OP Progress Note ? ?01/12/2022 4:30 PM ?Margaret Mccann  ?MRN:  [id number] ? ?Virtual Visit via Video Note ? ?I connected with Margaret Mccann on 01/12/22 at  4:00 PM EDT by a video enabled telemedicine application and verified that I am speaking with the correct person using two identifiers. ? ?Location: ?Patient: home ?Provider: offsite ?  ?I discussed the limitations of evaluation and management by telemedicine and the availability of in person appointments. The patient expressed understanding and agreed to proceed. ? ? ? ?  ?I discussed the assessment and treatment plan with the patient. The patient was provided an opportunity to ask questions and all were answered. The patient agreed with the plan and demonstrated an understanding of the instructions. ?  ?The patient was advised to call back or seek an in-person evaluation if the symptoms worsen or if the condition fails to improve as anticipated. ? ?I provided 5 minutes of non-face-to-face time during this encounter. ? ? ?Mcneil Sobericely Oliveah Zwack, NP  ? ?Chief Complaint: Medication management ? ?HPI: Margaret CabalCarol Mccann is a 51 year [facility]up psychiatric [facility].  She has a psychiatric history of major depressive disorder, PTSD polysubstance use and anxiety.  Her symptoms are managed with gabapentin 600 mg twice daily, Remeron 30 mg nightly, Pristiq 75 mg daily, Zyprexa 2.5 mg nightly as needed, and hydroxyzine 25 mg 3 times daily.  Patient reports that her medications are effective with managing her symptoms and that she is medication compliant.  Patient denies medication adverse effects or the need for dosage adjustment today.  Patient denies the need for refills today as she has recently received 3 refills on all medications from her PCP. ? ?Patient is alert and oriented x4, calm, pleasant and willing to engage.  She reports a good mood, sleep and appetite.  She denies  suicidal or homicidal ideations, paranoia, delusional thought, auditory or visual hallucinations. ? ? ?Visit Diagnosis:  ?  ICD-10-CM   ?1. PTSD (post-traumatic stress disorder)  F43.10   ?  ?2. 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  ? ? ?Past Psychiatric History:  ? ?Past Medical History:  ?Past Medical History:  ?Diagnosis Date  ? Alcohol withdrawal seizure with complication, with unspecified complication (HCC) 12/23/2020  ? Anxiety   ? Bipolar 1 disorder (HCC)   ? Depression   ? Intentional drug overdose (HCC) 08/04/2020  ? Major depressive disorder, recurrent episode with anxious distress (HCC) 08/11/2020  ? Morgellons syndrome   ? Suicide attempt [facility](HCC)   ? Suicide by drug overdose (HCC) 08/11/2020  ? Tricuspid valve regurgitation 12/18/2020  ?  ?Past Surgical History:  ?Procedure Laterality Date  ? APPENDECTOMY    ? ? ?Family Psychiatric History:  ? ?Family History:  ?Family History  ?Problem Relation Age of Onset  ? Heart attack Father [id number]  ? Stroke Father   ? Severe combined immunodeficiency Sister   ? Breast cancer Paternal Aunt   ? Ulcerative colitis Neg Hx   ? Esophageal cancer Neg Hx   ? ? ?Social History:  ?Social History  ? ?Socioeconomic History  ? Marital status: Single  ?  Spouse name: Not on file  ? Number of children: Not on file  ? Years of education: Not on file  ? Highest education level: Not on file  ?Occupational History  ? Not on file  ?Tobacco Use  ? Smoking status: Former  ?  Packs/day: 0.50  ?  Types: Cigarettes  ? Smokeless tobacco: Never  ?Vaping Use  ? Vaping Use: Never used  ?Substance and Sexual Activity  ? Alcohol use: Yes  ?  Comment: occas  ? Drug use: Not Currently  ?  Comment: not currently-was +cocaine and amphetamines, clean x3 years  ? Sexual activity: Yes  ?  Birth control/protection: Pill  ?Other Topics Concern  ? Not on file  ?Social History Narrative  ? ** Merged History Encounter **  ?    ? ?Social Determinants of Health   ? ?Financial Resource Strain: Not on file  ?Food Insecurity: Not on file  ?Transportation Needs: Not on file  ?Physical Activity: Not on file  ?Stress: Not on file  ?Social Connections: Not on file  ? ? ?Allergies:  ?Allergies  ?Allergen Reactions  ? Lamictal [Lamotrigine] Anaphylaxis, Rash and Other (See Comments)  ?  Stevens-Johnson syndrome and fevers, also  ? Lamictal [Lamotrigine] Anaphylaxis, Rash and Other (See Comments)  ?  Fevers and Stevens-Johnson syndrome, also  ? Tramadol Other (See Comments)  ?  Pt had a seizure after taking Tramadol!!  ? Geodon [Ziprasidone Hcl] Rash  ? Levetiracetam Anxiety  ? ? ?Metabolic Disorder Labs: ?Lab Results  ?Component Value Date  ? HGBA1C 6.1 (H) 12/19/2021  ? MPG 114.02 12/19/2020  ? ?No results found for: PROLACTIN ?Lab Results  ?Component Value Date  ? CHOL 183 05/16/2021  ? TRIG 118 05/16/2021  ? HDL 94 05/16/2021  ? CHOLHDL 1.9 05/16/2021  ? LDLCALC 69 05/16/2021  ? ?Lab Results  ?Component Value Date  ? TSH 1.300 05/16/2021  ? ? ?Therapeutic Level Labs: ?No results found for: LITHIUM ?No results found for: VALPROATE ?No components found for:  CBMZ ? ?Current Medications: ?Current Outpatient Medications  ?Medication Sig Dispense Refill  ? atorvastatin (LIPITOR) 20 MG tablet Take 1 tablet (20 mg total) by mouth at bedtime. 60 tablet 0  ? cloNIDine (CATAPRES) 0.2 MG tablet Take 1 tablet (0.2 mg total) by mouth at bedtime. 60 tablet 4  ? desvenlafaxine (PRISTIQ) 25 MG 24 hr tablet Take 3 tablets (75 mg total) by mouth daily. 90 tablet 3  ? desvenlafaxine (PRISTIQ) 25 MG 24 hr tablet Take 3 tablets by mouth once daily. 60 tablet 3  ? gabapentin (NEURONTIN) 600 MG tablet Take 2 tablets (1,200 mg total) by mouth 2 (two) times daily. 120 tablet 3  ? gabapentin (NEURONTIN) 600 MG tablet Take 2 tablets by mouth twice daily. 120 tablet 3  ? hydrOXYzine (ATARAX) 10 MG tablet Take 1 tablet (10 mg total) by mouth 3 (three) times daily as needed. 90 tablet 3  ? hydrOXYzine  (ATARAX) 10 MG tablet Take 1 tablet by mouth 3 times daily as needed. 270 tablet 0  ? mirtazapine (REMERON) 30 MG tablet Take 1 tablet (30 mg total) by mouth at bedtime. 30 tablet 3  ? mirtazapine (REMERON) 30 MG tablet Take 1 tablet by mouth at bedtime. 30 tablet 3  ? norethindrone (MICRONOR) 0.35 MG tablet Take 1 tablet (0.35 mg total) by mouth daily. 28 tablet 11  ? OLANZapine (ZYPREXA) 2.5 MG tablet Take 1 tablet (2.5 mg total) by mouth at bedtime as needed (insomnia/sleep). 30 tablet 3  ? OLANZapine (ZYPREXA) 2.5 MG tablet Take 1 tablet by mouth at bedtime as needed for insomnia or sleep. 30 tablet 3  ? pantoprazole (PROTONIX) 20 MG tablet Take 1 tablet (20 mg total) by mouth daily. 60 tablet 4  ? propranolol (INDERAL)  10 MG tablet Take 1 tablet (10 mg total) by mouth every morning. 60 tablet 0  ? ?No current facility-administered medications for this visit.  ? ? ? ?Musculoskeletal: ?Strength & Muscle Tone:  n/a virtual visit ?Gait & Station:  n/a ?Patient leans: N/A ? ?Psychiatric Specialty Exam: ?Review of Systems  ?Psychiatric/Behavioral:  Negative for hallucinations, self-injury and suicidal ideas.   ?All other systems reviewed and are negative.  ?Last menstrual period 10/27/2019.There is no height or weight on file to calculate BMI.  ?General Appearance: NA  ?Eye Contact:  NA  ?Speech:  Clear and Coherent  ?Volume:  Normal  ?Mood:  Euthymic  ?Affect:  Congruent  ?Thought Process:  Coherent  ?Orientation:  Full (Time, Place, and Person)  ?Thought Content: Logical   ?Suicidal Thoughts: No  ?Homicidal Thoughts: No  ?Memory: Good  ?Judgement: Good  ?Insight: Good  ?Psychomotor Activity:  NA  ?Concentration: Good  ?Recall: Good  ?Fund of Knowledge: Good  ?Language: Good  ?Akathisia: N/A  ?Handed: Right  ?AIMS (if indicated): not done  ?Assets:  Communication Skills  ?ADL's:  Intact  ?Cognition: WNL  ?Sleep:  Good  ? ?Screenings: ?AIMS   ? ?Flowsheet Row [facility]  ?AIMS Total Score 0  ? ?  ? ?AUDIT   ? ?Flowsheet Row [facility] Admission (Discharged) from 06/04/2018

## 2022-01-12 NOTE — ED Notes (Signed)
RT educated pt on smoking cessation. Discussed with pt the various options for quitting pt receptive to information and teaching. RT will continue to monitor. ?

## 2022-01-13 ENCOUNTER — Other Ambulatory Visit: Payer: Self-pay

## 2022-01-16 ENCOUNTER — Other Ambulatory Visit: Payer: Self-pay

## 2022-01-17 LAB — CULTURE, BLOOD (ROUTINE X 2)
Culture: NO GROWTH
Special Requests: ADEQUATE

## 2022-01-18 ENCOUNTER — Other Ambulatory Visit: Payer: Self-pay

## 2022-01-23 ENCOUNTER — Ambulatory Visit: Payer: Self-pay | Attending: Critical Care Medicine | Admitting: Internal Medicine

## 2022-01-23 ENCOUNTER — Encounter: Payer: Self-pay | Admitting: Internal Medicine

## 2022-01-23 ENCOUNTER — Other Ambulatory Visit (HOSPITAL_COMMUNITY)
Admission: RE | Admit: 2022-01-23 | Discharge: 2022-01-23 | Disposition: A | Discharge status: Home / Self Care | Payer: No Typology Code available for payment source | Source: Ambulatory Visit | Attending: Critical Care Medicine | Admitting: Critical Care Medicine

## 2022-01-23 ENCOUNTER — Other Ambulatory Visit: Payer: Self-pay

## 2022-01-23 ENCOUNTER — Ambulatory Visit: Payer: Self-pay | Admitting: Critical Care Medicine

## 2022-01-23 VITALS — BP 142/95 | HR 79 | Resp 22 | Ht 63.0 in | Wt 182.0 lb

## 2022-01-23 DIAGNOSIS — I10 Essential (primary) hypertension: Secondary | ICD-10-CM

## 2022-01-23 DIAGNOSIS — J40 Bronchitis, not specified as acute or chronic: Secondary | ICD-10-CM

## 2022-01-23 DIAGNOSIS — Z124 Encounter for screening for malignant neoplasm of cervix: Secondary | ICD-10-CM

## 2022-01-23 DIAGNOSIS — R7303 Prediabetes: Secondary | ICD-10-CM

## 2022-01-23 LAB — GLUCOSE, POCT (MANUAL RESULT ENTRY): POC Glucose: 168 mg/dl — AB (ref 70–99)

## 2022-01-23 MED ORDER — PREDNISONE 10 MG PO TABS
10.0000 mg | ORAL_TABLET | Freq: Every day | ORAL | 0 refills | Status: DC
  Filled 2022-01-23: qty 3, 3d supply, fill #0

## 2022-01-23 MED ORDER — GUAIFENESIN-CODEINE 100-10 MG/5ML PO SOLN
5.0000 mL | Freq: Three times a day (TID) | ORAL | 0 refills | Status: DC | PRN
  Filled 2022-01-23: qty 100, 7d supply, fill #0

## 2022-01-23 NOTE — Progress Notes (Signed)
? ? ?Patient ID: Margaret Mccann, female    DOB: 04/09/1971  MRN: [id number] ? ?CC: Gynecologic Exam and Cough ? ? ?Subjective: ?Margaret Mccann is a 51 y.o. female who presents for PAP.  PCP is Dr. Delford Field. ?Her concerns today include:  ?Patient with history of MDD, PTSD, HTN, HL, ? ?GYN History:  ?Pt is G2P2 ?Any hx of abn paps?: 1 about 10 yrs ago.  Had a LEEP ?Menses regular or irregular?:  menses stopped 1 yr ago ?How long does menses last?  NA ?Menstrual flow light or heavy?: NA ?Method of birth control?:  on Micronor for hot flashes which she takes helps ?Any vaginal dischg at this time?:  no ?Dysuria?:  no ?Any hx of STI?: no ?Sexually active with how many partners: not currently ?Desires STI screen: yes ?Last MMG: 11/2021 ?Family hx of uterine, cervical or breast cancer?:  no ? ?Seen in ER 01/12/2022 for cough and fever. ?Dx with bronchitis ?Placed on Doxycycline and Tessalon Perles.  Also given Albuterol inhaler.  Chest x-ray revealed no acute findings. ?Reports most of his symptoms have resolved except for very dry cough.  Chest is sore from coughing.  Called out of work 3 days because not getting in any rest from cough. ? ?Blood pressure noted to be elevated today.  Reports compliance with propranolol and clonidine.  Reports that her blood pressure at home is good.  She feels it is elevated today because of the coughing. ?Patient Active Problem List  ? Diagnosis Date Noted  ? Class 1 obesity with body mass index (BMI) of 32.0 to 32.9 in adult 12/19/2021  ? Chronic knee pain 12/19/2021  ? Dental caries 12/19/2021  ? Symptoms, such as flushing, sleeplessness, headache, lack of concentration, associated with the menopause 12/19/2021  ? Essential hypertension 03/16/2021  ? Mixed hyperlipidemia 03/16/2021  ? PTSD (post-traumatic stress disorder) 07/30/2020  ? Major depressive disorder, recurrent severe without psychotic features (HCC) 06/04/2018  ?  ? ?Current Outpatient Medications on File Prior to  Visit  ?Medication Sig Dispense Refill  ? atorvastatin (LIPITOR) 20 MG tablet Take 1 tablet (20 mg total) by mouth at bedtime. 60 tablet 0  ? benzonatate (TESSALON PERLES) 100 MG capsule Take 1 capsule (100 mg total) by mouth every 6 (six) hours as needed for cough. 30 capsule 0  ? cloNIDine (CATAPRES) 0.2 MG tablet Take 1 tablet (0.2 mg total) by mouth at bedtime. 60 tablet 4  ? desvenlafaxine (PRISTIQ) 25 MG 24 hr tablet Take 3 tablets (75 mg total) by mouth daily. 90 tablet 3  ? desvenlafaxine (PRISTIQ) 25 MG 24 hr tablet Take 3 tablets by mouth once daily. 60 tablet 3  ? doxycycline (VIBRAMYCIN) 100 MG capsule Take 1 capsule (100 mg total) by mouth 2 (two) times daily. 20 capsule 0  ? gabapentin (NEURONTIN) 600 MG tablet Take 2 tablets (1,200 mg total) by mouth 2 (two) times daily. 120 tablet 3  ? gabapentin (NEURONTIN) 600 MG tablet Take 2 tablets by mouth twice daily. 120 tablet 3  ? hydrOXYzine (ATARAX) 10 MG tablet Take 1 tablet (10 mg total) by mouth 3 (three) times daily as needed. 90 tablet 3  ? hydrOXYzine (ATARAX) 10 MG tablet Take 1 tablet by mouth 3 times daily as needed. 270 tablet 0  ? mirtazapine (REMERON) 30 MG tablet Take 1 tablet (30 mg total) by mouth at bedtime. 30 tablet 3  ? mirtazapine (REMERON) 30 MG tablet Take 1 tablet by mouth at bedtime.  30 tablet 3  ? norethindrone (MICRONOR) 0.35 MG tablet Take 1 tablet (0.35 mg total) by mouth daily. 28 tablet 11  ? OLANZapine (ZYPREXA) 2.5 MG tablet Take 1 tablet (2.5 mg total) by mouth at bedtime as needed (insomnia/sleep). 30 tablet 3  ? OLANZapine (ZYPREXA) 2.5 MG tablet Take 1 tablet by mouth at bedtime as needed for insomnia or sleep. 30 tablet 3  ? pantoprazole (PROTONIX) 20 MG tablet Take 1 tablet (20 mg total) by mouth daily. 60 tablet 4  ? propranolol (INDERAL) 10 MG tablet Take 1 tablet (10 mg total) by mouth every morning. 60 tablet 0  ? ?No current facility-administered medications on file prior to visit.  ? ? ?Allergies  ?Allergen  Reactions  ? Lamictal [Lamotrigine] Anaphylaxis, Rash and Other (See Comments)  ?  Stevens-Areana Kosanke syndrome and fevers, also  ? Lamictal [Lamotrigine] Anaphylaxis, Rash and Other (See Comments)  ?  Fevers and Stevens-Jayelyn Barno syndrome, also  ? Tramadol Other (See Comments)  ?  Pt had a seizure after taking Tramadol!!  ? Geodon [Ziprasidone Hcl] Rash  ? Levetiracetam Anxiety  ? ? ?Social History  ? ?Socioeconomic History  ? Marital status: Single  ?  Spouse name: Not on file  ? Number of children: Not on file  ? Years of education: Not on file  ? Highest education level: Not on file  ?Occupational History  ? Not on file  ?Tobacco Use  ? Smoking status: Some Days  ?  Packs/day: 0.50  ?  Types: Cigarettes  ? Smokeless tobacco: Never  ?Vaping Use  ? Vaping Use: Never used  ?Substance and Sexual Activity  ? Alcohol use: Yes  ?  Comment: occas  ? Drug use: Not Currently  ?  Comment: not currently-was +cocaine and amphetamines, clean x3 years  ? Sexual activity: Not Currently  ?  Birth control/protection: Pill  ?Other Topics Concern  ? Not on file  ?Social History Narrative  ? ** Merged History Encounter **  ?    ? ?Social Determinants of Health  ? ?Financial Resource Strain: Not on file  ?Food Insecurity: Not on file  ?Transportation Needs: Not on file  ?Physical Activity: Not on file  ?Stress: Not on file  ?Social Connections: Not on file  ?Intimate Partner Violence: Not on file  ? ? ?Family History  ?Problem Relation Age of Onset  ? Heart attack Father 45  ? Stroke Father   ? Severe combined immunodeficiency Sister   ? Breast cancer Paternal Aunt   ? Ulcerative colitis Neg Hx   ? Esophageal cancer Neg Hx   ? ? ?Past Surgical History:  ?Procedure Laterality Date  ? APPENDECTOMY    ? ? ?ROS: ?Review of Systems ?Negative except as stated above ? ?PHYSICAL EXAM: ?BP (!) 142/95 (BP Location: Left Arm, Patient Position: Sitting, Cuff Size: Large)   Pulse 79   Resp (!) 22   Ht 5\' 3"  (1.6 m)   Wt 182 lb (82.6 kg)   LMP  10/27/2019   SpO2 97%   BMI 32.24 kg/m?   ?Physical Exam ? ?General appearance - alert, well appearing, and in no distress ?Mental status - normal mood, behavior, speech, dress, motor activity, and thought processes ?Mouth - mucous membranes moist, pharynx normal without lesions ?Chest -patient with spasms of coughing in my presence.  Little rhonchi at the bases.  Other lung fields are clear.  No wheezes. ?Heart - normal rate, regular rhythm, normal S1, S2, no murmurs, rubs, clicks or gallops ?  Pelvic -RN Tonna CornerLily Reppert present:   normal external genitalia, vulva, vagina, cervix, uterus and adnexa ? ? ? ?  Latest Ref Rng & Units 01/12/2022  ?  6:50 PM 11/11/2021  ? 11:04 AM 08/01/2021  ? 12:58 PM  ?CMP  ?Glucose 70 - 99 mg/dL 87   161118   83    ?BUN 6 - 20 mg/dL 8   10   20     ?Creatinine 0.44 - 1.00 mg/dL 0.960.86   0.450.73   4.090.64    ?Sodium 135 - 145 mmol/L 136   136   136    ?Potassium 3.5 - 5.1 mmol/L 4.1   4.5   4.2    ?Chloride 98 - 111 mmol/L 102   99   104    ?CO2 22 - 32 mmol/L 22   22   21     ?Calcium 8.9 - 10.3 mg/dL 81.110.2   91.410.3   9.4    ?Total Protein 6.5 - 8.1 g/dL 8.2    8.1    ?Total Bilirubin 0.3 - 1.2 mg/dL 0.4    0.4    ?Alkaline Phos 38 - 126 U/L 101    88    ?AST 15 - 41 U/L 30    24    ?ALT 0 - 44 U/L 54    33    ? ?Lipid Panel  ?   ?Component Value Date/Time  ? CHOL 183 05/16/2021 0930  ? TRIG 118 05/16/2021 0930  ? HDL 94 05/16/2021 0930  ? CHOLHDL 1.9 05/16/2021 0930  ? LDLCALC 69 05/16/2021 0930  ? ? ?CBC ?   ?Component Value Date/Time  ? WBC 9.3 01/12/2022 1850  ? RBC 4.67 01/12/2022 1850  ? HGB 14.0 01/12/2022 1850  ? HGB 13.4 05/16/2021 0930  ? HCT 42.2 01/12/2022 1850  ? HCT 39.7 05/16/2021 0930  ? PLT 301 01/12/2022 1850  ? PLT 358 05/16/2021 0930  ? MCV 90.4 01/12/2022 1850  ? MCV 89 05/16/2021 0930  ? MCH 30.0 01/12/2022 1850  ? MCHC 33.2 01/12/2022 1850  ? RDW 13.0 01/12/2022 1850  ? RDW 13.4 05/16/2021 0930  ? LYMPHSABS 1.7 01/12/2022 1850  ? LYMPHSABS 3.1 05/16/2021 0930  ? MONOABS 0.7  01/12/2022 1850  ? EOSABS 0.2 01/12/2022 1850  ? EOSABS 0.2 05/16/2021 0930  ? BASOSABS 0.0 01/12/2022 1850  ? BASOSABS 0.0 05/16/2021 0930  ? ?Results for orders placed or performed in visit on 01/23/22  ?POCT glucose (man

## 2022-01-24 LAB — CERVICOVAGINAL ANCILLARY ONLY
Bacterial Vaginitis (gardnerella): NEGATIVE
Candida Glabrata: NEGATIVE
Candida Vaginitis: NEGATIVE
Chlamydia: NEGATIVE
Comment: NEGATIVE
Comment: NEGATIVE
Comment: NEGATIVE
Comment: NEGATIVE
Comment: NEGATIVE
Comment: NORMAL
Neisseria Gonorrhea: NEGATIVE
Trichomonas: NEGATIVE

## 2022-01-25 LAB — CYTOLOGY - PAP
Adequacy: ABSENT
Comment: NEGATIVE
Diagnosis: NEGATIVE
High risk HPV: NEGATIVE

## 2022-02-06 ENCOUNTER — Other Ambulatory Visit: Payer: Self-pay

## 2022-02-07 ENCOUNTER — Other Ambulatory Visit: Payer: Self-pay

## 2022-02-16 ENCOUNTER — Other Ambulatory Visit: Payer: Self-pay

## 2022-02-27 ENCOUNTER — Other Ambulatory Visit: Payer: Self-pay

## 2022-02-28 ENCOUNTER — Other Ambulatory Visit: Payer: Self-pay

## 2022-03-03 ENCOUNTER — Other Ambulatory Visit: Payer: Self-pay

## 2022-03-08 ENCOUNTER — Other Ambulatory Visit: Payer: Self-pay | Admitting: Critical Care Medicine

## 2022-03-08 ENCOUNTER — Other Ambulatory Visit: Payer: Self-pay

## 2022-03-08 DIAGNOSIS — F4310 Post-traumatic stress disorder, unspecified: Secondary | ICD-10-CM

## 2022-03-08 MED ORDER — PROPRANOLOL HCL 10 MG PO TABS
10.0000 mg | ORAL_TABLET | Freq: Every morning | ORAL | 1 refills | Status: DC
  Filled 2022-03-08: qty 30, 30d supply, fill #0
  Filled 2022-04-02: qty 30, 30d supply, fill #1

## 2022-03-09 ENCOUNTER — Ambulatory Visit: Payer: Self-pay | Admitting: Internal Medicine

## 2022-03-15 ENCOUNTER — Other Ambulatory Visit: Payer: Self-pay | Admitting: Critical Care Medicine

## 2022-03-15 ENCOUNTER — Other Ambulatory Visit: Payer: Self-pay

## 2022-03-15 DIAGNOSIS — E782 Mixed hyperlipidemia: Secondary | ICD-10-CM

## 2022-03-15 MED ORDER — ATORVASTATIN CALCIUM 20 MG PO TABS
20.0000 mg | ORAL_TABLET | Freq: Every day | ORAL | 1 refills | Status: DC
  Filled 2022-03-15: qty 30, 30d supply, fill #0
  Filled 2022-04-11: qty 30, 30d supply, fill #1

## 2022-03-16 ENCOUNTER — Other Ambulatory Visit: Payer: Self-pay

## 2022-03-21 ENCOUNTER — Other Ambulatory Visit: Payer: Self-pay

## 2022-03-21 MED ORDER — AMOXICILLIN 500 MG PO CAPS
ORAL_CAPSULE | ORAL | 0 refills | Status: DC
  Filled 2022-03-21: qty 30, 10d supply, fill #0

## 2022-03-22 ENCOUNTER — Ambulatory Visit: Payer: Self-pay

## 2022-03-22 MED ORDER — IBUPROFEN 600 MG PO TABS: 600.0000 mg | ORAL_TABLET | Freq: Three times a day (TID) | ORAL | 0 refills | Status: DC | PRN

## 2022-03-22 NOTE — Telephone Encounter (Signed)
Just now seeing this, do not recommend opiates due to her prior mental health conditions, I sent a prescription for high-dose ibuprofen

## 2022-03-22 NOTE — Telephone Encounter (Signed)
    Chief Complaint: Had tooth extraction today and is having severe pain. The dental office is not calling her back.Asking for pain medicine to be called in to [facility] on [address] Frequency: Today Pertinent Negatives: Patient denies  Disposition: [] ED /[] Urgent Care (no appt availability in office) / [] Appointment(In office/virtual)/ []  [facility]/ [] Home Care/ [] Refused Recommended Disposition /[] [facility]/ [x]  Follow-up with PCP Additional Notes: Please advise pt.  Answer Assessment - Initial Assessment Questions 1. LOCATION: "Which tooth is hurting?"  (e.g., right-side/left-side, upper/lower, front/back)     Tooth pulled  - upper left back 2. ONSET: "When did the toothache start?"  (e.g., hours, days)      Today 3. SEVERITY: "How bad is the toothache?"  (Scale 1-10; mild, moderate or severe)   - MILD (1-3): doesn't interfere with chewing    - MODERATE (4-7): interferes with chewing, interferes with normal activities, awakens from sleep     - SEVERE (8-10): unable to eat, unable to do any normal activities, excruciating pain        Severe 4. SWELLING: "Is there any visible swelling of your face?"     Yes 5. OTHER SYMPTOMS: "Do you have any other symptoms?" (e.g., fever)     Pain 6. PREGNANCY: "Is there any chance you are pregnant?" "When was your last menstrual period?"     No  Protocols used: Toothache-A-AH

## 2022-03-22 NOTE — Telephone Encounter (Signed)
Routing to PCP for review.

## 2022-03-23 ENCOUNTER — Telehealth (INDEPENDENT_AMBULATORY_CARE_PROVIDER_SITE_OTHER): Payer: No Payment, Other | Admitting: Psychiatry

## 2022-03-23 ENCOUNTER — Other Ambulatory Visit: Payer: Self-pay

## 2022-03-23 ENCOUNTER — Telehealth: Payer: Self-pay | Admitting: Critical Care Medicine

## 2022-03-23 ENCOUNTER — Encounter (HOSPITAL_COMMUNITY): Payer: Self-pay | Admitting: Psychiatry

## 2022-03-23 DIAGNOSIS — K0889 Other specified disorders of teeth and supporting structures: Secondary | ICD-10-CM

## 2022-03-23 DIAGNOSIS — F3341 Major depressive disorder, recurrent, in partial remission: Secondary | ICD-10-CM

## 2022-03-23 DIAGNOSIS — F4310 Post-traumatic stress disorder, unspecified: Secondary | ICD-10-CM | POA: Diagnosis not present

## 2022-03-23 MED ORDER — HYDROXYZINE HCL 25 MG PO TABS
25.0000 mg | ORAL_TABLET | Freq: Three times a day (TID) | ORAL | 3 refills | Status: DC | PRN
Start: 2022-03-23 — End: 2022-06-15
  Filled 2022-03-23: qty 90, 30d supply, fill #0
  Filled 2022-04-16: qty 90, 30d supply, fill #1
  Filled 2022-05-15: qty 90, 30d supply, fill #2
  Filled 2022-06-09: qty 90, 30d supply, fill #3

## 2022-03-23 MED ORDER — OLANZAPINE 2.5 MG PO TABS
2.5000 mg | ORAL_TABLET | Freq: Every evening | ORAL | 3 refills | Status: DC | PRN
  Filled 2022-03-23: qty 30, 30d supply, fill #0
  Filled 2022-04-16: qty 30, 30d supply, fill #1
  Filled 2022-05-15: qty 30, 30d supply, fill #2
  Filled 2022-06-09: qty 30, 30d supply, fill #3

## 2022-03-23 MED ORDER — DESVENLAFAXINE SUCCINATE ER 25 MG PO TB24
75.0000 mg | ORAL_TABLET | Freq: Every day | ORAL | 3 refills | Status: DC
  Filled 2022-03-23: qty 90, 30d supply, fill #0
  Filled 2022-04-16: qty 60, 20d supply, fill #1
  Filled 2022-05-03: qty 60, 20d supply, fill #2
  Filled 2022-05-29: qty 90, 30d supply, fill #3
  Filled 2022-06-07: qty 60, 20d supply, fill #3

## 2022-03-23 MED ORDER — MIRTAZAPINE 30 MG PO TABS
30.0000 mg | ORAL_TABLET | Freq: Every day | ORAL | 3 refills | Status: DC
  Filled 2022-03-23 – 2022-04-11 (×2): qty 30, 30d supply, fill #0
  Filled 2022-05-08: qty 30, 30d supply, fill #1
  Filled 2022-06-05: qty 30, 30d supply, fill #2

## 2022-03-23 MED ORDER — GABAPENTIN 600 MG PO TABS
1200.0000 mg | ORAL_TABLET | Freq: Two times a day (BID) | ORAL | 3 refills | Status: DC
  Filled 2022-03-23 – 2022-04-02 (×2): qty 120, 30d supply, fill #0
  Filled 2022-05-01 – 2022-05-03 (×2): qty 120, 30d supply, fill #1
  Filled 2022-05-29: qty 120, 30d supply, fill #2

## 2022-03-23 NOTE — Progress Notes (Signed)
BH MD/PA/NP OP Progress Note Virtual Visit via Video Note  I connected with Margaret Mccann Margaret Mccann on 03/23/22 at 10:00 AM EDT by a video enabled telemedicine application and verified that I am speaking with the correct person using two identifiers.  Location: Patient: Work Provider: Clinic   I discussed the limitations of evaluation and management by telemedicine and the availability of in person appointments. The patient expressed understanding and agreed to proceed.  I provided 30 minutes of non-face-to-face time during this encounter.   03/23/2022 10:09 AM Margaret Mccann Margaret Mccann  MRN:  [id number]  Chief Complaint: "I am doing really well"  [employer]  HPI: 51 year old female seen today for follow-up psychiatric evaluation.   She has a psychiatric history of depression, anxiety, PTSD, and substance use (cocaine, amphetamine use, stimulants, and alcohol).  She is managed on gabapentin 600 mg twice daily, Remeron 30 mg nightly, Pristiq 75 mg daily, Zyprexa 2.5 mg nightly (takes as needed), and hydroxyzine 25 mg 3 times daily. She is also prescribed clonidine and propanolol by her PCP. She notes her medications are effective in managing her psychiatric conditions.  Today she is well-groomed, pleasant, cooperative, engaged in conversation, maintained eye contact.  She informed provider that she has been doing really well.  She notes that for the last months she has been working at a new job [employer] ). She reports that she finds enjoyment in her job.  Patient notes her mood is stable and notes that she has minimal anxiety and depression.  Today provider conducted a GAD-7 and patient scored a 2.  Provider also conducted PHQ-9 he scored a 1.  She endorses adequate sleep and appetite.  Today she denies SI/HI/VAH, mania, paranoia.      No medication changes made today.  Patient agreeable to continue medication as prescribed.  No other concerns noted at this  time.     Visit Diagnosis:    ICD-10-CM   1. Major depressive disorder, recurrent episode, in partial remission with anxious distress (HCC)  F33.41 gabapentin (NEURONTIN) 600 MG tablet    mirtazapine (REMERON) 30 MG tablet    OLANZapine (ZYPREXA) 2.5 MG tablet    2. PTSD (post-traumatic stress disorder)  F43.10 mirtazapine (REMERON) 30 MG tablet      Past Psychiatric History:  MDD , anxiety, PTSD, Substance abuse (cocaine, alcohol)  Past Medical History:  Past Medical History:  Diagnosis Date   Alcohol withdrawal seizure with complication, with unspecified complication (HCC) 12/23/2020   Anxiety    Bipolar 1 disorder (HCC)    Depression    Intentional drug overdose (HCC) 08/04/2020   Major depressive disorder, recurrent episode with anxious distress (HCC) 08/11/2020   Morgellons syndrome    MRSA (methicillin resistant Staphylococcus aureus)    Suicide attempt (HCC)    Suicide by drug overdose (HCC) 08/11/2020   Tricuspid valve regurgitation 12/18/2020    Past Surgical History:  Procedure Laterality Date   APPENDECTOMY      Family Psychiatric History: Sister - committed suicide in 2005 by overdosing.  Family History:  Family History  Problem Relation Age of Onset   Heart attack Father 33   Stroke Father    Severe combined immunodeficiency Sister    Breast cancer Paternal Aunt    Ulcerative colitis Neg Hx    Esophageal cancer Neg Hx     Social History:  Social History   Socioeconomic History   Marital status: Single    Spouse  name: Not on file   Number of children: Not on file   Years of education: Not on file   Highest education level: Not on file  Occupational History   Not on file  Tobacco Use   Smoking status: Some Days    Packs/day: 0.50    Types: Cigarettes   Smokeless tobacco: Never  Vaping Use   Vaping Use: Never used  Substance and Sexual Activity   Alcohol use: Yes    Comment: occas   Drug use: Not Currently    Comment: not currently-was  +cocaine and amphetamines, clean x3 years   Sexual activity: Not Currently    Birth control/protection: Pill  Other Topics Concern   Not on file  Social History Narrative   ** Merged History Encounter **       Social Determinants of Health   Financial Resource Strain: Not on file  Food Insecurity: Not on file  Transportation Needs: Not on file  Physical Activity: Not on file  Stress: Not on file  Social Connections: Not on file    Allergies:  Allergies  Allergen Reactions   Lamictal [Lamotrigine] Anaphylaxis, Rash and Other (See Comments)    Stevens-Johnson syndrome and fevers, also   Lamictal [Lamotrigine] Anaphylaxis, Rash and Other (See Comments)    Fevers and Stevens-Johnson syndrome, also   Tramadol Other (See Comments)    Pt had a seizure after taking Tramadol!!   Geodon [Ziprasidone Hcl] Rash   Levetiracetam Anxiety    Metabolic Disorder Labs: Lab Results  Component Value Date   HGBA1C 6.1 (H) 12/19/2021   MPG 114.02 12/19/2020   No results found for: "PROLACTIN" Lab Results  Component Value Date   CHOL 183 05/16/2021   TRIG 118 05/16/2021   HDL 94 05/16/2021   CHOLHDL 1.9 05/16/2021   LDLCALC 69 05/16/2021   Lab Results  Component Value Date   TSH 1.300 05/16/2021    Therapeutic Level Labs: No results found for: "LITHIUM" No results found for: "VALPROATE" No results found for: "CBMZ"  Current Medications: Current Outpatient Medications  Medication Sig Dispense Refill   propranolol (INDERAL) 10 MG tablet Take 1 tablet (10 mg total) by mouth once every morning. 30 tablet 1   amoxicillin (AMOXIL) 500 MG capsule Take 1 capsule by mouth 3 times daily until gone. 30 capsule 0   atorvastatin (LIPITOR) 20 MG tablet Take 1 tablet (20 mg total) by mouth at bedtime. 30 tablet 1   benzonatate (TESSALON PERLES) 100 MG capsule Take 1 capsule (100 mg total) by mouth every 6 (six) hours as needed for cough. 30 capsule 0   cloNIDine (CATAPRES) 0.2 MG tablet Take  1 tablet (0.2 mg total) by mouth at bedtime. 60 tablet 4   desvenlafaxine (PRISTIQ) 25 MG 24 hr tablet Take 3 tablets (75 mg total) by mouth daily. 90 tablet 3   doxycycline (VIBRAMYCIN) 100 MG capsule Take 1 capsule (100 mg total) by mouth 2 (two) times daily. 20 capsule 0   gabapentin (NEURONTIN) 600 MG tablet Take 2 tablets (1,200 mg total) by mouth 2 (two) times daily. 120 tablet 3   guaiFENesin-codeine 100-10 MG/5ML syrup Take 5 mLs by mouth 3 (three) times daily as needed for cough. 100 mL 0   hydrOXYzine (ATARAX) 25 MG tablet Take 1 tablet (25 mg total) by mouth 3 (three) times daily as needed. 90 tablet 3   ibuprofen (ADVIL) 600 MG tablet Take 1 tablet (600 mg total) by mouth every 8 (eight) hours as needed.  30 tablet 0   mirtazapine (REMERON) 30 MG tablet Take 1 tablet (30 mg total) by mouth at bedtime. 30 tablet 3   norethindrone (MICRONOR) 0.35 MG tablet Take 1 tablet (0.35 mg total) by mouth daily. 28 tablet 11   OLANZapine (ZYPREXA) 2.5 MG tablet Take 1 tablet (2.5 mg total) by mouth at bedtime as needed (insomnia/sleep). 30 tablet 3   pantoprazole (PROTONIX) 20 MG tablet Take 1 tablet (20 mg total) by mouth daily. 60 tablet 4   predniSONE (DELTASONE) 10 MG tablet Take 1 tablet (10 mg total) by mouth daily with breakfast. 3 tablet 0   No current facility-administered medications for this visit.     Musculoskeletal: Strength & Muscle Tone: within normal limits, telehealth visit Gait & Station: normal, telehealth visit Patient leans: N/A  Psychiatric Specialty Exam: Review of Systems  Last menstrual period 10/27/2019.There is no height or weight on file to calculate BMI.  General Appearance: Well Groomed  Eye Contact:  Good  Speech:  Clear and Coherent and Normal Rate  Volume:  Normal  Mood:  Euthymic  Affect:  Appropriate and Congruent  Thought Process:  Coherent, Goal Directed, and Linear  Orientation:  Full (Time, Place, and Person)  Thought Content: WDL and Logical    Suicidal Thoughts:  No  Homicidal Thoughts:  No  Memory:  Immediate;   Good Recent;   Good Remote;   Good  Judgement:  Good  Insight:  Good  Psychomotor Activity:  Normal  Concentration:  Concentration: Good and Attention Span: Good  Recall:  Good  Fund of Knowledge: Good  Language: Good  Akathisia:  No  Handed:  Right  AIMS (if indicated): not done  Assets:  Communication Skills Desire for Improvement Financial Resources/Insurance Housing  ADL's:  Intact  Cognition: WNL  Sleep:  Good   Screenings: AIMS    Flowsheet Row [facility]  AIMS Total Score 0      AUDIT    Flowsheet Row [facility]) [facility] Ad[facility] ([facility]  Alcohol Use Disorder Identification Test Final Score (AUDIT) 11 0      GAD-7    Flowsheet Row Video Visit from 03/23/2022 in [facility] Office Visit from 01/23/2022 in [facility] Office Visit from 12/19/2021 in [facility] Office Visit from 10/24/2021 in [facility] Video Visit from 08/09/2021 in [facility]  Total GAD-7 Score 2 3 0 4 2      PHQ2-9    Flowsheet Row Video Visit from 03/23/2022 in [facility] Office Visit from 01/23/2022 in [facility] Office Visit from 12/19/2021 in [facility] Office Visit from 10/24/2021 in [facility] Video Visit from 08/09/2021 in [facility]  PHQ-2 Total Score 0 0 0 1 0  PHQ-9 Total Score 1 6 -- 3 1      Flowsheet Row ED from 01/12/2022 in [facility] Video Visit from 08/09/2021 in [facility] ED from 08/05/2021 in [facility]  C-SSRS RISK CATEGORY No Risk No Risk No Risk        Assessment and Plan: Patient notes that she is doing well on her current  medication regimen. No medication changes made today.  Patient agreeable to continue medication as prescribed.   1. Major depressive disorder, recurrent episode, in partial remission with anxious distress (HCC)  Continue- gabapentin (NEURONTIN) 600 MG tablet; Take 2 tablets (1,200 mg total) by mouth 2 (two) times daily.  Dispense: 120 tablet; Refill: 3 Continue- mirtazapine (REMERON) 30 MG tablet; Take 1 tablet (30 mg total) by mouth at bedtime.  Dispense: 30 tablet; Refill: 3 Continue- desvenlafaxine (PRISTIQ) 50 MG 24 hr tablet; Take one and a half tablets daily (75 mg)  Dispense: 45 tablet; Refill: 3 Continue- OLANZapine (ZYPREXA) 2.5 MG tablet; Take 1 tablet (2.5 mg total) by mouth at bedtime as needed (insomnia/sleep).  Dispense: 15 tablet; Refill: 3  2. PTSD (post-traumatic stress disorder)  Continue- hydrOXYzine (ATARAX/VISTARIL) 25 MG tablet; Take 2 tablets twice daily as needed for anxiety and take 2 tablets at bedtime as needed for sleep  Dispense: 120 tablet; Refill: 3 Continue- mirtazapine (REMERON) 30 MG tablet; Take 1 tablet (30 mg total) by mouth at bedtime.  Dispense: 30 tablet; Refill: 3 Continue- desvenlafaxine (PRISTIQ) 50 MG 24 hr tablet; Take one and a half tablets daily (75 mg)  Dispense: 45 tablet; Refill: 3    Follow up in 3 months  Shanna Cisco, NP 03/23/2022, 10:09 AM

## 2022-03-23 NOTE — Telephone Encounter (Signed)
Pt was called and informed of medication being sent to pharmacy. Pt states that she was already taking ibuprofen and medication was not working. Pt informed that PCP will not send in any other medication.

## 2022-03-23 NOTE — Telephone Encounter (Signed)
Copied from CRM [id number]. Topic: General - Other >> Mar 22, 2022  5:34 PM Lynford Citizen wrote: Reason for CRM: Pt just spoke to NT today 6/28 and is calling to follow up if Dr Delford Field will be able to send anything in for her tooth pain, please advise.

## 2022-03-24 ENCOUNTER — Other Ambulatory Visit: Payer: Self-pay

## 2022-03-27 NOTE — Telephone Encounter (Signed)
Referral sent again.

## 2022-03-27 NOTE — Telephone Encounter (Signed)
In clarify I read the message wrong, but pt has tooth already pulled and was given medication already no other question or concerns at this time

## 2022-03-27 NOTE — Telephone Encounter (Signed)
Pt requesting another tooth referral, last one is closed

## 2022-04-03 ENCOUNTER — Other Ambulatory Visit: Payer: Self-pay

## 2022-04-04 ENCOUNTER — Other Ambulatory Visit: Payer: Self-pay

## 2022-04-06 ENCOUNTER — Telehealth (HOSPITAL_COMMUNITY): Payer: No Payment, Other | Admitting: Psychiatry

## 2022-04-12 ENCOUNTER — Other Ambulatory Visit: Payer: Self-pay

## 2022-04-17 ENCOUNTER — Other Ambulatory Visit: Payer: Self-pay

## 2022-04-18 ENCOUNTER — Other Ambulatory Visit: Payer: Self-pay

## 2022-04-23 NOTE — Progress Notes (Signed)
Established Patient Office Visit  Subjective:  Patient ID: Margaret Mccann, female    DOB: December 16, 1970  Age: 51 y.o. MRN: [id number]  CC: primary care follow up , loss of income/job for affording medications  HPI  04/2021   Referred for HTN mgmt and PCP to est by [facility] [name] This patient has a history of PTSD substance use prior suicide attempts, major depression and severe anxiety.  The patient was hospitalized in March with hypoxic respiratory failure and polysubstance overdose.  Patient then went to rehab for a 10-monthinterval.  Since that time she is in a better mental health state.  [facility] [place].  She has not had a primary care provider in recent times.  She states that clonidine helps her PTSD and is requesting refills on this and propranolol.  Today on arrival blood pressure is 119/58 pulse 76 and she is already on the clonidine 0.2 mg daily.  She was also given a short-term refill on Zyprexa which she takes at bedtime to help with sleep as needed.  She is requesting refills on this as well.   Note during the March hospitalization the patient had an echocardiogram that showed significant tricuspid regurgitation but normal right heart pressures  Patient does have a gynecologist did have a Pap smear over 3 years ago and is now becoming due.  Patient does need hepatitis C screening.  Note patient's not actively suicidal at this time.  She does have reflux symptoms on a daily basis.  She is on over-the-counter Nexium is requesting a prescription for Protonix.  Patient is due a mammogram.  Patient's mental health stressors are that her fianc hung himself in their home last fall.  Also her sister committed suicide in October.  This resulted in significant issues for this patient.  10/24/21 Since the last visit the patient unfortunately has lost her job and cannot afford to pay for her medications.  She does not have  any insurance.  She cannot afford any of the [facility]  On arrival blood pressure 111/76 she is doing well on the clonidine twice daily.  Her PHQ-9 and GAD-7 are mildly elevated.  She is not suicidal.  She tried to go see cardiology but could not make the appointment because her boss would not let her off work for this visit.  Also she was unable to attend a mammogram appointment twice because of her job.  She also needs colon cancer screening.  Note on arrival she did receive the flu vaccine  The patient has no other complaints  3/27 Patient returns in follow-up and complains of bilateral knee pain and progressive weight gain.  She normally weighs in the 120 range now she is 182 with a BMI of 32.  She had been seen by cardiology had a negative coronary CT and echocardiogram was unremarkable  The patient has been seen by cardiology assessment is as below Tricuspid valve insufficiency, unspecified etiology   Essential hypertension   Mixed hyperlipidemia   Chest pain of uncertain etiology   Dyspnea, unspecified type       PLAN:     In order of problems listed above:   1.  I reviewed her echocardiogram from 2022 which does demonstrate good deal of tricuspid regurgitation.  I will repeat an echocardiogram now.  We may need to perform a right heart catheterization depending on these results.  Follow-up in 1 year or  earlier if needed 2.  Blood pressure is at goal today. 3.  This is being followed by her primary care provider. 4.  We will obtain coronary CTA.  This would also inform the need for aspirin. 5.  We will obtain echocardiogram to evaluate.  I have a feeling this is due to her 60 pound weight gain.   Coronary CT was normal and echocardiogram was normal blood pressure remains at goal on clonidine.  Mental health is stable.  She does have bilateral knee pain.  She does not eat breakfast she takes protein shakes with nuts for lunch she eats salads chicken breast sweet  potatoes for dinner she is not getting much fresh fruits or vegetables in her diet.  She does walk daily for about an hour with her dog which is a lab or doodle.  She would like diabetes screening at this visit.  She complains of fractured left molar in the left lower jaw.  She would like a dental exam.  She now has the orange card and blue card.  She still has reflux if she overeats at night late.  She has not had a period in 1 year and she is sleeping better since she is in recovery.  She is questioning whether she could obtain progesterone for menopause.     Recent mammogram was normal.  She has a Pap smear appointment upcoming.  There are no other primary care gaps.  7/31 Patient seen today in return follow-up she has had continued weight gain she is up to 183 pounds at a BMI of 32-1/2.  Blood pressure on arrival elevated 137/95.  It is elevated on recheck.  The patient is trying to eat a lifestyle medicine diet but is still eating quite a bit of carbohydrates and processed foods with the diet.  She is still smoking 3 cigarettes daily.  She does have prediabetes with an A1c of 6.1 blood sugar 168.  The patient does not have any other complaints at this time.  Her mental health is improved.  She does need refills on multiple medications.  The patient does maintain for hypertension control clonidine 0.2 mg daily and propranolol 10 mg daily she is taking the Micronor and is tolerating this well with decreased excess menstruations.  Mental health is following and managing all her mental health medications which include the hydroxyzine mirtazapine and olanzapine and gabapentin along with Pristiq.   Past Medical History:  Diagnosis Date   Alcohol withdrawal seizure with complication, with unspecified complication ([place]) 26/33/3545   Anxiety    Bipolar 1 disorder ([place])    Depression    Intentional drug overdose ([place]) 08/04/2020   Major depressive disorder, recurrent episode with anxious distress ([place])  08/11/2020   Morgellons syndrome    MRSA (methicillin resistant Staphylococcus aureus)    Suicide attempt ([place])    Suicide by drug overdose ([place]) 08/11/2020   Tricuspid valve regurgitation 12/18/2020    Past Surgical History:  Procedure Laterality Date   APPENDECTOMY      Family History  Problem Relation Age of Onset   Heart attack Father 68   Stroke Father    Severe combined immunodeficiency Sister    Breast cancer Paternal Aunt    Ulcerative colitis Neg Hx    Esophageal cancer Neg Hx     Social History   Socioeconomic History   Marital status: Single    Spouse name: Not on file   Number of children: Not on file   Years of  education: Not on file   Highest education level: Not on file  Occupational History   Not on file  Tobacco Use   Smoking status: Some Days    Packs/day: 0.50    Types: Cigarettes   Smokeless tobacco: Never  Vaping Use   Vaping Use: Never used  Substance and Sexual Activity   Alcohol use: Yes    Comment: occas   Drug use: Not Currently    Comment: not currently-was +cocaine and amphetamines, clean x3 years   Sexual activity: Not Currently    Birth control/protection: Pill  Other Topics Concern   Not on file  Social History Narrative   ** Merged History Encounter **       Social Determinants of Health   Financial Resource Strain: Not on file  Food Insecurity: Not on file  Transportation Needs: Not on file  Physical Activity: Not on file  Stress: Not on file  Social Connections: Not on file  Intimate Partner Violence: Not on file    Outpatient Medications Prior to Visit  Medication Sig Dispense Refill   desvenlafaxine (PRISTIQ) 25 MG 24 hr tablet Take 3 tablets (75 mg total) by mouth daily. 90 tablet 3   gabapentin (NEURONTIN) 600 MG tablet Take 2 tablets (1,200 mg total) by mouth 2 (two) times daily. 120 tablet 3   hydrOXYzine (ATARAX) 25 MG tablet Take 1 tablet (25 mg total) by mouth 3 (three) times daily as needed. 90 tablet 3    mirtazapine (REMERON) 30 MG tablet Take 1 tablet (30 mg total) by mouth at bedtime. 30 tablet 3   OLANZapine (ZYPREXA) 2.5 MG tablet Take 1 tablet (2.5 mg total) by mouth at bedtime as needed (insomnia/sleep). 30 tablet 3   atorvastatin (LIPITOR) 20 MG tablet Take 1 tablet (20 mg total) by mouth at bedtime. 30 tablet 1   cloNIDine (CATAPRES) 0.2 MG tablet Take 1 tablet (0.2 mg total) by mouth at bedtime. 60 tablet 4   norethindrone (MICRONOR) 0.35 MG tablet Take 1 tablet (0.35 mg total) by mouth daily. 28 tablet 11   pantoprazole (PROTONIX) 20 MG tablet Take 1 tablet (20 mg total) by mouth daily. 60 tablet 4   propranolol (INDERAL) 10 MG tablet Take 1 tablet (10 mg total) by mouth once every morning. 30 tablet 1   amoxicillin (AMOXIL) 500 MG capsule Take 1 capsule by mouth 3 times daily until gone. (Patient not taking: Reported on 04/24/2022) 30 capsule 0   benzonatate (TESSALON PERLES) 100 MG capsule Take 1 capsule (100 mg total) by mouth every 6 (six) hours as needed for cough. (Patient not taking: Reported on 04/24/2022) 30 capsule 0   doxycycline (VIBRAMYCIN) 100 MG capsule Take 1 capsule (100 mg total) by mouth 2 (two) times daily. (Patient not taking: Reported on 04/24/2022) 20 capsule 0   guaiFENesin-codeine 100-10 MG/5ML syrup Take 5 mLs by mouth 3 (three) times daily as needed for cough. (Patient not taking: Reported on 04/24/2022) 100 mL 0   ibuprofen (ADVIL) 600 MG tablet Take 1 tablet (600 mg total) by mouth every 8 (eight) hours as needed. (Patient not taking: Reported on 04/24/2022) 30 tablet 0   predniSONE (DELTASONE) 10 MG tablet Take 1 tablet (10 mg total) by mouth daily with breakfast. (Patient not taking: Reported on 04/24/2022) 3 tablet 0   No facility-administered medications prior to visit.    Allergies  Allergen Reactions   Lamictal [Lamotrigine] Anaphylaxis, Rash and Other (See Comments)    Stevens-Johnson syndrome and fevers, also  Lamictal [Lamotrigine] Anaphylaxis, Rash and  Other (See Comments)    Fevers and Stevens-Johnson syndrome, also   Tramadol Other (See Comments)    Pt had a seizure after taking Tramadol!!   Geodon [Ziprasidone Hcl] Rash   Levetiracetam Anxiety    ROS Review of Systems  Constitutional:  Negative for chills, diaphoresis and fever.  HENT:  Negative for congestion, hearing loss, nosebleeds, sore throat and tinnitus.   Eyes:  Negative for photophobia and redness.  Respiratory:  Negative for cough, shortness of breath, wheezing and stridor.   Cardiovascular:  Negative for chest pain, palpitations and leg swelling.  Gastrointestinal:  Negative for abdominal pain, blood in stool, constipation, diarrhea, nausea and vomiting.  Endocrine: Negative for polydipsia.  Genitourinary:  Negative for dysuria, flank pain, frequency, hematuria and urgency.  Musculoskeletal:  Negative for back pain, myalgias and neck pain.  Skin:  Negative for rash.  Allergic/Immunologic: Negative for environmental allergies.  Neurological:  Negative for dizziness, tremors, seizures, weakness and headaches.  Hematological:  Does not bruise/bleed easily.  Psychiatric/Behavioral:  Negative for self-injury, sleep disturbance and suicidal ideas. The patient is not nervous/anxious and is not hyperactive.       Objective:    Physical Exam Vitals reviewed.  Constitutional:      Appearance: Normal appearance. She is well-developed. She is obese. She is not diaphoretic.  HENT:     Head: Normocephalic and atraumatic.     Right Ear: Tympanic membrane normal.     Left Ear: Tympanic membrane normal.     Nose: Nose normal. No nasal deformity, septal deviation, mucosal edema, congestion or rhinorrhea.     Right Sinus: No maxillary sinus tenderness or frontal sinus tenderness.     Left Sinus: No maxillary sinus tenderness or frontal sinus tenderness.     Mouth/Throat:     Mouth: Mucous membranes are moist.     Pharynx: Oropharynx is clear. No oropharyngeal exudate.      Comments: Fractured left first molar left lower jaw Eyes:     General: No scleral icterus.    Conjunctiva/sclera: Conjunctivae normal.     Pupils: Pupils are equal, round, and reactive to light.  Neck:     Thyroid: No thyromegaly.     Vascular: No carotid bruit or JVD.     Trachea: Trachea normal. No tracheal tenderness or tracheal deviation.  Cardiovascular:     Rate and Rhythm: Normal rate and regular rhythm.     Chest Wall: PMI is not displaced.     Pulses: Normal pulses. No decreased pulses.     Heart sounds: Normal heart sounds, S1 normal and S2 normal. Heart sounds not distant. No murmur heard.    No systolic murmur is present.     No diastolic murmur is present.     No friction rub. No gallop. No S3 or S4 sounds.  Pulmonary:     Effort: No tachypnea, accessory muscle usage or respiratory distress.     Breath sounds: No stridor. No decreased breath sounds, wheezing, rhonchi or rales.  Chest:     Chest wall: No tenderness.  Abdominal:     General: Bowel sounds are normal. There is no distension.     Palpations: Abdomen is soft. Abdomen is not rigid.     Tenderness: There is no abdominal tenderness. There is no guarding or rebound.  Musculoskeletal:        General: No tenderness. Normal range of motion.     Cervical back: Normal range of motion and  neck supple. No edema, erythema or rigidity. No muscular tenderness. Normal range of motion.  Lymphadenopathy:     Head:     Right side of head: No submental or submandibular adenopathy.     Left side of head: No submental or submandibular adenopathy.     Cervical: No cervical adenopathy.  Skin:    General: Skin is warm and dry.     Coloration: Skin is not pale.     Findings: No rash.     Nails: There is no clubbing.  Neurological:     General: No focal deficit present.     Mental Status: She is alert and oriented to person, place, and time. Mental status is at baseline.     Sensory: No sensory deficit.  Psychiatric:         Mood and Affect: Mood normal.        Speech: Speech normal.        Behavior: Behavior normal.        Thought Content: Thought content normal.     BP (!) 137/95   Pulse 72   Ht 5' 3" (1.6 m)   Wt 183 lb 6.4 oz (83.2 kg)   LMP 10/27/2019   SpO2 98%   BMI 32.49 kg/m  Wt Readings from Last 3 Encounters:  04/24/22 183 lb 6.4 oz (83.2 kg)  01/23/22 182 lb (82.6 kg)  01/12/22 180 lb (81.6 kg)  11/2021 echo IMPRESSIONS Left ventricular ejection fraction, by estimation, is 60 to 65%. The left ventricle has normal function. The left ventricle has no regional wall motion abnormalities. Left ventricular diastolic parameters were normal. 1. Right ventricular systolic function is normal. The right ventricular size is normal. There is normal pulmonary artery systolic pressure. 2. 3. The mitral valve is normal in structure. Trivial mitral valve regurgitation. 4. The aortic valve is normal in structure. Aortic valve regurgitation is not visualized 11/24/21 Cor CT  IMPRESSION: 1. Coronary calcium score of 0. This was 0 percentile for age-, sex, and race-matched controls.   2.  Normal coronary origin with right dominance.   3.  Normal coronary arteries.  CAD RADS 0.   4.  Consider non atherosclerotic causes of chest pain.    There are no preventive care reminders to display for this patient.   There are no preventive care reminders to display for this patient.  Lab Results  Component Value Date   TSH 1.300 05/16/2021   Lab Results  Component Value Date   WBC 9.3 01/12/2022   HGB 14.0 01/12/2022   HCT 42.2 01/12/2022   MCV 90.4 01/12/2022   PLT 301 01/12/2022   Lab Results  Component Value Date   NA 136 01/12/2022   K 4.1 01/12/2022   CO2 22 01/12/2022   GLUCOSE 87 01/12/2022   BUN 8 01/12/2022   CREATININE 0.86 01/12/2022   BILITOT 0.4 01/12/2022   ALKPHOS 101 01/12/2022   AST 30 01/12/2022   ALT 54 (H) 01/12/2022   PROT 8.2 (H) 01/12/2022   ALBUMIN 4.9 01/12/2022    CALCIUM 10.2 01/12/2022   ANIONGAP 12 01/12/2022   EGFR 101 11/11/2021   GFR 75.12 05/25/2017   Lab Results  Component Value Date   CHOL 183 05/16/2021   Lab Results  Component Value Date   HDL 94 05/16/2021   Lab Results  Component Value Date   LDLCALC 69 05/16/2021   Lab Results  Component Value Date   TRIG 118 05/16/2021   Lab Results  Component Value Date   CHOLHDL 1.9 05/16/2021   Lab Results  Component Value Date   HGBA1C 6.1 (H) 12/19/2021      Assessment & Plan:   Problem List Items Addressed This Visit       Cardiovascular and Mediastinum   Essential hypertension    Blood pressure not well controlled plan to begin valsartan 80 mg daily and continue clonidine and Inderal  Patient return for short-term follow-up      Relevant Medications   valsartan (DIOVAN) 80 MG tablet   atorvastatin (LIPITOR) 20 MG tablet   cloNIDine (CATAPRES) 0.2 MG tablet   propranolol (INDERAL) 10 MG tablet     Other   Major depressive disorder, recurrent severe without psychotic features ([place])    Improved mental health on current program per behavioral [facility]      PTSD (post-traumatic stress disorder)   Relevant Medications   propranolol (INDERAL) 10 MG tablet   Mixed hyperlipidemia    Continue statins      Relevant Medications   valsartan (DIOVAN) 80 MG tablet   atorvastatin (LIPITOR) 20 MG tablet   cloNIDine (CATAPRES) 0.2 MG tablet   propranolol (INDERAL) 10 MG tablet   Class 1 obesity with body mass index (BMI) of 32.0 to 32.9 in adult    Patient with prediabetes and obesity will see if we can get this patient Ozempic with patient assistance      Symptoms, such as flushing, sleeplessness, headache, lack of concentration, associated with the menopause    Markedly improved symptoms on Micronor      Prediabetes - Primary    A1c 6.1 average blood sugar 168 we will see if we can get this patient Ozempic      Tobacco use       Current smoking  consumption amount: 3 cigarettes daily  Dicsussion on advise to quit smoking and smoking impacts: Cardiovascular impacts  Patient's willingness to quit: Willing to quit  Methods to quit smoking discussed: Behavioral modification  Medication management of smoking session drugs discussed: Nicotine replacement  Resources provided:  AVS   Setting quit date not established  Follow-up arranged 2 months   Time spent counseling the patient: 5 minutes        Meds ordered this encounter  Medications   valsartan (DIOVAN) 80 MG tablet    Sig: Take 1 tablet (80 mg total) by mouth daily.    Dispense:  60 tablet    Refill:  3   atorvastatin (LIPITOR) 20 MG tablet    Sig: Take 1 tablet (20 mg total) by mouth at bedtime.    Dispense:  60 tablet    Refill:  4   cloNIDine (CATAPRES) 0.2 MG tablet    Sig: Take 1 tablet (0.2 mg total) by mouth at bedtime.    Dispense:  60 tablet    Refill:  4   norethindrone (MICRONOR) 0.35 MG tablet    Sig: Take 1 tablet (0.35 mg total) by mouth daily.    Dispense:  28 tablet    Refill:  11   pantoprazole (PROTONIX) 20 MG tablet    Sig: Take 1 tablet (20 mg total) by mouth daily.    Dispense:  60 tablet    Refill:  4   propranolol (INDERAL) 10 MG tablet    Sig: Take 1 tablet (10 mg total) by mouth once every morning.    Dispense:  30 tablet    Refill:  1   38 minutes spent history  and physical complex decision making high multiple systems assessed Follow-up: Return in about 6 weeks (around 06/05/2022) for htn.    Asencion Noble, MD

## 2022-04-24 ENCOUNTER — Ambulatory Visit: Payer: Self-pay | Attending: Critical Care Medicine | Admitting: Critical Care Medicine

## 2022-04-24 ENCOUNTER — Encounter: Payer: Self-pay | Admitting: Critical Care Medicine

## 2022-04-24 ENCOUNTER — Other Ambulatory Visit: Payer: Self-pay

## 2022-04-24 VITALS — BP 137/95 | HR 72 | Ht 63.0 in | Wt 183.4 lb

## 2022-04-24 DIAGNOSIS — I10 Essential (primary) hypertension: Secondary | ICD-10-CM

## 2022-04-24 DIAGNOSIS — Z720 Tobacco use: Secondary | ICD-10-CM | POA: Insufficient documentation

## 2022-04-24 DIAGNOSIS — E6609 Other obesity due to excess calories: Secondary | ICD-10-CM

## 2022-04-24 DIAGNOSIS — F332 Major depressive disorder, recurrent severe without psychotic features: Secondary | ICD-10-CM

## 2022-04-24 DIAGNOSIS — F17210 Nicotine dependence, cigarettes, uncomplicated: Secondary | ICD-10-CM

## 2022-04-24 DIAGNOSIS — E782 Mixed hyperlipidemia: Secondary | ICD-10-CM

## 2022-04-24 DIAGNOSIS — N951 Menopausal and female climacteric states: Secondary | ICD-10-CM

## 2022-04-24 DIAGNOSIS — F4310 Post-traumatic stress disorder, unspecified: Secondary | ICD-10-CM

## 2022-04-24 DIAGNOSIS — Z6832 Body mass index (BMI) 32.0-32.9, adult: Secondary | ICD-10-CM

## 2022-04-24 DIAGNOSIS — R7303 Prediabetes: Secondary | ICD-10-CM | POA: Insufficient documentation

## 2022-04-24 MED ORDER — VALSARTAN 80 MG PO TABS
80.0000 mg | ORAL_TABLET | Freq: Every day | ORAL | 3 refills | Status: DC
  Filled 2022-04-24: qty 60, 60d supply, fill #0
  Filled 2022-06-09: qty 30, 30d supply, fill #1
  Filled 2022-07-21: qty 30, 30d supply, fill #2
  Filled 2022-08-21: qty 30, 30d supply, fill #3

## 2022-04-24 MED ORDER — CLONIDINE HCL 0.2 MG PO TABS
0.2000 mg | ORAL_TABLET | Freq: Every day | ORAL | 4 refills | Status: DC
  Filled 2022-04-24 – 2022-05-04 (×4): qty 60, 60d supply, fill #0
  Filled 2022-05-12: qty 30, 30d supply, fill #0
  Filled 2022-06-09: qty 30, 30d supply, fill #1
  Filled 2022-06-30: qty 30, 30d supply, fill #2

## 2022-04-24 MED ORDER — ATORVASTATIN CALCIUM 20 MG PO TABS
20.0000 mg | ORAL_TABLET | Freq: Every day | ORAL | 4 refills | Status: DC
  Filled 2022-04-24: qty 60, 60d supply, fill #0
  Filled 2022-05-10: qty 30, 30d supply, fill #0
  Filled 2022-06-09: qty 30, 30d supply, fill #1
  Filled 2022-07-21: qty 30, 30d supply, fill #2
  Filled 2022-08-21: qty 30, 30d supply, fill #3

## 2022-04-24 MED ORDER — PANTOPRAZOLE SODIUM 20 MG PO TBEC
20.0000 mg | DELAYED_RELEASE_TABLET | Freq: Every day | ORAL | 4 refills | Status: DC
Start: 2022-04-24 — End: 2022-08-24
  Filled 2022-04-24: qty 60, 60d supply, fill #0
  Filled 2022-04-28: qty 30, 30d supply, fill #0
  Filled 2022-06-09: qty 30, 30d supply, fill #1
  Filled 2022-07-21: qty 30, 30d supply, fill #2
  Filled 2022-08-21: qty 30, 30d supply, fill #3

## 2022-04-24 MED ORDER — NORETHINDRONE 0.35 MG PO TABS
1.0000 | ORAL_TABLET | Freq: Every day | ORAL | 11 refills | Status: DC
Start: 2022-04-24 — End: 2023-01-09
  Filled 2022-04-24 – 2022-05-29 (×2): qty 28, 28d supply, fill #0
  Filled 2022-06-30: qty 28, 28d supply, fill #1
  Filled 2022-08-14: qty 28, 28d supply, fill #2
  Filled 2022-09-06: qty 28, 28d supply, fill #3
  Filled 2022-10-04: qty 28, 28d supply, fill #4
  Filled 2022-11-21: qty 28, 28d supply, fill #5
  Filled 2022-12-14: qty 28, 28d supply, fill #6

## 2022-04-24 MED ORDER — PROPRANOLOL HCL 10 MG PO TABS
10.0000 mg | ORAL_TABLET | Freq: Every morning | ORAL | 1 refills | Status: DC
  Filled 2022-04-24 – 2022-04-28 (×2): qty 30, 30d supply, fill #0
  Filled 2022-05-30: qty 30, 30d supply, fill #1

## 2022-04-24 NOTE — Assessment & Plan Note (Signed)
Improved mental health on current program [facility]

## 2022-04-24 NOTE — Progress Notes (Signed)
Patient stated that she also wanted to see if blood sugar is high

## 2022-04-24 NOTE — Assessment & Plan Note (Signed)
Markedly improved symptoms on Micronor

## 2022-04-24 NOTE — Assessment & Plan Note (Signed)
Blood pressure not well controlled plan to begin valsartan 80 mg daily and continue clonidine and Inderal  Patient return for short-term follow-up

## 2022-04-24 NOTE — Assessment & Plan Note (Signed)
  .   Current smoking consumption amount: 3 cigarettes daily  . Dicsussion on advise to quit smoking and smoking impacts: Cardiovascular impacts  . Patient's willingness to quit: Willing to quit  . Methods to quit smoking discussed: Behavioral modification  . Medication management of smoking session drugs discussed: Nicotine replacement  . Resources provided:  AVS   . Setting quit date not established  . Follow-up arranged 2 months   Time spent counseling the patient: 5 minutes

## 2022-04-24 NOTE — Patient Instructions (Signed)
Refills on all medications sent to pharmacy downstairs that Dr. Delford Field prescribes  Begin valsartan 80 mg daily for blood pressure  We discussed increasing the amount of plant-based food in your diet and try to do 5 days of walking 30 minutes  I will inquire to see if we can get Ozempic for your prediabetes and obesity for the patient assistance program  Return to see Dr. Delford Field 6 weeks

## 2022-04-24 NOTE — Assessment & Plan Note (Signed)
-  Continue statins ?

## 2022-04-24 NOTE — Assessment & Plan Note (Signed)
Patient with prediabetes and obesity will see if we can get this patient Ozempic with patient assistance

## 2022-04-24 NOTE — Assessment & Plan Note (Signed)
A1c 6.1 average blood sugar 168 we will see if we can get this patient Ozempic

## 2022-04-25 ENCOUNTER — Other Ambulatory Visit: Payer: Self-pay

## 2022-04-25 ENCOUNTER — Telehealth: Payer: Self-pay | Admitting: Critical Care Medicine

## 2022-04-25 MED ORDER — INSULIN PEN NEEDLE 31G X 5 MM MISC
0 refills | Status: DC
Start: 2022-04-25 — End: 2022-07-18
  Filled 2022-04-25: qty 100, 30d supply, fill #0
  Filled 2022-04-28: qty 100, fill #0
  Filled 2022-04-28: qty 100, 30d supply, fill #0
  Filled 2022-05-03: qty 100, 25d supply, fill #0

## 2022-04-25 MED ORDER — OZEMPIC (0.25 OR 0.5 MG/DOSE) 2 MG/3ML ~~LOC~~ SOPN
PEN_INJECTOR | SUBCUTANEOUS | 4 refills | Status: DC
  Filled 2022-04-25 – 2022-05-03 (×5): qty 3, fill #0

## 2022-04-25 NOTE — Telephone Encounter (Signed)
Order for ozempic due to prediabetes and weight gain with severe obesity

## 2022-04-28 ENCOUNTER — Other Ambulatory Visit: Payer: Self-pay

## 2022-05-01 ENCOUNTER — Other Ambulatory Visit: Payer: Self-pay

## 2022-05-03 ENCOUNTER — Other Ambulatory Visit: Payer: Self-pay

## 2022-05-04 ENCOUNTER — Other Ambulatory Visit: Payer: Self-pay

## 2022-05-05 ENCOUNTER — Other Ambulatory Visit: Payer: Self-pay

## 2022-05-08 ENCOUNTER — Other Ambulatory Visit: Payer: Self-pay

## 2022-05-10 ENCOUNTER — Other Ambulatory Visit: Payer: Self-pay

## 2022-05-11 ENCOUNTER — Other Ambulatory Visit: Payer: Self-pay

## 2022-05-12 ENCOUNTER — Other Ambulatory Visit: Payer: Self-pay

## 2022-05-15 ENCOUNTER — Other Ambulatory Visit: Payer: Self-pay

## 2022-05-30 ENCOUNTER — Other Ambulatory Visit: Payer: Self-pay

## 2022-05-31 ENCOUNTER — Other Ambulatory Visit: Payer: Self-pay

## 2022-06-05 ENCOUNTER — Other Ambulatory Visit: Payer: Self-pay

## 2022-06-06 ENCOUNTER — Other Ambulatory Visit: Payer: Self-pay

## 2022-06-07 ENCOUNTER — Other Ambulatory Visit: Payer: Self-pay

## 2022-06-08 ENCOUNTER — Ambulatory Visit: Payer: No Typology Code available for payment source | Admitting: Critical Care Medicine

## 2022-06-09 ENCOUNTER — Other Ambulatory Visit: Payer: Self-pay

## 2022-06-12 ENCOUNTER — Other Ambulatory Visit: Payer: Self-pay

## 2022-06-15 ENCOUNTER — Other Ambulatory Visit: Payer: Self-pay

## 2022-06-15 ENCOUNTER — Telehealth (INDEPENDENT_AMBULATORY_CARE_PROVIDER_SITE_OTHER): Payer: No Payment, Other | Admitting: Psychiatry

## 2022-06-15 ENCOUNTER — Encounter (HOSPITAL_COMMUNITY): Payer: Self-pay | Admitting: Psychiatry

## 2022-06-15 DIAGNOSIS — F4310 Post-traumatic stress disorder, unspecified: Secondary | ICD-10-CM | POA: Diagnosis not present

## 2022-06-15 DIAGNOSIS — F3341 Major depressive disorder, recurrent, in partial remission: Secondary | ICD-10-CM

## 2022-06-15 MED ORDER — GABAPENTIN 600 MG PO TABS
1200.0000 mg | ORAL_TABLET | Freq: Two times a day (BID) | ORAL | 3 refills | Status: DC
  Filled 2022-06-15 – 2022-06-22 (×5): qty 120, 30d supply, fill #0

## 2022-06-15 MED ORDER — MIRTAZAPINE 30 MG PO TABS
30.0000 mg | ORAL_TABLET | Freq: Every day | ORAL | 3 refills | Status: DC
  Filled 2022-06-15 – 2022-07-06 (×4): qty 30, 30d supply, fill #0

## 2022-06-15 MED ORDER — OLANZAPINE 2.5 MG PO TABS
2.5000 mg | ORAL_TABLET | Freq: Every evening | ORAL | 3 refills | Status: DC | PRN
  Filled 2022-06-15 – 2022-07-04 (×2): qty 30, 30d supply, fill #0

## 2022-06-15 MED ORDER — HYDROXYZINE HCL 25 MG PO TABS
25.0000 mg | ORAL_TABLET | Freq: Three times a day (TID) | ORAL | 3 refills | Status: DC | PRN
Start: 2022-06-15 — End: 2022-07-18
  Filled 2022-06-15 – 2022-07-04 (×2): qty 90, 30d supply, fill #0

## 2022-06-15 MED ORDER — DESVENLAFAXINE SUCCINATE ER 25 MG PO TB24
75.0000 mg | ORAL_TABLET | Freq: Every day | ORAL | 3 refills | Status: DC
  Filled 2022-06-15 – 2022-06-21 (×2): qty 90, 30d supply, fill #0

## 2022-06-15 NOTE — Progress Notes (Signed)
BH MD/PA/NP OP Progress Note Virtual Visit via Video Note  I connected with Margaret Mccann on 06/15/22 at 11:30 AM EDT by a video enabled telemedicine application and verified that I am speaking with the correct person using two identifiers.  Location: Patient: Work Provider: Clinic   I discussed the limitations of evaluation and management by telemedicine and the availability of in person appointments. The patient expressed understanding and agreed to proceed.  I provided 30 minutes of non-face-to-face time during this encounter.   06/15/2022 11:28 AM Margaret Mccann  MRN:  [id number]  Chief Complaint: "Everything is good"   HPI: 51 year old female seen today for follow-up psychiatric evaluation.   She has a psychiatric history of depression, anxiety, PTSD, and substance use (cocaine, amphetamine use, stimulants, and alcohol).  She is managed on gabapentin 600 mg twice daily, Remeron 30 mg nightly, Pristiq 75 mg daily, Zyprexa 2.5 mg nightly (takes as needed), and hydroxyzine 25 mg 3 times daily. She is also prescribed clonidine and propanolol by her PCP. She notes her medications are effective in managing her psychiatric conditions.  Today she is well-groomed, pleasant, cooperative, engaged in conversation, maintained eye contact.  She informed provider that everything is good.  She notes that recently she lost her job but notes that she is able to cope with this.  She does worry about finding a new position and financial stressors but is hopeful for the future.  Provider conducted a GAD-7 and patient scored a 4, at her last visit she scored a 2.  Provider also conducted PHQ-9 and patient scored a 0, at her last visit she scored a 1.  She endorses adequate sleep and appetite. Today she denies SI/HI/VAH, mania, paranoia.      No medication changes made today.  Patient agreeable to continue medication as prescribed.  No other concerns noted at this time.     Visit  Diagnosis:    ICD-10-CM   1. Major depressive disorder, recurrent episode, in partial remission with anxious distress (HCC)  F33.41 gabapentin (NEURONTIN) 600 MG tablet    mirtazapine (REMERON) 30 MG tablet    OLANZapine (ZYPREXA) 2.5 MG tablet    2. PTSD (post-traumatic stress disorder)  F43.10 mirtazapine (REMERON) 30 MG tablet      Past Psychiatric History:  MDD , anxiety, PTSD, Substance abuse (cocaine, alcohol)  Past Medical History:  Past Medical History:  Diagnosis Date   Alcohol withdrawal seizure with complication, with unspecified complication (HCC) 12/23/2020   Anxiety    Bipolar 1 disorder (HCC)    Depression    Intentional drug overdose (HCC) 08/04/2020   Major depressive disorder, recurrent episode with anxious distress (HCC) 08/11/2020   Morgellons syndrome    MRSA (methicillin resistant Staphylococcus aureus)    Suicide attempt (HCC)    Suicide by drug overdose (HCC) 08/11/2020   Tricuspid valve regurgitation 12/18/2020    Past Surgical History:  Procedure Laterality Date   APPENDECTOMY      Family Psychiatric History: Sister - committed suicide in 2005 by overdosing.  Family History:  Family History  Problem Relation Age of Onset   Heart attack Father 6450   Stroke Father    Severe combined immunodeficiency Sister    Breast cancer Paternal Aunt    Ulcerative colitis Neg Hx    Esophageal cancer Neg Hx     Social History:  Social History   Socioeconomic History   Marital status: Single    Spouse name: Not on file  Number of children: Not on file   Years of education: Not on file   Highest education level: Not on file  Occupational History   Not on file  Tobacco Use   Smoking status: Some Days    Packs/day: 0.50    Types: Cigarettes   Smokeless tobacco: Never  Vaping Use   Vaping Use: Never used  Substance and Sexual Activity   Alcohol use: Yes    Comment: occas   Drug use: Not Currently    Comment: not currently-was +cocaine and  amphetamines, clean x3 years   Sexual activity: Not Currently    Birth control/protection: Pill  Other Topics Concern   Not on file  Social History Narrative   ** Merged History Encounter **       Social Determinants of Health   Financial Resource Strain: Not on file  Food Insecurity: Not on file  Transportation Needs: Not on file  Physical Activity: Not on file  Stress: Not on file  Social Connections: Not on file    Allergies:  Allergies  Allergen Reactions   Lamictal [Lamotrigine] Anaphylaxis, Rash and Other (See Comments)    Stevens-Johnson syndrome and fevers, also   Lamictal [Lamotrigine] Anaphylaxis, Rash and Other (See Comments)    Fevers and Stevens-Johnson syndrome, also   Tramadol Other (See Comments)    Pt had a seizure after taking Tramadol!!   Geodon [Ziprasidone Hcl] Rash   Levetiracetam Anxiety    Metabolic Disorder Labs: Lab Results  Component Value Date   HGBA1C 6.1 (H) 12/19/2021   MPG 114.02 12/19/2020   No results found for: "PROLACTIN" Lab Results  Component Value Date   CHOL 183 05/16/2021   TRIG 118 05/16/2021   HDL 94 05/16/2021   CHOLHDL 1.9 05/16/2021   LDLCALC 69 05/16/2021   Lab Results  Component Value Date   TSH 1.300 05/16/2021    Therapeutic Level Labs: No results found for: "LITHIUM" No results found for: "VALPROATE" No results found for: "CBMZ"  Current Medications: Current Outpatient Medications  Medication Sig Dispense Refill   atorvastatin (LIPITOR) 20 MG tablet Take 1 tablet (20 mg total) by mouth at bedtime. 60 tablet 4   cloNIDine (CATAPRES) 0.2 MG tablet Take 1 tablet (0.2 mg total) by mouth at bedtime. 60 tablet 4   desvenlafaxine (PRISTIQ) 25 MG 24 hr tablet Take 3 tablets (75 mg total) by mouth daily. 90 tablet 3   gabapentin (NEURONTIN) 600 MG tablet Take 2 tablets (1,200 mg total) by mouth 2 (two) times daily. 120 tablet 3   hydrOXYzine (ATARAX) 25 MG tablet Take 1 tablet (25 mg total) by mouth 3 (three)  times daily as needed. 90 tablet 3   Insulin Pen Needle 31G X 5 MM MISC Use with ozempic pen as directed 100 each 0   mirtazapine (REMERON) 30 MG tablet Take 1 tablet (30 mg total) by mouth at bedtime. 30 tablet 3   norethindrone (MICRONOR) 0.35 MG tablet Take 1 tablet (0.35 mg total) by mouth daily. 28 tablet 11   OLANZapine (ZYPREXA) 2.5 MG tablet Take 1 tablet (2.5 mg total) by mouth at bedtime as needed (insomnia/sleep). 30 tablet 3   pantoprazole (PROTONIX) 20 MG tablet Take 1 tablet (20 mg total) by mouth daily. 60 tablet 4   propranolol (INDERAL) 10 MG tablet Take 1 tablet (10 mg total) by mouth once every morning. 30 tablet 1   Semaglutide,0.25 or 0.5MG /DOS, (OZEMPIC, 0.25 OR 0.5 MG/DOSE,) 2 MG/3ML SOPN Take 0.25mg  weekly for one  week then increase to 0.5 mg weekly and stay at that dose 3 mL 4   valsartan (DIOVAN) 80 MG tablet Take 1 tablet (80 mg total) by mouth daily. 60 tablet 3   No current facility-administered medications for this visit.     Musculoskeletal: Strength & Muscle Tone: within normal limits, telehealth visit Gait & Station: normal, telehealth visit Patient leans: N/A  Psychiatric Specialty Exam: Review of Systems  Last menstrual period 10/27/2019.There is no height or weight on file to calculate BMI.  General Appearance: Well Groomed  Eye Contact:  Good  Speech:  Clear and Coherent and Normal Rate  Volume:  Normal  Mood:  Euthymic  Affect:  Appropriate and Congruent  Thought Process:  Coherent, Goal Directed, and Linear  Orientation:  Full (Time, Place, and Person)  Thought Content: WDL and Logical   Suicidal Thoughts:  No  Homicidal Thoughts:  No  Memory:  Immediate;   Good Recent;   Good Remote;   Good  Judgement:  Good  Insight:  Good  Psychomotor Activity:  Normal  Concentration:  Concentration: Good and Attention Span: Good  Recall:  Good  Fund of Knowledge: Good  Language: Good  Akathisia:  No  Handed:  Right  AIMS (if indicated): not done   Assets:  Communication Skills Desire for Improvement Financial Resources/Insurance Housing  ADL's:  Intact  Cognition: WNL  Sleep:  Good   Screenings: AIMS    Flowsheet Row [facility]  AIMS Total Score 0      AUDIT    Flowsheet Row [facility] Ad[facility] ([facility]  Alcohol Use Disorder Identification Test Final Score (AUDIT) 11 0      GAD-7    Flowsheet Row Video Visit from 06/15/2022 in [facility] Office Visit from 04/24/2022 in [facility] Video Visit from 03/23/2022 in [facility] Office Visit from 01/23/2022 in [facility] Office Visit from 12/19/2021 in [facility]  Total GAD-7 Score 4 3 2 3  0      PHQ2-9    Flowsheet Row Video Visit from 06/15/2022 in [facility] Office Visit from 04/24/2022 in [facility] Video Visit from 03/23/2022 in [facility] Office Visit from 01/23/2022 in [facility] Office Visit from 12/19/2021 in [facility]  PHQ-2 Total Score 0 0 0 0 0  PHQ-9 Total Score 0 -- 1 6 --      Flowsheet Row ED from 01/12/2022 in [facility] Video Visit from 08/09/2021 in [facility] ED from 08/05/2021 in [facility]  C-SSRS RISK CATEGORY No Risk No Risk No Risk        Assessment and Plan: Patient notes that she is doing well on her current medication regimen. No medication changes made today.  Patient agreeable to continue medication as prescribed.   1. Major depressive disorder,  recurrent episode, in partial remission with anxious distress (HCC)  Continue- gabapentin (NEURONTIN) 600 MG tablet; Take 2 tablets (1,200 mg total) by mouth 2 (two) times daily.  Dispense: 120 tablet; Refill: 3 Continue- mirtazapine (REMERON) 30 MG tablet; Take 1 tablet (30 mg total) by mouth at bedtime.  Dispense: 30 tablet; Refill: 3 Continue- desvenlafaxine (PRISTIQ) 50 MG 24 hr tablet; Take one and a half tablets daily (75 mg)  Dispense: 45 tablet; Refill: 3 Continue- OLANZapine (ZYPREXA) 2.5 MG tablet; Take 1 tablet (2.5 mg total) by mouth at bedtime as needed (insomnia/sleep).  Dispense: 15 tablet; Refill: 3  2. PTSD (post-traumatic stress disorder)  Continue- hydrOXYzine (ATARAX/VISTARIL) 25 MG tablet; Take 2 tablets twice daily as needed for anxiety and take 2 tablets at bedtime as needed for sleep  Dispense: 120 tablet; Refill: 3 Continue- mirtazapine (REMERON) 30 MG tablet; Take 1 tablet (30 mg total) by mouth at bedtime.  Dispense: 30 tablet; Refill: 3 Continue- desvenlafaxine (PRISTIQ) 50 MG 24 hr tablet; Take one and a half tablets daily (75 mg)  Dispense: 45 tablet; Refill: 3    Follow up in 3 months  Salley Slaughter, NP 06/15/2022, 11:28 AM

## 2022-06-21 ENCOUNTER — Other Ambulatory Visit: Payer: Self-pay

## 2022-06-22 ENCOUNTER — Telehealth: Payer: Self-pay | Admitting: Critical Care Medicine

## 2022-06-22 ENCOUNTER — Other Ambulatory Visit: Payer: Self-pay

## 2022-06-22 NOTE — Telephone Encounter (Signed)
Pt called in for assistance. Pt says that she is going out of town and is asking for early authorization for medication gabapentin (NEURONTIN) 600 MG tablet. Pt says that she was told by pharmacy to call PCP and have authorization to fill sooner   Please assist pt further.

## 2022-06-22 NOTE — Telephone Encounter (Signed)
Clarified with patient's PCP that he okay with early refill of gabapentin. Pharmacy has been made aware.

## 2022-06-26 ENCOUNTER — Other Ambulatory Visit: Payer: Self-pay | Admitting: Critical Care Medicine

## 2022-06-26 DIAGNOSIS — F4310 Post-traumatic stress disorder, unspecified: Secondary | ICD-10-CM

## 2022-06-27 ENCOUNTER — Other Ambulatory Visit: Payer: Self-pay

## 2022-06-27 MED ORDER — PROPRANOLOL HCL 10 MG PO TABS
10.0000 mg | ORAL_TABLET | Freq: Every morning | ORAL | 1 refills | Status: DC
  Filled 2022-06-27: qty 30, 30d supply, fill #0

## 2022-06-27 NOTE — Telephone Encounter (Signed)
Requested Prescriptions  Pending Prescriptions Disp Refills  . propranolol (INDERAL) 10 MG tablet 90 tablet 1    Sig: Take 1 tablet (10 mg total) by mouth once every morning.     Cardiovascular:  Beta Blockers Failed - 06/26/2022  3:57 PM      Failed - Last BP in normal range    BP Readings from Last 1 Encounters:  04/24/22 (!) 137/95         Passed - Last Heart Rate in normal range    Pulse Readings from Last 1 Encounters:  04/24/22 72         Passed - Valid encounter within last 6 months    Recent Outpatient Visits          2 months ago Prediabetes   [place] Elsie Stain, MD   5 months ago Pap smear for cervical cancer screening   [place] Ladell Pier, MD   6 months ago Class 1 obesity due to excess calories without serious comorbidity with body mass index (BMI) of 32.0 to 32.9 in adult   [place] Elsie Stain, MD   8 months ago Essential hypertension   [facility] Elsie Stain, MD   1 year ago Essential hypertension   [place] Elsie Stain, MD

## 2022-06-28 ENCOUNTER — Other Ambulatory Visit: Payer: Self-pay

## 2022-06-29 ENCOUNTER — Other Ambulatory Visit: Payer: Self-pay

## 2022-06-30 ENCOUNTER — Other Ambulatory Visit (HOSPITAL_COMMUNITY): Payer: Self-pay | Admitting: Psychiatry

## 2022-06-30 ENCOUNTER — Other Ambulatory Visit: Payer: Self-pay | Admitting: Critical Care Medicine

## 2022-06-30 ENCOUNTER — Other Ambulatory Visit: Payer: Self-pay

## 2022-06-30 DIAGNOSIS — F4310 Post-traumatic stress disorder, unspecified: Secondary | ICD-10-CM

## 2022-06-30 DIAGNOSIS — F3341 Major depressive disorder, recurrent, in partial remission: Secondary | ICD-10-CM

## 2022-06-30 NOTE — Telephone Encounter (Signed)
Requested Prescriptions  Pending Prescriptions Disp Refills  . cloNIDine (CATAPRES) 0.2 MG tablet [Pharmacy Med Name: cloNIDine HCl 0.2 MG Oral Tablet] 60 tablet 0    Sig: TAKE 1 TABLET BY MOUTH AT BEDTIME     Cardiovascular:  Alpha-2 Agonists Failed - 06/30/2022  3:52 PM      Failed - Last BP in normal range    BP Readings from Last 1 Encounters:  04/24/22 (!) 137/95         Passed - Last Heart Rate in normal range    Pulse Readings from Last 1 Encounters:  04/24/22 72         Passed - Valid encounter within last 6 months    Recent Outpatient Visits          2 months ago Prediabetes   [place] Elsie Stain, MD   5 months ago Pap smear for cervical cancer screening   [place] Karle Plumber B, MD   6 months ago Class 1 obesity due to excess calories without serious comorbidity with body mass index (BMI) of 32.0 to 32.9 in adult   [place] Elsie Stain, MD   8 months ago Essential hypertension   [facility] Elsie Stain, MD   1 year ago Essential hypertension   [place] Elsie Stain, MD

## 2022-07-03 ENCOUNTER — Other Ambulatory Visit (HOSPITAL_COMMUNITY): Payer: Self-pay | Admitting: Psychiatry

## 2022-07-03 ENCOUNTER — Telehealth (HOSPITAL_COMMUNITY): Payer: Self-pay | Admitting: *Deleted

## 2022-07-03 DIAGNOSIS — F3341 Major depressive disorder, recurrent, in partial remission: Secondary | ICD-10-CM

## 2022-07-03 DIAGNOSIS — F4310 Post-traumatic stress disorder, unspecified: Secondary | ICD-10-CM

## 2022-07-03 MED ORDER — MIRTAZAPINE 30 MG PO TABS: 30.0000 mg | ORAL_TABLET | Freq: Every day | ORAL | 3 refills | Status: DC

## 2022-07-03 NOTE — Telephone Encounter (Signed)
Medication refilled and sent to preferred pharmacy

## 2022-07-03 NOTE — Telephone Encounter (Signed)
Rx Refill Request:: mirtazapine (REMERON) 30 MG tablet TAKE 1 TABLET BY MOUTH AT BEDTIME

## 2022-07-05 ENCOUNTER — Other Ambulatory Visit: Payer: Self-pay

## 2022-07-06 ENCOUNTER — Other Ambulatory Visit: Payer: Self-pay

## 2022-07-15 ENCOUNTER — Inpatient Hospital Stay (HOSPITAL_COMMUNITY)
Admission: EM | Admit: 2022-07-15 | Discharge: 2022-07-18 | DRG: 918 | Disposition: A | Discharge status: Other Acute Inpatient Hospital | Payer: No Typology Code available for payment source | Attending: Internal Medicine | Admitting: Internal Medicine

## 2022-07-15 ENCOUNTER — Encounter (HOSPITAL_COMMUNITY): Payer: Self-pay

## 2022-07-15 ENCOUNTER — Other Ambulatory Visit: Payer: Self-pay

## 2022-07-15 DIAGNOSIS — D72829 Elevated white blood cell count, unspecified: Secondary | ICD-10-CM | POA: Diagnosis present

## 2022-07-15 DIAGNOSIS — I952 Hypotension due to drugs: Secondary | ICD-10-CM | POA: Diagnosis present

## 2022-07-15 DIAGNOSIS — D649 Anemia, unspecified: Secondary | ICD-10-CM | POA: Diagnosis present

## 2022-07-15 DIAGNOSIS — Z818 Family history of other mental and behavioral disorders: Secondary | ICD-10-CM

## 2022-07-15 DIAGNOSIS — F410 Panic disorder [episodic paroxysmal anxiety] without agoraphobia: Secondary | ICD-10-CM | POA: Diagnosis present

## 2022-07-15 DIAGNOSIS — Z888 Allergy status to other drugs, medicaments and biological substances status: Secondary | ICD-10-CM

## 2022-07-15 DIAGNOSIS — E876 Hypokalemia: Secondary | ICD-10-CM | POA: Diagnosis present

## 2022-07-15 DIAGNOSIS — Z8249 Family history of ischemic heart disease and other diseases of the circulatory system: Secondary | ICD-10-CM

## 2022-07-15 DIAGNOSIS — T465X2A Poisoning by other antihypertensive drugs, intentional self-harm, initial encounter: Principal | ICD-10-CM | POA: Diagnosis present

## 2022-07-15 DIAGNOSIS — F4310 Post-traumatic stress disorder, unspecified: Secondary | ICD-10-CM | POA: Diagnosis present

## 2022-07-15 DIAGNOSIS — Z9151 Personal history of suicidal behavior: Secondary | ICD-10-CM

## 2022-07-15 DIAGNOSIS — Z885 Allergy status to narcotic agent status: Secondary | ICD-10-CM

## 2022-07-15 DIAGNOSIS — I10 Essential (primary) hypertension: Secondary | ICD-10-CM | POA: Diagnosis present

## 2022-07-15 DIAGNOSIS — F1490 Cocaine use, unspecified, uncomplicated: Secondary | ICD-10-CM | POA: Diagnosis present

## 2022-07-15 DIAGNOSIS — Z823 Family history of stroke: Secondary | ICD-10-CM

## 2022-07-15 DIAGNOSIS — Z91410 Personal history of adult physical and sexual abuse: Secondary | ICD-10-CM

## 2022-07-15 DIAGNOSIS — Z79899 Other long term (current) drug therapy: Secondary | ICD-10-CM

## 2022-07-15 DIAGNOSIS — F17210 Nicotine dependence, cigarettes, uncomplicated: Secondary | ICD-10-CM | POA: Diagnosis present

## 2022-07-15 DIAGNOSIS — N179 Acute kidney failure, unspecified: Secondary | ICD-10-CM | POA: Diagnosis present

## 2022-07-15 DIAGNOSIS — R001 Bradycardia, unspecified: Secondary | ICD-10-CM | POA: Diagnosis present

## 2022-07-15 DIAGNOSIS — T465X4A Poisoning by other antihypertensive drugs, undetermined, initial encounter: Secondary | ICD-10-CM

## 2022-07-15 DIAGNOSIS — E870 Hyperosmolality and hypernatremia: Secondary | ICD-10-CM | POA: Diagnosis present

## 2022-07-15 DIAGNOSIS — Z1152 Encounter for screening for COVID-19: Secondary | ICD-10-CM

## 2022-07-15 DIAGNOSIS — E861 Hypovolemia: Secondary | ICD-10-CM | POA: Diagnosis present

## 2022-07-15 DIAGNOSIS — Z8614 Personal history of Methicillin resistant Staphylococcus aureus infection: Secondary | ICD-10-CM

## 2022-07-15 DIAGNOSIS — E785 Hyperlipidemia, unspecified: Secondary | ICD-10-CM | POA: Diagnosis present

## 2022-07-15 DIAGNOSIS — T50902A Poisoning by unspecified drugs, medicaments and biological substances, intentional self-harm, initial encounter: Secondary | ICD-10-CM

## 2022-07-15 DIAGNOSIS — T465X1A Poisoning by other antihypertensive drugs, accidental (unintentional), initial encounter: Secondary | ICD-10-CM | POA: Diagnosis present

## 2022-07-15 DIAGNOSIS — G4700 Insomnia, unspecified: Secondary | ICD-10-CM | POA: Diagnosis present

## 2022-07-15 DIAGNOSIS — Y92009 Unspecified place in unspecified non-institutional (private) residence as the place of occurrence of the external cause: Secondary | ICD-10-CM

## 2022-07-15 DIAGNOSIS — Z7985 Long-term (current) use of injectable non-insulin antidiabetic drugs: Secondary | ICD-10-CM

## 2022-07-15 DIAGNOSIS — F319 Bipolar disorder, unspecified: Secondary | ICD-10-CM | POA: Diagnosis present

## 2022-07-15 DIAGNOSIS — Z803 Family history of malignant neoplasm of breast: Secondary | ICD-10-CM

## 2022-07-15 LAB — I-STAT CHEM 8, ED
BUN: 6 mg/dL (ref 6–20)
BUN: 19 mg/dL (ref 6–20)
Calcium, Ion: 0.72 mmol/L — CL (ref 1.15–1.40)
Calcium, Ion: 1.07 mmol/L — ABNORMAL LOW (ref 1.15–1.40)
Chloride: 122 mmol/L — ABNORMAL HIGH (ref 98–111)
Chloride: 108 mmol/L (ref 98–111)
Creatinine, Ser: 0.5 mg/dL (ref 0.44–1.00)
Creatinine, Ser: 1.4 mg/dL — ABNORMAL HIGH (ref 0.44–1.00)
Glucose, Bld: 61 mg/dL — ABNORMAL LOW (ref 70–99)
Glucose, Bld: 105 mg/dL — ABNORMAL HIGH (ref 70–99)
HCT: 17 % — ABNORMAL LOW (ref 36.0–46.0)
HCT: 31 % — ABNORMAL LOW (ref 36.0–46.0)
Hemoglobin: 5.8 g/dL — CL (ref 12.0–15.0)
Hemoglobin: 10.5 g/dL — ABNORMAL LOW (ref 12.0–15.0)
Potassium: 2.2 mmol/L — CL (ref 3.5–5.1)
Potassium: 5.3 mmol/L — ABNORMAL HIGH (ref 3.5–5.1)
Sodium: 150 mmol/L — ABNORMAL HIGH (ref 135–145)
Sodium: 141 mmol/L (ref 135–145)
TCO2: 12 mmol/L — ABNORMAL LOW (ref 22–32)
TCO2: 24 mmol/L (ref 22–32)

## 2022-07-15 LAB — CBC WITH DIFFERENTIAL/PLATELET
Abs Immature Granulocytes: 0.03 10*3/uL (ref 0.00–0.07)
Basophils Absolute: 0 10*3/uL (ref 0.0–0.1)
Basophils Relative: 0 %
Eosinophils Absolute: 0.1 10*3/uL (ref 0.0–0.5)
Eosinophils Relative: 1 %
HCT: 34.4 % — ABNORMAL LOW (ref 36.0–46.0)
Hemoglobin: 11.3 g/dL — ABNORMAL LOW (ref 12.0–15.0)
Immature Granulocytes: 0 %
Lymphocytes Relative: 31 %
Lymphs Abs: 4.1 10*3/uL — ABNORMAL HIGH (ref 0.7–4.0)
MCH: 31.1 pg (ref 26.0–34.0)
MCHC: 32.8 g/dL (ref 30.0–36.0)
MCV: 94.8 fL (ref 80.0–100.0)
Monocytes Absolute: 1 10*3/uL (ref 0.1–1.0)
Monocytes Relative: 8 %
Neutro Abs: 8 10*3/uL — ABNORMAL HIGH (ref 1.7–7.7)
Neutrophils Relative %: 60 %
Platelets: 306 10*3/uL (ref 150–400)
RBC: 3.63 MIL/uL — ABNORMAL LOW (ref 3.87–5.11)
RDW: 13.1 % (ref 11.5–15.5)
WBC: 13.4 10*3/uL — ABNORMAL HIGH (ref 4.0–10.5)
nRBC: 0 % (ref 0.0–0.2)

## 2022-07-15 LAB — COMPREHENSIVE METABOLIC PANEL
ALT: 38 U/L (ref 0–44)
AST: 32 U/L (ref 15–41)
Albumin: 3.7 g/dL (ref 3.5–5.0)
Alkaline Phosphatase: 89 U/L (ref 38–126)
Anion gap: 9 (ref 5–15)
BUN: 15 mg/dL (ref 6–20)
CO2: 21 mmol/L — ABNORMAL LOW (ref 22–32)
Calcium: 8.4 mg/dL — ABNORMAL LOW (ref 8.9–10.3)
Chloride: 111 mmol/L (ref 98–111)
Creatinine, Ser: 1.13 mg/dL — ABNORMAL HIGH (ref 0.44–1.00)
GFR, Estimated: 60 mL/min — ABNORMAL LOW (ref >60)
Glucose, Bld: 111 mg/dL — ABNORMAL HIGH (ref 70–99)
Potassium: 4.1 mmol/L (ref 3.5–5.1)
Sodium: 141 mmol/L (ref 135–145)
Total Bilirubin: 0.4 mg/dL (ref 0.3–1.2)
Total Protein: 6.5 g/dL (ref 6.5–8.1)

## 2022-07-15 LAB — RESP PANEL BY RT-PCR (FLU A&B, COVID) ARPGX2
Influenza A by PCR: NEGATIVE
Influenza B by PCR: NEGATIVE
SARS Coronavirus 2 by RT PCR: NEGATIVE

## 2022-07-15 LAB — ACETAMINOPHEN LEVEL: Acetaminophen (Tylenol), Serum: <10 ug/mL — ABNORMAL LOW (ref 10–30)

## 2022-07-15 LAB — I-STAT BETA HCG BLOOD, ED (MC, WL, AP ONLY): I-stat hCG, quantitative: <5 m[IU]/mL (ref <5)

## 2022-07-15 LAB — SAMPLE TO BLOOD BANK

## 2022-07-15 LAB — PROTIME-INR
INR: 1.1 (ref 0.8–1.2)
Prothrombin Time: 14.2 seconds (ref 11.4–15.2)

## 2022-07-15 LAB — SALICYLATE LEVEL: Salicylate Lvl: <7 mg/dL — ABNORMAL LOW (ref 7.0–30.0)

## 2022-07-15 LAB — BLOOD GAS, VENOUS
Acid-base deficit: 2 mmol/L (ref 0.0–2.0)
Bicarbonate: 23.1 mmol/L (ref 20.0–28.0)
O2 Saturation: 87.3 %
Patient temperature: 37
pCO2, Ven: 40 mmHg — ABNORMAL LOW (ref 44–60)
pH, Ven: 7.37 (ref 7.25–7.43)
pO2, Ven: 58 mmHg — ABNORMAL HIGH (ref 32–45)

## 2022-07-15 LAB — ETHANOL: Alcohol, Ethyl (B): 116 mg/dL — ABNORMAL HIGH (ref <10)

## 2022-07-15 MED ORDER — LACTATED RINGERS IV BOLUS
1000.0000 mL | Freq: Once | INTRAVENOUS | Status: AC
  Administered 2022-07-15: 1000 mL via INTRAVENOUS

## 2022-07-15 MED ORDER — NALOXONE HCL 2 MG/2ML IJ SOSY
PREFILLED_SYRINGE | INTRAMUSCULAR | Status: AC
  Filled 2022-07-15: qty 2

## 2022-07-15 MED ORDER — NALOXONE HCL 2 MG/2ML IJ SOSY: 2.0000 mg | PREFILLED_SYRINGE | Freq: Once | INTRAMUSCULAR | Status: AC

## 2022-07-15 MED ORDER — NALOXONE HCL 2 MG/2ML IJ SOSY: 2.0000 mg | PREFILLED_SYRINGE | INTRAMUSCULAR | Status: DC | PRN

## 2022-07-15 MED ORDER — NOREPINEPHRINE 4 MG/250ML-% IV SOLN
INTRAVENOUS | Status: AC
  Administered 2022-07-15: 4 mg
  Filled 2022-07-15: qty 250

## 2022-07-15 NOTE — ED Triage Notes (Signed)
Pt. BIB [facility] for overdose on clonidine. EMS reports pt. taking a hand full of clonidine. 0.2mg   each about an hr and 45 mins ago. Pt. Also reports drinking 2 glasses of wine.

## 2022-07-15 NOTE — ED Notes (Signed)
Notified Docotr Schlossman of chem8 results - los potassium, hemoglobin and calcium.

## 2022-07-15 NOTE — ED Provider Notes (Signed)
[facility]ENC[facility]PT Provider Note   CSN: [id number] Arrival date & time: 07/15/22  2023     History  Chief Complaint  Patient presents with   Drug Overdose    Margaret Mccann Margaret Mccann is a 51 y.o. female.  HPI      51yo female with history of alcohol withdrawal, bipolar, overdose, depression, Morgellon's syndrome, prior admission for overdose 07/2020 who presents with concern for clonidine overdose.  She told EMS she took 12-15 .2mg  clonidine 1 hour and 45 minutes PTA and drank wine.  She tells me she took 4 in the morning, 3 in the afternoon. She tells me she is not suicidal at this time and she regrets taking these medications. She reports she does not want to go to [facility] and that she does not want to kill herself. She cannot saw why she took this amount of clonidine.  She does report multiple family members have died and that her fiance killed himself.  Denies overdosing on othe medications.        Past Medical History:  Diagnosis Date   Alcohol withdrawal seizure with complication, with unspecified complication (HCC) 12/23/2020   Anxiety    Bipolar 1 disorder (HCC)    Depression    Intentional drug overdose (HCC) 08/04/2020   Major depressive disorder, recurrent episode with anxious distress (HCC) 08/11/2020   Morgellons syndrome    MRSA (methicillin resistant Staphylococcus aureus)    Suicide attempt [facility])    Suicide by drug overdose (HCC) 08/11/2020   Tricuspid valve regurgitation 12/18/2020     Home Medications Prior to Admission medications   Medication Sig Start Date End Date Taking? Authorizing Provider  atorvastatin (LIPITOR) 20 MG tablet Take 1 tablet (20 mg total) by mouth at bedtime. 04/24/22   04/26/22, MD  cloNIDine (CATAPRES) 0.2 MG tablet TAKE 1 TABLET BY MOUTH AT BEDTIME 06/30/22   08/30/22, MD  desvenlafaxine (PRISTIQ) 25 MG 24 hr tablet Take 3 tablets (75 mg total) by mouth daily. 06/15/22   06/17/22, NP  gabapentin (NEURONTIN) 600 MG tablet Take 2 tablets (1,200 mg total) by mouth 2 (two) times daily. 06/15/22   06/17/22, NP  hydrOXYzine (ATARAX) 25 MG tablet Take 1 tablet (25 mg total) by mouth 3 (three) times daily as needed. 06/15/22   06/17/22, NP  Insulin Pen Needle 31G X 5 MM MISC Use with ozempic pen as directed 04/25/22   06/25/22, MD  mirtazapine (REMERON) 30 MG tablet Take 1 tablet (30 mg total) by mouth at bedtime. 07/03/22   09/02/22, NP  norethindrone (MICRONOR) 0.35 MG tablet Take 1 tablet (0.35 mg total) by mouth daily. 04/24/22   04/26/22, MD  OLANZapine (ZYPREXA) 2.5 MG tablet Take 1 tablet (2.5 mg total) by mouth at bedtime as needed (insomnia/sleep). 06/15/22   06/17/22, NP  pantoprazole (PROTONIX) 20 MG tablet Take 1 tablet (20 mg total) by mouth daily. 04/24/22   04/26/22, MD  propranolol (INDERAL) 10 MG tablet Take 1 tablet (10 mg total) by mouth once every morning. 06/27/22   08/27/22, MD  Semaglutide,0.25 or 0.5MG /DOS, (OZEMPIC, 0.25 OR 0.5 MG/DOSE,) 2 MG/3ML SOPN Take 0.25mg  weekly for one week then increase to 0.5 mg weekly and stay at that dose 04/25/22   06/25/22, MD  valsartan (DIOVAN) 80 MG tablet Take 1 tablet (80 mg total) by mouth daily. 04/24/22  Storm Frisk, MD      Allergies    Lamictal [lamotrigine], Lamictal [lamotrigine], Tramadol, Geodon [ziprasidone hcl], and Levetiracetam    Review of Systems   Review of Systems  Physical Exam Updated Vital Signs BP 102/71   Pulse 63   Temp 97.8 F (36.6 C) (Oral)   Resp 19   LMP 10/27/2019   SpO2 91%  Physical Exam Vitals and nursing note reviewed.  Constitutional:      General: She is not in acute distress.    Appearance: She is well-developed. She is not diaphoretic.  HENT:     Head: Normocephalic and atraumatic.  Eyes:     Conjunctiva/sclera: Conjunctivae normal.  Cardiovascular:     Rate and Rhythm:  Normal rate and regular rhythm.     Heart sounds: Normal heart sounds. No murmur heard.    No friction rub. No gallop.  Pulmonary:     Effort: Pulmonary effort is normal. No respiratory distress.     Breath sounds: Normal breath sounds. No wheezing or rales.  Abdominal:     General: There is no distension.     Palpations: Abdomen is soft.     Tenderness: There is no abdominal tenderness. There is no guarding.  Musculoskeletal:        General: No tenderness.     Cervical back: Normal range of motion.  Skin:    General: Skin is warm and dry.     Findings: No erythema or rash.  Neurological:     Mental Status: She is alert and oriented to person, place, and time.     Comments: Sleepy but appropriately answering questions     ED Results / Procedures / Treatments   Labs (all labs ordered are listed, but only abnormal results are displayed) Labs Reviewed  BLOOD GAS, VENOUS - Abnormal; Notable for the following components:      Result Value   pCO2, Ven 40 (*)    pO2, Ven 58 (*)    All other components within normal limits  ACETAMINOPHEN LEVEL - Abnormal; Notable for the following components:   Acetaminophen (Tylenol), Serum <10 (*)    All other components within normal limits  ETHANOL - Abnormal; Notable for the following components:   Alcohol, Ethyl (B) 116 (*)    All other components within normal limits  CBC WITH DIFFERENTIAL/PLATELET - Abnormal; Notable for the following components:   WBC 13.4 (*)    RBC 3.63 (*)    Hemoglobin 11.3 (*)    HCT 34.4 (*)    Neutro Abs 8.0 (*)    Lymphs Abs 4.1 (*)    All other components within normal limits  COMPREHENSIVE METABOLIC PANEL - Abnormal; Notable for the following components:   CO2 21 (*)    Glucose, Bld 111 (*)    Creatinine, Ser 1.13 (*)    Calcium 8.4 (*)    GFR, Estimated 60 (*)    All other components within normal limits  SALICYLATE LEVEL - Abnormal; Notable for the following components:   Salicylate Lvl <7.0 (*)     All other components within normal limits  I-STAT CHEM 8, ED - Abnormal; Notable for the following components:   Sodium 150 (*)    Potassium 2.2 (*)    Chloride 122 (*)    Glucose, Bld 61 (*)    Calcium, Ion 0.72 (*)    TCO2 12 (*)    Hemoglobin 5.8 (*)    HCT 17.0 (*)    All  other components within normal limits  I-STAT CHEM 8, ED - Abnormal; Notable for the following components:   Potassium 5.3 (*)    Creatinine, Ser 1.40 (*)    Glucose, Bld 105 (*)    Calcium, Ion 1.07 (*)    Hemoglobin 10.5 (*)    HCT 31.0 (*)    All other components within normal limits  RESP PANEL BY RT-PCR (FLU A&B, COVID) [id number]  PROTIME-INR  RAPID URINE DRUG SCREEN, HOSP PERFORMED  CBC WITH DIFFERENTIAL/PLATELET  I-STAT BETA HCG BLOOD, ED ([facility], [facility], [facility] ONLY)  SAMPLE TO BLOOD BANK  TYPE AND SCREEN    EKG EKG Interpretation  Date/Time:  Saturday July 15 2022 20:29:06 EDT Ventricular Rate:  61 PR Interval:  138 QRS Duration: 87 QT Interval:  451 QTC Calculation: 455 R Axis:   12 Text Interpretation: Sinus rhythm Low voltage, precordial leads ST elev, probable normal early repol pattern No significant change since last tracing Confirmed by Gareth Morgan [phone]) on 07/15/2022 10:30:12 PM  Radiology No results found.  Procedures Procedures    Medications Ordered in ED Medications  naloxone [facility]) injection 2 mg (has no administration in time range)  lactated ringers bolus 1,000 mL (0 mLs Intravenous Stopped 07/15/22 2301)  naloxone (NARCAN) 2 MG/2ML injection (  Given 07/15/22 2045)  naloxone [facility]) 2 MG/2ML injection (  Given 07/15/22 2048)  norepinephrine (LEVOPHED) 4-5 MG/250ML-% infusion SOLN (  Rate/Dose Change 07/15/22 2209)  naloxone [facility]) 2 MG/2ML injection (  Given 07/15/22 2101)  naloxone [facility]) injection 2 mg ( Intravenous Given 07/15/22 2107)  lactated ringers bolus 1,000 mL (1,000 mLs Intravenous New Bag/Given 07/15/22 2316)    ED Course/ Medical Decision Making/  A&P                             51yo female with history of alcohol withdrawal, bipolar, overdose, depression, Morgellon's syndrome, prior admission for overdose 07/2020 who presents with concern for clonidine overdose.  Arrives to the emergency department sleepy but answering questions appropriately.  Initial heart rate in 60s with blood pressure 57/41.  Oxygenation on room air was waxing and waning with sats down to the mid 80s.  She was placed on end-tidal CO2 and nonrebreather initially as resuscitation began.  Given repeat doses of 2 mg of Narcan with concern for clonidine overdose.  No significant increase in blood pressure with initiation of fluid and clonidine, and given that she was started on a Levophed drip.  Discussed with poison control.   Blood pressures improved with IV fluid and Levophed.  At this time was able to titrate down levophed dosing, will continue to monitor, on nasal cannula, IV fluid.  PCCM to evaluate.  Labs completed and personally evaluated interpreted by me showing normal INR, negative acetaminophen level nearly 4 hours after ingestion, ethanol level of 116, mild leukocytosis, mild anemia, acute kidney injury with a creatinine of 1.1, negative salicylate level, negative COVID and flu testing.  Did have initial i-STAT labs that were inaccurate.  EKG without significant abnormalities.  Will place IVC given concern for self harm and overdose.        Final Clinical Impression(s) / ED Diagnoses Final diagnoses:  Intentional drug overdose, initial encounter ([place])  Clonidine overdose, undetermined intent, initial encounter  Hypotension due to drugs    Rx / DC Orders ED Discharge Orders     None         Gareth Morgan, MD  07/15/22 2354  

## 2022-07-15 NOTE — H&P (Signed)
NAME:  Margaret Mccann, MRN:  [id number], DOB:  22-Mar-1971, LOS: 0 ADMISSION DATE:  07/15/2022, CONSULTATION DATE:  07/15/22 REFERRING MD:  Dr. Dalene Seltzer ED, CHIEF COMPLAINT:  overdose   History of Present Illness:  51 year old woman presented to the ED after taking or intentionally overdosing on clonidine tablets whom we are admitting to the ICU with ongoing hypotension requiring vasopressor support.  Patient got into an argument with her mother.  She notes her mother has bipolar.  She has been off her medications.  Mother demanded that she leave the house.  She lives with the mother and stepfather.  She became very upset that she was told to leave.  She has nowhere to go.  Nowhere else to live.  Is response to this, impulsively, she took a handful of clonidine.  Unsure quantity.  Not the whole bottle.  She says more than 10 0.2 mg tablets.  This is similar to what she told EMS.  She told ED doctor that she took less than this.  On arrival, heart rate in the 60s.  She was hypotensive.  She was placed on norepinephrine.  Her blood pressures normalized.  Labs notable for mild leukocytosis.  Creatinine mildly elevated from baseline, 1.13 from 0.8 12/2021.  LFTs normal.  Salicylate and Tylenol level normal.  Urine drug screen is positive for cocaine.  Chest x-ray is clear.  Pertinent  Medical History  Polysubstance abuse, insomnia  Significant Hospital Events: Including procedures, antibiotic start and stop dates in addition to other pertinent events   07/25/2022 presented to ED with concern for clonidine overdose, admitted to the ICU with hypotension on vasopressors  Interim History / Subjective:    Objective   Blood pressure 101/69, pulse 68, temperature 97.8 F (36.6 C), temperature source Oral, resp. rate 18, last menstrual period 10/27/2019, SpO2 94 %.       No intake or output data in the 24 hours ending 07/15/22 2315 There were no vitals filed for this  visit.  Examination: General: Lying in bed, no acute distress HENT: EOMI, dry mucous membranes Lungs: Clear, normal work of breathing on nasal cannula Cardiovascular: Regular rate and rhythm, no murmur Abdomen: Nontender, nondistended Extremities: Cool, no edema Neuro: Alert, oriented, follows commands in all fours  Resolved Hospital Problem list     Assessment & Plan:  Intentional clonidine overdose: She denies current SI or HI. -- ED doctor completing IVC paperwork -- One-to-one sitter, suicide/IVC precautions -- Psychiatry consult in the morning  Hypotension: Related to ingestion of multiple tablets of clonidine.  Baseline has hypertension on other antihypertensives as well. -- Norepinephrine, MAP goal greater than 65 -- Status post 2 L IV fluid bolus  Insomnia: -- Resume Remeron, gabapentin, start melatonin  Anxiety/depression: -- Resume desvenlafaxine, nonformulary, discussed with pharmacy dose equivalent of venlafaxine if needed in the morning  History of hypertension: Hold antihypertensives.  Best Practice (right click and "Reselect all SmartList Selections" daily)   Diet/type: Regular consistency (see orders) DVT prophylaxis: LMWH GI prophylaxis: PPI Lines: N/A Foley:  N/A Code Status:  full code Last date of multidisciplinary goals of care discussion [discussed with patient in the ED need for monitoring in ICU for vasopressor support and hypotension, necessity of psychiatry evaluation prior to discharge which she agrees with, ED physician initiating IVC paperwork]  Labs   CBC: Recent Labs  Lab 07/15/22 2106 07/15/22 2126 07/15/22 2130  WBC  --   --  13.4*  NEUTROABS  --   --  8.0*  HGB 5.8* 10.5* 11.3*  HCT 17.0* 31.0* 34.4*  MCV  --   --  94.8  PLT  --   --  306    Basic Metabolic Panel: Recent Labs  Lab 07/15/22 2106 07/15/22 2126 07/15/22 2130  NA 150* 141 141  K 2.2* 5.3* 4.1  CL 122* 108 111  CO2  --   --  21*  GLUCOSE 61* 105* 111*   BUN 6 19 15   CREATININE 0.50 1.40* 1.13*  CALCIUM  --   --  8.4*   GFR: CrCl cannot be calculated (Unknown ideal weight.). Recent Labs  Lab 07/15/22 2130  WBC 13.4*    Liver Function Tests: Recent Labs  Lab 07/15/22 2130  AST 32  ALT 38  ALKPHOS 89  BILITOT 0.4  PROT 6.5  ALBUMIN 3.7   No results for input(s): "LIPASE", "AMYLASE" in the last 168 hours. No results for input(s): "AMMONIA" in the last 168 hours.  ABG    Component Value Date/Time   PHART 7.274 (L) 12/18/2020 0430   PCO2ART 50.9 (H) 12/18/2020 0430   PO2ART 414 (H) 12/18/2020 0430   HCO3 23.1 07/15/2022 2115   TCO2 24 07/15/2022 2126   ACIDBASEDEF 2.0 07/15/2022 2115   O2SAT 87.3 07/15/2022 2115     Coagulation Profile: Recent Labs  Lab 07/15/22 2130  INR 1.1    Cardiac Enzymes: No results for input(s): "CKTOTAL", "CKMB", "CKMBINDEX", "TROPONINI" in the last 168 hours.  HbA1C: Hgb A1c MFr Bld  Date/Time Value Ref Range Status  12/19/2021 09:55 AM 6.1 (H) 4.8 - 5.6 % Final    Comment:             Prediabetes: 5.7 - 6.4          Diabetes: >6.4          Glycemic control for adults with diabetes: <7.0   12/19/2020 12:05 AM 5.6 4.8 - 5.6 % Final    Comment:    (NOTE) Pre diabetes:          5.7%-6.4%  Diabetes:              >6.4%  Glycemic control for   <7.0% adults with diabetes     CBG: No results for input(s): "GLUCAP" in the last 168 hours.  Review of Systems:   No chest pain with exertion.  No orthopnea or PND.  No dysuria.  No cough or fever.  Comprehensive review of systems otherwise negative.  Past Medical History:  She,  has a past medical history of Alcohol withdrawal seizure with complication, with unspecified complication (HCC) (12/23/2020), Anxiety, Bipolar 1 disorder (HCC), Depression, Intentional drug overdose (HCC) (08/04/2020), Major depressive disorder, recurrent episode with anxious distress (HCC) (08/11/2020), Morgellons syndrome, MRSA (methicillin resistant  Staphylococcus aureus), Suicide attempt (HCC), Suicide by drug overdose (HCC) (08/11/2020), and Tricuspid valve regurgitation (12/18/2020).   Surgical History:   Past Surgical History:  Procedure Laterality Date   APPENDECTOMY       Social History:   reports that she has been smoking cigarettes. She has been smoking an average of .5 packs per day. She has never used smokeless tobacco. She reports current alcohol use. She reports that she does not currently use drugs.   Family History:  Her family history includes Breast cancer in her paternal aunt; Heart attack (age of onset: 22) in her father; Severe combined immunodeficiency in her sister; Stroke in her father. There is no history of Ulcerative colitis or Esophageal cancer.  Allergies Allergies  Allergen Reactions   Lamictal [Lamotrigine] Anaphylaxis, Rash and Other (See Comments)    Stevens-Johnson syndrome and fevers, also   Lamictal [Lamotrigine] Anaphylaxis, Rash and Other (See Comments)    Fevers and Stevens-Johnson syndrome, also   Tramadol Other (See Comments)    Pt had a seizure after taking Tramadol!!   Geodon [Ziprasidone Hcl] Rash   Levetiracetam Anxiety     Home Medications  Prior to Admission medications   Medication Sig Start Date End Date Taking? Authorizing Provider  atorvastatin (LIPITOR) 20 MG tablet Take 1 tablet (20 mg total) by mouth at bedtime. 04/24/22   Storm Frisk, MD  cloNIDine (CATAPRES) 0.2 MG tablet TAKE 1 TABLET BY MOUTH AT BEDTIME 06/30/22   Storm Frisk, MD  desvenlafaxine (PRISTIQ) 25 MG 24 hr tablet Take 3 tablets (75 mg total) by mouth daily. 06/15/22   Shanna Cisco, NP  gabapentin (NEURONTIN) 600 MG tablet Take 2 tablets (1,200 mg total) by mouth 2 (two) times daily. 06/15/22   Shanna Cisco, NP  hydrOXYzine (ATARAX) 25 MG tablet Take 1 tablet (25 mg total) by mouth 3 (three) times daily as needed. 06/15/22   Shanna Cisco, NP  Insulin Pen Needle 31G X 5 MM MISC Use  with ozempic pen as directed 04/25/22   Storm Frisk, MD  mirtazapine (REMERON) 30 MG tablet Take 1 tablet (30 mg total) by mouth at bedtime. 07/03/22   Shanna Cisco, NP  norethindrone (MICRONOR) 0.35 MG tablet Take 1 tablet (0.35 mg total) by mouth daily. 04/24/22   Storm Frisk, MD  OLANZapine (ZYPREXA) 2.5 MG tablet Take 1 tablet (2.5 mg total) by mouth at bedtime as needed (insomnia/sleep). 06/15/22   Shanna Cisco, NP  pantoprazole (PROTONIX) 20 MG tablet Take 1 tablet (20 mg total) by mouth daily. 04/24/22   Storm Frisk, MD  propranolol (INDERAL) 10 MG tablet Take 1 tablet (10 mg total) by mouth once every morning. 06/27/22   Storm Frisk, MD  Semaglutide,0.25 or 0.5MG /DOS, (OZEMPIC, 0.25 OR 0.5 MG/DOSE,) 2 MG/3ML SOPN Take 0.25mg  weekly for one week then increase to 0.5 mg weekly and stay at that dose 04/25/22   Storm Frisk, MD  valsartan (DIOVAN) 80 MG tablet Take 1 tablet (80 mg total) by mouth daily. 04/24/22   Storm Frisk, MD     Critical care time:     CRITICAL CARE Performed by: Karren Burly   Total critical care time: 40 minutes  Critical care time was exclusive of separately billable procedures and treating other patients.  Critical care was necessary to treat or prevent imminent or life-threatening deterioration.  Critical care was time spent personally by me on the following activities: development of treatment plan with patient and/or surrogate as well as nursing, discussions with consultants, evaluation of patient's response to treatment, examination of patient, obtaining history from patient or surrogate, ordering and performing treatments and interventions, ordering and review of laboratory studies, ordering and review of radiographic studies, pulse oximetry and re-evaluation of patient's condition.  Karren Burly, MD See Loretha Stapler for contact info

## 2022-07-16 DIAGNOSIS — F332 Major depressive disorder, recurrent severe without psychotic features: Secondary | ICD-10-CM | POA: Diagnosis not present

## 2022-07-16 DIAGNOSIS — F411 Generalized anxiety disorder: Secondary | ICD-10-CM | POA: Diagnosis not present

## 2022-07-16 DIAGNOSIS — T465X2A Poisoning by other antihypertensive drugs, intentional self-harm, initial encounter: Secondary | ICD-10-CM | POA: Diagnosis not present

## 2022-07-16 DIAGNOSIS — R45851 Suicidal ideations: Secondary | ICD-10-CM

## 2022-07-16 DIAGNOSIS — F4310 Post-traumatic stress disorder, unspecified: Secondary | ICD-10-CM

## 2022-07-16 DIAGNOSIS — T465X1A Poisoning by other antihypertensive drugs, accidental (unintentional), initial encounter: Secondary | ICD-10-CM | POA: Diagnosis present

## 2022-07-16 DIAGNOSIS — T50902A Poisoning by unspecified drugs, medicaments and biological substances, intentional self-harm, initial encounter: Secondary | ICD-10-CM

## 2022-07-16 DIAGNOSIS — I952 Hypotension due to drugs: Secondary | ICD-10-CM

## 2022-07-16 LAB — BASIC METABOLIC PANEL
Anion gap: 11 (ref 5–15)
BUN: 15 mg/dL (ref 6–20)
CO2: 20 mmol/L — ABNORMAL LOW (ref 22–32)
Calcium: 8.5 mg/dL — ABNORMAL LOW (ref 8.9–10.3)
Chloride: 111 mmol/L (ref 98–111)
Creatinine, Ser: 1.25 mg/dL — ABNORMAL HIGH (ref 0.44–1.00)
GFR, Estimated: 53 mL/min — ABNORMAL LOW (ref >60)
Glucose, Bld: 93 mg/dL (ref 70–99)
Potassium: 4.4 mmol/L (ref 3.5–5.1)
Sodium: 142 mmol/L (ref 135–145)

## 2022-07-16 LAB — RAPID URINE DRUG SCREEN, HOSP PERFORMED
Amphetamines: NOT DETECTED
Barbiturates: NOT DETECTED
Benzodiazepines: NOT DETECTED
Cocaine: POSITIVE — AB
Opiates: NOT DETECTED
Tetrahydrocannabinol: NOT DETECTED

## 2022-07-16 LAB — CREATININE, SERUM
Creatinine, Ser: 0.88 mg/dL (ref 0.44–1.00)
GFR, Estimated: >60 mL/min (ref >60)

## 2022-07-16 LAB — CBC
HCT: 33.6 % — ABNORMAL LOW (ref 36.0–46.0)
Hemoglobin: 11.1 g/dL — ABNORMAL LOW (ref 12.0–15.0)
MCH: 31.2 pg (ref 26.0–34.0)
MCHC: 33 g/dL (ref 30.0–36.0)
MCV: 94.4 fL (ref 80.0–100.0)
Platelets: 267 10*3/uL (ref 150–400)
RBC: 3.56 MIL/uL — ABNORMAL LOW (ref 3.87–5.11)
RDW: 13.2 % (ref 11.5–15.5)
WBC: 12.5 10*3/uL — ABNORMAL HIGH (ref 4.0–10.5)
nRBC: 0 % (ref 0.0–0.2)

## 2022-07-16 LAB — HIV ANTIBODY (ROUTINE TESTING W REFLEX): HIV Screen 4th Generation wRfx: NONREACTIVE

## 2022-07-16 LAB — MRSA NEXT GEN BY PCR, NASAL: MRSA by PCR Next Gen: NOT DETECTED

## 2022-07-16 LAB — MAGNESIUM: Magnesium: 2.3 mg/dL (ref 1.7–2.4)

## 2022-07-16 MED ORDER — VENLAFAXINE HCL ER 75 MG PO CP24
75.0000 mg | ORAL_CAPSULE | Freq: Every day | ORAL | Status: DC
  Administered 2022-07-17 – 2022-07-18 (×2): 75 mg via ORAL
  Filled 2022-07-16 (×2): qty 1

## 2022-07-16 MED ORDER — VENLAFAXINE HCL ER 75 MG PO CP24
75.0000 mg | ORAL_CAPSULE | Freq: Every day | ORAL | Status: DC
  Administered 2022-07-16: 75 mg via ORAL
  Filled 2022-07-16: qty 1

## 2022-07-16 MED ORDER — ONDANSETRON HCL 4 MG/2ML IJ SOLN: 4.0000 mg | Freq: Four times a day (QID) | INTRAMUSCULAR | Status: DC | PRN

## 2022-07-16 MED ORDER — MELATONIN 3 MG PO TABS
3.0000 mg | ORAL_TABLET | Freq: Every day | ORAL | Status: DC
  Administered 2022-07-16 – 2022-07-17 (×2): 3 mg via ORAL
  Filled 2022-07-16 (×3): qty 1

## 2022-07-16 MED ORDER — ACETAMINOPHEN 325 MG PO TABS
650.0000 mg | ORAL_TABLET | ORAL | Status: DC | PRN
  Administered 2022-07-16: 650 mg via ORAL
  Filled 2022-07-16: qty 2

## 2022-07-16 MED ORDER — ORAL CARE MOUTH RINSE
15.0000 mL | OROMUCOSAL | Status: DC
  Administered 2022-07-16 – 2022-07-18 (×4): 15 mL via OROMUCOSAL

## 2022-07-16 MED ORDER — GABAPENTIN 300 MG PO CAPS
600.0000 mg | ORAL_CAPSULE | Freq: Two times a day (BID) | ORAL | Status: DC
  Administered 2022-07-16 – 2022-07-18 (×4): 600 mg via ORAL
  Filled 2022-07-16 (×4): qty 2

## 2022-07-16 MED ORDER — SODIUM CHLORIDE 0.9 % IV SOLN
250.0000 mL | INTRAVENOUS | Status: DC
  Administered 2022-07-16: 250 mL via INTRAVENOUS

## 2022-07-16 MED ORDER — POLYETHYLENE GLYCOL 3350 17 G PO PACK: 17.0000 g | PACK | Freq: Every day | ORAL | Status: DC | PRN

## 2022-07-16 MED ORDER — CHLORHEXIDINE GLUCONATE CLOTH 2 % EX PADS
6.0000 | MEDICATED_PAD | Freq: Every day | CUTANEOUS | Status: DC
  Administered 2022-07-16 – 2022-07-18 (×3): 6 via TOPICAL

## 2022-07-16 MED ORDER — MIRTAZAPINE 30 MG PO TABS: 30.0000 mg | ORAL_TABLET | Freq: Every day | ORAL | Status: DC

## 2022-07-16 MED ORDER — ENOXAPARIN SODIUM 40 MG/0.4ML IJ SOSY
40.0000 mg | PREFILLED_SYRINGE | INTRAMUSCULAR | Status: DC
  Administered 2022-07-16 – 2022-07-18 (×3): 40 mg via SUBCUTANEOUS
  Filled 2022-07-16 (×3): qty 0.4

## 2022-07-16 MED ORDER — DOCUSATE SODIUM 100 MG PO CAPS: 100.0000 mg | ORAL_CAPSULE | Freq: Two times a day (BID) | ORAL | Status: DC | PRN

## 2022-07-16 MED ORDER — DESVENLAFAXINE SUCCINATE ER 50 MG PO TB24
75.0000 mg | ORAL_TABLET | Freq: Every day | ORAL | Status: DC
  Filled 2022-07-16 (×2): qty 1

## 2022-07-16 MED ORDER — PANTOPRAZOLE SODIUM 20 MG PO TBEC
20.0000 mg | DELAYED_RELEASE_TABLET | Freq: Every day | ORAL | Status: DC
  Administered 2022-07-16 – 2022-07-18 (×3): 20 mg via ORAL
  Filled 2022-07-16 (×3): qty 1

## 2022-07-16 MED ORDER — NOREPINEPHRINE 4 MG/250ML-% IV SOLN
2.0000 ug/min | INTRAVENOUS | Status: DC
  Administered 2022-07-16: 2 ug/min via INTRAVENOUS

## 2022-07-16 MED ORDER — MUPIROCIN 2 % EX OINT
1.0000 | TOPICAL_OINTMENT | Freq: Two times a day (BID) | CUTANEOUS | Status: DC
  Administered 2022-07-16 – 2022-07-18 (×5): 1 via NASAL
  Filled 2022-07-16 (×2): qty 22

## 2022-07-16 MED ORDER — OLANZAPINE 2.5 MG PO TABS
2.5000 mg | ORAL_TABLET | Freq: Every evening | ORAL | Status: DC | PRN
  Administered 2022-07-17: 2.5 mg via ORAL
  Filled 2022-07-16 (×2): qty 1

## 2022-07-16 MED ORDER — GABAPENTIN 400 MG PO CAPS: 1200.0000 mg | ORAL_CAPSULE | Freq: Every day | ORAL | Status: DC

## 2022-07-16 MED ORDER — HYDROXYZINE HCL 25 MG PO TABS
25.0000 mg | ORAL_TABLET | Freq: Three times a day (TID) | ORAL | Status: DC | PRN
  Administered 2022-07-16 – 2022-07-18 (×6): 25 mg via ORAL
  Filled 2022-07-16 (×7): qty 1

## 2022-07-16 MED ORDER — ATORVASTATIN CALCIUM 10 MG PO TABS
20.0000 mg | ORAL_TABLET | Freq: Every day | ORAL | Status: DC
  Administered 2022-07-16 – 2022-07-17 (×2): 20 mg via ORAL
  Filled 2022-07-16 (×2): qty 2

## 2022-07-16 MED ORDER — CHLORHEXIDINE GLUCONATE CLOTH 2 % EX PADS: 6.0000 | MEDICATED_PAD | Freq: Every day | CUTANEOUS | Status: DC

## 2022-07-16 MED ORDER — ORAL CARE MOUTH RINSE: 15.0000 mL | OROMUCOSAL | Status: DC | PRN

## 2022-07-16 MED ORDER — DESVENLAFAXINE SUCCINATE ER 50 MG PO TB24: 75.0000 mg | ORAL_TABLET | Freq: Every day | ORAL | Status: DC

## 2022-07-16 NOTE — Progress Notes (Signed)
NAME:  Margaret Mccann, MRN:  [id number], DOB:  [birth year], LOS: 0 ADMISSION DATE:  07/15/2022, CONSULTATION DATE:  07/15/22 REFERRING MD:  Dr. Dalene Seltzer ED, CHIEF COMPLAINT:  overdose   History of Present Illness:  51 yo female smoker presented to ER after intentional overdose of clonidine.  In ER she had hypotension and started on pressors.  UDS positive for cocaine, ETOH level 116.  PCCM consulted to admit to ICU.  Pertinent  Medical History  Polysubstance abuse with cocaine/amphetamine/ETOH, Depression,, Anxiety, PTSD, Insomnia  Significant Hospital Events: Including procedures, antibiotic start and stop dates in addition to other pertinent events   07/25/2022 presented to ED with concern for clonidine overdose, admitted to the ICU with hypotension on vasopressors  Interim History / Subjective:    Objective   Blood pressure (!) 130/104, pulse (!) 56, temperature (!) 97.5 F (36.4 C), temperature source Oral, resp. rate 16, last menstrual period 10/27/2019, SpO2 94 %.       No intake or output data in the 24 hours ending 07/16/22 0813 There were no vitals filed for this visit.  Examination: General: Lying in bed, no acute distress HENT: EOMI, dry mucous membranes Lungs: Clear, normal work of breathing on nasal cannula Cardiovascular: Regular rate and rhythm, no murmur Abdomen: Nontender, nondistended Extremities: Cool, no edema Neuro: Alert, oriented, follows commands in all fours  Resolved Hospital Problem list   Hypernatremai, Hypokalemia, Acute renal insufficiency from hypovolemia  Assessment & Plan:   Intentional clonidine overdose. Hx of Depression, Anxiety, PTSD, Insomnia, Polysubstance abuse. - Involuntary commitment, sitter at bedside - psychiatry consulted; followed by [facility] as outpt - resume outpt neurontin, remeron, pristiq, zyprexa, vistaril  Hypotension from clonidine overdose. Hx of HTN, HLD. - pressors to keep MAP > 65, SBP > 90 -  continue IV fluids - continue lipitor - hold outpt clonidine, propranolol, diovan  Best Practice (right click and "Reselect all SmartList Selections" daily)   Diet/type: Regular consistency (see orders) DVT prophylaxis: LMWH GI prophylaxis: PPI Lines: N/A Foley:  N/A Code Status:  full code Last date of multidisciplinary goals of care discussion [should be ready for transfer to inpatient behavioral health once she is off pressors]  Labs       Latest Ref Rng & Units 07/16/2022    1:13 AM 07/15/2022    9:30 PM 07/15/2022    9:26 PM  CMP  Glucose 70 - 99 mg/dL  213  086   BUN 6 - 20 mg/dL  15  19   Creatinine 5.78 - 1.00 mg/dL 4.69  6.29  5.28   Sodium 135 - 145 mmol/L  141  141   Potassium 3.5 - 5.1 mmol/L  4.1  5.3   Chloride 98 - 111 mmol/L  111  108   CO2 22 - 32 mmol/L  21    Calcium 8.9 - 10.3 mg/dL  8.4    Total Protein 6.5 - 8.1 g/dL  6.5    Total Bilirubin 0.3 - 1.2 mg/dL  0.4    Alkaline Phos 38 - 126 U/L  89    AST 15 - 41 U/L  32    ALT 0 - 44 U/L  38         Latest Ref Rng & Units 07/16/2022    1:13 AM 07/15/2022    9:30 PM 07/15/2022    9:26 PM  CBC  WBC 4.0 - 10.5 K/uL 12.5  13.4    Hemoglobin 12.0 - 15.0 g/dL 11.1  11.3  10.5   Hematocrit 36.0 - 46.0 % 33.6  34.4  31.0   Platelets 150 - 400 K/uL 267  306      Critical care time: 32 minutes  Chesley Mires, MD [place] Pager - [phone], or [phone] 07/16/2022, 8:23 AM

## 2022-07-16 NOTE — Progress Notes (Signed)
An USGPIV (ultrasound guided PIV) has been placed for short-term vasopressor infusion. A correctly placed ivWatch must be used when administering Vasopressors. Should this treatment be needed beyond 72 hours, central line access should be obtained.  It will be the responsibility of the bedside nurse to follow best practice to prevent extravasations.   ?

## 2022-07-16 NOTE — Progress Notes (Signed)
eLink Physician-Brief Progress Note Patient Name: Margaret Mccann DOB: 27-Dec-1970 MRN: [id number]   Date of Service  07/16/2022  HPI/Events of Note  Admitted to ICU for intentional clonidine overdose. Given fluids and ended up on pressors. Has been on 2-4 mic/min of levophed. Now in ICU. No distress on camera, being changed. HR in 90s. Awake and alert  eICU Interventions  Fluids, pressors and psych consult in AM Remainder per CCM notes Call E link if needed     Intervention Category Major Interventions: Shock - evaluation and management Evaluation Type: New Patient Evaluation  [name] 07/16/2022, 4:01 AM
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
## 2022-07-17 DIAGNOSIS — T465X2A Poisoning by other antihypertensive drugs, intentional self-harm, initial encounter: Secondary | ICD-10-CM | POA: Diagnosis not present

## 2022-07-17 LAB — CBC
HCT: 36.6 % (ref 36.0–46.0)
Hemoglobin: 12.1 g/dL (ref 12.0–15.0)
MCH: 31 pg (ref 26.0–34.0)
MCHC: 33.1 g/dL (ref 30.0–36.0)
MCV: 93.8 fL (ref 80.0–100.0)
Platelets: 222 10*3/uL (ref 150–400)
RBC: 3.9 MIL/uL (ref 3.87–5.11)
RDW: 13 % (ref 11.5–15.5)
WBC: 6.2 10*3/uL (ref 4.0–10.5)
nRBC: 0 % (ref 0.0–0.2)

## 2022-07-17 LAB — BASIC METABOLIC PANEL
Anion gap: 7 (ref 5–15)
BUN: 9 mg/dL (ref 6–20)
CO2: 22 mmol/L (ref 22–32)
Calcium: 9 mg/dL (ref 8.9–10.3)
Chloride: 108 mmol/L (ref 98–111)
Creatinine, Ser: 0.85 mg/dL (ref 0.44–1.00)
GFR, Estimated: >60 mL/min (ref >60)
Glucose, Bld: 117 mg/dL — ABNORMAL HIGH (ref 70–99)
Potassium: 4.5 mmol/L (ref 3.5–5.1)
Sodium: 137 mmol/L (ref 135–145)

## 2022-07-17 LAB — MAGNESIUM: Magnesium: 1.9 mg/dL (ref 1.7–2.4)

## 2022-07-17 NOTE — Consult Note (Signed)
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 patient is nervous/anxious and has insomnia.   All other systems reviewed and are negative.  Blood pressure (!) 140/81, pulse (!) 55, temperature 97.7 F (36.5 C), temperature source Oral, resp. rate 14, weight 84.5 kg, last menstrual period  10/27/2019, SpO2 97 %. Body mass index is 33 kg/m.  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  -continue 1:1 sitter -consult social work to seek psychiatric inpatient when medically cleared -hold clonidine -c gabapentin 600mg  po BID.  -dc effexor -home pristiq 75 mg once daily -Consider resuming mirtazapine.   Disposition: Recommend psychiatric Inpatient admission when medically cleared.  Maryagnes Amosakia S Starkes-Perry, FNP
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
## 2022-07-17 NOTE — Progress Notes (Signed)
Pt very anxious and shaky after being served paperwork from [employer]. She states, "Now I feel more suicidal than ever". Emotional support given, sitter remains in place. RN consulted chaplain, who came to provide emotional support and pray with patient.

## 2022-07-17 NOTE — Progress Notes (Signed)
   NAME:  Margaret Mccann, MRN:  [id number], DOB:  [birth year], LOS: 1 ADMISSION DATE:  07/15/2022, CONSULTATION DATE:  07/15/2022 REFERRING MD: Billy Fischer - EDP CHIEF COMPLAINT: Intentional Overdose   History of Present Illness:  51 yo female smoker presented to ER after intentional overdose of clonidine.  In ER she had hypotension and started on pressors.  UDS positive for cocaine, ETOH level 116.  PCCM consulted to admit to ICU.  Pertinent Medical History:  Polysubstance abuse with cocaine/amphetamine/ETOH, Depression, Anxiety, PTSD, Insomnia  Significant Hospital Events: Including procedures, antibiotic start and stop dates in addition to other pertinent events   07/15/2022 presented to ED with concern for clonidine overdose, admitted to the ICU with hypotension on vasopressors 10/22 Off of vasopressors as of ~1400 10/23 Patient remains off of vasopressors, medically stable for discharge to inpatient psych.  Interim History / Subjective:  No significant events overnight Patient states that she is anxious this morning despite Vistaril administration, requesting home gabapentin Remains mildly bradycardic in 50s, BP has improved and no longer hypotensive or requiring pressors ?Risk of rebound hypertension off of clonidine Appears medically stable for transfer/discharge to inpatient psych/behavioral health  Objective:  Blood pressure 122/70, pulse (!) 50, temperature 98.2 F (36.8 C), temperature source Oral, resp. rate 15, weight 84.5 kg, last menstrual period 10/27/2019, SpO2 96 %.        Intake/Output Summary (Last 24 hours) at 07/17/2022 6378 Last data filed at 07/17/2022 0000 Gross per 24 hour  Intake 495.04 ml  Output --  Net 495.04 ml   Filed Weights   07/17/22 0600  Weight: 84.5 kg   Physical Examination: General: Overall well-appearing middle-aged woman in NAD, appears anxious. HEENT: [place]/AT, anicteric sclera, PERRL, moist mucous membranes. Neuro: Awake,  oriented x 4. Responds to verbal stimuli. Following commands consistently. Moves all 4 extremities spontaneously.  CV: Mildly bradycardic to 50s, regular rhythm, no m/g/r. PULM: Breathing even and unlabored on RA. Lung fields CTAB. GI: Soft, nontender, nondistended. Normoactive bowel sounds. Extremities: No LE edema noted. Skin: Warm/dry, no rashes.  Resolved Hospital Problem List:   Hypernatremai, Hypokalemia, Acute renal insufficiency from hypovolemia  Assessment & Plan:   Intentional clonidine overdose. Hx of Depression, Anxiety, PTSD, Insomnia, Polysubstance abuse. Followed by [facility] as outpatient. - IVC in place, sitter at bedside - Psychiatry consulted, following - Resume outpatient gabapentin, Pristiq (temporary substitution with Effexor, per Psych), Vistaril, hold Remeron for now - Medically stable for transfer/discharge to Behavioral Health/Inpatient Psych  Hypotension from clonidine overdose. Hx of HTN, HLD. - Remains off of pressors > 24H - No longer requiring MIVF - Continue statin - Holding outpatient clonidine, propranolol, Diovan - Resume clonidine as clinically appropriate, given risk of rebound hypertension  Best Practice (right click and "Reselect all SmartList Selections" daily)   Diet/type: Regular consistency (see orders) DVT prophylaxis: LMWH GI prophylaxis: PPI Lines: N/A Foley:  N/A Code Status:  full code Last date of multidisciplinary goals of care discussion [Full Code - Medically stable for transfer to inpatient psych/Behavioral Health]  Signature:   Rhae Lerner [facility] 07/17/22 7:23 AM  Please see [email or web address] for pager details.  From 7A-7P if no response, please call [phone] After hours, please call ELink [phone]

## 2022-07-18 ENCOUNTER — Encounter (HOSPITAL_COMMUNITY): Payer: Self-pay | Admitting: Psychiatry

## 2022-07-18 ENCOUNTER — Other Ambulatory Visit: Payer: Self-pay

## 2022-07-18 ENCOUNTER — Inpatient Hospital Stay (HOSPITAL_COMMUNITY)
Admission: AD | Admit: 2022-07-18 | Discharge: 2022-07-21 | DRG: 885 | Disposition: A | Discharge status: Home / Self Care | Payer: Federal, State, Local not specified - Other | Source: Intra-hospital | Attending: Psychiatry | Admitting: Psychiatry

## 2022-07-18 DIAGNOSIS — Z5900 Homelessness unspecified: Secondary | ICD-10-CM

## 2022-07-18 DIAGNOSIS — F4310 Post-traumatic stress disorder, unspecified: Secondary | ICD-10-CM | POA: Diagnosis present

## 2022-07-18 DIAGNOSIS — E7800 Pure hypercholesterolemia, unspecified: Secondary | ICD-10-CM | POA: Diagnosis present

## 2022-07-18 DIAGNOSIS — Z720 Tobacco use: Secondary | ICD-10-CM | POA: Diagnosis present

## 2022-07-18 DIAGNOSIS — F10139 Alcohol abuse with withdrawal, unspecified: Secondary | ICD-10-CM | POA: Diagnosis present

## 2022-07-18 DIAGNOSIS — Z818 Family history of other mental and behavioral disorders: Secondary | ICD-10-CM | POA: Diagnosis not present

## 2022-07-18 DIAGNOSIS — Z9151 Personal history of suicidal behavior: Secondary | ICD-10-CM

## 2022-07-18 DIAGNOSIS — Z87898 Personal history of other specified conditions: Secondary | ICD-10-CM

## 2022-07-18 DIAGNOSIS — Z87820 Personal history of traumatic brain injury: Secondary | ICD-10-CM

## 2022-07-18 DIAGNOSIS — F411 Generalized anxiety disorder: Secondary | ICD-10-CM | POA: Diagnosis present

## 2022-07-18 DIAGNOSIS — Z23 Encounter for immunization: Secondary | ICD-10-CM

## 2022-07-18 DIAGNOSIS — K219 Gastro-esophageal reflux disease without esophagitis: Secondary | ICD-10-CM | POA: Diagnosis present

## 2022-07-18 DIAGNOSIS — F1410 Cocaine abuse, uncomplicated: Secondary | ICD-10-CM | POA: Diagnosis present

## 2022-07-18 DIAGNOSIS — I10 Essential (primary) hypertension: Secondary | ICD-10-CM | POA: Diagnosis present

## 2022-07-18 DIAGNOSIS — Z79899 Other long term (current) drug therapy: Secondary | ICD-10-CM

## 2022-07-18 DIAGNOSIS — Z20822 Contact with and (suspected) exposure to covid-19: Secondary | ICD-10-CM | POA: Diagnosis present

## 2022-07-18 DIAGNOSIS — T465X2A Poisoning by other antihypertensive drugs, intentional self-harm, initial encounter: Secondary | ICD-10-CM | POA: Diagnosis present

## 2022-07-18 DIAGNOSIS — Z87891 Personal history of nicotine dependence: Secondary | ICD-10-CM | POA: Diagnosis not present

## 2022-07-18 DIAGNOSIS — F332 Major depressive disorder, recurrent severe without psychotic features: Secondary | ICD-10-CM | POA: Diagnosis present

## 2022-07-18 DIAGNOSIS — K30 Functional dyspepsia: Secondary | ICD-10-CM | POA: Diagnosis present

## 2022-07-18 DIAGNOSIS — F1090 Alcohol use, unspecified, uncomplicated: Secondary | ICD-10-CM

## 2022-07-18 DIAGNOSIS — T50902A Poisoning by unspecified drugs, medicaments and biological substances, intentional self-harm, initial encounter: Secondary | ICD-10-CM

## 2022-07-18 LAB — SARS CORONAVIRUS 2 BY RT PCR: SARS Coronavirus 2 by RT PCR: NEGATIVE

## 2022-07-18 MED ORDER — HYDROXYZINE HCL 25 MG PO TABS
50.0000 mg | ORAL_TABLET | Freq: Once | ORAL | Status: AC
  Administered 2022-07-18: 50 mg via ORAL
  Filled 2022-07-18: qty 2

## 2022-07-18 MED ORDER — LORAZEPAM 1 MG PO TABS
1.0000 mg | ORAL_TABLET | ORAL | Status: AC | PRN
  Administered 2022-07-20: 1 mg via ORAL
  Filled 2022-07-18: qty 1

## 2022-07-18 MED ORDER — OLANZAPINE 2.5 MG PO TABS
2.5000 mg | ORAL_TABLET | Freq: Every evening | ORAL | Status: DC | PRN
  Administered 2022-07-19: 2.5 mg via ORAL
  Filled 2022-07-18: qty 1

## 2022-07-18 MED ORDER — GABAPENTIN 600 MG PO TABS: 1200.0000 mg | ORAL_TABLET | Freq: Two times a day (BID) | ORAL | Status: DC

## 2022-07-18 MED ORDER — CLONIDINE HCL 0.1 MG PO TABS
0.1000 mg | ORAL_TABLET | ORAL | Status: AC
  Administered 2022-07-18: 0.1 mg via ORAL
  Filled 2022-07-18: qty 1

## 2022-07-18 MED ORDER — IRBESARTAN 75 MG PO TABS
75.0000 mg | ORAL_TABLET | Freq: Every day | ORAL | Status: DC
  Administered 2022-07-18 – 2022-07-21 (×4): 75 mg via ORAL
  Filled 2022-07-18 (×8): qty 1

## 2022-07-18 MED ORDER — VENLAFAXINE HCL ER 75 MG PO CP24: 75.0000 mg | ORAL_CAPSULE | Freq: Every day | ORAL | Status: DC

## 2022-07-18 MED ORDER — MELATONIN 3 MG PO TABS
3.0000 mg | ORAL_TABLET | Freq: Every day | ORAL | Status: DC
  Administered 2022-07-19 – 2022-07-20 (×2): 3 mg via ORAL
  Filled 2022-07-18 (×5): qty 1

## 2022-07-18 MED ORDER — PNEUMOCOCCAL 20-VAL CONJ VACC 0.5 ML IM SUSY
0.5000 mL | PREFILLED_SYRINGE | INTRAMUSCULAR | Status: AC
  Administered 2022-07-19: 0.5 mL via INTRAMUSCULAR
  Filled 2022-07-18: qty 0.5

## 2022-07-18 MED ORDER — GABAPENTIN 600 MG PO TABS
600.0000 mg | ORAL_TABLET | Freq: Once | ORAL | Status: AC
  Administered 2022-07-18: 600 mg via ORAL
  Filled 2022-07-18: qty 1

## 2022-07-18 MED ORDER — POLYETHYLENE GLYCOL 3350 17 G PO PACK: 17.0000 g | PACK | Freq: Every day | ORAL | 0 refills | Status: DC | PRN

## 2022-07-18 MED ORDER — INFLUENZA VAC SPLIT QUAD 0.5 ML IM SUSY
0.5000 mL | PREFILLED_SYRINGE | INTRAMUSCULAR | Status: AC
  Administered 2022-07-19: 0.5 mL via INTRAMUSCULAR
  Filled 2022-07-18: qty 0.5

## 2022-07-18 MED ORDER — OLANZAPINE 5 MG PO TBDP
5.0000 mg | ORAL_TABLET | Freq: Three times a day (TID) | ORAL | Status: DC | PRN
  Administered 2022-07-18: 5 mg via ORAL
  Filled 2022-07-18: qty 1

## 2022-07-18 MED ORDER — CLONIDINE HCL 0.1 MG PO TABS
ORAL_TABLET | ORAL | Status: AC
  Filled 2022-07-18: qty 1

## 2022-07-18 MED ORDER — MELATONIN 3 MG PO TABS: 3.0000 mg | ORAL_TABLET | Freq: Every day | ORAL | 0 refills | Status: DC

## 2022-07-18 MED ORDER — GABAPENTIN 400 MG PO CAPS: 1200.0000 mg | ORAL_CAPSULE | Freq: Two times a day (BID) | ORAL | Status: DC

## 2022-07-18 MED ORDER — MAGNESIUM HYDROXIDE 400 MG/5ML PO SUSP: 30.0000 mL | Freq: Every day | ORAL | Status: DC | PRN

## 2022-07-18 MED ORDER — GABAPENTIN 600 MG PO TABS
1200.0000 mg | ORAL_TABLET | Freq: Two times a day (BID) | ORAL | Status: DC
  Administered 2022-07-19: 1200 mg via ORAL
  Filled 2022-07-18 (×7): qty 2

## 2022-07-18 MED ORDER — ACETAMINOPHEN 325 MG PO TABS
650.0000 mg | ORAL_TABLET | Freq: Four times a day (QID) | ORAL | Status: DC | PRN
  Administered 2022-07-20: 650 mg via ORAL
  Filled 2022-07-18 (×2): qty 2

## 2022-07-18 MED ORDER — GABAPENTIN 300 MG PO CAPS
600.0000 mg | ORAL_CAPSULE | Freq: Once | ORAL | Status: AC
  Administered 2022-07-18: 600 mg via ORAL
  Filled 2022-07-18: qty 2

## 2022-07-18 MED ORDER — ALUM & MAG HYDROXIDE-SIMETH 200-200-20 MG/5ML PO SUSP: 30.0000 mL | ORAL | Status: DC | PRN

## 2022-07-18 MED ORDER — HYDRALAZINE HCL 25 MG PO TABS
25.0000 mg | ORAL_TABLET | Freq: Three times a day (TID) | ORAL | Status: DC | PRN
  Administered 2022-07-18: 25 mg via ORAL
  Filled 2022-07-18: qty 1

## 2022-07-18 MED ORDER — DOCUSATE SODIUM 100 MG PO CAPS: 100.0000 mg | ORAL_CAPSULE | Freq: Two times a day (BID) | ORAL | 0 refills | Status: DC | PRN

## 2022-07-18 MED ORDER — HYDROXYZINE HCL 25 MG PO TABS
25.0000 mg | ORAL_TABLET | Freq: Three times a day (TID) | ORAL | Status: DC | PRN
  Administered 2022-07-18 – 2022-07-19 (×2): 25 mg via ORAL
  Filled 2022-07-18 (×2): qty 1
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
## 2022-07-18 NOTE — TOC Transition Note (Addendum)
Transition of Care [facility]) - CM/SW Discharge Note   Patient Details  Name: Margaret Mccann MRN: [id number] Date of Birth: 1971-02-26  Transition of Care [facility]) CM/SW Contact:  Roseanne Kaufman, RN Phone Number: 07/18/2022, 4:17 PM   Clinical Narrative:   IVC paperwork has been faxed to [facility], IVC transport has been called to [facility], notified MD, RN.  IVC transport has been cancelled due to COVID test is pending. Will notify RN to call once COVID results are in.  Spoke with patient's mother [name] who reports patient can not come back to her home. This RNCM explained [facility] has an available bed today for patient. The plan is to transfer patient today. Patient's mother questioning how long patient will be at [facility], this RNCM advised unable to advise. Patient will be assessed and treated at [facility] once at facility. Patient's mother is requesting a call when patient is being transported to [place] test, RN to call [employer] , no additional TOC needs at this time.       Final next level of care: Psychiatric Hospital Barriers to Discharge: No Barriers Identified   Patient Goals and CMS Choice Patient states their goals for this hospitalization and ongoing recovery are:: inpatient psych   Choice offered to / list presented to : NA  Discharge Placement                Patient to be transferred to facility by: [employer]      Discharge Plan and Services In-house Referral: NA Discharge Planning Services: CM Consult            DME Arranged: N/A DME Agency: NA       HH Arranged: NA HH Agency: NA        Social Determinants of Health (SDOH) Interventions     Readmission Risk Interventions     No data to display

## 2022-07-18 NOTE — Hospital Course (Addendum)
51 year old female with past medical history of major depressive disorder, PTSD, alcohol abuse complicated by delirium tremens in the past (12/2020 hospitalization), cocaine abuse presenting to [facility] long hospital on 10/21 after intentionally overdosing clonidine tablets after an argument with her mother.  Upon arrival to the emergency department patient found to be hypotensive requiring initiation of norepinephrine infusion.  Patient was initially admitted to [facility] service to the intensive care unit.    Patient reported to psychiatry that she took a handful clonidine tablets in an attempt to end her life after an argument with her mother.  Because of this, patient was involuntarily committed with a 24-hour sitter activated.    Blood pressure gradually improved and patient was weaned off of norepinephrine infusion.  Patient was transferred to the hospitalist service the morning of 10/24.  Patient is involuntary commitment was continued throughout the hospitalization.  Behavioral health developed inpatient bed availability the afternoon of 10/24 the patient was discharged in stable condition into their care.

## 2022-07-18 NOTE — Discharge Summary (Signed)
Physician Discharge Summary   Patient: Margaret Mccann MRN: [id number] DOB: 05-17-1971  Admit date:     07/15/2022  Discharge date: 07/18/22  Discharge Physician: Vernelle Emerald   PCP: Elsie Stain, MD     Discharge Diagnoses: Principal Problem:   Clonidine overdose Active Problems:   Intentional drug overdose [facility])  Resolved Problems:   * No resolved hospital problems. *   Hospital Course: 51 year old female with past medical history of major depressive disorder, PTSD, alcohol abuse complicated by delirium tremens in the past (12/2020 hospitalization), cocaine abuse presenting to [facility] long hospital on 10/21 after intentionally overdosing clonidine tablets after an argument with her mother.  Upon arrival to the emergency department patient found to be hypotensive requiring initiation of norepinephrine infusion.  Patient was initially admitted to [facility] service to the intensive care unit.    Patient reported to psychiatry that she took a handful clonidine tablets in an attempt to end her life after an argument with her mother.  Because of this, patient was involuntarily committed with a 24-hour sitter activated.    Blood pressure gradually improved and patient was weaned off of norepinephrine infusion.  Patient was transferred to the hospitalist service the morning of 10/24.  Patient is involuntary commitment was continued throughout the hospitalization.  Behavioral health developed inpatient bed availability the afternoon of 10/24 the patient was discharged in stable condition into their care.     Pain control - [employer] Controlled Substance Reporting System database was reviewed. and patient was instructed, not to drive, operate heavy machinery, perform activities at heights, swimming or participation in water activities or provide baby-sitting services while on Pain, Sleep and Anxiety Medications; until their outpatient Physician has advised to do so again.  Also recommended to not to take more than prescribed Pain, Sleep and Anxiety Medications.   Consultants: Dr. Caswell Corwin with Psyschiatry Procedures performed:  None Disposition:  Behavioral Health Diet recommendation:  Discharge Diet Orders (From admission, onward)     Start     Ordered   07/18/22 0000  Diet - low sodium heart healthy        07/18/22 1507           Cardiac diet  DISCHARGE MEDICATION: Allergies as of 07/18/2022       Reactions   Lamictal [lamotrigine] Anaphylaxis, Rash, Other (See Comments)   Stevens-Johnson syndrome and fevers, also   Lamictal [lamotrigine] Anaphylaxis, Rash, Other (See Comments)   Fevers and Stevens-Johnson syndrome, also   Tramadol Other (See Comments)   Pt had a seizure after taking Tramadol!!   Geodon [ziprasidone Hcl] Rash   Levetiracetam Anxiety        Medication List     STOP taking these medications    cloNIDine 0.2 MG tablet Commonly known as: CATAPRES   desvenlafaxine 25 MG 24 hr tablet Commonly known as: PRISTIQ   hydrOXYzine 25 MG tablet Commonly known as: ATARAX   mirtazapine 30 MG tablet Commonly known as: REMERON   norethindrone 0.35 MG tablet Commonly known as: MICRONOR   Ozempic (0.25 or 0.5 MG/DOSE) 2 MG/3ML Sopn Generic drug: Semaglutide(0.25 or 0.5MG /DOS)   propranolol 10 MG tablet Commonly known as: INDERAL   TechLite Pen Needles 31G X 5 MM Misc Generic drug: Insulin Pen Needle       TAKE these medications    atorvastatin 20 MG tablet Commonly known as: LIPITOR Take 1 tablet (20 mg total) by mouth at bedtime.   docusate sodium 100 MG capsule  Commonly known as: COLACE Take 1 capsule (100 mg total) by mouth 2 (two) times daily as needed for mild constipation.   gabapentin 600 MG tablet Commonly known as: NEURONTIN Take 2 tablets (1,200 mg total) by mouth 2 (two) times daily.   melatonin 3 MG Tabs tablet Take 1 tablet (3 mg total) by mouth at bedtime.   OLANZapine 2.5 MG  tablet Commonly known as: ZyPREXA Take 1 tablet (2.5 mg total) by mouth at bedtime as needed (insomnia/sleep).   pantoprazole 20 MG tablet Commonly known as: PROTONIX Take 1 tablet (20 mg total) by mouth daily.   polyethylene glycol 17 g packet Commonly known as: MIRALAX / GLYCOLAX Take 17 g by mouth daily as needed for moderate constipation.   valsartan 80 MG tablet Commonly known as: DIOVAN Take 1 tablet (80 mg total) by mouth daily.   venlafaxine XR 75 MG 24 hr capsule Commonly known as: EFFEXOR-XR Take 1 capsule (75 mg total) by mouth daily with breakfast. Start taking on: July 19, 2022         Discharge Exam: Ceasar Mons Weights   07/17/22 0600  Weight: 84.5 kg    Constitutional: Awake alert and oriented x3, patient is notably tearful with labile affect. Respiratory: clear to auscultation bilaterally, no wheezing, no crackles. Normal respiratory effort. No accessory muscle use.  Cardiovascular: Regular rate and rhythm, no murmurs / rubs / gallops. No extremity edema. 2+ pedal pulses. No carotid bruits.  Abdomen: Abdomen is soft and nontender.  No evidence of intra-abdominal masses.  Positive bowel sounds noted in all quadrants.   Musculoskeletal: No joint deformity upper and lower extremities. Good ROM, no contractures. Normal muscle tone.     Condition at discharge: fair  The results of significant diagnostics from this hospitalization (including imaging, microbiology, ancillary and laboratory) are listed below for reference.   Imaging Studies: No results found.  Microbiology: Results for orders placed or performed during the hospital encounter of 07/15/22  Resp Panel by RT-PCR (Flu A&B, Covid) Anterior Nasal Swab     Status: None   Collection Time: 07/15/22  9:15 PM   Specimen: Anterior Nasal Swab  Result Value Ref Range Status   SARS Coronavirus 2 by RT PCR NEGATIVE NEGATIVE Final    Comment: (NOTE) SARS-CoV-2 target nucleic acids are NOT DETECTED.  The  SARS-CoV-2 RNA is generally detectable in upper respiratory specimens during the acute phase of infection. The lowest concentration of SARS-CoV-2 viral copies this assay can detect is 138 copies/mL. A negative result does not preclude SARS-Cov-2 infection and should not be used as the sole basis for treatment or other patient management decisions. A negative result may occur with  improper specimen collection/handling, submission of specimen other than nasopharyngeal swab, presence of viral mutation(s) within the areas targeted by this assay, and inadequate number of viral copies(<138 copies/mL). A negative result must be combined with clinical observations, patient history, and epidemiological information. The expected result is Negative.  Fact Sheet for Patients:  BloggerCourse.com  Fact Sheet for Healthcare Providers:  SeriousBroker.it  This test is no t yet approved or cleared by the [place] FDA and  has been authorized for detection and/or diagnosis of SARS-CoV-2 by FDA under an Emergency Use Authorization (EUA). This EUA will remain  in effect (meaning this test can be used) for the duration of the COVID-19 declaration under Section 564(b)(1) of the Act, 21 U.S.C.section 360bbb-3(b)(1), unless the authorization is terminated  or revoked sooner.       Influenza  A by PCR NEGATIVE NEGATIVE Final   Influenza B by PCR NEGATIVE NEGATIVE Final    Comment: (NOTE) The Xpert Xpress SARS-CoV-2/FLU/RSV plus assay is intended as an aid in the diagnosis of influenza from Nasopharyngeal swab specimens and should not be used as a sole basis for treatment. Nasal washings and aspirates are unacceptable for Xpert Xpress SARS-CoV-2/FLU/RSV testing.  Fact Sheet for Patients: BloggerCourse.com  Fact Sheet for Healthcare Providers: SeriousBroker.it  This test is not yet approved or  cleared by the [place] FDA and has been authorized for detection and/or diagnosis of SARS-CoV-2 by FDA under an Emergency Use Authorization (EUA). This EUA will remain in effect (meaning this test can be used) for the duration of the COVID-19 declaration under Section 564(b)(1) of the Act, 21 U.S.C. section 360bbb-3(b)(1), unless the authorization is terminated or revoked.  Performed at [facility], [address], [place], [place] [address]   MRSA Next Gen by PCR, Nasal     Status: None   Collection Time: 07/16/22  4:00 AM   Specimen: Nasal Mucosa; Nasal Swab  Result Value Ref Range Status   MRSA by PCR Next Gen NOT DETECTED NOT DETECTED Final    Comment: (NOTE) The GeneXpert MRSA Assay (FDA approved for NASAL specimens only), is one component of a comprehensive MRSA colonization surveillance program. It is not intended to diagnose MRSA infection nor to guide or monitor treatment for MRSA infections. Test performance is not FDA approved in patients less than 22 years old. Performed at [facility], [address] [place], [place] [address]     Labs: CBC: Recent Labs  Lab 07/15/22 2106 07/15/22 2126 07/15/22 2130 07/16/22 0113 07/17/22 0729  WBC  --   --  13.4* 12.5* 6.2  NEUTROABS  --   --  8.0*  --   --   HGB 5.8* 10.5* 11.3* 11.1* 12.1  HCT 17.0* 31.0* 34.4* 33.6* 36.6  MCV  --   --  94.8 94.4 93.8  PLT  --   --  306 267 222   Basic Metabolic Panel: Recent Labs  Lab 07/15/22 2106 07/15/22 2126 07/15/22 2130 07/16/22 0113 07/16/22 0500 07/17/22 0729  NA 150* 141 141  --  142 137  K 2.2* 5.3* 4.1  --  4.4 4.5  CL 122* 108 111  --  111 108  CO2  --   --  21*  --  20* 22  GLUCOSE 61* 105* 111*  --  93 117*  BUN 6 19 15   --  15 9  CREATININE 0.50 1.40* 1.13* 0.88 1.25* 0.85  CALCIUM  --   --  8.4*  --  8.5* 9.0  MG  --   --   --   --  2.3 1.9   Liver Function Tests: Recent Labs  Lab 07/15/22 2130  AST 32   ALT 38  ALKPHOS 89  BILITOT 0.4  PROT 6.5  ALBUMIN 3.7   CBG: No results for input(s): "GLUCAP" in the last 168 hours.  Discharge time spent: greater than 30 minutes.  Signed: 2131, MD [facility] 07/18/2022

## 2022-07-18 NOTE — TOC Transition Note (Signed)
Transition of Care [facility]) - CM/SW Discharge Note   Patient Details  Name: Margaret Mccann MRN: [id number] Date of Birth: 1971/04/11  Transition of Care [facility]) CM/SW Contact:  Roseanne Kaufman, RN Phone Number: 07/18/2022, 3:25 PM   Clinical Narrative:   Patient has bed at [facility], room# 304-1, pending repeat 15 minutes COVID test. Per Carlis Abbott, report can be called [phone], once report is called this RNCM will call [employer] for IVC transport.  IVC active 07/16/22 for 7 days, IVC will end 07/23/22. This RNCM will fax IVC paperwork to [facility].   No additional TOC needs at this time.     Final next level of care: Psychiatric Hospital Barriers to Discharge: No Barriers Identified   Patient Goals and CMS Choice Patient states their goals for this hospitalization and ongoing recovery are:: inpatient psych   Choice offered to / list presented to : NA  Discharge Placement                Patient to be transferred to facility by: [employer]      Discharge Plan and Services In-house Referral: NA Discharge Planning Services: CM Consult            DME Arranged: N/A DME Agency: NA       HH Arranged: NA HH Agency: NA        Social Determinants of Health (SDOH) Interventions     Readmission Risk Interventions     No data to display

## 2022-07-18 NOTE — TOC Progression Note (Signed)
Transition of Care [facility]) - Progression Note    Patient Details  Name: Margaret Mccann MRN: [id number] Date of Birth: 05-30-1971  Transition of Care [facility]) CM/SW [place], RN Phone Number: 07/18/2022, 10:07 AM  Clinical Narrative:   Patient has been referred to multiple inpatient psych facilities with no current bed offers. [facility] did not have a bed on yesterday. Will fax out to additional inpatient facilities, await bed offers.   TOC will continue to follow.    Expected Discharge Plan: Psychiatric Hospital Barriers to Discharge: Continued Medical Work up  Expected Discharge Plan and Services Expected Discharge Plan: [facility] In-house Referral: NA Discharge Planning Services: CM Consult   Living arrangements for the past 2 months: Single Family Home                 DME Arranged: N/A DME Agency: NA       HH Arranged: NA HH Agency: NA         Social Determinants of Health (SDOH) Interventions    Readmission Risk Interventions     No data to display

## 2022-07-18 NOTE — Progress Notes (Signed)
Pt was accepted to [facility] 07/18/22; Bed Assignment 304-1Pending repeat 15 min covid. Consent or IVC paperwork can be faxed to [phone]  Pt meets inpatient criteria per Sheran Fava, FNP,  Attending Physician will be Dr. Caswell Corwin  Report can be called to: Adult unit: [phone]  Pt can arrive after: PENDING ITEMS  Care Team notified: Elbert Ewings, RN, [facility] Lynnda Shields, RN, Inda Merlin, MD, Sheran Fava, FNP, Kathreen Cornfield, RN  Nadara Mode, LCSWA 07/18/2022 @ 2:45 PM

## 2022-07-18 NOTE — Progress Notes (Signed)
Pt admitted IVC.  Sober for 4 months.  Laid off from job approximately 2 months ago.  Stopped going to [employer].  Became depressed.  Pt's ex-fiance's father past last Saturday which increased depression.  Pt then relapsed on cocaine and ETOH.  Pt was living with mother and stepfather who put her out of the house due to her relapse.  She is now unsure of living arrangements.  Pt's stepfather has a protective order against patient.  Pt's ex-fiance completed suicide last year, her sister completed suicide 18 years ago and a 1st cousin completed suicide years ago.  Pt denies SI, HI, AVH.  Pt admitted to [facility] 2-3 years ago.  Pt oriented to unit.  Pt safe on unit.

## 2022-07-18 NOTE — Progress Notes (Signed)
Adult Psychoeducational Group Note  Date:  07/18/2022 Time:  9:22 PM  Group Topic/Focus:  Wrap-Up Group:   The focus of this group is to help patients review their daily goal of treatment and discuss progress on daily workbooks.  Participation Level:  Active  Participation Quality:  Appropriate  Affect:  Depressed and Tearful  Cognitive:  Appropriate  Insight: Appropriate  Engagement in Group:  Engaged  Modes of Intervention:  Discussion and Support  Additional Comments:   Pt attended and actively participated in the [place] group. Pt denied SI/HI/AVH. Pt rated her day 1/10. "Its my first day here, I'm scared and depressed". Pt was receptive to the reassurance and emotional support provided by Probation officer. Pt stated that she didn't have any goals today. Pt reports using breathing techniques, coloring and positive affirmations to improve coping.  Wetzel Bjornstad Layani Foronda 07/18/2022, 9:22 PM

## 2022-07-19 ENCOUNTER — Encounter (HOSPITAL_COMMUNITY): Payer: Self-pay

## 2022-07-19 DIAGNOSIS — F332 Major depressive disorder, recurrent severe without psychotic features: Secondary | ICD-10-CM

## 2022-07-19 DIAGNOSIS — Z87898 Personal history of other specified conditions: Secondary | ICD-10-CM

## 2022-07-19 DIAGNOSIS — F411 Generalized anxiety disorder: Secondary | ICD-10-CM

## 2022-07-19 DIAGNOSIS — F1090 Alcohol use, unspecified, uncomplicated: Secondary | ICD-10-CM

## 2022-07-19 MED ORDER — DESVENLAFAXINE SUCCINATE ER 50 MG PO TB24
50.0000 mg | ORAL_TABLET | Freq: Every day | ORAL | Status: DC
  Administered 2022-07-19 – 2022-07-20 (×2): 50 mg via ORAL
  Filled 2022-07-19 (×4): qty 1

## 2022-07-19 MED ORDER — HYDROXYZINE HCL 50 MG PO TABS
50.0000 mg | ORAL_TABLET | Freq: Three times a day (TID) | ORAL | Status: DC | PRN
  Administered 2022-07-19 – 2022-07-21 (×6): 50 mg via ORAL
  Filled 2022-07-19 (×6): qty 1

## 2022-07-19 MED ORDER — GABAPENTIN 600 MG PO TABS
600.0000 mg | ORAL_TABLET | Freq: Two times a day (BID) | ORAL | Status: DC
  Administered 2022-07-19: 600 mg via ORAL
  Filled 2022-07-19 (×3): qty 1

## 2022-07-19 MED ORDER — MIRTAZAPINE 15 MG PO TBDP
15.0000 mg | ORAL_TABLET | Freq: Every day | ORAL | Status: DC
  Administered 2022-07-19 – 2022-07-20 (×2): 15 mg via ORAL
  Filled 2022-07-19 (×4): qty 1

## 2022-07-19 MED ORDER — PANTOPRAZOLE SODIUM 40 MG PO TBEC
40.0000 mg | DELAYED_RELEASE_TABLET | Freq: Every day | ORAL | Status: DC
  Administered 2022-07-20 – 2022-07-21 (×2): 40 mg via ORAL
  Filled 2022-07-19 (×4): qty 1

## 2022-07-19 MED ORDER — PANTOPRAZOLE SODIUM 20 MG PO TBEC
20.0000 mg | DELAYED_RELEASE_TABLET | Freq: Every day | ORAL | Status: DC
  Administered 2022-07-19: 20 mg via ORAL
  Filled 2022-07-19 (×4): qty 1

## 2022-07-19 MED ORDER — PROPRANOLOL HCL 10 MG PO TABS
10.0000 mg | ORAL_TABLET | Freq: Every day | ORAL | Status: DC
  Administered 2022-07-19 – 2022-07-21 (×3): 10 mg via ORAL
  Filled 2022-07-19 (×7): qty 1

## 2022-07-19 MED ORDER — PANTOPRAZOLE SODIUM 20 MG PO TBEC
20.0000 mg | DELAYED_RELEASE_TABLET | Freq: Every day | ORAL | Status: AC
  Administered 2022-07-19: 20 mg via ORAL
  Filled 2022-07-19: qty 1

## 2022-07-19 MED ORDER — CLONIDINE HCL 0.1 MG PO TABS
0.1000 mg | ORAL_TABLET | Freq: Once | ORAL | Status: AC
  Administered 2022-07-19: 0.1 mg via ORAL
  Filled 2022-07-19 (×2): qty 1

## 2022-07-19 MED ORDER — CLONIDINE HCL 0.2 MG PO TABS
0.2000 mg | ORAL_TABLET | Freq: Every day | ORAL | Status: DC
  Administered 2022-07-19 – 2022-07-20 (×2): 0.2 mg via ORAL
  Filled 2022-07-19 (×4): qty 1

## 2022-07-19 NOTE — Progress Notes (Signed)
Chaplain met with Margaret Mccann after a referral from her nurse.  Margaret Mccann had just been served papers by her step-father and was tearful and upset.  Chaplain provided listening as well as emotional support as she shared about her relationship with her mother and step father and about her fears of being homeless.  Chaplain also provided prayer, at her request.  [name], [place] Pager, [phone]

## 2022-07-19 NOTE — Progress Notes (Signed)
Pt on unit this morning. Pt hypertensive, improved with clonidine. Pt denies SI/HI/self harm thoughts as well as a/v hallucinations. Pt endorses continued depression, noting this month is the anniversary of the suicide of a past partner. Pt reports recently she had stopped going to [employer] meetings and relapsed on alcohol after approximately 4 months of sobriety. Pt reports that attributed to her intentional overdose/suicide attempt. Pt received hydroxyzine as needed for anxiety. Q 15 minute checks ongoing for safety.

## 2022-07-19 NOTE — Progress Notes (Signed)
Chaplain first met Margaret Mccann at [employer] and followed up with her today on 300 [address].  She reported feeling much better and feeling safe.  She had a good conversation with her Education officer, museum and feels grateful for the SW advocating for her to be able to stay with her parents until she is strong enough to get back on her feet.  She is hopeful that this will be able to happen.   [address], [place] Pager, [phone]

## 2022-07-19 NOTE — BHH Group Notes (Signed)
Pt attended group and participated in discussion. 

## 2022-07-19 NOTE — Progress Notes (Signed)
Chaplain provided follow up care to [facility] who today was visited by her step-father in order to inform her that he would no longer be paying for her storage unit and she would likely lose all of her furniture. She was understandably tearful and was unclear about whether the restraining order also related to contacting her mom by telephone.  Her mother has Bipolar Disorder and she had not been in a good state of mind prior to Nickie's admission.  Chaplain encouraged her to focus on her own health right now, especially since she was not sure who her phone call would be received.  She shared some additional information that are factors in her mental well-being.  Her sister died by suicide 20 years ago and she helped to raise her children who were young at the time.  Erick Blinks was married and has 2 children from that marriage who are currently living with their dad ever since Felton moved back home in 2021 to live with her mom and step-dad following the death by suicide of her fiance who hung himself in their home.  She was the one to find him and attempted to resuscitate him.  After that event, she came to [facility] for support.  She has used alcohol and cocaine on and off since about 8 years after her sister died.  She had been sober for about 4 months until last Saturday when she used cocaine following the death of her former father-in-law, with whom she was very close.  When she returned home the next morning she was told that she was no longer welcome in their home because of her substance use.  Her mom took her dog and locked herself in a room and Nickie went to retrieve the dog.  Her mother was hurt in the process.  Nickie reported feeling hopeless and feeling like she had no options for where to live.  She took pills in an attempt to end her life, but when she started to feel the effect of the pills, she called 911 because she thought of her kids and didn't want them to feel what she had felt  with the death of her sister and fiance.    She is still very scared for her future.  She does have some property that her father left her that is under contract of sale but that hasn't yet gone through.  She knows that she will have some money when that sale finalizes.  She wants to live for her children and get her life back on track starting with finding herself somewhere to live.  Chaplain provided emotional and spiritual support as she shared about her life.  [address], [place] Pager, [phone]

## 2022-07-19 NOTE — BH IP Treatment Plan (Signed)
Interdisciplinary Treatment and Diagnostic Plan Update  07/19/2022 Time of Session: [address] Sierra Spargo MRN: [id number]  Principal Diagnosis: MDD (major depressive disorder), recurrent episode, severe ([place])  Secondary Diagnoses: Principal Problem:   MDD (major depressive disorder), recurrent episode, severe ([place])   Current Medications:  Current Facility-Administered Medications  Medication Dose Route Frequency Provider Last Rate Last Admin   acetaminophen (TYLENOL) tablet 650 mg  650 mg Oral Q6H PRN Starkes-Perry, Gayland Curry, FNP       alum & mag hydroxide-simeth (MAALOX/MYLANTA) 200-200-20 MG/5ML suspension 30 mL  30 mL Oral Q4H PRN Starkes-Perry, Gayland Curry, FNP       gabapentin (NEURONTIN) tablet 1,200 mg  1,200 mg Oral BID Suella Broad, FNP   1,200 mg at 07/19/22 0751   hydrALAZINE (APRESOLINE) tablet 25 mg  25 mg Oral Q8H PRN Suella Broad, FNP   25 mg at 07/18/22 2141   hydrOXYzine (ATARAX) tablet 50 mg  50 mg Oral TID PRN Nicholes Rough, NP       influenza vac split quadrivalent PF (FLUARIX) injection 0.5 mL  0.5 mL Intramuscular Tomorrow-1000 Massengill, Ovid Curd, MD       irbesartan (AVAPRO) tablet 75 mg  75 mg Oral Daily Suella Broad, FNP   75 mg at 07/19/22 0751   LORazepam (ATIVAN) tablet 1 mg  1 mg Oral PRN Suella Broad, FNP       magnesium hydroxide (MILK OF MAGNESIA) suspension 30 mL  30 mL Oral Daily PRN Starkes-Perry, Gayland Curry, FNP       melatonin tablet 3 mg  3 mg Oral QHS Evette Georges, NP       OLANZapine (ZYPREXA) tablet 2.5 mg  2.5 mg Oral QHS PRN Evette Georges, NP       pantoprazole (PROTONIX) EC tablet 20 mg  20 mg Oral Daily Nkwenti, Doris, NP       pneumococcal 20-valent conjugate vaccine (PREVNAR 20) injection 0.5 mL  0.5 mL Intramuscular Tomorrow-1000 Massengill, Nathan, MD       PTA Medications: Medications Prior to Admission  Medication Sig Dispense Refill Last Dose   atorvastatin (LIPITOR) 20 MG tablet Take 1  tablet (20 mg total) by mouth at bedtime. 60 tablet 4    docusate sodium (COLACE) 100 MG capsule Take 1 capsule (100 mg total) by mouth 2 (two) times daily as needed for mild constipation. 10 capsule 0    gabapentin (NEURONTIN) 600 MG tablet Take 2 tablets (1,200 mg total) by mouth 2 (two) times daily. 120 tablet 3    melatonin 3 MG TABS tablet Take 1 tablet (3 mg total) by mouth at bedtime.  0    OLANZapine (ZYPREXA) 2.5 MG tablet Take 1 tablet (2.5 mg total) by mouth at bedtime as needed (insomnia/sleep). 30 tablet 3    pantoprazole (PROTONIX) 20 MG tablet Take 1 tablet (20 mg total) by mouth daily. 60 tablet 4    polyethylene glycol (MIRALAX / GLYCOLAX) 17 g packet Take 17 g by mouth daily as needed for moderate constipation. 14 each 0    valsartan (DIOVAN) 80 MG tablet Take 1 tablet (80 mg total) by mouth daily. 60 tablet 3    venlafaxine XR (EFFEXOR-XR) 75 MG 24 hr capsule Take 1 capsule (75 mg total) by mouth daily with breakfast.       Patient Stressors:    Patient Strengths:    Treatment Modalities: Medication Management, Group therapy, Case management,  1 to 1 session with clinician, Psychoeducation, Recreational therapy.  Physician Treatment Plan for Primary Diagnosis: MDD (major depressive disorder), recurrent episode, severe ([place]) Long Term Goal(s):     Short Term Goals:    Medication Management: Evaluate patient's response, side effects, and tolerance of medication regimen.  Therapeutic Interventions: 1 to 1 sessions, Unit Group sessions and Medication administration.  Evaluation of Outcomes: Progressing  Physician Treatment Plan for Secondary Diagnosis: Principal Problem:   MDD (major depressive disorder), recurrent episode, severe ([place])  Long Term Goal(s):     Short Term Goals:       Medication Management: Evaluate patient's response, side effects, and tolerance of medication regimen.  Therapeutic Interventions: 1 to 1 sessions, Unit Group sessions and  Medication administration.  Evaluation of Outcomes: Progressing   RN Treatment Plan for Primary Diagnosis: MDD (major depressive disorder), recurrent episode, severe ([place]) Long Term Goal(s): Knowledge of disease and therapeutic regimen to maintain health will improve  Short Term Goals: Ability to remain free from injury will improve, Ability to verbalize frustration and anger appropriately will improve, Ability to demonstrate self-control, Ability to participate in decision making will improve, Ability to verbalize feelings will improve, Ability to disclose and discuss suicidal ideas, Ability to identify and develop effective coping behaviors will improve, and Compliance with prescribed medications will improve  Medication Management: RN will administer medications as ordered by provider, will assess and evaluate patient's response and provide education to patient for prescribed medication. RN will report any adverse and/or side effects to prescribing provider.  Therapeutic Interventions: 1 on 1 counseling sessions, Psychoeducation, Medication administration, Evaluate responses to treatment, Monitor vital signs and CBGs as ordered, Perform/monitor CIWA, COWS, AIMS and Fall Risk screenings as ordered, Perform wound care treatments as ordered.  Evaluation of Outcomes: Progressing   LCSW Treatment Plan for Primary Diagnosis: MDD (major depressive disorder), recurrent episode, severe ([place]) Long Term Goal(s): Safe transition to appropriate next level of care at discharge, Engage patient in therapeutic group addressing interpersonal concerns.  Short Term Goals: Engage patient in aftercare planning with referrals and resources, Increase social support, Increase ability to appropriately verbalize feelings, Increase emotional regulation, Facilitate acceptance of mental health diagnosis and concerns, Facilitate patient progression through stages of change regarding substance use diagnoses and concerns,  Identify triggers associated with mental health/substance abuse issues, and Increase skills for wellness and recovery  Therapeutic Interventions: Assess for all discharge needs, 1 to 1 time with Social worker, Explore available resources and support systems, Assess for adequacy in community support network, Educate family and significant other(s) on suicide prevention, Complete Psychosocial Assessment, Interpersonal group therapy.  Evaluation of Outcomes: Progressing   Progress in Treatment: Attending groups: Yes. Participating in groups: Yes. Taking medication as prescribed: Yes. Toleration medication: Yes. Family/Significant other contact made: No, will contact:  CSW will obtain consent to reach family/friends. Patient understands diagnosis: Yes. Discussing patient identified problems/goals with staff: Yes. Medical problems stabilized or resolved: Yes. Denies suicidal/homicidal ideation: No. Issues/concerns per patient self-inventory: Yes. Other: none  New problem(s) identified: No, Describe:  none  New Short Term/Long Term Goal(s): Patient to work towards detox, elimination of symptoms of psychosis, medication management for mood stabilization; elimination of SI thoughts; development of comprehensive mental wellness/sobriety plan.  Patient Goals:  Patient states their goal for treatment is to "to get stabilized and I do not know what my living situation is going to look like."  Discharge Plan or Barriers: Patient is currently experiencing housing instability, CSW to discuss options with patient and provide further case management.   Reason for Continuation  of Hospitalization: Depression Medication stabilization Other; describe active substance use   Estimated Length of Stay: 1-7 days   Scribe for Treatment Team: Larose Kells 07/19/2022 11:17 AM
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
## 2022-07-19 NOTE — Progress Notes (Signed)
The patient attended the evening N.A.meeting and was appropriate.  

## 2022-07-19 NOTE — Progress Notes (Signed)
Adult Psychoeducational Group Note  Date:  07/19/2022 Time:  10:36 AM  Group Topic/Focus:  Orientation:   The focus of this group is to educate the patient on the purpose and policies of crisis stabilization and provide a format to answer questions about their admission.  The group details unit policies and expectations of patients while admitted.  Participation Level:  Active  Participation Quality:  Appropriate  Affect:  Appropriate  Cognitive:  Appropriate  Insight: Appropriate  Engagement in Group:  Engaged  Modes of Intervention:  Discussion  Additional Comments:  Patient attended morning orientation and participated   Margaret Mccann December 07/19/2022, 10:36 AM
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
## 2022-07-19 NOTE — Progress Notes (Signed)
   07/19/22 0103  Psych Admission Type (Psych Patients Only)  Admission Status Voluntary  Psychosocial Assessment  Patient Complaints Anxiety;Insomnia;Irritability;Depression;Crying spells;Restlessness;Sadness;Sleep disturbance;Worrying  Eye Contact Fair  Facial Expression Anxious  Affect Anxious  Speech Logical/coherent  Interaction Attention-seeking  Motor Activity Fidgety  Appearance/Hygiene Unremarkable  Behavior Characteristics Fidgety;Anxious  Mood Anxious;Depressed;Labile  Thought Process  Coherency WDL  Content WDL  Delusions WDL  Perception WDL  Hallucination None reported or observed  Judgment Poor  Confusion WDL  Danger to Self  Current suicidal ideation? Denies  Danger to Others  Danger to Others None reported or observed   Pt attention seeking, constantly at nursing station, focused on Clonidine, states has not been getting since being in ED. Pt tearful at times, states she doesn't sleep, asking for medications, restless, anxious. BP elevated, received order Clonidine x1. Denies SI/HI or hallucinations, observed reading in bed (a) 15 min checks (r) safety maintained.

## 2022-07-19 NOTE — [facility] Suicide Risk Assessment (Signed)
[facility] INPATIENT:  Family/Significant Other Suicide Prevention Education  Suicide Prevention Education:   Refusal to Support Patient after Discharge:  Suicide Prevention Education Not Provided:  Patient has identified home of family/significant other as the place the patient will be residing a  With written consent of the patient, two attempts were made to provide Suicide Prevention Education to Margaret Mccann [phone] (Mother)  This person indicates hshe will not be responsible for the patient after discharge.  [place] MSW, LCSW 07/19/2022,4:07 PM

## 2022-07-19 NOTE — Group Note (Signed)
LCSW Group Therapy Note  Group Date: 07/19/2022 Start Time: 1300 End Time: 1400   Type of Therapy and Topic:  Group Therapy: Using "I" Statements  Participation Level:  Active  Description of Group:  Patients were asked to provide details of some interpersonal conflicts they have experienced. Patients were then educated about "I" statements, communication which focuses on feelings or views of the speaker rather than what the other person is doing. T group members were asked to reflect on past conflicts and to provide specific examples for utilizing "I" statements.  Therapeutic Goals:  Patients will verbalize understanding of ineffective communication and effective communication. Patients will be able to empathize with whom they are having conflict. Patients will practice effective communication in the form of "I" statements.    Summary of Patient Progress:  Erick Blinks shared her experience. The patient was present/active throughout the session and proved open to feedback from [place] and peers. Patient demonstrated appropriate insight into the subject matter, was respectful of peers, and was present throughout the entire session.  Therapeutic Modalities:   Cognitive Behavioral Therapy Solution-Focused Therapy    Windle Guard, LCSW 07/19/2022  3:44 PM

## 2022-07-20 DIAGNOSIS — F332 Major depressive disorder, recurrent severe without psychotic features: Secondary | ICD-10-CM | POA: Diagnosis not present

## 2022-07-20 LAB — TSH: TSH: 3.544 u[IU]/mL (ref 0.350–4.500)

## 2022-07-20 LAB — LIPID PANEL
Cholesterol: 199 mg/dL (ref 0–200)
HDL: 58 mg/dL (ref >40)
LDL Cholesterol: 118 mg/dL — ABNORMAL HIGH (ref 0–99)
Total CHOL/HDL Ratio: 3.4 RATIO
Triglycerides: 117 mg/dL (ref <150)
VLDL: 23 mg/dL (ref 0–40)

## 2022-07-20 LAB — VITAMIN D 25 HYDROXY (VIT D DEFICIENCY, FRACTURES): Vit D, 25-Hydroxy: 21.2 ng/mL — ABNORMAL LOW (ref 30–100)

## 2022-07-20 LAB — VITAMIN B12: Vitamin B-12: 916 pg/mL — ABNORMAL HIGH (ref 180–914)

## 2022-07-20 LAB — HEMOGLOBIN A1C
Hgb A1c MFr Bld: 5.7 % — ABNORMAL HIGH (ref 4.8–5.6)
Mean Plasma Glucose: 116.89 mg/dL

## 2022-07-20 MED ORDER — IBUPROFEN 400 MG PO TABS
400.0000 mg | ORAL_TABLET | Freq: Four times a day (QID) | ORAL | Status: DC | PRN
  Administered 2022-07-20: 400 mg via ORAL
  Filled 2022-07-20: qty 1

## 2022-07-20 MED ORDER — GABAPENTIN 600 MG PO TABS
600.0000 mg | ORAL_TABLET | Freq: Two times a day (BID) | ORAL | Status: DC
  Administered 2022-07-20 – 2022-07-21 (×3): 600 mg via ORAL
  Filled 2022-07-20 (×6): qty 1

## 2022-07-20 MED ORDER — OLANZAPINE 5 MG PO TABS: 5.0000 mg | ORAL_TABLET | Freq: Every evening | ORAL | Status: DC | PRN

## 2022-07-20 MED ORDER — OLANZAPINE 5 MG PO TABS
5.0000 mg | ORAL_TABLET | Freq: Every day | ORAL | Status: DC
  Administered 2022-07-20: 5 mg via ORAL
  Filled 2022-07-20 (×3): qty 1

## 2022-07-20 MED ORDER — DESVENLAFAXINE SUCCINATE ER 50 MG PO TB24
75.0000 mg | ORAL_TABLET | Freq: Every day | ORAL | Status: DC
  Administered 2022-07-21: 75 mg via ORAL
  Filled 2022-07-20 (×3): qty 1

## 2022-07-20 NOTE — Progress Notes (Signed)
Adult Psychoeducational Group Note  Date:  07/20/2022 Time:  9:19 PM  Group Topic/Focus:  Wrap-Up Group:   The focus of this group is to help patients review their daily goal of treatment and discuss progress on daily workbooks.  Participation Level:  Active  Participation Quality:  Appropriate  Affect:  Appropriate  Cognitive:  Appropriate  Insight: Appropriate  Engagement in Group:  Engaged  Modes of Intervention:  Education and Exploration  Additional Comments:  Patient attended and participated  group tonight. She reports that she learn she should try staying in the moment.  Salley Scarlet [facility] 07/20/2022, 9:19 PM
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
## 2022-07-20 NOTE — Progress Notes (Signed)
Patient stated she took tylenol early this afternoon and also used ice for pain on her right shoulder pain #7.  No relief. Later this afternoon patient took advil for R shoulder pain #8.  Patient stated tylenol did not help her at all.  Nurse explained to patient that  she has neurotin at 2000 for pain.  Explained to patient that MD was trying to help her pain with neurotin.  Patient said "thank you".

## 2022-07-20 NOTE — Progress Notes (Signed)
Pt on unit, attended group. Pt medication compliant, utilized prn hydroxyzine as needed for anxiety. Pt reports improved sleep last night. Denied suicidal thoughts. Q 15 minute checks ongoing.

## 2022-07-20 NOTE — Group Note (Signed)
Date:  07/20/2022 Time:  10:27 AM  Group Topic/Focus:  Orientation:   The focus of this group is to educate the patient on the purpose and policies of crisis stabilization and provide a format to answer questions about their admission.  The group details unit policies and expectations of patients while admitted.    Participation Level:  Active  Participation Quality:  Appropriate  Affect:  Appropriate  Cognitive:  Appropriate  Insight: Appropriate  Engagement in Group:  Engaged  Modes of Intervention:  Discussion  Additional Comments:     Jerrye Beavers 07/20/2022, 10:27 AM

## 2022-07-20 NOTE — Progress Notes (Signed)
[facility] MD Progress Note  07/20/2022 9:37 AM Margaret Mccann Margaret Mccann  MRN:  [id number] Subjective:    HPI:  Margaret Mccann is a 51 year old Caucasian female with prior mental health diagnosis of MDD, alcohol use disorder, cocaine use disorder, GAD, and PTSD who presented to [facility] 07/15/2022 after intentional overdose on Clonidine due to socioeconomic stressors and dysfunctional grieving. Patient was voluntarily transferred to [facility] 07/18/22 for treatment and stabilization of her mood. Recently to receive outpatient services at [facility].   24 hour assessment:  Patient noted to be actively visible in milieu and participating in unit group activities. Received PRN Hydroxyzine x 5 for anxiety, Acetaminophen 650 mg x1 1332 for mild pain, . No notes regarding insomnia or inappropriate behavior.   Assessment:  Patient reports feeling "good after getting medications straight yesterday"; endorses improved BP. Denies any headaches, tremor. Endorses sleeping "better" and current mood as "good". Rates depression 4/10, anxiety 7/10. Reports ongoing anxiety regarding recent relapse and being kicked out of her parents home, unsure of ability to return. Expresses interests in residential treatment programs, particularly one geared for women. She reports attending groups and completing homework assigned. She denies any SI/HI/AVH. Appears calm and cooperative; does not appear to be responding to any external/internal stimuli.   Principal Problem: MDD (major depressive disorder), recurrent severe, without psychosis (HCC) Diagnosis: Principal Problem:   MDD (major depressive disorder), recurrent severe, without psychosis (HCC) Active Problems:   Cocaine use disorder (HCC)   PTSD (post-traumatic stress disorder)   GAD (generalized anxiety disorder)   Alcohol use disorder   Essential hypertension   Tobacco use  Total Time spent with patient: 30 minutes  Past Psychiatric History: MDD, alcohol use disorder,  cocaine use disorder, GAD, PTSD  Past Medical History:  Past Medical History:  Diagnosis Date   Alcohol withdrawal seizure with complication, with unspecified complication (HCC) 12/23/2020   Anxiety    Bipolar 1 disorder (HCC)    Depression    Intentional drug overdose (HCC) 08/04/2020   Major depressive disorder, recurrent episode with anxious distress (HCC) 08/11/2020   Morgellons syndrome    MRSA (methicillin resistant Staphylococcus aureus)    Suicide attempt (HCC)    Suicide by drug overdose (HCC) 08/11/2020   Tricuspid valve regurgitation 12/18/2020    Past Surgical History:  Procedure Laterality Date   APPENDECTOMY     Family History:  Family History  Problem Relation Age of Onset   Heart attack Father 40   Stroke Father    Severe combined immunodeficiency Sister    Breast cancer Paternal Aunt    Ulcerative colitis Neg Hx    Esophageal cancer Neg Hx    Family Psychiatric History: not noted Social History:  Social History   Substance and Sexual Activity  Alcohol Use Yes   Comment: occas     Social History   Substance and Sexual Activity  Drug Use Not Currently   Types: Cocaine   Comment: relapsed the past weekend, no cocaine use prior 6 months    Social History   Socioeconomic History   Marital status: Single    Spouse name: Not on file   Number of children: Not on file   Years of education: Not on file   Highest education level: Not on file  Occupational History   Not on file  Tobacco Use   Smoking status: Some Days    Packs/day: 0.25    Years: 5.00    Total pack years: 1.25  Types: Cigarettes   Smokeless tobacco: Never  Vaping Use   Vaping Use: Never used  Substance and Sexual Activity   Alcohol use: Yes    Comment: occas   Drug use: Not Currently    Types: Cocaine    Comment: relapsed the past weekend, no cocaine use prior 6 months   Sexual activity: Not Currently    Birth control/protection: Pill  Other Topics Concern   Not on file   Social History Narrative   ** Merged History Encounter **       Social Determinants of Health   Financial Resource Strain: Not on file  Food Insecurity: No Food Insecurity (07/18/2022)   Hunger Vital Sign    Worried About Running Out of Food in the Last Year: Never true    Ran Out of Food in the Last Year: Never true  Transportation Needs: No Transportation Needs (07/18/2022)   PRAPARE - Administrator, Civil ServiceTransportation    Lack of Transportation (Medical): No    Lack of Transportation (Non-Medical): No  Physical Activity: Not on file  Stress: Not on file  Social Connections: Not on file   Additional Social History:   Sleep: Good  Appetite:  Good  Current Medications: Current Facility-Administered Medications  Medication Dose Route Frequency Provider Last Rate Last Admin   acetaminophen (TYLENOL) tablet 650 mg  650 mg Oral Q6H PRN Starkes-Perry, Juel Burrowakia S, FNP       alum & mag hydroxide-simeth (MAALOX/MYLANTA) 200-200-20 MG/5ML suspension 30 mL  30 mL Oral Q4H PRN Starkes-Perry, Juel Burrowakia S, FNP       cloNIDine (CATAPRES) tablet 0.2 mg  0.2 mg Oral QHS Massengill, Nathan, MD   0.2 mg at 07/19/22 2139   desvenlafaxine (PRISTIQ) 24 hr tablet 50 mg  50 mg Oral Daily Massengill, Harrold DonathNathan, MD   50 mg at 07/20/22 0736   gabapentin (NEURONTIN) tablet 600 mg  600 mg Oral BID Massengill, Harrold DonathNathan, MD   600 mg at 07/19/22 2133   hydrOXYzine (ATARAX) tablet 50 mg  50 mg Oral TID PRN Starleen BlueNkwenti, Doris, NP   50 mg at 07/20/22 0736   irbesartan (AVAPRO) tablet 75 mg  75 mg Oral Daily Maryagnes AmosStarkes-Perry, Takia S, FNP   75 mg at 07/20/22 0736   LORazepam (ATIVAN) tablet 1 mg  1 mg Oral PRN Maryagnes AmosStarkes-Perry, Takia S, FNP       magnesium hydroxide (MILK OF MAGNESIA) suspension 30 mL  30 mL Oral Daily PRN Starkes-Perry, Juel Burrowakia S, FNP       melatonin tablet 3 mg  3 mg Oral QHS Sindy GuadeloupeWilliams, Roy, NP   3 mg at 07/19/22 2134   mirtazapine (REMERON SOL-TAB) disintegrating tablet 15 mg  15 mg Oral QHS Massengill, Harrold DonathNathan, MD   15 mg at 07/19/22  2134   OLANZapine (ZYPREXA) tablet 2.5 mg  2.5 mg Oral QHS PRN Sindy GuadeloupeWilliams, Roy, NP   2.5 mg at 07/19/22 2137   pantoprazole (PROTONIX) EC tablet 40 mg  40 mg Oral Daily Massengill, Harrold DonathNathan, MD   40 mg at 07/20/22 0825   propranolol (INDERAL) tablet 10 mg  10 mg Oral Daily Massengill, Harrold DonathNathan, MD   10 mg at 07/20/22 [id number]    Lab Results:  Results for orders placed or performed during the hospital encounter of 07/18/22 (from the past 48 hour(s))  Lipid panel     Status: Abnormal   Collection Time: 07/20/22  7:06 AM  Result Value Ref Range   Cholesterol 199 0 - 200 mg/dL   Triglycerides 841117 <324<150 mg/dL  HDL 58 >40 mg/dL   Total CHOL/HDL Ratio 3.4 RATIO   VLDL 23 0 - 40 mg/dL   LDL Cholesterol 118 (H) 0 - 99 mg/dL    Comment:        Total Cholesterol/HDL:CHD Risk Coronary Heart Disease Risk Table                     Men   Women  1/2 Average Risk   3.4   3.3  Average Risk       5.0   4.4  2 X Average Risk   9.6   7.1  3 X Average Risk  23.4   11.0        Use the calculated Patient Ratio above and the CHD Risk Table to determine the patient's CHD Risk.        ATP III CLASSIFICATION (LDL):  <100     mg/dL   Optimal  100-129  mg/dL   Near or Above                    Optimal  130-159  mg/dL   Borderline  160-189  mg/dL   High  >190     mg/dL   Very High Performed at [facility] [address] [place], [place] [address]   TSH     Status: None   Collection Time: 07/20/22  7:07 AM  Result Value Ref Range   TSH 3.544 0.350 - 4.500 uIU/mL    Comment: Performed by a 3rd Generation assay with a functional sensitivity of <=0.01 uIU/mL. Performed at [facility], [place] [address] [place], [place] [address]   Vitamin B12     Status: Abnormal   Collection Time: 07/20/22  7:07 AM  Result Value Ref Range   Vitamin B-12 916 (H) 180 - 914 pg/mL    Comment: (NOTE) This assay is not validated for testing neonatal or myeloproliferative syndrome specimens  for Vitamin B12 levels. Performed at [facility], [place] [address], [place], [place] [address]     Blood Alcohol level:  Lab Results  Component Value Date   ETH 116 (H) 07/15/2022   ETH <10 62/22/9798    Metabolic Disorder Labs: Lab Results  Component Value Date   HGBA1C 6.1 (H) 12/19/2021   MPG 114.02 12/19/2020   No results found for: "PROLACTIN" Lab Results  Component Value Date   CHOL 199 07/20/2022   TRIG 117 07/20/2022   HDL 58 07/20/2022   CHOLHDL 3.4 07/20/2022   VLDL 23 07/20/2022   LDLCALC 118 (H) 07/20/2022   LDLCALC 69 05/16/2021    Physical Findings: AIMS:  , ,  ,  ,    CIWA:    COWS:     Musculoskeletal: Strength & Muscle Tone: within normal limits Gait & Station: normal Patient leans: N/A  Psychiatric Specialty Exam:  Presentation  General Appearance:  Appropriate for Environment; Fairly Groomed  Eye Contact: Good  Speech: Clear and Coherent  Speech Volume: Normal  Handedness: Right   Mood and Affect  Mood: Depressed  Affect: Congruent   Thought Process  Thought Processes: Coherent  Descriptions of Associations:Intact  Orientation:Full (Time, Place and Person)  Thought Content:Logical  History of Schizophrenia/Schizoaffective disorder:No data recorded Duration of Psychotic Symptoms:No data recorded Hallucinations:Hallucinations: None  Ideas of Reference:None  Suicidal Thoughts:Suicidal Thoughts: No  Homicidal Thoughts:Homicidal Thoughts: No   Sensorium  Memory: Immediate Good  Judgment: Fair  Insight: Fair  Executive Functions  Concentration: Fair  Attention Span: Good  Recall: [place]ley [place] of Knowledge: Good  Language: Good   Psychomotor Activity  Psychomotor Activity: Psychomotor Activity: Normal   Assets  Assets: Communication Skills; Resilience   Sleep  Sleep: Sleep: Poor    Physical Exam: Physical Exam Vitals and nursing note reviewed.   Constitutional:      General: She is not in acute distress.    Appearance: She is normal weight. She is not ill-appearing.  HENT:     Head: Normocephalic.     Nose: Nose normal.     Mouth/Throat:     Mouth: Mucous membranes are moist.     Pharynx: Oropharynx is clear.  Eyes:     Pupils: Pupils are equal, round, and reactive to light.  Cardiovascular:     Rate and Rhythm: Normal rate.     Pulses: Normal pulses.  Pulmonary:     Effort: Pulmonary effort is normal.  Abdominal:     Palpations: Abdomen is soft.  Musculoskeletal:        General: Normal range of motion.     Cervical back: Normal range of motion.  Skin:    General: Skin is warm and dry.  Neurological:     Mental Status: She is alert and oriented to person, place, and time.  Psychiatric:        Attention and Perception: She does not perceive auditory or visual hallucinations.        Mood and Affect: Affect is blunt.        Speech: Speech normal.        Behavior: Behavior is cooperative.        Thought Content: Thought content is not paranoid or delusional. Thought content does not include homicidal or suicidal ideation. Thought content does not include homicidal or suicidal plan.        Cognition and Memory: Cognition and memory normal.        Judgment: Judgment normal.    Review of Systems  Psychiatric/Behavioral:  Positive for depression and substance abuse.   All other systems reviewed and are negative.  Blood pressure (!) 129/97, pulse 80, temperature 98.4 F (36.9 C), temperature source Oral, resp. rate 16, height 5\' 3"  (1.6 m), weight 77.1 kg, last menstrual period 10/27/2019, SpO2 100 %. Body mass index is 30.11 kg/m.   Treatment Plan Summary: Daily contact with patient to assess and evaluate symptoms and progress in treatment, Medication management, and Plan :    Safety and Monitoring: Voluntary admission to inpatient psychiatric unit for safety, stabilization and treatment Daily contact with patient  to assess and evaluate symptoms and progress in treatment Patient's case to be discussed in multi-disciplinary team meeting Observation Level : q15 minute checks Vital signs: q12 hours Precautions: Safety    Long Term Goal(s): Improvement in symptoms so as ready for discharge   Short Term Goals: Ability to identify changes in lifestyle to reduce recurrence of condition will improve, Ability to verbalize feelings will improve, Ability to disclose and discuss suicidal ideas, Ability to identify and develop effective coping behaviors will improve, Compliance with prescribed medications will improve, and Ability to identify triggers associated with substance abuse/mental health issues will improve   Diagnoses:  Principal Problem:   MDD (major depressive disorder), recurrent severe, without psychosis (HCC) Active Problems:   Cocaine use disorder (HCC)   PTSD (post-traumatic stress disorder)   Essential hypertension   Tobacco use   GAD (generalized anxiety disorder)   Alcohol  use disorder   MDD (major depressive disorder), recurrent severe, without psychosis (HCC):  Increase:  - Pristiq 75 mg once daily: depressive symptoms  Continue:  - Remeron 15 mg qhs for sleep  PTSD:  - Clonidine 0.2 mg qhs for elevated BP - propranolol 10 mg qam for anxiety  - Gabapentin to 600 mg q12 hrs anxiety.   Cholesteral:  - Atorvastatin at bedtime for hypercholesteremia   Other PRNS -Continue Tylenol 650 mg every 6 hours PRN for mild pain -Continue Maalox 30 mg every 4 hrs PRN for indigestion -Continue Milk of Magnesia as needed every 6 hrs for constipation   Discharge Planning: Social work and case management to assist with discharge planning and identification of hospital follow-up needs prior to discharge Estimated LOS: 5-7 days Discharge Concerns: Need to establish a safety plan; Medication compliance and effectiveness Discharge Goals: Return home with outpatient referrals for mental health  follow-up including medication management/psychotherapy   I certify that inpatient services furnished can reasonably be expected to improve the patient's condition.    Loletta Parish, NP 07/20/2022, 9:37 AM

## 2022-07-21 DIAGNOSIS — F332 Major depressive disorder, recurrent severe without psychotic features: Secondary | ICD-10-CM | POA: Diagnosis not present

## 2022-07-21 MED ORDER — MIRTAZAPINE 15 MG PO TBDP: 15.0000 mg | ORAL_TABLET | Freq: Every day | ORAL | 0 refills | Status: DC

## 2022-07-21 MED ORDER — CLONIDINE HCL 0.2 MG PO TABS: 0.2000 mg | ORAL_TABLET | Freq: Every day | ORAL | 0 refills | Status: DC

## 2022-07-21 MED ORDER — HYDROXYZINE HCL 50 MG PO TABS: 50.0000 mg | ORAL_TABLET | Freq: Three times a day (TID) | ORAL | 0 refills | Status: DC | PRN

## 2022-07-21 MED ORDER — DESVENLAFAXINE SUCCINATE ER 25 MG PO TB24
75.0000 mg | ORAL_TABLET | Freq: Every day | ORAL | Status: DC
  Filled 2022-07-21 (×2): qty 21

## 2022-07-21 MED ORDER — PANTOPRAZOLE SODIUM 20 MG PO TBEC
20.0000 mg | DELAYED_RELEASE_TABLET | Freq: Every day | ORAL | Status: DC
  Filled 2022-07-21: qty 7

## 2022-07-21 MED ORDER — PROPRANOLOL HCL 10 MG PO TABS: 10.0000 mg | ORAL_TABLET | Freq: Every day | ORAL | 0 refills | Status: DC

## 2022-07-21 MED ORDER — OLANZAPINE 2.5 MG PO TABS
2.5000 mg | ORAL_TABLET | Freq: Every day | ORAL | Status: DC
  Filled 2022-07-21: qty 7

## 2022-07-21 MED ORDER — ATORVASTATIN CALCIUM 20 MG PO TABS: 20.0000 mg | ORAL_TABLET | Freq: Every day | ORAL | Status: DC

## 2022-07-21 MED ORDER — GABAPENTIN 600 MG PO TABS: 600.0000 mg | ORAL_TABLET | Freq: Two times a day (BID) | ORAL | Status: DC

## 2022-07-21 MED ORDER — DESVENLAFAXINE SUCCINATE ER 25 MG PO TB24: 75.0000 mg | ORAL_TABLET | Freq: Every day | ORAL | 0 refills | Status: DC

## 2022-07-21 NOTE — BHH Counselor (Signed)
CSW met with pt to discuss safety/discharge planning, pt reports that her stepfather did not visit with her last night, as she had anticipated and states that she does not believe that she will be permitted to stay at the home. Discussed other "safe" options to include homeless shelters to which, patient stated "I am not staying at a shelter" pt states that she will ask her mother to pay for a hotel. Will continue to monitor and provide support as needed.

## 2022-07-21 NOTE — Progress Notes (Signed)
   07/20/22 2200  Psych Admission Type (Psych Patients Only)  Admission Status Voluntary  Psychosocial Assessment  Patient Complaints Anxiety  Eye Contact Fair  Facial Expression Animated  Affect Anxious  Speech Logical/coherent  Interaction Assertive  Motor Activity Slow  Appearance/Hygiene Unremarkable  Behavior Characteristics Cooperative;Appropriate to situation  Mood Anxious  Thought Process  Coherency WDL  Content WDL  Delusions None reported or observed  Perception WDL  Hallucination None reported or observed  Judgment Impaired  Confusion None  Danger to Self  Current suicidal ideation? Denies  Danger to Others  Danger to Others None reported or observed

## 2022-07-21 NOTE — Discharge Instructions (Signed)
-  Follow-up with your outpatient psychiatric provider -instructions on appointment date, time, and address (location) are provided to you in discharge paperwork.  -Take your psychiatric medications as prescribed at discharge - instructions are provided to you in the discharge paperwork  -Follow-up with outpatient primary care doctor and other specialists -for management of preventative medicine and any chronic medical disease.  -Recommend abstinence from alcohol, tobacco, and other illicit drug use at discharge.   -If your psychiatric symptoms recur, worsen, or if you have side effects to your psychiatric medications, call your outpatient psychiatric provider, 911, [phone] or go to the nearest emergency department.  -If suicidal thoughts recur, call your outpatient psychiatric provider, 911, [phone] or go to the nearest emergency department.  

## 2022-07-21 NOTE — Progress Notes (Signed)
  [facility] Adult Case Management Discharge Plan :  Will you be returning to the same living situation after discharge:  Yes,  Margaret Mccann [phone] (Mother) At discharge, do you have transportation home?: Yes,  Parents Do you have the ability to pay for your medications: No.  Release of information consent forms completed and in the chart;  Patient's signature needed at discharge.  Patient to Follow up at:  [facility] Follow up on 09/15/2022.   Specialty: Behavioral Health Why: You have an appointment on 09/15/22 at 11:00 am for therapy services.  * Please call for an interim therapy appointment with this provider as we were unable to contact prior to discharge. Contact information: [facility] [place]        Elsie Stain, MD. Schedule an appointment as soon as possible for a visit.   Specialty: Pulmonary Disease Why: Please call to schedule an appointment with your provider for primary care services. Contact information: [address] [place] [address] [phone]         [place] Follow up on 08/04/2022.   Specialty: Internal Medicine Why: You have an appointment with Vena Rua, FNP for medication management services on 08/04/22 at 3:20 pm. Contact information: [place] [phone]                Next level of care provider has access to [place] and Suicide Prevention discussed: [name]  Margaret Mccann [phone] (Mother)     Has patient been referred to the Quitline?: Patient refused referral  Patient has been referred for addiction treatment: Pt. refused referral Patient to continue working towards treatment goals after discharge. Patient no longer meets criteria for inpatient criteria per attending physician. Continue taking medications as  prescribed, nursing to provide instructions at discharge. Follow up with all scheduled appointments.    [place], LCSW 07/21/2022, 2:08 PM

## 2022-07-21 NOTE — Progress Notes (Signed)
Discharge note: RN met with pt and reviewed pt's discharge instructions. Pt verbalized understanding of discharge instructions and pt did not have any questions. RN reviewed and provided pt with a copy of Suicide Safety Plan, SRA, AVS and Transition Record. Paper prescriptions and medication samples were given to pt. Pt denied SI/HI/AVH and voiced no concerns. Pt was appreciative of the care pt received at [facility]. Patient discharged to lobby without incident.

## 2022-07-21 NOTE — Group Note (Signed)
[facility] LCSW Group Therapy Note   Group Date: 07/21/2022 Start Time: 1300 End Time: 1400   Type of Therapy/Topic:  Group Therapy:  Emotion Regulation  Participation Level:  Active   Mood:  Description of Group:    The purpose of this group is to assist patients in learning to regulate negative emotions and experience positive emotions. Patients will be guided to discuss ways in which they have been vulnerable to their negative emotions. These vulnerabilities will be juxtaposed with experiences of positive emotions or situations, and patients challenged to use positive emotions to combat negative ones. Special emphasis will be placed on coping with negative emotions in conflict situations, and patients will process healthy conflict resolution skills.  Therapeutic Goals: Patient will identify two positive emotions or experiences to reflect on in order to balance out negative emotions:  Patient will label two or more emotions that they find the most difficult to experience:  Patient will be able to demonstrate positive conflict resolution skills through discussion or role plays:   Summary of Patient Progress:       Therapeutic Modalities:   Cognitive Behavioral Therapy Feelings Identification Dialectical Behavioral Therapy   Margaret Mccann S Alaze Garverick, LCSW

## 2022-07-21 NOTE — Group Note (Signed)
Date:  07/21/2022 Time:  2:53 PM  Group Topic/Focus:  Orientation:   The focus of this group is to educate the patient on the purpose and policies of crisis stabilization and provide a format to answer questions about their admission.  The group details unit policies and expectations of patients while admitted.    Participation Level:  Active  Participation Quality:  Appropriate  Affect:  Appropriate  Cognitive:  Appropriate  Insight: Appropriate  Engagement in Group:  Engaged  Modes of Intervention:  Discussion  Additional Comments:     Jerrye Beavers 07/21/2022, 2:53 PM

## 2022-07-21 NOTE — Progress Notes (Signed)
The patient states that she is not ready for discharge today and would like to get discharged tomorrow.
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
## 2022-07-24 ENCOUNTER — Other Ambulatory Visit: Payer: Self-pay

## 2022-07-24 ENCOUNTER — Telehealth (HOSPITAL_COMMUNITY): Payer: Self-pay | Admitting: *Deleted

## 2022-07-24 ENCOUNTER — Other Ambulatory Visit (HOSPITAL_COMMUNITY): Payer: Self-pay | Admitting: Psychiatry

## 2022-07-24 MED ORDER — HYDROXYZINE HCL 50 MG PO TABS
50.0000 mg | ORAL_TABLET | Freq: Three times a day (TID) | ORAL | 3 refills | Status: DC | PRN
  Filled 2022-07-24: qty 90, 30d supply, fill #0

## 2022-07-24 MED ORDER — PROPRANOLOL HCL 10 MG PO TABS
10.0000 mg | ORAL_TABLET | Freq: Every day | ORAL | 3 refills | Status: DC
  Filled 2022-07-24: qty 30, 30d supply, fill #0

## 2022-07-24 MED ORDER — GABAPENTIN 600 MG PO TABS
600.0000 mg | ORAL_TABLET | Freq: Two times a day (BID) | ORAL | 3 refills | Status: DC
  Filled 2022-07-24: qty 60, 30d supply, fill #0

## 2022-07-24 MED ORDER — DESVENLAFAXINE SUCCINATE ER 25 MG PO TB24
75.0000 mg | ORAL_TABLET | Freq: Every day | ORAL | 3 refills | Status: DC
  Filled 2022-07-24: qty 90, 30d supply, fill #0

## 2022-07-24 MED ORDER — GABAPENTIN 600 MG PO TABS
600.0000 mg | ORAL_TABLET | Freq: Two times a day (BID) | ORAL | 3 refills | Status: DC
  Filled 2022-07-24: qty 603, 302d supply, fill #0

## 2022-07-24 MED ORDER — MIRTAZAPINE 15 MG PO TBDP
15.0000 mg | ORAL_TABLET | Freq: Every day | ORAL | 3 refills | Status: DC
  Filled 2022-07-24: qty 30, 30d supply, fill #0

## 2022-07-24 NOTE — Telephone Encounter (Signed)
Medication refilled and sent to preferred pharmacy.  Provider attempted to call patient however received voicemail.  Voicemail left informing patient of refilled medication and follow-up appointment with provider on July 31, 2022.

## 2022-07-24 NOTE — Telephone Encounter (Signed)
Patient called  to speak with Dr B. Parsons stated that she was recently released from [facility] -- And that  her medication's need refills  She also stated that APPT scheduled on 09/15/22 with Dr Bahraini has been canceled & she doesn't know why because she needs to see the provider Patient Requested a Call Back 

## 2022-07-24 NOTE — Telephone Encounter (Signed)
Patient called  to speak with Dr Zigmund Gottron stated that she was recently released from [facility] -- And that  her medication's need refills  She also stated that APPT scheduled on 09/15/22 with Dr Sande Rives has been canceled & she doesn't know why because she needs to see the provider Patient Requested a Call Back

## 2022-07-25 ENCOUNTER — Other Ambulatory Visit: Payer: Self-pay

## 2022-07-27 ENCOUNTER — Other Ambulatory Visit: Payer: Self-pay

## 2022-07-28 ENCOUNTER — Ambulatory Visit: Payer: Self-pay

## 2022-07-28 ENCOUNTER — Other Ambulatory Visit: Payer: Self-pay | Admitting: Critical Care Medicine

## 2022-07-28 DIAGNOSIS — F3341 Major depressive disorder, recurrent, in partial remission: Secondary | ICD-10-CM

## 2022-07-28 NOTE — Telephone Encounter (Signed)
MyChart apt schedule for Monday 11/6/203 at 0810

## 2022-07-28 NOTE — Telephone Encounter (Signed)
Medication Refill - Medication: cloNIDine (CATAPRES) 0.2 MG tablet, OLANZapine (ZYPREXA) 2.5 MG tablet   Has the patient contacted their pharmacy? No. (Agent: If no, request that the patient contact the pharmacy for the refill. If patient does not wish to contact the pharmacy document the reason why and proceed with request.)   Preferred Pharmacy (with phone number or street name):  [facility] [address], [place] [address]  Phone: [phone] Fax: [phone]  Hours: M-F 7:30a-6:00p   Has the patient been seen for an appointment in the last year OR does the patient have an upcoming appointment? Yes.    Agent: Please be advised that RX refills may take up to 3 business days. We ask that you follow-up with your pharmacy.

## 2022-07-28 NOTE — Telephone Encounter (Signed)
  Chief Complaint: UTI s/s Symptoms: Frequency, burning, pressure Frequency: 2-3 days Pertinent Negatives: Patient denies fever Disposition: [] ED /[] Urgent Care (no appt availability in office) / [] Appointment(In office/virtual)/ []  [facility]/ [] Home Care/ [x] Refused Recommended Disposition /[] [facility]/ []  Follow-up with PCP Additional Notes: PT called with frequency, urgency and burning. No appt in office. Pt states she cannot afford UC and does not want to go to ED. Pt is hoping that she can come in for a UA and treatment. Please advise.     Summary: Possible UTI pressure, burning, and frequency   Pt stated she has a possible UTI having a lot of pressure, burning, and frequency pt stated this started a few days ago denied abdominal pain. Pt is requesting Rx be sent.  No appointments available.     Reason for Disposition  Urinating more frequently than usual (i.e., frequency)  Answer Assessment - Initial Assessment Questions 1. SYMPTOM: "What's the main symptom you're concerned about?" (e.g., frequency, incontinence)     Frequency 2. ONSET: "When did the  s/s  start?"     A couple of days ago 3. PAIN: "Is there any pain?" If Yes, ask: "How bad is it?" (Scale: 1-10; mild, moderate, severe)     pressure 4. CAUSE: "What do you think is causing the symptoms?"     UTI 5. OTHER SYMPTOMS: "Do you have any other symptoms?" (e.g., blood in urine, fever, flank pain, pain with urination)     Burning, frequency, pressure 6. PREGNANCY: "Is there any chance you are pregnant?" "When was your last menstrual period?"  Protocols used: Urinary Symptoms-A-AH

## 2022-07-28 NOTE — Telephone Encounter (Signed)
Requested medication (s) are due for refill today: yes   Requested medication (s) are on the active medication list: yes   Last refill:  catapress- 07/21/22-08/04/22 #14 0 refills, zyprexa- 06/15/22 #30 3 refills  Future visit scheduled: no   Notes to clinic:  not delegated per protocol. Zyprexa last ordered by Zigmund Gottron, NP, and catapress last ordered by Janine Limbo, MD. Do you want to refill Rxs?     Requested Prescriptions  Pending Prescriptions Disp Refills   cloNIDine (CATAPRES) 0.2 MG tablet 14 tablet 0    Sig: Take 1 tablet (0.2 mg total) by mouth at bedtime for 14 days.     Cardiovascular:  Alpha-2 Agonists Failed - 07/28/2022  9:26 AM      Failed - Last BP in normal range    BP Readings from Last 1 Encounters:  04/24/22 (!) 137/95         Passed - Last Heart Rate in normal range    Pulse Readings from Last 1 Encounters:  04/24/22 72         Passed - Valid encounter within last 6 months    Recent Outpatient Visits           3 months ago Prediabetes   [place] Elsie Stain, MD   6 months ago Pap smear for cervical cancer screening   [place] Karle Plumber B, MD   7 months ago Class 1 obesity due to excess calories without serious comorbidity with body mass index (BMI) of 32.0 to 32.9 in adult   [place], MD   9 months ago Essential hypertension   [place], Patrick E, MD   1 year ago Essential hypertension   [place], MD       Future Appointments             In 1 week Paseda, Dewaine Conger, [place]             OLANZapine (ZYPREXA) 2.5 MG tablet 30 tablet 3    Sig: Take 1 tablet (2.5 mg total) by mouth at bedtime as needed (insomnia/sleep).     Not Delegated - Psychiatry:  Antipsychotics - Second Generation  (Atypical) - olanzapine Failed - 07/28/2022  9:26 AM      Failed - This refill cannot be delegated      Failed - Last BP in normal range    BP Readings from Last 1 Encounters:  04/24/22 (!) 137/95         Failed - Lipid Panel in normal range within the last 12 months    Cholesterol, Total  Date Value Ref Range Status  05/16/2021 183 100 - 199 mg/dL Final   Cholesterol  Date Value Ref Range Status  07/20/2022 199 0 - 200 mg/dL Final   LDL Chol Calc (NIH)  Date Value Ref Range Status  05/16/2021 69 0 - 99 mg/dL Final   LDL Cholesterol  Date Value Ref Range Status  07/20/2022 118 (H) 0 - 99 mg/dL Final    Comment:           Total Cholesterol/HDL:CHD Risk Coronary Heart Disease Risk Table                     Men   Women  1/2 Average Risk  3.4   3.3  Average Risk       5.0   4.4  2 X Average Risk   9.6   7.1  3 X Average Risk  23.4   11.0        Use the calculated Patient Ratio above and the CHD Risk Table to determine the patient's CHD Risk.        ATP III CLASSIFICATION (LDL):  <100     mg/dL   Optimal  100-129  mg/dL   Near or Above                    Optimal  130-159  mg/dL   Borderline  160-189  mg/dL   High  >190     mg/dL   Very High Performed at [facility], [address] [place], [place] [address]    HDL  Date Value Ref Range Status  07/20/2022 58 >40 mg/dL Final  05/16/2021 94 >39 mg/dL Final   Triglycerides  Date Value Ref Range Status  07/20/2022 117 <150 mg/dL Final         Failed - CBC within normal limits and completed in the last 12 months    WBC  Date Value Ref Range Status  07/17/2022 6.2 4.0 - 10.5 K/uL Final   RBC  Date Value Ref Range Status  07/17/2022 3.90 3.87 - 5.11 MIL/uL Final   Hemoglobin  Date Value Ref Range Status  07/17/2022 12.1 12.0 - 15.0 g/dL Final  05/16/2021 13.4 11.1 - 15.9 g/dL Final   HCT  Date Value Ref Range Status  07/17/2022 36.6 36.0 - 46.0 % Final   Hematocrit  Date Value Ref  Range Status  05/16/2021 39.7 34.0 - 46.6 % Final   MCHC  Date Value Ref Range Status  07/17/2022 33.1 30.0 - 36.0 g/dL Final   MCH  Date Value Ref Range Status  07/17/2022 31.0 26.0 - 34.0 pg Final   MCV  Date Value Ref Range Status  07/17/2022 93.8 80.0 - 100.0 fL Final  05/16/2021 89 79 - 97 fL Final   No results found for: "PLTCOUNTKUC", "LABPLAT", "POCPLA" RDW  Date Value Ref Range Status  07/17/2022 13.0 11.5 - 15.5 % Final  05/16/2021 13.4 11.7 - 15.4 % Final         Failed - CMP within normal limits and completed in the last 12 months    Albumin  Date Value Ref Range Status  07/15/2022 3.7 3.5 - 5.0 g/dL Final  05/16/2021 4.3 3.8 - 4.8 g/dL Final   Alkaline Phosphatase  Date Value Ref Range Status  07/15/2022 89 38 - 126 U/L Final   ALT  Date Value Ref Range Status  07/15/2022 38 0 - 44 U/L Final   AST  Date Value Ref Range Status  07/15/2022 32 15 - 41 U/L Final   BUN  Date Value Ref Range Status  07/17/2022 9 6 - 20 mg/dL Final  11/11/2021 10 6 - 24 mg/dL Final   Calcium  Date Value Ref Range Status  07/17/2022 9.0 8.9 - 10.3 mg/dL Final   Calcium, Ion  Date Value Ref Range Status  07/15/2022 1.07 (L) 1.15 - 1.40 mmol/L Final   CO2  Date Value Ref Range Status  07/17/2022 22 22 - 32 mmol/L Final   Bicarbonate  Date Value Ref Range Status  07/15/2022 23.1 20.0 - 28.0 mmol/L Final   TCO2  Date Value Ref Range Status  07/15/2022 24   22 - 32 mmol/L Final   Creatinine, Ser  Date Value Ref Range Status  07/17/2022 0.85 0.44 - 1.00 mg/dL Final   Glucose, Bld  Date Value Ref Range Status  07/17/2022 117 (H) 70 - 99 mg/dL Final    Comment:    Glucose reference range applies only to samples taken after fasting for at least 8 hours.   POC Glucose  Date Value Ref Range Status  01/23/2022 168 (A) 70 - 99 mg/dl Final   Glucose-Capillary  Date Value Ref Range Status  12/24/2020 101 (H) 70 - 99 mg/dL Final    Comment:    Glucose reference  range applies only to samples taken after fasting for at least 8 hours.   Potassium  Date Value Ref Range Status  07/17/2022 4.5 3.5 - 5.1 mmol/L Final   Sodium  Date Value Ref Range Status  07/17/2022 137 135 - 145 mmol/L Final  11/11/2021 136 134 - 144 mmol/L Final   Total Bilirubin  Date Value Ref Range Status  07/15/2022 0.4 0.3 - 1.2 mg/dL Final   Bilirubin Total  Date Value Ref Range Status  05/16/2021 0.2 0.0 - 1.2 mg/dL Final   Protein, ur  Date Value Ref Range Status  01/12/2022 NEGATIVE NEGATIVE mg/dL Final   Total Protein  Date Value Ref Range Status  07/15/2022 6.5 6.5 - 8.1 g/dL Final  05/16/2021 6.7 6.0 - 8.5 g/dL Final   GFR calc Af Amer  Date Value Ref Range Status  01/29/2020 >60 >60 mL/min Final   GFR  Date Value Ref Range Status  05/25/2017 75.12 >60.00 mL/min Final   eGFR  Date Value Ref Range Status  11/11/2021 101 >59 mL/min/1.73 Final   GFR, Estimated  Date Value Ref Range Status  07/17/2022 >60 >60 mL/min Final    Comment:    (NOTE) Calculated using the CKD-EPI Creatinine Equation (2021)          Passed - TSH in normal range and within 360 days    TSH  Date Value Ref Range Status  07/20/2022 3.544 0.350 - 4.500 uIU/mL Final    Comment:    Performed by a 3rd Generation assay with a functional sensitivity of <=0.01 uIU/mL. Performed at [facility], [address] [place], [place] [address]   05/16/2021 1.300 0.450 - 4.500 uIU/mL Final         Passed - Completed PHQ-2 or PHQ-9 in the last 360 days      Passed - Last Heart Rate in normal range    Pulse Readings from Last 1 Encounters:  04/24/22 72         Passed - Valid encounter within last 6 months    Recent Outpatient Visits           3 months ago Prediabetes   [facility] Wright, Patrick E, MD   6 months ago Pap smear for cervical cancer screening   [facility] Johnson, Deborah B, MD   7  months ago Class 1 obesity due to excess calories without serious comorbidity with body mass index (BMI) of 32.0 to 32.9 in adult   [facility] Wright, Patrick E, MD   9 months ago Essential hypertension   [facility] Wright, Patrick E, MD   1 year ago Essential hypertension   [facility] Wright, Patrick E, MD         Future Appointments             In 1 week Paseda, Folashade R, FNP [facility]             

## 2022-07-31 ENCOUNTER — Telehealth (INDEPENDENT_AMBULATORY_CARE_PROVIDER_SITE_OTHER): Payer: No Payment, Other | Admitting: Psychiatry

## 2022-07-31 ENCOUNTER — Other Ambulatory Visit: Payer: Self-pay | Admitting: Critical Care Medicine

## 2022-07-31 ENCOUNTER — Telehealth (HOSPITAL_BASED_OUTPATIENT_CLINIC_OR_DEPARTMENT_OTHER): Payer: Self-pay | Admitting: Internal Medicine

## 2022-07-31 ENCOUNTER — Encounter (HOSPITAL_COMMUNITY): Payer: Self-pay | Admitting: Psychiatry

## 2022-07-31 ENCOUNTER — Other Ambulatory Visit: Payer: Self-pay

## 2022-07-31 DIAGNOSIS — F411 Generalized anxiety disorder: Secondary | ICD-10-CM | POA: Diagnosis not present

## 2022-07-31 DIAGNOSIS — I10 Essential (primary) hypertension: Secondary | ICD-10-CM

## 2022-07-31 DIAGNOSIS — F1021 Alcohol dependence, in remission: Secondary | ICD-10-CM

## 2022-07-31 DIAGNOSIS — F3341 Major depressive disorder, recurrent, in partial remission: Secondary | ICD-10-CM | POA: Diagnosis not present

## 2022-07-31 DIAGNOSIS — N3000 Acute cystitis without hematuria: Secondary | ICD-10-CM

## 2022-07-31 MED ORDER — SULFAMETHOXAZOLE-TRIMETHOPRIM 800-160 MG PO TABS
1.0000 | ORAL_TABLET | Freq: Two times a day (BID) | ORAL | 0 refills | Status: DC
  Filled 2022-07-31: qty 14, 7d supply, fill #0

## 2022-07-31 MED ORDER — HYDROXYZINE HCL 50 MG PO TABS
50.0000 mg | ORAL_TABLET | Freq: Three times a day (TID) | ORAL | 3 refills | Status: DC | PRN
  Filled 2022-07-31 – 2022-08-28 (×4): qty 90, 30d supply, fill #0
  Filled 2022-09-18 – 2022-09-20 (×2): qty 90, 30d supply, fill #1
  Filled 2022-10-12: qty 90, 30d supply, fill #2

## 2022-07-31 MED ORDER — GABAPENTIN 600 MG PO TABS
600.0000 mg | ORAL_TABLET | Freq: Two times a day (BID) | ORAL | 3 refills | Status: DC
  Filled 2022-07-31 – 2022-08-16 (×2): qty 60, 30d supply, fill #0

## 2022-07-31 MED ORDER — DESVENLAFAXINE SUCCINATE ER 25 MG PO TB24
75.0000 mg | ORAL_TABLET | Freq: Every day | ORAL | 3 refills | Status: DC
  Filled 2022-07-31 – 2022-08-21 (×2): qty 90, 30d supply, fill #0
  Filled 2022-09-29: qty 90, 30d supply, fill #1
  Filled 2022-10-26: qty 90, 30d supply, fill #2

## 2022-07-31 MED ORDER — CLONIDINE HCL 0.2 MG PO TABS
0.2000 mg | ORAL_TABLET | Freq: Every day | ORAL | 3 refills | Status: DC
  Filled 2022-07-31: qty 30, 30d supply, fill #0
  Filled 2022-08-24: qty 30, 30d supply, fill #1
  Filled 2022-09-18 – 2022-09-20 (×2): qty 30, 30d supply, fill #2
  Filled 2022-10-15: qty 30, 30d supply, fill #3

## 2022-07-31 MED ORDER — MIRTAZAPINE 30 MG PO TBDP
30.0000 mg | ORAL_TABLET | Freq: Every day | ORAL | 3 refills | Status: DC
  Filled 2022-07-31 – 2022-08-10 (×2): qty 30, 30d supply, fill #0
  Filled 2022-09-06: qty 30, 30d supply, fill #1
  Filled 2022-09-29 – 2022-10-03 (×2): qty 30, 30d supply, fill #2

## 2022-07-31 MED ORDER — PROPRANOLOL HCL 10 MG PO TABS
10.0000 mg | ORAL_TABLET | Freq: Every day | ORAL | 3 refills | Status: DC
  Filled 2022-07-31 – 2022-08-28 (×4): qty 30, 30d supply, fill #0
  Filled 2022-09-18 – 2022-09-20 (×2): qty 30, 30d supply, fill #1
  Filled 2022-10-15: qty 30, 30d supply, fill #2
  Filled 2022-11-06: qty 30, 30d supply, fill #3

## 2022-07-31 MED ORDER — OLANZAPINE 5 MG PO TABS
5.0000 mg | ORAL_TABLET | Freq: Every evening | ORAL | 3 refills | Status: DC | PRN
  Filled 2022-07-31: qty 30, 30d supply, fill #0
  Filled 2022-08-16: qty 30, 30d supply, fill #1
  Filled 2022-09-11: qty 30, 30d supply, fill #2
  Filled 2022-10-11: qty 30, 30d supply, fill #3

## 2022-07-31 NOTE — Telephone Encounter (Signed)
Pt had a visit today and the medication  sulfamethoxazole-trimethoprim (BACTRIM DS) 800-160 MG tablet did not get through to the pharmacy / please resend asap / pt also mentioned that she needed refill for her  cloNIDine (CATAPRES) 0.2 MG tablet / she would like to pick them up today at the same time  Margaret Mccann

## 2022-07-31 NOTE — Progress Notes (Signed)
BH MD/PA/NP OP Progress Note Virtual Visit via Video Note  I connected with [name] on 07/31/22 at  4:00 PM EST by a video enabled telemedicine application and verified that I am speaking with the correct person using two identifiers.  Location: Patient: Work Provider: Clinic   I discussed the limitations of evaluation and management by telemedicine and the availability of in person appointments. The patient expressed understanding and agreed to proceed.  I provided 45 minutes of non-face-to-face time during this encounter.   07/31/2022 4:25 PM Jackolyn Confer Romilda Proby  MRN:  [id number]  Chief Complaint: "I am having some anxiety"   HPI: 51 year old female seen today for follow-up psychiatric evaluation.  She was a recently admitted to [facility] for an intentional overdose on clonidine and relapsed on cocaine.  She was restarted on the following medications gabapentin 600 mg twice daily, Remeron 15 mg nightly, Pristiq 75 mg daily, Zyprexa 2.5 mg nightly,  hydroxyzine 50 mg 3 times daily, propanolol 10 mg daily (filled by PCP), and clonidine 0.2 mg nightly (filled by PCP). She notes her medications a somewhat effective in managing her psychiatric conditions.  Today she is well-groomed, pleasant, cooperative, engaged in conversation, maintained eye contact.  She informed Clinical research associate that she is having some anxiety.  She notes that she is worried about getting a job.  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  She notes that having a job as a stipulation to staying with her mother and stepfather.  Today provider conducted a GAD-7 and patient scored a 9, at her last visit she scored a 4.  Provider also conducted PHQ-9 and patient scored a 6.  She endorses sleeping 5 to 6 hours nightly.  Patient reports having a good appetite.  At times she notes that she continues to be irritable and distractible.  Today she  denies SI/HI/VAH or paranoia.    Patient reports that she goes to substance use meetings daily.  She reports that she is hopeful that she will maintain her sobriety.  Patient requested to increase gabapentin to 1200 mg twice daily.  Provider informed patient that at this time that is not recommended.  Provider was agreeable to increasing mirtazapine 15 mg nightly to 30 mg nightly to help manage anxiety, depression, and sleep.  Zyprexa also increased from 2.5 mg to 5 mg to help manage mood.  Propanolol and clonidine filled today by Clinical research associate.  Patient can receive these medications in the future from her PCP.  She will continue her other medication as prescribed.    Visit Diagnosis:    ICD-10-CM   1. GAD (generalized anxiety disorder)  F41.1 desvenlafaxine (PRISTIQ) 25 MG 24 hr tablet    gabapentin (NEURONTIN) 600 MG tablet    hydrOXYzine (ATARAX) 50 MG tablet    mirtazapine (REMERON SOL-TAB) 30 MG disintegrating tablet    2. Major depressive disorder, recurrent episode, in partial remission with anxious distress (HCC)  F33.41 desvenlafaxine (PRISTIQ) 25 MG 24 hr tablet    gabapentin (NEURONTIN) 600 MG tablet    hydrOXYzine (ATARAX) 50 MG tablet    mirtazapine (REMERON SOL-TAB) 30 MG disintegrating tablet    OLANZapine (ZYPREXA) 5 MG tablet    3. Primary hypertension  I10 cloNIDine (CATAPRES) 0.2 MG tablet    propranolol (INDERAL) 10 MG tablet    4. Alcohol use disorder, severe, in early remission (HCC)  F10.21  Past Psychiatric History:  MDD , anxiety, PTSD, Substance abuse (cocaine, alcohol)  Past Medical History:  Past Medical History:  Diagnosis Date   Alcohol withdrawal seizure with complication, with unspecified complication (HCC) 12/23/2020   Anxiety    Bipolar 1 disorder (HCC)    Depression    Intentional drug overdose (HCC) 08/04/2020   Major depressive disorder, recurrent episode with anxious distress (HCC) 08/11/2020   Morgellons syndrome    MRSA (methicillin  resistant Staphylococcus aureus)    Suicide attempt (HCC)    Suicide by drug overdose (HCC) 08/11/2020   Tricuspid valve regurgitation 12/18/2020    Past Surgical History:  Procedure Laterality Date   APPENDECTOMY      Family Psychiatric History: Sister - committed suicide in 2005 by overdosing.  Family History:  Family History  Problem Relation Age of Onset   Heart attack Father 87   Stroke Father    Severe combined immunodeficiency Sister    Breast cancer Paternal Aunt    Ulcerative colitis Neg Hx    Esophageal cancer Neg Hx     Social History:  Social History   Socioeconomic History   Marital status: Single    Spouse name: Not on file   Number of children: Not on file   Years of education: Not on file   Highest education level: Not on file  Occupational History   Not on file  Tobacco Use   Smoking status: Some Days    Packs/day: 0.25    Years: 5.00    Total pack years: 1.25    Types: Cigarettes   Smokeless tobacco: Never  Vaping Use   Vaping Use: Never used  Substance and Sexual Activity   Alcohol use: Yes    Comment: occas   Drug use: Not Currently    Types: Cocaine    Comment: relapsed the past weekend, no cocaine use prior 6 months   Sexual activity: Not Currently    Birth control/protection: Pill  Other Topics Concern   Not on file  Social History Narrative   ** Merged History Encounter **       Social Determinants of Health   Financial Resource Strain: Not on file  Food Insecurity: No Food Insecurity (07/18/2022)   Hunger Vital Sign    Worried About Running Out of Food in the Last Year: Never true    Ran Out of Food in the Last Year: Never true  Transportation Needs: No Transportation Needs (07/18/2022)   PRAPARE - Administrator, Civil Service (Medical): No    Lack of Transportation (Non-Medical): No  Physical Activity: Not on file  Stress: Not on file  Social Connections: Not on file    Allergies:  Allergies  Allergen  Reactions   Lamictal [Lamotrigine] Anaphylaxis, Rash and Other (See Comments)    Stevens-Johnson syndrome and fevers, also   Lamictal [Lamotrigine] Anaphylaxis, Rash and Other (See Comments)    Fevers and Stevens-Johnson syndrome, also   Tramadol Other (See Comments)    Pt had a seizure after taking Tramadol!!   Geodon [Ziprasidone Hcl] Rash   Levetiracetam Anxiety    Metabolic Disorder Labs: Lab Results  Component Value Date   HGBA1C 5.7 (H) 07/20/2022   MPG 116.89 07/20/2022   MPG 114.02 12/19/2020   No results found for: "PROLACTIN" Lab Results  Component Value Date   CHOL 199 07/20/2022   TRIG 117 07/20/2022   HDL 58 07/20/2022   CHOLHDL 3.4 07/20/2022   VLDL 23 07/20/2022  Sigel 118 (H) 07/20/2022   LDLCALC 69 05/16/2021   Lab Results  Component Value Date   TSH 3.544 07/20/2022   TSH 1.300 05/16/2021    Therapeutic Level Labs: No results found for: "LITHIUM" No results found for: "VALPROATE" No results found for: "CBMZ"  Current Medications: Current Outpatient Medications  Medication Sig Dispense Refill   atorvastatin (LIPITOR) 20 MG tablet Take 1 tablet (20 mg total) by mouth at bedtime. 60 tablet 4   cloNIDine (CATAPRES) 0.2 MG tablet Take 1 tablet (0.2 mg total) by mouth at bedtime. 30 tablet 3   desvenlafaxine (PRISTIQ) 25 MG 24 hr tablet Take 3 tablets (75 mg total) by mouth daily. 90 tablet 3   gabapentin (NEURONTIN) 600 MG tablet Take 1 tablet (600 mg total) by mouth every 12 (twelve) hours. 60 tablet 3   hydrOXYzine (ATARAX) 50 MG tablet Take 1 tablet (50 mg total) by mouth 3 (three) times daily as needed for anxiety. 90 tablet 3   melatonin 3 MG TABS tablet Take 1 tablet (3 mg total) by mouth at bedtime.  0   mirtazapine (REMERON SOL-TAB) 30 MG disintegrating tablet Take 1 tablet (30 mg total) by mouth at bedtime. 30 tablet 3   OLANZapine (ZYPREXA) 5 MG tablet Take 1 tablet (5 mg total) by mouth at bedtime as needed (insomnia/sleep). 30 tablet 3    pantoprazole (PROTONIX) 20 MG tablet Take 1 tablet (20 mg total) by mouth daily. 60 tablet 4   propranolol (INDERAL) 10 MG tablet Take 1 tablet (10 mg total) by mouth daily. 30 tablet 3   sulfamethoxazole-trimethoprim (BACTRIM DS) 800-160 MG tablet Take 1 tablet by mouth 2 (two) times daily. 14 tablet 0   valsartan (DIOVAN) 80 MG tablet Take 1 tablet (80 mg total) by mouth daily. 60 tablet 3   No current facility-administered medications for this visit.     Musculoskeletal: Strength & Muscle Tone: within normal limits, telehealth visit Gait & Station: normal, telehealth visit Patient leans: N/A  Psychiatric Specialty Exam: Review of Systems  Last menstrual period 10/27/2019.There is no height or weight on file to calculate BMI.  General Appearance: Well Groomed  Eye Contact:  Good  Speech:  Clear and Coherent and Normal Rate  Volume:  Normal  Mood:  Anxious  Affect:  Appropriate and Congruent  Thought Process:  Coherent, Goal Directed, and Linear  Orientation:  Full (Time, Place, and Person)  Thought Content: WDL and Logical   Suicidal Thoughts:  No  Homicidal Thoughts:  No  Memory:  Immediate;   Good Recent;   Good Remote;   Good  Judgement:  Good  Insight:  Good  Psychomotor Activity:  Normal  Concentration:  Concentration: Good and Attention Span: Good  Recall:  Good  Fund of Knowledge: Good  Language: Good  Akathisia:  No  Handed:  Right  AIMS (if indicated): not done  Assets:  Communication Skills Desire for Improvement Financial Resources/Insurance Housing  ADL's:  Intact  Cognition: WNL  Sleep:  Fair   Screenings: AIMS    Flowsheet Row Admission (Discharged) from 06/04/2018 in [facility]  AIMS Total Score 0      AUDIT    Flowsheet Row Admission (Discharged) from 07/18/2022 in [facility] Admission (Discharged) from 08/11/2020 in [facility] Admission  (Discharged) from 06/04/2018 in [facility]  Alcohol Use Disorder Identification Test Final Score (AUDIT) 6 11 0  GAD-7    Flowsheet Row [facility]  Total GAD-7 Score 9 4 3 2 3       PHQ2-9    Flowsheet Row [facility]  PHQ-2 Total Score 1 0 0 0 0  PHQ-9 Total Score 6 0 -- 1 6      Flowsheet Row [facility]) [facility] from 01/12/2022 in [facility]  C-SSRS RISK CATEGORY No Risk No Risk No Risk        Assessment and Plan: Patient endorses increased anxiety, irritability, poor sleep, and early remission of alcohol. Patient requested to increase gabapentin to 1200 mg twice daily.  Provider informed patient that at this time that is not recommended.  Provider was agreeable to increasing mirtazapine 15 mg nightly to 30 mg nightly to help manage anxiety, depression, and sleep.  Zyprexa also increased from 2.5 mg to 5 mg to help manage mood.  Propanolol and clonidine filled today by Clinical research associatewriter.  Patient can receive these medications in the future from her PCP.  She will continue her other medication as prescribed.  1.  Major depressive disorder, recurrent episode, in partial remission with anxious distress (HCC)  Continue- desvenlafaxine (PRISTIQ) 25 MG 24 hr tablet; Take 3 tablets (75 mg total) by mouth daily.  Dispense: 90 tablet; Refill: 3 Continue- gabapentin (NEURONTIN) 600 MG tablet; Take 1 tablet (600 mg total) by mouth every 12 (twelve) hours.  Dispense: 60 tablet; Refill: 3 Continue- hydrOXYzine (ATARAX) 50 MG tablet; Take 1 tablet (50 mg total) by mouth 3 (three) times daily as needed for anxiety.  Dispense: 90 tablet; Refill: 3 Increased- mirtazapine (REMERON SOL-TAB) 30 MG disintegrating tablet; Take 1 tablet (30 mg total) by mouth at bedtime.  Dispense: 30 tablet; Refill: 3 Increased- OLANZapine (ZYPREXA) 5 MG tablet; Take 1 tablet (5 mg total) by mouth at bedtime as needed (insomnia/sleep).  Dispense: 30 tablet; Refill: 3  2. GAD (generalized anxiety disorder)  Increased- mirtazapine (REMERON SOL-TAB) 30 MG disintegrating tablet; Take 1 tablet (30 mg total) by mouth at bedtime.  Dispense: 30 tablet; Refill: 3 follow-up Continue- desvenlafaxine (PRISTIQ) 25 MG 24 hr tablet; Take 3 tablets (75 mg total) by mouth daily.  Dispense: 90 tablet; Refill: 3 Continue- gabapentin (NEURONTIN) 600 MG tablet; Take 1 tablet (600 mg total) by mouth every 12 (twelve) hours.  Dispense: 60 tablet; Refill: 3 Continue- hydrOXYzine (ATARAX) 50 MG tablet; Take 1 tablet (50 mg total) by mouth 3 (three) times daily as needed for anxiety.  Dispense: 90 tablet; Refill: 3  3. Primary hypertension  Continue- cloNIDine (CATAPRES) 0.2 MG tablet; Take 1 tablet (0.2 mg total) by mouth at bedtime.  Dispense: 30 tablet; Refill: 3 Continue- propranolol (INDERAL) 10 MG tablet; Take 1 tablet (10 mg total) by mouth daily.  Dispense: 30 tablet; Refill: 3  4. Alcohol  use disorder, severe, in early remission (HCC)    Follow up in 3 months  Shanna Cisco, NP 07/31/2022, 4:25 PM

## 2022-07-31 NOTE — Addendum Note (Signed)
Addended by: Karle Plumber B on: 07/31/2022 09:54 AM   Modules accepted: Level of Service

## 2022-07-31 NOTE — Progress Notes (Signed)
Virtual Visit via Video Note  I connected with Margaret Mccann on 07/31/2022 at 8:12 AM by a video enabled telemedicine application and verified that I am speaking with the correct person using two identifiers.  Location: Patient: home Provider: Office   I discussed the limitations of evaluation and management by telemedicine and the availability of in person appointments. The patient expressed understanding and agreed to proceed.  History of Present Illness: Patient with history of MDD, EtOH use disorder, cocaine use disorder, GAD, PTSD, obesity, tobacco use, HTN, prediabetes  Patient complains of having a UTI x3 days.  Symptoms include dysuria, frequency, pressure when urinating.  She started taking Azo over-the-counter.  Denies any fever, lower back pain or flank pain or hematuria.  However she states that her urine has turned orange from taking the Azo.  She has no allergies to antibiotics.  And note that she was recently had psychiatric admission for attempted suicide with MDD.  I inquired whether she is plugged into behavioral health.  Patient states that she has already seen her behavioral health provider Ms. Britney Pehrson and has another video visit with her today.    Outpatient Encounter Medications as of 07/31/2022  Medication Sig   atorvastatin (LIPITOR) 20 MG tablet Take 1 tablet (20 mg total) by mouth at bedtime.   cloNIDine (CATAPRES) 0.2 MG tablet Take 1 tablet (0.2 mg total) by mouth at bedtime for 14 days.   desvenlafaxine (PRISTIQ) 25 MG 24 hr tablet Take 3 tablets (75 mg total) by mouth daily.   gabapentin (NEURONTIN) 600 MG tablet Take 1 tablet (600 mg total) by mouth every 12 (twelve) hours.   hydrOXYzine (ATARAX) 50 MG tablet Take 1 tablet (50 mg total) by mouth 3 (three) times daily as needed for anxiety.   melatonin 3 MG TABS tablet Take 1 tablet (3 mg total) by mouth at bedtime.   mirtazapine (REMERON SOL-TAB) 15 MG disintegrating tablet Take 1 tablet  (15 mg total) by mouth at bedtime.   OLANZapine (ZYPREXA) 2.5 MG tablet Take 1 tablet (2.5 mg total) by mouth at bedtime as needed (insomnia/sleep).   pantoprazole (PROTONIX) 20 MG tablet Take 1 tablet (20 mg total) by mouth daily.   propranolol (INDERAL) 10 MG tablet Take 1 tablet (10 mg total) by mouth daily.   valsartan (DIOVAN) 80 MG tablet Take 1 tablet (80 mg total) by mouth daily.   No facility-administered encounter medications on file as of 07/31/2022.      Observations/Objective:  Middle-age Caucasian female lying in bed in NAD    Chemistry      Component Value Date/Time   NA 137 07/17/2022 0729   NA 136 11/11/2021 1104   K 4.5 07/17/2022 0729   CL 108 07/17/2022 0729   CO2 22 07/17/2022 0729   BUN 9 07/17/2022 0729   BUN 10 11/11/2021 1104   CREATININE 0.85 07/17/2022 0729      Component Value Date/Time   CALCIUM 9.0 07/17/2022 0729   ALKPHOS 89 07/15/2022 2130   AST 32 07/15/2022 2130   ALT 38 07/15/2022 2130   BILITOT 0.4 07/15/2022 2130   BILITOT 0.2 05/16/2021 0930       Assessment and Plan: 1. Acute cystitis without hematuria Prescription sent to the pharmacy for Bactrim antibiotic to take twice a day for 7.  Follow-up if no improvement.   Follow Up Instructions: PRN   I discussed the assessment and treatment plan with the patient. The patient was provided an opportunity to  ask questions and all were answered. The patient agreed with the plan and demonstrated an understanding of the instructions.   The patient was advised to call back or seek an in-person evaluation if the symptoms worsen or if the condition fails to improve as anticipated.  I spent 5 minutes dedicated to the care of this patient on the date of this encounter to include previsit review of patient's chart, face-to-face time with patient discussing diagnosis and management and post visit ordering of medication.  This note has been created with Engineer, agricultural. Any transcriptional errors are unintentional.  Karle Plumber, MD

## 2022-08-01 ENCOUNTER — Other Ambulatory Visit: Payer: Self-pay

## 2022-08-02 ENCOUNTER — Other Ambulatory Visit: Payer: Self-pay

## 2022-08-04 ENCOUNTER — Inpatient Hospital Stay: Payer: Self-pay | Admitting: Nurse Practitioner

## 2022-08-07 ENCOUNTER — Other Ambulatory Visit: Payer: Self-pay

## 2022-08-08 ENCOUNTER — Other Ambulatory Visit: Payer: Self-pay

## 2022-08-11 ENCOUNTER — Other Ambulatory Visit: Payer: Self-pay

## 2022-08-14 ENCOUNTER — Other Ambulatory Visit: Payer: Self-pay

## 2022-08-15 ENCOUNTER — Other Ambulatory Visit: Payer: Self-pay

## 2022-08-16 ENCOUNTER — Other Ambulatory Visit: Payer: Self-pay

## 2022-08-21 ENCOUNTER — Other Ambulatory Visit: Payer: Self-pay

## 2022-08-22 ENCOUNTER — Other Ambulatory Visit: Payer: Self-pay

## 2022-08-24 ENCOUNTER — Other Ambulatory Visit (HOSPITAL_COMMUNITY): Payer: Self-pay

## 2022-08-24 ENCOUNTER — Other Ambulatory Visit: Payer: Self-pay | Admitting: Critical Care Medicine

## 2022-08-24 DIAGNOSIS — E782 Mixed hyperlipidemia: Secondary | ICD-10-CM

## 2022-08-24 DIAGNOSIS — F3341 Major depressive disorder, recurrent, in partial remission: Secondary | ICD-10-CM

## 2022-08-24 DIAGNOSIS — F411 Generalized anxiety disorder: Secondary | ICD-10-CM

## 2022-08-24 DIAGNOSIS — I10 Essential (primary) hypertension: Secondary | ICD-10-CM

## 2022-08-24 MED ORDER — VALSARTAN 80 MG PO TABS
80.0000 mg | ORAL_TABLET | Freq: Every day | ORAL | 0 refills | Status: DC
  Filled 2022-08-24 – 2022-08-28 (×4): qty 30, 30d supply, fill #0

## 2022-08-24 MED ORDER — PANTOPRAZOLE SODIUM 20 MG PO TBEC
20.0000 mg | DELAYED_RELEASE_TABLET | Freq: Every day | ORAL | 0 refills | Status: DC
  Filled 2022-08-24 – 2022-08-28 (×4): qty 30, 30d supply, fill #0

## 2022-08-24 MED ORDER — ATORVASTATIN CALCIUM 20 MG PO TABS
20.0000 mg | ORAL_TABLET | Freq: Every day | ORAL | 0 refills | Status: DC
  Filled 2022-08-24 – 2022-08-28 (×4): qty 30, 30d supply, fill #0

## 2022-08-24 NOTE — Telephone Encounter (Signed)
Requested medication (s) are due for refill today: Yes  Requested medication (s) are on the active medication list: Yes  Last refill:  03/2022 and 07/31/22  Future visit scheduled: No  Notes to clinic:  See notes, requesting Dr. Joya Gaskins to send in refills as he did before, see notes.     Requested Prescriptions  Pending Prescriptions Disp Refills   cloNIDine (CATAPRES) 0.2 MG tablet 30 tablet 3    Sig: Take 1 tablet (0.2 mg total) by mouth at bedtime.     Cardiovascular:  Alpha-2 Agonists Failed - 08/24/2022  1:40 PM      Failed - Last BP in normal range    BP Readings from Last 1 Encounters:  04/24/22 (!) 137/95         Passed - Last Heart Rate in normal range    Pulse Readings from Last 1 Encounters:  04/24/22 72         Passed - Valid encounter within last 6 months    Recent Outpatient Visits           3 weeks ago Acute cystitis without hematuria   [place], MD   4 months ago Prediabetes   [place] Elsie Stain, MD   7 months ago Pap smear for cervical cancer screening   [place] Ladell Pier, MD   8 months ago Class 1 obesity due to excess calories without serious comorbidity with body mass index (BMI) of 32.0 to 32.9 in adult   [place] Elsie Stain, MD   10 months ago Essential hypertension   [place] Elsie Stain, MD               propranolol (INDERAL) 10 MG tablet 30 tablet 3    Sig: Take 1 tablet (10 mg total) by mouth daily.     Cardiovascular:  Beta Blockers Failed - 08/24/2022  1:40 PM      Failed - Last BP in normal range    BP Readings from Last 1 Encounters:  04/24/22 (!) 137/95         Passed - Last Heart Rate in normal range    Pulse Readings from Last 1 Encounters:  04/24/22 72         Passed - Valid encounter within last 6 months     Recent Outpatient Visits           3 weeks ago Acute cystitis without hematuria   [place], MD   4 months ago Prediabetes   [place] Elsie Stain, MD   7 months ago Pap smear for cervical cancer screening   [place] Ladell Pier, MD   8 months ago Class 1 obesity due to excess calories without serious comorbidity with body mass index (BMI) of 32.0 to 32.9 in adult   [place], MD   10 months ago Essential hypertension   [place] Elsie Stain, MD               atorvastatin (LIPITOR) 20 MG tablet 60 tablet 4    Sig: Take 1 tablet (20 mg total) by mouth at bedtime.     Cardiovascular:  Antilipid - Statins  Failed - 08/24/2022  1:40 PM      Failed - Lipid Panel in normal range within the last 12 months    Cholesterol, Total  Date Value Ref Range Status  05/16/2021 183 100 - 199 mg/dL Final   Cholesterol  Date Value Ref Range Status  07/20/2022 199 0 - 200 mg/dL Final   LDL Chol Calc (NIH)  Date Value Ref Range Status  05/16/2021 69 0 - 99 mg/dL Final   LDL Cholesterol  Date Value Ref Range Status  07/20/2022 118 (H) 0 - 99 mg/dL Final    Comment:           Total Cholesterol/HDL:CHD Risk Coronary Heart Disease Risk Table                     Men   Women  1/2 Average Risk   3.4   3.3  Average Risk       5.0   4.4  2 X Average Risk   9.6   7.1  3 X Average Risk  23.4   11.0        Use the calculated Patient Ratio above and the CHD Risk Table to determine the patient's CHD Risk.        ATP III CLASSIFICATION (LDL):  <100     mg/dL   Optimal  100-129  mg/dL   Near or Above                    Optimal  130-159  mg/dL   Borderline  160-189  mg/dL   High  >190     mg/dL   Very High Performed at [facility] [address]  [place], [place] [address]    HDL  Date Value Ref Range Status  07/20/2022 58 >40 mg/dL Final  05/16/2021 94 >39 mg/dL Final   Triglycerides  Date Value Ref Range Status  07/20/2022 117 <150 mg/dL Final         Passed - Patient is not pregnant      Passed - Valid encounter within last 12 months    Recent Outpatient Visits           3 weeks ago Acute cystitis without hematuria   [place], Deborah B, MD   4 months ago Prediabetes   [place] Elsie Stain, MD   7 months ago Pap smear for cervical cancer screening   [place] Ladell Pier, MD   8 months ago Class 1 obesity due to excess calories without serious comorbidity with body mass index (BMI) of 32.0 to 32.9 in adult   [place] Elsie Stain, MD   10 months ago Essential hypertension   [place] Elsie Stain, MD               valsartan (DIOVAN) 80 MG tablet 60 tablet 3    Sig: Take 1 tablet (80 mg total) by mouth daily.     Cardiovascular:  Angiotensin Receptor Blockers Failed - 08/24/2022  1:40 PM      Failed - Last BP in normal range    BP Readings from Last 1 Encounters:  04/24/22 (!) 137/95         Passed - Cr in normal range and within 180 days    Creatinine, Ser  Date Value Ref  Range Status  07/17/2022 0.85 0.44 - 1.00 mg/dL Final         Passed - K in normal range and within 180 days    Potassium  Date Value Ref Range Status  07/17/2022 4.5 3.5 - 5.1 mmol/L Final         Passed - Patient is not pregnant      Passed - Valid encounter within last 6 months    Recent Outpatient Visits           3 weeks ago Acute cystitis without hematuria   [place], Deborah B, MD   4 months ago Prediabetes   [place] Elsie Stain, MD   7 months ago  Pap smear for cervical cancer screening   [place] Ladell Pier, MD   8 months ago Class 1 obesity due to excess calories without serious comorbidity with body mass index (BMI) of 32.0 to 32.9 in adult   [place] Elsie Stain, MD   10 months ago Essential hypertension   [place] Elsie Stain, MD               pantoprazole (PROTONIX) 20 MG tablet 60 tablet 4    Sig: Take 1 tablet (20 mg total) by mouth daily.     Gastroenterology: Proton Pump Inhibitors Passed - 08/24/2022  1:40 PM      Passed - Valid encounter within last 12 months    Recent Outpatient Visits           3 weeks ago Acute cystitis without hematuria   [place], Deborah B, MD   4 months ago Prediabetes   [place] Elsie Stain, MD   7 months ago Pap smear for cervical cancer screening   [place] Ladell Pier, MD   8 months ago Class 1 obesity due to excess calories without serious comorbidity with body mass index (BMI) of 32.0 to 32.9 in adult   [place] Elsie Stain, MD   10 months ago Essential hypertension   [place] Elsie Stain, MD               gabapentin (NEURONTIN) 600 MG tablet 60 tablet 3    Sig: Take 1 tablet (600 mg total) by mouth every 12 (twelve) hours.     Neurology: Anticonvulsants - gabapentin Passed - 08/24/2022  1:40 PM      Passed - Cr in normal range and within 360 days    Creatinine, Ser  Date Value Ref Range Status  07/17/2022 0.85 0.44 - 1.00 mg/dL Final         Passed - Completed PHQ-2 or PHQ-9 in the last 360 days      Passed - Valid encounter within last 12 months    Recent Outpatient Visits           3 weeks ago Acute cystitis without hematuria   [place] Ladell Pier, MD   4 months ago Prediabetes   [place] Elsie Stain, MD   7 months ago Pap smear for cervical cancer screening   [place] Ladell Pier, MD   8  months ago Class 1 obesity due to excess calories without serious comorbidity with body mass index (BMI) of 32.0 to 32.9 in adult   [place] Elsie Stain, MD   10 months ago Essential hypertension   [place] Elsie Stain, MD               desvenlafaxine (PRISTIQ) 25 MG 24 hr tablet 90 tablet 3    Sig: Take 3 tablets (75 mg total) by mouth daily.     Psychiatry: Antidepressants - SNRI - desvenlafaxine & venlafaxine Failed - 08/24/2022  1:40 PM      Failed - Last BP in normal range    BP Readings from Last 1 Encounters:  04/24/22 (!) 137/95         Failed - Lipid Panel in normal range within the last 12 months    Cholesterol, Total  Date Value Ref Range Status  05/16/2021 183 100 - 199 mg/dL Final   Cholesterol  Date Value Ref Range Status  07/20/2022 199 0 - 200 mg/dL Final   LDL Chol Calc (NIH)  Date Value Ref Range Status  05/16/2021 69 0 - 99 mg/dL Final   LDL Cholesterol  Date Value Ref Range Status  07/20/2022 118 (H) 0 - 99 mg/dL Final    Comment:           Total Cholesterol/HDL:CHD Risk Coronary Heart Disease Risk Table                     Men   Women  1/2 Average Risk   3.4   3.3  Average Risk       5.0   4.4  2 X Average Risk   9.6   7.1  3 X Average Risk  23.4   11.0        Use the calculated Patient Ratio above and the CHD Risk Table to determine the patient's CHD Risk.        ATP III CLASSIFICATION (LDL):  <100     mg/dL   Optimal  100-129  mg/dL   Near or Above                    Optimal  130-159  mg/dL   Borderline  160-189  mg/dL   High  >190     mg/dL   Very High Performed at [facility] [address], [place], [place] [address]    HDL  Date Value Ref Range Status  07/20/2022 58 >40 mg/dL Final  05/16/2021 94 >39 mg/dL Final   Triglycerides  Date Value Ref Range Status  07/20/2022 117 <150 mg/dL Final         Passed - Cr in normal range and within 360 days    Creatinine, Ser  Date Value Ref Range Status  07/17/2022 0.85 0.44 - 1.00 mg/dL Final         Passed - Completed PHQ-2 or PHQ-9 in the last 360 days      Passed - Valid encounter within last 6 months    Recent Outpatient Visits           3 weeks ago Acute cystitis without hematuria   [place], MD   4 months ago Prediabetes   [place] Elsie Stain, MD   7 months ago Pap smear for cervical  cancer screening   [place] Karle Plumber B, MD   8 months ago Class 1 obesity due to excess calories without serious comorbidity with body mass index (BMI) of 32.0 to 32.9 in adult   [place] Elsie Stain, MD   10 months ago Essential hypertension   [place] Elsie Stain, MD               OLANZapine (ZYPREXA) 5 MG tablet 30 tablet 3    Sig: Take 1 tablet (5 mg total) by mouth at bedtime as needed (insomnia/sleep).     Not Delegated - Psychiatry:  Antipsychotics - Second Generation (Atypical) - olanzapine Failed - 08/24/2022  1:40 PM      Failed - This refill cannot be delegated      Failed - Last BP in normal range    BP Readings from Last 1 Encounters:  04/24/22 (!) 137/95         Failed - Lipid Panel in normal range within the last 12 months    Cholesterol, Total  Date Value Ref Range Status  05/16/2021 183 100 - 199 mg/dL Final   Cholesterol  Date Value Ref Range Status  07/20/2022 199 0 - 200 mg/dL Final   LDL Chol Calc (NIH)  Date Value Ref Range Status  05/16/2021 69 0 - 99 mg/dL  Final   LDL Cholesterol  Date Value Ref Range Status  07/20/2022 118 (H) 0 - 99 mg/dL Final    Comment:           Total Cholesterol/HDL:CHD Risk Coronary Heart Disease Risk Table                     Men   Women  1/2 Average Risk   3.4   3.3  Average Risk       5.0   4.4  2 X Average Risk   9.6   7.1  3 X Average Risk  23.4   11.0        Use the calculated Patient Ratio above and the CHD Risk Table to determine the patient's CHD Risk.        ATP III CLASSIFICATION (LDL):  <100     mg/dL   Optimal  100-129  mg/dL   Near or Above                    Optimal  130-159  mg/dL   Borderline  160-189  mg/dL   High  >190     mg/dL   Very High Performed at [facility] [address], [place], [place] [address]    HDL  Date Value Ref Range Status  07/20/2022 58 >40 mg/dL Final  05/16/2021 94 >39 mg/dL Final   Triglycerides  Date Value Ref Range Status  07/20/2022 117 <150 mg/dL Final         Passed - TSH in normal range and within 360 days    TSH  Date Value Ref Range Status  07/20/2022 3.544 0.350 - 4.500 uIU/mL Final    Comment:    Performed by a 3rd Generation assay with a functional sensitivity of <=0.01 uIU/mL. Performed at [facility], [place] [address], [place], [place] [address]   05/16/2021 1.300 0.450 - 4.500 uIU/mL Final         Passed - Completed PHQ-2 or PHQ-9 in the last 360  days      Passed - Last Heart Rate in normal range    Pulse Readings from Last 1 Encounters:  04/24/22 72         Passed - Valid encounter within last 6 months    Recent Outpatient Visits           3 weeks ago Acute cystitis without hematuria   [place] Ladell Pier, MD   4 months ago Prediabetes   [place] Elsie Stain, MD   7 months ago Pap smear for cervical cancer screening   [place] Ladell Pier, MD   8 months ago Class  1 obesity due to excess calories without serious comorbidity with body mass index (BMI) of 32.0 to 32.9 in adult   [place] Elsie Stain, MD   10 months ago Essential hypertension   [place] Elsie Stain, MD              Passed - CBC within normal limits and completed in the last 12 months    WBC  Date Value Ref Range Status  07/17/2022 6.2 4.0 - 10.5 K/uL Final   RBC  Date Value Ref Range Status  07/17/2022 3.90 3.87 - 5.11 MIL/uL Final   Hemoglobin  Date Value Ref Range Status  07/17/2022 12.1 12.0 - 15.0 g/dL Final  05/16/2021 13.4 11.1 - 15.9 g/dL Final   HCT  Date Value Ref Range Status  07/17/2022 36.6 36.0 - 46.0 % Final   Hematocrit  Date Value Ref Range Status  05/16/2021 39.7 34.0 - 46.6 % Final   MCHC  Date Value Ref Range Status  07/17/2022 33.1 30.0 - 36.0 g/dL Final   [facility]  Date Value Ref Range Status  07/17/2022 31.0 26.0 - 34.0 pg Final   MCV  Date Value Ref Range Status  07/17/2022 93.8 80.0 - 100.0 fL Final  05/16/2021 89 79 - 97 fL Final   No results found for: "PLTCOUNTKUC", "LABPLAT", "POCPLA" RDW  Date Value Ref Range Status  07/17/2022 13.0 11.5 - 15.5 % Final  05/16/2021 13.4 11.7 - 15.4 % Final         Passed - CMP within normal limits and completed in the last 12 months    Albumin  Date Value Ref Range Status  07/15/2022 3.7 3.5 - 5.0 g/dL Final  05/16/2021 4.3 3.8 - 4.8 g/dL Final   Alkaline Phosphatase  Date Value Ref Range Status  07/15/2022 89 38 - 126 U/L Final   ALT  Date Value Ref Range Status  07/15/2022 38 0 - 44 U/L Final   AST  Date Value Ref Range Status  07/15/2022 32 15 - 41 U/L Final   BUN  Date Value Ref Range Status  07/17/2022 9 6 - 20 mg/dL Final  11/11/2021 10 6 - 24 mg/dL Final   Calcium  Date Value Ref Range Status  07/17/2022 9.0 8.9 - 10.3 mg/dL Final   Calcium, Ion  Date Value Ref Range Status  07/15/2022 1.07  (L) 1.15 - 1.40 mmol/L Final   CO2  Date Value Ref Range Status  07/17/2022 22 22 - 32 mmol/L Final   Bicarbonate  Date Value Ref Range Status  07/15/2022 23.1 20.0 - 28.0 mmol/L Final   TCO2  Date Value Ref Range Status  07/15/2022 24 22 - 32 mmol/L Final  Creatinine, Ser  Date Value Ref Range Status  07/17/2022 0.85 0.44 - 1.00 mg/dL Final   Glucose, Bld  Date Value Ref Range Status  07/17/2022 117 (H) 70 - 99 mg/dL Final    Comment:    Glucose reference range applies only to samples taken after fasting for at least 8 hours.   POC Glucose  Date Value Ref Range Status  01/23/2022 168 (A) 70 - 99 mg/dl Final   Glucose-Capillary  Date Value Ref Range Status  12/24/2020 101 (H) 70 - 99 mg/dL Final    Comment:    Glucose reference range applies only to samples taken after fasting for at least 8 hours.   Potassium  Date Value Ref Range Status  07/17/2022 4.5 3.5 - 5.1 mmol/L Final   Sodium  Date Value Ref Range Status  07/17/2022 137 135 - 145 mmol/L Final  11/11/2021 136 134 - 144 mmol/L Final   Total Bilirubin  Date Value Ref Range Status  07/15/2022 0.4 0.3 - 1.2 mg/dL Final   Bilirubin Total  Date Value Ref Range Status  05/16/2021 0.2 0.0 - 1.2 mg/dL Final   Protein, ur  Date Value Ref Range Status  01/12/2022 NEGATIVE NEGATIVE mg/dL Final   Total Protein  Date Value Ref Range Status  07/15/2022 6.5 6.5 - 8.1 g/dL Final  05/16/2021 6.7 6.0 - 8.5 g/dL Final   GFR calc Af Amer  Date Value Ref Range Status  01/29/2020 >60 >60 mL/min Final   GFR  Date Value Ref Range Status  05/25/2017 75.12 >60.00 mL/min Final   eGFR  Date Value Ref Range Status  11/11/2021 101 >59 mL/min/1.73 Final   GFR, Estimated  Date Value Ref Range Status  07/17/2022 >60 >60 mL/min Final    Comment:    (NOTE) Calculated using the CKD-EPI Creatinine Equation (2021)           mirtazapine (REMERON SOL-TAB) 30 MG disintegrating tablet 30 tablet 3    Sig: Take 1  tablet (30 mg total) by mouth at bedtime.     Psychiatry: Antidepressants - mirtazapine Passed - 08/24/2022  1:40 PM      Passed - Completed PHQ-2 or PHQ-9 in the last 360 days      Passed - Valid encounter within last 6 months    Recent Outpatient Visits           3 weeks ago Acute cystitis without hematuria   [place], Deborah B, MD   4 months ago Prediabetes   [place] Elsie Stain, MD   7 months ago Pap smear for cervical cancer screening   [place] Ladell Pier, MD   8 months ago Class 1 obesity due to excess calories without serious comorbidity with body mass index (BMI) of 32.0 to 32.9 in adult   [place], Patrick E, MD   10 months ago Essential hypertension   [place], Patrick E, MD

## 2022-08-24 NOTE — Telephone Encounter (Signed)
Patient called back to add med, visteril 100mg .  [facility] Phone: [phone]  Fax: [phone]
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
## 2022-08-24 NOTE — Telephone Encounter (Signed)
Medication Refill - Medication: propranolol (INDERAL) 10 MG tablet, cloNIDine (CATAPRES) 0.2 MG tablet, atorvastatin (LIPITOR) 20 MG tablet , valsartan (DIOVAN) 80 MG tablet , pantoprazole (PROTONIX) 20 MG tablet , gabapentin (NEURONTIN) 600 MG tablet , desvenlafaxine (PRISTIQ) 25 MG 24 hr tablet , OLANZapine (ZYPREXA) 5 MG tablet , mirtazapine (REMERON SOL-TAB) 30 MG disintegrating tablet   Has the patient contacted their pharmacy? No.   Preferred Pharmacy (with phone number or street name):  [place] [facility] Phone: [phone]  Fax: [phone]     Has the patient been seen for an appointment in the last year OR does the patient have an upcoming appointment? Yes.    The patient called in stating her provider helped her last year when she lost her job with a short term refill on her medications. She states the provider refilled her prescriptions for free for 2 months on a short term basis since she had no insurance or job at the time. She has run into that again as she was just let go from her job and needs her medication refills on 10 meds including 5 meds that her psychiatrist Dr Doyne Keel wrote for her. She states Dr Doyne Keel got promoted and is taking a new position. She has 2 pills left on her clonidine and propanalol. Please assist patient further

## 2022-08-25 ENCOUNTER — Other Ambulatory Visit: Payer: Self-pay

## 2022-08-25 ENCOUNTER — Other Ambulatory Visit (HOSPITAL_COMMUNITY): Payer: Self-pay

## 2022-08-25 MED ORDER — GABAPENTIN 600 MG PO TABS
600.0000 mg | ORAL_TABLET | Freq: Two times a day (BID) | ORAL | 3 refills | Status: DC
  Filled 2022-08-25 – 2022-09-18 (×2): qty 60, 30d supply, fill #0
  Filled 2022-10-12: qty 60, 30d supply, fill #1

## 2022-08-25 NOTE — Telephone Encounter (Signed)
Provider attempted to call patient without success.  Provider left voicemail informing patient to walk into the clinic on Monday or Tuesday between the hours of 8 and 11.  Provider also informed patient that she can call the clinic back with further questions.

## 2022-08-28 ENCOUNTER — Other Ambulatory Visit: Payer: Self-pay

## 2022-08-28 ENCOUNTER — Other Ambulatory Visit (HOSPITAL_COMMUNITY): Payer: Self-pay

## 2022-09-07 ENCOUNTER — Other Ambulatory Visit: Payer: Self-pay

## 2022-09-08 ENCOUNTER — Other Ambulatory Visit: Payer: Self-pay

## 2022-09-12 ENCOUNTER — Other Ambulatory Visit: Payer: Self-pay

## 2022-09-15 ENCOUNTER — Telehealth (HOSPITAL_COMMUNITY): Payer: Federal, State, Local not specified - Other | Admitting: Psychiatry

## 2022-09-18 ENCOUNTER — Other Ambulatory Visit: Payer: Self-pay | Admitting: Critical Care Medicine

## 2022-09-19 ENCOUNTER — Other Ambulatory Visit: Payer: Self-pay

## 2022-09-19 MED ORDER — PANTOPRAZOLE SODIUM 20 MG PO TBEC
20.0000 mg | DELAYED_RELEASE_TABLET | Freq: Every day | ORAL | 0 refills | Status: DC
  Filled 2022-09-19 – 2022-09-20 (×2): qty 30, 30d supply, fill #0

## 2022-09-20 ENCOUNTER — Other Ambulatory Visit: Payer: Self-pay | Admitting: Critical Care Medicine

## 2022-09-20 ENCOUNTER — Other Ambulatory Visit: Payer: Self-pay

## 2022-09-20 MED ORDER — VALSARTAN 80 MG PO TABS
80.0000 mg | ORAL_TABLET | Freq: Every day | ORAL | 0 refills | Status: DC
  Filled 2022-09-20 – 2022-09-29 (×2): qty 30, 30d supply, fill #0

## 2022-09-27 ENCOUNTER — Other Ambulatory Visit: Payer: Self-pay

## 2022-09-29 ENCOUNTER — Other Ambulatory Visit: Payer: Self-pay | Admitting: Critical Care Medicine

## 2022-09-29 ENCOUNTER — Other Ambulatory Visit: Payer: Self-pay

## 2022-09-29 DIAGNOSIS — E782 Mixed hyperlipidemia: Secondary | ICD-10-CM

## 2022-09-29 MED ORDER — ATORVASTATIN CALCIUM 20 MG PO TABS
20.0000 mg | ORAL_TABLET | Freq: Every day | ORAL | 2 refills | Status: DC
  Filled 2022-09-29: qty 30, 30d supply, fill #0
  Filled 2022-10-26 – 2022-10-27 (×2): qty 30, 30d supply, fill #1
  Filled 2022-11-27 – 2022-12-14 (×2): qty 30, 30d supply, fill #2

## 2022-09-29 NOTE — Telephone Encounter (Signed)
Requested medication (s) are due for refill today:yes  Requested medication (s) are on the active medication list: yes  Last refill:  08/24/22  Future visit scheduled: no  Notes to clinic:  Unable to refill per protocol, courtesy refill already given, routing for provider approval.      Requested Prescriptions  Pending Prescriptions Disp Refills   atorvastatin (LIPITOR) 20 MG tablet 30 tablet 0    Sig: Take 1 tablet (20 mg total) by mouth at bedtime.     Cardiovascular:  Antilipid - Statins Failed - 09/29/2022  6:09 AM      Failed - Lipid Panel in normal range within the last 12 months    Cholesterol, Total  Date Value Ref Range Status  05/16/2021 183 100 - 199 mg/dL Final   Cholesterol  Date Value Ref Range Status  07/20/2022 199 0 - 200 mg/dL Final   LDL Chol Calc (NIH)  Date Value Ref Range Status  05/16/2021 69 0 - 99 mg/dL Final   LDL Cholesterol  Date Value Ref Range Status  07/20/2022 118 (H) 0 - 99 mg/dL Final    Comment:           Total Cholesterol/HDL:CHD Risk Coronary Heart Disease Risk Table                     Men   Women  1/2 Average Risk   3.4   3.3  Average Risk       5.0   4.4  2 X Average Risk   9.6   7.1  3 X Average Risk  23.4   11.0        Use the calculated Patient Ratio above and the CHD Risk Table to determine the patient's CHD Risk.        ATP III CLASSIFICATION (LDL):  <100     mg/dL   Optimal  100-129  mg/dL   Near or Above                    Optimal  130-159  mg/dL   Borderline  160-189  mg/dL   High  >190     mg/dL   Very High Performed at [facility] [address] [place], [place] [address]    HDL  Date Value Ref Range Status  07/20/2022 58 >40 mg/dL Final  05/16/2021 94 >39 mg/dL Final   Triglycerides  Date Value Ref Range Status  07/20/2022 117 <150 mg/dL Final         Passed - Patient is not pregnant      Passed - Valid encounter within last 12 months    Recent Outpatient Visits            2 months ago Acute cystitis without hematuria   [place], MD   5 months ago Prediabetes   [place] Elsie Stain, MD   8 months ago Pap smear for cervical cancer screening   [place] Ladell Pier, MD   9 months ago Class 1 obesity due to excess calories without serious comorbidity with body mass index (BMI) of 32.0 to 32.9 in adult   [place], MD   11 months ago Essential hypertension   [place], Patrick E, MD

## 2022-10-03 ENCOUNTER — Other Ambulatory Visit: Payer: Self-pay

## 2022-10-04 ENCOUNTER — Other Ambulatory Visit: Payer: Self-pay

## 2022-10-12 ENCOUNTER — Other Ambulatory Visit: Payer: Self-pay

## 2022-10-16 ENCOUNTER — Other Ambulatory Visit: Payer: Self-pay

## 2022-10-20 ENCOUNTER — Other Ambulatory Visit: Payer: Self-pay | Admitting: Critical Care Medicine

## 2022-10-22 ENCOUNTER — Other Ambulatory Visit: Payer: Self-pay | Admitting: Critical Care Medicine

## 2022-10-23 NOTE — Telephone Encounter (Signed)
Requested medication (s) are due for refill today - yes  Requested medication (s) are on the active medication list -yes  Future visit scheduled -no  Last refill: 09/19/22 #30  Notes to clinic: Duplicate request- courtesy RF has already been given- no appointment scheduled.  Requested Prescriptions  Pending Prescriptions Disp Refills   pantoprazole (PROTONIX) 20 MG tablet 30 tablet 0    Sig: Take 1 tablet (20 mg total) by mouth daily.     Gastroenterology: Proton Pump Inhibitors Passed - 10/22/2022 11:14 AM      Passed - Valid encounter within last 12 months    Recent Outpatient Visits           2 months ago Acute cystitis without hematuria   [place], MD   6 months ago Prediabetes   [place] Elsie Stain, MD   9 months ago Pap smear for cervical cancer screening   [place] Karle Plumber B, MD   10 months ago Class 1 obesity due to excess calories without serious comorbidity with body mass index (BMI) of 32.0 to 32.9 in adult   [place] Elsie Stain, MD   12 months ago Essential hypertension   [place] Elsie Stain, MD                 Requested Prescriptions  Pending Prescriptions Disp Refills   pantoprazole (PROTONIX) 20 MG tablet 30 tablet 0    Sig: Take 1 tablet (20 mg total) by mouth daily.     Gastroenterology: Proton Pump Inhibitors Passed - 10/22/2022 11:14 AM      Passed - Valid encounter within last 12 months    Recent Outpatient Visits           2 months ago Acute cystitis without hematuria   [place], MD   6 months ago Prediabetes   [place] Elsie Stain, MD   9 months ago Pap smear for cervical cancer screening   [place] Karle Plumber B, MD   10 months ago Class 1 obesity due to excess calories without serious comorbidity with body mass index (BMI) of 32.0 to 32.9 in adult   [facility] Elsie Stain, MD   12 months ago Essential hypertension   [place] Elsie Stain, MD

## 2022-10-25 ENCOUNTER — Other Ambulatory Visit: Payer: Self-pay | Admitting: Critical Care Medicine

## 2022-10-26 ENCOUNTER — Other Ambulatory Visit: Payer: Self-pay | Admitting: Critical Care Medicine

## 2022-10-27 ENCOUNTER — Other Ambulatory Visit: Payer: Self-pay

## 2022-10-27 MED ORDER — PANTOPRAZOLE SODIUM 20 MG PO TBEC
20.0000 mg | DELAYED_RELEASE_TABLET | Freq: Every day | ORAL | 0 refills | Status: DC
  Filled 2022-10-27: qty 30, 30d supply, fill #0
  Filled 2022-11-21 (×2): qty 30, 30d supply, fill #1
  Filled 2022-12-14 – 2022-12-15 (×2): qty 30, 30d supply, fill #2

## 2022-10-27 MED ORDER — VALSARTAN 80 MG PO TABS
80.0000 mg | ORAL_TABLET | Freq: Every day | ORAL | 0 refills | Status: DC
  Filled 2022-10-27: qty 90, 90d supply, fill #0

## 2022-10-27 NOTE — Telephone Encounter (Signed)
Requested medications are due for refill today.  yes  Requested medications are on the active medications list.  yes  Last refill. 08/2022  Future visit scheduled.   no  Notes to clinic.  Per last refill, PT will need OV with PCP. PT called- lm.    Requested Prescriptions  Pending Prescriptions Disp Refills   pantoprazole (PROTONIX) 20 MG tablet 30 tablet 0    Sig: Take 1 tablet (20 mg total) by mouth daily.     Gastroenterology: Proton Pump Inhibitors Passed - 10/26/2022  6:05 PM      Passed - Valid encounter within last 12 months    Recent Outpatient Visits           2 months ago Acute cystitis without hematuria   [place], MD   6 months ago Prediabetes   [place] Elsie Stain, MD   9 months ago Pap smear for cervical cancer screening   [place] Karle Plumber B, MD   10 months ago Class 1 obesity due to excess calories without serious comorbidity with body mass index (BMI) of 32.0 to 32.9 in adult   [place] Elsie Stain, MD   1 year ago Essential hypertension   [place] Elsie Stain, MD               valsartan (DIOVAN) 80 MG tablet 30 tablet 0    Sig: Take 1 tablet (80 mg total) by mouth daily. Please make PCP (Dr. Joya Gaskins) visit for additional refills.     Cardiovascular:  Angiotensin Receptor Blockers Failed - 10/26/2022  6:05 PM      Failed - Last BP in normal range    BP Readings from Last 1 Encounters:  04/24/22 (!) 137/95         Passed - Cr in normal range and within 180 days    Creatinine, Ser  Date Value Ref Range Status  07/17/2022 0.85 0.44 - 1.00 mg/dL Final         Passed - K in normal range and within 180 days    Potassium  Date Value Ref Range Status  07/17/2022 4.5 3.5 - 5.1 mmol/L Final         Passed - Patient is not  pregnant      Passed - Valid encounter within last 6 months    Recent Outpatient Visits           2 months ago Acute cystitis without hematuria   [place] Ladell Pier, MD   6 months ago Prediabetes   [place] Elsie Stain, MD   9 months ago Pap smear for cervical cancer screening   [place] Karle Plumber B, MD   10 months ago Class 1 obesity due to excess calories without serious comorbidity with body mass index (BMI) of 32.0 to 32.9 in adult   [place] Elsie Stain, MD   1 year ago Essential hypertension   [place] Elsie Stain, MD

## 2022-10-27 NOTE — Telephone Encounter (Signed)
Called pt - left message on machine to call back for office visit.

## 2022-10-27 NOTE — Telephone Encounter (Signed)
Patient called an scheduled ov for next available on 4/16.

## 2022-10-27 NOTE — Telephone Encounter (Signed)
Requested Prescriptions  Pending Prescriptions Disp Refills   pantoprazole (PROTONIX) 20 MG tablet 90 tablet 0    Sig: Take 1 tablet (20 mg total) by mouth daily.     Gastroenterology: Proton Pump Inhibitors Passed - 10/27/2022  1:09 PM      Passed - Valid encounter within last 12 months    Recent Outpatient Visits           2 months ago Acute cystitis without hematuria   [place], MD   6 months ago Prediabetes   [place] Elsie Stain, MD   9 months ago Pap smear for cervical cancer screening   [place] Karle Plumber B, MD   10 months ago Class 1 obesity due to excess calories without serious comorbidity with body mass index (BMI) of 32.0 to 32.9 in adult   [facility] Elsie Stain, MD   1 year ago Essential hypertension   [place], MD       Future Appointments             In 2 months Elsie Stain, MD [place]             valsartan (DIOVAN) 80 MG tablet 90 tablet 0    Sig: Take 1 tablet (80 mg total) by mouth daily. Please make PCP (Dr. Joya Gaskins) visit for additional refills.     Cardiovascular:  Angiotensin Receptor Blockers Failed - 10/27/2022  1:09 PM      Failed - Last BP in normal range    BP Readings from Last 1 Encounters:  04/24/22 (!) 137/95         Passed - Cr in normal range and within 180 days    Creatinine, Ser  Date Value Ref Range Status  07/17/2022 0.85 0.44 - 1.00 mg/dL Final         Passed - K in normal range and within 180 days    Potassium  Date Value Ref Range Status  07/17/2022 4.5 3.5 - 5.1 mmol/L Final         Passed - Patient is not pregnant      Passed - Valid encounter within last 6 months    Recent Outpatient Visits           2 months ago Acute cystitis  without hematuria   [place], MD   6 months ago Prediabetes   [place] Elsie Stain, MD   9 months ago Pap smear for cervical cancer screening   [place] Karle Plumber B, MD   10 months ago Class 1 obesity due to excess calories without serious comorbidity with body mass index (BMI) of 32.0 to 32.9 in adult   [facility] Elsie Stain, MD   1 year ago Essential hypertension   [place], MD       Future Appointments             In 2 months Elsie Stain, MD [place]

## 2022-10-30 ENCOUNTER — Other Ambulatory Visit: Payer: Self-pay

## 2022-10-31 ENCOUNTER — Other Ambulatory Visit: Payer: Self-pay

## 2022-10-31 ENCOUNTER — Encounter (HOSPITAL_COMMUNITY): Payer: Self-pay | Admitting: Student in an Organized Health Care Education/Training Program

## 2022-10-31 ENCOUNTER — Telehealth (INDEPENDENT_AMBULATORY_CARE_PROVIDER_SITE_OTHER): Payer: Self-pay | Admitting: Student in an Organized Health Care Education/Training Program

## 2022-10-31 DIAGNOSIS — I10 Essential (primary) hypertension: Secondary | ICD-10-CM

## 2022-10-31 DIAGNOSIS — F411 Generalized anxiety disorder: Secondary | ICD-10-CM

## 2022-10-31 DIAGNOSIS — F3341 Major depressive disorder, recurrent, in partial remission: Secondary | ICD-10-CM

## 2022-10-31 MED ORDER — HYDROXYZINE HCL 50 MG PO TABS
50.0000 mg | ORAL_TABLET | Freq: Three times a day (TID) | ORAL | 3 refills | Status: DC | PRN
  Filled 2022-10-31 – 2022-11-07 (×2): qty 90, 30d supply, fill #0
  Filled 2022-11-22 – 2022-11-27 (×4): qty 90, 30d supply, fill #1
  Filled 2022-12-20 (×2): qty 90, 30d supply, fill #2

## 2022-10-31 MED ORDER — GABAPENTIN 600 MG PO TABS
600.0000 mg | ORAL_TABLET | Freq: Three times a day (TID) | ORAL | 3 refills | Status: DC
  Filled 2022-10-31 (×2): qty 90, 30d supply, fill #0
  Filled 2022-11-22: qty 90, 30d supply, fill #1
  Filled 2022-12-14 – 2022-12-15 (×3): qty 90, 30d supply, fill #2

## 2022-10-31 MED ORDER — DESVENLAFAXINE SUCCINATE ER 25 MG PO TB24
75.0000 mg | ORAL_TABLET | Freq: Every day | ORAL | 3 refills | Status: DC
  Filled 2022-11-22: qty 90, 30d supply, fill #0
  Filled 2022-12-15: qty 90, 30d supply, fill #1

## 2022-10-31 MED ORDER — MIRTAZAPINE 30 MG PO TBDP
30.0000 mg | ORAL_TABLET | Freq: Every day | ORAL | 3 refills | Status: DC
  Filled 2022-10-31: qty 30, 30d supply, fill #0
  Filled 2022-11-22: qty 30, 30d supply, fill #1
  Filled 2022-12-14: qty 30, 30d supply, fill #2

## 2022-10-31 MED ORDER — OLANZAPINE 2.5 MG PO TABS
2.5000 mg | ORAL_TABLET | Freq: Every day | ORAL | 3 refills | Status: DC
  Filled 2022-10-31: qty 30, 30d supply, fill #0
  Filled 2022-11-22: qty 30, 30d supply, fill #1
  Filled 2022-12-14 – 2022-12-15 (×3): qty 30, 30d supply, fill #2

## 2022-10-31 MED ORDER — CLONIDINE HCL 0.2 MG PO TABS
0.2000 mg | ORAL_TABLET | Freq: Every day | ORAL | 3 refills | Status: DC
  Filled 2022-10-31 – 2022-11-06 (×2): qty 30, 30d supply, fill #0
  Filled 2022-11-22 – 2022-11-27 (×4): qty 30, 30d supply, fill #1
  Filled 2022-12-20 (×2): qty 30, 30d supply, fill #2

## 2022-10-31 NOTE — Progress Notes (Signed)
BH MD/PA/NP OP Progress Note  10/31/2022 10:54 AM Margaret Mccann Margaret Mccann  MRN:  [id number]  Chief Complaint:  Chief Complaint  Patient presents with   Follow-up   Virtual Visit via Video Note  I connected with Margaret Mccann on 10/31/22 at  8:30 AM EST by a video enabled telemedicine application and verified that I am speaking with the correct person using two identifiers.  Location: Patient: Home Provider: Office   I discussed the limitations of evaluation and management by telemedicine and the availability of in person appointments. The patient expressed understanding and agreed to proceed.  History of Present Illness: Margaret Mccann "Margaret Mccann" Margaret Mccann is a 52 year old female with a PPH of MDD, GAD, PTSD, cocaine use, tobacco use, EtOH use disorder.  Patient reports she has been compliant with the following medication regimen:  Gabapentin 600 mg twice daily Remeron 30 mg nightly Pristiq 75 mg daily Zyprexa 2.5 mg nightly Hydroxyzine 50 mg 3 times daily Clonidine 0.2 mg nightly Propranolol 10 mg daily (prescribed by PCP)  Patient reports that she feels her depression is well controlled but she is having severe anxiety and some issues with sleep. Patient reports that she really thinks she needs to be on gabapentin 2400mg  and without it she feels more anxious and having sleep issues. Patient denies anhedonia, and reports that she likes to read, spend time with her dog, and take baths, and hang out with her children. Patient reports that she is eating less and thinks that this is due to her anxiety. Patient does not think she has lost 10lbs in the last 3 months. Patient denies feeling hopeless/worthless/ guilty. Patient denies SI, HI, passive, and AVH.  Patient reports that she thinks that her anxiety is causing her to seat constantly and feels jittery and uncomfortable. Patient denies feeling irritable. Patient deniees feeling akasthisa. She endorses that she has hyperarousal to loud  sounds. Patient reports that she is waking up and not getting 8h of sleep any longer. Pateitn denies having panic attacks. Patient reports that hse does not have nightmares as long as she taker Clonidine. Patient denies feeling conciously hypervigilant but endorses having a rush of adrenaline.   Patient reports that she works at a , she works part time. Patient reports that she lives with her mom and step dad since her fiance committed suicide in their home 2 years ago. Patient reports that she has since had a difficult time and can't live alone since then.   Kids: Son - in college and daughter senior in [employer] stays with dad   I discussed the assessment and treatment Mccann with the patient. The patient was provided an opportunity to ask questions and all were answered. The patient agreed with the Mccann and demonstrated an understanding of the instructions.   The patient was advised to call back or seek an in-person evaluation if the symptoms worsen or if the condition fails to improve as anticipated.  I provided 25 minutes of non-face-to-face time during this encounter.   [facility], MD  Visit Diagnosis:    ICD-10-CM   1. Primary hypertension  I10 cloNIDine (CATAPRES) 0.2 MG tablet    2. GAD (generalized anxiety disorder)  F41.1 desvenlafaxine (PRISTIQ) 25 MG 24 hr tablet    mirtazapine (REMERON SOL-TAB) 30 MG disintegrating tablet    gabapentin (NEURONTIN) 600 MG tablet    hydrOXYzine (ATARAX) 50 MG tablet    3. Major depressive disorder, recurrent episode, in partial remission with anxious distress (  HCC)  F33.41 desvenlafaxine (PRISTIQ) 25 MG 24 hr tablet    mirtazapine (REMERON SOL-TAB) 30 MG disintegrating tablet    gabapentin (NEURONTIN) 600 MG tablet    OLANZapine (ZYPREXA) 2.5 MG tablet    hydrOXYzine (ATARAX) 50 MG tablet      Past Psychiatric History:  INPT: 3x, multiple suicide attempts via overdose OPT: Yes Therapist: Previous, interested in  appt Geodon (tongue swell), Effexor, Prozac, Lamictal (SJS), Paxil, Cymbalta Likes Pristiq the best Hx of Klonopin Trauma history: -Found fianc after he hanged himself in 2022, she herself attempted suicide approximately 3 weeks later and was hospitalized -Witnessed her sister die of seizures after attempting suicide (eventually completing) via overdosing on Wellbutrin however, her sister endorse remorse but was not able to be revived Past Medical History:  Past Medical History:  Diagnosis Date   Alcohol withdrawal seizure with complication, with unspecified complication ([place]) 62/95/2841   Anxiety    Bipolar 1 disorder ([place])    Depression    Intentional drug overdose ([place]) 08/04/2020   Major depressive disorder, recurrent episode with anxious distress ([place]) 08/11/2020   Morgellons syndrome    MRSA (methicillin resistant Staphylococcus aureus)    Suicide attempt ([place])    Suicide by drug overdose ([place]) 08/11/2020   Tricuspid valve regurgitation 12/18/2020    Past Surgical History:  Procedure Laterality Date   APPENDECTOMY      Family Psychiatric History:   Sister - committed suicide in 2005 by overdosing on Wellbutrin   Family History:  Family History  Problem Relation Age of Onset   Heart attack Father 68   Stroke Father    Severe combined immunodeficiency Sister    Breast cancer Paternal Aunt    Ulcerative colitis Neg Hx    Esophageal cancer Neg Hx     Social History:  Social History   Socioeconomic History   Marital status: Single    Spouse name: Not on file   Number of children: Not on file   Years of education: Not on file   Highest education level: Not on file  Occupational History   Not on file  Tobacco Use   Smoking status: Some Days    Packs/day: 0.25    Years: 5.00    Total pack years: 1.25    Types: Cigarettes   Smokeless tobacco: Never  Vaping Use   Vaping Use: Never used  Substance and Sexual Activity   Alcohol use: Yes    Comment: occas    Drug use: Not Currently    Types: Cocaine    Comment: relapsed the past weekend, no cocaine use prior 6 months   Sexual activity: Not Currently    Birth control/protection: Pill  Other Topics Concern   Not on file  Social History Narrative   ** Merged History Encounter **       Social Determinants of Health   Financial Resource Strain: Not on file  Food Insecurity: No Food Insecurity (07/18/2022)   Hunger Vital Sign    Worried About Running Out of Food in the Last Year: Never true    Ran Out of Food in the Last Year: Never true  Transportation Needs: No Transportation Needs (07/18/2022)   PRAPARE - Hydrologist (Medical): No    Lack of Transportation (Non-Medical): No  Physical Activity: Not on file  Stress: Not on file  Social Connections: Not on file    Allergies:  Allergies  Allergen Reactions   Lamictal [Lamotrigine] Anaphylaxis, Rash and  Other (See Comments)    Stevens-Johnson syndrome and fevers, also   Lamictal [Lamotrigine] Anaphylaxis, Rash and Other (See Comments)    Fevers and Stevens-Johnson syndrome, also   Tramadol Other (See Comments)    Pt had a seizure after taking Tramadol!!   Geodon [Ziprasidone Hcl] Rash   Levetiracetam Anxiety    Metabolic Disorder Labs: Lab Results  Component Value Date   HGBA1C 5.7 (H) 07/20/2022   MPG 116.89 07/20/2022   MPG 114.02 12/19/2020   No results found for: "PROLACTIN" Lab Results  Component Value Date   CHOL 199 07/20/2022   TRIG 117 07/20/2022   HDL 58 07/20/2022   CHOLHDL 3.4 07/20/2022   VLDL 23 07/20/2022   LDLCALC 118 (H) 07/20/2022   LDLCALC 69 05/16/2021   Lab Results  Component Value Date   TSH 3.544 07/20/2022   TSH 1.300 05/16/2021    Therapeutic Level Labs: No results found for: "LITHIUM" No results found for: "VALPROATE" No results found for: "CBMZ"  Current Medications: Current Outpatient Medications  Medication Sig Dispense Refill   atorvastatin  (LIPITOR) 20 MG tablet Take 1 tablet (20 mg total) by mouth at bedtime. 30 tablet 2   cloNIDine (CATAPRES) 0.2 MG tablet Take 1 tablet (0.2 mg total) by mouth at bedtime. 30 tablet 3   desvenlafaxine (PRISTIQ) 25 MG 24 hr tablet Take 3 tablets (75 mg total) by mouth daily. 90 tablet 3   gabapentin (NEURONTIN) 600 MG tablet Take 1 tablet (600 mg total) by mouth 3 (three) times daily. 90 tablet 3   hydrOXYzine (ATARAX) 50 MG tablet Take 1 tablet (50 mg total) by mouth 3 (three) times daily as needed for anxiety. 90 tablet 3   melatonin 3 MG TABS tablet Take 1 tablet (3 mg total) by mouth at bedtime.  0   mirtazapine (REMERON SOL-TAB) 30 MG disintegrating tablet Take 1 tablet (30 mg total) by mouth at bedtime. 30 tablet 3   norethindrone (MICRONOR) 0.35 MG tablet Take 1 tablet (0.35 mg total) by mouth daily. 28 tablet 11   OLANZapine (ZYPREXA) 2.5 MG tablet Take 1 tablet (2.5 mg total) by mouth at bedtime. 30 tablet 3   pantoprazole (PROTONIX) 20 MG tablet Take 1 tablet (20 mg total) by mouth daily. 90 tablet 0   propranolol (INDERAL) 10 MG tablet Take 1 tablet (10 mg total) by mouth daily. 30 tablet 3   sulfamethoxazole-trimethoprim (BACTRIM DS) 800-160 MG tablet Take 1 tablet by mouth 2 (two) times daily. 14 tablet 0   valsartan (DIOVAN) 80 MG tablet Take 1 tablet (80 mg total) by mouth daily. Please make PCP (Dr. Delford Field) visit for additional refills. 90 tablet 0   No current facility-administered medications for this visit.     Musculoskeletal:  Psychiatric Specialty Exam: Review of Systems  Psychiatric/Behavioral:  Positive for sleep disturbance. Negative for dysphoric mood, hallucinations and suicidal ideas. The patient is nervous/anxious.     Last menstrual period 10/27/2019.There is no height or weight on file to calculate BMI.  General Appearance: Casual  Eye Contact:  Good  Speech:  Clear and Coherent  Volume:  Normal  Mood:  Anxious  Affect:  Congruent  Thought Process:  Linear   Orientation:  Full (Time, Place, and Person)  Thought Content: Logical   Suicidal Thoughts:  No  Homicidal Thoughts:  No  Memory:  Immediate;   Good Recent;   Good  Judgement:  Fair  Insight:  Shallow  Psychomotor Activity:  Restlessness  Concentration:  Concentration:  Fair  Recall:  Roel Cluck of Knowledge: Fair  Language: Good  Akathisia:  No  Handed:    AIMS (if indicated): not done  Assets:  Communication Skills Housing Resilience Social Support  ADL's:  Intact  Cognition: WNL  Sleep:  Poor   Screenings: AIMS    Flowsheet Row Admission (Discharged) from 06/04/2018 in [facility]  AIMS Total Score 0      AUDIT    Flowsheet Row Admission (Discharged) from 07/18/2022 in [facility] Admission (Discharged) from 08/11/2020 in [facility] [facility] Admission (Discharged) from 06/04/2018 in [facility]  Alcohol Use Disorder Identification Test Final Score (AUDIT) 6 11 0      GAD-7    Flowsheet Row Video Visit from 07/31/2022 in [facility] Video Visit from 06/15/2022 in [facility] Office Visit from 04/24/2022 in [place] [facility] from 01/23/2022 in [place]  Total GAD-7 Score 9 4 3 2 3       PHQ2-9    Flowsheet Row Video Visit from 07/31/2022 in [facility] Video Visit from 06/15/2022 in [facility] Office Visit from 04/24/2022 in [place] Video Visit from 03/23/2022 in [facility] Office Visit from 01/23/2022 in [place]  PHQ-2 Total Score 1 0 0 0 0  PHQ-9 Total Score 6 0 -- 1 6       Flowsheet Row Admission (Discharged) from 07/18/2022 in [place] [facility] ED from 01/12/2022 in [facility] Emergency Department at [place] Visit from 08/09/2021 in [place] No Risk No Risk No Risk        Assessment and Mccann:  Margaret Mccann is a 52 year old female with a PPH of MDD, GAD, PTSD, cocaine use, tobacco use, EtOH use disorder.  Patient linear thought process today, fixated on gabapentin.  Patient was persistent about getting her gabapentin back to 2400 mg as she was adamant that this was the reason why her anxiety was worsened.  Provider was willing to increase to 3 times daily dosing that may help with worsening anxiety however and decreased sleep, made it clear to patient that if she continues to have anxiety at next visit may need to address Pristiq instead.  Patient was understanding of this, we will reassess patient for mood and anxiety at next visit.  Patient's anxiety appears to be severe enough that a short acting medication adjustment would be more beneficial than long-acting.  Patient endorses that she is taking her hydroxyzine 50 mg 3 times daily currently due to her level of anxiety.  Patient did attempt to ask for "a low-dose benzodiazepine" however this request was denied and patient was understanding.  Patient was advised that if she felt that her anxiety was severe enough she needed to come to the urgent care, but she endorsed that she did not wish to do that at this time.  This provider will need to continue to see patient as there may be some personality traits contributing to patient either feeling out of control or fixating on medication adjustments.  Patient does have a notable trauma history and endorses feeling that she  was stable at 1 point in the past on a very similar medication regimen.  Patient does continue to endorse some PTSD related symptoms.  Chronic  PTSD MDD in partial remission GAD - Continue Pristiq 75 mg daily - Increase gabapentin to 600 mg 3 times daily - Continue hydroxyzine 50 mg 3 times daily - Continue Remeron 30 mg nightly - Continue Zyprexa 2.5 mg nightly - Continue clonidine 0.2 mg nightly  Primary hypertension - Propranolol 10 mg (prescribed by PCP)  Hx EtOH use disorder, severe - Gabapentin per above   Collaboration of Care: Collaboration of Care:   Patient/Guardian was advised Release of Information must be obtained prior to any record release in order to collaborate their care with an outside provider. Patient/Guardian was advised if they have not already done so to contact the registration department to sign all necessary forms in order for [place] to release information regarding their care.   Consent: Patient/Guardian gives verbal consent for treatment and assignment of benefits for services provided during this visit. Patient/Guardian expressed understanding and agreed to proceed.   PGY-3 Freida Busman, MD 10/31/2022, 10:54 AM

## 2022-11-04 ENCOUNTER — Other Ambulatory Visit: Payer: Self-pay | Admitting: Critical Care Medicine

## 2022-11-04 DIAGNOSIS — I10 Essential (primary) hypertension: Secondary | ICD-10-CM

## 2022-11-06 ENCOUNTER — Other Ambulatory Visit: Payer: Self-pay

## 2022-11-07 ENCOUNTER — Other Ambulatory Visit: Payer: Self-pay

## 2022-11-09 ENCOUNTER — Other Ambulatory Visit: Payer: Self-pay

## 2022-11-21 ENCOUNTER — Other Ambulatory Visit: Payer: Self-pay

## 2022-11-22 ENCOUNTER — Other Ambulatory Visit: Payer: Self-pay

## 2022-11-23 ENCOUNTER — Other Ambulatory Visit: Payer: Self-pay

## 2022-11-23 ENCOUNTER — Other Ambulatory Visit (HOSPITAL_COMMUNITY): Payer: Self-pay | Admitting: Psychiatry

## 2022-11-23 DIAGNOSIS — I10 Essential (primary) hypertension: Secondary | ICD-10-CM

## 2022-11-23 MED ORDER — PROPRANOLOL HCL 10 MG PO TABS
10.0000 mg | ORAL_TABLET | Freq: Every day | ORAL | 3 refills | Status: DC
  Filled 2022-11-23 – 2022-11-27 (×3): qty 30, 30d supply, fill #0
  Filled 2022-12-20 (×2): qty 30, 30d supply, fill #1
  Filled 2023-01-08: qty 30, 30d supply, fill #2
  Filled 2023-02-01 (×2): qty 30, 30d supply, fill #3

## 2022-11-27 ENCOUNTER — Other Ambulatory Visit: Payer: Self-pay

## 2022-11-28 ENCOUNTER — Encounter: Payer: Self-pay | Admitting: Internal Medicine

## 2022-11-28 ENCOUNTER — Other Ambulatory Visit: Payer: Self-pay

## 2022-12-01 ENCOUNTER — Other Ambulatory Visit: Payer: Self-pay

## 2022-12-04 ENCOUNTER — Other Ambulatory Visit: Payer: Self-pay

## 2022-12-14 ENCOUNTER — Other Ambulatory Visit: Payer: Self-pay

## 2022-12-15 ENCOUNTER — Other Ambulatory Visit: Payer: Self-pay

## 2022-12-20 ENCOUNTER — Other Ambulatory Visit: Payer: Self-pay

## 2022-12-29 ENCOUNTER — Telehealth (HOSPITAL_COMMUNITY): Payer: No Payment, Other | Admitting: Student in an Organized Health Care Education/Training Program

## 2022-12-29 ENCOUNTER — Encounter (HOSPITAL_COMMUNITY): Payer: Self-pay

## 2023-01-03 ENCOUNTER — Telehealth (INDEPENDENT_AMBULATORY_CARE_PROVIDER_SITE_OTHER): Payer: Self-pay | Admitting: Student in an Organized Health Care Education/Training Program

## 2023-01-03 ENCOUNTER — Encounter (HOSPITAL_COMMUNITY): Payer: Self-pay | Admitting: Student in an Organized Health Care Education/Training Program

## 2023-01-03 ENCOUNTER — Other Ambulatory Visit: Payer: Self-pay

## 2023-01-03 DIAGNOSIS — F411 Generalized anxiety disorder: Secondary | ICD-10-CM

## 2023-01-03 DIAGNOSIS — F3341 Major depressive disorder, recurrent, in partial remission: Secondary | ICD-10-CM

## 2023-01-03 DIAGNOSIS — I10 Essential (primary) hypertension: Secondary | ICD-10-CM

## 2023-01-03 MED ORDER — CLONIDINE HCL 0.2 MG PO TABS
0.2000 mg | ORAL_TABLET | Freq: Every day | ORAL | 3 refills | Status: DC
  Filled 2023-01-03 – 2023-01-08 (×2): qty 30, 30d supply, fill #0
  Filled 2023-02-01 (×2): qty 30, 30d supply, fill #1
  Filled 2023-02-26: qty 30, 30d supply, fill #2

## 2023-01-03 MED ORDER — MIRTAZAPINE 30 MG PO TBDP
30.0000 mg | ORAL_TABLET | Freq: Every day | ORAL | 3 refills | Status: DC
  Filled 2023-01-03 – 2023-01-08 (×2): qty 30, 30d supply, fill #0
  Filled 2023-02-01: qty 30, 30d supply, fill #1
  Filled 2023-03-06 – 2023-03-19 (×4): qty 30, 30d supply, fill #2

## 2023-01-03 MED ORDER — OLANZAPINE 2.5 MG PO TABS
2.5000 mg | ORAL_TABLET | Freq: Every day | ORAL | 3 refills | Status: DC
  Filled 2023-01-03 – 2023-01-08 (×2): qty 30, 30d supply, fill #0
  Filled 2023-02-02: qty 30, 30d supply, fill #1
  Filled 2023-02-26: qty 30, 30d supply, fill #2
  Filled 2023-04-01: qty 30, 30d supply, fill #3

## 2023-01-03 MED ORDER — HYDROXYZINE HCL 50 MG PO TABS
50.0000 mg | ORAL_TABLET | Freq: Three times a day (TID) | ORAL | 3 refills | Status: DC | PRN
  Filled 2023-01-03 – 2023-01-08 (×2): qty 90, 30d supply, fill #0
  Filled 2023-02-01 (×2): qty 90, 30d supply, fill #1
  Filled 2023-02-26: qty 90, 30d supply, fill #2

## 2023-01-03 MED ORDER — GABAPENTIN 600 MG PO TABS
600.0000 mg | ORAL_TABLET | Freq: Three times a day (TID) | ORAL | 3 refills | Status: DC
  Filled 2023-01-03 – 2023-01-08 (×3): qty 90, 30d supply, fill #0
  Filled 2023-01-08: qty 270, 90d supply, fill #0
  Filled 2023-03-18: qty 90, 30d supply, fill #1

## 2023-01-03 MED ORDER — DESVENLAFAXINE SUCCINATE ER 25 MG PO TB24
75.0000 mg | ORAL_TABLET | Freq: Every day | ORAL | 3 refills | Status: DC
  Filled 2023-01-03 – 2023-01-14 (×2): qty 90, 30d supply, fill #0
  Filled 2023-02-15 – 2023-02-16 (×2): qty 90, 30d supply, fill #1
  Filled 2023-03-17 – 2023-03-19 (×2): qty 90, 30d supply, fill #2

## 2023-01-03 NOTE — Progress Notes (Signed)
BH MD/PA/NP OP Progress Note  01/03/2023 9:27 AM Margaret Mccann Margaret Mccann  MRN:  [id number]  Chief Complaint: No chief complaint on file.  Virtual Visit via Video Note  I connected with Margaret Mccann Margaret Mccann on 01/03/23 at  9:30 AM EDT by a video enabled telemedicine application and verified that I am speaking with the correct person using two identifiers.  Location: Patient: Home Provider: Office   I discussed the limitations of evaluation and management by telemedicine and the availability of in person appointments. The patient expressed understanding and agreed to proceed.  History of Present Illness:  Margaret Mccann is a 52 year old female with a PPH of MDD, GAD, PTSD, cocaine use, tobacco use, EtOH use disorder.  Patient reports she has the following medication regimen:   Gabapentin 600 mg TID Remeron 30 mg nightly Pristiq 75 mg daily Zyprexa 2.5 mg nightly Hydroxyzine 50 mg 3 times daily Clonidine 0.2 mg nightly Propranolol 10 mg daily (prescribed by PCP)   Patient reports that she is doing "ok." Patient endorses that she is compliant with her medications. Patient reports that her anxiety is much better since the gabapentin was increased. She is not really feeling any signs of depression.  She has a job interview this afternoon. She reports that she has had a few panic attacks, but she thinks that these may be due to a friend passing away from an overdose. She endorses that she talks to people in her AA program and her mother as a way of support. She reports that her appetite is normal. She reports that her sleep is better. She endorses a few nights of difficulty since her friends death. She denies SI, HI, and AVH. She does not believe she is having any adverse side effects from her medication currently.   Patient endorses that she is sober from [place] and does not use THC or illicit substances.   Treatment plan with the patient. The patient was provided an opportunity  to ask questions and all were answered. The patient agreed with the plan and demonstrated an understanding of the instructions.   The patient was advised to call back or seek an in-person evaluation if the symptoms worsen or if the condition fails to improve as anticipated.  I provided 15 minutes of non-face-to-face time during this encounter.   [name], MD  Visit Diagnosis: No diagnosis found.  Past Psychiatric History:   INPT: 3x, multiple suicide attempts via overdose OPT: Yes Therapist: Previous, interested in appt Geodon (tongue swell), Effexor, Prozac, Lamictal (SJS), Paxil, Cymbalta Likes Pristiq the best Hx of Klonopin  Last visit 10/2022-patient fixated on increasing her gabapentin and having the exact same medication regimen that [place] years ago [place]  Was not willing to increase patient to 2400 mg gabapentin but did increase to 1800 mg.  Trauma history: -Found fianc after he hanged himself in 2022, she herself attempted suicide approximately 3 weeks later and was hospitalized -Witnessed her sister die of seizures after attempting suicide (eventually completing) via overdosing on Wellbutrin however, her sister endorse remorse but was not able to be revived  Past Medical History:  Past Medical History:  Diagnosis Date   Alcohol withdrawal seizure with complication, with unspecified complication (HCC) 12/23/2020   Anxiety    Bipolar 1 disorder (HCC)    Depression    Intentional drug overdose (HCC) 08/04/2020   Major depressive disorder, recurrent episode with anxious distress (HCC) 08/11/2020   Morgellons  syndrome    MRSA (methicillin resistant Staphylococcus aureus)    Suicide attempt [facility])    Suicide by drug overdose (HCC) 08/11/2020   Tricuspid valve regurgitation 12/18/2020    Past Surgical History:  Procedure Laterality Date   APPENDECTOMY      Family Psychiatric History:  Sister - committed suicide in 2005 by overdosing on  Wellbutrin   Family History:  Family History  Problem Relation Age of Onset   Heart attack Father 63   Stroke Father    Severe combined immunodeficiency Sister    Breast cancer Paternal Aunt    Ulcerative colitis Neg Hx    Esophageal cancer Neg Hx     Social History:  Social History   Socioeconomic History   Marital status: Single    Spouse name: Not on file   Number of children: Not on file   Years of education: Not on file   Highest education level: Not on file  Occupational History   Not on file  Tobacco Use   Smoking status: Some Days    Packs/day: 0.25    Years: 5.00    Additional pack years: 0.00    Total pack years: 1.25    Types: Cigarettes   Smokeless tobacco: Never  Vaping Use   Vaping Use: Never used  Substance and Sexual Activity   Alcohol use: Yes    Comment: occas   Drug use: Not Currently    Types: Cocaine    Comment: relapsed the past weekend, no cocaine use prior 6 months   Sexual activity: Not Currently    Birth control/protection: Pill  Other Topics Concern   Not on file  Social History Narrative   ** Merged History Encounter **       Social Determinants of Health   Financial Resource Strain: Not on file  Food Insecurity: No Food Insecurity (07/18/2022)   Hunger Vital Sign    Worried About Running Out of Food in the Last Year: Never true    Ran Out of Food in the Last Year: Never true  Transportation Needs: No Transportation Needs (07/18/2022)   PRAPARE - Administrator, Civil Service (Medical): No    Lack of Transportation (Non-Medical): No  Physical Activity: Not on file  Stress: Not on file  Social Connections: Not on file    Allergies:  Allergies  Allergen Reactions   Lamictal [Lamotrigine] Anaphylaxis, Rash and Other (See Comments)    Stevens-Johnson syndrome and fevers, also   Lamictal [Lamotrigine] Anaphylaxis, Rash and Other (See Comments)    Fevers and Stevens-Johnson syndrome, also   Tramadol Other (See  Comments)    Pt had a seizure after taking Tramadol!!   Geodon [Ziprasidone Hcl] Rash   Levetiracetam Anxiety    Metabolic Disorder Labs: Lab Results  Component Value Date   HGBA1C 5.7 (H) 07/20/2022   MPG 116.89 07/20/2022   MPG 114.02 12/19/2020   No results found for: "PROLACTIN" Lab Results  Component Value Date   CHOL 199 07/20/2022   TRIG 117 07/20/2022   HDL 58 07/20/2022   CHOLHDL 3.4 07/20/2022   VLDL 23 07/20/2022   LDLCALC 118 (H) 07/20/2022   LDLCALC 69 05/16/2021   Lab Results  Component Value Date   TSH 3.544 07/20/2022   TSH 1.300 05/16/2021    Therapeutic Level Labs: No results found for: "LITHIUM" No results found for: "VALPROATE" No results found for: "CBMZ"  Current Medications: Current Outpatient Medications  Medication Sig Dispense Refill  atorvastatin (LIPITOR) 20 MG tablet Take 1 tablet (20 mg total) by mouth at bedtime. 30 tablet 2   cloNIDine (CATAPRES) 0.2 MG tablet Take 1 tablet (0.2 mg total) by mouth at bedtime. 30 tablet 3   desvenlafaxine (PRISTIQ) 25 MG 24 hr tablet Take 3 tablets (75 mg total) by mouth daily. 90 tablet 3   gabapentin (NEURONTIN) 600 MG tablet Take 1 tablet (600 mg total) by mouth 3 (three) times daily. 90 tablet 3   hydrOXYzine (ATARAX) 50 MG tablet Take 1 tablet (50 mg total) by mouth 3 (three) times daily as needed for anxiety. 90 tablet 3   melatonin 3 MG TABS tablet Take 1 tablet (3 mg total) by mouth at bedtime.  0   mirtazapine (REMERON SOL-TAB) 30 MG disintegrating tablet Take 1 tablet (30 mg total) by mouth at bedtime. 30 tablet 3   norethindrone (MICRONOR) 0.35 MG tablet Take 1 tablet (0.35 mg total) by mouth daily. 28 tablet 11   OLANZapine (ZYPREXA) 2.5 MG tablet Take 1 tablet (2.5 mg total) by mouth at bedtime. 30 tablet 3   pantoprazole (PROTONIX) 20 MG tablet Take 1 tablet (20 mg total) by mouth daily. 90 tablet 0   propranolol (INDERAL) 10 MG tablet Take 1 tablet (10 mg total) by mouth daily. 30 tablet 3    sulfamethoxazole-trimethoprim (BACTRIM DS) 800-160 MG tablet Take 1 tablet by mouth 2 (two) times daily. 14 tablet 0   valsartan (DIOVAN) 80 MG tablet Take 1 tablet (80 mg total) by mouth daily. Please make PCP (Dr. Delford Field) visit for additional refills. 90 tablet 0   No current facility-administered medications for this visit.      Psychiatric Specialty Exam: Review of Systems  Last menstrual period 10/27/2019.There is no height or weight on file to calculate BMI.  General Appearance: Casual  Eye Contact:  Good  Speech:  Clear and Coherent  Volume:  Normal  Mood:  Euthymic  Affect:  Appropriate  Thought Process:  Coherent  Orientation:  Full (Time, Place, and Person)  Thought Content: Logical   Suicidal Thoughts:  No  Homicidal Thoughts:  No  Memory:  Immediate;   Good Recent;   Good  Judgement:  Good  Insight:  Good  Psychomotor Activity:  NA  Concentration:  Concentration: Good  Recall:  Good  Fund of Knowledge: Good  Language: Good  Akathisia:  NA  Handed:    AIMS (if indicated): not done  Assets:  Communication Skills Desire for Improvement Housing Resilience Social Support  ADL's:  Intact  Cognition: WNL  Sleep:  Good   Screenings: AIMS    Flowsheet Row [facility]  AIMS Total Score 0      AUDIT    Flowsheet Row [facility] Ad[facility] ([facility] Ad[facility] ([facility]) [facility]  Alcohol Use Disorder Identification Test Final Score (AUDIT) 6 11 0      GAD-7    Flowsheet Row Video Visit from 07/31/2022 in [facility] Video Visit from 06/15/2022 in [facility] Office Visit from 04/24/2022 in [facility] Video Visit from 03/23/2022 in [facility] Office Visit from 01/23/2022 in [facility]  Total GAD-7 Score 9 4 3 2  3  [id number]    Flowsheet Row Video Visit from 07/31/2022 in [facility] Video Visit from 06/15/2022 in [facility] Office Visit from 04/24/2022 in [facility] Video Visit from 03/23/2022 in [facility] Office Visit from 01/23/2022 in [facility]  PHQ-2 Total Score 1 0 0 0 0  PHQ-9 Total Score 6 0 -- 1 6      Flowsheet Row [facility]) [facility] ED from 01/12/2022 in [facility] Video Visit from 08/09/2021 in [facility]  C-SSRS RISK CATEGORY No Risk No Risk No Risk        Assessment and Plan:  Based on assessment today patient appears to be fairly stable with gabapentin at current dose.  Patient was not as preoccupied with medication today.  Patient did recall conversations at last visit about possible future medication adjustments however she responded well to provider endorsing no need for medication adjustment since she is fairly stable.  Patient endorsed good insight and does not appear to be decompensating despite recent loss of friend to substance use disorder.  Patient is using her coping skills and support system appropriately.  Patient appears to be overall functional in her day-to-day living and has a job interview.  Discussed with patient pending transition of care to a new resident starting July 1st, and likely discontinuation of care by this provider at that time.     EKG: Qtc 448 with HR of 53 in 06/2022  Chronic PTSD MDD in partial remission GAD - Continue Pristiq 75 mg daily - Continue gabapentin to 600 mg  3 times daily - Continue hydroxyzine 50 mg 3 times daily - Continue Remeron 30 mg nightly - Continue Zyprexa 2.5 mg nightly - Continue clonidine 0.2 mg nightly   Primary hypertension - Propranolol 10 mg (prescribed by PCP)   Hx EtOH use disorder, severe - Gabapentin per above   Collaboration of Care: Collaboration of Care:   Patient/Guardian was advised Release of Information must be obtained prior to any record release in order to collaborate their care with an outside provider. Patient/Guardian was advised if they have not already done so to contact the registration department to sign all necessary forms in order for [place] to release information regarding their care.   Consent: Patient/Guardian gives verbal consent for treatment and assignment of benefits for services provided during this visit. Patient/Guardian expressed understanding and agreed to proceed.   PGY-3 Bobbye Morton, MD 01/03/2023, 9:27 AM

## 2023-01-07 ENCOUNTER — Other Ambulatory Visit: Payer: Self-pay | Admitting: Critical Care Medicine

## 2023-01-07 DIAGNOSIS — E782 Mixed hyperlipidemia: Secondary | ICD-10-CM

## 2023-01-07 MED ORDER — ATORVASTATIN CALCIUM 20 MG PO TABS
20.0000 mg | ORAL_TABLET | Freq: Every day | ORAL | 1 refills | Status: DC
Start: 2023-01-07 — End: 2023-01-09
  Filled 2023-01-07: qty 30, 30d supply, fill #0

## 2023-01-08 ENCOUNTER — Telehealth (HOSPITAL_COMMUNITY): Payer: Self-pay

## 2023-01-08 ENCOUNTER — Other Ambulatory Visit (HOSPITAL_COMMUNITY): Payer: Self-pay | Admitting: Student in an Organized Health Care Education/Training Program

## 2023-01-08 ENCOUNTER — Other Ambulatory Visit (HOSPITAL_COMMUNITY): Payer: Self-pay

## 2023-01-08 ENCOUNTER — Other Ambulatory Visit: Payer: Self-pay

## 2023-01-08 DIAGNOSIS — F411 Generalized anxiety disorder: Secondary | ICD-10-CM

## 2023-01-08 DIAGNOSIS — F3341 Major depressive disorder, recurrent, in partial remission: Secondary | ICD-10-CM

## 2023-01-08 NOTE — Progress Notes (Signed)
Saw message from April Procita RPH at [facility], asking MD to call so that patient can fill rx early to travel out [place] the [place]. Provider called allowing for the medication to be filled early for patient's travel.    PGY-3 Eliseo Gum

## 2023-01-08 NOTE — Telephone Encounter (Signed)
Ok, saw a message saying I could just call the pharmacy so they could release it appropriately for her travel, I did this.

## 2023-01-08 NOTE — Progress Notes (Signed)
Established Patient Office Visit  Subjective:  Patient ID: Margaret Mccann, female    DOB: Oct 15, 1970  Age: 52 y.o. MRN: [id number]  CC: primary care follow up , friend died of OD anxiety  HPI  05-24-2021   Referred for HTN mgmt and PCP to est by [facility] [name] This patient has a history of PTSD substance use prior suicide attempts, major depression and severe anxiety.  The patient was hospitalized in March with hypoxic respiratory failure and polysubstance overdose.  Patient then went to rehab for a 91-month interval.  Since that time she is in a better mental health state.  Patient is referred by the [facility] for primary care to establish.  She has not had a primary care provider in recent times.  She states that clonidine helps her PTSD and is requesting refills on this and propranolol.  Today on arrival blood pressure is 119/58 pulse 76 and she is already on the clonidine 0.2 mg daily.  She was also given a short-term refill on Zyprexa which she takes at bedtime to help with sleep as needed.  She is requesting refills on this as well.   Note during the March hospitalization the patient had an echocardiogram that showed significant tricuspid regurgitation but normal right heart pressures  Patient does have a gynecologist did have a Pap smear over 3 years ago and is now becoming due.  Patient does need hepatitis C screening.  Note patient's not actively suicidal at this time.  She does have reflux symptoms on a daily basis.  She is on over-the-counter Nexium is requesting a prescription for Protonix.  Patient is due a mammogram.  Patient's mental health stressors are that her fianc hung himself in their home last fall.  Also her sister committed suicide in October.  This resulted in significant issues for this patient.  10/24/21 Since the last visit the patient unfortunately has lost her job and cannot afford to pay for her medications.  She does not have any insurance.  She  cannot afford any of the [employer].  On arrival blood pressure 111/76 she is doing well on the clonidine twice daily.  Her PHQ-9 and GAD-7 are mildly elevated.  She is not suicidal.  She tried to go see cardiology but could not make the appointment because her boss would not let her off work for this visit.  Also she was unable to attend a mammogram appointment twice because of her job.  She also needs colon cancer screening.  Note on arrival she did receive the flu vaccine  The patient has no other complaints  3/27 Patient returns in follow-up and complains of bilateral knee pain and progressive weight gain.  She normally weighs in the 120 range now she is 182 with a BMI of 32.  She had been seen by cardiology had a negative coronary CT and echocardiogram was unremarkable  The patient has been seen by cardiology assessment is as below Tricuspid valve insufficiency, unspecified etiology   Essential hypertension   Mixed hyperlipidemia   Chest pain of uncertain etiology   Dyspnea, unspecified type       PLAN:     In order of problems listed above:   1.  I reviewed her echocardiogram from 2022 which does demonstrate good deal of tricuspid regurgitation.  I will repeat an echocardiogram now.  We may need to perform a right heart catheterization depending on these results.  Follow-up in 1 year or earlier  if needed 2.  Blood pressure is at goal today. 3.  This is being followed by her primary care provider. 4.  We will obtain coronary CTA.  This would also inform the need for aspirin. 5.  We will obtain echocardiogram to evaluate.  I have a feeling this is due to her 60 pound weight gain.   Coronary CT was normal and echocardiogram was normal blood pressure remains at goal on clonidine.  Mental health is stable.  She does have bilateral knee pain.  She does not eat breakfast she takes protein shakes with nuts for lunch she eats salads chicken breast sweet potatoes for dinner  she is not getting much fresh fruits or vegetables in her diet.  She does walk daily for about an hour with her dog which is a lab or doodle.  She would like diabetes screening at this visit.  She complains of fractured left molar in the left lower jaw.  She would like a dental exam.  She now has the orange card and blue card.  She still has reflux if she overeats at night late.  She has not had a period in 1 year and she is sleeping better since she is in recovery.  She is questioning whether she could obtain progesterone for menopause.     Recent mammogram was normal.  She has a Pap smear appointment upcoming.  There are no other primary care gaps.  7/31 Patient seen today in return follow-up she has had continued weight gain she is up to 183 pounds at a BMI of 32-1/2.  Blood pressure on arrival elevated 137/95.  It is elevated on recheck.  The patient is trying to eat a lifestyle medicine diet but is still eating quite a bit of carbohydrates and processed foods with the diet.  She is still smoking 3 cigarettes daily.  She does have prediabetes with an A1c of 6.1 blood sugar 168.  The patient does not have any other complaints at this time.  Her mental health is improved.  She does need refills on multiple medications.  The patient does maintain for hypertension control clonidine 0.2 mg daily and propranolol 10 mg daily she is taking the Micronor and is tolerating this well with decreased excess menstruations.  Mental health is following and managing all her mental health medications which include the hydroxyzine mirtazapine and olanzapine and gabapentin along with Pristiq.  01/09/23 Patient seen and return and unfortunately her best friend died of an overdose recently and she has been in a great deal of distress since that time.  She does see mental health and just saw her recently below is documentation from that visit.  She does need medication refills.  Blood pressure is good 98/61 on arrival. The  patient needs a colon exam and also needs a Pap smear and a mammogram.  Patient does complain of reflux it is worsening. 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 Attending AA meetings and showing improvements in overall psychosocial functioning. No changes to plan of care at this time.   I reviewed the patient's chart and discussed the patient and plan of care with the resident. I agree with the findings and plan as documented  in the resident's note and above addendum.    Daine Gip, MD 01/06/23          Expand All Collapse All BH MD/PA/NP OP Progress Note   01/03/2023 9:27 AM Jackolyn Confer Honest Anes  MRN:  [id number]   Chief Complaint: No chief complaint on file.   Virtual Visit via Video Note   I connected with Jackolyn Confer [name] on 01/03/23 at  9:30 AM EDT by a video enabled telemedicine application and verified that I am speaking with the correct person using two identifiers.   Location: Patient: Home Provider: Office   I discussed the limitations of evaluation and management by telemedicine and the availability of in person appointments. The patient expressed understanding and agreed to proceed.   History of Present Illness:   Keiyanna "Higinio Plan" Derrill Memo is a 52 year old female with a PPH of MDD, GAD, PTSD, cocaine use, tobacco use, EtOH use disorder.  Patient reports she has the following medication regimen:   Gabapentin 600 mg TID Remeron 30 mg nightly Pristiq 75 mg daily Zyprexa 2.5 mg nightly Hydroxyzine 50 mg 3 times daily Clonidine 0.2 mg nightly Propranolol 10 mg daily (prescribed by PCP)   Patient reports that she is doing "ok." Patient endorses that she is compliant with her medications. Patient  reports that her anxiety is much better since the gabapentin was increased. She is not really feeling any signs of depression.  She has a job interview this afternoon. She reports that she has had a few panic attacks, but she thinks that these may be due to a friend passing away from an overdose. She endorses that she talks to people in her AA program and her mother as a way of support. She reports that her appetite is normal. She reports that her sleep is better. She endorses a few nights of difficulty since her friends death. She denies SI, HI, and AVH. She does not believe she is having any adverse side effects from her medication currently.    Patient endorses that she is sober from [place] and does not use THC or illicit substances.    Treatment plan with the patient. The patient was provided an opportunity to ask questions and all were answered. The patient agreed with the plan and demonstrated an understanding of the instructions.   The patient was advised to call back or seek an in-person evaluation if the symptoms worsen or if the condition fails to improve as anticipated.   I provided 15 minutes of non-face-to-face time during this encounter.     Bobbye Morton, MD  Visit Diagnosis: No diagnosis found.   Past Psychiatric History:    INPT: 3x, multiple suicide attempts via overdose OPT: Yes Therapist: Previous, interested in appt Geodon (tongue swell), Effexor, Prozac, Lamictal (SJS), Paxil, Cymbalta Likes Pristiq the best Hx of Klonopin   Last visit 10/2022-patient fixated on increasing her gabapentin and having the exact same medication regimen that she had a few years ago when she was in [place].  Was not willing to increase patient to 2400 mg gabapentin but did increase to 1800 mg.   Trauma history: -Found fianc after he hanged himself in 2022, she herself attempted suicide approximately 3 weeks later and was hospitalized -Witnessed her sister die of seizures after attempting  suicide (eventually completing) via overdosing on Wellbutrin however, her sister endorse remorse but was not able to be revived  Assessment and Plan:  Based on assessment today patient appears to be fairly stable  with gabapentin at current dose.  Patient was not as preoccupied with medication today.  Patient did recall conversations at last visit about possible future medication adjustments however she responded well to provider endorsing no need for medication adjustment since she is fairly stable.  Patient endorsed good insight and does not appear to be decompensating despite recent loss of friend to substance use disorder.  Patient is using her coping skills and support system appropriately.  Patient appears to be overall functional in her day-to-day living and has a job interview.   Discussed with patient pending transition of care to a new resident starting July 1st, and likely discontinuation of care by this provider at that time.       EKG: Qtc 448 with HR of 53 in 06/2022   Chronic PTSD MDD in partial remission GAD - Continue Pristiq 75 mg daily - Continue gabapentin to 600 mg 3 times daily - Continue hydroxyzine 50 mg 3 times daily - Continue Remeron 30 mg nightly - Continue Zyprexa 2.5 mg nightly - Continue clonidine 0.2 mg nightly   Primary hypertension - Propranolol 10 mg (prescribed by PCP)   Hx EtOH use disorder, severe - Gabapentin per above          Past Medical History:  Diagnosis Date   Alcohol withdrawal seizure with complication, with unspecified complication 12/23/2020   Anxiety    Bipolar 1 disorder    Depression    Intentional drug overdose 08/04/2020   Major depressive disorder, recurrent episode with anxious distress 08/11/2020   Morgellons syndrome    MRSA (methicillin resistant Staphylococcus aureus)    Suicide attempt    Suicide by drug overdose 08/11/2020   Tricuspid valve regurgitation 12/18/2020    Past Surgical History:  Procedure  Laterality Date   APPENDECTOMY      Family History  Problem Relation Age of Onset   Heart attack Father 8   Stroke Father    Severe combined immunodeficiency Sister    Breast cancer Paternal Aunt    Ulcerative colitis Neg Hx    Esophageal cancer Neg Hx     Social History   Socioeconomic History   Marital status: Single    Spouse name: Not on file   Number of children: Not on file   Years of education: Not on file   Highest education level: Not on file  Occupational History   Not on file  Tobacco Use   Smoking status: Some Days    Packs/day: 0.25    Years: 5.00    Additional pack years: 0.00    Total pack years: 1.25    Types: Cigarettes   Smokeless tobacco: Never  Vaping Use   Vaping Use: Never used  Substance and Sexual Activity   Alcohol use: Yes    Comment: occas   Drug use: Not Currently    Types: Cocaine    Comment: relapsed the past weekend, no cocaine use prior 6 months   Sexual activity: Not Currently    Birth control/protection: Pill  Other Topics Concern   Not on file  Social History Narrative   ** Merged History Encounter **       Social Determinants of Health   Financial Resource Strain: Not on file  Food Insecurity: No Food Insecurity (07/18/2022)   Hunger Vital Sign    Worried About Running Out of Food in the Last Year: Never true    Ran Out of Food in the Last Year: Never true  Transportation Needs: No  Transportation Needs (07/18/2022)   PRAPARE - Administrator, Civil Service (Medical): No    Lack of Transportation (Non-Medical): No  Physical Activity: Not on file  Stress: Not on file  Social Connections: Not on file  Intimate Partner Violence: Not At Risk (07/18/2022)   Humiliation, Afraid, Rape, and Kick questionnaire    Fear of Current or Ex-Partner: No    Emotionally Abused: No    Physically Abused: No    Sexually Abused: No    Outpatient Medications Prior to Visit  Medication Sig Dispense Refill   cloNIDine  (CATAPRES) 0.2 MG tablet Take 1 tablet (0.2 mg total) by mouth at bedtime. 30 tablet 3   desvenlafaxine (PRISTIQ) 25 MG 24 hr tablet Take 3 tablets (75 mg total) by mouth daily. 90 tablet 3   gabapentin (NEURONTIN) 600 MG tablet Take 1 tablet (600 mg total) by mouth 3 (three) times daily. 90 tablet 3   hydrOXYzine (ATARAX) 50 MG tablet Take 1 tablet (50 mg total) by mouth 3 (three) times daily as needed for anxiety. 90 tablet 3   melatonin 3 MG TABS tablet Take 1 tablet (3 mg total) by mouth at bedtime.  0   mirtazapine (REMERON SOL-TAB) 30 MG disintegrating tablet Take 1 tablet (30 mg total) by mouth at bedtime. 30 tablet 3   OLANZapine (ZYPREXA) 2.5 MG tablet Take 1 tablet (2.5 mg total) by mouth at bedtime. 30 tablet 3   propranolol (INDERAL) 10 MG tablet Take 1 tablet (10 mg total) by mouth daily. 30 tablet 3   atorvastatin (LIPITOR) 20 MG tablet Take 1 tablet (20 mg total) by mouth at bedtime. 30 tablet 1   norethindrone (MICRONOR) 0.35 MG tablet Take 1 tablet (0.35 mg total) by mouth daily. 28 tablet 11   pantoprazole (PROTONIX) 20 MG tablet Take 1 tablet (20 mg total) by mouth daily. 90 tablet 0   sulfamethoxazole-trimethoprim (BACTRIM DS) 800-160 MG tablet Take 1 tablet by mouth 2 (two) times daily. 14 tablet 0   valsartan (DIOVAN) 80 MG tablet Take 1 tablet (80 mg total) by mouth daily. Please make PCP (Dr. Delford Field) visit for additional refills. 90 tablet 0   No facility-administered medications prior to visit.    Allergies  Allergen Reactions   Lamictal [Lamotrigine] Anaphylaxis, Rash and Other (See Comments)    Stevens-Johnson syndrome and fevers, also   Lamictal [Lamotrigine] Anaphylaxis, Rash and Other (See Comments)    Fevers and Stevens-Johnson syndrome, also   Tramadol Other (See Comments)    Pt had a seizure after taking Tramadol!!   Geodon [Ziprasidone Hcl] Rash   Levetiracetam Anxiety    ROS Review of Systems  Constitutional: Negative.  Negative for chills,  diaphoresis and fever.  HENT: Negative.  Negative for congestion, ear pain, hearing loss, nosebleeds, postnasal drip, rhinorrhea, sinus pressure, sore throat, tinnitus, trouble swallowing and voice change.   Eyes: Negative.  Negative for photophobia and redness.  Respiratory: Negative.  Negative for apnea, cough, choking, chest tightness, shortness of breath, wheezing and stridor.   Cardiovascular: Negative.  Negative for chest pain, palpitations and leg swelling.  Gastrointestinal: Negative.  Negative for abdominal distention, abdominal pain, blood in stool, constipation, diarrhea, nausea and vomiting.  Endocrine: Negative for polydipsia.  Genitourinary: Negative.  Negative for dysuria, flank pain, frequency, hematuria and urgency.  Musculoskeletal: Negative.  Negative for arthralgias, back pain, myalgias and neck pain.  Skin: Negative.  Negative for rash.  Allergic/Immunologic: Negative.  Negative for environmental allergies and food allergies.  Neurological: Negative.  Negative for dizziness, tremors, seizures, syncope, weakness and headaches.  Hematological: Negative.  Negative for adenopathy. Does not bruise/bleed easily.  Psychiatric/Behavioral:  Positive for decreased concentration and dysphoric mood. Negative for agitation, self-injury, sleep disturbance and suicidal ideas. The patient is nervous/anxious. The patient is not hyperactive.       Objective:    Physical Exam Vitals reviewed.  Constitutional:      Appearance: Normal appearance. She is well-developed. She is obese. She is not diaphoretic.  HENT:     Head: Normocephalic and atraumatic.     Right Ear: Tympanic membrane normal.     Left Ear: Tympanic membrane normal.     Nose: Nose normal. No nasal deformity, septal deviation, mucosal edema, congestion or rhinorrhea.     Right Sinus: No maxillary sinus tenderness or frontal sinus tenderness.     Left Sinus: No maxillary sinus tenderness or frontal sinus tenderness.      Mouth/Throat:     Mouth: Mucous membranes are moist.     Pharynx: Oropharynx is clear. No oropharyngeal exudate.     Comments: Fractured left first molar left lower jaw Eyes:     General: No scleral icterus.    Conjunctiva/sclera: Conjunctivae normal.     Pupils: Pupils are equal, round, and reactive to light.  Neck:     Thyroid: No thyromegaly.     Vascular: No carotid bruit or JVD.     Trachea: Trachea normal. No tracheal tenderness or tracheal deviation.  Cardiovascular:     Rate and Rhythm: Normal rate and regular rhythm.     Chest Wall: PMI is not displaced.     Pulses: Normal pulses. No decreased pulses.     Heart sounds: Normal heart sounds, S1 normal and S2 normal. Heart sounds not distant. No murmur heard.    No systolic murmur is present.     No diastolic murmur is present.     No friction rub. No gallop. No S3 or S4 sounds.  Pulmonary:     Effort: No tachypnea, accessory muscle usage or respiratory distress.     Breath sounds: No stridor. No decreased breath sounds, wheezing, rhonchi or rales.  Chest:     Chest wall: No tenderness.  Abdominal:     General: Bowel sounds are normal. There is no distension.     Palpations: Abdomen is soft. Abdomen is not rigid.     Tenderness: There is no abdominal tenderness. There is no guarding or rebound.  Musculoskeletal:        General: No tenderness. Normal range of motion.     Cervical back: Normal range of motion and neck supple. No edema, erythema or rigidity. No muscular tenderness. Normal range of motion.  Lymphadenopathy:     Head:     Right side of head: No submental or submandibular adenopathy.     Left side of head: No submental or submandibular adenopathy.     Cervical: No cervical adenopathy.  Skin:    General: Skin is warm and dry.     Coloration: Skin is not pale.     Findings: No rash.     Nails: There is no clubbing.  Neurological:     General: No focal deficit present.     Mental Status: She is alert and  oriented to person, place, and time. Mental status is at baseline.     Sensory: No sensory deficit.  Psychiatric:        Attention and Perception: Attention and perception  normal.        Mood and Affect: Affect normal. Mood is anxious and depressed.        Speech: Speech normal.        Behavior: Behavior normal. Behavior is cooperative.        Thought Content: Thought content normal. Thought content does not include homicidal or suicidal ideation. Thought content does not include homicidal or suicidal plan.        Cognition and Memory: Cognition and memory normal.        Judgment: Judgment normal.     BP 98/61   Pulse 80   Wt 170 lb 3.2 oz (77.2 kg)   LMP 10/27/2019   SpO2 94%   BMI 30.15 kg/m  Wt Readings from Last 3 Encounters:  01/09/23 170 lb 3.2 oz (77.2 kg)  07/18/22 170 lb (77.1 kg)  07/17/22 186 lb 4.6 oz (84.5 kg)  11/2021 echo IMPRESSIONS Left ventricular ejection fraction, by estimation, is 60 to 65%. The left ventricle has normal function. The left ventricle has no regional wall motion abnormalities. Left ventricular diastolic parameters were normal. 1. Right ventricular systolic function is normal. The right ventricular size is normal. There is normal pulmonary artery systolic pressure. 2. 3. The mitral valve is normal in structure. Trivial mitral valve regurgitation. 4. The aortic valve is normal in structure. Aortic valve regurgitation is not visualized 11/24/21 Cor CT  IMPRESSION: 1. Coronary calcium score of 0. This was 0 percentile for age-, sex, and race-matched controls.   2.  Normal coronary origin with right dominance.   3.  Normal coronary arteries.  CAD RADS 0.   4.  Consider non atherosclerotic causes of chest pain.    Health Maintenance Due  Topic Date Due   COLON CANCER SCREENING ANNUAL FOBT  11/01/2022   PAP SMEAR-Modifier  01/24/2023     There are no preventive care reminders to display for this patient.  Lab Results  Component Value  Date   TSH 3.544 07/20/2022   Lab Results  Component Value Date   WBC 6.2 07/17/2022   HGB 12.1 07/17/2022   HCT 36.6 07/17/2022   MCV 93.8 07/17/2022   PLT 222 07/17/2022   Lab Results  Component Value Date   NA 137 07/17/2022   K 4.5 07/17/2022   CO2 22 07/17/2022   GLUCOSE 117 (H) 07/17/2022   BUN 9 07/17/2022   CREATININE 0.85 07/17/2022   BILITOT 0.4 07/15/2022   ALKPHOS 89 07/15/2022   AST 32 07/15/2022   ALT 38 07/15/2022   PROT 6.5 07/15/2022   ALBUMIN 3.7 07/15/2022   CALCIUM 9.0 07/17/2022   ANIONGAP 7 07/17/2022   EGFR 101 11/11/2021   GFR 75.12 05/25/2017   Lab Results  Component Value Date   CHOL 199 07/20/2022   Lab Results  Component Value Date   HDL 58 07/20/2022   Lab Results  Component Value Date   LDLCALC 118 (H) 07/20/2022   Lab Results  Component Value Date   TRIG 117 07/20/2022   Lab Results  Component Value Date   CHOLHDL 3.4 07/20/2022   Lab Results  Component Value Date   HGBA1C 5.7 (H) 07/20/2022      Assessment & Plan:   Problem List Items Addressed This Visit       Cardiovascular and Mediastinum   Essential hypertension    Hypertension at goal continue with low-dose propranolol valsartan and and clonidine will also affect blood pressure check labs  Relevant Medications   atorvastatin (LIPITOR) 20 MG tablet   valsartan (DIOVAN) 80 MG tablet   Other Relevant Orders   CBC with Differential/Platelet     Other   MDD (major depressive disorder), recurrent severe, without psychosis    Having a lot of anxiety over grief reaction will refer to licensed clinical social work and back to psychiatry no change in medications for now      PTSD (post-traumatic stress disorder)    As per psychiatry      Mixed hyperlipidemia   Relevant Medications   atorvastatin (LIPITOR) 20 MG tablet   valsartan (DIOVAN) 80 MG tablet   Prediabetes    Monitor check labs      Tobacco use       Current smoking consumption amount:  3 cigarettes daily  Dicsussion on advise to quit smoking and smoking impacts: Cardiovascular impacts  Patient's willingness to quit: Willing to quit  Methods to quit smoking discussed: Behavioral modification  Medication management of smoking session drugs discussed: Nicotine replacement  Resources provided:  AVS   Setting quit date not established  Follow-up arranged 2 months   Time spent counseling the patient: 5 minutes        History of alcohol use disorder    Patient has been sober for some time now      Other Visit Diagnoses     Colon cancer screening    -  Primary   Relevant Orders   Fecal occult blood, imunochemical   Hyperglycemia       Relevant Orders   Comprehensive metabolic panel   Hemoglobin A1c   Encounter for screening mammogram for malignant neoplasm of breast       Relevant Orders   MM DIGITAL SCREENING BILATERAL      Meds ordered this encounter  Medications   atorvastatin (LIPITOR) 20 MG tablet    Sig: Take 1 tablet (20 mg total) by mouth at bedtime.    Dispense:  30 tablet    Refill:  1    Must have PCP visit for refills   norethindrone (MICRONOR) 0.35 MG tablet    Sig: Take 1 tablet (0.35 mg total) by mouth daily.    Dispense:  28 tablet    Refill:  11   pantoprazole (PROTONIX) 40 MG tablet    Sig: Take 1 tablet (40 mg total) by mouth 2 (two) times daily before a meal.    Dispense:  60 tablet    Refill:  3    Patient needs PCP visit for more refills.   valsartan (DIOVAN) 80 MG tablet    Sig: Take 1 tablet (80 mg total) by mouth daily.    Dispense:  90 tablet    Refill:  2    Must have PCP visit for refills  Fecal occult and mammogram studies ordered 30 minutes spent history and physical complex decision making high multiple systems assessed Follow-up: Return in about 5 months (around 06/11/2023) for htn, primary care follow up.    Shan Levans, MD

## 2023-01-09 ENCOUNTER — Telehealth: Payer: Self-pay | Admitting: Critical Care Medicine

## 2023-01-09 ENCOUNTER — Encounter: Payer: Self-pay | Admitting: Critical Care Medicine

## 2023-01-09 ENCOUNTER — Ambulatory Visit: Payer: Self-pay | Attending: Critical Care Medicine | Admitting: Critical Care Medicine

## 2023-01-09 VITALS — BP 98/61 | HR 80 | Wt 170.2 lb

## 2023-01-09 DIAGNOSIS — F332 Major depressive disorder, recurrent severe without psychotic features: Secondary | ICD-10-CM

## 2023-01-09 DIAGNOSIS — Z87898 Personal history of other specified conditions: Secondary | ICD-10-CM

## 2023-01-09 DIAGNOSIS — F4310 Post-traumatic stress disorder, unspecified: Secondary | ICD-10-CM

## 2023-01-09 DIAGNOSIS — F1421 Cocaine dependence, in remission: Secondary | ICD-10-CM | POA: Insufficient documentation

## 2023-01-09 DIAGNOSIS — Z79899 Other long term (current) drug therapy: Secondary | ICD-10-CM | POA: Insufficient documentation

## 2023-01-09 DIAGNOSIS — Z1231 Encounter for screening mammogram for malignant neoplasm of breast: Secondary | ICD-10-CM

## 2023-01-09 DIAGNOSIS — Z713 Dietary counseling and surveillance: Secondary | ICD-10-CM | POA: Insufficient documentation

## 2023-01-09 DIAGNOSIS — Z9151 Personal history of suicidal behavior: Secondary | ICD-10-CM | POA: Insufficient documentation

## 2023-01-09 DIAGNOSIS — F411 Generalized anxiety disorder: Secondary | ICD-10-CM | POA: Insufficient documentation

## 2023-01-09 DIAGNOSIS — Z1211 Encounter for screening for malignant neoplasm of colon: Secondary | ICD-10-CM

## 2023-01-09 DIAGNOSIS — F319 Bipolar disorder, unspecified: Secondary | ICD-10-CM | POA: Insufficient documentation

## 2023-01-09 DIAGNOSIS — Z720 Tobacco use: Secondary | ICD-10-CM

## 2023-01-09 DIAGNOSIS — E782 Mixed hyperlipidemia: Secondary | ICD-10-CM | POA: Insufficient documentation

## 2023-01-09 DIAGNOSIS — I10 Essential (primary) hypertension: Secondary | ICD-10-CM | POA: Insufficient documentation

## 2023-01-09 DIAGNOSIS — R7303 Prediabetes: Secondary | ICD-10-CM | POA: Insufficient documentation

## 2023-01-09 DIAGNOSIS — R739 Hyperglycemia, unspecified: Secondary | ICD-10-CM

## 2023-01-09 MED ORDER — PANTOPRAZOLE SODIUM 40 MG PO TBEC: 40.0000 mg | DELAYED_RELEASE_TABLET | Freq: Two times a day (BID) | ORAL | 3 refills | Status: DC

## 2023-01-09 MED ORDER — VALSARTAN 80 MG PO TABS: 80.0000 mg | ORAL_TABLET | Freq: Every day | ORAL | 2 refills | Status: DC

## 2023-01-09 MED ORDER — NORETHINDRONE 0.35 MG PO TABS: 1.0000 | ORAL_TABLET | Freq: Every day | ORAL | 11 refills | Status: DC

## 2023-01-09 MED ORDER — ATORVASTATIN CALCIUM 20 MG PO TABS
20.0000 mg | ORAL_TABLET | Freq: Every day | ORAL | 1 refills | Status: DC
Start: 2023-01-09 — End: 2023-04-13

## 2023-01-09 NOTE — Assessment & Plan Note (Signed)
Having a lot of anxiety over grief reaction will refer to licensed clinical social work and back to psychiatry no change in medications for now

## 2023-01-09 NOTE — Assessment & Plan Note (Signed)
Monitor check labs

## 2023-01-09 NOTE — Assessment & Plan Note (Signed)
As per psychiatry 

## 2023-01-09 NOTE — Patient Instructions (Signed)
Will get [employer] our Child psychotherapist to call you Will has mental health to contact you A pap smear will be ordered Labs today and mammogram will be ordered Colon cancer screen will be ordered Return DR Montavious Wierzba 5 months

## 2023-01-09 NOTE — Telephone Encounter (Signed)
Needs counseling for grief lost a close friend an overdose

## 2023-01-09 NOTE — Assessment & Plan Note (Signed)
Patient has been sober for some time now

## 2023-01-09 NOTE — Assessment & Plan Note (Signed)
  .   Current smoking consumption amount: 3 cigarettes daily  . Dicsussion on advise to quit smoking and smoking impacts: Cardiovascular impacts  . Patient's willingness to quit: Willing to quit  . Methods to quit smoking discussed: Behavioral modification  . Medication management of smoking session drugs discussed: Nicotine replacement  . Resources provided:  AVS   . Setting quit date not established  . Follow-up arranged 2 months   Time spent counseling the patient: 5 minutes   

## 2023-01-09 NOTE — Assessment & Plan Note (Signed)
Hypertension at goal continue with low-dose propranolol valsartan and and clonidine will also affect blood pressure check labs

## 2023-01-12 ENCOUNTER — Other Ambulatory Visit: Payer: Self-pay | Admitting: Pharmacist

## 2023-01-12 ENCOUNTER — Other Ambulatory Visit: Payer: Self-pay

## 2023-01-12 ENCOUNTER — Ambulatory Visit: Payer: Self-pay | Attending: Critical Care Medicine

## 2023-01-12 MED ORDER — PANTOPRAZOLE SODIUM 40 MG PO TBEC
40.0000 mg | DELAYED_RELEASE_TABLET | Freq: Two times a day (BID) | ORAL | 2 refills | Status: DC
  Filled 2023-01-12 – 2023-01-15 (×2): qty 60, 30d supply, fill #0
  Filled 2023-02-12 (×2): qty 60, 30d supply, fill #1
  Filled 2023-03-17 – 2023-03-19 (×2): qty 60, 30d supply, fill #2

## 2023-01-12 MED ORDER — VALSARTAN 80 MG PO TABS
80.0000 mg | ORAL_TABLET | Freq: Every day | ORAL | 1 refills | Status: DC
  Filled 2023-01-12 – 2023-01-21 (×3): qty 90, 90d supply, fill #0
  Filled 2023-04-10: qty 90, 90d supply, fill #1

## 2023-01-13 LAB — CBC WITH DIFFERENTIAL/PLATELET
Basophils Absolute: 0 10*3/uL (ref 0.0–0.2)
Basos: 0 %
EOS (ABSOLUTE): 0.2 10*3/uL (ref 0.0–0.4)
Eos: 2 %
Hematocrit: 40.1 % (ref 34.0–46.6)
Hemoglobin: 13.5 g/dL (ref 11.1–15.9)
Immature Grans (Abs): 0 10*3/uL (ref 0.0–0.1)
Immature Granulocytes: 0 %
Lymphocytes Absolute: 3.7 10*3/uL — ABNORMAL HIGH (ref 0.7–3.1)
Lymphs: 51 %
MCH: 30.1 pg (ref 26.6–33.0)
MCHC: 33.7 g/dL (ref 31.5–35.7)
MCV: 90 fL (ref 79–97)
Monocytes Absolute: 0.7 10*3/uL (ref 0.1–0.9)
Monocytes: 10 %
Neutrophils Absolute: 2.8 10*3/uL (ref 1.4–7.0)
Neutrophils: 37 %
Platelets: 287 10*3/uL (ref 150–450)
RBC: 4.48 x10E6/uL (ref 3.77–5.28)
RDW: 12.3 % (ref 11.7–15.4)
WBC: 7.4 10*3/uL (ref 3.4–10.8)

## 2023-01-13 LAB — COMPREHENSIVE METABOLIC PANEL
ALT: 31 IU/L (ref 0–32)
AST: 24 IU/L (ref 0–40)
Albumin/Globulin Ratio: 1.8 (ref 1.2–2.2)
Albumin: 4.4 g/dL (ref 3.9–4.9)
Alkaline Phosphatase: 97 IU/L (ref 44–121)
BUN/Creatinine Ratio: 8 — ABNORMAL LOW (ref 9–23)
BUN: 6 mg/dL (ref 6–24)
Bilirubin Total: <0.2 mg/dL (ref 0.0–1.2)
CO2: 20 mmol/L (ref 20–29)
Calcium: 9.5 mg/dL (ref 8.7–10.2)
Chloride: 102 mmol/L (ref 96–106)
Creatinine, Ser: 0.75 mg/dL (ref 0.57–1.00)
Globulin, Total: 2.5 g/dL (ref 1.5–4.5)
Glucose: 91 mg/dL (ref 70–99)
Potassium: 4.6 mmol/L (ref 3.5–5.2)
Sodium: 138 mmol/L (ref 134–144)
Total Protein: 6.9 g/dL (ref 6.0–8.5)
eGFR: 98 mL/min/{1.73_m2} (ref >59)

## 2023-01-13 LAB — HEMOGLOBIN A1C
Est. average glucose Bld gHb Est-mCnc: 126 mg/dL
Hgb A1c MFr Bld: 6 % — ABNORMAL HIGH (ref 4.8–5.6)

## 2023-01-14 LAB — FECAL OCCULT BLOOD, IMMUNOCHEMICAL: Fecal Occult Bld: NEGATIVE

## 2023-01-15 ENCOUNTER — Other Ambulatory Visit: Payer: Self-pay

## 2023-01-16 NOTE — Telephone Encounter (Signed)
LCSWA called patient today to introduce herself and to assess patients' mental health needs. Patient did not answer the phone. LCSWA was able to leave a brief message with the patient asking them to return the call. Patient was referred by PCP for Needs counseling for grief lost a close friend an overdose.

## 2023-01-18 ENCOUNTER — Telehealth: Payer: Self-pay

## 2023-01-18 NOTE — Telephone Encounter (Signed)
-----   Message from [name], [place] sent at 01/16/2023 11:49 AM EDT ----- Please call patient and let her know that her kidney function and liver function are within normal limits, she does not show signs of anemia nor infection.  Her fecal occult was negative.  Her A1c increased from 5.7 to 6.0.  Very important that she follow a low sugar diet and have this rechecked in 3 to 6 months.  Patient should consider trial metformin if she is unable to improve A1c with lifestyle modifications.

## 2023-01-18 NOTE — Telephone Encounter (Signed)
Pt was called and vm was left, Information has been sent to nurse pool.   

## 2023-01-22 ENCOUNTER — Other Ambulatory Visit: Payer: Self-pay

## 2023-01-25 ENCOUNTER — Ambulatory Visit: Payer: Self-pay | Admitting: Physician Assistant

## 2023-02-01 ENCOUNTER — Other Ambulatory Visit: Payer: Self-pay

## 2023-02-02 ENCOUNTER — Other Ambulatory Visit: Payer: Self-pay

## 2023-02-05 ENCOUNTER — Ambulatory Visit: Payer: Self-pay | Admitting: Physician Assistant

## 2023-02-12 ENCOUNTER — Other Ambulatory Visit: Payer: Self-pay

## 2023-02-13 ENCOUNTER — Other Ambulatory Visit: Payer: Self-pay

## 2023-02-16 ENCOUNTER — Other Ambulatory Visit: Payer: Self-pay

## 2023-02-26 ENCOUNTER — Other Ambulatory Visit: Payer: Self-pay

## 2023-02-26 ENCOUNTER — Other Ambulatory Visit (HOSPITAL_COMMUNITY): Payer: Self-pay | Admitting: Psychiatry

## 2023-02-26 DIAGNOSIS — I10 Essential (primary) hypertension: Secondary | ICD-10-CM

## 2023-02-27 ENCOUNTER — Other Ambulatory Visit: Payer: Self-pay

## 2023-02-28 ENCOUNTER — Other Ambulatory Visit: Payer: Self-pay

## 2023-03-06 ENCOUNTER — Other Ambulatory Visit (HOSPITAL_COMMUNITY): Payer: Self-pay | Admitting: Psychiatry

## 2023-03-06 ENCOUNTER — Other Ambulatory Visit: Payer: Self-pay

## 2023-03-06 DIAGNOSIS — I10 Essential (primary) hypertension: Secondary | ICD-10-CM

## 2023-03-07 ENCOUNTER — Other Ambulatory Visit: Payer: Self-pay

## 2023-03-07 ENCOUNTER — Other Ambulatory Visit (HOSPITAL_COMMUNITY): Payer: Self-pay | Admitting: Critical Care Medicine

## 2023-03-07 DIAGNOSIS — I10 Essential (primary) hypertension: Secondary | ICD-10-CM

## 2023-03-07 MED ORDER — PROPRANOLOL HCL 10 MG PO TABS
10.0000 mg | ORAL_TABLET | Freq: Every day | ORAL | 1 refills | Status: DC
Start: 2023-03-07 — End: 2023-03-23
  Filled 2023-03-07: qty 90, 90d supply, fill #0
  Filled 2023-03-17: qty 30, 30d supply, fill #0

## 2023-03-08 ENCOUNTER — Other Ambulatory Visit: Payer: Self-pay

## 2023-03-13 ENCOUNTER — Other Ambulatory Visit: Payer: Self-pay

## 2023-03-14 ENCOUNTER — Other Ambulatory Visit: Payer: Self-pay

## 2023-03-19 ENCOUNTER — Other Ambulatory Visit: Payer: Self-pay

## 2023-03-23 ENCOUNTER — Other Ambulatory Visit: Payer: Self-pay

## 2023-03-23 ENCOUNTER — Telehealth (INDEPENDENT_AMBULATORY_CARE_PROVIDER_SITE_OTHER): Payer: No Payment, Other | Admitting: Student in an Organized Health Care Education/Training Program

## 2023-03-23 ENCOUNTER — Encounter (HOSPITAL_COMMUNITY): Payer: Self-pay | Admitting: Student in an Organized Health Care Education/Training Program

## 2023-03-23 DIAGNOSIS — F1491 Cocaine use, unspecified, in remission: Secondary | ICD-10-CM | POA: Diagnosis not present

## 2023-03-23 DIAGNOSIS — F1091 Alcohol use, unspecified, in remission: Secondary | ICD-10-CM | POA: Diagnosis not present

## 2023-03-23 DIAGNOSIS — F4310 Post-traumatic stress disorder, unspecified: Secondary | ICD-10-CM | POA: Diagnosis not present

## 2023-03-23 DIAGNOSIS — F411 Generalized anxiety disorder: Secondary | ICD-10-CM

## 2023-03-23 DIAGNOSIS — F3341 Major depressive disorder, recurrent, in partial remission: Secondary | ICD-10-CM | POA: Diagnosis not present

## 2023-03-23 DIAGNOSIS — I10 Essential (primary) hypertension: Secondary | ICD-10-CM

## 2023-03-23 MED ORDER — DESVENLAFAXINE SUCCINATE ER 25 MG PO TB24
75.0000 mg | ORAL_TABLET | Freq: Every day | ORAL | 3 refills | Status: DC
Start: 2023-03-23 — End: 2023-06-04
  Filled 2023-03-23 – 2023-04-17 (×2): qty 90, 30d supply, fill #0
  Filled 2023-04-18: qty 30, 10d supply, fill #0
  Filled 2023-04-18: qty 60, 20d supply, fill #0
  Filled 2023-05-18: qty 90, 30d supply, fill #1

## 2023-03-23 MED ORDER — HYDROXYZINE HCL 50 MG PO TABS
50.0000 mg | ORAL_TABLET | Freq: Three times a day (TID) | ORAL | 3 refills | Status: DC | PRN
Start: 2023-03-23 — End: 2023-06-04
  Filled 2023-03-23: qty 90, 30d supply, fill #0
  Filled 2023-04-19 (×3): qty 90, 30d supply, fill #1
  Filled 2023-05-17: qty 90, 30d supply, fill #2

## 2023-03-23 MED ORDER — GABAPENTIN 600 MG PO TABS
600.0000 mg | ORAL_TABLET | Freq: Three times a day (TID) | ORAL | 3 refills | Status: DC
Start: 2023-03-23 — End: 2023-04-17
  Filled 2023-03-23 – 2023-04-15 (×5): qty 90, 30d supply, fill #0
  Filled 2023-04-16: qty 66, 22d supply, fill #0

## 2023-03-23 MED ORDER — MIRTAZAPINE 30 MG PO TBDP
30.0000 mg | ORAL_TABLET | Freq: Every day | ORAL | 3 refills | Status: DC
Start: 2023-03-23 — End: 2023-06-04
  Filled 2023-03-23 – 2023-04-13 (×2): qty 30, 30d supply, fill #0
  Filled 2023-05-09: qty 30, 30d supply, fill #1

## 2023-03-23 MED ORDER — CLONIDINE HCL 0.2 MG PO TABS
0.2000 mg | ORAL_TABLET | Freq: Every day | ORAL | 3 refills | Status: DC
Start: 2023-03-23 — End: 2023-06-04
  Filled 2023-03-23: qty 30, 30d supply, fill #0
  Filled 2023-04-19 (×2): qty 30, 30d supply, fill #1
  Filled 2023-05-17: qty 30, 30d supply, fill #2

## 2023-03-23 MED ORDER — PROPRANOLOL HCL 10 MG PO TABS
10.0000 mg | ORAL_TABLET | Freq: Two times a day (BID) | ORAL | 3 refills | Status: DC
Start: 2023-03-23 — End: 2023-06-04
  Filled 2023-03-23 – 2023-03-27 (×3): qty 60, 30d supply, fill #0
  Filled 2023-04-27 (×2): qty 60, 30d supply, fill #1
  Filled 2023-05-27: qty 60, 30d supply, fill #2

## 2023-03-23 NOTE — Patient Instructions (Signed)
Please contact one of the following facilities to start medication management and therapy services:   [facility] [address] (SECOND FLOOR)  [place], [place]  [address] Phone: [phone]  [facility]  [address] [place], [place] [address] Phone: [phone]  [facility] [place]  [address]  [place], [place] [address]  [facility] [place]  [address]  [place], [place] [address] Phone: [phone]  

## 2023-03-23 NOTE — Progress Notes (Signed)
Virtual Visit via Video Note  I connected with Margaret Mccann on 03/23/23 at 11:00 AM EDT by a video enabled telemedicine application and verified that I am speaking with the correct person using two identifiers.  Location: Patient: Home Provider: Office   I discussed the limitations of evaluation and management by telemedicine and the availability of in person appointments. The patient expressed understanding and agreed to proceed.    I discussed the assessment and treatment plan with the patient. The patient was provided an opportunity to ask questions and all were answered. The patient agreed with the plan and demonstrated an understanding of the instructions.   The patient was advised to call back or seek an in-person evaluation if the symptoms worsen or if the condition fails to improve as anticipated.  I provided 25 minutes of non-face-to-face time during this encounter.   Margaret Morton, MD  [facility] MD/PA/NP OP Progress Note  03/23/2023 1:01 PM Margaret Confer Carnie [name]  MRN:  [id number]  Chief Complaint:  Chief Complaint  Patient presents with   Follow-up   HPI: Margaret "Higinio Plan" Mccann Memo is a 52 year old female with a PPH of MDD, GAD, PTSD, cocaine use, tobacco use, EtOH use disorder.  Patient reports she has the following medication regimen:   Gabapentin 600 mg TID Remeron 30 mg nightly Pristiq 75 mg daily Zyprexa 2.5 mg nightly Hydroxyzine 50 mg 3 times daily Clonidine 0.2 mg nightly Propranolol 10 mg daily (prescribed by PCP)  Patient reports that she is mentally doing ok, she has an acute cough. Patient reports that she does feel like she has some anxiety. Patient reports that she is still sober from [place] and cocaine. Patient reports that she feels like her anxiety is a bit high due to recent deaths in the family. Patient reports that the clonidine continues to be helpful to keep her night terrors night away and has really been helpful for her PTSD. Patient reports  that the anxiety she has is worse at night as she again has intrusive thoughts related to thinking of a friend who recently passed and then this spiraling to the dead friends and family she has seen. Patient reports that she feels like she can handle day time anxiety well, and if she feels like she needs help she will go to an AA meeting. Patient feels supported.   Patient is smoking 5-6 cigarettes she is more focused on staying sober from [place] and cocaine. Patient reports that she has a good appetite and she is exercising with improved diet. Patient reports that she is able to find joy. She is excited to be moving out of her parents home in the next month and getting her own place. Patient reports she enjoys spending time outdoors and going to the pool. Patient reports that she is getting 6.5-9h of sleep and is mostly well rested.   Patient denies symptoms of mania.   Patient denies SI, HI, and AVH.    Visit Diagnosis:    ICD-10-CM   1. Cocaine use disorder in remission  F14.91     2. Alcohol use disorder in remission  F10.91     3. Primary hypertension  I10       Past Psychiatric History:  NPT: 3x, multiple suicide attempts via overdose OPT: Yes Therapist: Previous, interested in appt Geodon (tongue swell), Effexor, Prozac, Lamictal (SJS), Paxil, Cymbalta Likes Pristiq the best Hx of Klonopin   Last visit 10/2022-patient fixated on increasing her gabapentin and having the exact  same medication regimen that she had a few years ago when she was in [place].  Was not willing to increase patient to 2400 mg gabapentin but did increase to 1800 mg. 12/2022- Patient endorsed good insight and does not appear to be decompensating despite recent loss of friend to substance use disorder. Patient is using her coping skills and support system appropriately   Trauma history: -Found fianc after he hanged himself in 2022, she herself attempted suicide approximately 3 weeks later and was  hospitalized -Witnessed her sister die of seizures after attempting suicide (eventually completing) via overdosing on Wellbutrin however, her sister endorse remorse but was not able to be revived Past Medical History:  Past Medical History:  Diagnosis Date   Alcohol withdrawal seizure with complication, with unspecified complication (HCC) 12/23/2020   Anxiety    Bipolar 1 disorder (HCC)    Depression    Intentional drug overdose (HCC) 08/04/2020   Major depressive disorder, recurrent episode with anxious distress (HCC) 08/11/2020   Morgellons syndrome    MRSA (methicillin resistant Staphylococcus aureus)    Suicide attempt (HCC)    Suicide by drug overdose (HCC) 08/11/2020   Tricuspid valve regurgitation 12/18/2020    Past Surgical History:  Procedure Laterality Date   APPENDECTOMY      Family Psychiatric History: Sister - committed suicide in 2005 by overdosing on Wellbutrin     Family History:  Family History  Problem Relation Age of Onset   Heart attack Father 37   Stroke Father    Severe combined immunodeficiency Sister    Breast cancer Paternal Aunt    Ulcerative colitis Neg Hx    Esophageal cancer Neg Hx     Social History:  Social History   Socioeconomic History   Marital status: Single    Spouse name: Not on file   Number of children: Not on file   Years of education: Not on file   Highest education level: Not on file  Occupational History   Not on file  Tobacco Use   Smoking status: Some Days    Packs/day: 0.25    Years: 5.00    Additional pack years: 0.00    Total pack years: 1.25    Types: Cigarettes   Smokeless tobacco: Never  Vaping Use   Vaping Use: Never used  Substance and Sexual Activity   Alcohol use: Yes    Comment: occas   Drug use: Not Currently    Types: Cocaine    Comment: relapsed the past weekend, no cocaine use prior 6 months   Sexual activity: Not Currently    Birth control/protection: Pill  Other Topics Concern   Not on  file  Social History Narrative   ** Merged History Encounter **       Social Determinants of Health   Financial Resource Strain: Not on file  Food Insecurity: No Food Insecurity (07/18/2022)   Hunger Vital Sign    Worried About Running Out of Food in the Last Year: Never true    Ran Out of Food in the Last Year: Never true  Transportation Needs: No Transportation Needs (07/18/2022)   PRAPARE - Administrator, Civil Service (Medical): No    Lack of Transportation (Non-Medical): No  Physical Activity: Not on file  Stress: Not on file  Social Connections: Not on file    Allergies:  Allergies  Allergen Reactions   Lamictal [Lamotrigine] Anaphylaxis, Rash and Other (See Comments)    Stevens-Johnson syndrome and fevers, also  Lamictal [Lamotrigine] Anaphylaxis, Rash and Other (See Comments)    Fevers and Stevens-Johnson syndrome, also   Tramadol Other (See Comments)    Pt had a seizure after taking Tramadol!!   Geodon [Ziprasidone Hcl] Rash   Levetiracetam Anxiety    Metabolic Disorder Labs: Lab Results  Component Value Date   HGBA1C 6.0 (H) 01/12/2023   MPG 116.89 07/20/2022   MPG 114.02 12/19/2020   No results found for: "PROLACTIN" Lab Results  Component Value Date   CHOL 199 07/20/2022   TRIG 117 07/20/2022   HDL 58 07/20/2022   CHOLHDL 3.4 07/20/2022   VLDL 23 07/20/2022   LDLCALC 118 (H) 07/20/2022   LDLCALC 69 05/16/2021   Lab Results  Component Value Date   TSH 3.544 07/20/2022   TSH 1.300 05/16/2021    Therapeutic Level Labs: No results found for: "LITHIUM" No results found for: "VALPROATE" No results found for: "CBMZ"  Current Medications: Current Outpatient Medications  Medication Sig Dispense Refill   atorvastatin (LIPITOR) 20 MG tablet Take 1 tablet (20 mg total) by mouth at bedtime. 30 tablet 1   cloNIDine (CATAPRES) 0.2 MG tablet Take 1 tablet (0.2 mg total) by mouth at bedtime. 30 tablet 3   desvenlafaxine (PRISTIQ) 25 MG 24 hr  tablet Take 3 tablets (75 mg total) by mouth daily. 90 tablet 3   gabapentin (NEURONTIN) 600 MG tablet Take 1 tablet (600 mg total) by mouth 3 (three) times daily. 90 tablet 3   hydrOXYzine (ATARAX) 50 MG tablet Take 1 tablet (50 mg total) by mouth 3 (three) times daily as needed for anxiety. 90 tablet 3   melatonin 3 MG TABS tablet Take 1 tablet (3 mg total) by mouth at bedtime.  0   mirtazapine (REMERON SOL-TAB) 30 MG disintegrating tablet Take 1 tablet (30 mg total) by mouth at bedtime. 30 tablet 3   norethindrone (MICRONOR) 0.35 MG tablet Take 1 tablet (0.35 mg total) by mouth daily. 28 tablet 11   OLANZapine (ZYPREXA) 2.5 MG tablet Take 1 tablet (2.5 mg total) by mouth at bedtime. 30 tablet 3   pantoprazole (PROTONIX) 40 MG tablet Take 1 tablet (40 mg total) by mouth 2 (two) times daily before a meal. 60 tablet 2   propranolol (INDERAL) 10 MG tablet Take 1 tablet (10 mg total) by mouth daily. 90 tablet 1   valsartan (DIOVAN) 80 MG tablet Take 1 tablet (80 mg total) by mouth daily. 90 tablet 1   No current facility-administered medications for this visit.     Psychiatric Specialty Exam: Review of Systems  Psychiatric/Behavioral:  Negative for dysphoric mood, hallucinations and suicidal ideas. The patient is nervous/anxious.     Last menstrual period 10/27/2019.There is no height or weight on file to calculate BMI.  General Appearance: Casual  Eye Contact:  Good  Speech:  Clear and Coherent  Volume:  Normal  Mood:  Euthymic  Affect:  Appropriate  Thought Process:  Coherent  Orientation:  Full (Time, Place, and Person)  Thought Content: Logical   Suicidal Thoughts:  No  Homicidal Thoughts:  No  Memory:  Immediate;   Good Recent;   Good  Judgement:  Good  Insight:  Good  Psychomotor Activity:  Normal  Concentration:  Concentration: Good  Recall:  Good  Fund of Knowledge: Good  Language: Good  Akathisia:  No  Handed:    AIMS (if indicated): not done  Assets:  Communication  Skills Desire for Improvement Housing Leisure Time Resilience Social Support  ADL's:  Intact  Cognition: WNL  Sleep:  Good   Screenings: AIMS    Flowsheet Row [facility]  AIMS Total Score 0      AUDIT    Flowsheet Row [facility]) [facility] Ad[facility] ([facility] Ad[facility] (Dis[facility]  Alcohol Use Disorder Identification Test Final Score (AUDIT) 6 11 0      GAD-7    Flowsheet Row Office Visit from 01/09/2023 in [facility] Video Visit from 07/31/2022 in [facility] Video Visit from 06/15/2022 in [facility] Office Visit from 04[facility]24/2022 in [facility] Video Visit from 03/23/2022 in [facility]  Total GAD-7 Score 11 9 4 3 2       PHQ2-9    Flowsheet Row Office Visit from 01/09/2023 in [facility] Video Visit from 07/31/2022 in [facility] Video Visit from 06/15/2022 in [facility] Office Visit from 04/24/2022 in [facility] Video Visit from 03/23/2022 in [facility]  PHQ-2 Total Score 0 1 0 0 0  PHQ-9 Total Score -- 6 0 -- 1      Flowsheet Row [facility]) [facility] ED from 01/12/2022 in [facility] Video Visit from 08/09/2021 in [facility]  C-SSRS RISK CATEGORY No Risk No Risk No Risk        Assessment and Plan: Patient do farily well, able to maintain sobriety and will be  moving out on her own soon. She is still struggling with intrusive thoughts, anxiety, and rumination around her trauma that is worse at night. There was discussion about increasing night time Clonidine; as it has really been helpful for these symptoms, but upon EMR review patient last BP was softer, with a good HR. Patient may be able to receive benefit from increasing her propanolol dose to help with these symptoms. Briefly discussed this change with PCP, who was prescribing propanolol, and he is ok with the change. Discussed with patient the benefit for grief counseling giving the degree of loss and the chances of similar incidents happening, patient was partially open to the idea. She will reach out to the [facility] as she knows the owners, but also appreciated resources.   Chronic PTSD MDD in remission GAD - Continue Pristiq 75 mg daily - Continue gabapentin to 600 mg 3 times daily - Continue hydroxyzine 50 mg 3 times daily - Continue Remeron 30 mg nightly - Continue Zyprexa 2.5 mg nightly - Continue clonidine 0.2 mg nightly - Increase Propanolol to 10mg  BID - Resources for Grief counseling   Primary hypertension - Propranolol 10 mg (prescribed by PCP), will increase per above - valsartan 80mg  daily   Hx EtOH use disorder, severe - Gabapentin per above  Discussed with patient pending transition of care to a new resident starting July 1st, and likely discontinuation of care by this provider at that time.     Collaboration of Care: Collaboration of Care:   Patient/Guardian was advised Release of Information must be obtained prior to any record release in order to collaborate their care with  an outside provider. Patient/Guardian was advised if they have not already done so to contact the registration department to sign all necessary forms in order for [place] to release information regarding their care.   Consent: Patient/Guardian gives verbal consent for treatment and assignment of  benefits for services provided during this visit. Patient/Guardian expressed understanding and agreed to proceed.   PGY-3 Margaret Morton, MD 03/23/2023, 1:01 PM

## 2023-03-26 ENCOUNTER — Other Ambulatory Visit: Payer: Self-pay

## 2023-03-27 ENCOUNTER — Other Ambulatory Visit: Payer: Self-pay | Admitting: Critical Care Medicine

## 2023-03-27 ENCOUNTER — Other Ambulatory Visit: Payer: Self-pay

## 2023-03-27 MED ORDER — NORETHINDRONE 0.35 MG PO TABS
1.0000 | ORAL_TABLET | Freq: Every day | ORAL | 2 refills | Status: DC
  Filled 2023-03-27: qty 28, 28d supply, fill #0
  Filled 2023-04-27: qty 28, 28d supply, fill #1
  Filled 2023-05-24: qty 28, 28d supply, fill #2

## 2023-04-02 ENCOUNTER — Other Ambulatory Visit: Payer: Self-pay

## 2023-04-11 ENCOUNTER — Other Ambulatory Visit: Payer: Self-pay

## 2023-04-13 ENCOUNTER — Other Ambulatory Visit: Payer: Self-pay

## 2023-04-13 ENCOUNTER — Other Ambulatory Visit: Payer: Self-pay | Admitting: Critical Care Medicine

## 2023-04-13 ENCOUNTER — Telehealth (HOSPITAL_COMMUNITY): Payer: Self-pay

## 2023-04-13 ENCOUNTER — Other Ambulatory Visit (HOSPITAL_COMMUNITY): Payer: Self-pay

## 2023-04-13 DIAGNOSIS — E782 Mixed hyperlipidemia: Secondary | ICD-10-CM

## 2023-04-13 MED ORDER — ATORVASTATIN CALCIUM 20 MG PO TABS
20.0000 mg | ORAL_TABLET | Freq: Every day | ORAL | 0 refills | Status: DC
Start: 2023-04-13 — End: 2023-06-12
  Filled 2023-04-13: qty 90, 90d supply, fill #0

## 2023-04-13 NOTE — Telephone Encounter (Signed)
Patients pharmacy called about patients Gabapentin. Patient got a 90 day supply on 4/15, which should have lasted until 7/15. This was an early fill because patient said she was going out of town. She called the pharmacy again on 6/24 and asked for an early fill because she was going out of town again, they gave her a 30 day supply. Patient called again today and told the pharmacy that she is going out of town again and needs an early fill. Patient should currently have at least a 30 day supply of the Gabapentin, pharmacist wanted [place] to have a heads up in case patient calls here to get an early fill.

## 2023-04-16 ENCOUNTER — Other Ambulatory Visit (HOSPITAL_COMMUNITY): Payer: Self-pay

## 2023-04-16 ENCOUNTER — Telehealth (HOSPITAL_COMMUNITY): Payer: Self-pay | Admitting: Student in an Organized Health Care Education/Training Program

## 2023-04-16 ENCOUNTER — Other Ambulatory Visit (HOSPITAL_BASED_OUTPATIENT_CLINIC_OR_DEPARTMENT_OTHER): Payer: Self-pay

## 2023-04-16 ENCOUNTER — Other Ambulatory Visit (HOSPITAL_COMMUNITY): Payer: Self-pay | Admitting: Student in an Organized Health Care Education/Training Program

## 2023-04-16 ENCOUNTER — Other Ambulatory Visit (HOSPITAL_COMMUNITY): Payer: Self-pay | Admitting: Student

## 2023-04-16 ENCOUNTER — Other Ambulatory Visit: Payer: Self-pay

## 2023-04-16 NOTE — Telephone Encounter (Signed)
Discussed with Dr. Lucianne Muss, patient has been requested to come in person, tom for assessment, given request and report of requiring higher dose than prescribed. Patient is in agreement. Provider will not be writing a new prescription without being see. TY!

## 2023-04-17 ENCOUNTER — Telehealth (INDEPENDENT_AMBULATORY_CARE_PROVIDER_SITE_OTHER): Payer: No Payment, Other | Admitting: Student

## 2023-04-17 ENCOUNTER — Other Ambulatory Visit: Payer: Self-pay

## 2023-04-17 ENCOUNTER — Encounter (HOSPITAL_COMMUNITY): Payer: Self-pay | Admitting: Student

## 2023-04-17 ENCOUNTER — Other Ambulatory Visit (HOSPITAL_COMMUNITY): Payer: Self-pay

## 2023-04-17 DIAGNOSIS — F1491 Cocaine use, unspecified, in remission: Secondary | ICD-10-CM

## 2023-04-17 DIAGNOSIS — F411 Generalized anxiety disorder: Secondary | ICD-10-CM

## 2023-04-17 DIAGNOSIS — F3341 Major depressive disorder, recurrent, in partial remission: Secondary | ICD-10-CM | POA: Diagnosis not present

## 2023-04-17 DIAGNOSIS — F1090 Alcohol use, unspecified, uncomplicated: Secondary | ICD-10-CM | POA: Diagnosis not present

## 2023-04-17 DIAGNOSIS — F4310 Post-traumatic stress disorder, unspecified: Secondary | ICD-10-CM | POA: Diagnosis not present

## 2023-04-17 DIAGNOSIS — F1010 Alcohol abuse, uncomplicated: Secondary | ICD-10-CM

## 2023-04-17 DIAGNOSIS — Z720 Tobacco use: Secondary | ICD-10-CM

## 2023-04-17 NOTE — Progress Notes (Signed)
Virtual Visit via Video Note  I connected with Margaret Mccann Margaret Mccann on 04/17/23 at 10:00 AM EDT by a video enabled telemedicine application and verified that I am speaking with the correct person using two identifiers.  Location: Patient: Home Provider: Office   I discussed the limitations of evaluation and management by telemedicine and the availability of in person appointments. The patient expressed understanding and agreed to proceed.    I discussed the assessment and treatment Mccann with the patient. The patient was provided an opportunity to ask questions and all were answered. The patient agreed with the Mccann and demonstrated an understanding of the instructions.   The patient was advised to call back or seek an in-person evaluation if the symptoms worsen or if the condition fails to improve as anticipated.  I provided 71 minutes of non-face-to-face time during this encounter.   Lamar Sprinkles, MD  [facility] MD/PA/NP OP Progress Note  04/17/2023 11:21 AM Margaret Mccann Margaret Mccann  MRN:  [id number]  Chief Complaint:  Chief Complaint  Patient presents with   Anxiety   Follow-up   Medication Refill   Stress   HPI: Margaret "Higinio Mccann" Derrill Memo is a 52 year old female with a PPH of MDD, GAD, PTSD, cocaine use, tobacco use, EtOH use disorder.  Patient reports she has the following medication regimen:   Gabapentin 600 mg TID (Sometimes takes 4x per day. Difficult since dose change) Remeron 30 mg nightly (compliant) Pristiq 75 mg daily (compliant) Zyprexa 2.5 mg nightly (Taking when more than 2-3 hours to fall asleep, 4 x/ week) Hydroxyzine 50 mg 3 times daily (Requiring 3 times daily) Clonidine 0.2 mg nightly (compliant- BP skyrockets) Propranolol 10 mg BID (compliant) Melatonin 3 mg (discontinued 2-3 months ago) Drinks Sleepy Time tea, which helps sometimes. Tries to limit due to ingredients that may interact with her medications.  We initially discuss her medications more in-depth.  She reports she takes Zyprexa when her thoughts keep her up at night. She denies racing thoughts, but says that she has painful memories, then takes both Zyprexa and 2 Hydroxyzine pills to "drown out the thoughts" and sleep. She says that she only does this when there is a 2-3 hour lag of her going to sleep. This occurs between 2-4 nights per week. Had a suicide attempt in October via OD on clonidine, at which time she had her Gabapentin dosage decreased. Gabapentin taking 4x per day, 3-4 times per week.   Currently, she reports taking one in the morning and two at bedtime. When adding in the additional dose, she takes one in the afternoon. She "has to have 2 at bedtime to fall asleep, for over 10 years now." Clonidine, Mirtazapine, Gabapentin at bedtime is helpful for sleep most nights.   Bedtime routine: Dinner @ 7:30, watches TV or reads, takes a bath @ 8:45-9, medication @ 9:30, then reads or watches TV until she feels sleepy. Then, she turns her lamp off. When she has painful memories, and she is awake for 2-3 hours, she takes Olanzapine and 2 Vistaril approx. 20 minutes after noting inefficacy of deep breathing and progressive muscle relaxation. She does not take the Hydroxyzine first, then follow with Olanzapine. She says that many years ago, hydroxyzine alone did not work for her. She sometimes will cry, as it is cathartic and helps to make her tired.   She notes depending on this medication regimen after suicides of her sister and fiance. Additionally, Dad passed away and she lost 2 friends to OD.  She went to [facility] in [facility] 8 weeks, 10/2021-12/2021 x 2 (when sister and fiance passed). Program helped with sorrow and anxiety, both of which immediately returned with her return to reality. She continues to engage in meditation and some breath work. Beneficial in the present moment, but after an hour, anxiety comes back. She meditates 3-4 times per week. Her breath work now includes deep, relaxation  breathing when she feels a panic attack coming on, occurring 3x per week. This is because she has not identified a "breath work Radio broadcast assistant" in this area. Her anxiety comes on with lack of sleep (less than 6 hours per night, particularly over a 2 night span) and memories.  Since her last visit, she is sleeping better overall. She does continue to feel guilty about sister and fiance, being unable to save them. An additional stressor is that she is currently moving and looking for a new job. Her anxiety sx do not come on randomly, only when triggers remind her of her sister or fiance. Dog helps to comfort her; walks or cuddles with him, takes a bath or listens to soothing music. 50% of the time. Does yoga and other exercise, "helps better than my antidepressants." Walks dog daily, 1.5 miles, yoga 3x per week.   She no longer has night terrors; Prazosin was ineffective and Clonidine helps.  Has a lot of friends in 12-step program, and she reports going regularly. Last time doing drugs was before [facility] in 2023. Last drank alcohol last week. Drinks wine on occasion, but says that she has the desire to stop. She is asked about starting Naltrexone, but she is not willing to give a try. Alcohol helps with anxiety when medications do not work.  Desire to live. Knows she is here for a purpose. Denies SI, HI, AVH.   Open to therapy, would like to restart. Went to program on [place], but did not have a good experience.   Beneficial to have continuity with the same physician.      Visit Diagnosis:    ICD-10-CM   1. PTSD (post-traumatic stress disorder)  F43.10     2. Major depressive disorder, recurrent episode, in partial remission with anxious distress (HCC)  F33.41     3. Cocaine use disorder in remission  F14.91     4. Alcohol use disorder, mild, in controlled environment  F10.10        Past Psychiatric History:  NPT: 3x, multiple suicide attempts via overdose OPT: Yes Therapist: Previous,  interested in appt Geodon (tongue swell), Effexor, Prozac, Lamictal (SJS), Paxil, Cymbalta Likes Pristiq the best Hx of Klonopin, Trazodone, and Seroquel (all ineffective for sleep).   Last visit 10/2022-patient fixated on increasing her gabapentin and having the exact same medication regimen that she had a few years ago when she was in [place].  Was not willing to increase patient to 2400 mg gabapentin but did increase to 1800 mg. 12/2022- Patient endorsed good insight and does not appear to be decompensating despite recent loss of friend to substance use disorder. Patient is using her coping skills and support system appropriately  03/2023- Patient again endorsing good insight, and although anxious, has some improvements. Receptive to therapy. EPS noted, Zyprexa d/c.  Trauma history: -Found fianc after he hanged himself in 2022, she herself attempted suicide approximately 3 weeks later and was hospitalized -Witnessed her sister die of seizures after attempting suicide (eventually completing) via overdosing on Wellbutrin however, her sister endorse remorse but was not able to  be revived Past Medical History:  Past Medical History:  Diagnosis Date   Alcohol withdrawal seizure with complication, with unspecified complication (HCC) 12/23/2020   Anxiety    Bipolar 1 disorder (HCC)    Depression    Intentional drug overdose (HCC) 08/04/2020   Major depressive disorder, recurrent episode with anxious distress (HCC) 08/11/2020   Morgellons syndrome    MRSA (methicillin resistant Staphylococcus aureus)    Suicide attempt (HCC)    Suicide by drug overdose (HCC) 08/11/2020   Tricuspid valve regurgitation 12/18/2020    Past Surgical History:  Procedure Laterality Date   APPENDECTOMY      Family Psychiatric History: Sister - committed suicide in 2005 by overdosing on Wellbutrin     Family History:  Family History  Problem Relation Age of Onset   Heart attack Father 59   Stroke Father     Severe combined immunodeficiency Sister    Breast cancer Paternal Aunt    Ulcerative colitis Neg Hx    Esophageal cancer Neg Hx     Social History:  Social History   Socioeconomic History   Marital status: Single    Spouse name: Not on file   Number of children: Not on file   Years of education: Not on file   Highest education level: Not on file  Occupational History   Not on file  Tobacco Use   Smoking status: Some Days    Current packs/day: 0.25    Average packs/day: 0.3 packs/day for 5.0 years (1.3 ttl pk-yrs)    Types: Cigarettes   Smokeless tobacco: Never  Vaping Use   Vaping status: Never Used  Substance and Sexual Activity   Alcohol use: Yes    Comment: occas   Drug use: Not Currently    Types: Cocaine    Comment: relapsed the past weekend, no cocaine use prior 6 months   Sexual activity: Not Currently    Birth control/protection: Pill  Other Topics Concern   Not on file  Social History Narrative   ** Merged History Encounter **       Social Determinants of Health   Financial Resource Strain: Not on file  Food Insecurity: No Food Insecurity (07/18/2022)   Hunger Vital Sign    Worried About Running Out of Food in the Last Year: Never true    Ran Out of Food in the Last Year: Never true  Transportation Needs: No Transportation Needs (07/18/2022)   PRAPARE - Administrator, Civil Service (Medical): No    Lack of Transportation (Non-Medical): No  Physical Activity: Not on file  Stress: Not on file  Social Connections: Not on file    Allergies:  Allergies  Allergen Reactions   Lamictal [Lamotrigine] Anaphylaxis, Rash and Other (See Comments)    Stevens-Johnson syndrome and fevers, also   Lamictal [Lamotrigine] Anaphylaxis, Rash and Other (See Comments)    Fevers and Stevens-Johnson syndrome, also   Tramadol Other (See Comments)    Pt had a seizure after taking Tramadol!!   Geodon [Ziprasidone Hcl] Rash   Levetiracetam Anxiety     Metabolic Disorder Labs: Lab Results  Component Value Date   HGBA1C 6.0 (H) 01/12/2023   MPG 116.89 07/20/2022   MPG 114.02 12/19/2020   No results found for: "PROLACTIN" Lab Results  Component Value Date   CHOL 199 07/20/2022   TRIG 117 07/20/2022   HDL 58 07/20/2022   CHOLHDL 3.4 07/20/2022   VLDL 23 07/20/2022   LDLCALC 118 (H) 07/20/2022  LDLCALC 69 05/16/2021   Lab Results  Component Value Date   TSH 3.544 07/20/2022   TSH 1.300 05/16/2021    Therapeutic Level Labs: No results found for: "LITHIUM" No results found for: "VALPROATE" No results found for: "CBMZ"  Current Medications: Current Outpatient Medications  Medication Sig Dispense Refill   atorvastatin (LIPITOR) 20 MG tablet Take 1 tablet (20 mg total) by mouth at bedtime. 90 tablet 0   cloNIDine (CATAPRES) 0.2 MG tablet Take 1 tablet (0.2 mg total) by mouth at bedtime. 30 tablet 3   desvenlafaxine (PRISTIQ) 25 MG 24 hr tablet Take 3 tablets (75 mg total) by mouth daily. 90 tablet 3   hydrOXYzine (ATARAX) 50 MG tablet Take 1 tablet (50 mg total) by mouth 3 (three) times daily as needed for anxiety. 90 tablet 3   mirtazapine (REMERON SOL-TAB) 30 MG disintegrating tablet Take 1 tablet (30 mg total) by mouth at bedtime. 30 tablet 3   norethindrone (INCASSIA) 0.35 MG tablet Take 1 tablet (0.35 mg total) by mouth daily. 28 tablet 2   pantoprazole (PROTONIX) 40 MG tablet Take 1 tablet (40 mg total) by mouth 2 (two) times daily before a meal. 60 tablet 2   propranolol (INDERAL) 10 MG tablet Take 1 tablet (10 mg total) by mouth 2 (two) times daily. 60 tablet 3   valsartan (DIOVAN) 80 MG tablet Take 1 tablet (80 mg total) by mouth daily. 90 tablet 1   melatonin 3 MG TABS tablet Take 1 tablet (3 mg total) by mouth at bedtime. (Patient not taking: Reported on 04/17/2023)  0   No current facility-administered medications for this visit.     Psychiatric Specialty Exam: Review of Systems  Psychiatric/Behavioral:   Negative for dysphoric mood, hallucinations and suicidal ideas. The patient is nervous/anxious.     Last menstrual period 10/27/2019.There is no height or weight on file to calculate BMI.  General Appearance: Casual  Eye Contact:  Good  Speech:  Clear and Coherent  Volume:  Normal  Mood:  Euthymic  Affect:  Appropriate  Thought Process:  Coherent  Orientation:  Full (Time, Place, and Person)  Thought Content: Logical   Suicidal Thoughts:  No  Homicidal Thoughts:  No  Memory:  Immediate;   Good Recent;   Good  Judgement:  Good  Insight:  Good  Psychomotor Activity:  Normal  Concentration:  Concentration: Good  Recall:  Good  Fund of Knowledge: Good  Language: Good  Akathisia:  No  Handed:    AIMS (if indicated): not done  Assets:  Communication Skills Desire for Improvement Housing Leisure Time Resilience Social Support  ADL's:  Intact  Cognition: WNL  Sleep:  Good   Screenings: AIMS    Flowsheet Row [facility]  AIMS Total Score 0      AUDIT    Flowsheet Row [facility] Ad[facility] ([facility] Ad[facility] ([facility]  Alcohol Use Disorder Identification Test Final Score (AUDIT) 6 11 0      GAD-7    Flowsheet Row Office Visit from 01/09/2023 in [facility] Video Visit from 07/31/2022 in [facility] Video Visit from 06/15/2022 in [facility] Office Visit from 04/24/2022 in [facility] Video Visit from 03/23/2022  in [facility]  Total GAD-7 Score 11 9 4 3 2       PHQ2-9    Flowsheet Row Office Visit from 01/09/2023 in [facility] Visit from [facility]/20[facility] Visit from 06/15/2022 in [facility] Office Visit from 04/24/2022 in [facility] Video Visit from 03/23/2022 in [facility]  PHQ-2 Total Score 0 1 0 0 0  PHQ-9 Total Score -- 6 0 -- 1      Flowsheet Row Ad[facility]) [facility] ED from 01/12/2022 in [facility] Video Visit from 08/09/2021 in [facility]  C-SSRS RISK CATEGORY No Risk No Risk No Risk        Assessment and Mccann: Patient continues to do fairly well, able to maintain sobriety from drugs, but not yet fully ready to discontinue alcohol consumption. She is still struggling with intrusive thoughts, anxiety, and rumination around her trauma that is worse at night. We were able to identify that the missing piece is that she has not participated in therapy since returning home from [facility]. She is open to receiving therapy. There was discussion about misuse of Gabapentin, with frequent early fills, as noted on PDMP. She was given firm guidance that as she has been misusing, I am unable to consider increasing to QID dosing. Will continue to prescribe TID. She voiced understanding. Additionally, she voiced understanding that I am unable to  consider increasing the dosage as she has not resumed therapy to address the root of her problems. Referral placed for therapy within our clinic.   Involuntary perioral movements noted around patient's mouth. She was advised to discontinue Zyprexa at this time to assess whether these movements will resolve. She was agreeable, advising that the same occurred with taking and discontinuing Seroquel, respectively. Will follow-up in person in approx. 4 weeks, to which she is agreeable. Other  medications to be continued as prescribed.    Chronic PTSD MDD in remission GAD - Discontinue Zyprexa 2.5 mg due to EPS and lack of indication - Continue Pristiq 75 mg daily - Continue gabapentin to 600 mg 3 times daily - Continue hydroxyzine 50 mg 3 times daily PRN - Continue Remeron 30 mg nightly - Continue clonidine 0.2 mg nightly - Continue Propanolol 10mg  BID - Referral to therapy   Primary hypertension - Propranolol 10 mg BID -- valsartan 80mg  daily   Hx EtOH use disorder, severe - Gabapentin per above  Return to clinic, in-person, in approx. 4 weeks.   Collaboration of Care: Collaboration of Care:   Patient/Guardian was advised Release of Information must be obtained prior to any record release in order to collaborate their care with an outside provider. Patient/Guardian was advised if they have not already done so to contact the registration department to sign all necessary forms in order for [place] to release information regarding their care.   Consent: Patient/Guardian gives verbal consent for treatment and assignment of benefits for services provided during this visit. Patient/Guardian expressed understanding and agreed to proceed.   PGY-3 Lamar Sprinkles, MD 04/17/2023 11:21 AM

## 2023-04-18 ENCOUNTER — Other Ambulatory Visit: Payer: Self-pay

## 2023-04-18 ENCOUNTER — Other Ambulatory Visit (HOSPITAL_COMMUNITY): Payer: Self-pay

## 2023-04-18 MED ORDER — GABAPENTIN 600 MG PO TABS
600.0000 mg | ORAL_TABLET | Freq: Three times a day (TID) | ORAL | 0 refills | Status: DC
  Filled 2023-04-18 – 2023-05-07 (×2): qty 90, 30d supply, fill #0

## 2023-04-19 ENCOUNTER — Other Ambulatory Visit: Payer: Self-pay

## 2023-04-19 ENCOUNTER — Other Ambulatory Visit: Payer: Self-pay | Admitting: Critical Care Medicine

## 2023-04-19 MED ORDER — PANTOPRAZOLE SODIUM 40 MG PO TBEC
40.0000 mg | DELAYED_RELEASE_TABLET | Freq: Two times a day (BID) | ORAL | 1 refills | Status: DC
  Filled 2023-04-19: qty 60, 30d supply, fill #0
  Filled 2023-05-18: qty 60, 30d supply, fill #1

## 2023-04-20 ENCOUNTER — Other Ambulatory Visit: Payer: Self-pay

## 2023-04-23 ENCOUNTER — Other Ambulatory Visit: Payer: Self-pay

## 2023-04-27 ENCOUNTER — Other Ambulatory Visit: Payer: Self-pay

## 2023-05-01 ENCOUNTER — Other Ambulatory Visit: Payer: Self-pay

## 2023-05-07 ENCOUNTER — Other Ambulatory Visit: Payer: Self-pay

## 2023-05-09 ENCOUNTER — Encounter (HOSPITAL_COMMUNITY): Payer: No Payment, Other | Admitting: Student

## 2023-05-10 ENCOUNTER — Other Ambulatory Visit: Payer: Self-pay

## 2023-05-17 ENCOUNTER — Other Ambulatory Visit: Payer: Self-pay

## 2023-05-18 ENCOUNTER — Other Ambulatory Visit: Payer: Self-pay

## 2023-05-23 ENCOUNTER — Other Ambulatory Visit: Payer: Self-pay

## 2023-05-24 ENCOUNTER — Other Ambulatory Visit: Payer: Self-pay

## 2023-05-24 ENCOUNTER — Other Ambulatory Visit (HOSPITAL_COMMUNITY): Payer: Self-pay

## 2023-05-29 ENCOUNTER — Other Ambulatory Visit: Payer: Self-pay

## 2023-05-29 ENCOUNTER — Encounter (HOSPITAL_COMMUNITY): Payer: No Payment, Other | Admitting: Student

## 2023-05-31 ENCOUNTER — Encounter (HOSPITAL_COMMUNITY): Payer: No Payment, Other | Admitting: Student

## 2023-05-31 ENCOUNTER — Other Ambulatory Visit: Payer: Self-pay

## 2023-06-04 ENCOUNTER — Other Ambulatory Visit (HOSPITAL_COMMUNITY): Payer: Self-pay

## 2023-06-04 ENCOUNTER — Encounter: Payer: Self-pay | Admitting: Nurse Practitioner

## 2023-06-04 ENCOUNTER — Ambulatory Visit (INDEPENDENT_AMBULATORY_CARE_PROVIDER_SITE_OTHER): Payer: Self-pay | Admitting: Nurse Practitioner

## 2023-06-04 ENCOUNTER — Ambulatory Visit (INDEPENDENT_AMBULATORY_CARE_PROVIDER_SITE_OTHER): Payer: No Payment, Other | Admitting: Psychiatry

## 2023-06-04 ENCOUNTER — Other Ambulatory Visit: Payer: Self-pay

## 2023-06-04 ENCOUNTER — Encounter (HOSPITAL_COMMUNITY): Payer: Self-pay | Admitting: Psychiatry

## 2023-06-04 VITALS — BP 144/101 | HR 78 | Temp 97.1°F | Wt 143.0 lb

## 2023-06-04 DIAGNOSIS — R110 Nausea: Secondary | ICD-10-CM | POA: Insufficient documentation

## 2023-06-04 DIAGNOSIS — F411 Generalized anxiety disorder: Secondary | ICD-10-CM

## 2023-06-04 DIAGNOSIS — I10 Essential (primary) hypertension: Secondary | ICD-10-CM

## 2023-06-04 DIAGNOSIS — R109 Unspecified abdominal pain: Secondary | ICD-10-CM | POA: Insufficient documentation

## 2023-06-04 DIAGNOSIS — F3341 Major depressive disorder, recurrent, in partial remission: Secondary | ICD-10-CM | POA: Diagnosis not present

## 2023-06-04 MED ORDER — CLONIDINE HCL 0.2 MG PO TABS
0.2000 mg | ORAL_TABLET | Freq: Every day | ORAL | 3 refills | Status: DC
Start: 2023-06-04 — End: 2023-08-14
  Filled 2023-06-04 (×2): qty 30, 30d supply, fill #0
  Filled 2023-06-25 – 2023-06-30 (×2): qty 30, 30d supply, fill #1
  Filled 2023-07-31: qty 30, 30d supply, fill #2

## 2023-06-04 MED ORDER — DESVENLAFAXINE SUCCINATE ER 25 MG PO TB24
75.0000 mg | ORAL_TABLET | Freq: Every day | ORAL | 3 refills | Status: DC
Start: 2023-06-04 — End: 2023-10-10
  Filled 2023-06-04 – 2023-06-25 (×2): qty 90, 30d supply, fill #0
  Filled 2023-07-31 – 2023-08-01 (×2): qty 90, 30d supply, fill #1
  Filled 2023-08-27: qty 60, 20d supply, fill #2
  Filled 2023-08-28: qty 30, 10d supply, fill #2
  Filled 2023-09-25 – 2023-09-27 (×2): qty 90, 30d supply, fill #3

## 2023-06-04 MED ORDER — GABAPENTIN 600 MG PO TABS
600.0000 mg | ORAL_TABLET | Freq: Three times a day (TID) | ORAL | 1 refills | Status: DC
  Filled 2023-06-04 (×2): qty 90, 30d supply, fill #0
  Filled 2023-06-30: qty 90, 30d supply, fill #1

## 2023-06-04 MED ORDER — PROMETHAZINE HCL 12.5 MG PO TABS
12.5000 mg | ORAL_TABLET | Freq: Three times a day (TID) | ORAL | 0 refills | Status: DC | PRN
Start: 2023-06-04 — End: 2023-06-04
  Filled 2023-06-04: qty 20, 7d supply, fill #0

## 2023-06-04 MED ORDER — HYDROXYZINE HCL 50 MG PO TABS
50.0000 mg | ORAL_TABLET | Freq: Three times a day (TID) | ORAL | 3 refills | Status: DC | PRN
Start: 2023-06-04 — End: 2023-09-25
  Filled 2023-06-04 (×2): qty 90, 30d supply, fill #0
  Filled 2023-07-01: qty 90, 30d supply, fill #1
  Filled 2023-07-31: qty 90, 30d supply, fill #2
  Filled 2023-08-27: qty 90, 30d supply, fill #3

## 2023-06-04 MED ORDER — PROPRANOLOL HCL 10 MG PO TABS
10.0000 mg | ORAL_TABLET | Freq: Two times a day (BID) | ORAL | 3 refills | Status: DC
  Filled 2023-06-30: qty 60, 30d supply, fill #0
  Filled 2023-07-31 – 2023-08-01 (×3): qty 60, 30d supply, fill #1

## 2023-06-04 MED ORDER — PROMETHAZINE HCL 12.5 MG PO TABS
12.5000 mg | ORAL_TABLET | Freq: Three times a day (TID) | ORAL | 0 refills | Status: DC | PRN
Start: 2023-06-04 — End: 2023-06-12
  Filled 2023-06-04: qty 20, 7d supply, fill #0

## 2023-06-04 MED ORDER — GABAPENTIN 600 MG PO TABS
600.0000 mg | ORAL_TABLET | Freq: Three times a day (TID) | ORAL | 0 refills | Status: DC
  Filled 2023-06-04: qty 90, 30d supply, fill #0

## 2023-06-04 MED ORDER — MIRTAZAPINE 45 MG PO TBDP
45.0000 mg | ORAL_TABLET | Freq: Every day | ORAL | 3 refills | Status: DC
Start: 2023-06-04 — End: 2023-09-25
  Filled 2023-06-04: qty 30, 30d supply, fill #0
  Filled 2023-07-01: qty 30, 30d supply, fill #1
  Filled 2023-07-31 – 2023-08-01 (×2): qty 30, 30d supply, fill #2
  Filled 2023-08-27: qty 30, 30d supply, fill #3

## 2023-06-04 NOTE — Patient Instructions (Signed)
1. Acute abdominal pain  - promethazine (PHENERGAN) 12.5 MG tablet; Take 1 tablet (12.5 mg total) by mouth every 8 (eight) hours as needed for nausea or vomiting.  Dispense: 20 tablet; Refill: 0   I am worried about your abdominal pain and would like for you to go to the emergency room right away, please do not delay in going  for your safety is very important   It is important that you exercise regularly at least 30 minutes 5 times a week as tolerated  Think about what you will eat, plan ahead. Choose " clean, green, fresh or frozen" over canned, processed or packaged foods which are more sugary, salty and fatty. 70 to 75% of food eaten should be vegetables and fruit. Three meals at set times with snacks allowed between meals, but they must be fruit or vegetables. Aim to eat over a 12 hour period , example 7 am to 7 pm, and STOP after  your last meal of the day. Drink water,generally about 64 ounces per day, no other drink is as healthy. Fruit juice is best enjoyed in a healthy way, by EATING the fruit.  Thanks for choosing [facility] we consider it a privelige to serve you.

## 2023-06-04 NOTE — Progress Notes (Signed)
Acute Office Visit  Subjective:     Patient ID: Margaret Mccann, female    DOB: 07-25-1971, 52 y.o.   MRN: [id number]  Chief Complaint  Patient presents with   Abdominal Pain    Scale is 10 started thursday    Abdominal Pain Associated symptoms include nausea. Pertinent negatives include no diarrhea, fever or vomiting.   Margaret Mccann  has a past medical history of Alcohol withdrawal seizure with complication, with unspecified complication (HCC) (12/23/2020), Anxiety, Bipolar 1 disorder (HCC), Depression, Intentional drug overdose (HCC) (08/04/2020), Major depressive disorder, recurrent episode with anxious distress (HCC) (08/11/2020), Morgellons syndrome, MRSA (methicillin resistant Staphylococcus aureus), Suicide attempt (HCC), Suicide by drug overdose (HCC) (08/11/2020), and Tricuspid valve regurgitation (12/18/2020). Patient of Dr Delford Field.  Patient presents for complaints of abdominal pain rated 10/10 that started 4 days ago.  Stated that she never had this type of pain before.  Pain started in her epigastric area and radiates down the left side to her pelvic area.  Has squeezing stabbing pain.  Patient reports nausea but denies fever, chills diarrhea vomiting.  Takes pantoprazole for acid reflux.  We offered to take the patient to the emergency room at [facility] but the patient states that she wants to go home first so she can let her dog out and get home ordered.  I told this patient that this may not be a safe decision for her and that she should go to the emergency room right away but  she insisted on going home first before going to the emergency room.  She asked for Phenergan for nausea.   Review of Systems  Constitutional:  Negative for chills, fever and unexpected weight change.  Respiratory: Negative.  Negative for apnea, cough, choking, chest tightness and shortness of breath.   Cardiovascular: Negative.  Negative for chest pain, palpitations and leg swelling.   Gastrointestinal:  Positive for abdominal pain and nausea. Negative for anal bleeding, blood in stool, diarrhea and vomiting.  Musculoskeletal: Negative.   Skin: Negative.   Neurological:  Negative for seizures, facial asymmetry and speech difficulty.  Psychiatric/Behavioral:  Negative for behavioral problems, confusion and decreased concentration.         Objective:    BP (!) 144/101   Pulse 78   Temp (!) 97.1 F (36.2 C)   Wt 143 lb (64.9 kg)   LMP 10/27/2019   SpO2 100%   BMI 25.33 kg/m    Physical Exam Vitals and nursing note reviewed.  Constitutional:      General: She is in acute distress.     Appearance: She is not ill-appearing, toxic-appearing or diaphoretic.     Comments: Patient is tearful  HENT:     Mouth/Throat:     Mouth: Mucous membranes are moist.     Pharynx: Oropharynx is clear. No oropharyngeal exudate or posterior oropharyngeal erythema.  Eyes:     General: No scleral icterus.       Right eye: No discharge.        Left eye: No discharge.     Extraocular Movements: Extraocular movements intact.     Conjunctiva/sclera: Conjunctivae normal.  Cardiovascular:     Rate and Rhythm: Normal rate and regular rhythm.     Pulses: Normal pulses.     Heart sounds: Normal heart sounds. No murmur heard.    No friction rub. No gallop.  Pulmonary:     Effort: Pulmonary effort is normal. No respiratory distress.     Breath  sounds: Normal breath sounds. No stridor. No wheezing, rhonchi or rales.  Chest:     Chest wall: No tenderness.  Abdominal:     Palpations: Abdomen is soft.     Tenderness: There is abdominal tenderness.     Comments: Unable to fully palpate the abdomen due to tenderness , patient holding on to her abdomen  Musculoskeletal:        General: No swelling, tenderness, deformity or signs of injury.     Right lower leg: No edema.     Left lower leg: No edema.  Skin:    General: Skin is warm and dry.     Capillary Refill: Capillary refill takes  less than 2 seconds.     Coloration: Skin is not jaundiced or pale.     Findings: No bruising, erythema or lesion.  Neurological:     Mental Status: She is alert and oriented to person, place, and time.     Motor: No weakness.     Coordination: Coordination normal.     Gait: Gait normal.  Psychiatric:        Judgment: Judgment normal.     No results found for any visits on 06/04/23.      Assessment & Plan:   Problem List Items Addressed This Visit       Other   Nausea    1. Acute abdominal pain  - promethazine (PHENERGAN) 12.5 MG tablet; Take 1 tablet (12.5 mg total) by mouth every 8 (eight) hours as needed for nausea or vomiting.  Dispense: 20 tablet; Refill: 0  2. Nausea  - promethazine (PHENERGAN) 12.5 MG tablet; Take 1 tablet (12.5 mg total) by mouth every 8 (eight) hours as needed for nausea or vomiting.  Dispense: 20 tablet; Refill: 0   Patient encouraged to go to the emergency room for further evaluation But She plans on going home first before going to the emergency room.       Relevant Medications   promethazine (PHENERGAN) 12.5 MG tablet   Acute abdominal pain - Primary    1. Acute abdominal pain  - promethazine (PHENERGAN) 12.5 MG tablet; Take 1 tablet (12.5 mg total) by mouth every 8 (eight) hours as needed for nausea or vomiting.  Dispense: 20 tablet; Refill: 0  2. Nausea  - promethazine (PHENERGAN) 12.5 MG tablet; Take 1 tablet (12.5 mg total) by mouth every 8 (eight) hours as needed for nausea or vomiting.  Dispense: 20 tablet; Refill: 0   Patient encouraged to go to the emergency room for further evaluation But She plans on going home first before going to the emergency room.      Relevant Medications   promethazine (PHENERGAN) 12.5 MG tablet    Meds ordered this encounter  Medications   DISCONTD: promethazine (PHENERGAN) 12.5 MG tablet    Sig: Take 1 tablet (12.5 mg total) by mouth every 8 (eight) hours as needed for nausea or vomiting.     Dispense:  20 tablet    Refill:  0   promethazine (PHENERGAN) 12.5 MG tablet    Sig: Take 1 tablet (12.5 mg total) by mouth every 8 (eight) hours as needed for nausea or vomiting.    Dispense:  20 tablet    Refill:  0    No follow-ups on file.  Donell Beers, FNP

## 2023-06-04 NOTE — Progress Notes (Signed)
BH MD/PA/NP OP Progress Note    06/04/2023 8:50 AM Jackolyn Confer Zenola Vroman  MRN:  [id number]  Chief Complaint: "I am in so much pain"   HPI: 52 year old female seen today for follow-up psychiatric evaluation.  She has a psychiatric history of depression, PTSD, GAD, substance use (tobacco, cocaine, alcohol).  Currently she is managed on gabapentin 600 mg three times daily, Remeron 30 mg nightly, Pristiq 75 mg daily,  hydroxyzine 50 mg 3 times daily, propanolol 10 mg twice daily and clonidine 0.2 mg nightly. She notes her medications a somewhat effective in managing her psychiatric conditions.  Today she is well-groomed, pleasant, cooperative, engaged in conversation, maintained eye contact.  Patient informed Clinical research associate that she is in so much pain.  Throughout the exam she is rocking and grimacing.  She informed writer that her abdomen hurts and quantifies her pain as 9 out of 10.  She notes that this has been going on since last week Thursday.  She describes it as a squeezing spasm.  Provider assessed patient's abdomen and she has the slight distention on her left side.  Provider encouraged patient to follow-up with her PCP today.  She notes that she has called him and will walk into the clinic after her mental health appointment.    Since her last visit she informed writer that she continues to be increasingly anxious.  She request that gabapentin be increased to 600 mg 4 times daily.  Provider informed patient that this would not occur as her gabapentin dose is currently high.  Provider did inform patient that mirtazapine could be increased to 45 mg to help manage anxiety and depression.  She endorsed understanding and agreed.  Patient informed writer that her abdominal pain is affecting her appetite.  She notes that her appetite is poor but denies weight loss.  She also informed writer that her abdominal pain is affecting her sleep and notes that last night she only got 2 hours of sleep.  Today provider  conducted a GAD-7 and patient scored a 12.  Provider also conducted PHQ-9 and patient scored a 10.  Today she denies SI/HI/AVH, mania, paranoia.    Provider asked patient if she was misusing any of her prescribed medication or any illegal substances.  She notes that she is taking her medication as prescribed but notes that she spilled her clonidine bottle inside her bathtub.  She requested a refill of this today.  Provider was agreeable.  Patient's blood pressure is notably elevated today at 114/99.  Provider attempted a second time and it read 129/106.  She denies illegal drug use and notes that she has not drank alcohol in over 3 weeks.    Today mirtazapine 30 mg increased to 45 mg to help manage anxiety, depression, and sleep.  Provider recommended [facility].  Provider encouraged patient to go to the urgent care or ED if she cannot get into the clinic today.  She endorsed understanding and agreed.  She will continue all her other medications as prescribed.  No other concerns noted at this time.    Visit Diagnosis:    ICD-10-CM   1. GAD (generalized anxiety disorder)  F41.1 mirtazapine (REMERON SOL-TAB) 45 MG disintegrating tablet    hydrOXYzine (ATARAX) 50 MG tablet    desvenlafaxine (PRISTIQ) 25 MG 24 hr tablet    2. Major depressive disorder, recurrent episode, in partial remission with anxious distress (HCC)  F33.41  mirtazapine (REMERON SOL-TAB) 45 MG disintegrating tablet    hydrOXYzine (ATARAX) 50 MG tablet    desvenlafaxine (PRISTIQ) 25 MG 24 hr tablet    3. Primary hypertension  I10 cloNIDine (CATAPRES) 0.2 MG tablet    propranolol (INDERAL) 10 MG tablet      Past Psychiatric History:  MDD , anxiety, PTSD, Substance abuse (cocaine, alcohol)  Past Medical History:  Past Medical History:  Diagnosis Date   Alcohol withdrawal seizure with complication, with unspecified complication (HCC)  12/23/2020   Anxiety    Bipolar 1 disorder (HCC)    Depression    Intentional drug overdose (HCC) 08/04/2020   Major depressive disorder, recurrent episode with anxious distress (HCC) 08/11/2020   Morgellons syndrome    MRSA (methicillin resistant Staphylococcus aureus)    Suicide attempt (HCC)    Suicide by drug overdose (HCC) 08/11/2020   Tricuspid valve regurgitation 12/18/2020    Past Surgical History:  Procedure Laterality Date   APPENDECTOMY      Family Psychiatric History: Sister - committed suicide in 2005 by overdosing.  Family History:  Family History  Problem Relation Age of Onset   Heart attack Father 80   Stroke Father    Severe combined immunodeficiency Sister    Breast cancer Paternal Aunt    Ulcerative colitis Neg Hx    Esophageal cancer Neg Hx     Social History:  Social History   Socioeconomic History   Marital status: Single    Spouse name: Not on file   Number of children: Not on file   Years of education: Not on file   Highest education level: Not on file  Occupational History   Not on file  Tobacco Use   Smoking status: Some Days    Current packs/day: 0.25    Average packs/day: 0.3 packs/day for 5.0 years (1.3 ttl pk-yrs)    Types: Cigarettes   Smokeless tobacco: Never  Vaping Use   Vaping status: Never Used  Substance and Sexual Activity   Alcohol use: Yes    Comment: occas   Drug use: Not Currently    Types: Cocaine    Comment: relapsed the past weekend, no cocaine use prior 6 months   Sexual activity: Not Currently    Birth control/protection: Pill  Other Topics Concern   Not on file  Social History Narrative   ** Merged History Encounter **       Social Determinants of Health   Financial Resource Strain: Not on file  Food Insecurity: No Food Insecurity (07/18/2022)   Hunger Vital Sign    Worried About Running Out of Food in the Last Year: Never true    Ran Out of Food in the Last Year: Never true  Transportation Needs: No  Transportation Needs (07/18/2022)   PRAPARE - Administrator, Civil Service (Medical): No    Lack of Transportation (Non-Medical): No  Physical Activity: Not on file  Stress: Not on file  Social Connections: Not on file    Allergies:  Allergies  Allergen Reactions   Lamictal [Lamotrigine] Anaphylaxis, Rash and Other (See Comments)    Stevens-Johnson syndrome and fevers, also   Lamictal [Lamotrigine] Anaphylaxis, Rash and Other (See Comments)    Fevers and Stevens-Johnson syndrome, also   Tramadol Other (See Comments)    Pt had a seizure after taking Tramadol!!   Geodon [Ziprasidone Hcl] Rash   Levetiracetam Anxiety    Metabolic Disorder Labs: Lab Results  Component Value Date   HGBA1C  6.0 (H) 01/12/2023   MPG 116.89 07/20/2022   MPG 114.02 12/19/2020   No results found for: "PROLACTIN" Lab Results  Component Value Date   CHOL 199 07/20/2022   TRIG 117 07/20/2022   HDL 58 07/20/2022   CHOLHDL 3.4 07/20/2022   VLDL 23 07/20/2022   LDLCALC 118 (H) 07/20/2022   LDLCALC 69 05/16/2021   Lab Results  Component Value Date   TSH 3.544 07/20/2022   TSH 1.300 05/16/2021    Therapeutic Level Labs: No results found for: "LITHIUM" No results found for: "VALPROATE" No results found for: "CBMZ"  Current Medications: Current Outpatient Medications  Medication Sig Dispense Refill   atorvastatin (LIPITOR) 20 MG tablet Take 1 tablet (20 mg total) by mouth at bedtime. 90 tablet 0   cloNIDine (CATAPRES) 0.2 MG tablet Take 1 tablet (0.2 mg total) by mouth at bedtime. 30 tablet 3   desvenlafaxine (PRISTIQ) 25 MG 24 hr tablet Take 3 tablets (75 mg total) by mouth daily. 90 tablet 3   gabapentin (NEURONTIN) 600 MG tablet Take 1 tablet (600 mg total) by mouth 3 (three) times daily. 90 tablet 1   hydrOXYzine (ATARAX) 50 MG tablet Take 1 tablet (50 mg total) by mouth 3 (three) times daily as needed for anxiety. 90 tablet 3   melatonin 3 MG TABS tablet Take 1 tablet (3 mg  total) by mouth at bedtime. (Patient not taking: Reported on 04/17/2023)  0   mirtazapine (REMERON SOL-TAB) 45 MG disintegrating tablet Take 1 tablet (45 mg total) by mouth at bedtime. 30 tablet 3   norethindrone (INCASSIA) 0.35 MG tablet Take 1 tablet (0.35 mg total) by mouth daily. 28 tablet 2   pantoprazole (PROTONIX) 40 MG tablet Take 1 tablet (40 mg total) by mouth 2 (two) times daily before a meal. 60 tablet 1   propranolol (INDERAL) 10 MG tablet Take 1 tablet (10 mg total) by mouth 2 (two) times daily. 60 tablet 3   valsartan (DIOVAN) 80 MG tablet Take 1 tablet (80 mg total) by mouth daily. 90 tablet 1   No current facility-administered medications for this visit.     Musculoskeletal: Strength & Muscle Tone: within normal limits Gait & Station: normal Patient leans: N/A  Psychiatric Specialty Exam: Review of Systems  Last menstrual period 10/27/2019.There is no height or weight on file to calculate BMI.  General Appearance: Well Groomed  Eye Contact:  Good  Speech:  Clear and Coherent and Normal Rate  Volume:  Normal  Mood:  Anxious and Depressed  Affect:  Appropriate and Congruent  Thought Process:  Coherent, Goal Directed, and Linear  Orientation:  Full (Time, Place, and Person)  Thought Content: WDL and Logical   Suicidal Thoughts:  No  Homicidal Thoughts:  No  Memory:  Immediate;   Good Recent;   Good Remote;   Good  Judgement:  Good  Insight:  Good  Psychomotor Activity:  Normal  Concentration:  Concentration: Good and Attention Span: Good  Recall:  Good  Fund of Knowledge: Good  Language: Good  Akathisia:  No  Handed:  Right  AIMS (if indicated): not done  Assets:  Communication Skills Desire for Improvement Financial Resources/Insurance Housing  ADL's:  Intact  Cognition: WNL  Sleep:  Poor   Screenings: AIMS    Flowsheet Row [facility]  AIMS Total Score 0      AUDIT     Flowsheet Row Ad[facility]) [facility] Ad[facility] ([facility] Ad[facility] ([facility]  Alcohol Use Disorder Identification Test Final Score (AUDIT) 6 11 0      GAD-7    Flowsheet Row Clinical Support from 06/04/2023 in [facility] Office Visit from 01/09/2023 in [facility] Video Visit from 07/31/2022 in [facility] Video Visit from 06/15/2022 in [facility] Office Visit from 04/24/2022 in [facility]  Total GAD-7 Score 12 11 9 4 3       PHQ2-9    Flowsheet Row Clinical Support from 06/04/2023 in [facility] Office Visit from 01/09/2023 in [facility] Video Visit from 07/31/2022 in [facility] Video Visit from 06/15/2022 in [facility] Office Visit from 04/24/2022 in [facility]  PHQ-2 Total Score 0 0 1 0 0  PHQ-9 Total Score 10 -- 6 0 --      Flowsheet Row Ad[facility]) [facility] ED from 01/12/2022 in [facility] Video Visit from 08/09/2021 in [facility]  C-SSRS RISK CATEGORY No Risk No Risk No Risk        Assessment and Plan: Patient endorses increased anxiety, depression, and insomnia.  Patient also notes that she is in severe pain.  She quantifies her abdominal pain as 9 out of 10.Today mirtazapine 30 mg increased to 45 mg to help manage anxiety, depression, and sleep.  Provider recommended patient [facility].  Provider encouraged patient to go to the urgent care or ED if she cannot get into the clinic today.  She endorsed understanding and agreed.  She will continue all her other medications as prescribed.   1. GAD (generalized anxiety disorder)  Increased- mirtazapine (REMERON SOL-TAB) 45 MG disintegrating tablet; Take 1 tablet (45 mg total) by mouth at bedtime.  Dispense: 30 tablet; Refill: 3 Continue- hydrOXYzine (ATARAX) 50 MG tablet; Take 1 tablet (50 mg total) by mouth 3 (three) times daily as needed for anxiety.  Dispense: 90 tablet; Refill: 3 Continue- desvenlafaxine (PRISTIQ) 25 MG 24 hr tablet; Take 3 tablets (75 mg total) by mouth daily.  Dispense: 90 tablet; Refill: 3  2. Major depressive disorder, recurrent episode, in partial remission with anxious distress (HCC)  Increased- mirtazapine (REMERON SOL-TAB) 45 MG disintegrating tablet; Take 1 tablet (45 mg total) by mouth at bedtime.  Dispense: 30 tablet; Refill: 3 Continue- hydrOXYzine (ATARAX) 50 MG tablet; Take 1 tablet (50 mg total) by mouth 3 (three) times daily as needed for anxiety.  Dispense: 90 tablet; Refill: 3 Continue- desvenlafaxine (PRISTIQ) 25 MG 24 hr tablet; Take 3 tablets (75 mg total) by mouth daily.  Dispense: 90 tablet; Refill: 3  3. Primary hypertension  Continue- cloNIDine (CATAPRES) 0.2 MG tablet; Take 1 tablet (0.2 mg total) by mouth at bedtime.  Dispense: 30 tablet; Refill: 3 Continue- propranolol (INDERAL) 10 MG tablet; Take 1 tablet (10 mg total) by mouth 2 (two) times daily.  Dispense: 60 tablet; Refill: 3    Follow up in 3 months  Shanna Cisco, NP 06/04/2023, 8:50 AM

## 2023-06-04 NOTE — Assessment & Plan Note (Signed)
1. Acute abdominal pain  - promethazine (PHENERGAN) 12.5 MG tablet; Take 1 tablet (12.5 mg total) by mouth every 8 (eight) hours as needed for nausea or vomiting.  Dispense: 20 tablet; Refill: 0  2. Nausea  - promethazine (PHENERGAN) 12.5 MG tablet; Take 1 tablet (12.5 mg total) by mouth every 8 (eight) hours as needed for nausea or vomiting.  Dispense: 20 tablet; Refill: 0   Patient encouraged to go to the emergency room for further evaluation But She plans on going home first before going to the emergency room.

## 2023-06-05 ENCOUNTER — Other Ambulatory Visit: Payer: Self-pay

## 2023-06-05 ENCOUNTER — Ambulatory Visit: Payer: Self-pay | Admitting: Nurse Practitioner

## 2023-06-06 ENCOUNTER — Other Ambulatory Visit: Payer: Self-pay

## 2023-06-10 NOTE — Progress Notes (Unsigned)
Established Patient Office Visit  Subjective:  Patient ID: Margaret Mccann, female    DOB: 1971-06-19  Age: 52 y.o. MRN: [id number]  Chief Complaint  Patient presents with   Nausea   Medication Refill     HPI  04/2021   Referred for HTN mgmt and PCP to est by [facility] [name] This patient has a history of PTSD substance use prior suicide attempts, major depression and severe anxiety.  The patient was hospitalized in March with hypoxic respiratory failure and polysubstance overdose.  Patient then went to rehab for a 1-month interval.  Since that time she is in a better mental health state.  Patient is referred by the [facility] for primary care to establish.  She has not had a primary care provider in recent times.  She states that clonidine helps her PTSD and is requesting refills on this and propranolol.  Today on arrival blood pressure is 119/58 pulse 76 and she is already on the clonidine 0.2 mg daily.  She was also given a short-term refill on Zyprexa which she takes at bedtime to help with sleep as needed.  She is requesting refills on this as well.   Note during the March hospitalization the patient had an echocardiogram that showed significant tricuspid regurgitation but normal right heart pressures  Patient does have a gynecologist did have a Pap smear over 3 years ago and is now becoming due.  Patient does need hepatitis C screening.  Note patient's not actively suicidal at this time.  She does have reflux symptoms on a daily basis.  She is on over-the-counter Nexium is requesting a prescription for Protonix.  Patient is due a mammogram.  Patient's mental health stressors are that her fianc hung himself in their home last fall.  Also her sister committed suicide in October.  This resulted in significant issues for this patient.  10/24/21 Since the last visit the patient unfortunately has lost her job and cannot afford to pay for her medications.  She does not have  any insurance.  She cannot afford any of the [employer].  On arrival blood pressure 111/76 she is doing well on the clonidine twice daily.  Her PHQ-9 and GAD-7 are mildly elevated.  She is not suicidal.  She tried to go see cardiology but could not make the appointment because her boss would not let her off work for this visit.  Also she was unable to attend a mammogram appointment twice because of her job.  She also needs colon cancer screening.  Note on arrival she did receive the flu vaccine  The patient has no other complaints  3/27 Patient returns in follow-up and complains of bilateral knee pain and progressive weight gain.  She normally weighs in the 120 range now she is 182 with a BMI of 32.  She had been seen by cardiology had a negative coronary CT and echocardiogram was unremarkable  The patient has been seen by cardiology assessment is as below Tricuspid valve insufficiency, unspecified etiology   Essential hypertension   Mixed hyperlipidemia   Chest pain of uncertain etiology   Dyspnea, unspecified type       PLAN:     In order of problems listed above:   1.  I reviewed her echocardiogram from 2022 which does demonstrate good deal of tricuspid regurgitation.  I will repeat an echocardiogram now.  We may need to perform a right heart catheterization depending on these results.  Follow-up  in 1 year or earlier if needed 2.  Blood pressure is at goal today. 3.  This is being followed by her primary care provider. 4.  We will obtain coronary CTA.  This would also inform the need for aspirin. 5.  We will obtain echocardiogram to evaluate.  I have a feeling this is due to her 60 pound weight gain.   Coronary CT was normal and echocardiogram was normal blood pressure remains at goal on clonidine.  Mental health is stable.  She does have bilateral knee pain.  She does not eat breakfast she takes protein shakes with nuts for lunch she eats salads chicken breast sweet  potatoes for dinner she is not getting much fresh fruits or vegetables in her diet.  She does walk daily for about an hour with her dog which is a lab or doodle.  She would like diabetes screening at this visit.  She complains of fractured left molar in the left lower jaw.  She would like a dental exam.  She now has the orange card and blue card.  She still has reflux if she overeats at night late.  She has not had a period in 1 year and she is sleeping better since she is in recovery.  She is questioning whether she could obtain progesterone for menopause.     Recent mammogram was normal.  She has a Pap smear appointment upcoming.  There are no other primary care gaps.  7/31 Patient seen today in return follow-up she has had continued weight gain she is up to 183 pounds at a BMI of 32-1/2.  Blood pressure on arrival elevated 137/95.  It is elevated on recheck.  The patient is trying to eat a lifestyle medicine diet but is still eating quite a bit of carbohydrates and processed foods with the diet.  She is still smoking 3 cigarettes daily.  She does have prediabetes with an A1c of 6.1 blood sugar 168.  The patient does not have any other complaints at this time.  Her mental health is improved.  She does need refills on multiple medications.  The patient does maintain for hypertension control clonidine 0.2 mg daily and propranolol 10 mg daily she is taking the Micronor and is tolerating this well with decreased excess menstruations.  Mental health is following and managing all her mental health medications which include the hydroxyzine mirtazapine and olanzapine and gabapentin along with Pristiq.  01/09/23 Patient seen and return and unfortunately her best friend died of an overdose recently and she has been in a great deal of distress since that time.  She does see mental health and just saw her recently below is documentation from that visit.  She does need medication refills.  Blood pressure is good 98/61  on arrival. The patient needs a colon exam and also needs a Pap smear and a mammogram.  Patient does complain of reflux it is worsening. 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 Attending AA meetings and showing improvements in overall psychosocial functioning. No changes to plan of care at this time.   I reviewed the patient's chart and discussed the patient and plan of care with the resident. I agree with the  findings and plan as documented in the resident's note and above addendum.    Daine Gip, MD 01/06/23          Expand All Collapse All BH MD/PA/NP OP Progress Note   01/03/2023 9:27 AM Margaret Mccann  MRN:  [id number]   Chief Complaint: No chief complaint on file.   Virtual Visit via Video Note   I connected with Margaret Confer Margaret Mccann on 01/03/23 at  9:30 AM EDT by a video enabled telemedicine application and verified that I am speaking with the correct person using two identifiers.   Location: Patient: Home Provider: Office   I discussed the limitations of evaluation and management by telemedicine and the availability of in person appointments. The patient expressed understanding and agreed to proceed.   History of Present Illness:   Sammie "Higinio Plan" Derrill Memo is a 52 year old female with a PPH of MDD, GAD, PTSD, cocaine use, tobacco use, EtOH use disorder.  Patient reports she has the following medication regimen:   Gabapentin 600 mg TID Remeron 30 mg nightly Pristiq 75 mg daily Zyprexa 2.5 mg nightly Hydroxyzine 50 mg 3 times daily Clonidine 0.2 mg nightly Propranolol 10 mg daily (prescribed by PCP)   Patient reports that she is doing "ok." Patient endorses that she is compliant with her  medications. Patient reports that her anxiety is much better since the gabapentin was increased. She is not really feeling any signs of depression.  She has a job interview this afternoon. She reports that she has had a few panic attacks, but she thinks that these may be due to a friend passing away from an overdose. She endorses that she talks to people in her AA program and her mother as a way of support. She reports that her appetite is normal. She reports that her sleep is better. She endorses a few nights of difficulty since her friends death. She denies SI, HI, and AVH. She does not believe she is having any adverse side effects from her medication currently.    Patient endorses that she is sober from [facility] and does not use THC or illicit substances.    Treatment plan with the patient. The patient was provided an opportunity to ask questions and all were answered. The patient agreed with the plan and demonstrated an understanding of the instructions.   The patient was advised to call back or seek an in-person evaluation if the symptoms worsen or if the condition fails to improve as anticipated.   I provided 15 minutes of non-face-to-face time during this encounter.     Bobbye Morton, MD  Visit Diagnosis: No diagnosis found.   Past Psychiatric History:    INPT: 3x, multiple suicide attempts via overdose OPT: Yes Therapist: Previous, interested in appt Geodon (tongue swell), Effexor, Prozac, Lamictal (SJS), Paxil, Cymbalta Likes Pristiq the best Hx of Klonopin   Last visit 10/2022-patient fixated on increasing her gabapentin and having the exact same medication regimen that she had a few years ago when she was in [place].  Was not willing to increase patient to 2400 mg gabapentin but did increase to 1800 mg.   Trauma history: -Found fianc after he hanged himself in 2022, she herself attempted suicide approximately 3 weeks later and was hospitalized -Witnessed her sister die of  seizures after attempting suicide (eventually completing) via overdosing on Wellbutrin however, her sister endorse remorse but was not able to be revived  Assessment and Plan:  Based on assessment today patient  appears to be fairly stable with gabapentin at current dose.  Patient was not as preoccupied with medication today.  Patient did recall conversations at last visit about possible future medication adjustments however she responded well to provider endorsing no need for medication adjustment since she is fairly stable.  Patient endorsed good insight and does not appear to be decompensating despite recent loss of friend to substance use disorder.  Patient is using her coping skills and support system appropriately.  Patient appears to be overall functional in her day-to-day living and has a job interview.   Discussed with patient pending transition of care to a new resident starting July 1st, and likely discontinuation of care by this provider at that time.       EKG: Qtc 448 with HR of 53 in 06/2022   Chronic PTSD MDD in partial remission GAD - Continue Pristiq 75 mg daily - Continue gabapentin to 600 mg 3 times daily - Continue hydroxyzine 50 mg 3 times daily - Continue Remeron 30 mg nightly - Continue Zyprexa 2.5 mg nightly - Continue clonidine 0.2 mg nightly   Primary hypertension - Propranolol 10 mg (prescribed by PCP)   Hx EtOH use disorder, severe - Gabapentin per above        06/12/23 This patient returns in follow-up and is improved with her mental health.  She is no longer suicidal and is being followed by mental health on multiple medications.  She has been seen in urgent care for abdominal pain workup was unremarkable.  She has had chronic nausea for which she takes Phenergan.  She does need a mammogram and also needs a Pap smear.  There are no other complaints at this time.   Past Medical History:  Diagnosis Date   Alcohol withdrawal seizure with complication, with  unspecified complication (HCC) 12/23/2020   Anxiety    Bipolar 1 disorder (HCC)    Depression    Intentional drug overdose (HCC) 08/04/2020   Major depressive disorder, recurrent episode with anxious distress (HCC) 08/11/2020   Morgellons syndrome    MRSA (methicillin resistant Staphylococcus aureus)    Suicide attempt (HCC)    Suicide by drug overdose (HCC) 08/11/2020   Tricuspid valve regurgitation 12/18/2020    Past Surgical History:  Procedure Laterality Date   APPENDECTOMY      Family History  Problem Relation Age of Onset   Heart attack Father 50   Stroke Father    Severe combined immunodeficiency Sister    Breast cancer Paternal Aunt    Ulcerative colitis Neg Hx    Esophageal cancer Neg Hx     Social History   Socioeconomic History   Marital status: Single    Spouse name: Not on file   Number of children: Not on file   Years of education: Not on file   Highest education level: Not on file  Occupational History   Not on file  Tobacco Use   Smoking status: Some Days    Current packs/day: 0.25    Average packs/day: 0.3 packs/day for 5.0 years (1.3 ttl pk-yrs)    Types: Cigarettes   Smokeless tobacco: Never  Vaping Use   Vaping status: Never Used  Substance and Sexual Activity   Alcohol use: Yes    Comment: occas   Drug use: Not Currently    Types: Cocaine    Comment: relapsed the past weekend, no cocaine use prior 6 months   Sexual activity: Not Currently    Birth control/protection: Pill  Other Topics Concern   Not on file  Social History Narrative   ** Merged History Encounter **       Social Determinants of Health   Financial Resource Strain: Not on file  Food Insecurity: No Food Insecurity (07/18/2022)   Hunger Vital Sign    Worried About Running Out of Food in the Last Year: Never true    Ran Out of Food in the Last Year: Never true  Transportation Needs: No Transportation Needs (07/18/2022)   PRAPARE - Scientist, research (physical sciences) (Medical): No    Lack of Transportation (Non-Medical): No  Physical Activity: Not on file  Stress: Not on file  Social Connections: Not on file  Intimate Partner Violence: Not At Risk (07/18/2022)   Humiliation, Afraid, Rape, and Kick questionnaire    Fear of Current or Ex-Partner: No    Emotionally Abused: No    Physically Abused: No    Sexually Abused: No    Outpatient Medications Prior to Visit  Medication Sig Dispense Refill   cloNIDine (CATAPRES) 0.2 MG tablet Take 1 tablet (0.2 mg total) by mouth at bedtime. 30 tablet 3   desvenlafaxine (PRISTIQ) 25 MG 24 hr tablet Take 3 tablets (75 mg total) by mouth daily. 90 tablet 3   gabapentin (NEURONTIN) 600 MG tablet Take 1 tablet (600 mg total) by mouth 3 (three) times daily. 90 tablet 1   hydrOXYzine (ATARAX) 50 MG tablet Take 1 tablet (50 mg total) by mouth 3 (three) times daily as needed for anxiety. 90 tablet 3   mirtazapine (REMERON SOL-TAB) 45 MG disintegrating tablet Take 1 tablet (45 mg total) by mouth at bedtime. 30 tablet 3   propranolol (INDERAL) 10 MG tablet Take 1 tablet (10 mg total) by mouth 2 (two) times daily. 60 tablet 3   atorvastatin (LIPITOR) 20 MG tablet Take 1 tablet (20 mg total) by mouth at bedtime. 90 tablet 0   melatonin 3 MG TABS tablet Take 1 tablet (3 mg total) by mouth at bedtime.  0   norethindrone (INCASSIA) 0.35 MG tablet Take 1 tablet (0.35 mg total) by mouth daily. 28 tablet 2   pantoprazole (PROTONIX) 40 MG tablet Take 1 tablet (40 mg total) by mouth 2 (two) times daily before a meal. 60 tablet 1   promethazine (PHENERGAN) 12.5 MG tablet Take 1 tablet (12.5 mg total) by mouth every 8 (eight) hours as needed for nausea or vomiting. 20 tablet 0   valsartan (DIOVAN) 80 MG tablet Take 1 tablet (80 mg total) by mouth daily. 90 tablet 1   No facility-administered medications prior to visit.    Allergies  Allergen Reactions   Lamictal [Lamotrigine] Anaphylaxis, Rash and Other (See Comments)     Stevens-Johnson syndrome and fevers, also   Lamictal [Lamotrigine] Anaphylaxis, Rash and Other (See Comments)    Fevers and Stevens-Johnson syndrome, also   Tramadol Other (See Comments)    Pt had a seizure after taking Tramadol!!   Geodon [Ziprasidone Hcl] Rash   Levetiracetam Anxiety    ROS Review of Systems  Constitutional: Negative.  Negative for chills, diaphoresis and fever.  HENT: Negative.  Negative for congestion, ear pain, hearing loss, nosebleeds, postnasal drip, rhinorrhea, sinus pressure, sore throat, tinnitus, trouble swallowing and voice change.   Eyes: Negative.  Negative for photophobia and redness.  Respiratory: Negative.  Negative for apnea, cough, choking, chest tightness, shortness of breath, wheezing and stridor.   Cardiovascular: Negative.  Negative for chest pain, palpitations and  leg swelling.  Gastrointestinal: Negative.  Negative for abdominal distention, abdominal pain, blood in stool, constipation, diarrhea, nausea and vomiting.  Endocrine: Negative for polydipsia.  Genitourinary: Negative.  Negative for dysuria, flank pain, frequency, hematuria and urgency.  Musculoskeletal: Negative.  Negative for arthralgias, back pain, myalgias and neck pain.  Skin: Negative.  Negative for rash.  Allergic/Immunologic: Negative.  Negative for environmental allergies and food allergies.  Neurological: Negative.  Negative for dizziness, tremors, seizures, syncope, weakness and headaches.  Hematological: Negative.  Negative for adenopathy. Does not bruise/bleed easily.  Psychiatric/Behavioral:  Negative for agitation, decreased concentration, dysphoric mood, self-injury, sleep disturbance and suicidal ideas. The patient is nervous/anxious. The patient is not hyperactive.       Objective:    Physical Exam Vitals reviewed.  Constitutional:      Appearance: Normal appearance. She is well-developed. She is obese. She is not diaphoretic.  HENT:     Head: Normocephalic  and atraumatic.     Right Ear: Tympanic membrane normal.     Left Ear: Tympanic membrane normal.     Nose: Nose normal. No nasal deformity, septal deviation, mucosal edema, congestion or rhinorrhea.     Right Sinus: No maxillary sinus tenderness or frontal sinus tenderness.     Left Sinus: No maxillary sinus tenderness or frontal sinus tenderness.     Mouth/Throat:     Mouth: Mucous membranes are moist.     Pharynx: Oropharynx is clear. No oropharyngeal exudate.     Comments: Fractured left first molar left lower jaw Eyes:     General: No scleral icterus.    Conjunctiva/sclera: Conjunctivae normal.     Pupils: Pupils are equal, round, and reactive to light.  Neck:     Thyroid: No thyromegaly.     Vascular: No carotid bruit or JVD.     Trachea: Trachea normal. No tracheal tenderness or tracheal deviation.  Cardiovascular:     Rate and Rhythm: Normal rate and regular rhythm.     Chest Wall: PMI is not displaced.     Pulses: Normal pulses. No decreased pulses.     Heart sounds: Normal heart sounds, S1 normal and S2 normal. Heart sounds not distant. No murmur heard.    No systolic murmur is present.     No diastolic murmur is present.     No friction rub. No gallop. No S3 or S4 sounds.  Pulmonary:     Effort: No tachypnea, accessory muscle usage or respiratory distress.     Breath sounds: No stridor. No decreased breath sounds, wheezing, rhonchi or rales.  Chest:     Chest wall: No tenderness.  Abdominal:     General: Bowel sounds are normal. There is no distension.     Palpations: Abdomen is soft. Abdomen is not rigid.     Tenderness: There is no abdominal tenderness. There is no guarding or rebound.  Musculoskeletal:        General: No tenderness. Normal range of motion.     Cervical back: Normal range of motion and neck supple. No edema, erythema or rigidity. No muscular tenderness. Normal range of motion.  Lymphadenopathy:     Head:     Right side of head: No submental or  submandibular adenopathy.     Left side of head: No submental or submandibular adenopathy.     Cervical: No cervical adenopathy.  Skin:    General: Skin is warm and dry.     Coloration: Skin is not pale.  Findings: No rash.     Nails: There is no clubbing.  Neurological:     General: No focal deficit present.     Mental Status: She is alert and oriented to person, place, and time. Mental status is at baseline.     Sensory: No sensory deficit.  Psychiatric:        Attention and Perception: Attention and perception normal.        Mood and Affect: Affect normal. Mood is anxious and depressed.        Speech: Speech normal.        Behavior: Behavior normal. Behavior is cooperative.        Thought Content: Thought content normal. Thought content does not include homicidal or suicidal ideation. Thought content does not include homicidal or suicidal plan.        Cognition and Memory: Cognition and memory normal.        Judgment: Judgment normal.     BP 120/85 (BP Location: Left Arm, Patient Position: Sitting, Cuff Size: Normal)   Pulse 76   Wt 145 lb 3.2 oz (65.9 kg)   LMP 10/27/2019   SpO2 98%   BMI 25.72 kg/m  Wt Readings from Last 3 Encounters:  06/12/23 145 lb 3.2 oz (65.9 kg)  06/04/23 143 lb (64.9 kg)  01/09/23 170 lb 3.2 oz (77.2 kg)  11/2021 echo IMPRESSIONS Left ventricular ejection fraction, by estimation, is 60 to 65%. The left ventricle has normal function. The left ventricle has no regional wall motion abnormalities. Left ventricular diastolic parameters were normal. 1. Right ventricular systolic function is normal. The right ventricular size is normal. There is normal pulmonary artery systolic pressure. 2. 3. The mitral valve is normal in structure. Trivial mitral valve regurgitation. 4. The aortic valve is normal in structure. Aortic valve regurgitation is not visualized 11/24/21 Cor CT  IMPRESSION: 1. Coronary calcium score of 0. This was 0 percentile for age-,  sex, and race-matched controls.   2.  Normal coronary origin with right dominance.   3.  Normal coronary arteries.  CAD RADS 0.   4.  Consider non atherosclerotic causes of chest pain.    Health Maintenance Due  Topic Date Due   Cervical Cancer Screening (Pap smear)  01/24/2023   Zoster Vaccines- Shingrix (1 of 2) Never done     There are no preventive care reminders to display for this patient.  Lab Results  Component Value Date   TSH 3.544 07/20/2022   Lab Results  Component Value Date   WBC 7.4 01/12/2023   HGB 13.5 01/12/2023   HCT 40.1 01/12/2023   MCV 90 01/12/2023   PLT 287 01/12/2023   Lab Results  Component Value Date   NA 138 01/12/2023   K 4.6 01/12/2023   CO2 20 01/12/2023   GLUCOSE 91 01/12/2023   BUN 6 01/12/2023   CREATININE 0.75 01/12/2023   BILITOT <0.2 01/12/2023   ALKPHOS 97 01/12/2023   AST 24 01/12/2023   ALT 31 01/12/2023   PROT 6.9 01/12/2023   ALBUMIN 4.4 01/12/2023   CALCIUM 9.5 01/12/2023   ANIONGAP 7 07/17/2022   EGFR 98 01/12/2023   GFR 75.12 05/25/2017   Lab Results  Component Value Date   CHOL 199 07/20/2022   Lab Results  Component Value Date   HDL 58 07/20/2022   Lab Results  Component Value Date   LDLCALC 118 (H) 07/20/2022   Lab Results  Component Value Date   TRIG 117 07/20/2022  Lab Results  Component Value Date   CHOLHDL 3.4 07/20/2022   Lab Results  Component Value Date   HGBA1C 6.0 (H) 01/12/2023      Assessment & Plan:   Problem List Items Addressed This Visit       Cardiovascular and Mediastinum   Essential hypertension    Currently under good control continue with clonidine Inderal valsartan all of which are controlling blood pressure 2 of which are for mental health as well      Relevant Medications   atorvastatin (LIPITOR) 20 MG tablet   valsartan (DIOVAN) 80 MG tablet     Digestive   Dental caries    Continue with dental care        Other   MDD (major depressive disorder),  recurrent severe, without psychosis (HCC)    Improved mental health no changes as per mental health team      Mixed hyperlipidemia   Relevant Medications   atorvastatin (LIPITOR) 20 MG tablet   valsartan (DIOVAN) 80 MG tablet   Nausea    Chronic nausea will refill Phenergan      Relevant Medications   promethazine (PHENERGAN) 12.5 MG tablet   RESOLVED: Acute abdominal pain   Relevant Medications   promethazine (PHENERGAN) 12.5 MG tablet   Other Visit Diagnoses     Encounter for screening mammogram for malignant neoplasm of breast    -  Primary   Relevant Orders   MM DIGITAL SCREENING BILATERAL       Meds ordered this encounter  Medications   norethindrone (INCASSIA) 0.35 MG tablet    Sig: Take 1 tablet (0.35 mg total) by mouth daily.    Dispense:  28 tablet    Refill:  2   promethazine (PHENERGAN) 12.5 MG tablet    Sig: Take 1 tablet (12.5 mg total) by mouth every 8 (eight) hours as needed for nausea or vomiting.    Dispense:  40 tablet    Refill:  1   atorvastatin (LIPITOR) 20 MG tablet    Sig: Take 1 tablet (20 mg total) by mouth at bedtime.    Dispense:  90 tablet    Refill:  0    Must keep upcoming PCP visit for refills   pantoprazole (PROTONIX) 40 MG tablet    Sig: Take 1 tablet (40 mg total) by mouth 2 (two) times daily before a meal.    Dispense:  60 tablet    Refill:  1   valsartan (DIOVAN) 80 MG tablet    Sig: Take 1 tablet (80 mg total) by mouth daily.    Dispense:  90 tablet    Refill:  1  Fecal occult and mammogram studies ordered 30 minutes spent history and physical complex decision making high multiple systems assessed Follow-up: Return in about 6 months (around 12/10/2023) for htn, primary care follow up.    Shan Levans, MD

## 2023-06-12 ENCOUNTER — Ambulatory Visit: Payer: Self-pay | Attending: Critical Care Medicine | Admitting: Critical Care Medicine

## 2023-06-12 ENCOUNTER — Encounter: Payer: Self-pay | Admitting: Critical Care Medicine

## 2023-06-12 ENCOUNTER — Other Ambulatory Visit: Payer: Self-pay

## 2023-06-12 VITALS — BP 120/85 | HR 76 | Wt 145.2 lb

## 2023-06-12 DIAGNOSIS — R110 Nausea: Secondary | ICD-10-CM

## 2023-06-12 DIAGNOSIS — E782 Mixed hyperlipidemia: Secondary | ICD-10-CM

## 2023-06-12 DIAGNOSIS — F332 Major depressive disorder, recurrent severe without psychotic features: Secondary | ICD-10-CM

## 2023-06-12 DIAGNOSIS — I10 Essential (primary) hypertension: Secondary | ICD-10-CM

## 2023-06-12 DIAGNOSIS — Z1231 Encounter for screening mammogram for malignant neoplasm of breast: Secondary | ICD-10-CM

## 2023-06-12 DIAGNOSIS — K029 Dental caries, unspecified: Secondary | ICD-10-CM

## 2023-06-12 MED ORDER — ATORVASTATIN CALCIUM 20 MG PO TABS
20.0000 mg | ORAL_TABLET | Freq: Every day | ORAL | 0 refills | Status: DC
  Filled 2023-06-12 – 2023-06-28 (×4): qty 90, 90d supply, fill #0
  Filled 2023-06-28: qty 30, 30d supply, fill #0

## 2023-06-12 MED ORDER — PANTOPRAZOLE SODIUM 40 MG PO TBEC
40.0000 mg | DELAYED_RELEASE_TABLET | Freq: Two times a day (BID) | ORAL | 1 refills | Status: DC
  Filled 2023-06-12 – 2023-07-01 (×2): qty 60, 30d supply, fill #0
  Filled 2023-07-31: qty 60, 30d supply, fill #1

## 2023-06-12 MED ORDER — NORETHINDRONE 0.35 MG PO TABS
1.0000 | ORAL_TABLET | Freq: Every day | ORAL | 2 refills | Status: DC
  Filled 2023-06-12 – 2023-06-27 (×2): qty 28, 28d supply, fill #0
  Filled 2023-07-30: qty 28, 28d supply, fill #1
  Filled 2023-08-27: qty 28, 28d supply, fill #2

## 2023-06-12 MED ORDER — PROMETHAZINE HCL 12.5 MG PO TABS
12.5000 mg | ORAL_TABLET | Freq: Three times a day (TID) | ORAL | 1 refills | Status: DC | PRN
  Filled 2023-06-12: qty 40, 14d supply, fill #0
  Filled 2023-07-31: qty 40, 14d supply, fill #1

## 2023-06-12 MED ORDER — VALSARTAN 80 MG PO TABS
80.0000 mg | ORAL_TABLET | Freq: Every day | ORAL | 1 refills | Status: DC
  Filled 2023-06-12 – 2023-06-28 (×4): qty 90, 90d supply, fill #0
  Filled 2023-09-30: qty 90, 90d supply, fill #1

## 2023-06-12 NOTE — Assessment & Plan Note (Signed)
Currently under good control continue with clonidine Inderal valsartan all of which are controlling blood pressure 2 of which are for mental health as well

## 2023-06-12 NOTE — Assessment & Plan Note (Signed)
Improved mental health no changes as per mental health team

## 2023-06-12 NOTE — Assessment & Plan Note (Signed)
Chronic nausea will refill Phenergan

## 2023-06-12 NOTE — Patient Instructions (Addendum)
Medicaid office located 4th floor [address]   Mammogram and Pap smear ordered  All medications refilled  Return 6 months

## 2023-06-12 NOTE — Assessment & Plan Note (Signed)
Continue with dental care

## 2023-06-25 ENCOUNTER — Other Ambulatory Visit: Payer: Self-pay

## 2023-06-26 ENCOUNTER — Other Ambulatory Visit: Payer: Self-pay

## 2023-06-27 ENCOUNTER — Other Ambulatory Visit: Payer: Self-pay

## 2023-06-28 ENCOUNTER — Other Ambulatory Visit: Payer: Self-pay

## 2023-06-30 ENCOUNTER — Other Ambulatory Visit: Payer: Self-pay

## 2023-07-02 ENCOUNTER — Other Ambulatory Visit: Payer: Self-pay

## 2023-07-03 ENCOUNTER — Other Ambulatory Visit: Payer: Self-pay

## 2023-07-04 ENCOUNTER — Ambulatory Visit: Payer: Self-pay | Admitting: Physician Assistant

## 2023-07-04 ENCOUNTER — Other Ambulatory Visit: Payer: Self-pay

## 2023-07-06 ENCOUNTER — Other Ambulatory Visit: Payer: Self-pay

## 2023-07-30 ENCOUNTER — Other Ambulatory Visit (HOSPITAL_COMMUNITY): Payer: Self-pay | Admitting: Psychiatry

## 2023-07-31 ENCOUNTER — Other Ambulatory Visit: Payer: Self-pay

## 2023-07-31 MED ORDER — GABAPENTIN 600 MG PO TABS
600.0000 mg | ORAL_TABLET | Freq: Three times a day (TID) | ORAL | 1 refills | Status: DC
  Filled 2023-07-31 (×3): qty 90, 30d supply, fill #0
  Filled 2023-08-27 – 2023-08-28 (×3): qty 90, 30d supply, fill #1

## 2023-08-01 ENCOUNTER — Other Ambulatory Visit: Payer: Self-pay

## 2023-08-02 ENCOUNTER — Other Ambulatory Visit: Payer: Self-pay

## 2023-08-10 ENCOUNTER — Other Ambulatory Visit: Payer: Self-pay

## 2023-08-13 ENCOUNTER — Telehealth (HOSPITAL_COMMUNITY): Payer: Self-pay

## 2023-08-13 ENCOUNTER — Other Ambulatory Visit: Payer: Self-pay

## 2023-08-13 ENCOUNTER — Ambulatory Visit: Payer: Self-pay

## 2023-08-13 ENCOUNTER — Ambulatory Visit: Payer: Self-pay | Admitting: *Deleted

## 2023-08-13 ENCOUNTER — Telehealth (HOSPITAL_COMMUNITY): Payer: Self-pay | Admitting: Psychiatry

## 2023-08-13 NOTE — Telephone Encounter (Signed)
  Chief Complaint: Pt. Left her clonidine and propranolol in a hotel room.  She is needing refills but can't get in contact with Dr. Kathlene November who prescribes these.   Would Dr. Delford Field be willing to call these in for her or call Dr. Doyne Keel.   "I can't seem to get through to her with my messages that I need these refilled because they got left in a hotel room.   Been off of them for 6 days now. Symptoms: BP elevated with bottom number 110.   "I just don't feel good". Frequency: Been off meds for 6 days now Pertinent Negatives: Patient denies being able to get in contact with Dr. Doyne Keel. Disposition: [] ED /[] Urgent Care (no appt availability in office) / [] Appointment(In office/virtual)/ []  [place] [facility]/ [] Home Care/ [] Refused Recommended Disposition /[] [place] [facility]/ [x]  Follow-up with PCP Additional Notes: Message sent to Dr. Delford Field.   She would like them called into [employer] [facility] [employer]   Would someone please call her on her mother's phone at [phone] and let her know the outcome.    LOV with Dr. Shan Levans was 06/12/2023.

## 2023-08-13 NOTE — Telephone Encounter (Signed)
Reason for Disposition  Ran out of BP medications    Dr. Kathlene November not responding to her requests for refills of the clonidine and propranolol.   Would Dr. Delford Field be willing to call these in for me?   My BP is very high.  Answer Assessment - Initial Assessment Questions 1. BLOOD PRESSURE: "What is the blood pressure?" "Did you take at least two measurements 5 minutes apart?"     BP is high.   My clonidine and propranolol was left in a hotel room.   I've been off of it 6 days now.   Dr Doyne Keel prescribes it for me.  I can't get it from her.  I don't know what is going on but I have refills.   They won't refill them unless authorized.   Dr. Doyne Keel has not authorized them.   My BP 157/110.   I can't get a hold of Dr. Doyne Keel.   I've called 3 times on Friday.   They say they will send a message  to Dr. Doyne Keel but I never hear from them.   Would Dr. Delford Field be willing to call these in for me to the pharmacy there at [facility]?    I really need my BP medications. 2. ONSET: "When did you take your blood pressure?"     My mom has a BP cuff.    I've been monitoring it through the weekend. 3. HOW: "How did you take your blood pressure?" (e.g., automatic home BP monitor, visiting nurse)     Mom's cuff 4. HISTORY: "Do you have a history of high blood pressure?"     Yes 5. MEDICINES: "Are you taking any medicines for blood pressure?" "Have you missed any doses recently?"     Clonidine and propranolol  6. OTHER SYMPTOMS: "Do you have any symptoms?" (e.g., blurred vision, chest pain, difficulty breathing, headache, weakness)     "I just don't feel good" 7. PREGNANCY: "Is there any chance you are pregnant?" "When was your last menstrual period?"     Not asked  Protocols used: Blood Pressure - High-A-AH

## 2023-08-13 NOTE — Telephone Encounter (Signed)
  Chief Complaint: HTN Symptoms: BP elevated 160/117 Frequency: since Friday  Pertinent Negatives: Patient denies any sx Disposition: [x] ED /[] Urgent Care (no appt availability in office) / [] Appointment(In office/virtual)/ []  [place] [facility]/ [] Home Care/ [] Refused Recommended Disposition /[] [place] [facility]/ []  Follow-up with PCP Additional Notes: pt states she left her clonidine and propanolol at hotel. Tried to call hotel to see if they had them or not but didn't. Pt states she needs her medications because it been since Friday she hasn't had any and BP elevated d/t it. Pt spoke with Arline Asp, NT earlier and message was sent, also she has called [facility] since they prescribed and pt can't get a response. I advised her there wasn't anything else our office could do besides see if [facility] will respond, recommended if BP gets any worse or other sx start for her to go to ED. Pt hung up the line.    Reason for Disposition  Ran out of BP medications  Answer Assessment - Initial Assessment Questions 1. BLOOD PRESSURE: "What is the blood pressure?" "Did you take at least two measurements 5 minutes apart?"     160/117  2. ONSET: "When did you take your blood pressure?"     Been out of meds since Friday  3. HOW: "How did you take your blood pressure?" (e.g., automatic home BP monitor, visiting nurse)     Home BP  4. HISTORY: "Do you have a history of high blood pressure?"     yes 5. MEDICINES: "Are you taking any medicines for blood pressure?" "Have you missed any doses recently?"     Yes, left meds at hotel  6. OTHER SYMPTOMS: "Do you have any symptoms?" (e.g., blurred vision, chest pain, difficulty breathing, headache, weakness)     no  Protocols used: Blood Pressure - High-A-AH

## 2023-08-14 ENCOUNTER — Telehealth: Payer: Self-pay | Admitting: Critical Care Medicine

## 2023-08-14 ENCOUNTER — Other Ambulatory Visit: Payer: Self-pay

## 2023-08-14 ENCOUNTER — Other Ambulatory Visit (HOSPITAL_COMMUNITY): Payer: Self-pay | Admitting: Psychiatry

## 2023-08-14 DIAGNOSIS — I10 Essential (primary) hypertension: Secondary | ICD-10-CM

## 2023-08-14 MED ORDER — PROPRANOLOL HCL 10 MG PO TABS
10.0000 mg | ORAL_TABLET | Freq: Two times a day (BID) | ORAL | 3 refills | Status: DC
  Filled 2023-08-14 (×2): qty 60, 30d supply, fill #0
  Filled 2023-09-10 (×2): qty 60, 30d supply, fill #1
  Filled 2023-10-19 – 2023-10-22 (×2): qty 60, 30d supply, fill #2
  Filled 2023-11-16 (×2): qty 60, 30d supply, fill #3

## 2023-08-14 MED ORDER — CLONIDINE HCL 0.2 MG PO TABS
0.2000 mg | ORAL_TABLET | Freq: Every day | ORAL | 3 refills | Status: DC
  Filled 2023-08-14 (×2): qty 30, 30d supply, fill #0
  Filled 2023-09-10 (×2): qty 30, 30d supply, fill #1
  Filled 2023-10-09 – 2023-10-10 (×2): qty 30, 30d supply, fill #2
  Filled 2023-11-05 (×2): qty 30, 30d supply, fill #0

## 2023-08-14 NOTE — Telephone Encounter (Signed)
Copied from CRM [id number]. Topic: General - Other >> Aug 13, 2023  4:49 PM Turkey B wrote: Reason for CRM: pt left her meds at a hotel and she is anxious about getting these tonight, I know we can't guarantee that, but pt wants to be called back about getting these asap. clonidine and propranolol

## 2023-08-14 NOTE — Telephone Encounter (Signed)
Behavioral health has sent in the prescription to her pharmacy.  Thanks.

## 2023-08-14 NOTE — Telephone Encounter (Signed)
Call placed to [facility] Patient's mother, unable to reach message left on VM.    Noted in chart. clonidine and propranolol  was filled by Burtis Junes, NP  today.

## 2023-08-14 NOTE — Telephone Encounter (Signed)
Medication sent to preferred pharmacy

## 2023-08-26 ENCOUNTER — Other Ambulatory Visit: Payer: Self-pay | Admitting: Critical Care Medicine

## 2023-08-26 DIAGNOSIS — R110 Nausea: Secondary | ICD-10-CM

## 2023-08-27 ENCOUNTER — Other Ambulatory Visit: Payer: Self-pay

## 2023-08-27 MED ORDER — PROMETHAZINE HCL 12.5 MG PO TABS
12.5000 mg | ORAL_TABLET | Freq: Three times a day (TID) | ORAL | 1 refills | Status: DC | PRN
  Filled 2023-08-27: qty 40, 14d supply, fill #0
  Filled 2023-09-25 – 2023-09-27 (×2): qty 40, 14d supply, fill #1

## 2023-08-28 ENCOUNTER — Other Ambulatory Visit: Payer: Self-pay | Admitting: Critical Care Medicine

## 2023-08-28 ENCOUNTER — Ambulatory Visit (INDEPENDENT_AMBULATORY_CARE_PROVIDER_SITE_OTHER): Payer: Self-pay | Admitting: Student

## 2023-08-28 ENCOUNTER — Other Ambulatory Visit: Payer: Self-pay

## 2023-08-28 VITALS — BP 132/86 | HR 87 | Ht 63.0 in | Wt 149.6 lb

## 2023-08-28 DIAGNOSIS — F4310 Post-traumatic stress disorder, unspecified: Secondary | ICD-10-CM

## 2023-08-28 DIAGNOSIS — F1491 Cocaine use, unspecified, in remission: Secondary | ICD-10-CM

## 2023-08-28 DIAGNOSIS — F3341 Major depressive disorder, recurrent, in partial remission: Secondary | ICD-10-CM

## 2023-08-28 DIAGNOSIS — I10 Essential (primary) hypertension: Secondary | ICD-10-CM

## 2023-08-28 DIAGNOSIS — F1021 Alcohol dependence, in remission: Secondary | ICD-10-CM

## 2023-08-28 MED ORDER — GABAPENTIN 600 MG PO TABS
600.0000 mg | ORAL_TABLET | Freq: Four times a day (QID) | ORAL | 1 refills | Status: DC
  Filled 2023-08-28 – 2023-09-17 (×13): qty 120, 30d supply, fill #0
  Filled 2023-10-12: qty 120, 30d supply, fill #1

## 2023-08-28 NOTE — Progress Notes (Signed)
[facility] MD/PA/NP OP Progress Note    08/28/2023 9:59 AM Margaret Mccann  MRN:  [id number]  Chief Complaint: Anxiety    HPI: 52 year old female seen today for follow-up psychiatric evaluation.  She has a psychiatric history of depression, PTSD, GAD, substance use (tobacco, cocaine, alcohol).  Currently she is managed on gabapentin 600 mg three times daily, Remeron 45 mg nightly, Pristiq 75 mg daily,  hydroxyzine 50 mg 3 times daily, propanolol 10 mg twice daily and clonidine 0.2 mg nightly. She notes her medications a somewhat effective in managing her psychiatric conditions.  Patient reports that her anxiety is unmanaged, particularly in the afternoons. She has been taking gabapentin QID for years, as she takes 2 at bedtime. Takes 10 AM, and 2 at 10 PM. Previously took 2 at 10 AM and 2 at 10 PM. Taking hydroxyzine TID. Medication compliant. Propranolol helps with anxiety and night terrors. Remeron increase has helped with sleep.   Since her last visit she informed writer that she continues to be increasingly anxious.  She request that gabapentin be increased to 600 mg 4 times daily. Today she denies SI/HI/AVH, mania, paranoia.    Denies tobacco- 6-8 cigarettes. Denies vaping. Alcohol: Last time 6-7 mnoths ago;  Illicit: Denies current cocaine use (last time was 3-4 years ago). Denies current illicit substance use.   Pain reported during previous visit was due to being out of clonidine x 3 days.     Visit Diagnosis:    ICD-10-CM   1. Major depressive disorder, recurrent episode, in partial remission with anxious distress (HCC)  F33.41     2. PTSD (post-traumatic stress disorder)  F43.10     3. Cocaine use disorder in remission  F14.91     4. Alcohol use disorder, severe, in early remission (HCC)  F10.21     5. Primary hypertension  I10        Past Psychiatric History:  MDD , anxiety, PTSD, Substance abuse (cocaine, alcohol)  Past Medical History:  Past Medical History:   Diagnosis Date   Alcohol withdrawal seizure with complication, with unspecified complication (HCC) 12/23/2020   Anxiety    Bipolar 1 disorder (HCC)    Depression    Intentional drug overdose (HCC) 08/04/2020   Major depressive disorder, recurrent episode with anxious distress (HCC) 08/11/2020   Morgellons syndrome    MRSA (methicillin resistant Staphylococcus aureus)    Suicide attempt (HCC)    Suicide by drug overdose (HCC) 08/11/2020   Tricuspid valve regurgitation 12/18/2020    Past Surgical History:  Procedure Laterality Date   APPENDECTOMY      Family Psychiatric History: Sister - committed suicide in 2005 by overdosing.  Family History:  Family History  Problem Relation Age of Onset   Heart attack Father 38   Stroke Father    Severe combined immunodeficiency Sister    Breast cancer Paternal Aunt    Ulcerative colitis Neg Hx    Esophageal cancer Neg Hx     Social History:  Social History   Socioeconomic History   Marital status: Single    Spouse name: Not on file   Number of children: Not on file   Years of education: Not on file   Highest education level: Not on file  Occupational History   Not on file  Tobacco Use   Smoking status: Some Days    Current packs/day: 0.25    Average packs/day: 0.3 packs/day for 5.0 years (1.3 ttl pk-yrs)    Types: Cigarettes  Smokeless tobacco: Never  Vaping Use   Vaping status: Never Used  Substance and Sexual Activity   Alcohol use: Yes    Comment: occas   Drug use: Not Currently    Types: Cocaine    Comment: relapsed the past weekend, no cocaine use prior 6 months   Sexual activity: Not Currently    Birth control/protection: Pill  Other Topics Concern   Not on file  Social History Narrative   ** Merged History Encounter **       Social Determinants of Health   Financial Resource Strain: Not on file  Food Insecurity: No Food Insecurity (07/18/2022)   Hunger Vital Sign    Worried About Running Out of Food in  the Last Year: Never true    Ran Out of Food in the Last Year: Never true  Transportation Needs: No Transportation Needs (07/18/2022)   PRAPARE - Administrator, Civil Service (Medical): No    Lack of Transportation (Non-Medical): No  Physical Activity: Not on file  Stress: Not on file  Social Connections: Not on file    Allergies:  Allergies  Allergen Reactions   Lamictal [Lamotrigine] Anaphylaxis, Rash and Other (See Comments)    Stevens-Johnson syndrome and fevers, also   Lamictal [Lamotrigine] Anaphylaxis, Rash and Other (See Comments)    Fevers and Stevens-Johnson syndrome, also   Tramadol Other (See Comments)    Pt had a seizure after taking Tramadol!!   Geodon [Ziprasidone Hcl] Rash   Levetiracetam Anxiety    Metabolic Disorder Labs: Lab Results  Component Value Date   HGBA1C 6.0 (H) 01/12/2023   MPG 116.89 07/20/2022   MPG 114.02 12/19/2020   No results found for: "PROLACTIN" Lab Results  Component Value Date   CHOL 199 07/20/2022   TRIG 117 07/20/2022   HDL 58 07/20/2022   CHOLHDL 3.4 07/20/2022   VLDL 23 07/20/2022   LDLCALC 118 (H) 07/20/2022   LDLCALC 69 05/16/2021   Lab Results  Component Value Date   TSH 3.544 07/20/2022   TSH 1.300 05/16/2021    Therapeutic Level Labs: No results found for: "LITHIUM" No results found for: "VALPROATE" No results found for: "CBMZ"  Current Medications: Current Outpatient Medications  Medication Sig Dispense Refill   atorvastatin (LIPITOR) 20 MG tablet Take 1 tablet (20 mg total) by mouth at bedtime. 90 tablet 0   cloNIDine (CATAPRES) 0.2 MG tablet Take 1 tablet (0.2 mg total) by mouth at bedtime. 30 tablet 3   desvenlafaxine (PRISTIQ) 25 MG 24 hr tablet Take 3 tablets (75 mg total) by mouth daily. 90 tablet 3   hydrOXYzine (ATARAX) 50 MG tablet Take 1 tablet (50 mg total) by mouth 3 (three) times daily as needed for anxiety. 90 tablet 3   mirtazapine (REMERON SOL-TAB) 45 MG disintegrating tablet Take  1 tablet (45 mg total) by mouth at bedtime. 30 tablet 3   norethindrone (INCASSIA) 0.35 MG tablet Take 1 tablet (0.35 mg total) by mouth daily. 28 tablet 2   promethazine (PHENERGAN) 12.5 MG tablet Take 1 tablet (12.5 mg total) by mouth every 8 (eight) hours as needed for nausea or vomiting. 40 tablet 1   propranolol (INDERAL) 10 MG tablet Take 1 tablet (10 mg total) by mouth 2 (two) times daily. 60 tablet 3   valsartan (DIOVAN) 80 MG tablet Take 1 tablet (80 mg total) by mouth daily. 90 tablet 1   gabapentin (NEURONTIN) 600 MG tablet Take 1 tablet (600 mg total) by mouth in  the morning, at noon, in the evening, and at bedtime. 120 tablet 1   pantoprazole (PROTONIX) 40 MG tablet Take 1 tablet (40 mg total) by mouth 2 (two) times daily before a meal. 180 tablet 0   No current facility-administered medications for this visit.     Musculoskeletal: Strength & Muscle Tone: within normal limits Gait & Station: normal Patient leans: N/A  Psychiatric Specialty Exam: Review of Systems  Blood pressure 132/86, pulse 87, height 5\' 3"  (1.6 m), weight 149 lb 9.6 oz (67.9 kg), last menstrual period 10/27/2019, SpO2 100%.Body mass index is 26.5 kg/m.  General Appearance: Well Groomed  Eye Contact:  Good  Speech:  Clear and Coherent and Normal Rate  Volume:  Normal  Mood:  Anxious, less depressed  Affect:  Appropriate and Congruent  Thought Process:  Coherent, Goal Directed, and Linear  Orientation:  Full (Time, Place, and Person)  Thought Content: WDL and Logical   Suicidal Thoughts:  No  Homicidal Thoughts:  No  Memory:  Immediate;   Good Recent;   Good Remote;   Good  Judgement:  Good  Insight:  Good  Psychomotor Activity:  Normal  Concentration:  Concentration: Good and Attention Span: Good  Recall:  Good  Fund of Knowledge: Good  Language: Good  Akathisia:  No  Handed:  Right  AIMS (if indicated): not done  Assets:  Communication Skills Desire for Improvement Financial  Resources/Insurance Housing  ADL's:  Intact  Cognition: WNL  Sleep:  Fair   Screenings: AIMS    Flowsheet Row [facility]  AIMS Total Score 0      AUDIT    Flowsheet Row [facility]) [facility] Ad[facility] (Dis[facility]  Alcohol Use Disorder Identification Test Final Score (AUDIT) 6 11 0      GAD-7    Flowsheet Row Office Visit from 06/12/2023 in [facility] [place] [facility] [place]. [facility] Clinical Support from 06/04/2023 in [facility] Office Visit from 01/09/2023 in [facility] [place] - [facility] Dept Of [place]. [facility] Video Visit from 07/31/2022 in [facility] Video Visit from 06/15/2022 in [facility]  Total GAD-7 Score 3 12 11 9 4       PHQ2-9    Flowsheet Row Office Visit from 06/12/2023 in [facility] [place] [facility] Dept Of [place]. [facility] Clinical Support from 06/04/2023 in [facility] Office Visit from 01/09/2023 in [facility] [place] - A Dept Of [place]. [facility] Video Visit from 07/31/2022 in [facility] Video Visit from 06/15/2022 in [facility]  PHQ-2 Total Score 0 0 0 1 0  PHQ-9 Total Score -- 10 -- 6 0      Flowsheet Row [facility]) [facility] ED from 01/12/2022 in [facility] Emergency Department at [facility] Video Visit from 08/09/2021 in [facility]  C-SSRS RISK CATEGORY No Risk No Risk No Risk        Assessment  and Plan: Patient continues to endorse increased anxiety, although she believes that her depression and insomnia are better managed  with the increased dose in Remeron.  Patient reports that her anxiety is still unmanaged, and requests an increase in gabapentin.  She had previously been managed on gabapentin 600 mg 4 times daily for many years until October 2023 when her dosage was reduced after her intentional overdose after her husband committed suicide.  Patient reports that at that time, she did relapse and used cocaine but she otherwise denies further substance use.  During patient's virtual visit with this writer in July, we discussed increasing her gabapentin, but it was ultimately denied as patient reported misusing her gabapentin, taking it 4 times daily and running out of medication early rather than the newly prescribed 3 times daily.  At that time, she was informed that she would need to maintain medication compliance as well as coming to her in person appointments in order to discuss further changes.  Patient had missed 2 prior in-person appointments, but presented today.  As well, she has maintained compliance with 3 times daily dosing since July.  At this time, we will increase her gabapentin to 600 mg 4 times daily.  We had the discussion that this is the maximal dosing of gabapentin I would be comfortable prescribing, and she voiced agreement and understanding.  Her gabapentin is to reduce anxiety, particularly surrounding alcohol consumption.  We discussed other options including naltrexone and acamprosate, particularly the latter since she has had months of sobriety, but she declined at this time, stating that she would like to do further research on the medication first.  1. GAD (generalized anxiety disorder)  - Continue mirtazapine 45 MG at bedtime. - Continue- hydrOXYzine 50 MG 3 (three) times daily as needed for anxiety.   - Continue- desvenlafaxine (PRISTIQ) 75 mg daily.   2. Major  depressive disorder, recurrent episode, in partial remission with anxious distress (HCC)  - Continue mirtazapine 45 MG at bedtime. - Continue- hydrOXYzine 50 MG 3 (three) times daily as needed for anxiety.   - Continue- desvenlafaxine (PRISTIQ) 75 mg daily.   3. Primary hypertension  Continue cloNIDine 0.2 MG at bedtime.  Continue- propranolol 10 MG tablet 2 (two) times daily.    Follow up in 4-5 weeks  Lamar Sprinkles, MD 08/28/2023 9:59 AM

## 2023-08-29 ENCOUNTER — Other Ambulatory Visit: Payer: Self-pay

## 2023-08-29 ENCOUNTER — Other Ambulatory Visit (HOSPITAL_COMMUNITY): Payer: Self-pay

## 2023-08-29 ENCOUNTER — Other Ambulatory Visit: Payer: Self-pay | Admitting: Family Medicine

## 2023-08-29 MED ORDER — PANTOPRAZOLE SODIUM 40 MG PO TBEC
40.0000 mg | DELAYED_RELEASE_TABLET | Freq: Two times a day (BID) | ORAL | 0 refills | Status: DC
  Filled 2023-08-29: qty 180, 90d supply, fill #0

## 2023-08-30 ENCOUNTER — Other Ambulatory Visit: Payer: Self-pay

## 2023-08-30 ENCOUNTER — Other Ambulatory Visit (HOSPITAL_COMMUNITY): Payer: Self-pay

## 2023-08-30 ENCOUNTER — Encounter (HOSPITAL_COMMUNITY): Payer: Self-pay | Admitting: Student

## 2023-08-31 ENCOUNTER — Other Ambulatory Visit (HOSPITAL_COMMUNITY): Payer: Self-pay

## 2023-08-31 ENCOUNTER — Other Ambulatory Visit: Payer: Self-pay

## 2023-09-04 ENCOUNTER — Other Ambulatory Visit: Payer: Self-pay

## 2023-09-05 ENCOUNTER — Other Ambulatory Visit: Payer: Self-pay

## 2023-09-10 ENCOUNTER — Other Ambulatory Visit: Payer: Self-pay

## 2023-09-12 ENCOUNTER — Other Ambulatory Visit: Payer: Self-pay

## 2023-09-12 ENCOUNTER — Other Ambulatory Visit (HOSPITAL_COMMUNITY): Payer: Self-pay

## 2023-09-14 ENCOUNTER — Other Ambulatory Visit: Payer: Self-pay

## 2023-09-17 ENCOUNTER — Other Ambulatory Visit: Payer: Self-pay

## 2023-09-25 ENCOUNTER — Other Ambulatory Visit (HOSPITAL_COMMUNITY): Payer: Self-pay

## 2023-09-25 ENCOUNTER — Other Ambulatory Visit (HOSPITAL_BASED_OUTPATIENT_CLINIC_OR_DEPARTMENT_OTHER): Payer: Self-pay

## 2023-09-25 ENCOUNTER — Other Ambulatory Visit: Payer: Self-pay | Admitting: Critical Care Medicine

## 2023-09-25 ENCOUNTER — Telehealth (HOSPITAL_COMMUNITY): Payer: Self-pay | Admitting: *Deleted

## 2023-09-25 ENCOUNTER — Other Ambulatory Visit (HOSPITAL_COMMUNITY): Payer: Self-pay | Admitting: Psychiatry

## 2023-09-25 ENCOUNTER — Other Ambulatory Visit: Payer: Self-pay

## 2023-09-25 DIAGNOSIS — F3341 Major depressive disorder, recurrent, in partial remission: Secondary | ICD-10-CM

## 2023-09-25 DIAGNOSIS — F411 Generalized anxiety disorder: Secondary | ICD-10-CM

## 2023-09-25 MED ORDER — HYDROXYZINE HCL 50 MG PO TABS
50.0000 mg | ORAL_TABLET | Freq: Three times a day (TID) | ORAL | 0 refills | Status: DC | PRN
  Filled 2023-09-25: qty 90, 30d supply, fill #0

## 2023-09-25 MED ORDER — MIRTAZAPINE 45 MG PO TBDP
45.0000 mg | ORAL_TABLET | Freq: Every day | ORAL | 0 refills | Status: DC
  Filled 2023-09-25: qty 30, 30d supply, fill #0

## 2023-09-25 NOTE — Telephone Encounter (Signed)
 Covering inbox for Dr. Rainelle.  Received Message that patient needed refills of her Remeron  and Hydroxyzine .  Her next appointment is 1/15 so 1 one month bridge will be sent in.   Sent: -Remeron  SOL-TAB 45 mg QHS.  30 tablets with 0 refills. -Hydroxyzine  50 mg TID PRN.  90 tablets with 0 refills.   Marolyn Rosser MD Resident

## 2023-09-25 NOTE — Telephone Encounter (Signed)
Patient states that she is out of Remeron and Hydroxyzine and needs "urgent refills." Explained that it is a holiday and that most providers are off today and tomorrow but a message would be sent. Next appointment 1/15.

## 2023-09-26 MED ORDER — NORETHINDRONE 0.35 MG PO TABS
1.0000 | ORAL_TABLET | Freq: Every day | ORAL | 0 refills | Status: DC
  Filled 2023-09-26: qty 84, 84d supply, fill #0

## 2023-09-27 ENCOUNTER — Other Ambulatory Visit: Payer: Self-pay

## 2023-09-28 ENCOUNTER — Other Ambulatory Visit: Payer: Self-pay

## 2023-09-30 ENCOUNTER — Other Ambulatory Visit: Payer: Self-pay | Admitting: Critical Care Medicine

## 2023-09-30 DIAGNOSIS — E782 Mixed hyperlipidemia: Secondary | ICD-10-CM

## 2023-10-01 ENCOUNTER — Other Ambulatory Visit: Payer: Self-pay

## 2023-10-01 ENCOUNTER — Other Ambulatory Visit (HOSPITAL_COMMUNITY): Payer: Self-pay

## 2023-10-01 MED ORDER — ATORVASTATIN CALCIUM 20 MG PO TABS
20.0000 mg | ORAL_TABLET | Freq: Every day | ORAL | 0 refills | Status: DC
  Filled 2023-10-01 (×2): qty 90, 90d supply, fill #0

## 2023-10-02 ENCOUNTER — Other Ambulatory Visit: Payer: Self-pay

## 2023-10-10 ENCOUNTER — Ambulatory Visit (INDEPENDENT_AMBULATORY_CARE_PROVIDER_SITE_OTHER): Payer: No Payment, Other | Admitting: Student

## 2023-10-10 ENCOUNTER — Other Ambulatory Visit: Payer: Self-pay

## 2023-10-10 VITALS — BP 141/111 | HR 69 | Ht 63.0 in | Wt 146.4 lb

## 2023-10-10 DIAGNOSIS — F1491 Cocaine use, unspecified, in remission: Secondary | ICD-10-CM | POA: Diagnosis not present

## 2023-10-10 DIAGNOSIS — F3341 Major depressive disorder, recurrent, in partial remission: Secondary | ICD-10-CM | POA: Diagnosis not present

## 2023-10-10 DIAGNOSIS — F411 Generalized anxiety disorder: Secondary | ICD-10-CM | POA: Diagnosis not present

## 2023-10-10 DIAGNOSIS — F1091 Alcohol use, unspecified, in remission: Secondary | ICD-10-CM | POA: Diagnosis not present

## 2023-10-10 MED ORDER — MIRTAZAPINE 45 MG PO TBDP
45.0000 mg | ORAL_TABLET | Freq: Every day | ORAL | 2 refills | Status: DC
  Filled 2023-10-10 – 2023-10-23 (×3): qty 30, 30d supply, fill #0
  Filled 2023-11-18 – 2023-11-19 (×2): qty 30, 30d supply, fill #1

## 2023-10-10 MED ORDER — DESVENLAFAXINE SUCCINATE ER 25 MG PO TB24
75.0000 mg | ORAL_TABLET | Freq: Every day | ORAL | 2 refills | Status: DC
  Filled 2023-10-10 – 2023-10-23 (×3): qty 90, 30d supply, fill #0
  Filled 2023-11-22: qty 90, 30d supply, fill #1

## 2023-10-10 MED ORDER — HYDROXYZINE HCL 50 MG PO TABS
50.0000 mg | ORAL_TABLET | Freq: Three times a day (TID) | ORAL | 2 refills | Status: DC | PRN
  Filled 2023-10-10 – 2023-10-23 (×4): qty 90, 30d supply, fill #0
  Filled 2023-11-18 – 2023-11-19 (×2): qty 90, 30d supply, fill #1

## 2023-10-10 NOTE — Progress Notes (Addendum)
BH MD/PA/NP OP Progress Note    10/10/2023 1:09 PM  Margaret Mccann  MRN:  [id number]  Chief Complaint: MM follow-up    HPI: 53 year old female seen today for follow-up psychiatric evaluation.  She has a psychiatric history of depression, PTSD, GAD, substance use (tobacco, cocaine, alcohol).  Currently she is managed on gabapentin 600 mg three times daily, Remeron 45 mg nightly, Pristiq 75 mg daily,  hydroxyzine 50 mg 3 times daily, propanolol 10 mg twice daily and clonidine 0.2 mg nightly. She notes her medications a somewhat effective in managing her psychiatric conditions.  Things have been "much better." Her sleep, anxiety, and appetite have improved with increase in gabapentin. She is sleeping well through the night. She does feel well rested the next day. She is eating a normal amount since her anxiety is improved. Anxiety is improved in physical sx. She has more of a sense of ease.   She is still engaging in coping mechanisms, which are helpful.   She followed RX according to advising of pharmacist to take with new instructions.   Denies cravings and continues to maintain her sobriety. Sponsor also notices a difference. Meetings 3x/week and weekly meetings with sponsor.   Denies tobacco- 6-8 cigarettes. Denies vaping. Alcohol: Last time 7-8 months ago  Illicit: Denies current cocaine use (last time was 3-4 years ago). Denies current illicit substance use.   Today she denies SI/HI/AVH, mania, paranoia.     Visit Diagnosis:    ICD-10-CM   1. GAD (generalized anxiety disorder)  F41.1 desvenlafaxine (PRISTIQ) 25 MG 24 hr tablet    mirtazapine (REMERON SOL-TAB) 45 MG disintegrating tablet    hydrOXYzine (ATARAX) 50 MG tablet    2. Major depressive disorder, recurrent episode, in partial remission with anxious distress (HCC)  F33.41 desvenlafaxine (PRISTIQ) 25 MG 24 hr tablet    mirtazapine (REMERON SOL-TAB) 45 MG disintegrating tablet    hydrOXYzine (ATARAX) 50 MG  tablet        Past Psychiatric History:  MDD , anxiety, PTSD, Substance abuse (cocaine, alcohol)  Past Medical History:  Past Medical History:  Diagnosis Date   Alcohol withdrawal seizure with complication, with unspecified complication (HCC) 12/23/2020   Anxiety    Bipolar 1 disorder (HCC)    Depression    Intentional drug overdose (HCC) 08/04/2020   Major depressive disorder, recurrent episode with anxious distress (HCC) 08/11/2020   Morgellons syndrome    MRSA (methicillin resistant Staphylococcus aureus)    Suicide attempt (HCC)    Suicide by drug overdose (HCC) 08/11/2020   Tricuspid valve regurgitation 12/18/2020    Past Surgical History:  Procedure Laterality Date   APPENDECTOMY      Family Psychiatric History: Sister - committed suicide in 2005 by overdosing.  Family History:  Family History  Problem Relation Age of Onset   Heart attack Father 26   Stroke Father    Severe combined immunodeficiency Sister    Breast cancer Paternal Aunt    Ulcerative colitis Neg Hx    Esophageal cancer Neg Hx     Social History:  Social History   Socioeconomic History   Marital status: Single    Spouse name: Not on file   Number of children: Not on file   Years of education: Not on file   Highest education level: Not on file  Occupational History   Not on file  Tobacco Use   Smoking status: Some Days    Current packs/day: 0.25    Average packs/day:  0.3 packs/day for 5.0 years (1.3 ttl pk-yrs)    Types: Cigarettes   Smokeless tobacco: Never  Vaping Use   Vaping status: Never Used  Substance and Sexual Activity   Alcohol use: Yes    Comment: occas   Drug use: Not Currently    Types: Cocaine    Comment: relapsed the past weekend, no cocaine use prior 6 months   Sexual activity: Not Currently    Birth control/protection: Pill  Other Topics Concern   Not on file  Social History Narrative   ** Merged History Encounter **       Social Drivers of Health    Financial Resource Strain: Not on file  Food Insecurity: No Food Insecurity (07/18/2022)   Hunger Vital Sign    Worried About Running Out of Food in the Last Year: Never true    Ran Out of Food in the Last Year: Never true  Transportation Needs: No Transportation Needs (07/18/2022)   PRAPARE - Administrator, Civil Service (Medical): No    Lack of Transportation (Non-Medical): No  Physical Activity: Not on file  Stress: Not on file  Social Connections: Not on file    Allergies:  Allergies  Allergen Reactions   Lamictal [Lamotrigine] Anaphylaxis, Rash and Other (See Comments)    Stevens-Johnson syndrome and fevers, also   Lamictal [Lamotrigine] Anaphylaxis, Rash and Other (See Comments)    Fevers and Stevens-Johnson syndrome, also   Tramadol Other (See Comments)    Pt had a seizure after taking Tramadol!!   Geodon [Ziprasidone Hcl] Rash   Levetiracetam Anxiety    Metabolic Disorder Labs: Lab Results  Component Value Date   HGBA1C 6.0 (H) 01/12/2023   MPG 116.89 07/20/2022   MPG 114.02 12/19/2020   No results found for: "PROLACTIN" Lab Results  Component Value Date   CHOL 199 07/20/2022   TRIG 117 07/20/2022   HDL 58 07/20/2022   CHOLHDL 3.4 07/20/2022   VLDL 23 07/20/2022   LDLCALC 118 (H) 07/20/2022   LDLCALC 69 05/16/2021   Lab Results  Component Value Date   TSH 3.544 07/20/2022   TSH 1.300 05/16/2021    Therapeutic Level Labs: No results found for: "LITHIUM" No results found for: "VALPROATE" No results found for: "CBMZ"  Current Medications: Current Outpatient Medications  Medication Sig Dispense Refill   atorvastatin (LIPITOR) 20 MG tablet Take 1 tablet (20 mg total) by mouth at bedtime. 90 tablet 0   cloNIDine (CATAPRES) 0.2 MG tablet Take 1 tablet (0.2 mg total) by mouth at bedtime. 30 tablet 3   desvenlafaxine (PRISTIQ) 25 MG 24 hr tablet Take 3 tablets (75 mg total) by mouth daily. 90 tablet 3   gabapentin (NEURONTIN) 600 MG tablet  Take 1 tablet (600 mg total) by mouth in the morning, at noon, in the evening, and at bedtime. 120 tablet 1   hydrOXYzine (ATARAX) 50 MG tablet Take 1 tablet (50 mg total) by mouth 3 (three) times daily as needed for anxiety. 90 tablet 0   mirtazapine (REMERON SOL-TAB) 45 MG disintegrating tablet Take 1 tablet (45 mg total) by mouth at bedtime. 30 tablet 0   norethindrone (INCASSIA) 0.35 MG tablet Take 1 tablet (0.35 mg total) by mouth daily. 84 tablet 0   pantoprazole (PROTONIX) 40 MG tablet Take 1 tablet (40 mg total) by mouth 2 (two) times daily before a meal. 180 tablet 0   promethazine (PHENERGAN) 12.5 MG tablet Take 1 tablet (12.5 mg total) by mouth every  8 (eight) hours as needed for nausea or vomiting. 40 tablet 1   propranolol (INDERAL) 10 MG tablet Take 1 tablet (10 mg total) by mouth 2 (two) times daily. 60 tablet 3   valsartan (DIOVAN) 80 MG tablet Take 1 tablet (80 mg total) by mouth daily. 90 tablet 1   No current facility-administered medications for this visit.     Musculoskeletal: Strength & Muscle Tone: within normal limits Gait & Station: normal Patient leans: N/A  Psychiatric Specialty Exam: Review of Systems  Last menstrual period 10/27/2019.There is no height or weight on file to calculate BMI.  General Appearance: Fairly Groomed  Eye Contact:  Good  Speech:  Clear and Coherent and Normal Rate  Volume:  Normal  Mood:  Euthymic, less anxious and depressed  Affect:  Appropriate and Congruent  Thought Process:  Coherent, Goal Directed, and Linear  Orientation:  Full (Time, Place, and Person)  Thought Content: WDL and Logical   Suicidal Thoughts:  No  Homicidal Thoughts:  No  Memory:  Immediate;   Good Recent;   Good Remote;   Good  Judgement:  Good  Insight:  Good  Psychomotor Activity:  Normal  Concentration:  Concentration: Good and Attention Span: Good  Recall:  Good  Fund of Knowledge: Good  Language: Good  Akathisia:  No  Handed:  Right  AIMS (if  indicated): not done  Assets:  Communication Skills Desire for Improvement Financial Resources/Insurance Housing  ADL's:  Intact  Cognition: WNL  Sleep:  Good   Screenings: AIMS    Flowsheet Row [facility]  AIMS Total Score 0      AUDIT    Flowsheet Row [facility]) [facility] Ad[facility] ([facility]  Alcohol Use Disorder Identification Test Final Score (AUDIT) 6 11 0      GAD-7    Flowsheet Row Office Visit from 06/12/2023 in [facility] [place] [facility] [place]. [facility] Clinical Support from 06/04/2023 in [facility] Office Visit from 01/09/2023 in [facility] [place] [facility] [place]. [facility] Video Visit from 07/31/2022 in [facility] Video Visit from 06/15/2022 in [facility]  Total GAD-7 Score 3 12 11 9 4       PHQ2-9    Flowsheet Row Office Visit from 06/12/2023 in [facility] [place] [facility] [place]. [facility] Clinical Support from 06/04/2023 in [facility] Office Visit from 01/09/2023 in [facility] [place] - [facility] [place]. [facility] Video Visit from 07/31/2022 in [facility] Video Visit from 06/15/2022 in [facility]  PHQ-2 Total Score 0 0 0 1 0  PHQ-9 Total Score -- 10 -- 6 0      Flowsheet Row [facility]) [facility] ED from 01/12/2022 in [facility] Emergency Department at [facility] Video Visit from 08/09/2021 in [facility]  C-SSRS RISK CATEGORY No Risk No Risk No Risk        Assessment and Plan: Patient reports improvement in her anxiety and sleep with the increase in gabapentin.  As well, her depressive symptoms remain improved.  PDMP shows 20-day supply of gabapentin 600 mg 3 times daily prior to obtaining 30-day supply of gabapentin 600 mg 4 times daily.  She informed that the pharmacist advised her to follow the new prescription instructions on the prescription available at the pharmacy at the time.  She has been compliant with medication and has not been misusing it.  Her gabapentin is to reduce anxiety, particularly surrounding alcohol consumption.  We discussed other options including naltrexone and acamprosate, particularly the latter since she has had months of sobriety, but she still declines at this time, stating that her cravings are well controlled on current regimen.  Patient poses no safety concerns toward herself nor others at this time.  1. GAD (generalized anxiety disorder)  - Continue mirtazapine 45 MG at bedtime. - Continue- hydrOXYzine 50 MG 3 (three) times daily as needed for anxiety.   - Continue- desvenlafaxine (PRISTIQ) 75 mg daily.  - Continue gabapentin 600 mg QID  2. Major depressive disorder, recurrent episode, in partial remission with anxious distress (HCC)  - Continue mirtazapine 45 MG at bedtime. - Continue- hydrOXYzine 50 MG 3 (three) times daily as needed for anxiety.   - Continue- desvenlafaxine (PRISTIQ) 75 mg daily.   3. Primary hypertension  Continue cloNIDine 0.2 MG at bedtime.  Continue- propranolol 10 MG tablet 2 (two) times daily.  -Advised PCP follow-up   Follow up in 4-5 weeks  Lamar Sprinkles, MD 10/10/2023 1:09 PM

## 2023-10-15 ENCOUNTER — Other Ambulatory Visit: Payer: Self-pay

## 2023-10-15 ENCOUNTER — Other Ambulatory Visit (HOSPITAL_COMMUNITY): Payer: Self-pay

## 2023-10-17 ENCOUNTER — Other Ambulatory Visit (HOSPITAL_COMMUNITY): Payer: Self-pay

## 2023-10-17 ENCOUNTER — Other Ambulatory Visit: Payer: Self-pay

## 2023-10-18 ENCOUNTER — Encounter (HOSPITAL_COMMUNITY): Payer: Self-pay | Admitting: Student

## 2023-10-22 ENCOUNTER — Other Ambulatory Visit: Payer: Self-pay

## 2023-10-23 ENCOUNTER — Other Ambulatory Visit: Payer: Self-pay

## 2023-10-24 ENCOUNTER — Other Ambulatory Visit: Payer: Self-pay

## 2023-11-01 ENCOUNTER — Other Ambulatory Visit: Payer: Self-pay | Admitting: Critical Care Medicine

## 2023-11-01 DIAGNOSIS — R110 Nausea: Secondary | ICD-10-CM

## 2023-11-02 ENCOUNTER — Other Ambulatory Visit: Payer: Self-pay

## 2023-11-02 MED ORDER — PROMETHAZINE HCL 12.5 MG PO TABS
12.5000 mg | ORAL_TABLET | Freq: Three times a day (TID) | ORAL | 0 refills | Status: DC | PRN
  Filled 2023-11-02 – 2023-11-05 (×3): qty 40, 14d supply, fill #0

## 2023-11-05 ENCOUNTER — Other Ambulatory Visit: Payer: Self-pay

## 2023-11-05 ENCOUNTER — Other Ambulatory Visit (HOSPITAL_COMMUNITY): Payer: Self-pay

## 2023-11-11 ENCOUNTER — Other Ambulatory Visit (HOSPITAL_COMMUNITY): Payer: Self-pay | Admitting: Psychiatry

## 2023-11-12 ENCOUNTER — Other Ambulatory Visit: Payer: Self-pay

## 2023-11-12 ENCOUNTER — Other Ambulatory Visit (HOSPITAL_COMMUNITY): Payer: Self-pay | Admitting: Psychiatry

## 2023-11-12 MED ORDER — GABAPENTIN 600 MG PO TABS
600.0000 mg | ORAL_TABLET | Freq: Four times a day (QID) | ORAL | 0 refills | Status: DC
  Filled 2023-11-12 – 2023-11-15 (×2): qty 120, 30d supply, fill #0

## 2023-11-13 ENCOUNTER — Other Ambulatory Visit: Payer: Self-pay

## 2023-11-14 ENCOUNTER — Other Ambulatory Visit: Payer: Self-pay

## 2023-11-15 ENCOUNTER — Other Ambulatory Visit: Payer: Self-pay

## 2023-11-16 ENCOUNTER — Other Ambulatory Visit: Payer: Self-pay

## 2023-11-19 ENCOUNTER — Other Ambulatory Visit: Payer: Self-pay

## 2023-11-20 ENCOUNTER — Other Ambulatory Visit: Payer: Self-pay

## 2023-11-21 ENCOUNTER — Other Ambulatory Visit: Payer: Self-pay

## 2023-11-23 ENCOUNTER — Other Ambulatory Visit: Payer: Self-pay

## 2023-11-25 ENCOUNTER — Other Ambulatory Visit: Payer: Self-pay

## 2023-11-25 ENCOUNTER — Encounter (HOSPITAL_BASED_OUTPATIENT_CLINIC_OR_DEPARTMENT_OTHER): Payer: Self-pay

## 2023-11-25 ENCOUNTER — Emergency Department (HOSPITAL_BASED_OUTPATIENT_CLINIC_OR_DEPARTMENT_OTHER)
Admission: EM | Admit: 2023-11-25 | Discharge: 2023-11-25 | Disposition: A | Discharge status: Home / Self Care | Payer: Self-pay | Attending: Emergency Medicine | Admitting: Emergency Medicine

## 2023-11-25 DIAGNOSIS — J069 Acute upper respiratory infection, unspecified: Secondary | ICD-10-CM

## 2023-11-25 DIAGNOSIS — R059 Cough, unspecified: Secondary | ICD-10-CM | POA: Insufficient documentation

## 2023-11-25 DIAGNOSIS — R519 Headache, unspecified: Secondary | ICD-10-CM | POA: Insufficient documentation

## 2023-11-25 DIAGNOSIS — I10 Essential (primary) hypertension: Secondary | ICD-10-CM | POA: Insufficient documentation

## 2023-11-25 DIAGNOSIS — Z79899 Other long term (current) drug therapy: Secondary | ICD-10-CM | POA: Insufficient documentation

## 2023-11-25 DIAGNOSIS — J029 Acute pharyngitis, unspecified: Secondary | ICD-10-CM | POA: Insufficient documentation

## 2023-11-25 LAB — RESP PANEL BY RT-PCR (RSV, FLU A&B, COVID)  RVPGX2
Influenza A by PCR: NEGATIVE
Influenza B by PCR: NEGATIVE
Resp Syncytial Virus by PCR: NEGATIVE
SARS Coronavirus 2 by RT PCR: NEGATIVE

## 2023-11-25 LAB — GROUP A STREP BY PCR: Group A Strep by PCR: NOT DETECTED

## 2023-11-25 MED ORDER — KETOROLAC TROMETHAMINE 60 MG/2ML IM SOLN
60.0000 mg | Freq: Once | INTRAMUSCULAR | Status: AC
  Administered 2023-11-25: 60 mg via INTRAMUSCULAR
  Filled 2023-11-25: qty 2

## 2023-11-25 NOTE — ED Triage Notes (Signed)
 Pt arrived from home via POV c.o sore throat, cough, and body aches since Sunday. Pt states that she has never been this sick before

## 2023-11-25 NOTE — ED Provider Notes (Signed)
 [place] [facility] Provider Note   CSN: [id number] Arrival date & time: 11/25/23  0439     History  Chief Complaint  Patient presents with   Sore Throat    Margaret Mccann is a 53 y.o. female.  Is a 53 year old female with past medical history of depression, PTSD, anxiety, hypertension, hyperlipidemia.  Patient presenting today with complaints of sore throat, cough, headache, and feeling generally unwell for the past week.  She reports taking Tylenol and Motrin throughout the week with no relief.  She denies any ill contacts.  She also complains of severe headache that is unrelieved with over-the-counter medications.  The history is provided by the patient.       Home Medications Prior to Admission medications   Medication Sig Start Date End Date Taking? Authorizing Provider  atorvastatin (LIPITOR) 20 MG tablet Take 1 tablet (20 mg total) by mouth at bedtime. 10/01/23   Storm Frisk, MD  cloNIDine (CATAPRES) 0.2 MG tablet Take 1 tablet (0.2 mg total) by mouth at bedtime. 08/14/23   Shanna Cisco, NP  desvenlafaxine (PRISTIQ) 25 MG 24 hr tablet Take 3 tablets (75 mg total) by mouth daily. 10/10/23   Bahraini, Sarah A  gabapentin (NEURONTIN) 600 MG tablet Take 1 tablet (600 mg total) by mouth in the morning, at noon, in the evening, and at bedtime. 11/14/23 12/15/23  [place]i, Sarah A  hydrOXYzine (ATARAX) 50 MG tablet Take 1 tablet (50 mg total) by mouth 3 (three) times daily as needed for anxiety. 10/10/23   [place]i, Sarah A  mirtazapine (REMERON SOL-TAB) 45 MG disintegrating tablet Take 1 tablet (45 mg total) by mouth at bedtime. 10/10/23   [place]i, Sarah A  norethindrone (INCASSIA) 0.35 MG tablet Take 1 tablet (0.35 mg total) by mouth daily. 09/26/23   Storm Frisk, MD  pantoprazole (PROTONIX) 40 MG tablet Take 1 tablet (40 mg total) by mouth 2 (two) times daily before a meal. 08/29/23   Hoy Register, MD  promethazine  (PHENERGAN) 12.5 MG tablet Take 1 tablet (12.5 mg total) by mouth every 8 (eight) hours as needed for nausea or vomiting. 11/02/23   Storm Frisk, MD  propranolol (INDERAL) 10 MG tablet Take 1 tablet (10 mg total) by mouth 2 (two) times daily. 08/14/23   Shanna Cisco, NP  valsartan (DIOVAN) 80 MG tablet Take 1 tablet (80 mg total) by mouth daily. 06/12/23   Storm Frisk, MD      Allergies    Lamictal [lamotrigine], Lamictal [lamotrigine], Tramadol, Geodon [ziprasidone hcl], and Levetiracetam    Review of Systems   Review of Systems  All other systems reviewed and are negative.   Physical Exam Updated Vital Signs Ht 5\' 3"  (1.6 m)   Wt 66.4 kg   LMP 10/27/2019   BMI 25.93 kg/m  Physical Exam Vitals and nursing note reviewed.  Constitutional:      General: She is not in acute distress.    Appearance: She is well-developed. She is not diaphoretic.  HENT:     Head: Normocephalic and atraumatic.     Right Ear: Tympanic membrane normal.     Left Ear: Tympanic membrane normal.     Nose: No congestion or rhinorrhea.     Mouth/Throat:     Mouth: Mucous membranes are moist.     Tonsils: No tonsillar exudate or tonsillar abscesses.  Cardiovascular:     Rate and Rhythm: Normal rate and regular rhythm.  Heart sounds: No murmur heard.    No friction rub. No gallop.  Pulmonary:     Effort: Pulmonary effort is normal. No respiratory distress.     Breath sounds: Normal breath sounds. No wheezing.  Abdominal:     General: Bowel sounds are normal. There is no distension.     Palpations: Abdomen is soft.     Tenderness: There is no abdominal tenderness.  Musculoskeletal:        General: Normal range of motion.     Cervical back: Normal range of motion and neck supple.  Skin:    General: Skin is warm and dry.  Neurological:     General: No focal deficit present.     Mental Status: She is alert and oriented to person, place, and time.     ED Results / Procedures /  Treatments   Labs (all labs ordered are listed, but only abnormal results are displayed) Labs Reviewed - No data to display  EKG None  Radiology No results found.  Procedures Procedures    Medications Ordered in ED Medications  ketorolac (TORADOL) injection 60 mg (has no administration in time range)    ED Course/ Medical Decision Making/ A&P  Patient is a 53 year old female presenting with complaints of sore throat, headache, earache, swollen glands, and feeling generally unwell.  She has been taking Tylenol and ibuprofen with little help.  Patient arrives here with stable vital signs and is afebrile.  Physical examination is unremarkable.  COVID/flu/RSV/strep all negative, but still suspect a viral etiology.  She was given IM Toradol for her pain and seems to be feeling somewhat better.  I will recommend Sudafed for her congestion and continued use of Tylenol and Motrin for pain or fever.  Patient is to follow-up as needed if not improving.  Final Clinical Impression(s) / ED Diagnoses Final diagnoses:  None    Rx / DC Orders ED Discharge Orders     None         Geoffery Lyons, MD 11/25/23 [phone]

## 2023-11-25 NOTE — Discharge Instructions (Addendum)
 Drink plenty of fluids and get plenty of rest.  Take Tylenol 1000 mg rotated with ibuprofen 600 mg every 3 hours as needed for pain or fever.  Take Sudafed as needed for congestion.  This medication is available over-the-counter, but must be requested at the pharmacy.  Follow-up with primary doctor if not improving in the next week.

## 2023-11-26 ENCOUNTER — Other Ambulatory Visit: Payer: Self-pay | Admitting: Family Medicine

## 2023-11-26 MED ORDER — PANTOPRAZOLE SODIUM 40 MG PO TBEC
40.0000 mg | DELAYED_RELEASE_TABLET | Freq: Two times a day (BID) | ORAL | 0 refills | Status: DC
  Filled 2023-11-26 – 2023-11-28 (×3): qty 60, 30d supply, fill #0

## 2023-11-27 ENCOUNTER — Other Ambulatory Visit: Payer: Self-pay

## 2023-11-27 ENCOUNTER — Other Ambulatory Visit (HOSPITAL_COMMUNITY): Payer: Self-pay

## 2023-11-28 ENCOUNTER — Telehealth (HOSPITAL_COMMUNITY): Payer: No Payment, Other | Admitting: Student

## 2023-11-28 ENCOUNTER — Other Ambulatory Visit (HOSPITAL_COMMUNITY): Payer: Self-pay | Admitting: Psychiatry

## 2023-11-28 ENCOUNTER — Other Ambulatory Visit: Payer: Self-pay

## 2023-11-28 DIAGNOSIS — I10 Essential (primary) hypertension: Secondary | ICD-10-CM

## 2023-11-29 ENCOUNTER — Other Ambulatory Visit (HOSPITAL_COMMUNITY): Payer: Self-pay

## 2023-11-29 ENCOUNTER — Other Ambulatory Visit: Payer: Self-pay | Admitting: Critical Care Medicine

## 2023-11-29 ENCOUNTER — Other Ambulatory Visit: Payer: Self-pay

## 2023-11-29 DIAGNOSIS — R110 Nausea: Secondary | ICD-10-CM

## 2023-11-30 ENCOUNTER — Other Ambulatory Visit: Payer: Self-pay | Admitting: Critical Care Medicine

## 2023-11-30 ENCOUNTER — Encounter (HOSPITAL_COMMUNITY): Payer: Self-pay | Admitting: Student

## 2023-11-30 ENCOUNTER — Other Ambulatory Visit: Payer: Self-pay

## 2023-11-30 ENCOUNTER — Telehealth (HOSPITAL_COMMUNITY): Admitting: Student

## 2023-11-30 DIAGNOSIS — R110 Nausea: Secondary | ICD-10-CM

## 2023-11-30 DIAGNOSIS — F411 Generalized anxiety disorder: Secondary | ICD-10-CM

## 2023-11-30 DIAGNOSIS — F3341 Major depressive disorder, recurrent, in partial remission: Secondary | ICD-10-CM | POA: Diagnosis not present

## 2023-11-30 DIAGNOSIS — I10 Essential (primary) hypertension: Secondary | ICD-10-CM

## 2023-11-30 MED ORDER — MIRTAZAPINE 45 MG PO TBDP
45.0000 mg | ORAL_TABLET | Freq: Every day | ORAL | 2 refills | Status: DC
  Filled 2023-11-30 – 2023-12-17 (×3): qty 30, 30d supply, fill #0
  Filled 2024-01-09: qty 30, 30d supply, fill #1
  Filled 2024-02-04: qty 30, 30d supply, fill #2

## 2023-11-30 MED ORDER — CLONIDINE HCL 0.2 MG PO TABS
0.2000 mg | ORAL_TABLET | Freq: Every day | ORAL | 2 refills | Status: DC
  Filled 2023-11-30: qty 30, 30d supply, fill #0
  Filled 2023-12-27 (×3): qty 30, 30d supply, fill #1
  Filled 2024-01-20: qty 30, 30d supply, fill #2

## 2023-11-30 MED ORDER — DESVENLAFAXINE SUCCINATE ER 25 MG PO TB24
75.0000 mg | ORAL_TABLET | Freq: Every day | ORAL | 2 refills | Status: DC
  Filled 2023-11-30 – 2023-12-22 (×2): qty 90, 30d supply, fill #0
  Filled 2024-01-20: qty 90, 30d supply, fill #1

## 2023-11-30 MED ORDER — PROPRANOLOL HCL 10 MG PO TABS
10.0000 mg | ORAL_TABLET | Freq: Two times a day (BID) | ORAL | 2 refills | Status: DC
  Filled 2023-11-30 – 2023-12-16 (×2): qty 60, 30d supply, fill #0

## 2023-11-30 MED ORDER — GABAPENTIN 600 MG PO TABS
600.0000 mg | ORAL_TABLET | Freq: Four times a day (QID) | ORAL | 1 refills | Status: DC
Start: 2023-11-30 — End: 2024-02-05
  Filled 2023-11-30 – 2023-12-13 (×3): qty 120, 30d supply, fill #0
  Filled 2024-01-09: qty 120, 30d supply, fill #1

## 2023-11-30 MED ORDER — HYDROXYZINE HCL 50 MG PO TABS
50.0000 mg | ORAL_TABLET | Freq: Three times a day (TID) | ORAL | 2 refills | Status: DC | PRN
  Filled 2023-11-30 – 2023-12-17 (×3): qty 90, 30d supply, fill #0
  Filled 2024-01-09 – 2024-01-10 (×2): qty 90, 30d supply, fill #1
  Filled 2024-02-04: qty 90, 30d supply, fill #2

## 2023-11-30 NOTE — Progress Notes (Signed)
 Televisit via video: I connected with patient on 11/30/23 at  9:30 AM EST by a video enabled telemedicine application and verified that I am speaking with the correct person using two identifiers.  Location: Patient: Home Provider: Office   I discussed the limitations of evaluation and management by telemedicine and the availability of in person appointments. The patient expressed understanding and agreed to proceed.  I discussed the assessment and treatment plan with the patient. The patient was provided an opportunity to ask questions and all were answered. The patient agreed with the plan and demonstrated an understanding of the instructions.   The patient was advised to call back or seek an in-person evaluation if the symptoms worsen or if the condition fails to improve as anticipated.  I spent 20 minutes in direct patient care.   BH MD/PA/NP OP Progress Note    11/30/2023 12:20 PM  Margaret Mccann  MRN:  [id number]  Chief Complaint: MM follow-up    HPI: 53 year old female seen today for follow-up psychiatric evaluation.  She has a psychiatric history of depression, PTSD, GAD, substance use (tobacco, cocaine, alcohol).  Currently she is managed on gabapentin 600 mg three times daily, Remeron 45 mg nightly, Pristiq 75 mg daily,  hydroxyzine 50 mg 3 times daily, propanolol 10 mg twice daily and clonidine 0.2 mg nightly. She notes her medications a somewhat effective in managing her psychiatric conditions.  She has been feeling physically unwell due to a URI. Testing was negative. She will be seeing PCP next week.   She received news that her stepfather has bladder cancer. He has dealt with other types of cancer. She has been supporting her mom, and stepfather has been anxious. Found out three days ago, so it has not yet really affected her mood at this time.  Taking hydroxyzine BID.  She is medication compliant with other scheduled medications.   BP has normalized on home  checks. PCP was rescheduled.   Things remain well in terms of mood. Her sleep, anxiety, and appetite remain intact with increase in gabapentin. She is sleeping well through the night. She does feel well rested the next day. She is eating a normal amount since her anxiety is improved. Anxiety is improved in physical sx. She has more of a sense of ease.     Denies cravings and continues to maintain her sobriety. Sponsor also notices a difference. Meetings 3x/week and weekly meetings with sponsor.   Denies tobacco- 6-8 cigarettes. Denies vaping. Alcohol: Last time 7-8 months ago  Illicit: Denies current cocaine use (last time was 3-4 years ago). Denies current illicit substance use.   Today she denies SI/HI/AVH, mania, paranoia.     Visit Diagnosis:    ICD-10-CM   1. Primary hypertension  I10 cloNIDine (CATAPRES) 0.2 MG tablet    propranolol (INDERAL) 10 MG tablet    2. GAD (generalized anxiety disorder)  F41.1 desvenlafaxine (PRISTIQ) 25 MG 24 hr tablet    hydrOXYzine (ATARAX) 50 MG tablet    mirtazapine (REMERON SOL-TAB) 45 MG disintegrating tablet    3. Major depressive disorder, recurrent episode, in partial remission with anxious distress (HCC)  F33.41 desvenlafaxine (PRISTIQ) 25 MG 24 hr tablet    hydrOXYzine (ATARAX) 50 MG tablet    mirtazapine (REMERON SOL-TAB) 45 MG disintegrating tablet         Past Psychiatric History:  MDD , anxiety, PTSD, Substance abuse (cocaine, alcohol)  Past Medical History:  Past Medical History:  Diagnosis Date   Alcohol  withdrawal seizure with complication, with unspecified complication (HCC) 12/23/2020   Anxiety    Bipolar 1 disorder (HCC)    Depression    Intentional drug overdose (HCC) 08/04/2020   Major depressive disorder, recurrent episode with anxious distress (HCC) 08/11/2020   Morgellons syndrome    MRSA (methicillin resistant Staphylococcus aureus)    Suicide attempt [facility])    Suicide by drug overdose (HCC) 08/11/2020    Tricuspid valve regurgitation 12/18/2020    Past Surgical History:  Procedure Laterality Date   APPENDECTOMY      Family Psychiatric History: Sister - committed suicide in 2005 by overdosing.  Family History:  Family History  Problem Relation Age of Onset   Heart attack Father 72   Stroke Father    Severe combined immunodeficiency Sister    Breast cancer Paternal Aunt    Ulcerative colitis Neg Hx    Esophageal cancer Neg Hx     Social History:  Social History   Socioeconomic History   Marital status: Single    Spouse name: Not on file   Number of children: Not on file   Years of education: Not on file   Highest education level: Not on file  Occupational History   Not on file  Tobacco Use   Smoking status: Some Days    Current packs/day: 0.25    Average packs/day: 0.3 packs/day for 5.0 years (1.3 ttl pk-yrs)    Types: Cigarettes   Smokeless tobacco: Never  Vaping Use   Vaping status: Never Used  Substance and Sexual Activity   Alcohol use: Yes    Comment: occas   Drug use: Not Currently    Types: Cocaine    Comment: relapsed the past weekend, no cocaine use prior 6 months   Sexual activity: Not Currently    Birth control/protection: Pill  Other Topics Concern   Not on file  Social History Narrative   ** Merged History Encounter **       Social Drivers of Health   Financial Resource Strain: Not on file  Food Insecurity: No Food Insecurity (07/18/2022)   Hunger Vital Sign    Worried About Running Out of Food in the Last Year: Never true    Ran Out of Food in the Last Year: Never true  Transportation Needs: No Transportation Needs (07/18/2022)   PRAPARE - Administrator, Civil Service (Medical): No    Lack of Transportation (Non-Medical): No  Physical Activity: Not on file  Stress: Not on file  Social Connections: Not on file    Allergies:  Allergies  Allergen Reactions   Lamictal [Lamotrigine] Anaphylaxis, Rash and Other (See Comments)     Stevens-Johnson syndrome and fevers, also   Lamictal [Lamotrigine] Anaphylaxis, Rash and Other (See Comments)    Fevers and Stevens-Johnson syndrome, also   Tramadol Other (See Comments)    Pt had a seizure after taking Tramadol!!   Geodon [Ziprasidone Hcl] Rash   Levetiracetam Anxiety    Metabolic Disorder Labs: Lab Results  Component Value Date   HGBA1C 6.0 (H) 01/12/2023   MPG 116.89 07/20/2022   MPG 114.02 12/19/2020   No results found for: "PROLACTIN" Lab Results  Component Value Date   CHOL 199 07/20/2022   TRIG 117 07/20/2022   HDL 58 07/20/2022   CHOLHDL 3.4 07/20/2022   VLDL 23 07/20/2022   LDLCALC 118 (H) 07/20/2022   LDLCALC 69 05/16/2021   Lab Results  Component Value Date   TSH 3.544 07/20/2022  TSH 1.300 05/16/2021    Therapeutic Level Labs: No results found for: "LITHIUM" No results found for: "VALPROATE" No results found for: "CBMZ"  Current Medications: Current Outpatient Medications  Medication Sig Dispense Refill   atorvastatin (LIPITOR) 20 MG tablet Take 1 tablet (20 mg total) by mouth at bedtime. 90 tablet 0   norethindrone (INCASSIA) 0.35 MG tablet Take 1 tablet (0.35 mg total) by mouth daily. 84 tablet 0   pantoprazole (PROTONIX) 40 MG tablet Take 1 tablet (40 mg total) by mouth 2 (two) times daily before a meal. 60 tablet 0   promethazine (PHENERGAN) 12.5 MG tablet Take 1 tablet (12.5 mg total) by mouth every 8 (eight) hours as needed for nausea or vomiting. 40 tablet 0   valsartan (DIOVAN) 80 MG tablet Take 1 tablet (80 mg total) by mouth daily. 90 tablet 1   cloNIDine (CATAPRES) 0.2 MG tablet Take 1 tablet (0.2 mg total) by mouth at bedtime. 30 tablet 2   desvenlafaxine (PRISTIQ) 25 MG 24 hr tablet Take 3 tablets (75 mg total) by mouth daily. 90 tablet 2   gabapentin (NEURONTIN) 600 MG tablet Take 1 tablet (600 mg total) by mouth in the morning, at noon, in the evening, and at bedtime. 120 tablet 1   hydrOXYzine (ATARAX) 50 MG tablet  Take 1 tablet (50 mg total) by mouth 3 (three) times daily as needed for anxiety. 90 tablet 2   mirtazapine (REMERON SOL-TAB) 45 MG disintegrating tablet Take 1 tablet (45 mg total) by mouth at bedtime. 30 tablet 2   propranolol (INDERAL) 10 MG tablet Take 1 tablet (10 mg total) by mouth 2 (two) times daily. 60 tablet 2   No current facility-administered medications for this visit.     Musculoskeletal: Strength & Muscle Tone: within normal limits; limited by video visit Gait & Station:  Unable to assess on video visit Patient leans: N/A  Psychiatric Specialty Exam: Review of Systems  Last menstrual period 10/27/2019.There is no height or weight on file to calculate BMI.  General Appearance: Fairly Groomed  Eye Contact:  Good  Speech:  Clear and Coherent and Normal Rate  Volume:  Normal  Mood:  Euthymic  Affect:  Appropriate and Congruent  Thought Process:  Coherent, Goal Directed, and Linear  Orientation:  Full (Time, Place, and Person)  Thought Content: WDL and Logical   Suicidal Thoughts:  No  Homicidal Thoughts:  No  Memory:  Immediate;   Good Recent;   Good Remote;   Good  Judgement:  Good  Insight:  Good  Psychomotor Activity:  Normal  Concentration:  Concentration: Good and Attention Span: Good  Recall:  Good  Fund of Knowledge: Good  Language: Good  Akathisia:  No  Handed:  Right  AIMS (if indicated): not done  Assets:  Communication Skills Desire for Improvement Financial Resources/Insurance Housing  ADL's:  Intact  Cognition: WNL  Sleep:  Good   Screenings: AIMS    Flowsheet Row [facility]  AIMS Total Score 0      AUDIT    Flowsheet Row [facility]) [facility]  Alcohol Use Disorder Identification Test Final Score (AUDIT) 6 11 0      GAD-7    Flowsheet Row Office Visit from 06/12/2023 in [facility] [place] [facility] Of [place]. [facility] Clinical Support from 06/04/2023 in [facility] Office Visit from 01/09/2023 in [facility] [place] - A Dept Of [place]. [facility] Video Visit from 07/31/2022 in [facility] Video Visit from 06/15/2022 in [facility]  Total GAD-7 Score 3 12 11 9 4       PHQ2-9    Flowsheet Row Office Visit from 06/12/2023 in [facility] [place] - [facility] Dept Of [place]. [facility] Clinical Support from 06/04/2023 in [facility] Office Visit from 01/09/2023 in [facility] [place] - A Dept Of [place]. [facility] Video Visit from 07/31/2022 in [facility] Video Visit from 06/15/2022 in [facility]  PHQ-2 Total Score 0 0 0 1 0  PHQ-9 Total Score -- 10 -- 6 0      Flowsheet Row ED from 11/25/2023 in [facility] Department at [facility] Admission (Discharged) [facility] ED from 01/12/2022 in [facility] at [facility]  C-SSRS RISK CATEGORY No Risk No Risk No Risk        Assessment and Plan: Patient reports sustained improvement in her anxiety and sleep with the increase in gabapentin.  As well, her depressive symptoms remain improved. She has been compliant with medication and has not been misusing it.  Patient denies need to adjust medications at this time.  Her blood pressure has remained elevated, and she has felt physically unwell, and she will be following with her PCP next week.  She has been checking her blood pressure at home, and reports that it has since  normalized.  She is given criteria to present to the ED regarding her blood pressure and symptoms.  Patient poses no safety concerns toward herself nor others at this time.  From last visit: Her gabapentin is to reduce anxiety, particularly surrounding alcohol consumption.  We discussed other options including naltrexone and acamprosate, particularly the latter since she has had months of sobriety, but she still declines at this time, stating that her cravings are well controlled on current regimen.   1. GAD (generalized anxiety disorder)  - Continue mirtazapine 45 MG at bedtime. - Continue- hydrOXYzine 50 MG 3 (three) times daily as needed for anxiety (patient taking 2 times daily on average) - Continue- desvenlafaxine (PRISTIQ) 75 mg daily.  - Continue gabapentin 600 mg QID  2. Major depressive disorder, recurrent episode, in partial remission with anxious distress (HCC)  - Continue mirtazapine 45 MG at bedtime. - Continue- hydrOXYzine 50 MG 3 (three) times daily as needed for anxiety.   - Continue- desvenlafaxine (PRISTIQ) 75 mg daily.   3. Primary hypertension  Continue cloNIDine 0.2 MG at bedtime.  Continue- propranolol 10 MG tablet 2 (two) times daily.  -Advised PCP follow-up   Follow up in approximately 8 weeks   Lamar Sprinkles, MD 11/30/2023 12:20 PM

## 2023-12-04 ENCOUNTER — Telehealth: Payer: Self-pay

## 2023-12-04 ENCOUNTER — Other Ambulatory Visit: Payer: Self-pay

## 2023-12-04 ENCOUNTER — Encounter: Payer: Self-pay | Admitting: Pharmacist

## 2023-12-04 NOTE — Telephone Encounter (Signed)
 Call to patient as requested. Unable to reach VM left to  advise patient that she must be seen by a provider before refill can be authorized

## 2023-12-04 NOTE — Telephone Encounter (Signed)
 Copied from CRM [id number]. Topic: Clinical - Prescription Issue >> Dec 04, 2023 10:24 AM Margaret Mccann wrote: Reason for CRM: The refill for your prescription for promethazine has been denied by your provider.  Please contact your provider with any questions. Please schedule appointment with provider.// Earlier appointment was offered for today or tomorrow but patient declined//She would like a call back from the nurse

## 2023-12-05 ENCOUNTER — Telehealth: Payer: Self-pay

## 2023-12-05 NOTE — Telephone Encounter (Signed)
 Call to patient as requested. Unable to reach VM left to  advise patient that she must be seen by a provider before refill can be authorized

## 2023-12-05 NOTE — Telephone Encounter (Signed)
 Copied from CRM [id number]. Topic: General - Other >> Dec 04, 2023  6:01 PM Emylou G wrote: Reason for CRM: Please call patient back?  Unable to get refill and doesn't understand why and needs clarity.. [phone]

## 2023-12-10 ENCOUNTER — Other Ambulatory Visit: Payer: Self-pay

## 2023-12-11 ENCOUNTER — Ambulatory Visit: Payer: Self-pay | Admitting: Family Medicine

## 2023-12-12 ENCOUNTER — Other Ambulatory Visit: Payer: Self-pay

## 2023-12-13 ENCOUNTER — Other Ambulatory Visit: Payer: Self-pay

## 2023-12-14 ENCOUNTER — Other Ambulatory Visit: Payer: Self-pay

## 2023-12-14 ENCOUNTER — Other Ambulatory Visit (HOSPITAL_COMMUNITY): Payer: Self-pay

## 2023-12-14 ENCOUNTER — Other Ambulatory Visit (HOSPITAL_BASED_OUTPATIENT_CLINIC_OR_DEPARTMENT_OTHER): Payer: Self-pay

## 2023-12-16 ENCOUNTER — Other Ambulatory Visit: Payer: Self-pay | Admitting: Critical Care Medicine

## 2023-12-17 ENCOUNTER — Other Ambulatory Visit: Payer: Self-pay

## 2023-12-18 ENCOUNTER — Other Ambulatory Visit: Payer: Self-pay

## 2023-12-18 ENCOUNTER — Ambulatory Visit: Payer: Self-pay | Admitting: Family Medicine

## 2023-12-19 ENCOUNTER — Other Ambulatory Visit: Payer: Self-pay | Admitting: Critical Care Medicine

## 2023-12-19 ENCOUNTER — Other Ambulatory Visit: Payer: Self-pay

## 2023-12-20 ENCOUNTER — Other Ambulatory Visit: Payer: Self-pay | Admitting: Critical Care Medicine

## 2023-12-20 ENCOUNTER — Ambulatory Visit: Payer: Self-pay

## 2023-12-20 DIAGNOSIS — R110 Nausea: Secondary | ICD-10-CM

## 2023-12-20 DIAGNOSIS — I10 Essential (primary) hypertension: Secondary | ICD-10-CM

## 2023-12-20 DIAGNOSIS — E782 Mixed hyperlipidemia: Secondary | ICD-10-CM

## 2023-12-20 NOTE — Telephone Encounter (Signed)
 1st attempt, call cannot be completed as dialed.   Message from Alessandra Bevels sent at 12/20/2023  5:06 PM EDT  Summary: Nausea, vomitting, Diarhea Advice   Copied From CRM [id number]. Reason for Triage: Patient is calling to report Nausea and vomitting for 8 weeks. And diarrhea for 6 weeks. Please advise

## 2023-12-20 NOTE — Telephone Encounter (Signed)
 2nd call attempt. Unsuccessful. Routing for follow up.   Message from Alessandra Bevels sent at 12/20/2023  5:06 PM EDT  Summary: Nausea, vomitting, Diarhea Advice   Copied From CRM [id number]. Reason for Triage: Patient is calling to report Nausea and vomitting for 8 weeks. And diarrhea for 6 weeks. Please advise      Reason for Disposition . Second attempt to contact caller AND no contact made. Phone number verified.  Protocols used: No Contact or Duplicate Contact Call-A-AH

## 2023-12-20 NOTE — Telephone Encounter (Unsigned)
 Copied from CRM [id number]. Topic: Clinical - Medication Refill >> Dec 20, 2023  5:00 PM Alessandra Bevels wrote: Most Recent Primary Care Visit:  Provider: Storm Frisk  Department: [facility]  Visit Type: OFFICE VISIT  Date: 06/12/2023  Medication: Rx #: [id number]  valsartan (DIOVAN) 80 MG tablet [[id number]]   Rx #: [id number]  norethindrone (INCASSIA) 0.35 MG tablet   Rx #: [id number]  pantoprazole (PROTONIX) 40 MG tablet [[id number]]  Rx #: [id number]   promethazine (PHENERGAN) 12.5 MG tablet [[id number]]   atorvastatin (LIPITOR) 20 MG tablet [[id number]]   Has the patient contacted their pharmacy? Yes (Agent: If no, request that the patient contact the pharmacy for the refill. If patient does not wish to contact the pharmacy document the reason why and proceed with request.) (Agent: If yes, when and what did the pharmacy advise?)  Is this the correct pharmacy for this prescription? Yes If no, delete pharmacy and type the correct one.  This is the patient's preferred pharmacy:  [facility] [address], [address] [place] [address] Phone: [phone] Fax: [phone]   Has the prescription been filled recently? Yes  Is the patient out of the medication? Yes  Has the patient been seen for an appointment in the last year OR does the patient have an upcoming appointment? Yes  Can we respond through MyChart? Yes  Agent: Please be advised that Rx refills may take up to 3 business days. We ask that you follow-up with your pharmacy.

## 2023-12-21 ENCOUNTER — Other Ambulatory Visit: Payer: Self-pay

## 2023-12-21 ENCOUNTER — Ambulatory Visit: Payer: Self-pay

## 2023-12-21 MED ORDER — VALSARTAN 80 MG PO TABS
80.0000 mg | ORAL_TABLET | Freq: Every day | ORAL | 0 refills | Status: DC
  Filled 2023-12-21: qty 7, 7d supply, fill #0

## 2023-12-21 MED ORDER — PROPRANOLOL HCL 10 MG PO TABS
10.0000 mg | ORAL_TABLET | Freq: Two times a day (BID) | ORAL | 0 refills | Status: DC
  Filled 2023-12-21: qty 14, 7d supply, fill #0

## 2023-12-21 MED ORDER — PANTOPRAZOLE SODIUM 40 MG PO TBEC
40.0000 mg | DELAYED_RELEASE_TABLET | Freq: Two times a day (BID) | ORAL | 0 refills | Status: DC
  Filled 2023-12-21: qty 7, 4d supply, fill #0

## 2023-12-21 MED ORDER — PROMETHAZINE HCL 12.5 MG PO TABS
12.5000 mg | ORAL_TABLET | Freq: Three times a day (TID) | ORAL | 0 refills | Status: DC | PRN
  Filled 2023-12-21: qty 10, 4d supply, fill #0

## 2023-12-21 MED ORDER — ATORVASTATIN CALCIUM 20 MG PO TABS
20.0000 mg | ORAL_TABLET | Freq: Every day | ORAL | 0 refills | Status: DC
  Filled 2023-12-21: qty 7, 7d supply, fill #0

## 2023-12-21 NOTE — Telephone Encounter (Signed)
 Noted.

## 2023-12-21 NOTE — Telephone Encounter (Signed)
  Chief Complaint: nausea Symptoms: nausea and diarrhea Frequency: 10 weeks Pertinent Negatives: Patient denies no active vomiting Disposition: [] ED /[] Urgent Care (no appt availability in office) / [x] Appointment(In office/virtual)/ []  [place] [facility]/ [] Home Care/ [] Refused Recommended Disposition /[] [place] [facility]/ [x]  Follow-up with PCP Additional Notes: Patient called in stating that she has been having nausea and diarrhea for 10 weeks. States that she is almost out of phenergan and her BP medication. States vomiting 2-3 week and 2-3 diarrhea stools a day. States she is taking imodium for diarrhea. States that she missed her appt this week due to her dad having surgery. Appt scheduled for Monday  Reason for Disposition  Nausea is a chronic symptom (recurrent or ongoing AND present > 4 weeks)  Answer Assessment - Initial Assessment Questions 1. NAUSEA SEVERITY: "How bad is the nausea?" (e.g., mild, moderate, severe; dehydration, weight loss)   - MILD: loss of appetite without change in eating habits   - MODERATE: decreased oral intake without significant weight loss, dehydration, or malnutrition   - SEVERE: inadequate caloric or fluid intake, significant weight loss, symptoms of dehydration     Mod-severe 2. ONSET: "When did the nausea begin?"     10 weeks 3. VOMITING: "Any vomiting?" If Yes, ask: "How many times today?"     none 4. RECURRENT SYMPTOM: "Have you had nausea before?" If Yes, ask: "When was the last time?" "What happened that time?"     Been going on for 10 weeks 5. CAUSE: "What do you think is causing the nausea?"     unknown  Protocols used: Nausea-A-AH

## 2023-12-21 NOTE — Telephone Encounter (Signed)
 Requested medications are due for refill today.  yes  Requested medications are on the active medications list.  yes  Last refill. varied  Future visit scheduled.   yes  Notes to clinic.  Pt last seen 06/12/2023. Pt has no showed or cancelled several appts. Please review for refill.    Requested Prescriptions  Pending Prescriptions Disp Refills   atorvastatin (LIPITOR) 20 MG tablet 90 tablet 0    Sig: Take 1 tablet (20 mg total) by mouth at bedtime.     Cardiovascular:  Antilipid - Statins Failed - 12/21/2023  5:01 PM      Failed - Lipid Panel in normal range within the last 12 months    Cholesterol, Total  Date Value Ref Range Status  05/16/2021 183 100 - 199 mg/dL Final   Cholesterol  Date Value Ref Range Status  07/20/2022 199 0 - 200 mg/dL Final   LDL Chol Calc (NIH)  Date Value Ref Range Status  05/16/2021 69 0 - 99 mg/dL Final   LDL Cholesterol  Date Value Ref Range Status  07/20/2022 118 (H) 0 - 99 mg/dL Final    Comment:           Total Cholesterol/HDL:CHD Risk Coronary Heart Disease Risk Table                     Men   Women  1/2 Average Risk   3.4   3.3  Average Risk       5.0   4.4  2 X Average Risk   9.6   7.1  3 X Average Risk  23.4   11.0        Use the calculated Patient Ratio above and the CHD Risk Table to determine the patient's CHD Risk.        ATP III CLASSIFICATION (LDL):  <100     mg/dL   Optimal  161-096  mg/dL   Near or Above                    Optimal  130-159  mg/dL   Borderline  045-409  mg/dL   High  >811     mg/dL   Very High Performed at [facility], [address], [place], [place] [address]    HDL  Date Value Ref Range Status  07/20/2022 58 >40 mg/dL Final  29/56/2130 94 >86 mg/dL Final   Triglycerides  Date Value Ref Range Status  07/20/2022 117 <150 mg/dL Final         Passed - Patient is not pregnant      Passed - Valid encounter within last 12 months    Recent Outpatient Visits            6 months ago Essential hypertension   [facility] [place]. [facility] Storm Frisk, MD   11 months ago Colon cancer screening   [facility] [facility] [place] [place]. [facility] Storm Frisk, MD   1 year ago Acute cystitis without hematuria   [place] [facility] - [facility] Dept Of [place]. [facility] Marcine Matar, MD   1 year ago Prediabetes   [place] [facility] [place] - [facility] Dept Of [place]. [facility] Storm Frisk, MD   1 year ago Pap smear for cervical cancer screening   [place] [facility] - [facility] Dept Of [place]. [facility] Marcine Matar, MD               propranolol (INDERAL) 10 MG tablet 60 tablet 2    Sig: Take 1 tablet (10 mg total) by mouth 2 (two) times daily.     Cardiovascular:  Beta Blockers Failed - 12/21/2023  5:01 PM      Failed - Last BP in normal range    BP Readings from Last 1 Encounters:  11/25/23 (!) 158/120         Failed - Valid encounter within last 6 months    Recent Outpatient Visits           6 months ago Essential hypertension   [place] [facility] [place]. [facility] Storm Frisk, MD   11 months ago Colon cancer screening   [place] [facility] - [facility] Dept Of [place]. [facility] Storm Frisk, MD   1 year ago Acute cystitis without hematuria   [place] [facility] - A Dept Of [place]. [facility] Marcine Matar, MD   1 year ago Prediabetes   [place] [facility] [place] - [facility] Dept Of [place]. [facility] Storm Frisk, MD   1 year ago Pap smear for cervical cancer screening   [place] [facility] - [facility] Dept Of [place]. [facility] Marcine Matar, MD              Passed - Last Heart Rate in normal range    Pulse Readings from Last 1 Encounters:  11/25/23 91           promethazine (PHENERGAN) 12.5 MG tablet 40 tablet 0    Sig: Take 1 tablet (12.5 mg total) by mouth every 8 (eight) hours as needed for nausea or vomiting.     Not Delegated - Gastroenterology: Antiemetics Failed - 12/21/2023  5:01 PM      Failed - This refill cannot be delegated      Failed - Valid encounter within last 6 months    Recent Outpatient Visits           6 months ago Essential hypertension   [place] [facility] Dept Of [place]. [facility] Storm Frisk, MD   11 months ago Colon cancer screening   [place] [facility] [place] - [facility] Dept Of [place]. [facility] Storm Frisk, MD   1 year ago Acute cystitis without hematuria   [place] [facility] Dept Of [place]. [facility] Marcine Matar, MD   1 year ago Prediabetes   [place] [facility] Of [place]. [facility] Storm Frisk, MD   1 year ago Pap smear for cervical cancer screening   [place] [facility] - [facility] Dept Of [place]. [facility] Marcine Matar, MD               pantoprazole (PROTONIX) 40 MG tablet 60 tablet 0    Sig: Take 1 tablet (40 mg total) by mouth 2 (two) times daily before a meal.     Gastroenterology: Proton Pump Inhibitors Passed - 12/21/2023  5:01 PM      Passed - Valid encounter within last 12 months  Recent Outpatient Visits           6 months ago Essential hypertension   [place] [facility] [place] [facility] Dept Of [place]. [facility] Storm Frisk, MD   11 months ago Colon cancer screening   [place] [facility] [place] - [facility] Dept Of [place]. [facility] Storm Frisk, MD   1 year ago Acute cystitis without hematuria   [place] [facility] - [facility] Dept Of [place]. [facility] Marcine Matar, MD   1 year ago Prediabetes   [place] [facility] [place] - A Dept Of [place]. [facility]  Storm Frisk, MD   1 year ago Pap smear for cervical cancer screening   [place] [facility] - A Dept Of [place]. [facility] Jonah Blue B, MD               valsartan (DIOVAN) 80 MG tablet 90 tablet 1    Sig: Take 1 tablet (80 mg total) by mouth daily.     Cardiovascular:  Angiotensin Receptor Blockers Failed - 12/21/2023  5:01 PM      Failed - Cr in normal range and within 180 days    Creatinine, Ser  Date Value Ref Range Status  01/12/2023 0.75 0.57 - 1.00 mg/dL Final         Failed - K in normal range and within 180 days    Potassium  Date Value Ref Range Status  01/12/2023 4.6 3.5 - 5.2 mmol/L Final         Failed - Last BP in normal range    BP Readings from Last 1 Encounters:  11/25/23 (!) 158/120         Failed - Valid encounter within last 6 months    Recent Outpatient Visits           6 months ago Essential hypertension   [place] [facility] [place] [facility] Of [place]. [facility] Storm Frisk, MD   11 months ago Colon cancer screening   [place] [facility] [place] [facility] Dept Of [place]. [facility] Storm Frisk, MD   1 year ago Acute cystitis without hematuria   [place] [facility] Dept Of [place]. [facility] Marcine Matar, MD   1 year ago Prediabetes   [place] [facility] [place] - [facility] Dept Of [place]. [facility] Storm Frisk, MD   1 year ago Pap smear for cervical cancer screening    [facility] - A Dept Of . [facility] Marcine Matar, MD              Passed - Patient is not pregnant

## 2023-12-22 ENCOUNTER — Other Ambulatory Visit: Payer: Self-pay | Admitting: Critical Care Medicine

## 2023-12-24 ENCOUNTER — Other Ambulatory Visit (HOSPITAL_COMMUNITY): Payer: Self-pay | Admitting: Student

## 2023-12-24 ENCOUNTER — Other Ambulatory Visit: Payer: Self-pay

## 2023-12-24 ENCOUNTER — Encounter: Payer: Self-pay | Admitting: Family Medicine

## 2023-12-24 ENCOUNTER — Other Ambulatory Visit (HOSPITAL_COMMUNITY): Payer: Self-pay

## 2023-12-24 ENCOUNTER — Ambulatory Visit: Payer: Self-pay | Attending: Family Medicine | Admitting: Family Medicine

## 2023-12-24 VITALS — BP 120/84 | HR 85 | Temp 98.5°F | Resp 16 | Ht 63.0 in | Wt 147.0 lb

## 2023-12-24 DIAGNOSIS — R110 Nausea: Secondary | ICD-10-CM

## 2023-12-24 DIAGNOSIS — F4310 Post-traumatic stress disorder, unspecified: Secondary | ICD-10-CM

## 2023-12-24 DIAGNOSIS — F419 Anxiety disorder, unspecified: Secondary | ICD-10-CM

## 2023-12-24 DIAGNOSIS — N951 Menopausal and female climacteric states: Secondary | ICD-10-CM

## 2023-12-24 DIAGNOSIS — B351 Tinea unguium: Secondary | ICD-10-CM

## 2023-12-24 DIAGNOSIS — I10 Essential (primary) hypertension: Secondary | ICD-10-CM

## 2023-12-24 DIAGNOSIS — K529 Noninfective gastroenteritis and colitis, unspecified: Secondary | ICD-10-CM

## 2023-12-24 DIAGNOSIS — Z1231 Encounter for screening mammogram for malignant neoplasm of breast: Secondary | ICD-10-CM

## 2023-12-24 DIAGNOSIS — R197 Diarrhea, unspecified: Secondary | ICD-10-CM

## 2023-12-24 DIAGNOSIS — R7303 Prediabetes: Secondary | ICD-10-CM

## 2023-12-24 DIAGNOSIS — F332 Major depressive disorder, recurrent severe without psychotic features: Secondary | ICD-10-CM

## 2023-12-24 DIAGNOSIS — E785 Hyperlipidemia, unspecified: Secondary | ICD-10-CM

## 2023-12-24 DIAGNOSIS — E782 Mixed hyperlipidemia: Secondary | ICD-10-CM

## 2023-12-24 MED ORDER — ATORVASTATIN CALCIUM 20 MG PO TABS
20.0000 mg | ORAL_TABLET | Freq: Every day | ORAL | 1 refills | Status: DC
  Filled 2023-12-24 – 2024-01-18 (×3): qty 90, 90d supply, fill #0
  Filled 2024-04-17: qty 90, 90d supply, fill #1

## 2023-12-24 MED ORDER — PROMETHAZINE HCL 12.5 MG PO TABS
12.5000 mg | ORAL_TABLET | Freq: Three times a day (TID) | ORAL | 0 refills | Status: DC | PRN
  Filled 2023-12-24: qty 60, 20d supply, fill #0

## 2023-12-24 MED ORDER — TERBINAFINE HCL 250 MG PO TABS
250.0000 mg | ORAL_TABLET | Freq: Every day | ORAL | 2 refills | Status: DC
  Filled 2023-12-24: qty 30, 30d supply, fill #0
  Filled 2024-01-18: qty 30, 30d supply, fill #1
  Filled 2024-02-19: qty 30, 30d supply, fill #2

## 2023-12-24 MED ORDER — VALSARTAN 80 MG PO TABS
80.0000 mg | ORAL_TABLET | Freq: Every day | ORAL | 1 refills | Status: DC
  Filled 2023-12-24: qty 90, 90d supply, fill #0
  Filled 2024-03-12 – 2024-03-13 (×2): qty 90, 90d supply, fill #1

## 2023-12-24 MED ORDER — PANTOPRAZOLE SODIUM 40 MG PO TBEC
40.0000 mg | DELAYED_RELEASE_TABLET | Freq: Two times a day (BID) | ORAL | 1 refills | Status: DC
  Filled 2023-12-24 – 2023-12-27 (×2): qty 90, 45d supply, fill #0
  Filled 2024-02-09: qty 90, 45d supply, fill #1

## 2023-12-24 MED ORDER — PROPRANOLOL HCL 10 MG PO TABS
10.0000 mg | ORAL_TABLET | Freq: Two times a day (BID) | ORAL | 1 refills | Status: DC
  Filled 2023-12-24 – 2024-01-27 (×2): qty 60, 30d supply, fill #0

## 2023-12-24 NOTE — Progress Notes (Signed)
 Nausea and diarrhea x 8 weeks, since January.  If she does not take Imodium, she will have diarrhea.  Has emesis episodes occasionally.   Needs refills on  OCP Promethazine Promethazine Valsartan  Issue with propanolol refill.

## 2023-12-24 NOTE — Progress Notes (Signed)
 Subjective:  Patient ID: Margaret Mccann, female    DOB: Jul 22, 1971  Age: 53 y.o. MRN: [id number]  CC: Nausea and Diarrhea     Discussed the use of AI scribe software for clinical note transcription with the patient, who gave verbal consent to proceed.  History of Present Illness 53 year old female with a history of PTSD, major depression, severe anxiety, previous suicidal attempts, substance abuse (nicotine, cocaine, alcohol), and hypertension, hyperlipidemia, presents for a medication refill. She reports needing refills on her valsartan and atorvastatin. She also mentions that is out of propranolol which was prescribed by her pharmacist but she did receive a refill from this office for 2 weeks which canceled out the previous prescription from her psychiatrist.  She is needing me to send a message to her psychiatrist or her propranolol can be refilled.    She reports that her blood pressure has been slightly elevated recently, even while taking valsartan. She has noticed that her blood pressure has been high especially diastolic, always in the nineties and sometimes reaching a hundred.  In addition to her hypertension, she has been experiencing ongoing nausea and diarrhea for the past eight to ten weeks. She reports that she has been very sick, with the diarrhea sometimes presenting as straight water. She has been managing her symptoms with Imodium, which she takes every two to three days. If she does not take the Imodium, the diarrhea returns along with severe abdominal cramping and burning. She has not noticed any blood in her stool.  She received a letter from the city of [place] stating the water had been contaminated so she is not sure if her symptoms are related to the contaminated water.  She also reports experiencing hot flashes. She has been taking norethindrone for this symptom, she is not sexually active. She has not had a period in over a year.  Lastly, she mentions a  persistent toe fungus that over-the-counter treatments have not resolved. She inquires about the possibility of a prescription treatment for this issue.    Past Medical History:  Diagnosis Date   Alcohol withdrawal seizure with complication, with unspecified complication (HCC) 12/23/2020   Anxiety    Bipolar 1 disorder (HCC)    Depression    Intentional drug overdose (HCC) 08/04/2020   Major depressive disorder, recurrent episode with anxious distress (HCC) 08/11/2020   Morgellons syndrome    MRSA (methicillin resistant Staphylococcus aureus)    Suicide attempt (HCC)    Suicide by drug overdose (HCC) 08/11/2020   Tricuspid valve regurgitation 12/18/2020    Past Surgical History:  Procedure Laterality Date   APPENDECTOMY      Family History  Problem Relation Age of Onset   Heart attack Father 77   Stroke Father    Severe combined immunodeficiency Sister    Breast cancer Paternal Aunt    Ulcerative colitis Neg Hx    Esophageal cancer Neg Hx     Social History   Socioeconomic History   Marital status: Single    Spouse name: Not on file   Number of children: Not on file   Years of education: Not on file   Highest education level: Bachelor's degree (e.g., BA, AB, BS)  Occupational History   Not on file  Tobacco Use   Smoking status: Some Days    Current packs/day: 0.25    Average packs/day: 0.3 packs/day for 5.0 years (1.3 ttl pk-yrs)    Types: Cigarettes   Smokeless tobacco: Never  Vaping Use  Vaping status: Never Used  Substance and Sexual Activity   Alcohol use: Yes    Comment: occas   Drug use: Not Currently    Types: Cocaine    Comment: relapsed the past weekend, no cocaine use prior 6 months   Sexual activity: Not Currently    Birth control/protection: Pill  Other Topics Concern   Not on file  Social History Narrative   ** Merged History Encounter **       Social Drivers of Health   Financial Resource Strain: Medium Risk (12/21/2023)   Overall  Financial Resource Strain (CARDIA)    Difficulty of Paying Living Expenses: Somewhat hard  Food Insecurity: No Food Insecurity (12/21/2023)   Hunger Vital Sign    Worried About Running Out of Food in the Last Year: Never true    Ran Out of Food in the Last Year: Never true  Transportation Needs: No Transportation Needs (12/21/2023)   PRAPARE - Administrator, Civil Service (Medical): No    Lack of Transportation (Non-Medical): No  Physical Activity: Insufficiently Active (12/21/2023)   Exercise Vital Sign    Days of Exercise per Week: 3 days    Minutes of Exercise per Session: 30 min  Stress: Stress Concern Present (12/21/2023)   [employer] of Occupational Health - Occupational Stress Questionnaire    Feeling of Stress : Rather much  Social Connections: Socially Isolated (12/21/2023)   Social Connection and Isolation Panel [NHANES]    Frequency of Communication with Friends and Family: Twice a week    Frequency of Social Gatherings with Friends and Family: Once a week    Attends Religious Services: Never    Database administrator or Organizations: No    Attends Engineer, structural: Not on file    Marital Status: Divorced    Allergies  Allergen Reactions   Lamictal [Lamotrigine] Anaphylaxis, Rash and Other (See Comments)    Stevens-Johnson syndrome and fevers, also   Lamictal [Lamotrigine] Anaphylaxis, Rash and Other (See Comments)    Fevers and Stevens-Johnson syndrome, also   Tramadol Other (See Comments)    Pt had a seizure after taking Tramadol!!   Geodon [Ziprasidone Hcl] Rash   Levetiracetam Anxiety    Outpatient Medications Prior to Visit  Medication Sig Dispense Refill   cloNIDine (CATAPRES) 0.2 MG tablet Take 1 tablet (0.2 mg total) by mouth at bedtime. 30 tablet 2   desvenlafaxine (PRISTIQ) 25 MG 24 hr tablet Take 3 tablets (75 mg total) by mouth daily. 90 tablet 2   gabapentin (NEURONTIN) 600 MG tablet Take 1 tablet (600 mg total) by mouth  in the morning, at noon, in the evening, and at bedtime. 120 tablet 1   hydrOXYzine (ATARAX) 50 MG tablet Take 1 tablet (50 mg total) by mouth 3 (three) times daily as needed for anxiety. 90 tablet 2   mirtazapine (REMERON SOL-TAB) 45 MG disintegrating tablet Take 1 tablet (45 mg total) by mouth at bedtime. 30 tablet 2   norethindrone (INCASSIA) 0.35 MG tablet Take 1 tablet (0.35 mg total) by mouth daily. 84 tablet 0   atorvastatin (LIPITOR) 20 MG tablet Take 1 tablet (20 mg total) by mouth at bedtime. 7 tablet 0   pantoprazole (PROTONIX) 40 MG tablet Take 1 tablet (40 mg total) by mouth 2 (two) times daily before a meal. 7 tablet 0   promethazine (PHENERGAN) 12.5 MG tablet Take 1 tablet (12.5 mg total) by mouth every 8 (eight) hours as needed for nausea  or vomiting. 10 tablet 0   valsartan (DIOVAN) 80 MG tablet Take 1 tablet (80 mg total) by mouth daily. 7 tablet 0   No facility-administered medications prior to visit.     ROS Review of Systems  Constitutional:  Negative for activity change and appetite change.  HENT:  Negative for sinus pressure and sore throat.   Respiratory:  Negative for chest tightness, shortness of breath and wheezing.   Cardiovascular:  Negative for chest pain and palpitations.  Gastrointestinal:  Positive for diarrhea, nausea and vomiting. Negative for abdominal distention, abdominal pain and constipation.  Genitourinary: Negative.   Musculoskeletal: Negative.   Psychiatric/Behavioral:  Negative for behavioral problems and dysphoric mood.     Objective:  BP 120/84 (BP Location: Left Arm, Patient Position: Sitting, Cuff Size: Normal)   Pulse 85   Temp 98.5 F (36.9 C) (Oral)   Resp 16   Ht 5\' 3"  (1.6 m)   Wt 147 lb (66.7 kg)   LMP 10/27/2019   SpO2 99%   BMI 26.04 kg/m      12/24/2023   11:29 AM 12/24/2023   10:58 AM 11/25/2023    5:04 AM  BP/Weight  Systolic BP 120 134   Diastolic BP 84 91   Wt. (Lbs)  147 146.39  BMI  26.04 kg/m2 25.93 kg/m2       Physical Exam Constitutional:      Appearance: She is well-developed.  Cardiovascular:     Rate and Rhythm: Normal rate.     Heart sounds: Normal heart sounds. No murmur heard. Pulmonary:     Effort: Pulmonary effort is normal.     Breath sounds: Normal breath sounds. No wheezing or rales.  Chest:     Chest wall: No tenderness.  Abdominal:     General: Bowel sounds are normal. There is no distension.     Palpations: Abdomen is soft. There is no mass.     Tenderness: There is abdominal tenderness.  Musculoskeletal:        General: Normal range of motion.     Right lower leg: No edema.     Left lower leg: No edema.  Skin:    Comments: Thickened toenails in left foot  Neurological:     Mental Status: She is alert and oriented to person, place, and time.  Psychiatric:        Mood and Affect: Mood normal.        Latest Ref Rng & Units 01/12/2023    4:07 PM 07/17/2022    7:29 AM 07/16/2022    5:00 AM  CMP  Glucose 70 - 99 mg/dL 91  161  93   BUN 6 - 24 mg/dL 6  9  15    Creatinine 0.57 - 1.00 mg/dL 0.96  0.45  4.09   Sodium 134 - 144 mmol/L 138  137  142   Potassium 3.5 - 5.2 mmol/L 4.6  4.5  4.4   Chloride 96 - 106 mmol/L 102  108  111   CO2 20 - 29 mmol/L 20  22  20    Calcium 8.7 - 10.2 mg/dL 9.5  9.0  8.5   Total Protein 6.0 - 8.5 g/dL 6.9     Total Bilirubin 0.0 - 1.2 mg/dL <8.1     Alkaline Phos 44 - 121 IU/L 97     AST 0 - 40 IU/L 24     ALT 0 - 32 IU/L 31       Lipid Panel  Component Value Date/Time   CHOL 199 07/20/2022 0706   CHOL 183 05/16/2021 0930   TRIG 117 07/20/2022 0706   HDL 58 07/20/2022 0706   HDL 94 05/16/2021 0930   CHOLHDL 3.4 07/20/2022 0706   VLDL 23 07/20/2022 0706   LDLCALC 118 (H) 07/20/2022 0706   LDLCALC 69 05/16/2021 0930    CBC    Component Value Date/Time   WBC 7.4 01/12/2023 1607   WBC 6.2 07/17/2022 0729   RBC 4.48 01/12/2023 1607   RBC 3.90 07/17/2022 0729   HGB 13.5 01/12/2023 1607   HCT 40.1 01/12/2023  1607   PLT 287 01/12/2023 1607   MCV 90 01/12/2023 1607   MCH 30.1 01/12/2023 1607   MCH 31.0 07/17/2022 0729   MCHC 33.7 01/12/2023 1607   MCHC 33.1 07/17/2022 0729   RDW 12.3 01/12/2023 1607   LYMPHSABS 3.7 (H) 01/12/2023 1607   MONOABS 1.0 07/15/2022 2130   EOSABS 0.2 01/12/2023 1607   BASOSABS 0.0 01/12/2023 1607    Lab Results  Component Value Date   HGBA1C 6.0 (H) 01/12/2023       Assessment & Plan Diarrhea and Nausea Experiencing diarrhea and nausea for 8-10 weeks, potentially due to local water contamination. Diarrhea is controlled with loperamide but recurs with abdominal cramping and burning if not taken. Persistent nausea with vomiting 2-3 times weekly. No hematochezia. Differential includes bacterial or parasitic infection, irritable bowel syndrome, inflammatory bowel disease, celiac disease, and hepatitis A. - Order stool culture for bacterial and parasitic causes - Order tests for inflammatory bowel disease, celiac disease, and hepatitis A - Evaluate renal and hepatic function - Prescribe 30-day supply of promethazine for nausea  Hypertension On valsartan for hypertension with recent elevated home blood pressure readings, diastolic in the 90s to 100s. Elevated blood pressure noted at last psychiatric visit. Monitoring and potential medication adjustment required. - Recheck blood pressure revealed blood pressure back to normal -No regimen change indicated - Refill valsartan  Hyperlipidemia On atorvastatin for hyperlipidemia.  Cholesterol monitoring needed. - Refill atorvastatin - Monitor cholesterol levels  Hot Flashes Experiencing hot flashes, currently taking norethindrone, which is inappropriate. Smoking increases thrombotic risk with hormonal treatments.  -She in on Pristiq, Gabapentin already -Non-hormonal options like Veozah available but costly without insurance. Gynecological referral advised. - Refer to gynecologist for hot flash management - Discuss  thrombotic risks of hormonal treatments due to smoking   Onychomycosis Fungal infection on left foot unresponsive to topical treatments. Oral antifungal therapy considered, with liver function monitoring due to potential hepatotoxicity. Terbinafine prescribed with caution. - Prescribe terbinafine - Monitor hepatic function and discontinue if abnormal   Prediabetes Prediabetes with previous A1c of 6.0%. Requires A1c monitoring. Work on lifestyle to reduce progression to Diabetes   MDD/Anxiety/ PTSD Controlled -Management as per Behavioral Health -I have sent a message to her Psychiatrist regardig her Propranolol refill   General Health Maintenance -Overdue for mammogram, interested in scholarship program.  - Refer for mammogram via scholarship program   Follow-up Managed by behavioral health at [facility]. Coordination with psychiatrist Dr. Lamar Sprinkles needed for propranolol management. Lab results to be communicated via MyChart. - Contact Dr. Lamar Sprinkles regarding propranolol - Communicate lab results via MyChart      Meds ordered this encounter  Medications   atorvastatin (LIPITOR) 20 MG tablet    Sig: Take 1 tablet (20 mg total) by mouth at bedtime.    Dispense:  90 tablet    Refill:  1  pantoprazole (PROTONIX) 40 MG tablet    Sig: Take 1 tablet (40 mg total) by mouth 2 (two) times daily before a meal.    Dispense:  90 tablet    Refill:  1   promethazine (PHENERGAN) 12.5 MG tablet    Sig: Take 1 tablet (12.5 mg total) by mouth every 8 (eight) hours as needed for nausea or vomiting.    Dispense:  60 tablet    Refill:  0   valsartan (DIOVAN) 80 MG tablet    Sig: Take 1 tablet (80 mg total) by mouth daily.    Dispense:  90 tablet    Refill:  1   terbinafine (LAMISIL) 250 MG tablet    Sig: Take 1 tablet (250 mg total) by mouth daily.    Dispense:  30 tablet    Refill:  2    Follow-up: Return in about 6 months (around 06/24/2024) for  Chronic medical conditions.       Hoy Register, MD, FAAFP. [facility] [place], [place] [phone]   12/24/2023, 11:41 AM

## 2023-12-24 NOTE — Patient Instructions (Signed)
 VISIT SUMMARY:  During today's visit, we discussed your ongoing health concerns, including your hypertension, hyperlipidemia, gastrointestinal symptoms, hot flashes, and toe fungus. We also reviewed your general health maintenance needs and coordinated care with your psychiatrist.  YOUR PLAN:  -DIARRHEA AND NAUSEA: You have been experiencing diarrhea and nausea for the past 8-10 weeks, which may be due to local water contamination or other causes. We will conduct tests to check for bacterial or parasitic infections, irritable bowel syndrome, inflammatory bowel disease, celiac disease, and hepatitis A. We will also evaluate your kidney and liver function. A 30-day supply of promethazine has been prescribed to help manage your nausea.  -HYPERTENSION: Your blood pressure has been slightly elevated recently, even while taking valsartan. Hypertension means high blood pressure, which can lead to serious health issues if not managed. We will recheck your blood pressure and adjust your medication if necessary. Your valsartan prescription has been refilled.  -HYPERLIPIDEMIA: Hyperlipidemia means having high levels of fats (lipids) in your blood, which can increase your risk of heart disease. You are currently taking atorvastatin for this condition. Your prescription has been refilled, and we will continue to monitor your cholesterol levels.  -HOT FLASHES: You have been experiencing hot flashes and are currently taking norethindrone, which is not the best option for you. Hot flashes are sudden feelings of warmth, often associated with menopause. We discussed the risks of hormonal treatments, especially since you smoke, and recommended a referral to a gynecologist to explore other treatment options.  -ONYCHOMYCOSIS: Onychomycosis is a fungal infection of the toenails. Your current over-the-counter treatments have not been effective. We have prescribed terbinafine, an oral antifungal medication, but we will need  to monitor your liver function due to potential side effects.  -GENERAL HEALTH MAINTENANCE: You are overdue for a mammogram and are interested in a scholarship program for this. You also have prediabetes, with a previous A1c of 6.0%. We will monitor your A1c levels to manage your prediabetes.  INSTRUCTIONS:  Please follow up with the recommended tests for your gastrointestinal symptoms and monitor your blood pressure at home. Contact Dr. Lamar Sprinkles regarding your propranolol management. We will communicate your lab results via MyChart. Additionally, schedule a mammogram through the scholarship program and continue to monitor your A1c levels for prediabetes.

## 2023-12-26 ENCOUNTER — Ambulatory Visit: Payer: Self-pay | Admitting: Family Medicine

## 2023-12-26 ENCOUNTER — Encounter: Payer: Self-pay | Admitting: Family Medicine

## 2023-12-26 LAB — CBC WITH DIFFERENTIAL/PLATELET
Basophils Absolute: 0 10*3/uL (ref 0.0–0.2)
Basos: 1 %
EOS (ABSOLUTE): 0.1 10*3/uL (ref 0.0–0.4)
Eos: 2 %
Hematocrit: 32.7 % — ABNORMAL LOW (ref 34.0–46.6)
Hemoglobin: 11.1 g/dL (ref 11.1–15.9)
Immature Grans (Abs): 0 10*3/uL (ref 0.0–0.1)
Immature Granulocytes: 0 %
Lymphocytes Absolute: 1.8 10*3/uL (ref 0.7–3.1)
Lymphs: 28 %
MCH: 30.2 pg (ref 26.6–33.0)
MCHC: 33.9 g/dL (ref 31.5–35.7)
MCV: 89 fL (ref 79–97)
Monocytes Absolute: 0.5 10*3/uL (ref 0.1–0.9)
Monocytes: 8 %
Neutrophils Absolute: 4.1 10*3/uL (ref 1.4–7.0)
Neutrophils: 61 %
Platelets: 297 10*3/uL (ref 150–450)
RBC: 3.67 x10E6/uL — ABNORMAL LOW (ref 3.77–5.28)
RDW: 13.7 % (ref 11.7–15.4)
WBC: 6.6 10*3/uL (ref 3.4–10.8)

## 2023-12-26 LAB — CMP14+EGFR
ALT: 49 IU/L — ABNORMAL HIGH (ref 0–32)
AST: 37 IU/L (ref 0–40)
Albumin: 4.3 g/dL (ref 3.9–4.9)
Alkaline Phosphatase: 84 IU/L (ref 44–121)
BUN/Creatinine Ratio: 12 (ref 9–23)
BUN: 11 mg/dL (ref 6–24)
Bilirubin Total: 0.2 mg/dL (ref 0.0–1.2)
CO2: 16 mmol/L — ABNORMAL LOW (ref 20–29)
Calcium: 9.8 mg/dL (ref 8.7–10.2)
Chloride: 99 mmol/L (ref 96–106)
Creatinine, Ser: 0.9 mg/dL (ref 0.57–1.00)
Globulin, Total: 2.6 g/dL (ref 1.5–4.5)
Glucose: 81 mg/dL (ref 70–99)
Potassium: 4.5 mmol/L (ref 3.5–5.2)
Sodium: 134 mmol/L (ref 134–144)
Total Protein: 6.9 g/dL (ref 6.0–8.5)
eGFR: 78 mL/min/{1.73_m2} (ref >59)

## 2023-12-26 LAB — LP+NON-HDL CHOLESTEROL
Cholesterol, Total: 213 mg/dL — ABNORMAL HIGH (ref 100–199)
HDL: 86 mg/dL (ref >39)
LDL Chol Calc (NIH): 104 mg/dL — ABNORMAL HIGH (ref 0–99)
Total Non-HDL-Chol (LDL+VLDL): 127 mg/dL (ref 0–129)
Triglycerides: 134 mg/dL (ref 0–149)
VLDL Cholesterol Cal: 23 mg/dL (ref 5–40)

## 2023-12-26 LAB — HEPATITIS A ANTIBODY, IGM: Hep A IgM: NEGATIVE

## 2023-12-26 LAB — HEMOGLOBIN A1C
Est. average glucose Bld gHb Est-mCnc: 117 mg/dL
Hgb A1c MFr Bld: 5.7 % — ABNORMAL HIGH (ref 4.8–5.6)

## 2023-12-26 LAB — CELIAC DISEASE PANEL: IgA/Immunoglobulin A, Serum: 247 mg/dL (ref 87–352)

## 2023-12-26 LAB — HEPATITIS A ANTIBODY, TOTAL: hep A Total Ab: NEGATIVE

## 2023-12-26 LAB — C-REACTIVE PROTEIN: CRP: 2 mg/L (ref 0–10)

## 2023-12-26 LAB — SEDIMENTATION RATE: Sed Rate: 29 mm/h (ref 0–40)

## 2023-12-27 ENCOUNTER — Other Ambulatory Visit: Payer: Self-pay

## 2023-12-28 ENCOUNTER — Other Ambulatory Visit: Payer: Self-pay

## 2024-01-02 ENCOUNTER — Ambulatory Visit: Payer: Self-pay | Admitting: Physician Assistant

## 2024-01-09 ENCOUNTER — Other Ambulatory Visit: Payer: Self-pay

## 2024-01-10 ENCOUNTER — Other Ambulatory Visit: Payer: Self-pay

## 2024-01-17 ENCOUNTER — Other Ambulatory Visit: Payer: Self-pay | Admitting: Family Medicine

## 2024-01-17 DIAGNOSIS — R110 Nausea: Secondary | ICD-10-CM

## 2024-01-18 ENCOUNTER — Other Ambulatory Visit: Payer: Self-pay

## 2024-01-18 MED ORDER — PROMETHAZINE HCL 12.5 MG PO TABS
12.5000 mg | ORAL_TABLET | Freq: Three times a day (TID) | ORAL | 0 refills | Status: DC | PRN
  Filled 2024-01-18: qty 60, 20d supply, fill #0

## 2024-01-21 ENCOUNTER — Other Ambulatory Visit: Payer: Self-pay

## 2024-01-22 ENCOUNTER — Other Ambulatory Visit: Payer: Self-pay

## 2024-01-25 ENCOUNTER — Telehealth (HOSPITAL_COMMUNITY): Payer: Self-pay | Admitting: Student

## 2024-01-25 ENCOUNTER — Encounter (HOSPITAL_COMMUNITY): Payer: Self-pay

## 2024-01-28 ENCOUNTER — Other Ambulatory Visit: Payer: Self-pay

## 2024-02-04 ENCOUNTER — Other Ambulatory Visit (HOSPITAL_COMMUNITY): Payer: Self-pay

## 2024-02-05 ENCOUNTER — Other Ambulatory Visit: Payer: Self-pay | Admitting: Family Medicine

## 2024-02-05 ENCOUNTER — Other Ambulatory Visit: Payer: Self-pay

## 2024-02-05 ENCOUNTER — Other Ambulatory Visit (HOSPITAL_COMMUNITY): Payer: Self-pay | Admitting: Student

## 2024-02-05 ENCOUNTER — Telehealth (HOSPITAL_COMMUNITY): Payer: Self-pay

## 2024-02-05 DIAGNOSIS — I10 Essential (primary) hypertension: Secondary | ICD-10-CM

## 2024-02-05 DIAGNOSIS — F3341 Major depressive disorder, recurrent, in partial remission: Secondary | ICD-10-CM

## 2024-02-05 DIAGNOSIS — F411 Generalized anxiety disorder: Secondary | ICD-10-CM

## 2024-02-05 DIAGNOSIS — R110 Nausea: Secondary | ICD-10-CM

## 2024-02-05 MED ORDER — PROPRANOLOL HCL 10 MG PO TABS
10.0000 mg | ORAL_TABLET | Freq: Two times a day (BID) | ORAL | 1 refills | Status: DC
  Filled 2024-02-05 – 2024-02-24 (×2): qty 60, 30d supply, fill #0

## 2024-02-05 MED ORDER — HYDROXYZINE HCL 50 MG PO TABS
50.0000 mg | ORAL_TABLET | Freq: Three times a day (TID) | ORAL | 2 refills | Status: DC | PRN
Start: 2024-02-05 — End: 2024-04-14
  Filled 2024-02-05: qty 90, 30d supply, fill #0
  Filled 2024-02-26 (×2): qty 90, 30d supply, fill #1
  Filled 2024-02-29 (×2): qty 90, 30d supply, fill #0
  Filled 2024-03-25 (×2): qty 90, 30d supply, fill #1

## 2024-02-05 MED ORDER — DESVENLAFAXINE SUCCINATE ER 25 MG PO TB24
75.0000 mg | ORAL_TABLET | Freq: Every day | ORAL | 2 refills | Status: DC
  Filled 2024-02-05 – 2024-02-19 (×2): qty 90, 30d supply, fill #0
  Filled 2024-03-18 (×2): qty 90, 30d supply, fill #1

## 2024-02-05 MED ORDER — GABAPENTIN 600 MG PO TABS
600.0000 mg | ORAL_TABLET | Freq: Four times a day (QID) | ORAL | 1 refills | Status: DC
  Filled 2024-02-05 – 2024-02-07 (×2): qty 120, 30d supply, fill #0
  Filled 2024-02-27: qty 120, 30d supply, fill #1

## 2024-02-05 MED ORDER — MIRTAZAPINE 45 MG PO TBDP
45.0000 mg | ORAL_TABLET | Freq: Every day | ORAL | 2 refills | Status: DC
  Filled 2024-02-05: qty 30, 30d supply, fill #0
  Filled 2024-02-26: qty 30, 30d supply, fill #1
  Filled 2024-02-29 (×3): qty 30, 30d supply, fill #0
  Filled 2024-03-25 (×2): qty 30, 30d supply, fill #1

## 2024-02-05 MED ORDER — CLONIDINE HCL 0.2 MG PO TABS
0.2000 mg | ORAL_TABLET | Freq: Every day | ORAL | 2 refills | Status: DC
  Filled 2024-02-05 – 2024-02-19 (×4): qty 30, 30d supply, fill #0
  Filled 2024-03-25: qty 30, 30d supply, fill #1
  Filled 2024-03-25: qty 30, 30d supply, fill #0

## 2024-02-05 NOTE — Telephone Encounter (Signed)
 Hello,   Pt is requesting for a refill of gabapentin  states her app is far out and is in need of a refill to be sent in.   JNL, CMA

## 2024-02-05 NOTE — Telephone Encounter (Signed)
 Done

## 2024-02-06 MED ORDER — PROMETHAZINE HCL 12.5 MG PO TABS
12.5000 mg | ORAL_TABLET | Freq: Three times a day (TID) | ORAL | 0 refills | Status: DC | PRN
  Filled 2024-02-06 – 2024-02-07 (×2): qty 60, 20d supply, fill #0

## 2024-02-06 NOTE — Telephone Encounter (Signed)
 Requested medication (s) are due for refill today - provider review   Requested medication (s) are on the active medication list -yes  Future visit scheduled -yes  Last refill: 01/18/24 #60  Notes to clinic: non delegated Rx  Requested Prescriptions  Pending Prescriptions Disp Refills   promethazine  (PHENERGAN ) 12.5 MG tablet 60 tablet 0    Sig: Take 1 tablet (12.5 mg total) by mouth every 8 (eight) hours as needed for nausea or vomiting.     Not Delegated - Gastroenterology: Antiemetics Failed - 02/06/2024  9:21 AM      Failed - This refill cannot be delegated      Failed - Valid encounter within last 6 months    Recent Outpatient Visits           1 month ago Essential hypertension   [place] [facility] [place] [facility] [place]. [facility] Joaquin Mulberry, MD   7 months ago Essential hypertension   [place] [facility] [place] - A Dept Of [place]. [facility] Vernell Goldsmith, MD   1 year ago Colon cancer screening   [place] [facility] - A Dept Of [place]. [facility] Vernell Goldsmith, MD   1 year ago Acute cystitis without hematuria   [place] [facility] - A Dept Of [place] . [facility] Lawrance Presume, MD   1 year ago Prediabetes   [place] [facility] [place] [facility] [place] [facility] Dept Of [place]. [facility] Vernell Goldsmith, MD                 Requested Prescriptions  Pending Prescriptions Disp Refills   promethazine  (PHENERGAN ) 12.5 MG tablet 60 tablet 0    Sig: Take 1 tablet (12.5 mg total) by mouth every 8 (eight) hours as needed for nausea or vomiting.     Not Delegated - Gastroenterology: Antiemetics Failed - 02/06/2024  9:21 AM      Failed - This refill cannot be delegated      Failed - Valid encounter within last 6 months    Recent Outpatient Visits           1 month ago Essential hypertension   [place] [facility] [place] [facility] Dept [facility] [place]. [facility]  Joaquin Mulberry, MD   7 months ago Essential hypertension   [employer] [facility] [place] - A Dept Of [place]. [facility] Vernell Goldsmith, MD   1 year ago Colon cancer screening   [place] [facility] - [facility] Dept Of [place]. [facility] Vernell Goldsmith, MD   1 year ago Acute cystitis without hematuria   [place] [facility] - A Dept Of [place]. [facility] Lawrance Presume, MD   1 year ago Prediabetes   [place] [facility] [place] - A Dept Of [place]. [facility] Vernell Goldsmith, MD

## 2024-02-07 ENCOUNTER — Other Ambulatory Visit: Payer: Self-pay

## 2024-02-08 ENCOUNTER — Other Ambulatory Visit: Payer: Self-pay

## 2024-02-11 ENCOUNTER — Other Ambulatory Visit: Payer: Self-pay

## 2024-02-19 ENCOUNTER — Other Ambulatory Visit: Payer: Self-pay

## 2024-02-19 ENCOUNTER — Other Ambulatory Visit (HOSPITAL_BASED_OUTPATIENT_CLINIC_OR_DEPARTMENT_OTHER): Payer: Self-pay

## 2024-02-24 ENCOUNTER — Other Ambulatory Visit: Payer: Self-pay | Admitting: Family Medicine

## 2024-02-24 DIAGNOSIS — R110 Nausea: Secondary | ICD-10-CM

## 2024-02-25 ENCOUNTER — Other Ambulatory Visit: Payer: Self-pay

## 2024-02-25 NOTE — Telephone Encounter (Signed)
 Requested medications are due for refill today.  yes  Requested medications are on the active medications list.  yes  Last refill. 02/06/2024 #60 0 rf  Future visit scheduled.   yes  Notes to clinic.  Refill not delegated.    Requested Prescriptions  Pending Prescriptions Disp Refills   promethazine  (PHENERGAN ) 12.5 MG tablet 60 tablet 0    Sig: Take 1 tablet (12.5 mg total) by mouth every 8 (eight) hours as needed for nausea or vomiting.     Not Delegated - Gastroenterology: Antiemetics Failed - 02/25/2024  5:46 PM      Failed - This refill cannot be delegated      Failed - Valid encounter within last 6 months    Recent Outpatient Visits           2 months ago Essential hypertension   [place] [facility] [place] [facility] Dept Of [place]. [facility] Joaquin Mulberry, MD   8 months ago Essential hypertension   [place] [facility] [place] - [facility] Dept [place]. [facility] Vernell Goldsmith, MD   1 year ago Colon cancer screening   [place] [facility] - [facility] Dept Of [place]. [facility] Vernell Goldsmith, MD   1 year ago Acute cystitis without hematuria   [place] [facility] - A Dept Of [place]. [facility] Lawrance Presume, MD   1 year ago Prediabetes   [place] [facility] [place] - A Dept Of [place]. [facility] Vernell Goldsmith, MD

## 2024-02-26 ENCOUNTER — Other Ambulatory Visit (HOSPITAL_COMMUNITY): Payer: Self-pay

## 2024-02-26 ENCOUNTER — Other Ambulatory Visit: Payer: Self-pay

## 2024-02-26 ENCOUNTER — Telehealth (HOSPITAL_COMMUNITY): Payer: Self-pay | Admitting: *Deleted

## 2024-02-26 MED ORDER — PROMETHAZINE HCL 12.5 MG PO TABS
12.5000 mg | ORAL_TABLET | Freq: Three times a day (TID) | ORAL | 0 refills | Status: DC | PRN
  Filled 2024-02-26: qty 60, 20d supply, fill #0

## 2024-02-26 NOTE — Telephone Encounter (Signed)
 Patient called asking for her MD to call her pharmacy to allow an early refill of her Gabapentin , Vistaril , and Remeron . She is going on a 2 week vacation and leaves this weekend. Asking that it be called in soon as possible so she can pick it up on Friday before she leaves.

## 2024-02-27 ENCOUNTER — Other Ambulatory Visit (HOSPITAL_COMMUNITY): Payer: Self-pay | Admitting: Student

## 2024-02-27 ENCOUNTER — Other Ambulatory Visit: Payer: Self-pay

## 2024-02-27 NOTE — Telephone Encounter (Signed)
 This is done.  Spoke to pharmacist at [facility], and patient will be allowed early fill, which is approximately 1 week ahead of her refill date (6/14).  Her next fill will be delayed by 1 week, which would put her back on track for the due date of next prescription.  Dr. Lorna Rose

## 2024-02-28 ENCOUNTER — Other Ambulatory Visit: Payer: Self-pay

## 2024-02-28 ENCOUNTER — Telehealth (HOSPITAL_COMMUNITY): Admitting: Student

## 2024-02-28 DIAGNOSIS — F3341 Major depressive disorder, recurrent, in partial remission: Secondary | ICD-10-CM | POA: Diagnosis not present

## 2024-02-28 DIAGNOSIS — F1491 Cocaine use, unspecified, in remission: Secondary | ICD-10-CM

## 2024-02-28 DIAGNOSIS — F411 Generalized anxiety disorder: Secondary | ICD-10-CM

## 2024-02-28 DIAGNOSIS — F1091 Alcohol use, unspecified, in remission: Secondary | ICD-10-CM

## 2024-02-28 DIAGNOSIS — I10 Essential (primary) hypertension: Secondary | ICD-10-CM

## 2024-02-28 DIAGNOSIS — F4310 Post-traumatic stress disorder, unspecified: Secondary | ICD-10-CM

## 2024-02-28 MED ORDER — PROPRANOLOL HCL 10 MG PO TABS
10.0000 mg | ORAL_TABLET | Freq: Three times a day (TID) | ORAL | 1 refills | Status: DC | PRN
  Filled 2024-02-28 – 2024-03-25 (×2): qty 90, 30d supply, fill #0

## 2024-02-28 NOTE — Progress Notes (Signed)
 Televisit via video: I connected with patient on 02/28/24 at  1:00 PM EDT by a video enabled telemedicine application and verified that I am speaking with the correct person using two identifiers.  Location: Patient: Home Provider: Office   I discussed the limitations of evaluation and management by telemedicine and the availability of in person appointments. The patient expressed understanding and agreed to proceed.  I discussed the assessment and treatment plan with the patient. The patient was provided an opportunity to ask questions and all were answered. The patient agreed with the plan and demonstrated an understanding of the instructions.   The patient was advised to call back or seek an in-person evaluation if the symptoms worsen or if the condition fails to improve as anticipated.  I spent 20 minutes in direct patient care.   BH MD/PA/NP OP Progress Note    02/28/2024 1:01 PM  Margaret Mccann  MRN:  [id number]  Chief Complaint: MM follow-up    HPI: 53 year old female seen today for follow-up psychiatric evaluation.  She has a psychiatric history of depression, PTSD, GAD, substance use (tobacco, cocaine, alcohol ).  Currently she is managed on gabapentin  600 mg three times daily, Remeron  45 mg nightly, Pristiq  75 mg daily,  hydroxyzine  50 mg 3 times daily, propanolol 10 mg twice daily and clonidine  0.2 mg nightly. She notes her medications a somewhat effective in managing her psychiatric conditions.  Patient reports that her anxiety has been increased since her last visit due to her stepfather's deteriorating health. She is caring for herself by watching her diet, maintaining good night's sleep. She is still using her coping techniques. She does not have a therapist. She has not yet asked about caregiver support resources. More frequent nighttime awakenings. Taking Sleepy-time tea, which is some helpful.   Taking hydroxyzine  TID, 100 mg at bedtime, and 50 mg PRN for  anxiety around 1-2 PM.  It does help to wind down at bedtime, but variable efficacy. She is medication compliant with other scheduled medications. Taking propranolol  BID.  BP has normalized on home checks. Averaging 100-110 (maximum systolic)/80 or 90 (maximum diastolic). She has become more lightheaded and dizzy.   Denies cravings and continues to maintain her sobriety. Sponsor also notices a difference. Meetings 3x/week and weekly meetings with sponsor.   Denies tobacco- 0.5 ppd cigarettes. Denies vaping. Alcohol : Last time 7-8 months ago  Illicit: Denies current cocaine use (last time was 3-4 years ago). Denies current illicit substance use.   Today she denies SI/HI/AVH, mania, paranoia.     Visit Diagnosis:  No diagnosis found.      Past Psychiatric History:  MDD , anxiety, PTSD, Substance abuse (cocaine, alcohol )  Past Medical History:  Past Medical History:  Diagnosis Date   Alcohol  withdrawal seizure with complication, with unspecified complication (HCC) 12/23/2020   Anxiety    Bipolar 1 disorder (HCC)    Depression    Intentional drug overdose (HCC) 08/04/2020   Major depressive disorder, recurrent episode with anxious distress (HCC) 08/11/2020   Morgellons syndrome    MRSA (methicillin resistant Staphylococcus aureus)    Suicide attempt (HCC)    Suicide by drug overdose (HCC) 08/11/2020   Tricuspid valve regurgitation 12/18/2020    Past Surgical History:  Procedure Laterality Date   APPENDECTOMY      Family Psychiatric History: Sister - committed suicide in 2005 by overdosing.  Family History:  Family History  Problem Relation Age of Onset   Heart attack Father 63   Stroke Father  Severe combined immunodeficiency Sister    Breast cancer Paternal Aunt    Ulcerative colitis Neg Hx    Esophageal cancer Neg Hx     Social History:  Social History   Socioeconomic History   Marital status: Single    Spouse name: Not on file   Number of children: Not on  file   Years of education: Not on file   Highest education level: Bachelor's degree (e.g., BA, AB, BS)  Occupational History   Not on file  Tobacco Use   Smoking status: Some Days    Current packs/day: 0.25    Average packs/day: 0.3 packs/day for 5.0 years (1.3 ttl pk-yrs)    Types: Cigarettes   Smokeless tobacco: Never  Vaping Use   Vaping status: Never Used  Substance and Sexual Activity   Alcohol  use: Yes    Comment: occas   Drug use: Not Currently    Types: Cocaine    Comment: relapsed the past weekend, no cocaine use prior 6 months   Sexual activity: Not Currently    Birth control/protection: Pill  Other Topics Concern   Not on file  Social History Narrative   ** Merged History Encounter **       Social Drivers of Health   Financial Resource Strain: Medium Risk (12/21/2023)   Overall Financial Resource Strain (CARDIA)    Difficulty of Paying Living Expenses: Somewhat hard  Food Insecurity: No Food Insecurity (12/21/2023)   Hunger Vital Sign    Worried About Running Out of Food in the Last Year: Never true    Ran Out of Food in the Last Year: Never true  Transportation Needs: No Transportation Needs (12/21/2023)   PRAPARE - Administrator, Civil Service (Medical): No    Lack of Transportation (Non-Medical): No  Physical Activity: Insufficiently Active (12/21/2023)   Exercise Vital Sign    Days of Exercise per Week: 3 days    Minutes of Exercise per Session: 30 min  Stress: Stress Concern Present (12/21/2023)   [employer] of Occupational Health - Occupational Stress Questionnaire    Feeling of Stress : Rather much  Social Connections: Socially Isolated (12/21/2023)   Social Connection and Isolation Panel [NHANES]    Frequency of Communication with Friends and Family: Twice a week    Frequency of Social Gatherings with Friends and Family: Once a week    Attends Religious Services: Never    Database administrator or Organizations: No    Attends Probation officer: Not on file    Marital Status: Divorced    Allergies:  Allergies  Allergen Reactions   Lamictal [Lamotrigine] Anaphylaxis, Rash and Other (See Comments)    Stevens-Johnson syndrome and fevers, also   Lamictal [Lamotrigine] Anaphylaxis, Rash and Other (See Comments)    Fevers and Stevens-Johnson syndrome, also   Tramadol  Other (See Comments)    Pt had a seizure after taking Tramadol !!   Geodon  [Ziprasidone  Hcl] Rash   Levetiracetam Anxiety    Metabolic Disorder Labs: Lab Results  Component Value Date   HGBA1C 5.7 (H) 12/24/2023   MPG 116.89 07/20/2022   MPG 114.02 12/19/2020   No results found for: "PROLACTIN" Lab Results  Component Value Date   CHOL 213 (H) 12/24/2023   TRIG 134 12/24/2023   HDL 86 12/24/2023   CHOLHDL 3.4 07/20/2022   VLDL 23 07/20/2022   LDLCALC 104 (H) 12/24/2023   LDLCALC 118 (H) 07/20/2022   Lab Results  Component Value Date  TSH 3.544 07/20/2022   TSH 1.300 05/16/2021    Therapeutic Level Labs: No results found for: "LITHIUM" No results found for: "VALPROATE" No results found for: "CBMZ"  Current Medications: Current Outpatient Medications  Medication Sig Dispense Refill   atorvastatin  (LIPITOR) 20 MG tablet Take 1 tablet (20 mg total) by mouth at bedtime. 90 tablet 1   cloNIDine  (CATAPRES ) 0.2 MG tablet Take 1 tablet (0.2 mg total) by mouth at bedtime. 30 tablet 2   desvenlafaxine  (PRISTIQ ) 25 MG 24 hr tablet Take 3 tablets (75 mg total) by mouth daily. 90 tablet 2   gabapentin  (NEURONTIN ) 600 MG tablet Take 1 tablet (600 mg total) by mouth in the morning, at noon, in the evening, and at bedtime. 120 tablet 1   hydrOXYzine  (ATARAX ) 50 MG tablet Take 1 tablet (50 mg total) by mouth 3 (three) times daily as needed for anxiety. 90 tablet 2   mirtazapine  (REMERON  SOL-TAB) 45 MG disintegrating tablet Take 1 tablet (45 mg total) by mouth at bedtime. 30 tablet 2   norethindrone  (INCASSIA ) 0.35 MG tablet Take 1 tablet  (0.35 mg total) by mouth daily. 84 tablet 0   pantoprazole  (PROTONIX ) 40 MG tablet Take 1 tablet (40 mg total) by mouth 2 (two) times daily before a meal. 90 tablet 1   promethazine  (PHENERGAN ) 12.5 MG tablet Take 1 tablet (12.5 mg total) by mouth every 8 (eight) hours as needed for nausea or vomiting. 60 tablet 0   propranolol  (INDERAL ) 10 MG tablet Take 1 tablet (10 mg total) by mouth 2 (two) times daily. 60 tablet 1   terbinafine  (LAMISIL ) 250 MG tablet Take 1 tablet (250 mg total) by mouth daily. 30 tablet 2   valsartan  (DIOVAN ) 80 MG tablet Take 1 tablet (80 mg total) by mouth daily. 90 tablet 1   No current facility-administered medications for this visit.     Musculoskeletal: Strength & Muscle Tone: within normal limits; limited by video visit Gait & Station: Unable to assess on video visit Patient leans: N/A  Psychiatric Specialty Exam: Review of Systems  Last menstrual period 10/27/2019.There is no height or weight on file to calculate BMI.  General Appearance: Fairly Groomed  Eye Contact:  Good  Speech:  Clear and Coherent and Normal Rate  Volume:  Normal  Mood:  Euthymic  Affect:  Appropriate and Congruent  Thought Process:  Coherent, Goal Directed, and Linear  Orientation:  Full (Time, Place, and Person)  Thought Content: WDL and Logical   Suicidal Thoughts:  No  Homicidal Thoughts:  No  Memory:  Immediate;   Good Recent;   Good Remote;   Good  Judgement:  Good  Insight:  Good  Psychomotor Activity:  Normal  Concentration:  Concentration: Good and Attention Span: Good  Recall:  Good  Fund of Knowledge: Good  Language: Good  Akathisia:  No  Handed:  Right  AIMS (if indicated): not done  Assets:  Communication Skills Desire for Improvement Financial Resources/Insurance Housing  ADL's:  Intact  Cognition: WNL  Sleep:  Good   Screenings: AIMS    Flowsheet Row [facility]  AIMS  Total Score 0      AUDIT    Flowsheet Row Ad[facility]) [facility] Ad[facility] (Discharged) [facility]  Alcohol  Use Disorder Identification Test Final Score (  AUDIT) 6 11 0      GAD-7    Flowsheet Row [facility] [place]. [facility] Office Visit from 06/12/2023 in [facility] [place] - A Dept Of [place]. [facility] Clinical Support from 06/04/2023 in [facility] Office Visit from 01/09/2023 in [facility] [place] - A Dept Of [place]. [facility] Video Visit from 07[facility]31/2022 in [facility]  Total GAD-7 Score 5 3 12 11 9       PHQ2-9    Flowsheet Row Office Visit from 12/24/2023 in [facility] [place] - A Dept Of [place]. [facility] Office Visit from 06/12/2023 in [facility] [place] - A Dept Of [place]. [facility] Clinical Support from 06/04/2023 in [facility] Office Visit from 01/09/2023 in [facility] [place] - A Dept Of [place]. [facility] Video Visit from 07/31/2022 in [facility]  PHQ-2 Total Score 0 0 0 0 1  PHQ-9 Total Score 6 -- 10 -- 6      Flowsheet Row ED from 11/25/2023 in [facility]mission (Discharged[facility] ED from 01[facility]12/2022 in [facility] at [facility]  C-SSRS RISK CATEGORY No Risk No Risk No Risk        Assessment and Plan: Patient reports acute worsening of her anxiety as she and family are dealing with the resurgence of cancer and her  stepfather.  She states that her anxiety is uncontrolled and requests an increase in antianxiolytic coverage.  We discussed benzodiazepines, but she is advised that since she is not yet with 1 year of sobriety, she is still in a very vulnerable state, and her sobriety could be affected.  Additionally, if BZD were to be prescribed, it would be a very limited course, and there is no certainty as to how long she is expected to be dealing with grief and distress, so we would need a more sustainable option instead.  She voices understanding and agreement.  Today, we will adjust her propranolol .  Rather than taking her propranolol  scheduled, she will take at the onset of anxiety symptoms.  Additionally, she is able to take a third dosage as needed.  She is agreeable with this change.  Future direction may be to increase her Pristiq .  Her blood pressure has been in the lower limit of normal on home monitoring.  She has been asymptomatic.  As we increase her propranolol , she is advised to assess whether she develops symptoms of hypotension and given precautions to hold additional dose.  Patient poses no safety concerns toward herself nor others at this time.   1. GAD (generalized anxiety disorder)  - Continue mirtazapine  45 MG at bedtime. - Continue- hydrOXYzine  50 MG 3 (three) times daily as needed for anxiety (patient taking 2 times daily on average) - Continue- desvenlafaxine  (PRISTIQ ) 75 mg daily.  - Continue gabapentin  600 mg QID - Increase to propranolol  10 MG tablets three times daily as needed for anxiety.  Take at the onset of anxiety symptoms rather than scheduled.  2. Major depressive disorder, recurrent episode, in partial remission with anxious distress (HCC)  - Continue mirtazapine  45 MG at bedtime. - Continue- hydrOXYzine  50 MG 3 (three) times daily  as needed for anxiety.   - Continue- desvenlafaxine  (PRISTIQ ) 75 mg daily.   3. Primary hypertension  Continue cloNIDine  0.2 MG at bedtime.   Increase to propranolol  10 MG tablets three times daily as needed for anxiety.  Take at the onset of anxiety symptoms rather than scheduled. -Advised PCP follow-up   Follow up in approximately 8 weeks with Dr. Camillo Celestine.  Patient advised that this writer will be leaving the practice in late June.  Shery Done, MD 02/28/2024 1:01 PM

## 2024-02-29 ENCOUNTER — Other Ambulatory Visit (HOSPITAL_COMMUNITY): Payer: Self-pay

## 2024-02-29 ENCOUNTER — Other Ambulatory Visit (HOSPITAL_BASED_OUTPATIENT_CLINIC_OR_DEPARTMENT_OTHER): Payer: Self-pay

## 2024-02-29 ENCOUNTER — Other Ambulatory Visit: Payer: Self-pay

## 2024-03-01 ENCOUNTER — Encounter (HOSPITAL_COMMUNITY): Payer: Self-pay | Admitting: Student

## 2024-03-10 ENCOUNTER — Emergency Department (HOSPITAL_BASED_OUTPATIENT_CLINIC_OR_DEPARTMENT_OTHER): Payer: Self-pay

## 2024-03-10 ENCOUNTER — Emergency Department (HOSPITAL_BASED_OUTPATIENT_CLINIC_OR_DEPARTMENT_OTHER)
Admission: EM | Admit: 2024-03-10 | Discharge: 2024-03-11 | Disposition: A | Discharge status: Home / Self Care | Payer: Self-pay | Attending: Emergency Medicine | Admitting: Emergency Medicine

## 2024-03-10 ENCOUNTER — Other Ambulatory Visit: Payer: Self-pay

## 2024-03-10 ENCOUNTER — Encounter (HOSPITAL_BASED_OUTPATIENT_CLINIC_OR_DEPARTMENT_OTHER): Payer: Self-pay | Admitting: Emergency Medicine

## 2024-03-10 DIAGNOSIS — M542 Cervicalgia: Secondary | ICD-10-CM | POA: Insufficient documentation

## 2024-03-10 DIAGNOSIS — R6884 Jaw pain: Secondary | ICD-10-CM | POA: Insufficient documentation

## 2024-03-10 DIAGNOSIS — R0602 Shortness of breath: Secondary | ICD-10-CM | POA: Insufficient documentation

## 2024-03-10 DIAGNOSIS — R0789 Other chest pain: Secondary | ICD-10-CM | POA: Insufficient documentation

## 2024-03-10 DIAGNOSIS — R109 Unspecified abdominal pain: Secondary | ICD-10-CM | POA: Insufficient documentation

## 2024-03-10 DIAGNOSIS — Z79899 Other long term (current) drug therapy: Secondary | ICD-10-CM | POA: Insufficient documentation

## 2024-03-10 DIAGNOSIS — I10 Essential (primary) hypertension: Secondary | ICD-10-CM | POA: Insufficient documentation

## 2024-03-10 DIAGNOSIS — L03221 Cellulitis of neck: Secondary | ICD-10-CM

## 2024-03-10 DIAGNOSIS — K0889 Other specified disorders of teeth and supporting structures: Secondary | ICD-10-CM | POA: Insufficient documentation

## 2024-03-10 LAB — URINALYSIS, ROUTINE W REFLEX MICROSCOPIC
Bilirubin Urine: NEGATIVE
Glucose, UA: NEGATIVE mg/dL
Hgb urine dipstick: NEGATIVE
Ketones, ur: NEGATIVE mg/dL
Leukocytes,Ua: NEGATIVE
Nitrite: NEGATIVE
Protein, ur: NEGATIVE mg/dL
Specific Gravity, Urine: 1.01 (ref 1.005–1.030)
pH: 6 (ref 5.0–8.0)

## 2024-03-10 LAB — BASIC METABOLIC PANEL WITH GFR
Anion gap: 17 — ABNORMAL HIGH (ref 5–15)
BUN: 10 mg/dL (ref 6–20)
CO2: 21 mmol/L — ABNORMAL LOW (ref 22–32)
Calcium: 10.1 mg/dL (ref 8.9–10.3)
Chloride: 91 mmol/L — ABNORMAL LOW (ref 98–111)
Creatinine, Ser: 0.9 mg/dL (ref 0.44–1.00)
GFR, Estimated: >60 mL/min (ref >60)
Glucose, Bld: 103 mg/dL — ABNORMAL HIGH (ref 70–99)
Potassium: 4.8 mmol/L (ref 3.5–5.1)
Sodium: 129 mmol/L — ABNORMAL LOW (ref 135–145)

## 2024-03-10 LAB — CBC
HCT: 34.4 % — ABNORMAL LOW (ref 36.0–46.0)
Hemoglobin: 11.7 g/dL — ABNORMAL LOW (ref 12.0–15.0)
MCH: 30.1 pg (ref 26.0–34.0)
MCHC: 34 g/dL (ref 30.0–36.0)
MCV: 88.4 fL (ref 80.0–100.0)
Platelets: 323 10*3/uL (ref 150–400)
RBC: 3.89 MIL/uL (ref 3.87–5.11)
RDW: 12.9 % (ref 11.5–15.5)
WBC: 10.1 10*3/uL (ref 4.0–10.5)
nRBC: 0 % (ref 0.0–0.2)

## 2024-03-10 LAB — TROPONIN T, HIGH SENSITIVITY: Troponin T High Sensitivity: <15 ng/L (ref <19)

## 2024-03-10 LAB — PREGNANCY, URINE: Preg Test, Ur: NEGATIVE

## 2024-03-10 MED ORDER — ONDANSETRON HCL 4 MG/2ML IJ SOLN
4.0000 mg | Freq: Once | INTRAMUSCULAR | Status: AC
  Administered 2024-03-11: 4 mg via INTRAVENOUS
  Filled 2024-03-10: qty 2

## 2024-03-10 MED ORDER — CLINDAMYCIN PHOSPHATE 600 MG/50ML IV SOLN
600.0000 mg | Freq: Once | INTRAVENOUS | Status: AC
  Administered 2024-03-11: 600 mg via INTRAVENOUS
  Filled 2024-03-10: qty 50

## 2024-03-10 MED ORDER — MORPHINE SULFATE (PF) 4 MG/ML IV SOLN
4.0000 mg | Freq: Once | INTRAVENOUS | Status: AC
  Administered 2024-03-11: 4 mg via INTRAVENOUS
  Filled 2024-03-10: qty 1

## 2024-03-10 MED ORDER — SODIUM CHLORIDE 0.9 % IV BOLUS
1000.0000 mL | Freq: Once | INTRAVENOUS | Status: AC
Start: 2024-03-10 — End: 2024-03-11
  Administered 2024-03-11: 1000 mL via INTRAVENOUS

## 2024-03-10 NOTE — ED Triage Notes (Signed)
 PtPOV slow gait- c/o toothache to L lower jaw x1 week. Reports new L neck swelling since yesterday.    Pt reports difficulty swallowing due to swelling, shob and chest pain x 2 hours.    Also reports diarrhea since February. Reports difficulty urinating its hard to come out x 1 week   Took advil  and tylenol  2-3 hours ago.   Pt speaking in full sentences, tachypneic.  Appears anxious and restless. Pt guided to take slow steady breaths, pt acknowledges feeling anxious, able to slow respirations down with guidance.

## 2024-03-10 NOTE — ED Provider Notes (Signed)
 [place] [facility] Provider Note   CSN: [id number] Arrival date & time: 03/10/24  2026     Patient presents with: Dental Pain, Chest Pain, and Shortness of Breath   Margaret Mccann is a 53 y.o. female.  {Add pertinent medical, surgical, social history, OB history to HPI:32947} Patient is a 53 year old female with past medical history of hypertension, bipolar disorder, anxiety.  Patient presenting today with multiple complaints.  She describes a painful, swollen area to her left jaw just below her mandible.  This has been hurting for the past 2 days, then swelled up today.  She describes difficulty chewing and swallowing due to the pain.  She does report having a decayed and broken off molar, but denies any intraoral swelling.  She also describes a several month history of persistent diarrhea.  She has been seen by her primary doctor and has had workup performed into this.  She tells me she tested negative for celiac disease and no definitive cause was found.  Patient also states that she started with chest discomfort this evening just prior to coming here.  She denies nausea or diaphoresis, but does feel somewhat short of breath.  She has no prior cardiac history and in 2023 had a calcium  score of 0 on her chest CT.       Prior to Admission medications   Medication Sig Start Date End Date Taking? Authorizing Provider  atorvastatin  (LIPITOR) 20 MG tablet Take 1 tablet (20 mg total) by mouth at bedtime. 12/24/23   Newlin, Enobong, MD  cloNIDine  (CATAPRES ) 0.2 MG tablet Take 1 tablet (0.2 mg total) by mouth at bedtime. 02/05/24   Shery Done, MD  desvenlafaxine  (PRISTIQ ) 25 MG 24 hr tablet Take 3 tablets (75 mg total) by mouth daily. 02/05/24   Shery Done, MD  gabapentin  (NEURONTIN ) 600 MG tablet Take 1 tablet (600 mg total) by mouth in the morning, at noon, in the evening, and at bedtime. 02/05/24   Shery Done, MD  hydrOXYzine   (ATARAX ) 50 MG tablet Take 1 tablet (50 mg total) by mouth 3 (three) times daily as needed for anxiety. 02/05/24   Shery Done, MD  mirtazapine  (REMERON  SOL-TAB) 45 MG disintegrating tablet Take 1 tablet (45 mg total) by mouth at bedtime. 02/05/24   Shery Done, MD  norethindrone  (INCASSIA ) 0.35 MG tablet Take 1 tablet (0.35 mg total) by mouth daily. 09/26/23   Vernell Goldsmith, MD  pantoprazole  (PROTONIX ) 40 MG tablet Take 1 tablet (40 mg total) by mouth 2 (two) times daily before a meal. 12/24/23   Joaquin Mulberry, MD  promethazine  (PHENERGAN ) 12.5 MG tablet Take 1 tablet (12.5 mg total) by mouth every 8 (eight) hours as needed for nausea or vomiting. 02/26/24   Newlin, Enobong, MD  propranolol  (INDERAL ) 10 MG tablet Take 1 tablet (10 mg total) by mouth 3 (three) times daily as needed (anxiety, at the onset of symptoms). 02/28/24   Shery Done, MD  terbinafine  (LAMISIL ) 250 MG tablet Take 1 tablet (250 mg total) by mouth daily. 12/24/23   Newlin, Enobong, MD  valsartan  (DIOVAN ) 80 MG tablet Take 1 tablet (80 mg total) by mouth daily. 12/24/23   Newlin, Enobong, MD    Allergies: Lamictal [lamotrigine], Lamictal [lamotrigine], Tramadol , Geodon  [ziprasidone  hcl], and Levetiracetam    Review of Systems  All other systems reviewed and are negative.   Updated Vital Signs BP (!) 142/83 (BP Location: Right Arm)   Pulse 92   Temp 98.7  F (37.1 C)   Resp 18   Ht 5' 3 (1.6 m)   Wt 65.8 kg   LMP 10/27/2019   SpO2 97%   BMI 25.69 kg/m   Physical Exam Vitals and nursing note reviewed.  Constitutional:      General: She is not in acute distress.    Appearance: She is well-developed. She is not diaphoretic.  HENT:     Head: Normocephalic and atraumatic.  Neck:     Comments: There is a swollen area that is also somewhat erythematous just below the left mandible.  This is exquisitely tender to the touch.  There is no crepitus. Cardiovascular:     Rate and Rhythm: Normal rate and regular  rhythm.     Heart sounds: No murmur heard.    No friction rub. No gallop.  Pulmonary:     Effort: Pulmonary effort is normal. No respiratory distress.     Breath sounds: Normal breath sounds. No wheezing.  Abdominal:     General: Bowel sounds are normal. There is no distension.     Palpations: Abdomen is soft.     Tenderness: There is no abdominal tenderness.   Musculoskeletal:        General: Normal range of motion.     Cervical back: Normal range of motion and neck supple.   Skin:    General: Skin is warm and dry.   Neurological:     General: No focal deficit present.     Mental Status: She is alert and oriented to person, place, and time.     (all labs ordered are listed, but only abnormal results are displayed) Labs Reviewed  BASIC METABOLIC PANEL WITH GFR - Abnormal; Notable for the following components:      Result Value   Sodium 129 (*)    Chloride 91 (*)    CO2 21 (*)    Glucose, Bld 103 (*)    Anion gap 17 (*)    All other components within normal limits  CBC - Abnormal; Notable for the following components:   Hemoglobin 11.7 (*)    HCT 34.4 (*)    All other components within normal limits  URINALYSIS, ROUTINE W REFLEX MICROSCOPIC  PREGNANCY, URINE  TROPONIN T, HIGH SENSITIVITY  TROPONIN T, HIGH SENSITIVITY    EKG: EKG Interpretation Date/Time:  Monday March 10 2024 20:39:37 EDT Ventricular Rate:  92 PR Interval:  131 QRS Duration:  97 QT Interval:  360 QTC Calculation: 446 R Axis:   1  Text Interpretation: Sinus rhythm ST elev, probable normal early repol pattern rate increased from prior 10/23 Confirmed by Racheal Buddle [phone]) on 03/10/2024 8:42:40 PM  Radiology: Lenell Query Chest 2 View Result Date: 03/10/2024 EXAM: 2 VIEW(S) XRAY OF THE CHEST 03/10/2024 08:54:20 PM COMPARISON: 01/12/2022 CLINICAL HISTORY: Chest pain. 53 y/o female c/o toothache to L lower jaw x1 week. Reports new L neck swelling since yesterday. Pt reports difficulty swallowing due to  swelling, shob and chest pain x 2 hour. FINDINGS: LUNGS AND PLEURA: No focal pulmonary opacity. No pulmonary edema. No pleural effusion. No pneumothorax. HEART AND MEDIASTINUM: No acute abnormality of the cardiac and mediastinal silhouettes. BONES AND SOFT TISSUES: Very mild degenerative changes of the mid thoracic spine. No acute osseous abnormality. IMPRESSION: 1. No acute process. Electronically signed by: Zadie Herter MD 03/10/2024 08:59 PM EDT RP Workstation: [id number]    {Document cardiac monitor, telemetry assessment procedure when appropriate:32947} Procedures   Medications Ordered in the ED  sodium chloride   0.9 % bolus 1,000 mL (has no administration in time range)  ondansetron  (ZOFRAN ) injection 4 mg (has no administration in time range)  morphine  (PF) 4 MG/ML injection 4 mg (has no administration in time range)  clindamycin (CLEOCIN) IVPB 600 mg (has no administration in time range)      {Click here for ABCD2, HEART and other calculators REFRESH Note before signing:1}                              Medical Decision Making Amount and/or Complexity of Data Reviewed Labs: ordered. Radiology: ordered.  Risk Prescription drug management.   ***  {Document critical care time when appropriate  Document review of labs and clinical decision tools ie CHADS2VASC2, etc  Document your independent review of radiology images and any outside records  Document your discussion with family members, caretakers and with consultants  Document social determinants of health affecting pt's care  Document your decision making why or why not admission, treatments were needed:32947:::1}   Final diagnoses:  None    ED Discharge Orders     None

## 2024-03-11 ENCOUNTER — Emergency Department (HOSPITAL_BASED_OUTPATIENT_CLINIC_OR_DEPARTMENT_OTHER): Payer: Self-pay

## 2024-03-11 ENCOUNTER — Telehealth (HOSPITAL_COMMUNITY): Payer: Self-pay | Admitting: Student

## 2024-03-11 LAB — TROPONIN T, HIGH SENSITIVITY: Troponin T High Sensitivity: <15 ng/L (ref <19)

## 2024-03-11 MED ORDER — IOHEXOL 300 MG/ML  SOLN
100.0000 mL | Freq: Once | INTRAMUSCULAR | Status: AC | PRN
  Administered 2024-03-11: 100 mL via INTRAVENOUS

## 2024-03-11 MED ORDER — HYDROMORPHONE HCL 1 MG/ML IJ SOLN
1.0000 mg | Freq: Once | INTRAMUSCULAR | Status: AC
  Administered 2024-03-11: 1 mg via INTRAVENOUS
  Filled 2024-03-11: qty 1

## 2024-03-11 MED ORDER — CLINDAMYCIN HCL 300 MG PO CAPS: 300.0000 mg | ORAL_CAPSULE | Freq: Four times a day (QID) | ORAL | 0 refills | Status: DC

## 2024-03-11 MED ORDER — MORPHINE SULFATE (PF) 4 MG/ML IV SOLN
4.0000 mg | Freq: Once | INTRAVENOUS | Status: AC
  Administered 2024-03-11: 4 mg via INTRAVENOUS
  Filled 2024-03-11: qty 1

## 2024-03-11 MED ORDER — OXYCODONE-ACETAMINOPHEN 5-325 MG PO TABS: 1.0000 | ORAL_TABLET | Freq: Four times a day (QID) | ORAL | 0 refills | Status: DC | PRN

## 2024-03-11 MED ORDER — IOHEXOL 300 MG/ML  SOLN: 75.0000 mL | Freq: Once | INTRAMUSCULAR | Status: DC | PRN

## 2024-03-11 NOTE — Telephone Encounter (Signed)
 On Friday, 6/13, at approximately

## 2024-03-11 NOTE — Discharge Instructions (Signed)
 Begin taking clindamycin as prescribed.  Take ibuprofen  600 mg every 6 hours as needed for pain.  Begin taking Percocet as prescribed as needed for pain not relieved with ibuprofen .  Follow-up with primary doctor if symptoms or not improving in the next few days.

## 2024-03-13 ENCOUNTER — Other Ambulatory Visit: Payer: Self-pay

## 2024-03-13 MED ORDER — HYDROCODONE-ACETAMINOPHEN 5-325 MG PO TABS
1.0000 | ORAL_TABLET | Freq: Four times a day (QID) | ORAL | 0 refills | Status: DC | PRN
  Filled 2024-03-13 (×2): qty 12, 3d supply, fill #0

## 2024-03-16 ENCOUNTER — Other Ambulatory Visit: Payer: Self-pay | Admitting: Family Medicine

## 2024-03-16 DIAGNOSIS — R110 Nausea: Secondary | ICD-10-CM

## 2024-03-17 ENCOUNTER — Telehealth: Payer: Self-pay | Admitting: Family Medicine

## 2024-03-17 ENCOUNTER — Other Ambulatory Visit: Payer: Self-pay

## 2024-03-17 MED ORDER — PROMETHAZINE HCL 12.5 MG PO TABS
12.5000 mg | ORAL_TABLET | Freq: Three times a day (TID) | ORAL | 0 refills | Status: DC | PRN
Start: 2024-03-17 — End: 2024-03-24
  Filled 2024-03-17 (×2): qty 60, 20d supply, fill #0

## 2024-03-17 NOTE — Telephone Encounter (Signed)
 Copied from CRM [id number]. Topic: Clinical - Request for Lab/Test Order >> Mar 17, 2024  1:38 PM Dawna HERO wrote:  Reason for CRM: patient calling for another stool kit due to missing the last one, says she needs the same order as the last one due to her being uncomfortable and wanting answers.

## 2024-03-17 NOTE — Telephone Encounter (Signed)
Patient came in and picked up stool kit.

## 2024-03-17 NOTE — Telephone Encounter (Signed)
 Patient was called and informed that she can come to the office and pick up another kit.

## 2024-03-18 ENCOUNTER — Other Ambulatory Visit: Payer: Self-pay

## 2024-03-20 LAB — PANCREATIC ELASTASE, FECAL: Pancreatic Elastase, Fecal: 15 ug Elast./g — ABNORMAL LOW (ref >200)

## 2024-03-20 LAB — CALPROTECTIN, FECAL: Calprotectin, Fecal: 24 ug/g (ref 0–120)

## 2024-03-21 ENCOUNTER — Ambulatory Visit: Payer: Self-pay

## 2024-03-21 ENCOUNTER — Ambulatory Visit: Payer: Self-pay | Admitting: *Deleted

## 2024-03-21 NOTE — Telephone Encounter (Signed)
 Copied from CRM [id number]. Topic: Clinical - Red Word Triage >> Mar 21, 2024  3:09 PM Delon HERO wrote: Red Word that prompted transfer to Nurse Triage: Patient is calling to calling to report pain all over her abdominal area  and her colon with bout for diarrhea. And going through a full bottle of imodium . Requesting pain medication  [facility] [place], [place] - [address] [[id number]] >> Mar 21, 2024  3:55 PM Montie POUR wrote: Margaret Mccann is calling back to speak with Triage Nurse. She does not want to go to the emergency room. Will Dr. Newlin call her in pain medication.

## 2024-03-21 NOTE — Telephone Encounter (Signed)
 FYI Only or Action Required?: Action required by provider: lab or test result follow-up needed.  Patient was last seen in primary care on 12/24/2023 by Newlin, Enobong, MD. Called Nurse Triage reporting No chief complaint on file.. Symptoms began today. Interventions attempted: Nothing. Symptoms are: stable.  Triage Disposition: No disposition on file. See note   Patient/caregiver understands and will follow disposition?: Yes       Copied from CRM [id number]. Topic: General - Other >> Mar 21, 2024  2:49 PM Myrick T wrote: Reason for CRM: patient returning NT call Answer Assessment - Initial Assessment Questions 1. REASON FOR CALL or QUESTION:    Patient noted her Calrpotectin and Pancreatic Elastase results from 03/18/24. No comments noted from PCP regarding results.    Patient concerned about her levels.   Reports she does have an appointment 03/24/24, but  I don't want to go into the weekend worried..   Patient requesting a call back regarding the lab results.  Message routed tot the office appropriately.  Protocols used: Information Only Call - No Triage-A-AH

## 2024-03-21 NOTE — Telephone Encounter (Signed)
 Pt reports that she received concerning lab results earlier today that contributing to some severe pain. Pt has already requested narcotics from PCP (specifically percocet/vicodin), but has not heard back. Triager advised that note has been sent and is pending.   Pt requesting to speak with in-house staff. Triager called PCP CAL and Daniella notified of pt request for pain medication, patient transferred for further assistance from in-house staff.

## 2024-03-21 NOTE — Telephone Encounter (Signed)
 Second attempt to contact patient- no answer- left message to call office

## 2024-03-21 NOTE — Telephone Encounter (Signed)
 Third attempt to reach patient- no answer- left message to call office- message forwarded to office

## 2024-03-21 NOTE — Telephone Encounter (Signed)
 Patient is requesting an update regarding lab test. She states she is in so much pain and requesting pain medication to get her through the weekend until she is seen at her appointment on Monday.   She states she is allergic to Tramadol  (noted in allergies) requesting Vicodin. Previous Vicodin and Percocet prescription was sent in by oral surgeon and provider in the EDP for dental pain. Per patient, she has taken so much Imodium  because this is the only way she will not have diarrhea.   Encouraged stomach rest, decrease her food intake, drink water . Strongly advised patient to go to the ED d/t the amount of pain she is having. She states because she is uninsured, she would not go to the ED to accrue a $4,000 medical bill.   Last ED visit was 03/10/2024.

## 2024-03-21 NOTE — Telephone Encounter (Signed)
 Attempted to contact patient - no answer- left message to call office    Copied from CRM [id number]. Topic: Clinical - Lab/Test Results >> Mar 21, 2024  9:06 AM Travis F wrote: Reason for CRM: Patient is calling in because she received her results from her stool sample and she she has some questions and concerns. Patient is requesting a nurse call her to discuss.

## 2024-03-21 NOTE — Telephone Encounter (Signed)
 FYI Only or Action Required?: Action required by provider: narcotic pain medication.  Patient was last seen in primary care on 12/24/2023 by Newlin, Enobong, MD. Called Nurse Triage reporting Abdominal Pain. Symptoms began several months ago. Interventions attempted: OTC medications: tylenol  and ibuprofen  and Rest, hydration, or home remedies. Symptoms are: unchanged.  Triage Disposition: Go to ED Now (Notify PCP)-patient refused ED-wants a narcotic prescription sent to pharmacy in CRM.  Patient/caregiver understands and will follow disposition?: No, wishes to speak with PCP  Copied from CRM [id number]. Topic: Clinical - Red Word Triage >> Mar 21, 2024  3:09 PM Delon HERO wrote: Red Word that prompted transfer to Nurse Triage: Patient is calling to calling to report pain all over her abdominal area  and her colon with bout for diarrhea. And going through a full bottle of imodium . Requesting pain medication  [facility] - [place], [place] - [address] [[id number]] Reason for Disposition  [1] SEVERE pain (e.g., excruciating) AND [2] present > 1 hour  Answer Assessment - Initial Assessment Questions 1. LOCATION: Where does it hurt?      General abdominal area 2. RADIATION: Does the pain shoot anywhere else? (e.g., chest, back)     Goes into back 3. ONSET: When did the pain begin? (e.g., minutes, hours or days ago)      Initially started back in January 4. SUDDEN: Gradual or sudden onset?     gradual 5. PATTERN Does the pain come and go, or is it constant?    - If it comes and goes: How long does it last? Do you have pain now?     (Note: Comes and goes means the pain is intermittent. It goes away completely between bouts.)    - If constant: Is it getting better, staying the same, or getting worse?      (Note: Constant means the pain never goes away completely; most serious pain is constant and gets worse.)      constant 6. SEVERITY: How bad is the pain?   (e.g., Scale 1-10; mild, moderate, or severe)    - MILD (1-3): Doesn't interfere with normal activities, abdomen soft and not tender to touch..     - MODERATE (4-7): Interferes with normal activities or awakens from sleep, abdomen tender to touch.     - SEVERE (8-10): Excruciating pain, doubled over, unable to do any normal activities.       severe 7. RECURRENT SYMPTOM: Have you ever had this type of stomach pain before? If Yes, ask: When was the last time? and What happened that time?      yes 8. AGGRAVATING FACTORS: Does anything seem to cause this pain? (e.g., foods, stress, alcohol )     Patient states food can make it worst.  9. CARDIAC SYMPTOMS: Do you have any of the following symptoms: chest pain, difficulty breathing, sweating, nausea?     Nausea, vomiting.  10. OTHER SYMPTOMS: Do you have any other symptoms? (e.g., back pain, diarrhea, fever, urination pain, vomiting)       Diarrhea  Endorses generalized abdominal pain with diarrhea. Patient states she used a bottle of imodium  a day. Patient's stool sample results came in and patient is very uptight. Patient is needing some guidance. Patient is asking for narcotic prescription to get her through until Monday. Patient refusing ED  Protocols used: Abdominal Pain - Upper-A-AH

## 2024-03-21 NOTE — Telephone Encounter (Addendum)
 Patient is requesting an update regarding lab test. She states she is in so much pain and requesting pain medication to get her through the weekend until she is seen at her appointment on Monday.    She states she is allergic to Tramadol  (noted in allergies) requesting Vicodin. Previous Vicodin and Percocet prescription was sent in by oral surgeon and provider in the EDP for dental pain. Per patient, she has taken so much Imodium  because this is the only way she will not have diarrhea.    Encouraged stomach rest, decrease her food intake, drink water . Strongly advised patient to go to the ED d/t the amount of pain she is having. She states because she is uninsured, she would not go to the ED to accrue a $4,000 medical bill.   Last ED visit was 03/10/2024.    Updated pharmacy to preferred location:  [facility] [address].

## 2024-03-22 ENCOUNTER — Ambulatory Visit: Payer: Self-pay | Admitting: Family Medicine

## 2024-03-22 DIAGNOSIS — K8689 Other specified diseases of pancreas: Secondary | ICD-10-CM

## 2024-03-22 NOTE — Telephone Encounter (Signed)
 I

## 2024-03-22 NOTE — Telephone Encounter (Signed)
 I have sent her a my chart message with lab results and referred her to GI As final lab results reveal pancreatic insufficiency meanwhile she can use Imodium  if Diarrhea occurs until evaluated by GI.

## 2024-03-22 NOTE — Progress Notes (Deleted)
 BH MD OP Progress Note    Margaret Mccann  MRN:  [id number]  Chief Complaint: MM follow-up    Assessment and Plan: 53 year old female seen today for follow-up psychiatric evaluation.  She has a psychiatric history of depression, PTSD, GAD, substance use (tobacco, cocaine, alcohol ).   ***  #GAD (generalized anxiety disorder) - Continue mirtazapine  45 MG at bedtime. - Continue- hydrOXYzine  50 MG 3 (three) times daily as needed for anxiety (patient taking 2 times daily on average) - Continue- desvenlafaxine  (PRISTIQ ) 75 mg daily.  - Continue gabapentin  600 mg QID - Increase to propranolol  10 MG tablets three times daily as needed for anxiety.  Take at the onset of anxiety symptoms rather than scheduled.  #Major depressive disorder, recurrent episode, in partial remission with anxious distress (HCC) - Continue mirtazapine  detailed above - Continue- hydrOXYzine  detailed above - Continue- desvenlafaxine  (PRISTIQ ) detailed above   HPI:  Patient seen ***.  Patient reports feeling *** today. Since the previous visit, ***. Patient reports *** sleep, ***. Patient reports *** appetite, ***.   Patient rates anxiety a ***/10, depression a ***/10, and anger a ***/10.  Life updates: ***  Stressors include ***. Coping strategies include ***.   Regarding medications, patient notes ***. Patient reports the following adverse effects: ***.   Substance use ***  Patient denies current SI, HI, and AVH. ***   Visit Diagnosis:  No diagnosis found.   Past Psychiatric History:  MDD , anxiety, PTSD, Substance abuse (cocaine, alcohol )  Past Medical History:  Past Medical History:  Diagnosis Date   Alcohol  withdrawal seizure with complication, with unspecified complication (HCC) 12/23/2020   Anxiety    Bipolar 1 disorder (HCC)    Depression    Intentional drug overdose (HCC) 08/04/2020   Major depressive disorder, recurrent episode with anxious distress (HCC) 08/11/2020    Morgellons syndrome    MRSA (methicillin resistant Staphylococcus aureus)    Suicide attempt (HCC)    Suicide by drug overdose (HCC) 08/11/2020   Tricuspid valve regurgitation 12/18/2020    Past Surgical History:  Procedure Laterality Date   APPENDECTOMY      Family Psychiatric History: Sister - committed suicide in 2005 by overdosing.  Family History:  Family History  Problem Relation Age of Onset   Heart attack Father 73   Stroke Father    Severe combined immunodeficiency Sister    Breast cancer Paternal Aunt    Ulcerative colitis Neg Hx    Esophageal cancer Neg Hx     Social History:  Social History   Socioeconomic History   Marital status: Single    Spouse name: Not on file   Number of children: Not on file   Years of education: Not on file   Highest education level: Bachelor's degree (e.g., BA, AB, BS)  Occupational History   Not on file  Tobacco Use   Smoking status: Some Days    Current packs/day: 0.25    Average packs/day: 0.3 packs/day for 5.0 years (1.3 ttl pk-yrs)    Types: Cigarettes   Smokeless tobacco: Never  Vaping Use   Vaping status: Never Used  Substance and Sexual Activity   Alcohol  use: Yes    Comment: occas   Drug use: Not Currently    Types: Cocaine    Comment: relapsed the past weekend, no cocaine use prior 6 months   Sexual activity: Not Currently    Birth control/protection: Pill  Other Topics Concern   Not on file  Social History Narrative   **  Merged History Encounter **       Social Drivers of Health   Financial Resource Strain: High Risk (03/20/2024)   Overall Financial Resource Strain (CARDIA)    Difficulty of Paying Living Expenses: Hard  Food Insecurity: Food Insecurity Present (03/20/2024)   Hunger Vital Sign    Worried About Running Out of Food in the Last Year: Sometimes true    Ran Out of Food in the Last Year: Sometimes true  Transportation Needs: No Transportation Needs (03/20/2024)   PRAPARE - Therapist, art (Medical): No    Lack of Transportation (Non-Medical): No  Physical Activity: Insufficiently Active (03/20/2024)   Exercise Vital Sign    Days of Exercise per Week: 1 day    Minutes of Exercise per Session: 20 min  Stress: Stress Concern Present (03/20/2024)   [employer] of Occupational Health - Occupational Stress Questionnaire    Feeling of Stress: Very much  Social Connections: Socially Isolated (03/20/2024)   Social Connection and Isolation Panel    Frequency of Communication with Friends and Family: More than three times a week    Frequency of Social Gatherings with Friends and Family: More than three times a week    Attends Religious Services: Never    Database administrator or Organizations: No    Attends Engineer, structural: Not on file    Marital Status: Divorced    Allergies:  Allergies  Allergen Reactions   Lamictal [Lamotrigine] Anaphylaxis, Rash and Other (See Comments)    Stevens-Johnson syndrome and fevers, also   Lamictal [Lamotrigine] Anaphylaxis, Rash and Other (See Comments)    Fevers and Stevens-Johnson syndrome, also   Tramadol  Other (See Comments)    Pt had a seizure after taking Tramadol !!   Geodon  [Ziprasidone  Hcl] Rash   Levetiracetam Anxiety    Metabolic Disorder Labs: Lab Results  Component Value Date   HGBA1C 5.7 (H) 12/24/2023   MPG 116.89 07/20/2022   MPG 114.02 12/19/2020   No results found for: PROLACTIN Lab Results  Component Value Date   CHOL 213 (H) 12/24/2023   TRIG 134 12/24/2023   HDL 86 12/24/2023   CHOLHDL 3.4 07/20/2022   VLDL 23 07/20/2022   LDLCALC 104 (H) 12/24/2023   LDLCALC 118 (H) 07/20/2022   Lab Results  Component Value Date   TSH 3.544 07/20/2022   TSH 1.300 05/16/2021    Therapeutic Level Labs: No results found for: LITHIUM No results found for: VALPROATE No results found for: CBMZ  Current Medications: Current Outpatient Medications  Medication Sig Dispense  Refill   atorvastatin  (LIPITOR) 20 MG tablet Take 1 tablet (20 mg total) by mouth at bedtime. 90 tablet 1   clindamycin  (CLEOCIN ) 300 MG capsule Take 1 capsule (300 mg total) by mouth 4 (four) times daily. X 7 days 28 capsule 0   cloNIDine  (CATAPRES ) 0.2 MG tablet Take 1 tablet (0.2 mg total) by mouth at bedtime. 30 tablet 2   desvenlafaxine  (PRISTIQ ) 25 MG 24 hr tablet Take 3 tablets (75 mg total) by mouth daily. 90 tablet 2   gabapentin  (NEURONTIN ) 600 MG tablet Take 1 tablet (600 mg total) by mouth in the morning, at noon, in the evening, and at bedtime. 120 tablet 1   HYDROcodone -acetaminophen  (NORCO/VICODIN) 5-325 MG tablet Take 1 tablet by mouth every 6 (six) hours as needed. 12 tablet 0   hydrOXYzine  (ATARAX ) 50 MG tablet Take 1 tablet (50 mg total) by mouth 3 (three) times daily as  needed for anxiety. 90 tablet 2   mirtazapine  (REMERON  SOL-TAB) 45 MG disintegrating tablet Take 1 tablet (45 mg total) by mouth at bedtime. 30 tablet 2   norethindrone  (INCASSIA ) 0.35 MG tablet Take 1 tablet (0.35 mg total) by mouth daily. 84 tablet 0   oxyCODONE -acetaminophen  (PERCOCET) 5-325 MG tablet Take 1-2 tablets by mouth every 6 (six) hours as needed. 12 tablet 0   pantoprazole  (PROTONIX ) 40 MG tablet Take 1 tablet (40 mg total) by mouth 2 (two) times daily before a meal. 90 tablet 1   promethazine  (PHENERGAN ) 12.5 MG tablet Take 1 tablet (12.5 mg total) by mouth every 8 (eight) hours as needed for nausea or vomiting. 60 tablet 0   propranolol  (INDERAL ) 10 MG tablet Take 1 tablet (10 mg total) by mouth 3 (three) times daily as needed (anxiety, at the onset of symptoms). 90 tablet 1   terbinafine  (LAMISIL ) 250 MG tablet Take 1 tablet (250 mg total) by mouth daily. 30 tablet 2   valsartan  (DIOVAN ) 80 MG tablet Take 1 tablet (80 mg total) by mouth daily. 90 tablet 1   No current facility-administered medications for this visit.    Psychiatric specialty exam:  General Appearance: appears at stated age,  casually dressed and groomed ***  Behavior: pleasant and cooperative ***  Psychomotor Activity: no psychomotor agitation or retardation noted ***  Eye Contact: fair *** Speech: normal amount, volume and fluency ***   Mood: euthymic *** Affect: congruent, pleasant and interactive ***  Thought Process: linear, goal directed, no circumstantial or tangential thought process noted, no racing thoughts or flight of ideas *** Descriptions of Associations: intact ***  Thought Content Hallucinations: denies AH, VH , does not appear responding to stimuli *** Delusions: no paranoia, delusions of control, grandeur, ideas of reference, thought broadcasting, and magical thinking *** Suicidal Thoughts: denies SI, intention, plan *** Homicidal Thoughts: denies HI, intention, plan ***  Alertness/Orientation: alert and fully oriented ***  Insight: fair*** Judgment: fair***  Memory: intact ***  Executive Functions  Concentration: intact *** Attention Span: fair *** Recall: intact *** Fund of Knowledge: fair ***   Screenings: AIMS    Flowsheet Row [facility]  AIMS Total Score 0   AUDIT    Flowsheet Row Ad[facility] (Dis[facility] Ad[facility] (Dis[facility] Admission (Discharged) [facility]  Alcohol  Use Disorder Identification Test Final Score (AUDIT) 6 11 0   GAD-7    Flowsheet Row Office Visit from 12/24/2023 in [facility] [place] - A Dept Of [place]. [facility] Office Visit from 06/12/2023 in [facility] [place] - A Dept Of [place] [facility]. [facility] Clinical Support from 06/04/2023 in [facility] Office Visit from 01/09/2023 in [facility] [place] - A Dept Of  [place]. [facility] Video Visit from 07/31/2022 in [facility]  Total GAD-7 Score 5 3 12 11 9    PHQ2-9    Flowsheet Row Office Visit from 12/24/2023 in [facility] [place] - A Dept Of [place]. [facility] Office Visit from 06/12/2023 in [facility] [place] - A Dept Of [place] [facility]. [facility] Clinical Support from 06/04/2023 in [facility] Office Visit from 01/09/2023 in [facility] Dept Of [place]. [facility] Video Visit from 07/31/2022 in [facility]  PHQ-2 Total Score 0 0 0 0 1  PHQ-9 Total Score 6 -- 10 -- 6   Flowsheet Row ED from 03/10/2024 in [facility] ED from 11/25/2023 in [facility] at [facility]mission (Discharged[facility]  C-SSRS RISK CATEGORY No Risk No Risk No Risk    Ismael KATHEE Franco, MD PGY-3 Psychiatry Resident

## 2024-03-24 ENCOUNTER — Other Ambulatory Visit (HOSPITAL_BASED_OUTPATIENT_CLINIC_OR_DEPARTMENT_OTHER): Payer: Self-pay

## 2024-03-24 ENCOUNTER — Telehealth (HOSPITAL_BASED_OUTPATIENT_CLINIC_OR_DEPARTMENT_OTHER): Payer: Self-pay | Admitting: Family Medicine

## 2024-03-24 ENCOUNTER — Encounter: Payer: Self-pay | Admitting: Family Medicine

## 2024-03-24 ENCOUNTER — Ambulatory Visit: Payer: Self-pay | Admitting: Family Medicine

## 2024-03-24 DIAGNOSIS — F332 Major depressive disorder, recurrent severe without psychotic features: Secondary | ICD-10-CM

## 2024-03-24 DIAGNOSIS — R110 Nausea: Secondary | ICD-10-CM

## 2024-03-24 DIAGNOSIS — I10 Essential (primary) hypertension: Secondary | ICD-10-CM

## 2024-03-24 DIAGNOSIS — G8929 Other chronic pain: Secondary | ICD-10-CM

## 2024-03-24 DIAGNOSIS — K8689 Other specified diseases of pancreas: Secondary | ICD-10-CM

## 2024-03-24 MED ORDER — PROMETHAZINE HCL 25 MG PO TABS
25.0000 mg | ORAL_TABLET | Freq: Three times a day (TID) | ORAL | 1 refills | Status: DC | PRN
  Filled 2024-03-24 – 2024-03-25 (×3): qty 60, 20d supply, fill #0

## 2024-03-24 NOTE — Patient Instructions (Addendum)
 VISIT SUMMARY:  Today, we discussed your ongoing issues with nausea, pain management, and high blood pressure. We have made some adjustments to your medications and provided referrals to specialists to better manage your symptoms.  YOUR PLAN:  -PANCREATIC INSUFFICIENCY -  We have referred you to a gastroenterologist for further evaluation. If you do not hear from [facility] about your appointment, please contact them directly.  -NAUSEA: You are experiencing persistent nausea and frequent vomiting. We have increased your promethazine  dose to 25 mg to help better control your symptoms. The prescription has been sent to [facility].  -CHRONIC PAIN: Your current pain management regimen is not providing sufficient relief. We will discuss with your psychiatrist the possibility of switching from Pristiq  to Cymbalta, which may help manage both your pain and psychiatric symptoms.  -HYPERTENSION: Your blood pressure readings at home are consistently high. This may be related to your pain levels. We will continue your current propranolol  and Valsartan   regimen and ask you to keep a daily log of your blood pressure readings to monitor trends.  INSTRUCTIONS:  Please follow up with gastroenterology for the evaluation of your pancreatic insufficiency. If you do not receive a call regarding your appointment, contact [facility] directly. Continue taking your current medications as prescribed, and start the increased dose of promethazine  for nausea. Keep a daily log of your blood pressure readings and bring it to your next appointment. We will also discuss with your psychiatrist the possibility of switching from Pristiq  to Cymbalta for better pain management.

## 2024-03-24 NOTE — Progress Notes (Signed)
 Virtual Visit via Video Note  I connected with Margaret Mccann, on 03/24/2024 at 11:01 AM by video enabled telemedicine device and verified that I am speaking with the correct person using two identifiers.   Consent: I discussed the limitations, risks, security and privacy concerns of performing an evaluation and management service by telemedicine and the availability of in person appointments. I also discussed with the patient that there may be a patient responsible charge related to this service. The patient expressed understanding and agreed to proceed.   Location of Patient: Home  Location of Provider: Clinic   Persons participating in Telemedicine visit: Shonia Skilling QmpiiozBjlw Dr. Delbert    Discussed the use of AI scribe software for clinical note transcription with the patient, who gave verbal consent to proceed.  History of Present Illness Margaret Mccann is a 53 year old female with a history of PTSD, major depression, severe anxiety, previous suicidal attempts, substance abuse (nicotine , cocaine, alcohol ), and hypertension, hyperlipidemia  pancreatic insufficiency who presents with nausea and pain management issues.  Recent lab results revealed a diagnosis of pancreatic insufficiency for which she had referred her to GI 2 days ago.  She experiences chronic diarrhea. She also experiences significant nausea and frequent vomiting. Promethazine  12.5 mg is currently used for nausea but is insufficient for her symptoms.  She is wondering if Compazine can be prescribed for her.  She has severe pain unrelieved by Advil  and Tylenol . She is on gabapentin  600 mg four times a day for psychiatric reasons and Pristiq . She describes the pain as 'miserable' and seeks stronger pain management options.  Her blood pressure readings at home are consistently high per patient, with systolic values ranging from 150 to 160 and diastolic values up to 115. She is  taking valsartan  and propranolol  for blood pressure management.  At her last visit with me, her blood pressure was 120/84.  She has a history of psychiatric medication adjustments, previously on Prozac  and Wellbutrin, but switched to Pristiq  after a seizure. No current symptoms of dizziness or lightheadedness.      Past Medical History:  Diagnosis Date   Alcohol  withdrawal seizure with complication, with unspecified complication (HCC) 12/23/2020   Anxiety    Bipolar 1 disorder (HCC)    Depression    Intentional drug overdose (HCC) 08/04/2020   Major depressive disorder, recurrent episode with anxious distress (HCC) 08/11/2020   Morgellons syndrome    MRSA (methicillin resistant Staphylococcus aureus)    Suicide attempt (HCC)    Suicide by drug overdose (HCC) 08/11/2020   Tricuspid valve regurgitation 12/18/2020   Allergies  Allergen Reactions   Lamictal [Lamotrigine] Anaphylaxis, Rash and Other (See Comments)    Stevens-Johnson syndrome and fevers, also   Lamictal [Lamotrigine] Anaphylaxis, Rash and Other (See Comments)    Fevers and Stevens-Johnson syndrome, also   Tramadol  Other (See Comments)    Pt had a seizure after taking Tramadol !!   Geodon  [Ziprasidone  Hcl] Rash   Levetiracetam Anxiety    Current Outpatient Medications on File Prior to Visit  Medication Sig Dispense Refill   atorvastatin  (LIPITOR) 20 MG tablet Take 1 tablet (20 mg total) by mouth at bedtime. 90 tablet 1   clindamycin  (CLEOCIN ) 300 MG capsule Take 1 capsule (300 mg total) by mouth 4 (four) times daily. X 7 days 28 capsule 0   cloNIDine  (CATAPRES ) 0.2 MG tablet Take 1 tablet (0.2 mg total) by mouth at bedtime. 30 tablet 2   desvenlafaxine  (PRISTIQ ) 25 MG  24 hr tablet Take 3 tablets (75 mg total) by mouth daily. 90 tablet 2   gabapentin  (NEURONTIN ) 600 MG tablet Take 1 tablet (600 mg total) by mouth in the morning, at noon, in the evening, and at bedtime. 120 tablet 1   HYDROcodone -acetaminophen   (NORCO/VICODIN) 5-325 MG tablet Take 1 tablet by mouth every 6 (six) hours as needed. 12 tablet 0   hydrOXYzine  (ATARAX ) 50 MG tablet Take 1 tablet (50 mg total) by mouth 3 (three) times daily as needed for anxiety. 90 tablet 2   mirtazapine  (REMERON  SOL-TAB) 45 MG disintegrating tablet Take 1 tablet (45 mg total) by mouth at bedtime. 30 tablet 2   norethindrone  (INCASSIA ) 0.35 MG tablet Take 1 tablet (0.35 mg total) by mouth daily. 84 tablet 0   oxyCODONE -acetaminophen  (PERCOCET) 5-325 MG tablet Take 1-2 tablets by mouth every 6 (six) hours as needed. 12 tablet 0   pantoprazole  (PROTONIX ) 40 MG tablet Take 1 tablet (40 mg total) by mouth 2 (two) times daily before a meal. 90 tablet 1   propranolol  (INDERAL ) 10 MG tablet Take 1 tablet (10 mg total) by mouth 3 (three) times daily as needed (anxiety, at the onset of symptoms). 90 tablet 1   terbinafine  (LAMISIL ) 250 MG tablet Take 1 tablet (250 mg total) by mouth daily. 30 tablet 2   valsartan  (DIOVAN ) 80 MG tablet Take 1 tablet (80 mg total) by mouth daily. 90 tablet 1   No current facility-administered medications on file prior to visit.    ROS: See HPI  Observations/Objective: Awake, alert, oriented x3 BP - 109/79 -on the patient's blood pressure monitor Not in acute distress Normal mood      Latest Ref Rng & Units 03/10/2024    8:47 PM 12/24/2023   11:51 AM 01/12/2023    4:07 PM  CMP  Glucose 70 - 99 mg/dL 896  81  91   BUN 6 - 20 mg/dL 10  11  6    Creatinine 0.44 - 1.00 mg/dL 9.09  9.09  9.24   Sodium 135 - 145 mmol/L 129  134  138   Potassium 3.5 - 5.1 mmol/L 4.8  4.5  4.6   Chloride 98 - 111 mmol/L 91  99  102   CO2 22 - 32 mmol/L 21  16  20    Calcium  8.9 - 10.3 mg/dL 89.8  9.8  9.5   Total Protein 6.0 - 8.5 g/dL  6.9  6.9   Total Bilirubin 0.0 - 1.2 mg/dL  0.2  <9.7   Alkaline Phos 44 - 121 IU/L  84  97   AST 0 - 40 IU/L  37  24   ALT 0 - 32 IU/L  49  31     Lipid Panel     Component Value Date/Time   CHOL 213 (H)  12/24/2023 1151   TRIG 134 12/24/2023 1151   HDL 86 12/24/2023 1151   CHOLHDL 3.4 07/20/2022 0706   VLDL 23 07/20/2022 0706   LDLCALC 104 (H) 12/24/2023 1151   LABVLDL 23 12/24/2023 1151    Lab Results  Component Value Date   HGBA1C 5.7 (H) 12/24/2023     Assessment and plan: Assessment and Plan Assessment & Plan Pancreatic insufficiency Chronic diarrhea possibly due to pancreatic insufficiency.  Referred to gastroenterology. - Contact [facility] if no call is received regarding the gastroenterology appointment.  Nausea Persistent nausea with frequent vomiting. Current promethazine  dose may be ineffective. Increased dose expected to improve control. - Increase  promethazine  to 25 mg for nausea. - Send prescription to [facility] at [facility].  Chronic Pain Pain not managed with current regimen. Gabapentin  used for psychiatric reasons but has analgesic properties. Consider Cymbalta for dual management. - Discuss with psychiatrist the possibility of switching from Pristiq  to Cymbalta for pain management.  Hypertension Elevated home blood pressure readings. Variability possibly related to pain. Pain management expected to aid control. -Current blood pressure this morning is 109/79 - No indication to adjust her regimen - Continue valsartan  and propranolol  - Keep a daily log of blood pressure readings to monitor trends.   There are no diagnoses linked to this encounter.   Meds ordered this encounter  Medications   promethazine  (PHENERGAN ) 25 MG tablet    Sig: Take 1 tablet (25 mg total) by mouth every 8 (eight) hours as needed for nausea or vomiting.    Dispense:  60 tablet    Refill:  1    Follow Up Instructions: Previously scheduled appointment   I discussed the assessment and treatment plan with the patient. The patient was provided an opportunity to ask questions and all were answered. The patient agreed with the plan and demonstrated  an understanding of the instructions.   The patient was advised to call back or seek an in-person evaluation if the symptoms worsen or if the condition fails to improve as anticipated.     I provided 18 minutes total of Telehealth time during this encounter including median intraservice time, reviewing previous notes, investigations, ordering medications, medical decision making, coordinating care and patient verbalized understanding at the end of the visit.     Corrina Sabin, MD, FAAFP. [facility] [place], [place] [phone]   03/24/2024, 11:01 AM

## 2024-03-25 ENCOUNTER — Encounter (HOSPITAL_COMMUNITY): Payer: Self-pay

## 2024-03-25 ENCOUNTER — Other Ambulatory Visit (HOSPITAL_BASED_OUTPATIENT_CLINIC_OR_DEPARTMENT_OTHER): Payer: Self-pay

## 2024-03-25 ENCOUNTER — Other Ambulatory Visit: Payer: Self-pay

## 2024-03-25 ENCOUNTER — Encounter (HOSPITAL_COMMUNITY): Payer: Self-pay | Admitting: Psychiatry

## 2024-03-25 ENCOUNTER — Telehealth (HOSPITAL_COMMUNITY): Payer: Self-pay

## 2024-03-25 ENCOUNTER — Other Ambulatory Visit: Payer: Self-pay | Admitting: Family Medicine

## 2024-03-25 ENCOUNTER — Other Ambulatory Visit (HOSPITAL_COMMUNITY): Payer: Self-pay

## 2024-03-25 MED ORDER — PANTOPRAZOLE SODIUM 40 MG PO TBEC
40.0000 mg | DELAYED_RELEASE_TABLET | Freq: Two times a day (BID) | ORAL | 1 refills | Status: DC
  Filled 2024-03-25: qty 180, 90d supply, fill #0
  Filled 2024-06-20: qty 180, 90d supply, fill #1

## 2024-03-25 NOTE — Patient Instructions (Incomplete)
 [facility] Patient Name: Margaret Mccann Date of Visit: 03/25/24  Provider Name: Dr. Izella   Dear Margaret Mccann, it was a pleasure seeing you today, and we appreciate the opportunity to care for you.   These are the changes we have made to your medications:  ***   I recommend the following laboratory tests for you to have done. Please request these from your primary care provider (PCP): ***   Lifestyle Recommendations Sleep Hygiene: Aim for 7-9 hours of quality sleep each night. Establish a regular sleep routine and create a restful environment. Stress Management: Practice stress-reducing techniques such as mindfulness, meditation, deep breathing exercises, or hobbies that you enjoy. Smoking Cessation: If you smoke, consider quitting. We can provide resources and support to help you. Alcohol  Consumption: Limit alcohol  intake to moderate levels--up to one drink per day for women and up to two drinks per day for men. Routine Health Screenings: Stay up-to-date with routine health screenings and vaccinations as recommended.   Additional Resources:  988 for crisis

## 2024-03-25 NOTE — Telephone Encounter (Signed)
 Hello,   Pt called today stating that she is being diagnosed with pancreatic cancer and states that she wants a refill of,  Clonidine  and mirtazapine  and hydroxyzine  (early).   [facility] Pt states she wants them to be filled early if possible. Pt states that she wants to talk to a provider about her symptoms and feelings. Also that this is the worse she has been in her life regarding mental health. Me and front desk told patient if symptoms worsen to go to ED and also urgent care if pancreatic symptoms worsen. And our earlier APPS for a sooner APP provider.     JNL, CMA

## 2024-03-27 ENCOUNTER — Encounter: Payer: Self-pay | Admitting: Physician Assistant

## 2024-03-27 ENCOUNTER — Telehealth: Payer: Self-pay | Admitting: Internal Medicine

## 2024-03-27 ENCOUNTER — Other Ambulatory Visit: Payer: Self-pay | Admitting: Family Medicine

## 2024-03-27 MED ORDER — CYCLOBENZAPRINE HCL 10 MG PO TABS
10.0000 mg | ORAL_TABLET | Freq: Two times a day (BID) | ORAL | 1 refills | Status: DC | PRN
  Filled 2024-05-02: qty 60, 30d supply, fill #0

## 2024-03-27 NOTE — Telephone Encounter (Signed)
 Pt scheduled to see [name] PA 04/14/24@8 :40am. Please notify pt of new appt.

## 2024-03-27 NOTE — Telephone Encounter (Signed)
 Urgent referral in [place] for Pancreatic insufficiency.  Last was seen in 2018 and assigned to Dr. Abran.  Patient is scheduled for 05/20/24 and requesting sooner appt.  Please advise scheduling.  Thanks

## 2024-03-27 NOTE — Telephone Encounter (Signed)
 Called and left detailed message on vmail.  Mailed paperwork

## 2024-04-02 ENCOUNTER — Other Ambulatory Visit (HOSPITAL_COMMUNITY): Payer: Self-pay

## 2024-04-03 ENCOUNTER — Telehealth (HOSPITAL_COMMUNITY): Payer: Self-pay | Admitting: *Deleted

## 2024-04-03 ENCOUNTER — Other Ambulatory Visit (HOSPITAL_COMMUNITY): Payer: Self-pay | Admitting: Psychiatry

## 2024-04-03 DIAGNOSIS — F411 Generalized anxiety disorder: Secondary | ICD-10-CM

## 2024-04-03 MED ORDER — GABAPENTIN 600 MG PO TABS: 600.0000 mg | ORAL_TABLET | Freq: Four times a day (QID) | ORAL | 0 refills | Status: DC

## 2024-04-03 NOTE — Telephone Encounter (Signed)
 Patient called asking for refill of Gabapentin . Next appointment 7/18 and states she will be coming.

## 2024-04-09 ENCOUNTER — Other Ambulatory Visit: Payer: Self-pay | Admitting: Family Medicine

## 2024-04-09 ENCOUNTER — Encounter: Payer: Self-pay | Admitting: Family Medicine

## 2024-04-09 MED ORDER — ONDANSETRON HCL 4 MG PO TABS: 4.0000 mg | ORAL_TABLET | Freq: Three times a day (TID) | ORAL | 0 refills | Status: DC | PRN

## 2024-04-09 NOTE — Progress Notes (Deleted)
 BH MD OP Progress Note    Margaret Mccann  MRN:  [id number]  Chief Complaint: MM follow-up    Assessment and Plan: 53 year old female seen today for follow-up psychiatric evaluation.  She has a psychiatric history of depression, PTSD, GAD, substance use (tobacco, cocaine, alcohol ).   *** #Major depressive disorder, recurrent episode, in partial remission with anxious distress (HCC) #GAD (generalized anxiety disorder) - Continue Remeron  45 MG at bedtime. - Continue Atarax  50 MG TID as needed for anxiety (patient taking 2 times daily on average) - Continue- Pristiq  75 mg daily.  - Continue gabapentin  600 mg QID - Increase to propranolol  10 MG tablets three times daily as needed for anxiety  Subjective Patient seen ***.  Patient reports feeling *** today. Since the previous visit, ***. Patient reports *** sleep, ***. Patient reports *** appetite, ***.   Patient rates anxiety a ***/10, depression a ***/10, and anger a ***/10.  Life updates: ***  Stressors include ***. Coping strategies include ***.   Regarding medications, patient notes ***. Patient reports the following adverse effects: ***.   Substance use ***  Patient denies current SI, HI, and AVH. ***   Visit Diagnosis:  No diagnosis found.   Past Psychiatric History:  MDD , anxiety, PTSD, Substance abuse (cocaine, alcohol ) Previous medications: *** Psychiatrist: previously saw Dr. Rainelle Therapist: *** Hospitalizations: *** History of SI: *** History of self harm: ***    Past Medical History:  Past Medical History:  Diagnosis Date   Alcohol  withdrawal seizure with complication, with unspecified complication (HCC) 12/23/2020   Anxiety    Bipolar 1 disorder (HCC)    Depression    Intentional drug overdose (HCC) 08/04/2020   Major depressive disorder, recurrent episode with anxious distress (HCC) 08/11/2020   Morgellons syndrome    MRSA (methicillin resistant Staphylococcus aureus)    Suicide  attempt (HCC)    Suicide by drug overdose (HCC) 08/11/2020   Tricuspid valve regurgitation 12/18/2020    Past Surgical History:  Procedure Laterality Date   APPENDECTOMY      Family Psychiatric History: Sister - committed suicide in 2005 by overdosing.  Family History:  Family History  Problem Relation Age of Onset   Heart attack Father 58   Stroke Father    Severe combined immunodeficiency Sister    Breast cancer Paternal Aunt    Ulcerative colitis Neg Hx    Esophageal cancer Neg Hx     Social History:  Living: *** Occupation: *** Relationship: *** Children: *** Support: *** Legal History: ***  Social History   Socioeconomic History   Marital status: Single    Spouse name: Not on file   Number of children: Not on file   Years of education: Not on file   Highest education level: Bachelor's degree (e.g., BA, AB, BS)  Occupational History   Not on file  Tobacco Use   Smoking status: Some Days    Current packs/day: 0.25    Average packs/day: 0.3 packs/day for 5.0 years (1.3 ttl pk-yrs)    Types: Cigarettes   Smokeless tobacco: Never  Vaping Use   Vaping status: Never Used  Substance and Sexual Activity   Alcohol  use: Yes    Comment: occas   Drug use: Not Currently    Types: Cocaine    Comment: relapsed the past weekend, no cocaine use prior 6 months   Sexual activity: Not Currently    Birth control/protection: Pill  Other Topics Concern   Not on file  Social History  Narrative   ** Merged History Encounter **       Social Drivers of Health   Financial Resource Strain: High Risk (03/20/2024)   Overall Financial Resource Strain (CARDIA)    Difficulty of Paying Living Expenses: Hard  Food Insecurity: Food Insecurity Present (03/20/2024)   Hunger Vital Sign    Worried About Running Out of Food in the Last Year: Sometimes true    Ran Out of Food in the Last Year: Sometimes true  Transportation Needs: No Transportation Needs (03/20/2024)   PRAPARE -  Administrator, Civil Service (Medical): No    Lack of Transportation (Non-Medical): No  Physical Activity: Insufficiently Active (03/20/2024)   Exercise Vital Sign    Days of Exercise per Week: 1 day    Minutes of Exercise per Session: 20 min  Stress: Stress Concern Present (03/20/2024)   [employer] of Occupational Health - Occupational Stress Questionnaire    Feeling of Stress: Very much  Social Connections: Socially Isolated (03/20/2024)   Social Connection and Isolation Panel    Frequency of Communication with Friends and Family: More than three times a week    Frequency of Social Gatherings with Friends and Family: More than three times a week    Attends Religious Services: Never    Database administrator or Organizations: No    Attends Engineer, structural: Not on file    Marital Status: Divorced    Allergies:  Allergies  Allergen Reactions   Lamictal [Lamotrigine] Anaphylaxis, Rash and Other (See Comments)    Stevens-Johnson syndrome and fevers, also   Lamictal [Lamotrigine] Anaphylaxis, Rash and Other (See Comments)    Fevers and Stevens-Johnson syndrome, also   Tramadol  Other (See Comments)    Pt had a seizure after taking Tramadol !!   Geodon  [Ziprasidone  Hcl] Rash   Levetiracetam Anxiety    Metabolic Disorder Labs: Lab Results  Component Value Date   HGBA1C 5.7 (H) 12/24/2023   MPG 116.89 07/20/2022   MPG 114.02 12/19/2020   No results found for: PROLACTIN Lab Results  Component Value Date   CHOL 213 (H) 12/24/2023   TRIG 134 12/24/2023   HDL 86 12/24/2023   CHOLHDL 3.4 07/20/2022   VLDL 23 07/20/2022   LDLCALC 104 (H) 12/24/2023   LDLCALC 118 (H) 07/20/2022   Lab Results  Component Value Date   TSH 3.544 07/20/2022   TSH 1.300 05/16/2021    Therapeutic Level Labs: No results found for: LITHIUM No results found for: VALPROATE No results found for: CBMZ  Current Medications: Current Outpatient Medications   Medication Sig Dispense Refill   atorvastatin  (LIPITOR) 20 MG tablet Take 1 tablet (20 mg total) by mouth at bedtime. 90 tablet 1   clindamycin  (CLEOCIN ) 300 MG capsule Take 1 capsule (300 mg total) by mouth 4 (four) times daily. X 7 days 28 capsule 0   cloNIDine  (CATAPRES ) 0.2 MG tablet Take 1 tablet (0.2 mg total) by mouth at bedtime. 30 tablet 2   cyclobenzaprine  (FLEXERIL ) 10 MG tablet Take 1 tablet (10 mg total) by mouth 2 (two) times daily as needed for muscle spasms. 60 tablet 1   desvenlafaxine  (PRISTIQ ) 25 MG 24 hr tablet Take 3 tablets (75 mg total) by mouth daily. 90 tablet 2   gabapentin  (NEURONTIN ) 600 MG tablet Take 1 tablet (600 mg total) by mouth in the morning, at noon, in the evening, and at bedtime for 7 days. 28 tablet 0   HYDROcodone -acetaminophen  (NORCO/VICODIN) 5-325 MG tablet  Take 1 tablet by mouth every 6 (six) hours as needed. 12 tablet 0   hydrOXYzine  (ATARAX ) 50 MG tablet Take 1 tablet (50 mg total) by mouth 3 (three) times daily as needed for anxiety. 90 tablet 2   mirtazapine  (REMERON  SOL-TAB) 45 MG disintegrating tablet Take 1 tablet (45 mg total) by mouth at bedtime. 30 tablet 2   norethindrone  (INCASSIA ) 0.35 MG tablet Take 1 tablet (0.35 mg total) by mouth daily. 84 tablet 0   oxyCODONE -acetaminophen  (PERCOCET) 5-325 MG tablet Take 1-2 tablets by mouth every 6 (six) hours as needed. 12 tablet 0   pantoprazole  (PROTONIX ) 40 MG tablet Take 1 tablet (40 mg total) by mouth 2 (two) times daily before a meal. 180 tablet 1   promethazine  (PHENERGAN ) 25 MG tablet Take 1 tablet (25 mg total) by mouth every 8 (eight) hours as needed for nausea or vomiting. 60 tablet 1   propranolol  (INDERAL ) 10 MG tablet Take 1 tablet (10 mg total) by mouth 3 (three) times daily as needed (anxiety, at the onset of symptoms). 90 tablet 1   terbinafine  (LAMISIL ) 250 MG tablet Take 1 tablet (250 mg total) by mouth daily. 30 tablet 2   valsartan  (DIOVAN ) 80 MG tablet Take 1 tablet (80 mg total)  by mouth daily. 90 tablet 1   No current facility-administered medications for this visit.    Psychiatric specialty exam:  General Appearance: appears at stated age, casually dressed and groomed ***  Behavior: pleasant and cooperative ***  Psychomotor Activity: no psychomotor agitation or retardation noted ***  Eye Contact: fair *** Speech: normal amount, volume and fluency ***   Mood: euthymic *** Affect: congruent, pleasant and interactive ***  Thought Process: linear, goal directed, no circumstantial or tangential thought process noted, no racing thoughts or flight of ideas *** Descriptions of Associations: intact ***  Thought Content Hallucinations: denies AH, VH , does not appear responding to stimuli *** Delusions: no paranoia, delusions of control, grandeur, ideas of reference, thought broadcasting, and magical thinking *** Suicidal Thoughts: denies SI, intention, plan *** Homicidal Thoughts: denies HI, intention, plan ***  Alertness/Orientation: alert and fully oriented ***  Insight: fair*** Judgment: fair***  Memory: intact ***  Executive Functions  Concentration: intact *** Attention Span: fair *** Recall: intact *** Fund of Knowledge: fair ***  Physical Exam *** General: Pleasant, well-appearing ***. No acute distress. Pulmonary: Normal effort. No wheezing or rales. Skin: No obvious rash or lesions. Neuro: A&Ox3.No focal deficit.  Review of Systems *** No reported symptoms   Screenings: AIMS    Flowsheet Row [facility]  AIMS Total Score 0   AUDIT    Flowsheet Row Ad[facility] (Dis[facility] Ad[facility] (Dis[facility] Admission (Discharged[facility]  Alcohol  Use Disorder Identification Test Final  Score (AUDIT) 6 11 0   GAD-7    Flowsheet Row Office Visit from 12/24/2023 in [facility] [place] [facility] Dept Of [place]. [facility] Office Visit from 06/12/2023 in [facility] [place] - A Dept Of [place]. [facility] Clinical Support from 06/04/2023 in [facility] Office Visit from 01/09/2023 in [facility] [place] - A Dept Of [place]. [facility] Video Visit from 07/31/2022 in [facility]  Total GAD-7 Score  5 3 12 11 9    PHQ2-9    Flowsheet Row Office Visit from 12/24/2023 in [facility] - A Dept Of [place]. [facility] Office Visit from 06/12/2023 in [facility] [place] - [facility] Dept Of [place]. [facility] Clinical Support from 06/04/2023 in [facility] Office Visit from 01/09/2023 in [facility] [place] - A Dept Of [place]. [facility] Video Visit from 07/31/2022 in [facility]  PHQ-2 Total Score 0 0 0 0 1  PHQ-9 Total Score 6 -- 10 -- 6   Flowsheet Row ED from 03/10/2024 in [facility] at [facility] ED from 11/25/2023 in [facility] Department at [facility] (Discharged[facility]  C-SSRS RISK CATEGORY No Risk No Risk No Risk   Ismael Franco, MD PGY-3 Psychiatry Resident

## 2024-04-11 ENCOUNTER — Encounter (HOSPITAL_COMMUNITY): Payer: Self-pay | Admitting: Psychiatry

## 2024-04-11 NOTE — Progress Notes (Signed)
 04/14/2024 Margaret Mccann [id number] 10/16/1970  Referring provider: Delbert Clam, MD Primary GI doctor: Dr. Abran  ASSESSMENT AND PLAN:  Diarrhea with AB pain x Jan, will have 10-20 Bm's a day, will drink a bottle of imodium  to help, watery stools Will have nocturnal symptoms x 2 weeks, with fecal incontinence Has lost 40 lbs 03/11/2024 CTAP W moderate hiatal hernia no colonic wall thickening no acute findings normal liver gallbladder pancreas and spleen. 12/24/2023 sed rate CRP normal negative celiac negative fecal occult blood 03/18/2024 fecal calprotectin 24, pancreatic elastase 15 Has never had colonoscopy Has had clindamycin  recently - likely pancreatic elastase is false but she does describe oily stools at times and has weight loss, will do trial of creon, get fecal fat and repeat, normal CT, if elevated fecal fat/pancreatic elastase can consider MRCP/MRI to evaluate pancrease better with history of ETOH abuse - will get Cdiff stool with recent ABX - get KUB - add on fiber, continue imodium  - will set up for colonoscopy to rule out microscopic colitis after Cdiff results - consider SIBO treatment/testing if labs are negative   Nausea and vomiting unknown etiology 2018 Status post appendectomy 9//2018 EGD Dr. Abran moderately severe reflux esophagitis ringlike stricturing, normal soft focus otherwise normal stomach normal examined duodenum placed on 40 mg twice daily pantoprazole , negative EOE or infection Continues with nausea/vomiting, sent in compazine , stop phenergan , consider repeat EGD with colon  Normocytic anemia 03/10/2024  HGB 11.7 MCV 88.4 Platelets 323 07/20/2022 B12 normal Recent Labs    12/24/23 1151 03/10/24 2047  HGB 11.1 11.7*  Check iron/ferritin  Bipolar disorder  Previous history of cocaine and amphetamine abuse  Patient Care Team: Newlin, Enobong, MD as PCP - General (Family Medicine) Thukkani, Arun K, MD as PCP - Cardiology  (Cardiology) [name], MD (Obstetrics and Gynecology)  HISTORY OF PRESENT ILLNESS: 53 y.o. female with a past medical history listed below presents for evaluation of pancreatic insufficiency.   Patient was last seen in the office 05/25/2017 by Greig Corti, PA-C for abdominal pain, nausea and vomiting.  Discussed the use of AI scribe software for clinical note transcription with the patient, who gave verbal consent to proceed.  History of Present Illness   Margaret Mccann is a 53 year old female who presents with chronic abdominal pain and diarrhea.  She has been experiencing daily diarrhea since January, which persists throughout the day unless she takes Imodium . The diarrhea varies in consistency, sometimes watery and other times thin with lipid bubbles. She has not had a normal bowel movement since January and experiences fecal incontinence. Recently, she has been waking up at night due to diarrhea, which started two weeks ago.  She experiences severe abdominal pain, nausea, and vomiting associated with the diarrhea. A pancreatic elastase test showed a level of fifteen. She has lost forty pounds over the past six months. Her stool is described as oily and shiny, with a glaze on top, and she notes black lines in the toilet.  She has been taking Imodium , which causes constipation if taken, and Phenergan  for nausea, which she finds more effective than Zofran . She recently had a tooth extraction and was prescribed Vicodin and clindamycin . She also takes Gas-X for gas relief.  She has a history of alcohol  use in the past but not currently. She eats healthfully and is concerned about her symptoms. She has not had a colonoscopy and has submitted stool samples for testing.  She  reports that she has been smoking cigarettes. She has a 1.3 pack-year smoking history. She has never used smokeless tobacco. She reports that she does not currently use alcohol . She reports that she  does not currently use drugs after having used the following drugs: Cocaine.  RELEVANT GI HISTORY, IMAGING AND LABS: Results   LABS   Pancreatic elastase: 15 g/g      CBC    Component Value Date/Time   WBC 10.1 03/10/2024 2047   RBC 3.89 03/10/2024 2047   HGB 11.7 (L) 03/10/2024 2047   HGB 11.1 12/24/2023 1151   HCT 34.4 (L) 03/10/2024 2047   HCT 32.7 (L) 12/24/2023 1151   PLT 323 03/10/2024 2047   PLT 297 12/24/2023 1151   MCV 88.4 03/10/2024 2047   MCV 89 12/24/2023 1151   MCH 30.1 03/10/2024 2047   MCHC 34.0 03/10/2024 2047   RDW 12.9 03/10/2024 2047   RDW 13.7 12/24/2023 1151   LYMPHSABS 1.8 12/24/2023 1151   MONOABS 1.0 07/15/2022 2130   EOSABS 0.1 12/24/2023 1151   BASOSABS 0.0 12/24/2023 1151   Recent Labs    12/24/23 1151 03/10/24 2047  HGB 11.1 11.7*    CMP     Component Value Date/Time   NA 129 (L) 03/10/2024 2047   NA 134 12/24/2023 1151   K 4.8 03/10/2024 2047   CL 91 (L) 03/10/2024 2047   CO2 21 (L) 03/10/2024 2047   GLUCOSE 103 (H) 03/10/2024 2047   BUN 10 03/10/2024 2047   BUN 11 12/24/2023 1151   CREATININE 0.90 03/10/2024 2047   CALCIUM  10.1 03/10/2024 2047   PROT 6.9 12/24/2023 1151   ALBUMIN 4.3 12/24/2023 1151   AST 37 12/24/2023 1151   ALT 49 (H) 12/24/2023 1151   ALKPHOS 84 12/24/2023 1151   BILITOT 0.2 12/24/2023 1151   GFRNONAA >60 03/10/2024 2047   GFRAA >60 01/29/2020 1228      Latest Ref Rng & Units 12/24/2023   11:51 AM 01/12/2023    4:07 PM 07/15/2022    9:30 PM  Hepatic Function  Total Protein 6.0 - 8.5 g/dL 6.9  6.9  6.5   Albumin 3.9 - 4.9 g/dL 4.3  4.4  3.7   AST 0 - 40 IU/L 37  24  32   ALT 0 - 32 IU/L 49  31  38   Alk Phosphatase 44 - 121 IU/L 84  97  89   Total Bilirubin 0.0 - 1.2 mg/dL 0.2  <9.7  0.4       Current Medications:    Current Outpatient Medications (Cardiovascular):    atorvastatin  (LIPITOR) 20 MG tablet, Take 1 tablet (20 mg total) by mouth at bedtime.   cloNIDine  (CATAPRES ) 0.2 MG tablet,  Take 1 tablet (0.2 mg total) by mouth at bedtime.   propranolol  (INDERAL ) 10 MG tablet, Take 1 tablet (10 mg total) by mouth 3 (three) times daily as needed (anxiety, at the onset of symptoms).   valsartan  (DIOVAN ) 80 MG tablet, Take 1 tablet (80 mg total) by mouth daily.     Current Outpatient Medications (Other):    clindamycin  (CLEOCIN ) 300 MG capsule, Take 1 capsule (300 mg total) by mouth 4 (four) times daily. X 7 days   cyclobenzaprine  (FLEXERIL ) 10 MG tablet, Take 1 tablet (10 mg total) by mouth 2 (two) times daily as needed for muscle spasms.   desvenlafaxine  (PRISTIQ ) 25 MG 24 hr tablet, Take 3 tablets (75 mg total) by mouth daily.  gabapentin  (NEURONTIN ) 600 MG tablet, Take 1 tablet (600 mg total) by mouth in the morning, at noon, in the evening, and at bedtime for 7 days.   hydrOXYzine  (ATARAX ) 50 MG tablet, Take 1 tablet (50 mg total) by mouth 3 (three) times daily as needed for anxiety.   hyoscyamine  (LEVSIN ) 0.125 MG tablet, Take 1 tablet (0.125 mg total) by mouth every 6 (six) hours as needed for cramping.   mirtazapine  (REMERON  SOL-TAB) 45 MG disintegrating tablet, Take 1 tablet (45 mg total) by mouth at bedtime.   ondansetron  (ZOFRAN ) 4 MG tablet, Take 1 tablet (4 mg total) by mouth every 8 (eight) hours as needed for nausea or vomiting.   pantoprazole  (PROTONIX ) 40 MG tablet, Take 1 tablet (40 mg total) by mouth 2 (two) times daily before a meal.   prochlorperazine  (COMPAZINE ) 5 MG tablet, Take 1 tablet (5 mg total) by mouth every 6 (six) hours as needed for nausea or vomiting.  Medical History:  Past Medical History:  Diagnosis Date   Alcohol  withdrawal seizure with complication, with unspecified complication (HCC) 12/23/2020   Anxiety    Bipolar 1 disorder (HCC)    Depression    GERD (gastroesophageal reflux disease)    HLD (hyperlipidemia)    HTN (hypertension)    Intentional drug overdose (HCC) 08/04/2020   Major depressive disorder, recurrent episode with anxious  distress (HCC) 08/11/2020   Morgellons syndrome    MRSA (methicillin resistant Staphylococcus aureus)    Suicide attempt (HCC)    Suicide by drug overdose (HCC) 08/11/2020   Tricuspid valve regurgitation 12/18/2020   Allergies:  Allergies  Allergen Reactions   Lamictal [Lamotrigine] Anaphylaxis, Rash and Other (See Comments)    Stevens-Johnson syndrome and fevers, also   Lamictal [Lamotrigine] Anaphylaxis, Rash and Other (See Comments)    Fevers and Stevens-Johnson syndrome, also   Tramadol  Other (See Comments)    Pt had a seizure after taking Tramadol !!   Erythromycin Nausea And Vomiting   Lamisil  [Terbinafine ] Nausea And Vomiting   Geodon  [Ziprasidone  Hcl] Rash   Levetiracetam Anxiety     Surgical History:  She  has a past surgical history that includes Appendectomy. Family History:  Her family history includes Breast cancer in her paternal aunt; COPD in her paternal grandfather; Diabetes in her father; Heart Problems in her mother; Heart attack (age of onset: 27) in her father; Hypertension in her paternal grandfather; Severe combined immunodeficiency in her sister; Skin cancer in her mother; Stroke in her father.  REVIEW OF SYSTEMS  : All other systems reviewed and negative except where noted in the History of Present Illness.  PHYSICAL EXAM: BP 124/80 (BP Location: Left Arm, Patient Position: Sitting, Cuff Size: Normal)   Pulse 72   Ht 5' 1 (1.549 m) Comment: height measured without shoes  Wt 136 lb 4 oz (61.8 kg)   LMP 10/27/2019   BMI 25.74 kg/m  Physical Exam   GENERAL APPEARANCE: Well nourished, in no apparent distress. HEENT: No cervical lymphadenopathy, unremarkable thyroid, sclerae anicteric, conjunctiva pink. RESPIRATORY: Respiratory effort normal, breath sounds equal bilateral without rales, rhonchi, wheezing. CARDIO: Regular rate and rhythm with no murmurs, rubs, or gallops, peripheral pulses intact. ABDOMEN: Soft, non-distended, active bowel sounds in all  four quadrants, tenderness to palpation, no rebound, no mass appreciated. RECTAL: Declines. MUSCULOSKELETAL: Full range of motion, normal gait, without edema. SKIN: Dry, intact without rashes or lesions. No jaundice. NEURO: Alert, oriented, no focal deficits. PSYCH: Cooperative, normal mood and affect.  Alan JONELLE Coombs, PA-C 9:37 AM

## 2024-04-14 ENCOUNTER — Other Ambulatory Visit (HOSPITAL_COMMUNITY): Payer: Self-pay

## 2024-04-14 ENCOUNTER — Ambulatory Visit (INDEPENDENT_AMBULATORY_CARE_PROVIDER_SITE_OTHER): Admitting: Physician Assistant

## 2024-04-14 ENCOUNTER — Other Ambulatory Visit: Payer: Self-pay

## 2024-04-14 ENCOUNTER — Encounter: Payer: Self-pay | Admitting: Physician Assistant

## 2024-04-14 ENCOUNTER — Ambulatory Visit: Payer: Self-pay | Admitting: Physician Assistant

## 2024-04-14 VITALS — BP 124/80 | HR 72 | Ht 61.0 in | Wt 136.2 lb

## 2024-04-14 DIAGNOSIS — I10 Essential (primary) hypertension: Secondary | ICD-10-CM | POA: Diagnosis not present

## 2024-04-14 DIAGNOSIS — F411 Generalized anxiety disorder: Secondary | ICD-10-CM

## 2024-04-14 DIAGNOSIS — Z87898 Personal history of other specified conditions: Secondary | ICD-10-CM

## 2024-04-14 DIAGNOSIS — E6609 Other obesity due to excess calories: Secondary | ICD-10-CM

## 2024-04-14 DIAGNOSIS — F3341 Major depressive disorder, recurrent, in partial remission: Secondary | ICD-10-CM | POA: Diagnosis not present

## 2024-04-14 DIAGNOSIS — D649 Anemia, unspecified: Secondary | ICD-10-CM

## 2024-04-14 DIAGNOSIS — Z6832 Body mass index (BMI) 32.0-32.9, adult: Secondary | ICD-10-CM

## 2024-04-14 DIAGNOSIS — E66811 Obesity, class 1: Secondary | ICD-10-CM

## 2024-04-14 DIAGNOSIS — R197 Diarrhea, unspecified: Secondary | ICD-10-CM

## 2024-04-14 MED ORDER — HYOSCYAMINE SULFATE 0.125 MG PO TABS
0.1250 mg | ORAL_TABLET | Freq: Four times a day (QID) | ORAL | 0 refills | Status: DC | PRN
  Filled 2024-04-14 (×3): qty 30, 8d supply, fill #0

## 2024-04-14 MED ORDER — PROPRANOLOL HCL 10 MG PO TABS
10.0000 mg | ORAL_TABLET | Freq: Three times a day (TID) | ORAL | 2 refills | Status: DC | PRN
  Filled 2024-04-14 – 2024-05-06 (×2): qty 90, 30d supply, fill #0
  Filled 2024-06-07: qty 90, 30d supply, fill #1

## 2024-04-14 MED ORDER — DESVENLAFAXINE SUCCINATE ER 25 MG PO TB24
75.0000 mg | ORAL_TABLET | Freq: Every day | ORAL | 2 refills | Status: DC
Start: 2024-04-14 — End: 2024-06-26
  Filled 2024-04-14 – 2024-04-17 (×3): qty 90, 30d supply, fill #0
  Filled 2024-05-06 – 2024-05-14 (×4): qty 90, 30d supply, fill #1
  Filled 2024-06-12 (×3): qty 90, 30d supply, fill #2

## 2024-04-14 MED ORDER — MIRTAZAPINE 45 MG PO TBDP
45.0000 mg | ORAL_TABLET | Freq: Every day | ORAL | 2 refills | Status: DC
  Filled 2024-04-14 – 2024-04-23 (×2): qty 30, 30d supply, fill #0
  Filled 2024-05-30 – 2024-06-02 (×2): qty 30, 30d supply, fill #1
  Filled 2024-06-25: qty 30, 30d supply, fill #2

## 2024-04-14 MED ORDER — GABAPENTIN 600 MG PO TABS
600.0000 mg | ORAL_TABLET | Freq: Four times a day (QID) | ORAL | 2 refills | Status: DC
  Filled 2024-04-14 (×2): qty 120, 30d supply, fill #0
  Filled 2024-05-12: qty 120, 30d supply, fill #1
  Filled 2024-06-09 (×2): qty 120, 30d supply, fill #2

## 2024-04-14 MED ORDER — PROCHLORPERAZINE MALEATE 5 MG PO TABS
5.0000 mg | ORAL_TABLET | Freq: Four times a day (QID) | ORAL | 0 refills | Status: DC | PRN
  Filled 2024-04-14 (×3): qty 60, 15d supply, fill #0

## 2024-04-14 MED ORDER — CLONIDINE HCL 0.2 MG PO TABS
0.2000 mg | ORAL_TABLET | Freq: Every day | ORAL | 2 refills | Status: DC
  Filled 2024-04-14 – 2024-05-02 (×3): qty 30, 30d supply, fill #0
  Filled 2024-05-30 – 2024-06-02 (×2): qty 30, 30d supply, fill #1
  Filled 2024-06-25: qty 30, 30d supply, fill #2

## 2024-04-14 MED ORDER — HYDROXYZINE HCL 50 MG PO TABS
50.0000 mg | ORAL_TABLET | Freq: Three times a day (TID) | ORAL | 2 refills | Status: DC | PRN
  Filled 2024-04-14 – 2024-04-23 (×3): qty 90, 30d supply, fill #0
  Filled 2024-05-20: qty 90, 30d supply, fill #1
  Filled 2024-06-14: qty 90, 30d supply, fill #2

## 2024-04-14 NOTE — Progress Notes (Signed)
 Noted

## 2024-04-14 NOTE — Patient Instructions (Addendum)
 Your provider has requested that you go to the basement level for lab work before leaving today. Press B on the elevator. The lab is located at the first door on the left as you exit the elevator.  Your provider has requested that you have an abdominal x ray before leaving today. Please go to the basement floor to our Radiology department for the test.   Exocrine Pancreatic Insufficiency (EPI) EPI is a condition in which your body doesn't provide enough pancreatic enzymes to properly digest your food (which can sometimes lead to some unpleasant digestive symptoms such as bloating, AB pain and loose stools, weight loss).   For many people, EPI is also a chronic lifelong condition.   That's why its so important to know what to expect with your EPI treatment, because with the right plan in place, EPI is manageable.  HOW DO I TAKE PANCREATIC ENZYMES?  Pancreatic Enzyme Replacement therapy (Creon) is only available through prescription and cannot be substituted with over the counter alternatives.   Your doctor will personalize your dose based on your weight, diet, and symptoms.  The number of capsules you take per meal will depend on your prescribed dose.  Creon must be taken DURING every meal and snack.   Whether its a full meal or a snack, take Creon every time you eat. Remember to follow your treatment plan closely and take Creon exactly as prescribed - consistency is key!!  Dose adjustments are normal with Creon.  Your doctor will start your on the dose they deem appropriate for you.   Remember to follow up with your doctor after the first two weeks to ensure your therapy is effective and you're managing your treatment appropriately.   - Drink a lot of liquids that have water , salt, and sugar. Good choices are water  mixed with juice, flavored soda, and soup broth. If you are drinking enough, your urine will be light yellow or almost clear.  - Try to eat a little food. Good choices are  potatoes, noodles, rice, oatmeal, crackers, bananas, soup, and boiled vegetables.  - Avoid high fat foods, as they can make diarrhea worse.  - Dairy products (except yogurt) may be difficult to digest when you have diarrhea. I recommend that you temporarily avoid lactose-containing foods.  - Can try loperamide  4 mg initially, then 2 mg after each unformed stool for =2 days, with a maximum of 16 mg/day.  - If loperamide  is not working, you could try bismuth salicylate (Pepto-Bismol) 30 mL or two tablets every 30 minutes for eight doses. Pepto-Bismol may make your stools black.   Go to the ER if any severe abdominal pain, fever, or weakness

## 2024-04-16 ENCOUNTER — Ambulatory Visit: Payer: Self-pay | Admitting: Internal Medicine

## 2024-04-16 ENCOUNTER — Encounter (HOSPITAL_COMMUNITY): Payer: Self-pay | Admitting: Physician Assistant

## 2024-04-16 NOTE — Progress Notes (Unsigned)
 [facility] MD/PA/NP OP Progress Note  04/14/2024 10:00 AM Margaret Mccann  MRN:  [id number]  Chief Complaint:  Chief Complaint  Patient presents with   Follow-up   Medication Refill   HPI: ***  Margaret Mccann. Margaret Mccann ***  Visit Diagnosis:    ICD-10-CM   1. Primary hypertension  I10 cloNIDine  (CATAPRES ) 0.2 MG tablet    propranolol  (INDERAL ) 10 MG tablet    2. GAD (generalized anxiety disorder)  F41.1 mirtazapine  (REMERON  SOL-TAB) 45 MG disintegrating tablet    gabapentin  (NEURONTIN ) 600 MG tablet    desvenlafaxine  (PRISTIQ ) 25 MG 24 hr tablet    hydrOXYzine  (ATARAX ) 50 MG tablet    3. Major depressive disorder, recurrent episode, in partial remission with anxious distress (HCC)  F33.41 mirtazapine  (REMERON  SOL-TAB) 45 MG disintegrating tablet    desvenlafaxine  (PRISTIQ ) 25 MG 24 hr tablet    hydrOXYzine  (ATARAX ) 50 MG tablet      Past Psychiatric History:  Major depressive disorder Anxiety PTSD Substance abuse (cocaine, alcohol )  Past Medical History:  Past Medical History:  Diagnosis Date   Alcohol  withdrawal seizure with complication, with unspecified complication (HCC) 12/23/2020   Anxiety    Bipolar 1 disorder (HCC)    Depression    GERD (gastroesophageal reflux disease)    HLD (hyperlipidemia)    HTN (hypertension)    Intentional drug overdose (HCC) 08/04/2020   Major depressive disorder, recurrent episode with anxious distress (HCC) 08/11/2020   Morgellons syndrome    MRSA (methicillin resistant Staphylococcus aureus)    Suicide attempt (HCC)    Suicide by drug overdose (HCC) 08/11/2020   Tricuspid valve regurgitation 12/18/2020    Past Surgical History:  Procedure Laterality Date   APPENDECTOMY      Family Psychiatric History:  Sister - committed suicide in 2005 by overdosing   Family History:  Family History  Problem Relation Age of Onset   Heart Problems Mother        abnormal beats   Skin cancer Mother    Heart attack Father 64    Stroke Father    Diabetes Father    Severe combined immunodeficiency Sister    Hypertension Paternal Grandfather    COPD Paternal Grandfather    Breast cancer Paternal Aunt    Ulcerative colitis Neg Hx    Esophageal cancer Neg Hx     Social History:  Social History   Socioeconomic History   Marital status: Single    Spouse name: Not on file   Number of children: 2   Years of education: Not on file   Highest education level: Bachelor's degree (e.g., BA, AB, BS)  Occupational History   Not on file  Tobacco Use   Smoking status: Some Days    Current packs/day: 0.25    Average packs/day: 0.3 packs/day for 5.0 years (1.3 ttl pk-yrs)    Types: Cigarettes   Smokeless tobacco: Never  Vaping Use   Vaping status: Never Used  Substance and Sexual Activity   Alcohol  use: Not Currently    Comment: occas   Drug use: Not Currently    Types: Cocaine    Comment: relapsed the past weekend, no cocaine use prior 6 months   Sexual activity: Not Currently    Birth control/protection: Pill  Other Topics Concern   Not on file  Social History Narrative   ** Merged History Encounter **       Social Drivers of Health   Financial Resource Strain: High Risk (03/20/2024)   Overall  Financial Resource Strain (CARDIA)    Difficulty of Paying Living Expenses: Hard  Food Insecurity: Food Insecurity Present (03/20/2024)   Hunger Vital Sign    Worried About Running Out of Food in the Last Year: Sometimes true    Ran Out of Food in the Last Year: Sometimes true  Transportation Needs: No Transportation Needs (03/20/2024)   PRAPARE - Administrator, Civil Service (Medical): No    Lack of Transportation (Non-Medical): No  Physical Activity: Insufficiently Active (03/20/2024)   Exercise Vital Sign    Days of Exercise per Week: 1 day    Minutes of Exercise per Session: 20 min  Stress: Stress Concern Present (03/20/2024)   [employer] of Occupational Health - Occupational Stress  Questionnaire    Feeling of Stress: Very much  Social Connections: Socially Isolated (03/20/2024)   Social Connection and Isolation Panel    Frequency of Communication with Friends and Family: More than three times a week    Frequency of Social Gatherings with Friends and Family: More than three times a week    Attends Religious Services: Never    Database administrator or Organizations: No    Attends Engineer, structural: Not on file    Marital Status: Divorced    Allergies:  Allergies  Allergen Reactions   Lamictal [Lamotrigine] Anaphylaxis, Rash and Other (See Comments)    Stevens-Johnson syndrome and fevers, also   Lamictal [Lamotrigine] Anaphylaxis, Rash and Other (See Comments)    Fevers and Stevens-Johnson syndrome, also   Tramadol  Other (See Comments)    Pt had a seizure after taking Tramadol !!   Erythromycin Nausea And Vomiting   Lamisil  [Terbinafine ] Nausea And Vomiting   Geodon  [Ziprasidone  Hcl] Rash   Levetiracetam Anxiety    Metabolic Disorder Labs: Lab Results  Component Value Date   HGBA1C 5.7 (H) 12/24/2023   MPG 116.89 07/20/2022   MPG 114.02 12/19/2020   No results found for: PROLACTIN Lab Results  Component Value Date   CHOL 213 (H) 12/24/2023   TRIG 134 12/24/2023   HDL 86 12/24/2023   CHOLHDL 3.4 07/20/2022   VLDL 23 07/20/2022   LDLCALC 104 (H) 12/24/2023   LDLCALC 118 (H) 07/20/2022   Lab Results  Component Value Date   TSH 3.544 07/20/2022   TSH 1.300 05/16/2021    Therapeutic Level Labs: No results found for: LITHIUM No results found for: VALPROATE No results found for: CBMZ  Current Medications: Current Outpatient Medications  Medication Sig Dispense Refill   atorvastatin  (LIPITOR) 20 MG tablet Take 1 tablet (20 mg total) by mouth at bedtime. 90 tablet 1   clindamycin  (CLEOCIN ) 300 MG capsule Take 1 capsule (300 mg total) by mouth 4 (four) times daily. X 7 days 28 capsule 0   cloNIDine  (CATAPRES ) 0.2 MG tablet Take  1 tablet (0.2 mg total) by mouth at bedtime. 30 tablet 2   cyclobenzaprine  (FLEXERIL ) 10 MG tablet Take 1 tablet (10 mg total) by mouth 2 (two) times daily as needed for muscle spasms. 60 tablet 1   desvenlafaxine  (PRISTIQ ) 25 MG 24 hr tablet Take 3 tablets (75 mg total) by mouth daily. 90 tablet 2   gabapentin  (NEURONTIN ) 600 MG tablet Take 1 tablet (600 mg total) by mouth in the morning, at noon, in the evening, and at bedtime. 120 tablet 2   hydrOXYzine  (ATARAX ) 50 MG tablet Take 1 tablet (50 mg total) by mouth 3 (three) times daily as needed for anxiety. 90 tablet 2  hyoscyamine  (LEVSIN ) 0.125 MG tablet Take 1 tablet (0.125 mg total) by mouth every 6 (six) hours as needed for cramping. 30 tablet 0   mirtazapine  (REMERON  SOL-TAB) 45 MG disintegrating tablet Take 1 tablet (45 mg total) by mouth at bedtime. 30 tablet 2   ondansetron  (ZOFRAN ) 4 MG tablet Take 1 tablet (4 mg total) by mouth every 8 (eight) hours as needed for nausea or vomiting. 20 tablet 0   pantoprazole  (PROTONIX ) 40 MG tablet Take 1 tablet (40 mg total) by mouth 2 (two) times daily before a meal. 180 tablet 1   prochlorperazine  (COMPAZINE ) 5 MG tablet Take 1 tablet (5 mg total) by mouth every 6 (six) hours as needed for nausea or vomiting. 60 tablet 0   propranolol  (INDERAL ) 10 MG tablet Take 1 tablet (10 mg total) by mouth 3 (three) times daily as needed (anxiety, at the onset of symptoms). 90 tablet 2   valsartan  (DIOVAN ) 80 MG tablet Take 1 tablet (80 mg total) by mouth daily. 90 tablet 1   No current facility-administered medications for this visit.     Musculoskeletal: Strength & Muscle Tone: within normal limits Gait & Station: normal Patient leans: N/A  Psychiatric Specialty Exam: Review of Systems  Psychiatric/Behavioral:  Positive for dysphoric mood and sleep disturbance. Negative for decreased concentration, hallucinations, self-injury and suicidal ideas. The patient is nervous/anxious. The patient is not  hyperactive.     Blood pressure (!) 139/91, pulse 75, temperature 98 F (36.7 C), temperature source Oral, height 5' 1 (1.549 m), weight 136 lb 3.2 oz (61.8 kg), last menstrual period 10/27/2019, SpO2 99%.Body mass index is 25.73 kg/m.  General Appearance: Casual  Eye Contact:  Good  Speech:  Clear and Coherent and Normal Rate  Volume:  Normal  Mood:  Anxious and Depressed  Affect:  Appropriate  Thought Process:  Coherent, Goal Directed, and Descriptions of Associations: Intact  Orientation:  Full (Time, Place, and Person)  Thought Content: WDL   Suicidal Thoughts:  No  Homicidal Thoughts:  No  Memory:  Immediate;   Good Recent;   Good Remote;   Good  Judgement:  Good  Insight:  Good  Psychomotor Activity:  Normal  Concentration:  Concentration: Good and Attention Span: Good  Recall:  Good  Fund of Knowledge: Good  Language: Good  Akathisia:  No  Handed:  Left  AIMS (if indicated): not done  Assets:  Communication Skills Desire for Improvement Financial Resources/Insurance Housing  ADL's:  Intact  Cognition: WNL  Sleep:  Fair   Screenings: AIMS    Flowsheet Row [facility]  AIMS Total Score 0   AUDIT    Flowsheet Row [facility] ([facility] ([facility]  Alcohol  Use Disorder Identification Test Final Score (AUDIT) 6 11 0   GAD-7    Flowsheet Row Office Visit from 04/14/2024 in [facility] Office Visit from 12/24/2023 in [facility] [place] [facility] Dept Of [place]. [facility] Office Visit from 06/12/2023 in [facility] [place] - A Dept Of [place]. [facility] Clinical Support from 06/04/2023 in [facility] Office Visit from 01/09/2023 in [facility] [place] - A Dept Of [place]. [facility]  Total GAD-7  Score 14 5 3 12 11    PHQ2-9    Flowsheet Row Office Visit from 04/14/2024 in [facility] Office Visit from 12/24/2023 in [facility] - A Dept Of [place]. [facility] Office Visit from 06/12/2023 in [facility] [place] - A Dept Of [place]. [facility] Clinical Support from 06/04/2023 [facility] Office Visit from 01/09/2023 in [facility] [place] - A Dept Of [place]. [facility]  PHQ-2 Total Score 3 0 0 0 0  PHQ-9 Total Score 12 6 -- 10 --   Flowsheet Row Office Visit from 04/14/2024 in [facility] ED from 03/10/2024 in [facility] at [facility] ED from 11/25/2023 in [facility] Department at [facility]  C-SSRS RISK CATEGORY Moderate Risk No Risk No Risk     Assessment and Plan: ***  ***  Collaboration of Care: Collaboration of Care: Medication Management AEB provider managing patient's psychiatric medications, Primary Care Provider AEB patient being followed by a primary care provider (internal medicine), Psychiatrist AEB patient being followed by mental health provider at this facility, Other provider involved in patient's care AEB patient being seen by gastroenterology, and Referral or follow-up with counselor/therapist AEB patient being seen by licensed clinical social worker at this facility  Patient/Guardian was advised Release of Information must be obtained prior to any record release in order to collaborate their care with an outside provider. Patient/Guardian was advised if they have not already done so to contact the registration department to sign all necessary forms in order for us  to release information regarding their care.    Consent: Patient/Guardian gives verbal consent for treatment and assignment of benefits for services provided during this visit. Patient/Guardian expressed understanding and agreed to proceed.   1. Primary hypertension  - cloNIDine  (CATAPRES ) 0.2 MG tablet; Take 1 tablet (0.2 mg total) by mouth at bedtime.  Dispense: 30 tablet; Refill: 2 - propranolol  (INDERAL ) 10 MG tablet; Take 1 tablet (10 mg total) by mouth 3 (three) times daily as needed (anxiety, at the onset of symptoms).  Dispense: 90 tablet; Refill: 2  2. GAD (generalized anxiety disorder)  - mirtazapine  (REMERON  SOL-TAB) 45 MG disintegrating tablet; Take 1 tablet (45 mg total) by mouth at bedtime.  Dispense: 30 tablet; Refill: 2 - gabapentin  (NEURONTIN ) 600 MG tablet; Take 1 tablet (600 mg total) by mouth in the morning, at noon, in the evening, and at bedtime.  Dispense: 120 tablet; Refill: 2 - desvenlafaxine  (PRISTIQ ) 25 MG 24 hr tablet; Take 3 tablets (75 mg total) by mouth daily.  Dispense: 90 tablet; Refill: 2 - hydrOXYzine  (ATARAX ) 50 MG tablet; Take 1 tablet (50 mg total) by mouth 3 (three) times daily as needed for anxiety.  Dispense: 90 tablet; Refill: 2  3. Major depressive disorder, recurrent episode, in partial remission with anxious distress (HCC)  - mirtazapine  (REMERON  SOL-TAB) 45 MG disintegrating tablet; Take 1 tablet (45 mg total) by mouth at bedtime.  Dispense: 30 tablet; Refill: 2 - desvenlafaxine  (PRISTIQ ) 25 MG 24 hr tablet; Take 3 tablets (75 mg total) by mouth daily.  Dispense: 90 tablet; Refill: 2 - hydrOXYzine  (ATARAX ) 50 MG tablet; Take 1 tablet (50 mg total) by mouth 3 (three) times daily as needed for anxiety.  Dispense: 90 tablet; Refill: 2  Patient to follow up in 6 weeks with Daniela B. Izella, MD Provider spent a total  of 27 minutes with the patient/reviewing patient's chart  Reginia FORBES Bolster, PA 04/16/2024, 7:05 PM

## 2024-04-17 ENCOUNTER — Other Ambulatory Visit (HOSPITAL_COMMUNITY): Payer: Self-pay

## 2024-04-17 ENCOUNTER — Other Ambulatory Visit: Payer: Self-pay | Admitting: Family Medicine

## 2024-04-17 ENCOUNTER — Telehealth: Payer: Self-pay | Admitting: Family Medicine

## 2024-04-17 ENCOUNTER — Other Ambulatory Visit: Payer: Self-pay

## 2024-04-17 DIAGNOSIS — R110 Nausea: Secondary | ICD-10-CM

## 2024-04-17 NOTE — Telephone Encounter (Unsigned)
 Copied from CRM [id number]. Topic: Clinical - Medication Refill >> Apr 17, 2024  2:03 PM Ivette P wrote: Medication:  Promethazine  HCl (25 mg Oral Every 8 hours PRN)  Has the patient contacted their pharmacy? Yes (Agent: If no, request that the patient contact the pharmacy for the refill. If patient does not wish to contact the pharmacy document the reason why and proceed with request.) (Agent: If yes, when and what did the pharmacy advise?)  This is the patient's preferred pharmacy:  [facility] [address] [place] [address] Phone: [phone] Fax: [phone]  Is this the correct pharmacy for this prescription? Yes If no, delete pharmacy and type the correct one.   Has the prescription been filled recently? Yes, 03/17/2024  Is the patient out of the medication? Yes  Has the patient been seen for an appointment in the last year OR does the patient have an upcoming appointment? Yes  Can we respond through MyChart? Yes  Agent: Please be advised that Rx refills may take up to 3 business days. We ask that you follow-up with your pharmacy.

## 2024-04-18 ENCOUNTER — Other Ambulatory Visit: Payer: Self-pay

## 2024-04-23 ENCOUNTER — Other Ambulatory Visit: Payer: Self-pay

## 2024-04-29 ENCOUNTER — Ambulatory Visit (HOSPITAL_COMMUNITY): Admitting: Licensed Clinical Social Worker

## 2024-05-01 ENCOUNTER — Other Ambulatory Visit: Payer: Self-pay | Admitting: Physician Assistant

## 2024-05-02 ENCOUNTER — Other Ambulatory Visit: Payer: Self-pay

## 2024-05-02 MED ORDER — HYOSCYAMINE SULFATE 0.125 MG PO TABS
0.1250 mg | ORAL_TABLET | Freq: Four times a day (QID) | ORAL | 0 refills | Status: DC | PRN
  Filled 2024-05-02 (×2): qty 30, 8d supply, fill #0

## 2024-05-05 ENCOUNTER — Other Ambulatory Visit (INDEPENDENT_AMBULATORY_CARE_PROVIDER_SITE_OTHER): Payer: Self-pay

## 2024-05-05 ENCOUNTER — Other Ambulatory Visit: Payer: Self-pay | Admitting: Physician Assistant

## 2024-05-05 ENCOUNTER — Other Ambulatory Visit: Payer: Self-pay

## 2024-05-05 DIAGNOSIS — R197 Diarrhea, unspecified: Secondary | ICD-10-CM

## 2024-05-05 DIAGNOSIS — D649 Anemia, unspecified: Secondary | ICD-10-CM

## 2024-05-05 LAB — COMPREHENSIVE METABOLIC PANEL WITH GFR
ALT: 68 U/L — ABNORMAL HIGH (ref 0–35)
AST: 35 U/L (ref 0–37)
Albumin: 4.2 g/dL (ref 3.5–5.2)
Alkaline Phosphatase: 93 U/L (ref 39–117)
BUN: 16 mg/dL (ref 6–23)
CO2: 25 meq/L (ref 19–32)
Calcium: 9.8 mg/dL (ref 8.4–10.5)
Chloride: 101 meq/L (ref 96–112)
Creatinine, Ser: 0.68 mg/dL (ref 0.40–1.20)
GFR: 101.06 mL/min (ref >60.00)
Glucose, Bld: 89 mg/dL (ref 70–99)
Potassium: 5 meq/L (ref 3.5–5.1)
Sodium: 134 meq/L — ABNORMAL LOW (ref 135–145)
Total Bilirubin: 0.4 mg/dL (ref 0.2–1.2)
Total Protein: 7.2 g/dL (ref 6.0–8.3)

## 2024-05-05 LAB — CBC WITH DIFFERENTIAL/PLATELET
Basophils Absolute: 0 K/uL (ref 0.0–0.1)
Basophils Relative: 0.6 % (ref 0.0–3.0)
Eosinophils Absolute: 0.1 K/uL (ref 0.0–0.7)
Eosinophils Relative: 1.3 % (ref 0.0–5.0)
HCT: 34.9 % — ABNORMAL LOW (ref 36.0–46.0)
Hemoglobin: 11.6 g/dL — ABNORMAL LOW (ref 12.0–15.0)
Lymphocytes Relative: 23.6 % (ref 12.0–46.0)
Lymphs Abs: 1.6 K/uL (ref 0.7–4.0)
MCHC: 33.2 g/dL (ref 30.0–36.0)
MCV: 91.2 fl (ref 78.0–100.0)
Monocytes Absolute: 0.5 K/uL (ref 0.1–1.0)
Monocytes Relative: 7.8 % (ref 3.0–12.0)
Neutro Abs: 4.6 K/uL (ref 1.4–7.7)
Neutrophils Relative %: 66.7 % (ref 43.0–77.0)
Platelets: 355 K/uL (ref 150.0–400.0)
RBC: 3.83 Mil/uL — ABNORMAL LOW (ref 3.87–5.11)
RDW: 14.1 % (ref 11.5–15.5)
WBC: 7 K/uL (ref 4.0–10.5)

## 2024-05-05 LAB — IBC + FERRITIN
Ferritin: 113.7 ng/mL (ref 10.0–291.0)
Iron: 101 ug/dL (ref 42–145)
Saturation Ratios: 23.7 % (ref 20.0–50.0)
TIBC: 425.6 ug/dL (ref 250.0–450.0)
Transferrin: 304 mg/dL (ref 212.0–360.0)

## 2024-05-05 MED ORDER — PROCHLORPERAZINE MALEATE 5 MG PO TABS
5.0000 mg | ORAL_TABLET | Freq: Four times a day (QID) | ORAL | 0 refills | Status: DC | PRN
  Filled 2024-05-05: qty 60, 15d supply, fill #0

## 2024-05-06 ENCOUNTER — Other Ambulatory Visit: Payer: Self-pay

## 2024-05-07 ENCOUNTER — Other Ambulatory Visit: Payer: Self-pay

## 2024-05-07 ENCOUNTER — Other Ambulatory Visit (HOSPITAL_COMMUNITY): Payer: Self-pay

## 2024-05-07 ENCOUNTER — Ambulatory Visit: Payer: Self-pay | Admitting: Physician Assistant

## 2024-05-07 DIAGNOSIS — A0472 Enterocolitis due to Clostridium difficile, not specified as recurrent: Secondary | ICD-10-CM

## 2024-05-07 DIAGNOSIS — R197 Diarrhea, unspecified: Secondary | ICD-10-CM

## 2024-05-08 ENCOUNTER — Other Ambulatory Visit: Payer: Self-pay

## 2024-05-08 ENCOUNTER — Other Ambulatory Visit (HOSPITAL_COMMUNITY): Payer: Self-pay

## 2024-05-08 LAB — CLOSTRIDIUM DIFFICILE TOXIN B, QUALITATIVE, REAL-TIME PCR: Toxigenic C. Difficile by PCR: DETECTED — AB

## 2024-05-08 LAB — C. DIFFICILE GDH AND TOXIN A/B
GDH ANTIGEN: DETECTED
MICRO NUMBER:: 16819905
SPECIMEN QUALITY:: ADEQUATE
TOXIN A AND B: NOT DETECTED

## 2024-05-08 MED ORDER — VANCOMYCIN HCL 125 MG PO CAPS
125.0000 mg | ORAL_CAPSULE | Freq: Four times a day (QID) | ORAL | 0 refills | Status: AC
  Filled 2024-05-08 (×4): qty 56, 14d supply, fill #0

## 2024-05-08 NOTE — Telephone Encounter (Signed)
 Patient called and stated that she was returning a call back to [place]. Patient is requesting a call back to discuss more about her results. Please advise.

## 2024-05-09 ENCOUNTER — Other Ambulatory Visit: Payer: Self-pay

## 2024-05-12 ENCOUNTER — Other Ambulatory Visit: Payer: Self-pay

## 2024-05-13 LAB — PANCREATIC ELASTASE, FECAL: Pancreatic Elastase-1, Stool: >800 ug/g (ref >200)

## 2024-05-14 ENCOUNTER — Other Ambulatory Visit: Payer: Self-pay | Admitting: Physician Assistant

## 2024-05-14 ENCOUNTER — Other Ambulatory Visit: Payer: Self-pay

## 2024-05-14 ENCOUNTER — Other Ambulatory Visit: Payer: Self-pay | Admitting: Family Medicine

## 2024-05-14 ENCOUNTER — Other Ambulatory Visit (HOSPITAL_COMMUNITY): Payer: Self-pay

## 2024-05-14 MED ORDER — HYOSCYAMINE SULFATE 0.125 MG PO TABS
0.1250 mg | ORAL_TABLET | Freq: Four times a day (QID) | ORAL | 0 refills | Status: DC | PRN
  Filled 2024-05-14: qty 30, 8d supply, fill #0

## 2024-05-14 MED ORDER — PROCHLORPERAZINE MALEATE 5 MG PO TABS
5.0000 mg | ORAL_TABLET | Freq: Four times a day (QID) | ORAL | 0 refills | Status: DC | PRN
  Filled 2024-05-14: qty 60, 15d supply, fill #0
  Filled 2024-05-14: qty 30, 8d supply, fill #0

## 2024-05-15 ENCOUNTER — Other Ambulatory Visit: Payer: Self-pay

## 2024-05-15 ENCOUNTER — Encounter: Payer: Self-pay | Admitting: Pharmacist

## 2024-05-15 ENCOUNTER — Other Ambulatory Visit (HOSPITAL_COMMUNITY): Payer: Self-pay

## 2024-05-15 LAB — FECAL FAT, QUALITATIVE
Fat Qual Neutral, Stl: NORMAL
Fat Qual Total, Stl: NORMAL

## 2024-05-15 MED ORDER — CYCLOBENZAPRINE HCL 10 MG PO TABS
10.0000 mg | ORAL_TABLET | Freq: Two times a day (BID) | ORAL | 1 refills | Status: DC | PRN
  Filled 2024-05-15 – 2024-05-29 (×5): qty 60, 30d supply, fill #0
  Filled 2024-06-18: qty 60, 30d supply, fill #1

## 2024-05-19 NOTE — Progress Notes (Deleted)
 [facility] MD/PA/NP OP Progress Note    02/28/2024 1:01 PM  Margaret Mccann  MRN:  [id number]   Assessment and Plan: Previously discussed, patient reports acute worsening of her anxiety as she and family are dealing with the resurgence of cancer and her stepfather.  Adjusted her propranolol - instead of taking her propranolol  scheduled, she will take at the onset of anxiety symptoms and is able to take a third dosage as needed. ***  # GAD (generalized anxiety disorder) - Continue Remeron  45 MG at bedtime. - Continue Atarax  50 MG 3 (three) times daily as needed for anxiety (patient taking 2 times daily on average) - Continue Pristiq  75 mg daily.  - Continue gabapentin  600 mg QID - Continue propranolol  10 MG tablets TID as needed for anxiety.  Take at the onset of anxiety symptoms rather than scheduled.  # Major depressive disorder, recurrent episode, in partial remission with anxious distress (HCC) - Remeron  as above - Pristiq  as above   # Primary hypertension Continue cloNIDine  0.2 MG at bedtime.  Increase propranolol  10 MG tablets three times daily as needed for anxiety.  Take at the onset of anxiety symptoms rather than scheduled. Lolly PCP follow-up  Chief Complaint: MM follow-up   Interval history:  Patient seen ***.  Patient reports feeling *** today. Since the previous visit, ***. Regarding medications, patient notes ***. Patient reports the following adverse effects: ***.   Patient reports *** sleep, ***. Patient reports *** appetite, ***.   Patient denies current SI, HI, and AVH. ***  Stressors include ***.   Substance use: *** Denies tobacco- 0.5 ppd cigarettes. Denies vaping. Alcohol : Last time 7-8 months ago  Illicit: Denies current cocaine use (last time was 3-4 years ago). Denies current illicit substance use.     Visit Diagnosis:  No diagnosis found.   Past Psychiatric History:  MDD , anxiety, PTSD, Substance abuse (cocaine, alcohol )  Past Medical  History:  Past Medical History:  Diagnosis Date   Alcohol  withdrawal seizure with complication, with unspecified complication (HCC) 12/23/2020   Anxiety    Bipolar 1 disorder (HCC)    Depression    GERD (gastroesophageal reflux disease)    HLD (hyperlipidemia)    HTN (hypertension)    Intentional drug overdose (HCC) 08/04/2020   Major depressive disorder, recurrent episode with anxious distress (HCC) 08/11/2020   Morgellons syndrome    MRSA (methicillin resistant Staphylococcus aureus)    Suicide attempt (HCC)    Suicide by drug overdose (HCC) 08/11/2020   Tricuspid valve regurgitation 12/18/2020    Past Surgical History:  Procedure Laterality Date   APPENDECTOMY      Family Psychiatric History: Sister - committed suicide in 2005 by overdosing.  Family History:  Family History  Problem Relation Age of Onset   Heart Problems Mother        abnormal beats   Skin cancer Mother    Heart attack Father 83   Stroke Father    Diabetes Father    Severe combined immunodeficiency Sister    Hypertension Paternal Grandfather    COPD Paternal Grandfather    Breast cancer Paternal Aunt    Ulcerative colitis Neg Hx    Esophageal cancer Neg Hx     Social History:  Social History   Socioeconomic History   Marital status: Single    Spouse name: Not on file   Number of children: 2   Years of education: Not on file   Highest education level: Bachelor's degree (e.g., BA, AB,  BS)  Occupational History   Not on file  Tobacco Use   Smoking status: Some Days    Current packs/day: 0.25    Average packs/day: 0.3 packs/day for 5.0 years (1.3 ttl pk-yrs)    Types: Cigarettes   Smokeless tobacco: Never  Vaping Use   Vaping status: Never Used  Substance and Sexual Activity   Alcohol  use: Not Currently    Comment: occas   Drug use: Not Currently    Types: Cocaine    Comment: relapsed the past weekend, no cocaine use prior 6 months   Sexual activity: Not Currently    Birth  control/protection: Pill  Other Topics Concern   Not on file  Social History Narrative   ** Merged History Encounter **       Social Drivers of Health   Financial Resource Strain: High Risk (03/20/2024)   Overall Financial Resource Strain (CARDIA)    Difficulty of Paying Living Expenses: Hard  Food Insecurity: Food Insecurity Present (03/20/2024)   Hunger Vital Sign    Worried About Running Out of Food in the Last Year: Sometimes true    Ran Out of Food in the Last Year: Sometimes true  Transportation Needs: No Transportation Needs (03/20/2024)   PRAPARE - Administrator, Civil Service (Medical): No    Lack of Transportation (Non-Medical): No  Physical Activity: Insufficiently Active (03/20/2024)   Exercise Vital Sign    Days of Exercise per Week: 1 day    Minutes of Exercise per Session: 20 min  Stress: Stress Concern Present (03/20/2024)   [employer] of Occupational Health - Occupational Stress Questionnaire    Feeling of Stress: Very much  Social Connections: Socially Isolated (03/20/2024)   Social Connection and Isolation Panel    Frequency of Communication with Friends and Family: More than three times a week    Frequency of Social Gatherings with Friends and Family: More than three times a week    Attends Religious Services: Never    Database administrator or Organizations: No    Attends Engineer, structural: Not on file    Marital Status: Divorced    Allergies:  Allergies  Allergen Reactions   Lamictal [Lamotrigine] Anaphylaxis, Rash and Other (See Comments)    Stevens-Johnson syndrome and fevers, also   Lamictal [Lamotrigine] Anaphylaxis, Rash and Other (See Comments)    Fevers and Stevens-Johnson syndrome, also   Tramadol  Other (See Comments)    Pt had a seizure after taking Tramadol !!   Erythromycin Nausea And Vomiting   Lamisil  [Terbinafine ] Nausea And Vomiting   Geodon  [Ziprasidone  Hcl] Rash   Levetiracetam Anxiety    Metabolic  Disorder Labs: Lab Results  Component Value Date   HGBA1C 5.7 (H) 12/24/2023   MPG 116.89 07/20/2022   MPG 114.02 12/19/2020   No results found for: PROLACTIN Lab Results  Component Value Date   CHOL 213 (H) 12/24/2023   TRIG 134 12/24/2023   HDL 86 12/24/2023   CHOLHDL 3.4 07/20/2022   VLDL 23 07/20/2022   LDLCALC 104 (H) 12/24/2023   LDLCALC 118 (H) 07/20/2022   Lab Results  Component Value Date   TSH 3.544 07/20/2022   TSH 1.300 05/16/2021    Therapeutic Level Labs: No results found for: LITHIUM No results found for: VALPROATE No results found for: CBMZ  Current Medications: Current Outpatient Medications  Medication Sig Dispense Refill   atorvastatin  (LIPITOR) 20 MG tablet Take 1 tablet (20 mg total) by mouth at bedtime. 90  tablet 1   clindamycin  (CLEOCIN ) 300 MG capsule Take 1 capsule (300 mg total) by mouth 4 (four) times daily. X 7 days 28 capsule 0   cloNIDine  (CATAPRES ) 0.2 MG tablet Take 1 tablet (0.2 mg total) by mouth at bedtime. 30 tablet 2   cyclobenzaprine  (FLEXERIL ) 10 MG tablet Take 1 tablet (10 mg total) by mouth 2 (two) times daily as needed for muscle spasms. 60 tablet 1   desvenlafaxine  (PRISTIQ ) 25 MG 24 hr tablet Take 3 tablets (75 mg total) by mouth daily. 90 tablet 2   gabapentin  (NEURONTIN ) 600 MG tablet Take 1 tablet (600 mg total) by mouth in the morning, at noon, in the evening, and at bedtime. 120 tablet 2   hydrOXYzine  (ATARAX ) 50 MG tablet Take 1 tablet (50 mg total) by mouth 3 (three) times daily as needed for anxiety. 90 tablet 2   hyoscyamine  (LEVSIN ) 0.125 MG tablet Take 1 tablet (0.125 mg total) by mouth every 6 (six) hours as needed for cramping. 30 tablet 0   mirtazapine  (REMERON  SOL-TAB) 45 MG disintegrating tablet Take 1 tablet (45 mg total) by mouth at bedtime. 30 tablet 2   ondansetron  (ZOFRAN ) 4 MG tablet Take 1 tablet (4 mg total) by mouth every 8 (eight) hours as needed for nausea or vomiting. 20 tablet 0   pantoprazole   (PROTONIX ) 40 MG tablet Take 1 tablet (40 mg total) by mouth 2 (two) times daily before a meal. 180 tablet 1   prochlorperazine  (COMPAZINE ) 5 MG tablet Take 1 tablet (5 mg total) by mouth every 6 (six) hours as needed for nausea or vomiting. 60 tablet 0   propranolol  (INDERAL ) 10 MG tablet Take 1 tablet (10 mg total) by mouth 3 (three) times daily as needed (anxiety, at the onset of symptoms). 90 tablet 2   valsartan  (DIOVAN ) 80 MG tablet Take 1 tablet (80 mg total) by mouth daily. 90 tablet 1   vancomycin  (VANCOCIN ) 125 MG capsule Take 1 capsule (125 mg total) by mouth 4 (four) times daily for 14 days. 56 capsule 0   No current facility-administered medications for this visit.    Objective  Psychiatric Specialty Exam: General Appearance: appears at stated age, casually dressed and groomed ***  Behavior: pleasant and cooperative ***  Psychomotor Activity: no psychomotor agitation or retardation noted ***  Eye Contact: fair *** Speech: normal amount, volume and fluency ***   Mood: euthymic *** Affect: congruent, pleasant and interactive ***  Thought Process: linear, goal directed, no circumstantial or tangential thought process noted, no racing thoughts or flight of ideas *** Descriptions of Associations: intact ***  Thought Content Hallucinations: denies AH, VH , does not appear responding to stimuli *** Delusions: no paranoia, delusions of control, grandeur, ideas of reference, thought broadcasting, and magical thinking *** Suicidal Thoughts: denies SI, intention, plan *** Homicidal Thoughts: denies HI, intention, plan ***  Alertness/Orientation: alert and fully oriented ***  Insight: fair*** Judgment: fair***  Memory: intact ***  Executive Functions  Concentration: intact *** Attention Span: fair *** Recall: intact *** Fund of Knowledge: fair ***  Physical Exam *** General: Pleasant, well-appearing ***. No acute distress. Pulmonary: Normal effort. No wheezing or  rales. Skin: No obvious rash or lesions. Neuro: A&Ox3.No focal deficit.  Review of Systems *** No reported symptoms   Screenings: AIMS    Flowsheet Row [facility]  AIMS Total Score 0   AUDIT    Flowsheet Row Ad[facility] (Dis[facility] (Dis[facility] Ad[facility] ([facility]charged[facility]  Alcohol  Use Disorder Identification Test Final Score (AUDIT) 6 11 0   GAD-7    Flowsheet Row Office Visit from 04/14/2024 in [facility] Office Visit from 12/24/2023 in [facility] [place] - A Dept Of [place]. [facility] Office Visit from 06/12/2023 in [facility] [place] - A Dept Of [place]. [facility] Clinical Support from 06/04/2023 in [facility] Office Visit from 01/09/2023 in [facility] [place] - A Dept Of [place]. [facility]  Total GAD-7 Score 14 5 3 12 11    PHQ2-9    Flowsheet Row Office Visit from 04/14/2024 in [facility] Office Visit from 12/24/2023 in [facility] - A Dept Of [place]. [facility] Office Visit from 06/12/2023 in [facility] [place] - A Dept Of [place]. [facility] Clinical Support from 06/04/2023 in [facility] Office Visit from 01/09/2023 in [facility] [place] - A Dept Of [place]. [facility]  PHQ-2 Total Score 3 0 0 0 0  PHQ-9 Total Score 12 6 -- 10 --   Flowsheet Row Office Visit from 04/14/2024 in [facility] ED from 03/10/2024 in [facility] Emergency Department at [facility] ED from 11/25/2023 in [facility] Emergency Department at  [facility]  C-SSRS RISK CATEGORY Moderate Risk No Risk No Risk    Ismael Franco, MD PGY-3 Psychiatry Resident

## 2024-05-20 ENCOUNTER — Ambulatory Visit: Payer: Self-pay | Admitting: Physician Assistant

## 2024-05-20 ENCOUNTER — Other Ambulatory Visit: Payer: Self-pay

## 2024-05-27 ENCOUNTER — Encounter (HOSPITAL_COMMUNITY): Admitting: Psychiatry

## 2024-05-27 ENCOUNTER — Other Ambulatory Visit (HOSPITAL_COMMUNITY): Payer: Self-pay

## 2024-05-29 ENCOUNTER — Other Ambulatory Visit: Payer: Self-pay

## 2024-05-30 ENCOUNTER — Other Ambulatory Visit: Payer: Self-pay

## 2024-05-30 ENCOUNTER — Other Ambulatory Visit (HOSPITAL_BASED_OUTPATIENT_CLINIC_OR_DEPARTMENT_OTHER): Payer: Self-pay

## 2024-05-30 ENCOUNTER — Other Ambulatory Visit (HOSPITAL_COMMUNITY): Payer: Self-pay

## 2024-05-30 MED ORDER — CALCIUM CARBONATE ANTACID 500 MG PO CHEW
CHEWABLE_TABLET | ORAL | 0 refills | Status: DC
  Filled 2024-05-30: qty 150, 35d supply, fill #0
  Filled 2024-05-30: qty 150, 37d supply, fill #0
  Filled 2024-06-27: qty 150, 30d supply, fill #0

## 2024-05-30 MED ORDER — BUSPIRONE HCL 5 MG PO TABS
5.0000 mg | ORAL_TABLET | Freq: Three times a day (TID) | ORAL | 0 refills | Status: DC
  Filled 2024-05-30 (×2): qty 90, 30d supply, fill #0

## 2024-05-30 MED ORDER — ONDANSETRON HCL 4 MG PO TABS
4.0000 mg | ORAL_TABLET | Freq: Three times a day (TID) | ORAL | 0 refills | Status: DC | PRN
  Filled 2024-05-30 (×2): qty 20, 7d supply, fill #0

## 2024-05-30 NOTE — Care Plan (Signed)
 Problem: PAIN - ADULT Goal: Verbalizes/displays adequate comfort level or baseline comfort level Description: INTERVENTIONS: 1. Encourage pt to monitor pain and request assistance 2. Assess pain using appropriate pain scale 3. Administer analgesics based on type and severity of pain and evaluate response 4. Implement non-pharmacological measures as appropriate and evaluate response 5. Consider cultural and social influences on pain and pain management 6. Notify LIP if interventions unsuccessful or patient reports new pain Outcome: Adequate for Discharge   Problem: INFECTION - ADULT Goal: Absence of infection during hospitalization Description: INTERVENTIONS: 1. Assess and monitor for signs and symptoms of infection 2. Monitor lab/diagnostic results 3. Monitor all insertion sites i.e., indwelling lines, tubes and drains 4. Monitor endotracheal (as able) and nasal secretions for changes in amount and color 5. Institute appropriate cooling/warming therapies per order 6. Administer medications as ordered 7. Instruct and encourage patient and family to use good hand hygiene technique 8. Identify and instruct in appropriate isolation precautions for identified infection/condition Outcome: Adequate for Discharge Goal: Absence of fever/infection during anticipated neutropenic period Description: INTERVENTIONS 1. Monitor WBC 2. Administer growth factors as ordered 3. Implement neutropenic guidelines as ordered Outcome: Adequate for Discharge   Problem: Safety - Adult Goal: Free from fall injury Description: INTERVENTIONS: 1. Assess pt frequently for physical needs 2. Identify cognitive and physical deficits and behaviors that affect risk of falls. 3. Institute fall precautions as indicated by assessment. 4. Educate pt/family on patient safety including physical limitations 5. Instruct pt to call for assistance with activity based on assessment 6. Modify environment to reduce risk of  injury 7. Consider OT/PT consult to assist with strengthening/mobility Outcome: Adequate for Discharge Goal: Absence of infection during hospitalization Description: INTERVENTIONS: 1. Assess and monitor for signs and symptoms of infection 2. Monitor lab/diagnostic results 3. Monitor all insertion sites i.e., indwelling lines, tubes and drains 4. Monitor endotracheal (as able) and nasal secretions for changes in amount and color 5. Institute appropriate cooling/warming therapies per order 6. Administer medications as ordered 7. Instruct and encourage patient and family to use good hand hygiene technique 8. Identify and instruct in appropriate isolation precautions for identified infection/condition Outcome: Adequate for Discharge   Problem: DISCHARGE PLANNING Goal: Discharge to home or other facility with appropriate resources Description: INTERVENTIONS: 1. Identify barriers to discharge w/pt and caregiver 2. Arrange for needed discharge resources and transportation as appropriate 3. Identify discharge learning needs (meds, wound care, etc) 4. Arrange for interpreters to assist at discharge as needed 5. Refer to Case Management Department for coordinating discharge planning if the patient needs post-hospital services based on physician order or complex needs related to functional status, cognitive ability or social support system Outcome: Adequate for Discharge   Problem: Chronic Conditions and Co-Morbidities Goal: Patient's chronic conditions and co-morbidity symptoms are monitored and maintained or improved Description: INTERVENTIONS: 1. Monitor and assess patient's chronic conditions and comorbid symptoms for stability, deterioration, or improvement 2. Collaborate with multidisciplinary team to address chronic and comorbid conditions and prevent exacerbation or deterioration 3. Update acute care plan with appropriate goals if chronic or comorbid symptoms are exacerbated and prevent  overall improvement and discharge Outcome: Adequate for Discharge   Problem: Health Behavior: Goal: MCB Ability to state ways to decrease the risk of falls will be met by discharge Description: Ability to state ways to decrease the risk of falls will improve by discharge Outcome: Adequate for Discharge Goal: Ability to verbalize causes of nausea has improved Description: Ability to verbalize causes of nausea has improved  Outcome: Adequate for Discharge Goal: Use of imagery and/or distraction to relieve nausea has improved Description: Use of imagery and/or distraction to relieve nausea has improved Outcome: Adequate for Discharge Goal: Knowledge of medication purpose and the side effects will be met by discharge Description: Knowledge of medication purpose and the side effects will be met by discharge Outcome: Adequate for Discharge   Problem: Safety: Goal: Will remain free from falls by discharge Description: Will remain free from falls by discharge Outcome: Adequate for Discharge   Problem: Activity: Goal: Ability to return to normal activity level will improve Description: Ability to return to normal activity level will improve Outcome: Adequate for Discharge   Problem: Bowel/Gastric: Goal: Occurrences of nausea & vomiting will decrease Description: Occurrences of nausea & vomiting will decrease Outcome: Adequate for Discharge   Problem: Fluid Volume: Goal: Maintenance of adequate hydration will improve - STG Description: Maintenance of adequate hydration will improve Outcome: Adequate for Discharge   Problem: Nutritional: Goal: Consumption of the prescribed amount of daily calories will improve Description: Consumption of the prescribed amount of daily calories will improve Outcome: Adequate for Discharge Goal: Maintenance of adequate nutrition will improve Description: Maintenance of adequate nutrition will improve Outcome: Adequate for Discharge Goal: Ability to  tolerate tube feedings without aspirating will improve Description: Ability to tolerate tube feedings without aspirating will improve Outcome: Adequate for Discharge   Problem: Physical Regulation: Goal: Diagnostic test results will improve Description: Diagnostic test results will improve Outcome: Adequate for Discharge   Problem: Safety Goal: Ability to state ways to decrease risk of aspiration will improve Description: Ability to state ways to decrease risk of aspiration will improve Outcome: Adequate for Discharge   Problem: Sensory: Goal: General experience of comfort will improve Description: General experience of comfort will improve Outcome: Adequate for Discharge

## 2024-05-30 NOTE — Discharge Summary (Signed)
 [facility] Hospitalist Discharge Summary  Identifying Information:  Margaret Mccann May 19, 1971 [id number]  Admit date: 05/24/2024  Discharge date: 05/30/2024  Discharge Service: [facility] Hospitalist  Discharge Attending Physician:Zoaib Safdar Rasool, DO  Discharge to: Home  Discharge Diagnoses: Active Problems:   Chronic diarrhea   Acute pancreatitis (CMD)   Liver enzyme elevation   Alcohol  use disorder   Anxiety and depression   Gastroesophageal reflux disease with esophagitis without hemorrhage   Chronic pain   Normocytic anemia   Elevated transaminase level   Hepatic steatosis   PTSD (post-traumatic stress disorder)   Unspecified anxiety disorder   Cocaine abuse (CMD)   Marijuana abuse   Substance-induced anxiety disorder    (CMD)    Hospital Course:  Margaret Mccann is a 53 y.o. year old female with history of HLD, HTN, a alcohol  use disorder, chronic diarrhea, chronic nausea and vomiting (since 2019), normocytic anemia who presents with intractable nausea and vomiting.     Patient presents with acute onset of vertigo (room spinning) since morning when she got up to use the restroom.  Symptoms resulted in her having intractable nausea with vomiting that are still persistent.  Reports symptoms are exacerbated with head movement.  Has associated symptoms of epigastric cramping pain, diarrhea in line with history below.   Reported being diagnosed with C. difficile (antigen positive toxin negative) reports being on oral vancomycin    Reports no heavy alcohol  use in 3 months, there was a mention that some bottles of alcohol /wine were found in her room by her mother, however patient is adamant that she has not been drinking.  No current drug use     Patient follows with GI for diarrhea, nausea vomiting.  (See note from 7/21) - Reported symptoms of 10-20 bowel movements a day, with fecal incontinence and nocturnal symptoms.  Her initial pancreatic elastase was low but repeat pancreatic elastase was  WNL, her fecal calprotectin was also WNL.  Was trialed on Creon.  Current plan is for colonoscopy.   -Her most recent C. difficile from 8/12 was antigen positive toxin negative --As far as her nausea and vomiting, part of this was attributed to reflux esophagitis based on EGD in 2018.  Currently treated symptomatically.   ED course: Temp 97.9, HR 81, RR 26, BP 130/81, SpO2 95% on room air NA 137, K4.1, CL 96, BG 113, creatinine 0.88, ALP 119, AST 109, ALT 86, mag 2.2 WBC 8.1, Hb 11, PLT 263 Lactic 0.5, lipase 422 Review of ECG shows sinus rhythm, nonspecific ST changes, QTc 462   In the ED Valium 5 Mg x 2, meclizine 25 Mg, droperidol 0.625 x 1, Compazine  10 Mg  She is on zofran  and compazine  for n/v from pancreatitis. She has chronic diarrhea and is having symptoms of vertigo     During the course of hospitalization patient was able to tolerate p.o. diet prior to discharge.  Patient's abdominal pain has resolved.  Patient's symptoms improved.  Patient did have polysubstance abuse and alcohol  abuse with UDS positive for cocaine and benzodiazepine.  Psychiatry was consulted.  Patient was extensively counseled.  Patient was started on BuSpar  for antidepressant needs.  Patient be discharged home instructed follow-up PCP in 1 week.  Again patient was extensively counseled on alcohol  cessation polysubstance abuse cessation.  Location Information: Patient State (at time of visit): [place]  Patient Location (at time of visit):Medical Facility: [facility] Provider Location: Home Is provider licensed to provide clinical care in the current location/state of the  patient? Yes   Consent:  Patient's identity was confirmed. Presenting condition or illness was discussed with the patient/personal representative. Current proposed treatment for presenting condition or illness was explained to patient/personal representative along with the likely benefits and any significant risks or  complications associated with the provision of treatment by audio/video means. The patient/personal representative verbally authorized treatment to be provided by audio/video, which may include a limited review of patient's current health status, medication, or other treatment recommendations, patient education, and an opportunity to ask questions about condition and treatment. Verbal Consent Granted by Patient/Personal Representative:Yes   Visit Information: Modality: 2-Way Real-Time Audio/Video      Post Discharge Follow Up Issues:  PCP in 1 week Take BP log at home Repeat CMP in 1 week   Procedures: None No admission procedures for hospital encounter. _____________________________________________________________________________ Discharge Day Services: BP (!) 169/97 (BP Location: Right arm, Patient Position: Lying)   Pulse 69   Temp 98.1 F (36.7 C) (Oral)   Resp 18   Ht 1.549 m (5' 0.98)   Wt 59.8 kg (131 lb 13.4 oz)   SpO2 97%   BMI 24.92 kg/m  Pt seen on the day of discharge and determined appropriate for discharge. Physical Exam GEN: NAD, white female lying in bed EYES: EOMI ENT: MMM CV: RRR PULM: CTA B ABD: soft, NT/ND, +BS EXT: No edema. No erythema  Psych:-Normal Mood, Normal affect, intact judement Neuro: AO x3 MSK: Normal ROM   Condition at Discharge: fair  Length of Discharge: I spent 39 mins in the discharge of this patient. _____________________________________________________________________________ Discharge Medications: Patient Instructions:    Discharge Medications     New Medications      Sig Disp Refill Start End  busPIRone  5 mg tablet Commonly known as: BUSPAR   Take 1 tablet (5 mg total) by mouth 3 (three) times a day.  90 tablet  0     calcium  carbonate 500 mg (200 mg calcium ) chewable tablet Commonly known as: TUMS  Chew 1 tablet (500 mg total) 4 (four) times a day as needed for indigestion or heartburn.  10 tablet  0          Medications To Continue      Sig Disp Refill Start End  atorvastatin  20 mg tablet Commonly known as: LIPITOR  Take 20 mg by mouth at bedtime.   0     cloNIDine  0.2 mg tablet Commonly known as: CATAPRES   Take 0.2 mg by mouth at bedtime.   0     cyclobenzaprine  10 mg tablet Commonly known as: FLEXERIL   Take 10 mg by mouth 2 (two) times a day as needed for muscle spasms.   0     desvenlafaxine  25 mg Tb24 24 hr tablet Commonly known as: PRISTIQ   Take 75 mg by mouth daily.   0     gabapentin  600 mg tablet Commonly known as: NEURONTIN   Take 1,200 mg by mouth 2 (two) times a day.   0     * hydrOXYzine  50 mg tablet Commonly known as: ATARAX   Take 50 mg by mouth every morning.   0     * hydrOXYzine  50 mg tablet Commonly known as: ATARAX   Take 100 mg by mouth at bedtime.   0     hyoscyamine  0.125 mg tablet Commonly known as: LEVSIN   Take 0.125 mg by mouth every 6 (six) hours as needed for cramping.   0     mirtazapine  45 mg disintegrating tablet Commonly known as:  REMERON  SOLTAB  Dissolve 45 mg on tongue at bedtime.   0     ondansetron  4 mg tablet Commonly known as: ZOFRAN   Take 1 tablet (4 mg total) by mouth every 8 (eight) hours as needed for nausea or vomiting.  20 tablet  0     pantoprazole  40 mg EC tablet Commonly known as: PROTONIX   Take 40 mg by mouth in the morning and 40 mg in the evening. Take with meals.   0     prochlorperazine  5 mg tablet Commonly known as: COMPAZINE   Take 5 mg by mouth every 6 (six) hours as needed for nausea or vomiting.   0     propranoloL  10 mg tablet Commonly known as: INDERAL   Take 10 mg by mouth 3 (three) times a day as needed.   0     valsartan  80 mg tablet Commonly known as: DIOVAN   Take 80 mg by mouth daily.   0        * * There are duplicate medications prescribed to the patient           _____________________________________________________________________________ Pending Test Results (if blank,  then none): Pending Labs     Order Current Status   Serotonin (5-Hydroxytryptamine), LC-MS/MS In process        Most Recent Labs:     Lab Results  Component Value Date   WBC 4.81 05/29/2024   HGB 10.1 (L) 05/29/2024   HCT 29.8 (L) 05/29/2024   PLT 169 05/29/2024    Lab Results  Component Value Date   CO2 26 05/30/2024   BUN 10 05/30/2024   GLUCOSE 117 (H) 05/30/2024   CREATININE 0.65 05/30/2024   CALCIUM  9.1 05/30/2024   ALBUMIN 3.2 (L) 05/26/2024   AST 127 (H) 05/26/2024   ALT 84 (H) 05/26/2024    Lab Results  Component Value Date   NA 138 05/30/2024   K 4.3 05/30/2024   CL 108 (H) 05/30/2024   CO2 26 05/30/2024   BUN 10 05/30/2024   CREATININE 0.65 05/30/2024   CALCIUM  9.1 05/30/2024   MG 1.8 (L) 05/30/2024    Lab Results  Component Value Date   BILITOT 0.5 05/26/2024   PROT 5.8 (L) 05/26/2024   ALBUMIN 3.2 (L) 05/26/2024   ALT 84 (H) 05/26/2024   AST 127 (H) 05/26/2024   GGT 142 (H) 05/25/2024    No results found for: PT, INR, APTT Hospital Radiology:  ECG 12 lead Result Date: 05/30/2024 Ventricular Rate                   73        BPM                 Atrial Rate                        73        BPM                 P-R Interval                       152       ms                  QRS Duration                       78  ms                  Q-T Interval                       400       ms                  QTC Calculation Bazett             440       ms                  Calculated P Axis                  29        degrees             Calculated R Axis                  -13       degrees             Calculated T Axis                  18        degrees             Sinus rhythm Normal ECG When compared with ECG of 24-May-2024 23 44, QRS axis Shifted right T wave inversion no longer evident in lateral leads Confirmed by Rojelio Dunnings  [phone]  on 05-30-2024 7 53 28 AM  ECG 12 lead Result Date: 05/27/2024 Ventricular Rate                   82        BPM                  QRS Duration                       80        ms                  Q-T Interval                       396       ms                  QTC Calculation Bazett             462       ms                  Calculated R Axis                  203       degrees             Calculated T Axis                  173       degrees             Sinus Rhythm Arm Lead Reversal T wave abnormality, consider inferior ischemia Prolonged QT interval, consider electrolyte imbalance or drug effects When compared with ECG of 24-May-2024 18 39, Arm Lead Reversal present Confirmed by Maralee Standing  [id number]  on 05-27-2024 10 20 53 AM  ECG 12 lead Result Date: 05/27/2024 Ventricular Rate  99        BPM                 Atrial Rate                        100       BPM                 P-R Interval                       142       ms                  QRS Duration                       80        ms                  Q-T Interval                       360       ms                  QTC Calculation Bazett             462       ms                  Calculated P Axis                  47        degrees             Calculated R Axis                  11        degrees             Calculated T Axis                  33        degrees             Sinus rhythm Normal ECG No previous ECGs available Confirmed by Rojelio Dunnings  [phone]  on 05-27-2024 9 45 20 AM  CT Abdomen Pelvis W Contrast Result Date: 05/25/2024 CT ABDOMEN PELVIS W CONTRAST, 05/25/2024 6:49 AM INDICATION: Nausea/vomiting, Pancreatitis, acute, severe \ COMPARISON: None. TECHNIQUE: CT images of the abdomen and pelvis were obtained after intravenous administration of iodinated contrast. Conventional axial reconstructions and multiplanar reformatted images were submitted for review. FINDINGS: . Lower Chest: Small sliding hiatal hernia. Dependent atelectasis. . Liver: No suspicious focal findings. Diffuse hepatic steatosis. . Gallbladder/Biliary: Unremarkable. SABRA Spleen: No acute  findings. Small accessory splenule. . Pancreas: Homogeneous parenchymal enhancement. Edematous appearance of the pancreas with mild peripancreatic stranding and thickening of the left pararenal fascia. No peripancreatic fluid collection. No focal lesion identified. No pancreatic duct dilation. . Adrenals: Unremarkable. . Kidneys: Unremarkable. . Peritoneum/Mesenteries/Extraperitoneum: No free air. No free fluid or loculated drainable collection. No pathologically enlarged lymph nodes. . Gastrointestinal tract: No evidence of obstruction. Appendectomy. . Ureters: Unremarkable. . Bladder: Unremarkable. . Reproductive System: Unremarkable. . Vascular: Unremarkable. . Musculoskeletal: No acute displaced fractures. Levocurvature of the lumbar spine centered at L3-L4. Polyarticular degenerative changes. No aggressive focal bony lesions. Abdominal wall soft tissues unremarkable.   1.  Acute interstitial pancreatitis  without evidence of peripancreatic fluid collection. 2.  Hepatic steatosis.  MRI Brain WO Contrast Result Date: 05/25/2024 MRI BRAIN WITHOUT CONTRAST, 05/24/2024 10:33 PM INDICATION: Dizziness, persistent/recurrent, cardiac or vascular cause suspected COMPARISON: None TECHNIQUE: Multiplanar, multi-sequence magnetic resonance imaging of the brain was performed without contrast. FINDINGS: Calvarium/skull base: No focal marrow replacing lesion. Orbits: No mass lesion. Paranasal sinuses: Mucous retention cyst in the left maxillary sinus. Scattered inflammatory mucosal thickening throughout the paranasal sinuses. Brain: No evidence of acute infarct. No mass effect. No evidence of acute hemorrhage. No hydrocephalus. No significant white matter disease for the patient's age. Normal appearance of the major intracranial arteries and dural venous sinuses.   No acute intracranial abnormality.   _____________________________________________________________________________ Discharge Instructions:      No  future appointments.       *Some images could not be shown.

## 2024-05-31 ENCOUNTER — Telehealth: Payer: Self-pay | Admitting: Nurse Practitioner

## 2024-05-31 NOTE — Telephone Encounter (Signed)
 Patient called the answering service this morning with complaints of severe abdominal pain.  Discharged from [facility] a few day ago after admission for acute pancreatitis, ? Etiology. She has a history of heavy alcohol  use but reportedly had no alcohol  use for the last few months . According to the discharge summary however , family member found bottles of alcohol  in her room.  Her UDS was positive for benzos, cocaine  Since hospital discharge the nausea and vomiting have been manageable with antiemetics but the abdominal pain is unbearable and she is requesting pain medication. She does confirm over the phone with me that she has not had any alcohol  in 3 months.      Patient has been recently undergoing evaluation of diarrhea by [name], PA in our office.  She was being treated with vancomycin  for presumed C. difficile.  She has a follow-up with Alan on 9/17 but requesting pain medication to tie her over until then  I told patient that she would  need to return to the ED if pain was unbearable . I asked her to call her office on Monday, perhaps there has been a cancellation and she can get a sooner appointment.

## 2024-06-02 ENCOUNTER — Ambulatory Visit (INDEPENDENT_AMBULATORY_CARE_PROVIDER_SITE_OTHER): Payer: Self-pay | Admitting: Nurse Practitioner

## 2024-06-02 ENCOUNTER — Other Ambulatory Visit: Payer: Self-pay

## 2024-06-02 ENCOUNTER — Telehealth: Payer: Self-pay | Admitting: Internal Medicine

## 2024-06-02 ENCOUNTER — Other Ambulatory Visit (INDEPENDENT_AMBULATORY_CARE_PROVIDER_SITE_OTHER): Payer: Self-pay

## 2024-06-02 ENCOUNTER — Encounter: Payer: Self-pay | Admitting: Nurse Practitioner

## 2024-06-02 VITALS — BP 126/80 | HR 72 | Ht 63.0 in | Wt 141.1 lb

## 2024-06-02 DIAGNOSIS — K529 Noninfective gastroenteritis and colitis, unspecified: Secondary | ICD-10-CM

## 2024-06-02 DIAGNOSIS — A0472 Enterocolitis due to Clostridium difficile, not specified as recurrent: Secondary | ICD-10-CM | POA: Diagnosis not present

## 2024-06-02 DIAGNOSIS — K859 Acute pancreatitis without necrosis or infection, unspecified: Secondary | ICD-10-CM

## 2024-06-02 LAB — LIPASE: Lipase: 239 U/L — ABNORMAL HIGH (ref 11.0–59.0)

## 2024-06-02 LAB — CBC WITH DIFFERENTIAL/PLATELET
Basophils Absolute: 0 K/uL (ref 0.0–0.1)
Basophils Relative: 0.7 % (ref 0.0–3.0)
Eosinophils Absolute: 0.1 K/uL (ref 0.0–0.7)
Eosinophils Relative: 1.2 % (ref 0.0–5.0)
HCT: 36 % (ref 36.0–46.0)
Hemoglobin: 11.9 g/dL — ABNORMAL LOW (ref 12.0–15.0)
Lymphocytes Relative: 25.7 % (ref 12.0–46.0)
Lymphs Abs: 1.6 K/uL (ref 0.7–4.0)
MCHC: 33 g/dL (ref 30.0–36.0)
MCV: 93.3 fl (ref 78.0–100.0)
Monocytes Absolute: 0.8 K/uL (ref 0.1–1.0)
Monocytes Relative: 13.6 % — ABNORMAL HIGH (ref 3.0–12.0)
Neutro Abs: 3.6 K/uL (ref 1.4–7.7)
Neutrophils Relative %: 58.8 % (ref 43.0–77.0)
Platelets: 396 K/uL (ref 150.0–400.0)
RBC: 3.85 Mil/uL — ABNORMAL LOW (ref 3.87–5.11)
RDW: 15.3 % (ref 11.5–15.5)
WBC: 6 K/uL (ref 4.0–10.5)

## 2024-06-02 LAB — COMPREHENSIVE METABOLIC PANEL WITH GFR
ALT: 110 U/L — ABNORMAL HIGH (ref 0–35)
AST: 60 U/L — ABNORMAL HIGH (ref 0–37)
Albumin: 4.2 g/dL (ref 3.5–5.2)
Alkaline Phosphatase: 95 U/L (ref 39–117)
BUN: 14 mg/dL (ref 6–23)
CO2: 26 meq/L (ref 19–32)
Calcium: 9.8 mg/dL (ref 8.4–10.5)
Chloride: 101 meq/L (ref 96–112)
Creatinine, Ser: 0.75 mg/dL (ref 0.40–1.20)
GFR: 92.33 mL/min (ref >60.00)
Glucose, Bld: 92 mg/dL (ref 70–99)
Potassium: 4.4 meq/L (ref 3.5–5.1)
Sodium: 134 meq/L — ABNORMAL LOW (ref 135–145)
Total Bilirubin: 0.3 mg/dL (ref 0.2–1.2)
Total Protein: 7.7 g/dL (ref 6.0–8.3)

## 2024-06-02 MED ORDER — PROCHLORPERAZINE MALEATE 5 MG PO TABS
5.0000 mg | ORAL_TABLET | Freq: Four times a day (QID) | ORAL | 0 refills | Status: DC | PRN
  Filled 2024-06-02: qty 30, 8d supply, fill #0

## 2024-06-02 MED ORDER — HYOSCYAMINE SULFATE 0.125 MG PO TABS
0.1250 mg | ORAL_TABLET | Freq: Four times a day (QID) | ORAL | 0 refills | Status: DC | PRN
  Filled 2024-06-02: qty 30, 8d supply, fill #0

## 2024-06-02 NOTE — Telephone Encounter (Signed)
 Pt states she was in the hospital recently in [place] for pancreatitis. She has been being treated with vanco for cdiff. Pt wants to know if she should continue taking the vanco if she has pancreatitis and she also is requesting pain medication. Discussed with pt that we do not reoutinely prescribe pain medication. Pt scheduled to see Elida Neas NP today at 3pm. Please see phone notre from Vina Dasen NP from 05/31/24.Pt aware of appt.

## 2024-06-02 NOTE — Progress Notes (Signed)
 06/02/2024 Margaret Mccann [id number] May 27, 1971   Chief Complaint: Diarrhea, pancreatitis   History of Present Illness: Margaret Mccann. [name] is a 53 year old female with a past medical history of hypertension, hyperlipidemia, alcohol  use disorder, polysubstance abuse and chronic diarrhea. She was last seen  in office by [name], PA-C 04/14/2024 due to having abdominal pain and diarrhea.  At that time, the patient endorsed having diarrhea since 09/2023 which recently worsened after she was prescribed Clindamycin  following a tooth extraction. Toxigenic C. difficile by PCR positive, C. difficile GDH antigen positive, C. difficile toxin A & B negative on 05/06/2024. She was prescribed a course of Vancomycin  125 mg 1 capsule 4 times daily for 14 days with Florastor 1 p.o. twice daily for 1 month.    She presented to [facility] ED 05/24/2024 with intractable nausea with vomiting and dizziness. Labs in the ED showed a WBC count of 8.1.  Hemoglobin 11.  Alk phos 119.  AST 109.  ALT 86 and lipase 422. Brain MRI was negative. CTAP showed edematous appearance of the pancreas with mild peripancreatic stranding and thickening without evidence of peripancreatic fluid collections, no focal pancreatic lesion identified and no pancreatic duct dilatation. She stated last alcohol  intake was 3 months ago. Alcohol  level was not done. She received IV fluids, antiemetic and pain medication and her symptoms stabilized. UDS was positive for cocaine and benzodiazepine. Psychiatry was consulted. Patient was extensively counseled guarding alcohol  and polysubstance cessation.  Her clinical status stabilized and she was rating p.o. diet therefore she was discharged home 05/30/2024.  He presents to our office as an urgent follow-up appointment.  She continues to have nausea, vomiting, generalized abdominal pain and diarrhea. She endorsed taking about 1 week of the vancomycin  course previously prescribed for C.  difficile then did not take any during her hospital admission or since she was discharged. She describes passing 7-8 nonbloody loose stools daily. She is no longer taking Imodium . She haves waves of generalized abdominal pain, more pronounced to the left mid abdomen. Nausea/vomiting somewhat controlled by taking Compazine  5 mg 3 times daily alternated with Zofran  twice daily. She request pain med at this time. She denies any alcohol  use for the past 3 months and no cocaine or other drug use since she was discharged home from the hospital 9/5.     Latest Ref Rng & Units 05/05/2024    2:38 PM 03/10/2024    8:47 PM 12/24/2023   11:51 AM  CBC  WBC 4.0 - 10.5 K/uL 7.0  10.1  6.6   Hemoglobin 12.0 - 15.0 g/dL 88.3  88.2  88.8   Hematocrit 36.0 - 46.0 % 34.9  34.4  32.7   Platelets 150.0 - 400.0 K/uL 355.0  323  297        Latest Ref Rng & Units 05/05/2024    2:38 PM 03/10/2024    8:47 PM 12/24/2023   11:51 AM  CMP  Glucose 70 - 99 mg/dL 89  896  81   BUN 6 - 23 mg/dL 16  10  11    Creatinine 0.40 - 1.20 mg/dL 9.31  9.09  9.09   Sodium 135 - 145 mEq/L 134  129  134   Potassium 3.5 - 5.1 mEq/L 5.0  4.8  4.5   Chloride 96 - 112 mEq/L 101  91  99   CO2 19 - 32 mEq/L 25  21  16    Calcium  8.4 - 10.5 mg/dL 9.8  89.8  9.8   Total Protein 6.0 - 8.3 g/dL 7.2   6.9   Total Bilirubin 0.2 - 1.2 mg/dL 0.4   0.2   Alkaline Phos 39 - 117 U/L 93   84   AST 0 - 37 U/L 35   37   ALT 0 - 35 U/L 68   49     Fecal calprotectin level 24 and pancreatic elastase level 15 on 03/18/2024 Pancreatic elastase level > 800 on 05/06/2024 Negative celiac panel  on 12/24/2023  Labs 05/24/2024: WBC 8.08.  Hemoglobin 11.0.  Hematocrit 32.4.  Platelets 263.  Sodium 137.  Potassium 4.1.  BUN 13.  Creatinine 0.88.  Total bili 0.9.  Alk phos 119.  AST 109.  ALT 86.  Lipase 422.  Labs 05/25/2024: UDS positive for benzodiazepines, cocaine and marijuana/THC  Labs 05/28/2024: Hemoglobin 10.3.  Hematocrit 30.9.  Labs 05/30/2024: BUN 10.   Creatinine 0.65.     Latest Ref Rng & Units 05/05/2024    2:38 PM 03/10/2024    8:47 PM 12/24/2023   11:51 AM  CBC  WBC 4.0 - 10.5 K/uL 7.0  10.1  6.6   Hemoglobin 12.0 - 15.0 g/dL 88.3  88.2  88.8   Hematocrit 36.0 - 46.0 % 34.9  34.4  32.7   Platelets 150.0 - 400.0 K/uL 355.0  323  297      IMAGE STUDIES:  CTAP with contrast 05/25/2024:  SABRA Lower Chest: Small sliding hiatal hernia. Dependent atelectasis.   . Liver: No suspicious focal findings. Diffuse hepatic steatosis.  . Gallbladder/Biliary: Unremarkable.  SABRA Spleen: No acute findings. Small accessory splenule.  . Pancreas: Homogeneous parenchymal enhancement. Edematous appearance of the pancreas with mild peripancreatic stranding and thickening of the left pararenal fascia. No peripancreatic fluid collection. No focal lesion identified. No pancreatic duct dilation.  . Adrenals: Unremarkable.  . Kidneys: Unremarkable.   . Peritoneum/Mesenteries/Extraperitoneum: No free air. No free fluid or loculated drainable collection. No pathologically enlarged lymph nodes.  . Gastrointestinal tract: No evidence of obstruction. Appendectomy.   . Ureters: Unremarkable.  . Bladder: Unremarkable.  . Reproductive System: Unremarkable.   . Vascular: Unremarkable.  . Musculoskeletal: No acute displaced fractures. Levocurvature of the lumbar spine centered at L3-L4. Polyarticular degenerative changes. No aggressive focal bony lesions. Abdominal wall soft tissues unremarkable.   Current Outpatient Medications on File Prior to Visit  Medication Sig Dispense Refill   atorvastatin  (LIPITOR) 20 MG tablet Take 1 tablet (20 mg total) by mouth at bedtime. 90 tablet 1   busPIRone  (BUSPAR ) 5 MG tablet Take 1 tablet (5 mg total) by mouth 3 (three) times daily. 90 tablet 0   cloNIDine  (CATAPRES ) 0.2 MG tablet Take 1 tablet (0.2 mg total) by mouth at bedtime. 30 tablet 2   cyclobenzaprine  (FLEXERIL ) 10 MG tablet Take 1 tablet (10 mg total) by mouth 2 (two)  times daily as needed for muscle spasms. 60 tablet 1   desvenlafaxine  (PRISTIQ ) 25 MG 24 hr tablet Take 3 tablets (75 mg total) by mouth daily. 90 tablet 2   gabapentin  (NEURONTIN ) 600 MG tablet Take 1 tablet (600 mg total) by mouth in the morning, at noon, in the evening, and at bedtime. 120 tablet 2   hydrOXYzine  (ATARAX ) 50 MG tablet Take 1 tablet (50 mg total) by mouth 3 (three) times daily as needed for anxiety. 90 tablet 2   mirtazapine  (REMERON  SOL-TAB) 45 MG disintegrating tablet Take 1 tablet (45 mg total) by mouth at bedtime. 30 tablet 2  ondansetron  (ZOFRAN ) 4 MG tablet Take 1 tablet (4 mg total) by mouth every 8 (eight) hours as needed for nausea or vomiting. 20 tablet 0   pantoprazole  (PROTONIX ) 40 MG tablet Take 1 tablet (40 mg total) by mouth 2 (two) times daily before a meal. 180 tablet 1   propranolol  (INDERAL ) 10 MG tablet Take 1 tablet (10 mg total) by mouth 3 (three) times daily as needed (anxiety, at the onset of symptoms). 90 tablet 2   valsartan  (DIOVAN ) 80 MG tablet Take 1 tablet (80 mg total) by mouth daily. 90 tablet 1   calcium  carbonate (ANTACID) 500 MG chewable tablet Chew 1 tablet by mouth 4 times daily as needed for indigestion/heartburn. (Patient not taking: Reported on 06/02/2024) 150 tablet 0   clindamycin  (CLEOCIN ) 300 MG capsule Take 1 capsule (300 mg total) by mouth 4 (four) times daily. X 7 days (Patient not taking: Reported on 06/02/2024) 28 capsule 0   No current facility-administered medications on file prior to visit.   Allergies  Allergen Reactions   Lamictal [Lamotrigine] Anaphylaxis, Rash and Other (See Comments)    Stevens-Johnson syndrome and fevers, also   Lamictal [Lamotrigine] Anaphylaxis, Rash and Other (See Comments)    Fevers and Stevens-Johnson syndrome, also   Tramadol  Other (See Comments)    Pt had a seizure after taking Tramadol !!   Erythromycin Nausea And Vomiting   Lamisil  [Terbinafine ] Nausea And Vomiting   Geodon  [Ziprasidone  Hcl] Rash    Levetiracetam Anxiety   Current Medications, Allergies, Past Medical History, Past Surgical History, Family History and Social History were reviewed in [employer] record.  Review of Systems:   Constitutional: Negative for fever, sweats, chills or weight loss.  Respiratory: Negative for shortness of breath.   Cardiovascular: Negative for chest pain, palpitations and leg swelling.  Gastrointestinal: See HPI.  Musculoskeletal: Negative for back pain or muscle aches.  Neurological: + Dizziness.   Physical Exam: BP 126/80   Pulse 72   Ht 5' 3 (1.6 m)   Wt 141 lb 2 oz (64 kg)   LMP 10/27/2019   BMI 25.00 kg/m  General: Fatigued appearing 53 year old female in no acute distress. Head: Normocephalic and atraumatic. Eyes: No scleral icterus. Conjunctiva pink . Ears: Normal auditory acuity. Mouth: Dentition intact. No ulcers or lesions.  Lungs: Clear throughout to auscultation. Heart: Regular rate and rhythm, no murmur. Abdomen: Soft, nondistended. Mild generalized tenderness without rebound or guarding. No masses or hepatomegaly. Normal bowel sounds x 4 quadrants.  Rectal: Deferred. Musculoskeletal: Symmetrical with no gross deformities. Extremities: No edema. Neurological: Alert oriented x 4. No focal deficits.  Psychological: Alert and cooperative. Normal mood and affect  Assessment and Recommendations:  53 year old female admitted to [facility] 8/30 - 05/30/2024 with N/V/D and abdominal pain, diagnosed with acute pancreatitis. CTAP showed edematous appearance of the pancreas with mild peripancreatic stranding and thickening without evidence of peripancreatic fluid collections, no focal pancreatic lesion identified and no pancreatic duct dilatation. Alk phos 119. AST 109. ALT 86 and lipase 422.  Abdominal exam today without alarm signs, abdomen was soft, nondistended with very mild tenderness without rebound or guarding. - CBC, CMP and lipase  level - Soft diet as tolerated, Ensure 1 can twice daily - Compazine  5 mg 1 tab p.o. every 6 hours as needed - Ondansetron  4 mg p.o. twice daily as needed - Hyoscyamine  0.125 mg 1 tab sublingual 3 times daily as needed abdominal pain - Recommend avoiding narcotics - Patient counseled complete alcohol   abstinence imperative - Patient instructed to go to the emergency room if she develops severe abdominal pain  Chronic diarrhea, recently worsened after taking a course of Clindamycin . Toxigenic C. difficile by PCR positive, C. difficile GDH antigen positive, C. difficile toxin A & B negative on 05/06/2024. She was prescribed a course of Vancomycin  125 mg 1 capsule 4 times daily for 14 days with Florastor 1 p.o. twice daily for 1 month. Patient completed 1 week of Vancomycin , then stopped taking it when she was admitted to the hospital 8/30.  She continues to have 7-8 nonbloody loose stools daily. - Patient instructed to complete her previously prescribed course of Vancomycin  125 mg 1 4 times daily for total of 14 days and Florastor 1 p.o. twice daily for 1 month.  - Eventual diagnostic colonoscopy to rule out IBD/microscopic colitis after medical status stable  Polysubstance abuse - Patient strongly encouraged to remain 100% abstinent from alcohol  and illicit drugs, outpatient rehab recommended  Hepatic steatosis per CTAP - Repeat LFTs as ordered above   Further GI follow-up to be determined after the above lab results received

## 2024-06-02 NOTE — Patient Instructions (Addendum)
 Your provider has requested that you go to the basement level for lab work before leaving today. Press B on the elevator. The lab is located at the first door on the left as you exit the elevator.  Due to recent changes in healthcare laws, you may see the results of your imaging and laboratory studies on MyChart before your provider has had a chance to review them.  We understand that in some cases there may be results that are confusing or concerning to you. Not all laboratory results come back in the same time frame and the provider may be waiting for multiple results in order to interpret others.  Please give us  48 hours in order for your provider to thoroughly review all the results before contacting the office for clarification of your results.    Complete prior course of vancomycin  and florastor.  Increase Ensure to 2 cans daily.  No alcohol  or drug use.  Go to emergency room if you develop any new symptoms or if your pain and/ or diarrhea worsens.  Thank you for trusting me with your gastrointestinal care!   Elida Shawl, CRNP   _______________________________________________________  If your blood pressure at your visit was 140/90 or greater, please contact your primary care physician to follow up on this.  _______________________________________________________  If you are age 88 or older, your body mass index should be between 23-30. Your Body mass index is 25 kg/m. If this is out of the aforementioned range listed, please consider follow up with your Primary Care Provider.  If you are age 81 or younger, your body mass index should be between 19-25. Your Body mass index is 25 kg/m. If this is out of the aformentioned range listed, please consider follow up with your Primary Care Provider.   ________________________________________________________  The [place] [facility] providers would like to encourage you to use MYCHART to communicate with providers for non-urgent  requests or questions.  Due to long hold times on the telephone, sending your provider a message by [facility] may be a faster and more efficient way to get a response.  Please allow 48 business hours for a response.  Please remember that this is for non-urgent requests.  _______________________________________________________  [facility] is using a team-based approach to care.  Your team is made up of your doctor and two to three APPS. Our APPS (Nurse Practitioners and Physician Assistants) work with your physician to ensure care continuity for you. They are fully qualified to address your health concerns and develop a treatment plan. They communicate directly with your gastroenterologist to care for you. Seeing the Advanced Practice Practitioners on your physician's team can help you by facilitating care more promptly, often allowing for earlier appointments, access to diagnostic testing, procedures, and other specialty referrals.

## 2024-06-02 NOTE — Telephone Encounter (Signed)
 Received a call from patient regarding Hospital fu. States she had cat scan and found severe pancreatis, has been taking pain medications like tramadol  and have no help. Is requesting fu call to discuss better solutions for medication and treatment like Vicodin or percocet. Please review and advise   Thank you

## 2024-06-03 ENCOUNTER — Other Ambulatory Visit: Payer: Self-pay

## 2024-06-03 NOTE — Progress Notes (Signed)
 Noted

## 2024-06-04 ENCOUNTER — Ambulatory Visit: Payer: Self-pay | Admitting: Nurse Practitioner

## 2024-06-04 DIAGNOSIS — D649 Anemia, unspecified: Secondary | ICD-10-CM

## 2024-06-04 DIAGNOSIS — K859 Acute pancreatitis without necrosis or infection, unspecified: Secondary | ICD-10-CM

## 2024-06-06 ENCOUNTER — Telehealth: Payer: Self-pay | Admitting: Nurse Practitioner

## 2024-06-06 ENCOUNTER — Other Ambulatory Visit: Payer: Self-pay

## 2024-06-06 NOTE — Telephone Encounter (Signed)
 Inbound call from patient requesting a call back to discuss if she can have a prescription of phenergan  sent to the neighborhood [place] on [address] in [facility]. Please advise.

## 2024-06-06 NOTE — Telephone Encounter (Signed)
 Demita, I discussed with patient at the time of her office visit that I would not prescribe narcotics in setting of alcohol  and drug abuse, deferred pain pain management to her PCP. I did prescribe Hyoscyamine . Patient is taking numerous medications, Compazine  and Zofran  for nausea. I do not wish to add Phenergan  which may result in drowsiness. Patient to go to the ED if she has severe N/V and/or abdominal pain.

## 2024-06-06 NOTE — Telephone Encounter (Signed)
 Elida, we saw this patient in office 9/08.  That day she called before her appointment and requested Vicodin or percocet to be sent in.  After her visit you sent in Compazine  5 mg and Hyoscyamine  0.125 mg.  She is calling today for phenergan .  Do you want to send in this medication?

## 2024-06-07 ENCOUNTER — Other Ambulatory Visit: Payer: Self-pay

## 2024-06-09 ENCOUNTER — Other Ambulatory Visit: Payer: Self-pay

## 2024-06-09 ENCOUNTER — Other Ambulatory Visit: Payer: Self-pay | Admitting: Family Medicine

## 2024-06-09 ENCOUNTER — Encounter: Payer: Self-pay | Admitting: Family Medicine

## 2024-06-09 DIAGNOSIS — R110 Nausea: Secondary | ICD-10-CM

## 2024-06-09 NOTE — Telephone Encounter (Signed)
 I spoke to Margaret Mccann and I advised her that Margaret Mccann denied the request for a new medication.  I explained that as Margaret Mccann discussed with her at her visit, if her symptoms are getting worse and her medications are not helping, she should go to the ER.  Patient stated that she understood and thanked us  for returning her call.

## 2024-06-10 ENCOUNTER — Ambulatory Visit: Payer: Self-pay

## 2024-06-10 ENCOUNTER — Telehealth: Payer: Self-pay | Admitting: Nurse Practitioner

## 2024-06-10 ENCOUNTER — Other Ambulatory Visit (HOSPITAL_COMMUNITY): Payer: Self-pay

## 2024-06-10 ENCOUNTER — Other Ambulatory Visit: Payer: Self-pay | Admitting: Family Medicine

## 2024-06-10 ENCOUNTER — Ambulatory Visit: Payer: Self-pay | Admitting: Internal Medicine

## 2024-06-10 ENCOUNTER — Other Ambulatory Visit: Payer: Self-pay

## 2024-06-10 ENCOUNTER — Other Ambulatory Visit (INDEPENDENT_AMBULATORY_CARE_PROVIDER_SITE_OTHER): Payer: Self-pay

## 2024-06-10 DIAGNOSIS — K859 Acute pancreatitis without necrosis or infection, unspecified: Secondary | ICD-10-CM

## 2024-06-10 DIAGNOSIS — D649 Anemia, unspecified: Secondary | ICD-10-CM | POA: Diagnosis not present

## 2024-06-10 LAB — HEPATIC FUNCTION PANEL
ALT: 102 U/L — ABNORMAL HIGH (ref 0–35)
AST: 41 U/L — ABNORMAL HIGH (ref 0–37)
Albumin: 4.4 g/dL (ref 3.5–5.2)
Alkaline Phosphatase: 86 U/L (ref 39–117)
Bilirubin, Direct: 0.1 mg/dL (ref 0.0–0.3)
Total Bilirubin: 0.2 mg/dL (ref 0.2–1.2)
Total Protein: 7 g/dL (ref 6.0–8.3)

## 2024-06-10 LAB — LIPASE: Lipase: 224 U/L — ABNORMAL HIGH (ref 11.0–59.0)

## 2024-06-10 MED ORDER — PROMETHAZINE HCL 25 MG PO TABS
25.0000 mg | ORAL_TABLET | Freq: Three times a day (TID) | ORAL | 1 refills | Status: DC | PRN
Start: 2024-06-10 — End: 2024-06-24
  Filled 2024-06-10 (×2): qty 60, 20d supply, fill #0
  Filled 2024-06-18 – 2024-06-19 (×2): qty 60, 20d supply, fill #1

## 2024-06-10 NOTE — Telephone Encounter (Signed)
 Inbound call from patient stating she was on medication compazine  but stopped taking it since it was giving her headaches would like new medication for nausea. Please advise  Thank you

## 2024-06-10 NOTE — Telephone Encounter (Signed)
 See note below and advise.

## 2024-06-10 NOTE — Telephone Encounter (Signed)
 Please see providers response in patient mychart message.

## 2024-06-10 NOTE — Telephone Encounter (Signed)
 FYI Only or Action Required?: Action required by provider: clinical question for provider.  Patient was last seen in primary care on 03/24/2024 by Newlin, Enobong, MD.  Called Nurse Triage reporting Advice Only.  Symptoms began x several weeks and unchanged.  Interventions attempted: Nothing.  Symptoms are: gradually worsening.  Triage Disposition: Information or Advice Only Call  Patient/caregiver understands and will follow disposition?: Yes     Copied from CRM [id number]. Topic: Clinical - Red Word Triage >> Jun 10, 2024  9:41 AM Deleta RAMAN wrote: Red Word that prompted transfer to Nurse Triage: patient sent message to pcp regarding medication mentioned he is not currently taking compazine . States she is very sick and needs medication today. Has been experiencing headaches as well   Reason for Disposition  Health information question, no triage required and triager able to answer question    Routing to PCP  Answer Assessment - Initial Assessment Questions 1. REASON FOR CALL: What is the main reason for your call? or How can I best help you?  Pt stated recently had pancreatitis given compazine  for nausea.  Pt stated had to discontinue the compazine  because it has been giving her headaches ever since began taking.  Pt would like to request to go back on phenergan .  Please send to :  [facility] - pharmacy  Protocols used: Information Only Call - No Triage-A-AH

## 2024-06-11 ENCOUNTER — Other Ambulatory Visit: Payer: Self-pay | Admitting: Family Medicine

## 2024-06-11 ENCOUNTER — Other Ambulatory Visit (HOSPITAL_COMMUNITY): Payer: Self-pay

## 2024-06-11 ENCOUNTER — Ambulatory Visit: Payer: Self-pay | Admitting: Physician Assistant

## 2024-06-11 ENCOUNTER — Other Ambulatory Visit: Payer: Self-pay

## 2024-06-11 MED ORDER — VALSARTAN 80 MG PO TABS
80.0000 mg | ORAL_TABLET | Freq: Every day | ORAL | 0 refills | Status: DC
  Filled 2024-06-11 – 2024-06-12 (×2): qty 30, 30d supply, fill #0

## 2024-06-11 NOTE — Telephone Encounter (Signed)
 Rock, patient to stop Compazine . Ok to send in RX for Promethazine  ( Phenergan ) 12.5 mg one tab po bid PRN NV # 30, no refills. THX.

## 2024-06-12 ENCOUNTER — Other Ambulatory Visit: Payer: Self-pay

## 2024-06-12 ENCOUNTER — Other Ambulatory Visit (HOSPITAL_COMMUNITY): Payer: Self-pay

## 2024-06-12 NOTE — Telephone Encounter (Signed)
 Pt notified via mychart:  Elida Nyle Sharps NP stated you could stop the compazine  and she was going to prescribe phenergan  12.5mg  tabs for you to take 1 twice a day as needed. Upon review of your chart there has already been a prescription filled for phenergan  25mg  on 9/16. You can continue to take that prescription.

## 2024-06-14 ENCOUNTER — Other Ambulatory Visit (HOSPITAL_COMMUNITY): Payer: Self-pay

## 2024-06-14 ENCOUNTER — Other Ambulatory Visit: Payer: Self-pay | Admitting: Nurse Practitioner

## 2024-06-16 ENCOUNTER — Other Ambulatory Visit (HOSPITAL_COMMUNITY): Payer: Self-pay

## 2024-06-16 ENCOUNTER — Other Ambulatory Visit: Payer: Self-pay

## 2024-06-16 ENCOUNTER — Other Ambulatory Visit: Payer: Self-pay | Admitting: Nurse Practitioner

## 2024-06-16 MED ORDER — HYOSCYAMINE SULFATE 0.125 MG PO TABS
0.1250 mg | ORAL_TABLET | Freq: Four times a day (QID) | ORAL | 0 refills | Status: DC | PRN
  Filled 2024-06-16: qty 30, 8d supply, fill #0

## 2024-06-17 ENCOUNTER — Other Ambulatory Visit: Payer: Self-pay

## 2024-06-17 ENCOUNTER — Encounter (HOSPITAL_COMMUNITY): Payer: Self-pay

## 2024-06-17 ENCOUNTER — Other Ambulatory Visit (HOSPITAL_BASED_OUTPATIENT_CLINIC_OR_DEPARTMENT_OTHER): Payer: Self-pay

## 2024-06-17 ENCOUNTER — Other Ambulatory Visit (HOSPITAL_COMMUNITY): Payer: Self-pay

## 2024-06-17 MED ORDER — HYOSCYAMINE SULFATE 0.125 MG PO TABS
0.1250 mg | ORAL_TABLET | Freq: Four times a day (QID) | ORAL | 3 refills | Status: DC | PRN
  Filled 2024-06-17 (×2): qty 30, 8d supply, fill #0
  Filled 2024-06-23: qty 30, 8d supply, fill #1
  Filled 2024-07-03: qty 30, 8d supply, fill #2
  Filled 2024-07-17 – 2024-07-31 (×2): qty 30, 8d supply, fill #3

## 2024-06-17 MED ORDER — PROCHLORPERAZINE MALEATE 5 MG PO TABS
5.0000 mg | ORAL_TABLET | Freq: Four times a day (QID) | ORAL | 3 refills | Status: DC | PRN
  Filled 2024-06-17 (×2): qty 30, 8d supply, fill #0
  Filled 2024-06-23: qty 30, 8d supply, fill #1

## 2024-06-17 NOTE — Progress Notes (Signed)
 [facility] MD/PA/NP OP Progress Note   Margaret Mccann  MRN:  [id number]   Assessment and Plan: In the prior visit, propranolol  was adjusted- instead of taking her propranolol  scheduled, she will take at the onset of anxiety symptoms and is able to take a third dosage as needed. Patient has high anxiety and limited insight and judgement regarding her ability to emotionally regulate. She does not a therapist, good greatly benefit from this so referral has been placed today. Plan has also continuously been requesting for benzodiazepines even after extensive discussion of the risks. I discussed that we are not starting benzodiazepines. She was started on BuSpar  in the hospital and he is wanting to remain on the medication. Shared decision making completed and she will be tapering off the propranolol . Plan moving forward is to de-escalate her medications as she is currently on multiple anxiolytic medication with seemingly low benefit. Will likely decrease Remeron  in the future. F/u in one month.  # GAD (generalized anxiety disorder) - Start therapy - Continue Pristiq  75 mg daily - Continue Remeron  45 mg at bedtime - Continue gabapentin  600 mg four times daily - Continue Buspar  5 mg TID - Continue Atarax  50 MG TID PRN - Decrease propranolol  10 MG tablets TID as needed for anxiety.  Take at the onset of anxiety symptoms rather than scheduled -> BID PRN for 7 days-> daily PRN for 14 days -> stop   # Major depressive disorder, recurrent episode, in partial remission with anxious distress (HCC) - Remeron  as above - Pristiq  as above   # Primary hypertension Continue cloNIDine  0.2 MG at bedtime (states this is also for PTSD) Propranolol  10 MG tablets three times daily as needed for anxiety.  Take at the onset of anxiety symptoms rather than scheduled. Lolly PCP follow-up  Chief Complaint: MM follow-up   Interval history:  Patient seen alone.  Patient reports feeling anxious today. Since the previous  visit, she states that she had pancreatitis and dizziness. She also notes her step father having bladder cancer having mets a few days ago.  She notes since increasing propranolol .  She is currently in a 12 step program. She is stating she needs a benzodiazepine. I discussed that a benzodiazepine would not be indicated at this time considering the abundance in adverse effects. She states that she was started on BuSpar  4 weeks ago during her hospitalization and she states it was partially effective. She notes using breathing, meditation (stating she goes through [facility]), and taking warm baths. She states I was doing magically until I found out that my dad has cancer. She also states I have been asking and asking for over a year to be put on a benzo and nobody is listening to me.  Patient reports varied sleep, reporting 4-8 hours of sleep. Patient reports poor appetite attributing this to the recent pancreatitis.   Patient denies current SI, HI, and AVH.   Substance use:  Currently smoking 0.5 pack daily Alcohol : most recently 04/2023 Illicit: Denies current cocaine use (last time was 3-4 years ago). Denies current illicit substance use.    Visit Diagnosis:    ICD-10-CM   1. GAD (generalized anxiety disorder)  F41.1     2. MDD (major depressive disorder), recurrent severe, without psychosis (HCC)  F33.2     3. PTSD (post-traumatic stress disorder)  F43.10        Past Psychiatric History:  MDD , anxiety, PTSD, Substance abuse (cocaine, alcohol )  Family Psychiatric History: Sister - committed  suicide in 2005 by overdosing.  Social History: Living: living alone Occupation: denies Children: yes, one daughter and one son Support: sponsor and group   Past Medical History:  Past Medical History:  Diagnosis Date   Alcohol  withdrawal seizure with complication, with unspecified complication (HCC) 12/23/2020   Anxiety    Bipolar 1 disorder (HCC)    C. difficile colitis     Depression    GERD (gastroesophageal reflux disease)    HLD (hyperlipidemia)    HTN (hypertension)    Intentional drug overdose (HCC) 08/04/2020   Major depressive disorder, recurrent episode with anxious distress 08/11/2020   Morgellons syndrome    MRSA (methicillin resistant Staphylococcus aureus)    Suicide attempt (HCC)    Suicide by drug overdose (HCC) 08/11/2020   Tricuspid valve regurgitation 12/18/2020    Past Surgical History:  Procedure Laterality Date   APPENDECTOMY      Family History:  Family History  Problem Relation Age of Onset   Heart Problems Mother        abnormal beats   Skin cancer Mother    Heart attack Father 67   Stroke Father    Diabetes Father    Severe combined immunodeficiency Sister    Hypertension Paternal Grandfather    COPD Paternal Grandfather    Breast cancer Paternal Aunt    Ulcerative colitis Neg Hx    Esophageal cancer Neg Hx     Social History:  Social History   Socioeconomic History   Marital status: Single    Spouse name: Not on file   Number of children: 2   Years of education: Not on file   Highest education level: Bachelor's degree (e.g., BA, AB, BS)  Occupational History   Not on file  Tobacco Use   Smoking status: Some Days    Current packs/day: 0.25    Average packs/day: 0.3 packs/day for 5.0 years (1.3 ttl pk-yrs)    Types: Cigarettes   Smokeless tobacco: Never  Vaping Use   Vaping status: Never Used  Substance and Sexual Activity   Alcohol  use: Not Currently    Comment: occas   Drug use: Not Currently    Types: Cocaine    Comment: relapsed the past weekend, no cocaine use prior 6 months   Sexual activity: Not Currently    Birth control/protection: Pill  Other Topics Concern   Not on file  Social History Narrative   ** Merged History Encounter **       Social Drivers of Health   Financial Resource Strain: High Risk (03/20/2024)   Overall Financial Resource Strain (CARDIA)    Difficulty of Paying  Living Expenses: Hard  Food Insecurity: Low Risk  (05/25/2024)   Received from [facility]   Hunger Vital Sign    Within the past 12 months, you worried that your food would run out before you got money to buy more: Never true    Within the past 12 months, the food you bought just didn't last and you didn't have money to get more. : Never true  Recent Concern: Food Insecurity - Food Insecurity Present (03/20/2024)   Hunger Vital Sign    Worried About Running Out of Food in the Last Year: Sometimes true    Ran Out of Food in the Last Year: Sometimes true  Transportation Needs: No Transportation Needs (05/25/2024)   Received from [facility]    In the past 12 months, has lack of reliable transportation kept you from  medical appointments, meetings, work or from getting things needed for daily living? : No  Physical Activity: Insufficiently Active (03/20/2024)   Exercise Vital Sign    Days of Exercise per Week: 1 day    Minutes of Exercise per Session: 20 min  Stress: Stress Concern Present (03/20/2024)   [employer] of Occupational Health - Occupational Stress Questionnaire    Feeling of Stress: Very much  Social Connections: Socially Isolated (03/20/2024)   Social Connection and Isolation Panel    Frequency of Communication with Friends and Family: More than three times a week    Frequency of Social Gatherings with Friends and Family: More than three times a week    Attends Religious Services: Never    Database administrator or Organizations: No    Attends Engineer, structural: Not on file    Marital Status: Divorced    Allergies:  Allergies  Allergen Reactions   Lamictal [Lamotrigine] Anaphylaxis, Rash and Other (See Comments)    Stevens-Johnson syndrome and fevers, also   Lamictal [Lamotrigine] Anaphylaxis, Rash and Other (See Comments)    Fevers and Stevens-Johnson syndrome, also   Tramadol  Other (See Comments)    Pt had a seizure after taking  Tramadol !!   Erythromycin Nausea And Vomiting   Lamisil  [Terbinafine ] Nausea And Vomiting   Geodon  [Ziprasidone  Hcl] Rash   Levetiracetam Anxiety    Metabolic Disorder Labs: Lab Results  Component Value Date   HGBA1C 5.7 (H) 12/24/2023   MPG 116.89 07/20/2022   MPG 114.02 12/19/2020   No results found for: PROLACTIN Lab Results  Component Value Date   CHOL 199 06/24/2024   TRIG 218 (H) 06/24/2024   HDL 68 06/24/2024   CHOLHDL 3.4 07/20/2022   VLDL 23 07/20/2022   LDLCALC 95 06/24/2024   LDLCALC 104 (H) 12/24/2023   Lab Results  Component Value Date   TSH 3.544 07/20/2022   TSH 1.300 05/16/2021    Therapeutic Level Labs: No results found for: LITHIUM No results found for: VALPROATE No results found for: CBMZ  Current Medications: Current Outpatient Medications  Medication Sig Dispense Refill   atorvastatin  (LIPITOR) 20 MG tablet Take 1 tablet (20 mg total) by mouth at bedtime. 90 tablet 1   busPIRone  (BUSPAR ) 5 MG tablet Take 1 tablet (5 mg total) by mouth 3 (three) times daily. 90 tablet 0   calcium  carbonate (ANTACID) 500 MG chewable tablet Chew 1 tablet by mouth 4 times daily as needed for indigestion/heartburn. (Patient not taking: Reported on 06/02/2024) 150 tablet 0   clindamycin  (CLEOCIN ) 300 MG capsule Take 1 capsule (300 mg total) by mouth 4 (four) times daily. X 7 days (Patient not taking: Reported on 06/24/2024) 28 capsule 0   cloNIDine  (CATAPRES ) 0.2 MG tablet Take 1 tablet (0.2 mg total) by mouth at bedtime. 30 tablet 2   cyclobenzaprine  (FLEXERIL ) 10 MG tablet Take 1 tablet (10 mg total) by mouth 2 (two) times daily as needed for muscle spasms. 60 tablet 1   desvenlafaxine  (PRISTIQ ) 25 MG 24 hr tablet Take 3 tablets (75 mg total) by mouth daily. 90 tablet 2   gabapentin  (NEURONTIN ) 600 MG tablet Take 1 tablet (600 mg total) by mouth in the morning, at noon, in the evening, and at bedtime. 120 tablet 2   hydrOXYzine  (ATARAX ) 50 MG tablet Take 1 tablet  (50 mg total) by mouth 3 (three) times daily as needed for anxiety. 90 tablet 2   hyoscyamine  (LEVSIN ) 0.125 MG tablet Take 1 tablet (  0.125 mg total) by mouth every 6 (six) hours as needed for cramping. 30 tablet 3   mirtazapine  (REMERON  SOL-TAB) 45 MG disintegrating tablet Take 1 tablet (45 mg total) by mouth at bedtime. 30 tablet 2   ondansetron  (ZOFRAN ) 4 MG tablet Take 1 tablet (4 mg total) by mouth every 8 (eight) hours as needed for nausea or vomiting. 20 tablet 0   pantoprazole  (PROTONIX ) 40 MG tablet Take 1 tablet (40 mg total) by mouth 2 (two) times daily before a meal. 180 tablet 1   promethazine  (PHENERGAN ) 25 MG tablet Take 1 tablet (25 mg total) by mouth every 8 (eight) hours as needed for nausea or vomiting. 60 tablet 1   propranolol  (INDERAL ) 10 MG tablet Take 1 tablet (10 mg total) by mouth 3 (three) times daily as needed (anxiety, at the onset of symptoms). 90 tablet 2   valsartan  (DIOVAN ) 80 MG tablet Take 1 tablet (80 mg total) by mouth daily. **Please keep appointment for additional refills.** 90 tablet 1   No current facility-administered medications for this visit.    Objective  Psychiatric Specialty Exam: General Appearance: appears at stated age, casually dressed and groomed   Behavior: pleasant and cooperative   Psychomotor Activity: no psychomotor agitation or retardation noted   Eye Contact: fair  Speech: normal amount, volume and fluency    Mood: anxious  Affect: congruent  Thought Process: linear, goal directed, no circumstantial or tangential thought process noted, no racing thoughts or flight of ideas  Descriptions of Associations: intact   Thought Content Hallucinations: denies AH, VH , does not appear responding to stimuli  Delusions: no paranoia, delusions of control, grandeur, ideas of reference, thought broadcasting, and magical thinking  Suicidal Thoughts: denies SI, intention, plan  Homicidal Thoughts: denies HI, intention, plan    Alertness/Orientation: alert and fully oriented   Insight: fair Judgment: limited  Memory: intact   Executive Functions  Concentration: intact  Attention Span: fair  Recall: intact  Fund of Knowledge: fair   Physical Exam  General: Pleasant, well-appearing. No acute distress. Pulmonary: Normal effort. No wheezing or rales. Skin: No obvious rash or lesions. Neuro: A&Ox3.No focal deficit.  Review of Systems  No reported symptoms   Screenings: AIMS    Flowsheet Row [facility]  AIMS Total Score 0   AUDIT    Flowsheet Row [facility] Ad[facility] ([facility]  Alcohol  Use Disorder Identification Test Final Score (AUDIT) 6 11 0   GAD-7    Flowsheet Row Office Visit from 06/24/2024 in [facility] [place] [facility] [place] from 04/14/2024 in [facility] Office Visit from 12/24/2023 in [facility] [place] - A Dept Of [place]. [facility] Office Visit from 06/12/2023 in [facility] [place] - A Dept Of [place]. [facility] Clinical Support from 06/04/2023 in [facility]  Total GAD-7 Score 14 14 5 3 12    PHQ2-9    Flowsheet Row Office Visit from 06/24/2024 in [facility] [place] - A Dept Of [place]. [facility] Office Visit from 04/14/2024 in [facility] Office Visit from 12/24/2023 in [facility] - A  Dept Of [place]. [facility] Office Visit from 06/12/2023 in [facility] [place] - A Dept Of [place]. [facility] Clinical Support from  06/04/2023 in [facility]  PHQ-2 Total Score 3 3 0 0 0  PHQ-9 Total Score 9 12 6  -- 10   Flowsheet Row Office Visit from 04/14/2024 in [facility] ED from 03/10/2024 in [facility] Emergency Department at [facility] ED from 11/25/2023 in [facility] Department at [facility]  C-SSRS RISK CATEGORY Moderate Risk No Risk No Risk    Ismael Franco, MD PGY-3 Psychiatry Resident

## 2024-06-18 ENCOUNTER — Other Ambulatory Visit (HOSPITAL_COMMUNITY): Payer: Self-pay

## 2024-06-18 ENCOUNTER — Other Ambulatory Visit: Payer: Self-pay

## 2024-06-19 ENCOUNTER — Encounter (HOSPITAL_COMMUNITY): Admitting: Psychiatry

## 2024-06-19 ENCOUNTER — Other Ambulatory Visit (HOSPITAL_BASED_OUTPATIENT_CLINIC_OR_DEPARTMENT_OTHER): Payer: Self-pay

## 2024-06-19 ENCOUNTER — Other Ambulatory Visit: Payer: Self-pay

## 2024-06-20 ENCOUNTER — Other Ambulatory Visit: Payer: Self-pay

## 2024-06-20 ENCOUNTER — Encounter (HOSPITAL_COMMUNITY): Admitting: Psychiatry

## 2024-06-23 ENCOUNTER — Other Ambulatory Visit (HOSPITAL_BASED_OUTPATIENT_CLINIC_OR_DEPARTMENT_OTHER): Payer: Self-pay

## 2024-06-24 ENCOUNTER — Other Ambulatory Visit (HOSPITAL_BASED_OUTPATIENT_CLINIC_OR_DEPARTMENT_OTHER): Payer: Self-pay

## 2024-06-24 ENCOUNTER — Other Ambulatory Visit (HOSPITAL_COMMUNITY): Payer: Self-pay

## 2024-06-24 ENCOUNTER — Other Ambulatory Visit: Payer: Self-pay

## 2024-06-24 ENCOUNTER — Telehealth: Payer: Self-pay

## 2024-06-24 ENCOUNTER — Telehealth: Payer: Self-pay | Admitting: Internal Medicine

## 2024-06-24 ENCOUNTER — Ambulatory Visit: Payer: Self-pay | Attending: Family Medicine | Admitting: Family Medicine

## 2024-06-24 ENCOUNTER — Encounter: Payer: Self-pay | Admitting: Family Medicine

## 2024-06-24 VITALS — BP 122/81 | HR 68 | Ht 63.0 in | Wt 152.4 lb

## 2024-06-24 DIAGNOSIS — R1012 Left upper quadrant pain: Secondary | ICD-10-CM | POA: Diagnosis not present

## 2024-06-24 DIAGNOSIS — F17210 Nicotine dependence, cigarettes, uncomplicated: Secondary | ICD-10-CM

## 2024-06-24 DIAGNOSIS — F4310 Post-traumatic stress disorder, unspecified: Secondary | ICD-10-CM

## 2024-06-24 DIAGNOSIS — E782 Mixed hyperlipidemia: Secondary | ICD-10-CM

## 2024-06-24 DIAGNOSIS — F3341 Major depressive disorder, recurrent, in partial remission: Secondary | ICD-10-CM

## 2024-06-24 DIAGNOSIS — K861 Other chronic pancreatitis: Secondary | ICD-10-CM

## 2024-06-24 DIAGNOSIS — A0472 Enterocolitis due to Clostridium difficile, not specified as recurrent: Secondary | ICD-10-CM | POA: Diagnosis not present

## 2024-06-24 DIAGNOSIS — Z23 Encounter for immunization: Secondary | ICD-10-CM

## 2024-06-24 DIAGNOSIS — Z79899 Other long term (current) drug therapy: Secondary | ICD-10-CM

## 2024-06-24 DIAGNOSIS — R110 Nausea: Secondary | ICD-10-CM

## 2024-06-24 MED ORDER — COVID-19 MRNA VAC-TRIS(PFIZER) 30 MCG/0.3ML IM SUSY
0.3000 mL | PREFILLED_SYRINGE | Freq: Once | INTRAMUSCULAR | 0 refills | Status: AC
  Filled 2024-06-24 – 2024-06-26 (×3): qty 0.3, 1d supply, fill #0

## 2024-06-24 MED ORDER — CYCLOBENZAPRINE HCL 10 MG PO TABS
10.0000 mg | ORAL_TABLET | Freq: Two times a day (BID) | ORAL | 1 refills | Status: DC | PRN
  Filled 2024-06-24: qty 60, 30d supply, fill #0
  Filled 2024-07-03 – 2024-07-17 (×3): qty 60, 30d supply, fill #1

## 2024-06-24 MED ORDER — PANTOPRAZOLE SODIUM 40 MG PO TBEC
40.0000 mg | DELAYED_RELEASE_TABLET | Freq: Two times a day (BID) | ORAL | 1 refills | Status: AC
  Filled 2024-06-24: qty 180, 90d supply, fill #0

## 2024-06-24 MED ORDER — PROMETHAZINE HCL 25 MG PO TABS
25.0000 mg | ORAL_TABLET | Freq: Three times a day (TID) | ORAL | 1 refills | Status: DC | PRN
  Filled 2024-06-24: qty 60, 20d supply, fill #0
  Filled 2024-07-03 – 2024-07-06 (×2): qty 60, 20d supply, fill #1

## 2024-06-24 MED ORDER — VALSARTAN 80 MG PO TABS
80.0000 mg | ORAL_TABLET | Freq: Every day | ORAL | 1 refills | Status: DC
  Filled 2024-06-24: qty 90, 90d supply, fill #0

## 2024-06-24 MED ORDER — ATORVASTATIN CALCIUM 20 MG PO TABS
20.0000 mg | ORAL_TABLET | Freq: Every day | ORAL | 1 refills | Status: AC
  Filled 2024-06-24: qty 90, 90d supply, fill #0
  Filled 2024-10-13 – 2024-10-15 (×2): qty 90, 90d supply, fill #1

## 2024-06-24 NOTE — Progress Notes (Signed)
 Subjective:  Patient ID: Margaret Mccann, female    DOB: 01/29/1971  Age: 53 y.o. MRN: [id number]  CC: Medical Management of Chronic Issues (Discuss pain medication/)     Discussed the use of AI scribe software for clinical note transcription with the patient, who gave verbal consent to proceed.  History of Present Illness Margaret Mccann [name] is a 53 year old female with a history of PTSD, major depression, severe anxiety, previous suicidal attempts, substance abuse (nicotine , cocaine, alcohol ), and hypertension, hyperlipidemia, pancreatitis and Clostridium difficile infection who presents with persistent abdominal pain and diarrhea.  She experiences constant, severe abdominal pain on the left side, radiating to her back, with the most intense pain in her back. Flexeril  is used regularly for pain, and Phenergan  is taken daily for nausea, sometimes requiring multiple doses. The pain disrupts her sleep and appetite.  Chlorpromazine prescribed by GI was ineffective and Zofran  gave her headaches.  She has Clostridium difficile infection, causing episodes of diarrhea, which have improved but remain variable; she completed her course of vancomycin . Diarrhea is accompanied by sharp pain radiating to her back. She has follow-up labs scheduled in a week by GI. In 04/2024 she was hospitalized at [facility] for nausea, vomiting and dizziness, found to have elevated LFTs, CT abdomen and pelvis showed acute interstitial pancreatitis without evidence of fluid collection or pancreatic lesion, hepatic steatosis. Her last alcohol  consumption was six months ago. She avoids sugary and fatty foods, which worsen her symptoms, and has transitioned from a liquid diet to solid foods.  Her medications include valsartan , atorvastatin , Protonix , gabapentin , Pristiq , hydroxyzine , and Remeron . Zofran  and Compazine  caused severe headaches, so she prefers Phenergan  for nausea.  She has stable PTSD  and depression but feels exhausted due to her symptoms. She stays hydrated by drinking plenty of water . She is concerned about needing to move by the end of the year.    Past Medical History:  Diagnosis Date   Alcohol  withdrawal seizure with complication, with unspecified complication (HCC) 12/23/2020   Anxiety    Bipolar 1 disorder (HCC)    C. difficile colitis    Depression    GERD (gastroesophageal reflux disease)    HLD (hyperlipidemia)    HTN (hypertension)    Intentional drug overdose (HCC) 08/04/2020   Major depressive disorder, recurrent episode with anxious distress 08/11/2020   Morgellons syndrome    MRSA (methicillin resistant Staphylococcus aureus)    Suicide attempt (HCC)    Suicide by drug overdose (HCC) 08/11/2020   Tricuspid valve regurgitation 12/18/2020    Past Surgical History:  Procedure Laterality Date   APPENDECTOMY      Family History  Problem Relation Age of Onset   Heart Problems Mother        abnormal beats   Skin cancer Mother    Heart attack Father 58   Stroke Father    Diabetes Father    Severe combined immunodeficiency Sister    Hypertension Paternal Grandfather    COPD Paternal Grandfather    Breast cancer Paternal Aunt    Ulcerative colitis Neg Hx    Esophageal cancer Neg Hx     Social History   Socioeconomic History   Marital status: Single    Spouse name: Not on file   Number of children: 2   Years of education: Not on file   Highest education level: Bachelor's degree (e.g., BA, AB, BS)  Occupational History   Not on file  Tobacco Use   Smoking  status: Some Days    Current packs/day: 0.25    Average packs/day: 0.3 packs/day for 5.0 years (1.3 ttl pk-yrs)    Types: Cigarettes   Smokeless tobacco: Never  Vaping Use   Vaping status: Never Used  Substance and Sexual Activity   Alcohol  use: Not Currently    Comment: occas   Drug use: Not Currently    Types: Cocaine    Comment: relapsed the past weekend, no cocaine use  prior 6 months   Sexual activity: Not Currently    Birth control/protection: Pill  Other Topics Concern   Not on file  Social History Narrative   ** Merged History Encounter **       Social Drivers of Health   Financial Resource Strain: High Risk (03/20/2024)   Overall Financial Resource Strain (CARDIA)    Difficulty of Paying Living Expenses: Hard  Food Insecurity: Low Risk  (05/25/2024)   Received from [facility]   Hunger Vital Sign    Within the past 12 months, you worried that your food would run out before you got money to buy more: Never true    Within the past 12 months, the food you bought just didn't last and you didn't have money to get more. : Never true  Recent Concern: Food Insecurity - Food Insecurity Present (03/20/2024)   Hunger Vital Sign    Worried About Running Out of Food in the Last Year: Sometimes true    Ran Out of Food in the Last Year: Sometimes true  Transportation Needs: No Transportation Needs (05/25/2024)   Received from [facility]    In the past 12 months, has lack of reliable transportation kept you from medical appointments, meetings, work or from getting things needed for daily living? : No  Physical Activity: Insufficiently Active (03/20/2024)   Exercise Vital Sign    Days of Exercise per Week: 1 day    Minutes of Exercise per Session: 20 min  Stress: Stress Concern Present (03/20/2024)   [employer] of Occupational Health - Occupational Stress Questionnaire    Feeling of Stress: Very much  Social Connections: Socially Isolated (03/20/2024)   Social Connection and Isolation Panel    Frequency of Communication with Friends and Family: More than three times a week    Frequency of Social Gatherings with Friends and Family: More than three times a week    Attends Religious Services: Never    Database administrator or Organizations: No    Attends Engineer, structural: Not on file    Marital Status: Divorced     Allergies  Allergen Reactions   Lamictal [Lamotrigine] Anaphylaxis, Rash and Other (See Comments)    Stevens-Johnson syndrome and fevers, also   Lamictal [Lamotrigine] Anaphylaxis, Rash and Other (See Comments)    Fevers and Stevens-Johnson syndrome, also   Tramadol  Other (See Comments)    Pt had a seizure after taking Tramadol !!   Erythromycin Nausea And Vomiting   Lamisil  [Terbinafine ] Nausea And Vomiting   Geodon  [Ziprasidone  Hcl] Rash   Levetiracetam Anxiety    Outpatient Medications Prior to Visit  Medication Sig Dispense Refill   busPIRone  (BUSPAR ) 5 MG tablet Take 1 tablet (5 mg total) by mouth 3 (three) times daily. 90 tablet 0   cloNIDine  (CATAPRES ) 0.2 MG tablet Take 1 tablet (0.2 mg total) by mouth at bedtime. 30 tablet 2   desvenlafaxine  (PRISTIQ ) 25 MG 24 hr tablet Take 3 tablets (75 mg total) by mouth daily. 90  tablet 2   gabapentin  (NEURONTIN ) 600 MG tablet Take 1 tablet (600 mg total) by mouth in the morning, at noon, in the evening, and at bedtime. 120 tablet 2   hydrOXYzine  (ATARAX ) 50 MG tablet Take 1 tablet (50 mg total) by mouth 3 (three) times daily as needed for anxiety. 90 tablet 2   hyoscyamine  (LEVSIN ) 0.125 MG tablet Take 1 tablet (0.125 mg total) by mouth every 6 (six) hours as needed for cramping. 30 tablet 3   mirtazapine  (REMERON  SOL-TAB) 45 MG disintegrating tablet Take 1 tablet (45 mg total) by mouth at bedtime. 30 tablet 2   ondansetron  (ZOFRAN ) 4 MG tablet Take 1 tablet (4 mg total) by mouth every 8 (eight) hours as needed for nausea or vomiting. 20 tablet 0   propranolol  (INDERAL ) 10 MG tablet Take 1 tablet (10 mg total) by mouth 3 (three) times daily as needed (anxiety, at the onset of symptoms). 90 tablet 2   atorvastatin  (LIPITOR) 20 MG tablet Take 1 tablet (20 mg total) by mouth at bedtime. 90 tablet 1   cyclobenzaprine  (FLEXERIL ) 10 MG tablet Take 1 tablet (10 mg total) by mouth 2 (two) times daily as needed for muscle spasms. 60 tablet 1    pantoprazole  (PROTONIX ) 40 MG tablet Take 1 tablet (40 mg total) by mouth 2 (two) times daily before a meal. 180 tablet 1   prochlorperazine  (COMPAZINE ) 5 MG tablet Take 1 tablet (5 mg total) by mouth every 6 (six) hours as needed for nausea or vomiting. 30 tablet 3   promethazine  (PHENERGAN ) 25 MG tablet Take 1 tablet (25 mg total) by mouth every 8 (eight) hours as needed for nausea or vomiting. 60 tablet 1   valsartan  (DIOVAN ) 80 MG tablet Take 1 tablet (80 mg total) by mouth daily. **Please keep appointment for additional refills.** 30 tablet 0   calcium  carbonate (ANTACID) 500 MG chewable tablet Chew 1 tablet by mouth 4 times daily as needed for indigestion/heartburn. (Patient not taking: Reported on 06/02/2024) 150 tablet 0   clindamycin  (CLEOCIN ) 300 MG capsule Take 1 capsule (300 mg total) by mouth 4 (four) times daily. X 7 days (Patient not taking: Reported on 06/24/2024) 28 capsule 0   No facility-administered medications prior to visit.     ROS Review of Systems  Constitutional:  Negative for activity change and appetite change.  HENT:  Negative for sinus pressure and sore throat.   Respiratory:  Negative for chest tightness, shortness of breath and wheezing.   Cardiovascular:  Negative for chest pain and palpitations.  Gastrointestinal:  Positive for abdominal pain and nausea. Negative for abdominal distention and constipation.  Genitourinary: Negative.   Musculoskeletal: Negative.   Psychiatric/Behavioral:  Negative for behavioral problems and dysphoric mood.     Objective:  BP 122/81   Pulse 68   Ht 5' 3 (1.6 m)   Wt 152 lb 6.4 oz (69.1 kg)   LMP 10/27/2019   SpO2 99%   BMI 27.00 kg/m      06/24/2024   10:41 AM 06/02/2024    2:50 PM 04/16/2024    7:03 PM  BP/Weight  Systolic BP 122 126   Diastolic BP 81 80   Wt. (Lbs) 152.4 141.13   BMI 27 kg/m2 25 kg/m2      Information is confidential and restricted. Go to Review Flowsheets to unlock data.      Physical  Exam Constitutional:      Appearance: She is well-developed.  Cardiovascular:  Rate and Rhythm: Normal rate.     Heart sounds: Normal heart sounds. No murmur heard. Pulmonary:     Effort: Pulmonary effort is normal.     Breath sounds: Normal breath sounds. No wheezing or rales.  Chest:     Chest wall: No tenderness.  Abdominal:     General: Bowel sounds are normal. There is no distension.     Palpations: Abdomen is soft. There is no mass.     Tenderness: There is abdominal tenderness (LUQ).  Musculoskeletal:        General: Normal range of motion.     Right lower leg: No edema.     Left lower leg: No edema.  Neurological:     Mental Status: She is alert and oriented to person, place, and time.  Psychiatric:        Mood and Affect: Mood normal.        Latest Ref Rng & Units 06/10/2024    1:04 PM 06/02/2024    3:41 PM 05/05/2024    2:38 PM  CMP  Glucose 70 - 99 mg/dL  92  89   BUN 6 - 23 mg/dL  14  16   Creatinine 9.59 - 1.20 mg/dL  9.24  9.31   Sodium 864 - 145 mEq/L  134  134   Potassium 3.5 - 5.1 mEq/L  4.4  5.0   Chloride 96 - 112 mEq/L  101  101   CO2 19 - 32 mEq/L  26  25   Calcium  8.4 - 10.5 mg/dL  9.8  9.8   Total Protein 6.0 - 8.3 g/dL 7.0  7.7  7.2   Total Bilirubin 0.2 - 1.2 mg/dL 0.2  0.3  0.4   Alkaline Phos 39 - 117 U/L 86  95  93   AST 0 - 37 U/L 41  60  35   ALT 0 - 35 U/L 102  110  68     Lipid Panel     Component Value Date/Time   CHOL 213 (H) 12/24/2023 1151   TRIG 134 12/24/2023 1151   HDL 86 12/24/2023 1151   CHOLHDL 3.4 07/20/2022 0706   VLDL 23 07/20/2022 0706   LDLCALC 104 (H) 12/24/2023 1151    CBC    Component Value Date/Time   WBC 6.0 06/02/2024 1541   RBC 3.85 (L) 06/02/2024 1541   HGB 11.9 (L) 06/02/2024 1541   HGB 11.1 12/24/2023 1151   HCT 36.0 06/02/2024 1541   HCT 32.7 (L) 12/24/2023 1151   PLT 396.0 06/02/2024 1541   PLT 297 12/24/2023 1151   MCV 93.3 06/02/2024 1541   MCV 89 12/24/2023 1151   MCH 30.1 03/10/2024  2047   MCHC 33.0 06/02/2024 1541   RDW 15.3 06/02/2024 1541   RDW 13.7 12/24/2023 1151   LYMPHSABS 1.6 06/02/2024 1541   LYMPHSABS 1.8 12/24/2023 1151   MONOABS 0.8 06/02/2024 1541   EOSABS 0.1 06/02/2024 1541   EOSABS 0.1 12/24/2023 1151   BASOSABS 0.0 06/02/2024 1541   BASOSABS 0.0 12/24/2023 1151    Lab Results  Component Value Date   HGBA1C 5.7 (H) 12/24/2023       Assessment & Plan Chronic pancreatitis with exocrine pancreatic insufficiency and chronic abdominal pain Chronic pancreatitis with severe left-sided abdominal pain radiating to the back. Alcohol  abstinence for six months emphasized. Gradual reintroduction of solid foods advised. - Continue Flexeril  for abdominal pain. - Continue Protonix . - Advise to avoid alcohol  and fatty or sugary foods.  Nausea Nausea persists; Phenergan  preferred for symptom control. - Refill Phenergan  prescription.  Diarrhea secondary to Clostridium difficile Diarrhea improved but intermittent with sharp abdominal pain. On vancomycin  with retesting planned. - Continue vancomycin . - Plan for retesting for Clostridium difficile infection in a week.  Hypertension Blood pressure well-controlled on current regimen. - Continue Valsartan . -Counseled on blood pressure goal of less than 130/80, low-sodium, DASH diet, medication compliance, 150 minutes of moderate intensity exercise per week. Discussed medication compliance, adverse effects.   Mixed hyperlipidemia Previous cholesterol elevated; liver enzymes high. No immediate need for repeat liver testing. - Check cholesterol levels today. - Continue Atorvastatin .  Major depressive disorder and post-traumatic stress disorder PTSD and depression well-managed. Reports exhaustion likely due to chronic pain and sleep disturbances. - Continue Desvenlafaxine , Hydroxyzine , Mirtazapine , and Gabapentin .  General Health Maintenance Due for mammogram and cervical cancer screening. - Schedule  complete physical exam including Pap smear in six weeks. - Administer flu shot today.       Meds ordered this encounter  Medications   atorvastatin  (LIPITOR) 20 MG tablet    Sig: Take 1 tablet (20 mg total) by mouth at bedtime.    Dispense:  90 tablet    Refill:  1   cyclobenzaprine  (FLEXERIL ) 10 MG tablet    Sig: Take 1 tablet (10 mg total) by mouth 2 (two) times daily as needed for muscle spasms.    Dispense:  60 tablet    Refill:  1   pantoprazole  (PROTONIX ) 40 MG tablet    Sig: Take 1 tablet (40 mg total) by mouth 2 (two) times daily before a meal.    Dispense:  180 tablet    Refill:  1   promethazine  (PHENERGAN ) 25 MG tablet    Sig: Take 1 tablet (25 mg total) by mouth every 8 (eight) hours as needed for nausea or vomiting.    Dispense:  60 tablet    Refill:  1    Discontinue Compazine    valsartan  (DIOVAN ) 80 MG tablet    Sig: Take 1 tablet (80 mg total) by mouth daily. **Please keep appointment for additional refills.**    Dispense:  90 tablet    Refill:  1    Please keep upcoming appt for additional refills.    Follow-up: Return in about 6 weeks (around 08/05/2024) for CPE/ Preventive Health Exam.       Corrina Sabin, MD, FAAFP. [facility] [place], [place] [phone]   06/24/2024, 12:41 PM

## 2024-06-24 NOTE — Patient Instructions (Signed)
 VISIT SUMMARY:  Today, you were seen for persistent abdominal pain and diarrhea related to your chronic pancreatitis and Clostridium difficile infection. We discussed your symptoms, current medications, and made some adjustments to your treatment plan to help manage your conditions more effectively.  YOUR PLAN:  -CHRONIC PANCREATITIS WITH EXOCRINE PANCREATIC INSUFFICIENCY AND CHRONIC ABDOMINAL PAIN: Chronic pancreatitis is a long-lasting inflammation of the pancreas that impairs its ability to aid in digestion and causes severe abdominal pain. You should continue taking Flexeril  for pain and Protonix  for stomach acid. Avoid alcohol , fatty, and sugary foods, and gradually reintroduce solid foods into your diet.  -NAUSEA: Nausea is a feeling of sickness with an inclination to vomit. You should continue using Phenergan  as it helps control your nausea. Your prescription for Phenergan  has been refilled.  -DIARRHEA SECONDARY TO CLOSTRIDIUM DIFFICILE: Clostridium difficile infection causes severe diarrhea and abdominal pain. Continue taking vancomycin  as prescribed. We will retest for the infection in a week to monitor your progress.  -HYPERTENSION: Hypertension is high blood pressure. Your blood pressure is well-controlled with your current medication, Valsartan . Continue taking it as prescribed.  -MIXED HYPERLIPIDEMIA: Mixed hyperlipidemia is a condition with elevated levels of cholesterol and other fats in the blood. We will check your cholesterol levels today. Continue taking Atorvastatin  as prescribed.  -MAJOR DEPRESSIVE DISORDER AND POST-TRAUMATIC STRESS DISORDER: Major depressive disorder and PTSD are mental health conditions that affect your mood and emotional state. Your conditions are well-managed with your current medications. Continue taking Desvenlafaxine , Hydroxyzine , Mirtazapine , and Gabapentin  as prescribed.  -GENERAL HEALTH MAINTENANCE: You are due for a mammogram and cervical cancer  screening. We will schedule a complete physical exam, including a Pap smear, in six weeks. You will also receive a flu shot today.  INSTRUCTIONS:  Please follow up in a week for retesting of your Clostridium difficile infection. Additionally, schedule a complete physical exam including a Pap smear in six weeks.

## 2024-06-24 NOTE — Telephone Encounter (Signed)
 I met with the patient when she was in the clinic today. She patient explained that she is currently living in [place] in a house and her parents have been paying the rent. She stated that the rent has been paid through December and her parents will not be able to pay her rent going forward, so she needs to look for a new apartment.  She said she has contacted [employer] and they have some HUD options available. She currently does not work and when I asked her how she will pay for housing, she said she is going to get a job. She has been looking on Linkedin and I told her to contact the [employer] and provided her with their phone number and address. I also provided her with the phone numbers for the [employer] and [employer].  In addition, we checked the app: http://harris-peterson.info/ on her phone and I showed her how to navigate the resource in zip code area.    She is uninsured and I explained to her that there are [facility] DSS [place] Eligibility caseworkers on the 4th floor of our building and I encouraged her to speak to them before she leave the clinic today.  I offered to take her to their office and she said she would go on her own.   I told her to please call me if she has any questions.

## 2024-06-24 NOTE — Telephone Encounter (Signed)
 Received a call from patient requesting refill for compazine  that was took off her chart, [facility] denied her prescription. Please review and advise  Thank you

## 2024-06-25 ENCOUNTER — Telehealth: Payer: Self-pay

## 2024-06-25 ENCOUNTER — Other Ambulatory Visit: Payer: Self-pay

## 2024-06-25 ENCOUNTER — Ambulatory Visit: Payer: Self-pay | Admitting: Family Medicine

## 2024-06-25 ENCOUNTER — Other Ambulatory Visit (HOSPITAL_BASED_OUTPATIENT_CLINIC_OR_DEPARTMENT_OTHER): Payer: Self-pay

## 2024-06-25 LAB — LP+NON-HDL CHOLESTEROL
Cholesterol, Total: 199 mg/dL (ref 100–199)
HDL: 68 mg/dL (ref >39)
LDL Chol Calc (NIH): 95 mg/dL (ref 0–99)
Total Non-HDL-Chol (LDL+VLDL): 131 mg/dL — ABNORMAL HIGH (ref 0–129)
Triglycerides: 218 mg/dL — ABNORMAL HIGH (ref 0–149)
VLDL Cholesterol Cal: 36 mg/dL (ref 5–40)

## 2024-06-25 NOTE — Telephone Encounter (Signed)
 Sent pt my chart message reminding her that Margaret Mccann took her off Compazine  last month and sent Phenergan  instead.  Asked her to clarify exactly what she was taking before I do anything

## 2024-06-25 NOTE — Telephone Encounter (Signed)
 Sent message to [facility] to see if patient can refill Compazine 

## 2024-06-25 NOTE — Telephone Encounter (Signed)
 Last month you told patient she could stop the Compazine  and sent in Phenergan  instead.  Her message states she alternates the two and wants a refill of the Compazine .  Ok to refill?

## 2024-06-26 ENCOUNTER — Other Ambulatory Visit (HOSPITAL_BASED_OUTPATIENT_CLINIC_OR_DEPARTMENT_OTHER): Payer: Self-pay

## 2024-06-26 ENCOUNTER — Other Ambulatory Visit: Payer: Self-pay

## 2024-06-26 ENCOUNTER — Ambulatory Visit (HOSPITAL_COMMUNITY): Admitting: Psychiatry

## 2024-06-26 ENCOUNTER — Telehealth: Payer: Self-pay

## 2024-06-26 VITALS — BP 121/88 | Wt 150.8 lb

## 2024-06-26 DIAGNOSIS — I10 Essential (primary) hypertension: Secondary | ICD-10-CM

## 2024-06-26 DIAGNOSIS — F411 Generalized anxiety disorder: Secondary | ICD-10-CM

## 2024-06-26 DIAGNOSIS — F3341 Major depressive disorder, recurrent, in partial remission: Secondary | ICD-10-CM

## 2024-06-26 DIAGNOSIS — F4310 Post-traumatic stress disorder, unspecified: Secondary | ICD-10-CM

## 2024-06-26 DIAGNOSIS — F332 Major depressive disorder, recurrent severe without psychotic features: Secondary | ICD-10-CM

## 2024-06-26 MED ORDER — CLONIDINE HCL 0.2 MG PO TABS
0.2000 mg | ORAL_TABLET | Freq: Every day | ORAL | 0 refills | Status: DC
Start: 2024-06-26 — End: 2024-08-01
  Filled 2024-06-26 – 2024-07-13 (×2): qty 45, 45d supply, fill #0
  Filled 2024-07-14: qty 30, 30d supply, fill #0
  Filled 2024-07-31: qty 15, 15d supply, fill #1

## 2024-06-26 MED ORDER — MIRTAZAPINE 45 MG PO TBDP
45.0000 mg | ORAL_TABLET | Freq: Every day | ORAL | 0 refills | Status: DC
Start: 2024-06-26 — End: 2024-08-01
  Filled 2024-06-26 – 2024-07-18 (×3): qty 45, 45d supply, fill #0

## 2024-06-26 MED ORDER — HYDROXYZINE HCL 50 MG PO TABS
50.0000 mg | ORAL_TABLET | Freq: Three times a day (TID) | ORAL | 0 refills | Status: DC | PRN
Start: 2024-06-26 — End: 2024-08-01
  Filled 2024-06-26: qty 135, 45d supply, fill #0
  Filled 2024-07-03: qty 135, 30d supply, fill #0

## 2024-06-26 MED ORDER — BUSPIRONE HCL 5 MG PO TABS
5.0000 mg | ORAL_TABLET | Freq: Three times a day (TID) | ORAL | 0 refills | Status: DC
  Filled 2024-06-26: qty 90, 30d supply, fill #0
  Filled 2024-06-26: qty 135, 45d supply, fill #0
  Filled 2024-07-17: qty 45, 15d supply, fill #1

## 2024-06-26 MED ORDER — PROPRANOLOL HCL 10 MG PO TABS
ORAL_TABLET | ORAL | 0 refills | Status: DC
Start: 2024-06-26 — End: 2024-07-17
  Filled 2024-06-26 – 2024-07-09 (×3): qty 21, 14d supply, fill #0

## 2024-06-26 MED ORDER — DESVENLAFAXINE SUCCINATE ER 25 MG PO TB24
75.0000 mg | ORAL_TABLET | Freq: Every day | ORAL | 0 refills | Status: DC
  Filled 2024-06-26 – 2024-07-13 (×3): qty 135, 45d supply, fill #0

## 2024-06-26 MED ORDER — GABAPENTIN 600 MG PO TABS
600.0000 mg | ORAL_TABLET | Freq: Four times a day (QID) | ORAL | 0 refills | Status: DC
  Filled 2024-06-26 – 2024-07-03 (×2): qty 180, 45d supply, fill #0

## 2024-06-26 NOTE — Telephone Encounter (Signed)
 Sent patient a Industrial/product designer suggestions

## 2024-06-26 NOTE — Telephone Encounter (Signed)
 Margaret Mccann, per phone note 06/10/2024 message from patient at that time documented the following:  Inbound call from patient stating she was on medication compazine  but stopped taking it since it was giving her headaches would like new medication for nausea.   If the patient does not have any adverse response to Compazine , ok to send in RX for Compazine  5mg  tab one tab po Q 8 hrs # 30, no refills. Patient will need office follow up for further refills as this is not a long term medication and I am concerned about polypharmacy use.  THX

## 2024-06-26 NOTE — Telephone Encounter (Signed)
 I got this response from the patient:  Hi there, I apologize for the confusion, but it was Margaret Mccann  that was causing the headaches. NOT Margaret Mccann . I have been taking the Margaret Mccann  Mccann with Margaret Mccann . I do not take them together. I'm concerned at this point because I had three refills on this medication and it accidentally was wiped out of my chart yesterday after I saw my general practitioner. Can you please talk to her or have her call me? I really need this medication. It helps tremendously with the nausea as does the  Margaret Mccann  and Mccann them seems to be the most effective alleviating, my nausea and vomiting.Margaret Mccann i'm not asking for anything different than what I have been doing so I am confused and getting frustrated.  Please advise

## 2024-06-26 NOTE — Telephone Encounter (Signed)
 Margaret Mccann, I do not recommend refilling Compazine  as phone note 06/10/2024 noted patient endorsed having headaches after taking it. She has polypharmacy. Please schedule patient for a follow up appointment in 3 to 4 weeks if not already scheduled. Pls give patient samples of [place] two tabs po bid PRN for N/V. THX.

## 2024-06-27 ENCOUNTER — Other Ambulatory Visit (HOSPITAL_BASED_OUTPATIENT_CLINIC_OR_DEPARTMENT_OTHER): Payer: Self-pay

## 2024-06-27 ENCOUNTER — Telehealth: Payer: Self-pay | Admitting: Nurse Practitioner

## 2024-06-27 ENCOUNTER — Other Ambulatory Visit: Payer: Self-pay

## 2024-06-27 MED ORDER — PROCHLORPERAZINE MALEATE 5 MG PO TABS
5.0000 mg | ORAL_TABLET | Freq: Three times a day (TID) | ORAL | 0 refills | Status: DC | PRN
  Filled 2024-06-27 (×2): qty 30, 10d supply, fill #0

## 2024-06-27 NOTE — Telephone Encounter (Signed)
 Spoke to pharmacist who just wanted to clarify that patient was alternating Compazine  and Phenergan  since these are not usually prescribed at the same time.  I clarified how she was taking it and that she was following up with provider before getting any more

## 2024-06-27 NOTE — Telephone Encounter (Signed)
 Inbound call from Perry at [facility] requesting to discuss compazine  medication. States patient filled promethazine  medication on 9/30. Scott stated patient advised she alternates medications but he still concerned about medication interactions. Requesting a call at [phone]. Please advise, thank you

## 2024-06-27 NOTE — Telephone Encounter (Signed)
 Inbound call from patient stating pharmacy in [facility] in [facility] is requesting a call for for confirmation for patient to receive medication compazine . Please advise  Thank you

## 2024-06-27 NOTE — Telephone Encounter (Signed)
 Refilled Compazine .  Sent mychart message to patient asking her to call and schedule a follow up with Colleen

## 2024-06-30 ENCOUNTER — Telehealth: Payer: Self-pay

## 2024-06-30 NOTE — Telephone Encounter (Signed)
 Telephoned patient at mobile number. Patient states she has [place] Medicaid  and patient is ineligible for mammogram scholarship application. [facility]

## 2024-07-03 ENCOUNTER — Other Ambulatory Visit: Payer: Self-pay | Admitting: Nurse Practitioner

## 2024-07-03 ENCOUNTER — Other Ambulatory Visit: Payer: Self-pay

## 2024-07-03 ENCOUNTER — Other Ambulatory Visit (HOSPITAL_BASED_OUTPATIENT_CLINIC_OR_DEPARTMENT_OTHER): Payer: Self-pay

## 2024-07-04 ENCOUNTER — Other Ambulatory Visit: Payer: Self-pay | Admitting: Internal Medicine

## 2024-07-04 ENCOUNTER — Other Ambulatory Visit: Payer: Self-pay

## 2024-07-04 ENCOUNTER — Telehealth: Payer: Self-pay

## 2024-07-04 ENCOUNTER — Other Ambulatory Visit (HOSPITAL_BASED_OUTPATIENT_CLINIC_OR_DEPARTMENT_OTHER): Payer: Self-pay

## 2024-07-04 MED ORDER — PROCHLORPERAZINE MALEATE 5 MG PO TABS
5.0000 mg | ORAL_TABLET | Freq: Three times a day (TID) | ORAL | 0 refills | Status: DC | PRN
  Filled 2024-07-04: qty 30, 10d supply, fill #0

## 2024-07-04 MED ORDER — VANCOMYCIN HCL 125 MG PO CAPS
125.0000 mg | ORAL_CAPSULE | Freq: Four times a day (QID) | ORAL | 0 refills | Status: DC
  Filled 2024-07-04: qty 112, 28d supply, fill #0

## 2024-07-04 NOTE — Telephone Encounter (Signed)
 Pt with hx of cdiff. She has messaged the office requesting another prescription be called in for her because her diarrhea has come back and she reports it is worse than it was before when she had cdiff. Please advise.

## 2024-07-04 NOTE — Telephone Encounter (Signed)
 Please see patient's request for another antibiotic.  She keeps messaging me about this stuff

## 2024-07-04 NOTE — Telephone Encounter (Signed)
 Please see patient's most recent mychart message regarding diarrhea and Compazine .  She has a follow up appointment with you on 11/24.

## 2024-07-04 NOTE — Telephone Encounter (Signed)
 This note came back to me. I just recommended vancomycin  125 mg p.o. 4 times daily x 1 month (30 days), on a forwarded message . Please make sure this is called in ASAP. Please send me a note back saying that this has been taking care of. Thanks, Dr. Abran

## 2024-07-04 NOTE — Telephone Encounter (Signed)
 Margaret Mccann and Margaret Mccann, patient tested positive for C. Diff 05/06/2024, treated with Vancomycin  125mg  po qid x 14 days.  It is too soon to recheck C. Diff stool tests as the result will stay positive for several months.  C. Diff relapse is suspected therefore I recommend Dificid 200mg  one tab po bid x 10 days # 20, no refills.   If Dificid is not covered by her insurance carrier then send in RX for Vancomycin  125 mg one capsule po qid x 14 day, then 1 capsule po tid x 1 weeks, then 1 capsule po bid x 1 week then 1 capsule po once daily x 1 week, then one capsule po every 3rd day x 2 weeks then off.   Patient to contact office in 2 weeks with an update, that update should be on a new message thread. THX.

## 2024-07-04 NOTE — Telephone Encounter (Signed)
 Prescribe vancomycin  125 mg p.o. 4 times daily x 4 weeks

## 2024-07-04 NOTE — Telephone Encounter (Signed)
See additional message.

## 2024-07-04 NOTE — Telephone Encounter (Signed)
 Prescription for vancomycin  sent to pharmacy, refill for compazine  sent to pharmacy. Pt aware and knows compazine  will not be refilled again.

## 2024-07-04 NOTE — Telephone Encounter (Signed)
 See below

## 2024-07-06 ENCOUNTER — Other Ambulatory Visit (HOSPITAL_BASED_OUTPATIENT_CLINIC_OR_DEPARTMENT_OTHER): Payer: Self-pay

## 2024-07-07 ENCOUNTER — Other Ambulatory Visit (HOSPITAL_BASED_OUTPATIENT_CLINIC_OR_DEPARTMENT_OTHER): Payer: Self-pay

## 2024-07-07 ENCOUNTER — Other Ambulatory Visit: Payer: Self-pay

## 2024-07-09 ENCOUNTER — Other Ambulatory Visit (HOSPITAL_BASED_OUTPATIENT_CLINIC_OR_DEPARTMENT_OTHER): Payer: Self-pay

## 2024-07-11 ENCOUNTER — Other Ambulatory Visit (HOSPITAL_BASED_OUTPATIENT_CLINIC_OR_DEPARTMENT_OTHER): Payer: Self-pay

## 2024-07-13 ENCOUNTER — Other Ambulatory Visit (HOSPITAL_BASED_OUTPATIENT_CLINIC_OR_DEPARTMENT_OTHER): Payer: Self-pay

## 2024-07-14 ENCOUNTER — Other Ambulatory Visit: Payer: Self-pay

## 2024-07-14 ENCOUNTER — Other Ambulatory Visit (HOSPITAL_BASED_OUTPATIENT_CLINIC_OR_DEPARTMENT_OTHER): Payer: Self-pay

## 2024-07-15 ENCOUNTER — Other Ambulatory Visit (HOSPITAL_BASED_OUTPATIENT_CLINIC_OR_DEPARTMENT_OTHER): Payer: Self-pay

## 2024-07-16 ENCOUNTER — Telehealth: Payer: Self-pay | Admitting: Nurse Practitioner

## 2024-07-16 ENCOUNTER — Telehealth (HOSPITAL_COMMUNITY): Payer: Self-pay

## 2024-07-16 ENCOUNTER — Other Ambulatory Visit (HOSPITAL_BASED_OUTPATIENT_CLINIC_OR_DEPARTMENT_OTHER): Payer: Self-pay

## 2024-07-16 DIAGNOSIS — A0472 Enterocolitis due to Clostridium difficile, not specified as recurrent: Secondary | ICD-10-CM

## 2024-07-16 DIAGNOSIS — R197 Diarrhea, unspecified: Secondary | ICD-10-CM

## 2024-07-16 NOTE — Telephone Encounter (Signed)
 1.  I think is highly unlikely that she has Stevens-Johnson syndrome.  Oral vancomycin  is not absorbed systemically. 2.  She should see her PCP regarding concerns of skin issues, flushing, etc, etc. 3.  She can come into the office and submit stool study for C. Difficile 4.  Agree with ER evaluation if significantly unwell

## 2024-07-16 NOTE — Telephone Encounter (Signed)
 Patient called in stating she is running out of Propranolol  10mg  . Per patient she has not been taking medications as directed. Patient stated she is taking medication 3 times a day as needed. This was previous directions from last provider.

## 2024-07-16 NOTE — Telephone Encounter (Signed)
 See phone noted dated 07/16/2024

## 2024-07-16 NOTE — Telephone Encounter (Signed)
 Inbound call from patient stating that she was hospitalized for a C-diff infection and was taken off her Vancomycin  125 MG capsules. Patient stated that she during that hospital visit she found out she could be having pancreatitis. Patient stated that she has been having similar symptoms of when she was hospitalized and believes this could be a underlying condition. Patient is requesting a call back. Please advise.

## 2024-07-16 NOTE — Telephone Encounter (Signed)
 Patient calls with concerns that vancomycin  given to her recently has caused her to have Stevens-Johnson syndrome. Throughout our conversation, patient has very pressured speech and says she needs help figuring out what is wrong because she has been sick most of the year.  Patient says she had vancomycin  for C Diff infection by our office prior to hospitalization in August at [facility]. While at [facility], she was taken off vancomycin  as they told her she did not have C Diff but rather had pancreatitis. States she was very sick with this and was hospitalized x 7 days.   Patient contacted our office 07/03/24 by rhona note to tell us  her diarrhea returned and was worse than prior episodes.  At that time, she was preemptively placed back on another course of vancomycin  125 mg QID x 4 weeks. She says she picked up the medication that day and has been taking as prescribed every day until yesterday. Discontinued vancomycin  because she says over the last 2-3 days, she has developed severe oral ulcerations, bleeding and cracks in the sides of her mouth as well as what she feels is tongue swelling (I keep biting my tongue) and cold sensitivity to foods/liquids. She state that she has had blurry vision with spinning of the room as well. Denies any skin peeling but states she does have flushing along with fevers. Later states that she has had fevers intermittently since June. 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   States she knows her C Diff is going to come back if she doesn't take the remainder of vancomycin  and doesn't know what to do.  Patient complains of increasingly frequent LUQ pain  that is right under breast bone. Also complains of return of diarrhea though better than it was. +nausea. No vomiting. Patient denies any alcohol  use or illicit drug use since 04/2024 hospitalization.  I have advised patient that  for now, she should go on a clear liquid diet for the next few days (in the case she is having another pancreatitis flare) and that if she develops SOB/Difficulty breathing, additional tongue swelling, inability to tolerate liquids PO, intractable vomiting, she should go to the emergency room immediately.

## 2024-07-16 NOTE — Telephone Encounter (Signed)
 I have spoken to patient to advise of Dr Nancyann response and recommendations. She verbalizes understanding to come to our basement floor lab to pick up stool kit for repeat C Diff testing to insure she no longer has this infection. She is also asked to follow with her PCP for evaluation of the flushing, fevers and oral ulcerations and she is asked to go to the ER immediately for alarm symptoms as previously discussed. Patient requests that we send a copy of our recommendations to Dr Newlin so she is aware.

## 2024-07-17 ENCOUNTER — Other Ambulatory Visit: Payer: Self-pay

## 2024-07-17 ENCOUNTER — Other Ambulatory Visit (HOSPITAL_BASED_OUTPATIENT_CLINIC_OR_DEPARTMENT_OTHER): Payer: Self-pay

## 2024-07-17 ENCOUNTER — Telehealth (HOSPITAL_COMMUNITY): Payer: Self-pay | Admitting: Psychiatry

## 2024-07-17 DIAGNOSIS — I10 Essential (primary) hypertension: Secondary | ICD-10-CM

## 2024-07-17 MED ORDER — PROPRANOLOL HCL 10 MG PO TABS
10.0000 mg | ORAL_TABLET | Freq: Three times a day (TID) | ORAL | 0 refills | Status: DC | PRN
  Filled 2024-07-17 – 2024-07-31 (×5): qty 36, 12d supply, fill #0

## 2024-07-17 NOTE — Telephone Encounter (Signed)
 Patient called requesting to discontinue BuSpar  and take propranolol . In her previous appointment, we discussed tapering off propranolol  as she was placed on BuSpar  in her recent hospitalization and she felt it was not as effective. Plan is for her be go back on PRN propranolol  and discontinue BuSpar . Prescribing enough propranolol  to bridge her to her upcoming appt on 11/4.

## 2024-07-18 ENCOUNTER — Other Ambulatory Visit (HOSPITAL_BASED_OUTPATIENT_CLINIC_OR_DEPARTMENT_OTHER): Payer: Self-pay

## 2024-07-19 ENCOUNTER — Other Ambulatory Visit (HOSPITAL_BASED_OUTPATIENT_CLINIC_OR_DEPARTMENT_OTHER): Payer: Self-pay

## 2024-07-21 ENCOUNTER — Other Ambulatory Visit: Payer: Self-pay

## 2024-07-21 NOTE — Progress Notes (Deleted)
 [facility] MD/PA/NP OP Progress Note   Margaret Mccann  MRN:  [id number]   Assessment and Plan: Patient presents in person for a follow-up appointment. In the prior visit, plan was tapering off the propranolol . In the interim, patient requested to be placed back no propranolol  and discontinue BuSpar .   # GAD (generalized anxiety disorder) - Start therapy - Continue Pristiq  75 mg daily - Continue Remeron  45 mg at bedtime - Continue gabapentin  600 mg four times daily  - Continue Atarax  50 MG TID PRN - Continue propranolol  10 MG tablets TID PRN for anxiety  # Major depressive disorder, recurrent episode, in partial remission with anxious distress (HCC) - Remeron  as above - Pristiq  as above   # Primary hypertension -Continue clonidine  0.2 MG at bedtime (states this is also for PTSD) -Propranolol  as above -Advised PCP follow-up  Chief Complaint: MM follow-up   Interval history:  Patient seen ***.  Patient reports feeling *** today. Since the previous visit, ***. Stressors include ***.   Regarding psychiatric symptoms, ***. Patient reports the medications are ***. Patient reports the following adverse effects: ***.   Patient reports *** sleep, ***. Patient reports *** appetite, ***.   Patient denies current SI, HI, and AVH. ***  Substance use: *** Currently smoking 0.5 pack daily Alcohol : most recently 04/2023 Illicit: Denies current cocaine use (last time was 3-4 years ago). Denies other current illicit substance use.    Past Psychiatric History:  MDD , anxiety, PTSD, Substance abuse (cocaine, alcohol )  Family Psychiatric History: Sister - committed suicide in 2005 by overdosing.  Social History: Living: living alone Occupation: denies Children: yes, one daughter and one son Support: sponsor and group   Past Medical History:  Past Medical History:  Diagnosis Date   Alcohol  withdrawal seizure with complication, with unspecified complication (HCC) 12/23/2020   Anxiety     Bipolar 1 disorder (HCC)    C. difficile colitis    Depression    GERD (gastroesophageal reflux disease)    HLD (hyperlipidemia)    HTN (hypertension)    Intentional drug overdose (HCC) 08/04/2020   Major depressive disorder, recurrent episode with anxious distress 08/11/2020   Morgellons syndrome    MRSA (methicillin resistant Staphylococcus aureus)    Suicide attempt (HCC)    Suicide by drug overdose (HCC) 08/11/2020   Tricuspid valve regurgitation 12/18/2020    Past Surgical History:  Procedure Laterality Date   APPENDECTOMY      Family History:  Family History  Problem Relation Age of Onset   Heart Problems Mother        abnormal beats   Skin cancer Mother    Heart attack Father 48   Stroke Father    Diabetes Father    Severe combined immunodeficiency Sister    Hypertension Paternal Grandfather    COPD Paternal Grandfather    Breast cancer Paternal Aunt    Ulcerative colitis Neg Hx    Esophageal cancer Neg Hx     Social History:  Social History   Socioeconomic History   Marital status: Single    Spouse name: Not on file   Number of children: 2   Years of education: Not on file   Highest education level: Bachelor's degree (e.g., BA, AB, BS)  Occupational History   Not on file  Tobacco Use   Smoking status: Some Days    Current packs/day: 0.25    Average packs/day: 0.3 packs/day for 5.0 years (1.3 ttl pk-yrs)    Types: Cigarettes  Smokeless tobacco: Never  Vaping Use   Vaping status: Never Used  Substance and Sexual Activity   Alcohol  use: Not Currently    Comment: occas   Drug use: Not Currently    Types: Cocaine    Comment: relapsed the past weekend, no cocaine use prior 6 months   Sexual activity: Not Currently    Birth control/protection: Pill  Other Topics Concern   Not on file  Social History Narrative   ** Merged History Encounter **       Social Drivers of Health   Financial Resource Strain: High Risk (03/20/2024)   Overall  Financial Resource Strain (CARDIA)    Difficulty of Paying Living Expenses: Hard  Food Insecurity: Low Risk  (05/25/2024)   Received from [facility]   Hunger Vital Sign    Within the past 12 months, you worried that your food would run out before you got money to buy more: Never true    Within the past 12 months, the food you bought just didn't last and you didn't have money to get more. : Never true  Recent Concern: Food Insecurity - Food Insecurity Present (03/20/2024)   Hunger Vital Sign    Worried About Running Out of Food in the Last Year: Sometimes true    Ran Out of Food in the Last Year: Sometimes true  Transportation Needs: No Transportation Needs (05/25/2024)   Received from [facility]    In the past 12 months, has lack of reliable transportation kept you from medical appointments, meetings, work or from getting things needed for daily living? : No  Physical Activity: Insufficiently Active (03/20/2024)   Exercise Vital Sign    Days of Exercise per Week: 1 day    Minutes of Exercise per Session: 20 min  Stress: Stress Concern Present (03/20/2024)   [employer] of Occupational Health - Occupational Stress Questionnaire    Feeling of Stress: Very much  Social Connections: Socially Isolated (03/20/2024)   Social Connection and Isolation Panel    Frequency of Communication with Friends and Family: More than three times a week    Frequency of Social Gatherings with Friends and Family: More than three times a week    Attends Religious Services: Never    Database Administrator or Organizations: No    Attends Engineer, Structural: Not on file    Marital Status: Divorced    Allergies:  Allergies  Allergen Reactions   Lamictal [Lamotrigine] Anaphylaxis, Rash and Other (See Comments)    Stevens-Johnson syndrome and fevers, also   Lamictal [Lamotrigine] Anaphylaxis, Rash and Other (See Comments)    Fevers and Stevens-Johnson syndrome, also    Tramadol  Other (See Comments)    Pt had a seizure after taking Tramadol !!   Erythromycin Nausea And Vomiting   Lamisil  [Terbinafine ] Nausea And Vomiting   Geodon  [Ziprasidone  Hcl] Rash   Levetiracetam Anxiety    Metabolic Disorder Labs: Lab Results  Component Value Date   HGBA1C 5.7 (H) 12/24/2023   MPG 116.89 07/20/2022   MPG 114.02 12/19/2020   No results found for: PROLACTIN Lab Results  Component Value Date   CHOL 199 06/24/2024   TRIG 218 (H) 06/24/2024   HDL 68 06/24/2024   CHOLHDL 3.4 07/20/2022   VLDL 23 07/20/2022   LDLCALC 95 06/24/2024   LDLCALC 104 (H) 12/24/2023   Lab Results  Component Value Date   TSH 3.544 07/20/2022   TSH 1.300 05/16/2021    Therapeutic Level  Labs: No results found for: LITHIUM No results found for: VALPROATE No results found for: CBMZ  Current Medications: Current Outpatient Medications  Medication Sig Dispense Refill   atorvastatin  (LIPITOR) 20 MG tablet Take 1 tablet (20 mg total) by mouth at bedtime. 90 tablet 1   calcium  carbonate (ANTACID) 500 MG chewable tablet Chew 1 tablet by mouth 4 times daily as needed for indigestion/heartburn. (Patient not taking: Reported on 06/02/2024) 150 tablet 0   clindamycin  (CLEOCIN ) 300 MG capsule Take 1 capsule (300 mg total) by mouth 4 (four) times daily. X 7 days (Patient not taking: Reported on 06/24/2024) 28 capsule 0   cloNIDine  (CATAPRES ) 0.2 MG tablet Take 1 tablet (0.2 mg total) by mouth at bedtime. 45 tablet 0   cyclobenzaprine  (FLEXERIL ) 10 MG tablet Take 1 tablet (10 mg total) by mouth 2 (two) times daily as needed for muscle spasms. 60 tablet 1   desvenlafaxine  (PRISTIQ ) 25 MG 24 hr tablet Take 3 tablets (75 mg total) by mouth daily. 135 tablet 0   gabapentin  (NEURONTIN ) 600 MG tablet Take 1 tablet (600 mg total) by mouth in the morning, at noon, in the evening, and at bedtime. 180 tablet 0   hydrOXYzine  (ATARAX ) 50 MG tablet Take 1 tablet (50 mg total) by mouth 3 (three) times  daily as needed for anxiety. 135 tablet 0   hyoscyamine  (LEVSIN ) 0.125 MG tablet Take 1 tablet (0.125 mg total) by mouth every 6 (six) hours as needed for cramping. 30 tablet 3   mirtazapine  (REMERON  SOL-TAB) 45 MG disintegrating tablet Take 1 tablet (45 mg total) by mouth at bedtime. 30 tablet 2   mirtazapine  (REMERON  SOL-TAB) 45 MG disintegrating tablet Take 1 tablet (45 mg total) by mouth at bedtime. 45 tablet 0   ondansetron  (ZOFRAN ) 4 MG tablet Take 1 tablet (4 mg total) by mouth every 8 (eight) hours as needed for nausea or vomiting. 20 tablet 0   pantoprazole  (PROTONIX ) 40 MG tablet Take 1 tablet (40 mg total) by mouth 2 (two) times daily before a meal. 180 tablet 1   prochlorperazine  (COMPAZINE ) 5 MG tablet Take 1 tablet (5 mg total) by mouth every 8 (eight) hours as needed for nausea or vomiting. 30 tablet 0   promethazine  (PHENERGAN ) 25 MG tablet Take 1 tablet (25 mg total) by mouth every 8 (eight) hours as needed for nausea or vomiting. 60 tablet 1   propranolol  (INDERAL ) 10 MG tablet Take 1 tablet (10 mg total) by mouth 3 (three) times daily as needed for up to 12 days (anxiety, at the onset of symptoms). 36 tablet 0   valsartan  (DIOVAN ) 80 MG tablet Take 1 tablet (80 mg total) by mouth daily. **Please keep appointment for additional refills.** 90 tablet 1   vancomycin  (VANCOCIN ) 125 MG capsule Take 1 capsule (125 mg total) by mouth 4 (four) times daily. 112 capsule 0   No current facility-administered medications for this visit.    Objective  Psychiatric Specialty Exam: General Appearance: appears at stated age, casually dressed and groomed ***  Behavior: pleasant and cooperative ***  Psychomotor Activity: no psychomotor agitation or retardation noted ***  Eye Contact: fair *** Speech: normal amount, volume and fluency ***   Mood: euthymic *** Affect: congruent, pleasant and interactive ***  Thought Process: linear, goal directed, no circumstantial or tangential thought  process noted, no racing thoughts or flight of ideas *** Descriptions of Associations: intact ***  Thought Content Hallucinations: denies AH, VH , does not appear responding to  stimuli *** Delusions: no paranoia, delusions of control, grandeur, ideas of reference, thought broadcasting, and magical thinking *** Suicidal Thoughts: denies SI, intention, plan *** Homicidal Thoughts: denies HI, intention, plan ***  Alertness/Orientation: alert and fully oriented ***  Insight: fair*** Judgment: fair***  Memory: intact ***  Executive Functions  Concentration: intact *** Attention Span: fair *** Recall: intact *** Fund of Knowledge: fair ***  Physical Exam *** General: Pleasant, well-appearing ***. No acute distress. Pulmonary: Normal effort. No wheezing or rales. Skin: No obvious rash or lesions. Neuro: A&Ox3.No focal deficit.  Review of Systems *** No reported symptoms  Screenings: AIMS    Flowsheet Row [facility]  AIMS Total Score 0   AUDIT    Flowsheet Row [facility]  Alcohol  Use Disorder Identification Test Final Score (AUDIT) 6 11 0   GAD-7    Flowsheet Row Office Visit from 06/24/2024 in [facility] [place] [facility] [place] Office Visit from 04/14/2024 in [facility] Office Visit from 12/24/2023 in [facility] [place] [facility] Dept Of [place]. [facility] Office Visit from 06/12/2023 in [facility] [place] - A Dept Of [place] [facility]. [facility] Clinical Support from 06/04/2023 in [facility]  Total GAD-7 Score 14 14 5 3 12     PHQ2-9    Flowsheet Row Office Visit from 06/24/2024 in [facility] [place] [facility] A Dept Of [place]. [facility] Office Visit from 04/14/2024 in [facility] Office Visit from 12/24/2023 in [facility] [place] - A Dept Of [place]. [facility] Office Visit from 06/12/2023 in [facility] [place] - A Dept Of [facility]. [facility] Clinical Support from 06/04/2023 in [facility]  PHQ-2 Total Score 3 3 0 0 0  PHQ-9 Total Score 9 12 6  -- 10   Flowsheet Row Office Visit from 04/14/2024 in [facility] ED from 03[facility]10/2024 in [facility] Emergency Department at [facility] ED from 11/25/2023 in [facility] Emergency Department at [facility]  C-SSRS RISK CATEGORY Moderate Risk No Risk No Risk    Ismael Franco, MD PGY-3 Psychiatry Resident

## 2024-07-23 ENCOUNTER — Other Ambulatory Visit (HOSPITAL_BASED_OUTPATIENT_CLINIC_OR_DEPARTMENT_OTHER): Payer: Self-pay

## 2024-07-23 ENCOUNTER — Other Ambulatory Visit: Payer: Self-pay

## 2024-07-29 ENCOUNTER — Telehealth (HOSPITAL_COMMUNITY): Payer: Self-pay | Admitting: Psychiatry

## 2024-07-29 ENCOUNTER — Encounter (HOSPITAL_COMMUNITY): Payer: Self-pay | Admitting: Psychiatry

## 2024-07-29 NOTE — Telephone Encounter (Signed)
 Patient did not show up for the appointment.  Attempted to call the patient but received no response. Left a HIPAA compliant voicemail for patient to reschedule.   Ismael Franco, MD PGY-3 Psychiatry Resident

## 2024-07-31 ENCOUNTER — Other Ambulatory Visit (HOSPITAL_BASED_OUTPATIENT_CLINIC_OR_DEPARTMENT_OTHER): Payer: Self-pay

## 2024-07-31 ENCOUNTER — Other Ambulatory Visit: Payer: Self-pay | Admitting: Family Medicine

## 2024-08-01 ENCOUNTER — Telehealth (HOSPITAL_COMMUNITY): Payer: Self-pay

## 2024-08-01 ENCOUNTER — Encounter: Payer: Self-pay | Admitting: Family Medicine

## 2024-08-01 ENCOUNTER — Other Ambulatory Visit (HOSPITAL_BASED_OUTPATIENT_CLINIC_OR_DEPARTMENT_OTHER): Payer: Self-pay

## 2024-08-01 ENCOUNTER — Other Ambulatory Visit: Payer: Self-pay

## 2024-08-01 DIAGNOSIS — F3341 Major depressive disorder, recurrent, in partial remission: Secondary | ICD-10-CM

## 2024-08-01 DIAGNOSIS — R110 Nausea: Secondary | ICD-10-CM

## 2024-08-01 DIAGNOSIS — I10 Essential (primary) hypertension: Secondary | ICD-10-CM

## 2024-08-01 DIAGNOSIS — F411 Generalized anxiety disorder: Secondary | ICD-10-CM

## 2024-08-01 MED ORDER — DESVENLAFAXINE SUCCINATE ER 25 MG PO TB24
75.0000 mg | ORAL_TABLET | Freq: Every day | ORAL | 0 refills | Status: DC
  Filled 2024-08-01 – 2024-08-25 (×2): qty 90, 30d supply, fill #0

## 2024-08-01 MED ORDER — PROPRANOLOL HCL 10 MG PO TABS
10.0000 mg | ORAL_TABLET | Freq: Three times a day (TID) | ORAL | 0 refills | Status: DC | PRN
  Filled 2024-08-01 – 2024-08-11 (×4): qty 90, 30d supply, fill #0

## 2024-08-01 MED ORDER — PROMETHAZINE HCL 25 MG PO TABS
25.0000 mg | ORAL_TABLET | Freq: Three times a day (TID) | ORAL | 1 refills | Status: DC | PRN
  Filled 2024-08-01: qty 60, 20d supply, fill #0
  Filled 2024-08-16: qty 60, 20d supply, fill #1

## 2024-08-01 MED ORDER — HYDROXYZINE HCL 50 MG PO TABS
50.0000 mg | ORAL_TABLET | Freq: Three times a day (TID) | ORAL | 0 refills | Status: DC | PRN
Start: 2024-08-01 — End: 2024-08-18
  Filled 2024-08-01 (×2): qty 90, 30d supply, fill #0

## 2024-08-01 MED ORDER — GABAPENTIN 600 MG PO TABS
600.0000 mg | ORAL_TABLET | Freq: Four times a day (QID) | ORAL | 0 refills | Status: DC
  Filled 2024-08-01 – 2024-08-15 (×3): qty 120, 30d supply, fill #0

## 2024-08-01 MED ORDER — MIRTAZAPINE 45 MG PO TBDP
45.0000 mg | ORAL_TABLET | Freq: Every day | ORAL | 0 refills | Status: DC
  Filled 2024-08-01 – 2024-08-11 (×4): qty 30, 30d supply, fill #0

## 2024-08-01 MED ORDER — CLONIDINE HCL 0.2 MG PO TABS
0.2000 mg | ORAL_TABLET | Freq: Every day | ORAL | 0 refills | Status: DC
  Filled 2024-08-01 – 2024-08-05 (×2): qty 30, 30d supply, fill #0

## 2024-08-01 NOTE — Telephone Encounter (Signed)
 Received refill request from pharmacy for this patient's Pristiq , Remeron , gabapentin , hydroxyzine , propranolol , and clonidine . I sent in the medication to bridge patient for the next appointment on 12/5.  Ismael Franco, MD PGY-3 Psychiatry Resident

## 2024-08-05 ENCOUNTER — Ambulatory Visit: Payer: Self-pay | Attending: Family Medicine | Admitting: Family Medicine

## 2024-08-05 ENCOUNTER — Encounter: Payer: Self-pay | Admitting: Family Medicine

## 2024-08-05 ENCOUNTER — Other Ambulatory Visit (HOSPITAL_BASED_OUTPATIENT_CLINIC_OR_DEPARTMENT_OTHER): Payer: Self-pay

## 2024-08-05 ENCOUNTER — Telehealth: Payer: Self-pay

## 2024-08-05 ENCOUNTER — Other Ambulatory Visit: Payer: Self-pay

## 2024-08-05 ENCOUNTER — Other Ambulatory Visit (HOSPITAL_COMMUNITY)
Admission: RE | Admit: 2024-08-05 | Discharge: 2024-08-05 | Disposition: A | Discharge status: Home / Self Care | Source: Ambulatory Visit | Attending: Family Medicine | Admitting: Family Medicine

## 2024-08-05 VITALS — BP 110/66 | HR 77 | Temp 98.3°F | Ht 63.0 in | Wt 147.6 lb

## 2024-08-05 DIAGNOSIS — Z0001 Encounter for general adult medical examination with abnormal findings: Secondary | ICD-10-CM | POA: Diagnosis not present

## 2024-08-05 DIAGNOSIS — R1012 Left upper quadrant pain: Secondary | ICD-10-CM

## 2024-08-05 DIAGNOSIS — Z1211 Encounter for screening for malignant neoplasm of colon: Secondary | ICD-10-CM

## 2024-08-05 DIAGNOSIS — Z124 Encounter for screening for malignant neoplasm of cervix: Secondary | ICD-10-CM

## 2024-08-05 DIAGNOSIS — Z1231 Encounter for screening mammogram for malignant neoplasm of breast: Secondary | ICD-10-CM

## 2024-08-05 MED ORDER — METHOCARBAMOL 500 MG PO TABS
500.0000 mg | ORAL_TABLET | Freq: Three times a day (TID) | ORAL | 2 refills | Status: DC | PRN
Start: 2024-08-05 — End: 2024-08-18
  Filled 2024-08-05: qty 90, 30d supply, fill #0

## 2024-08-05 NOTE — Telephone Encounter (Signed)
 Noted

## 2024-08-05 NOTE — Telephone Encounter (Signed)
 Dr Delbert- when the patient was leaving, she said she wanted to ask you if you could order Soma instead of robaxin  for the muscle spasms.

## 2024-08-05 NOTE — Progress Notes (Signed)
 Subjective:  Patient ID: Margaret Mccann, female    DOB: [birth year]  Age: 53 y.o. MRN: [id number]  CC: Annual Exam (Anxiety/Not sleeping) and Gynecologic Exam     Discussed the use of AI scribe software for clinical note transcription with the patient, who gave verbal consent to proceed.  History of Present Illness Margaret Mccann is a 53 year old female with a history of PTSD, major depression, severe anxiety, previous suicidal attempts, substance abuse (nicotine , cocaine, alcohol ), and hypertension, hyperlipidemia, pancreatitis and Clostridium difficile infection who presents for a physical exam and cancer screenings.  She was recently treated for a Clostridioides difficile infection and completed her course of vancomycin . She is taking probiotics and requires a referral for a colonoscopy once she tests negative for C. diff.  She experiences ongoing gastrointestinal issues, including left upper quadrant and back pain, diarrhea, and oily stools. A CT scan in June showed a hiatal hernia but no other abnormal findings. She smokes three to five cigarettes daily and is not ready to quit. She experiences poor sleep due to pain and nausea, with Phenergan  providing more relief than Zofran . Anxiety related to an impending move and potential house loss is present. She is seeking support from the [employer].  She maintains a good intake of fruits and vegetables but feels her body is not absorbing nutrients properly. She experiences frequent thirst and drinks water  throughout the day. She uses Imodium  for diarrhea and simethicone, which she believes affects her symptoms.    Past Medical History:  Diagnosis Date   Alcohol  withdrawal seizure with complication, with unspecified complication (HCC) 12/23/2020   Anxiety    Bipolar 1 disorder (HCC)    C. difficile colitis    Depression    GERD (gastroesophageal reflux disease)    HLD (hyperlipidemia)    HTN (hypertension)     Intentional drug overdose (HCC) 08/04/2020   Major depressive disorder, recurrent episode with anxious distress 08/11/2020   Morgellons syndrome    MRSA (methicillin resistant Staphylococcus aureus)    Suicide attempt (HCC)    Suicide by drug overdose (HCC) 08/11/2020   Tricuspid valve regurgitation 12/18/2020    Past Surgical History:  Procedure Laterality Date   APPENDECTOMY      Family History  Problem Relation Age of Onset   Heart Problems Mother        abnormal beats   Skin cancer Mother    Heart attack Father 77   Stroke Father    Diabetes Father    Severe combined immunodeficiency Sister    Hypertension Paternal Grandfather    COPD Paternal Grandfather    Breast cancer Paternal Aunt    Ulcerative colitis Neg Hx    Esophageal cancer Neg Hx     Social History   Socioeconomic History   Marital status: Single    Spouse name: Not on file   Number of children: 2   Years of education: Not on file   Highest education level: Bachelor's degree (e.g., BA, AB, BS)  Occupational History   Not on file  Tobacco Use   Smoking status: Some Days    Current packs/day: 0.25    Average packs/day: 0.3 packs/day for 5.0 years (1.3 ttl pk-yrs)    Types: Cigarettes   Smokeless tobacco: Never  Vaping Use   Vaping status: Never Used  Substance and Sexual Activity   Alcohol  use: Not Currently    Comment: occas   Drug use: Not Currently    Types: Cocaine  Comment: relapsed the past weekend, no cocaine use prior 6 months   Sexual activity: Not Currently    Birth control/protection: Pill  Other Topics Concern   Not on file  Social History Narrative   ** Merged History Encounter **       Social Drivers of Health   Financial Resource Strain: High Risk (03/20/2024)   Overall Financial Resource Strain (CARDIA)    Difficulty of Paying Living Expenses: Hard  Food Insecurity: Low Risk  (05/25/2024)   Received from [facility]   Hunger Vital Sign    Within the past 12 months,  you worried that your food would run out before you got money to buy more: Never true    Within the past 12 months, the food you bought just didn't last and you didn't have money to get more. : Never true  Recent Concern: Food Insecurity - Food Insecurity Present (03/20/2024)   Hunger Vital Sign    Worried About Running Out of Food in the Last Year: Sometimes true    Ran Out of Food in the Last Year: Sometimes true  Transportation Needs: No Transportation Needs (05/25/2024)   Received from [facility]    In the past 12 months, has lack of reliable transportation kept you from medical appointments, meetings, work or from getting things needed for daily living? : No  Physical Activity: Insufficiently Active (03/20/2024)   Exercise Vital Sign    Days of Exercise per Week: 1 day    Minutes of Exercise per Session: 20 min  Stress: Stress Concern Present (03/20/2024)   [employer] of Occupational Health - Occupational Stress Questionnaire    Feeling of Stress: Very much  Social Connections: Socially Isolated (03/20/2024)   Social Connection and Isolation Panel    Frequency of Communication with Friends and Family: More than three times a week    Frequency of Social Gatherings with Friends and Family: More than three times a week    Attends Religious Services: Never    Database Administrator or Organizations: No    Attends Engineer, Structural: Not on file    Marital Status: Divorced    Allergies  Allergen Reactions   Lamictal [Lamotrigine] Anaphylaxis, Rash and Other (See Comments)    Stevens-Johnson syndrome and fevers, also   Lamictal [Lamotrigine] Anaphylaxis, Rash and Other (See Comments)    Fevers and Stevens-Johnson syndrome, also   Tramadol  Other (See Comments)    Pt had a seizure after taking Tramadol !!   Erythromycin Nausea And Vomiting   Lamisil  [Terbinafine ] Nausea And Vomiting   Geodon  [Ziprasidone  Hcl] Rash   Levetiracetam Anxiety     Outpatient Medications Prior to Visit  Medication Sig Dispense Refill   atorvastatin  (LIPITOR) 20 MG tablet Take 1 tablet (20 mg total) by mouth at bedtime. 90 tablet 1   cloNIDine  (CATAPRES ) 0.2 MG tablet Take 1 tablet (0.2 mg total) by mouth at bedtime. 30 tablet 0   desvenlafaxine  (PRISTIQ ) 25 MG 24 hr tablet Take 3 tablets (75 mg total) by mouth daily. 90 tablet 0   gabapentin  (NEURONTIN ) 600 MG tablet Take 1 tablet (600 mg total) by mouth in the morning, at noon, in the evening, and at bedtime. 120 tablet 0   hydrOXYzine  (ATARAX ) 50 MG tablet Take 1 tablet (50 mg total) by mouth 3 (three) times daily as needed for anxiety. 90 tablet 0   hyoscyamine  (LEVSIN ) 0.125 MG tablet Take 1 tablet (0.125 mg total) by mouth every  6 (six) hours as needed for cramping. 30 tablet 3   mirtazapine  (REMERON  SOL-TAB) 45 MG disintegrating tablet Take 1 tablet (45 mg total) by mouth at bedtime. 30 tablet 0   pantoprazole  (PROTONIX ) 40 MG tablet Take 1 tablet (40 mg total) by mouth 2 (two) times daily before a meal. 180 tablet 1   promethazine  (PHENERGAN ) 25 MG tablet Take 1 tablet (25 mg total) by mouth every 8 (eight) hours as needed for nausea or vomiting. 60 tablet 1   propranolol  (INDERAL ) 10 MG tablet Take 1 tablet (10 mg total) by mouth 3 (three) times daily as needed (anxiety, at the onset of symptoms). 90 tablet 0   valsartan  (DIOVAN ) 80 MG tablet Take 1 tablet (80 mg total) by mouth daily. **Please keep appointment for additional refills.** 90 tablet 1   cyclobenzaprine  (FLEXERIL ) 10 MG tablet Take 1 tablet (10 mg total) by mouth 2 (two) times daily as needed for muscle spasms. 60 tablet 1   calcium  carbonate (ANTACID) 500 MG chewable tablet Chew 1 tablet by mouth 4 times daily as needed for indigestion/heartburn. (Patient not taking: Reported on 06/02/2024) 150 tablet 0   clindamycin  (CLEOCIN ) 300 MG capsule Take 1 capsule (300 mg total) by mouth 4 (four) times daily. X 7 days (Patient not taking:  Reported on 08/05/2024) 28 capsule 0   ondansetron  (ZOFRAN ) 4 MG tablet Take 1 tablet (4 mg total) by mouth every 8 (eight) hours as needed for nausea or vomiting. (Patient not taking: Reported on 08/05/2024) 20 tablet 0   vancomycin  (VANCOCIN ) 125 MG capsule Take 1 capsule (125 mg total) by mouth 4 (four) times daily. (Patient not taking: Reported on 08/05/2024) 112 capsule 0   No facility-administered medications prior to visit.     ROS Review of Systems  Constitutional:  Negative for activity change and appetite change.  HENT:  Negative for sinus pressure and sore throat.   Respiratory:  Negative for chest tightness, shortness of breath and wheezing.   Cardiovascular:  Negative for chest pain and palpitations.  Gastrointestinal:  Positive for abdominal pain, diarrhea and nausea. Negative for abdominal distention and constipation.  Genitourinary: Negative.   Musculoskeletal: Negative.   Psychiatric/Behavioral:  Negative for behavioral problems and dysphoric mood.     Objective:  BP 110/66   Pulse 77   Temp 98.3 F (36.8 C) (Oral)   Ht 5' 3 (1.6 m)   Wt 147 lb 9.6 oz (67 kg)   LMP 10/27/2019   SpO2 98%   BMI 26.15 kg/m      08/05/2024    3:09 PM 06/26/2024    3:12 PM 06/24/2024   10:41 AM  BP/Weight  Systolic BP 110  877  Diastolic BP 66  81  Wt. (Lbs) 147.6  152.4  BMI 26.15 kg/m2  27 kg/m2     Information is confidential and restricted. Go to Review Flowsheets to unlock data.      Physical Exam Exam conducted with a chaperone present.  Constitutional:      General: She is not in acute distress.    Appearance: She is well-developed. She is not diaphoretic.  HENT:     Head: Normocephalic.     Right Ear: External ear normal.     Left Ear: External ear normal.     Nose: Nose normal.  Eyes:     Conjunctiva/sclera: Conjunctivae normal.     Pupils: Pupils are equal, round, and reactive to light.  Neck:     Vascular: No JVD.  Cardiovascular:     Rate and  Rhythm: Normal rate and regular rhythm.     Heart sounds: Normal heart sounds. No murmur heard.    No gallop.  Pulmonary:     Effort: Pulmonary effort is normal. No respiratory distress.     Breath sounds: Normal breath sounds. No wheezing or rales.  Chest:     Chest wall: No tenderness.  Breasts:    Right: Normal. No mass, nipple discharge or tenderness.     Left: Normal. No mass, nipple discharge or tenderness.  Abdominal:     General: Bowel sounds are normal. There is no distension.     Palpations: Abdomen is soft. There is no mass.     Tenderness: There is no abdominal tenderness.     Hernia: There is no hernia in the left inguinal area or right inguinal area.  Genitourinary:    General: Normal vulva.     Pubic Area: No rash.      Labia:        Right: No rash.        Left: No rash.      Vagina: Normal.     Cervix: Normal.     Uterus: Normal.      Adnexa: Right adnexa normal and left adnexa normal.       Right: No tenderness.         Left: No tenderness.    Musculoskeletal:        General: No tenderness. Normal range of motion.     Cervical back: Normal range of motion. No tenderness.     Right lower leg: No edema.     Left lower leg: No edema.  Lymphadenopathy:     Upper Body:     Right upper body: No supraclavicular or axillary adenopathy.     Left upper body: No supraclavicular or axillary adenopathy.  Skin:    General: Skin is warm and dry.  Neurological:     Mental Status: She is alert and oriented to person, place, and time.     Deep Tendon Reflexes: Reflexes are normal and symmetric.  Psychiatric:        Mood and Affect: Mood normal.        Latest Ref Rng & Units 06/10/2024    1:04 PM 06/02/2024    3:41 PM 05/05/2024    2:38 PM  CMP  Glucose 70 - 99 mg/dL  92  89   BUN 6 - 23 mg/dL  14  16   Creatinine 9.59 - 1.20 mg/dL  9.24  9.31   Sodium 864 - 145 mEq/L  134  134   Potassium 3.5 - 5.1 mEq/L  4.4  5.0   Chloride 96 - 112 mEq/L  101  101   CO2 19 - 32  mEq/L  26  25   Calcium  8.4 - 10.5 mg/dL  9.8  9.8   Total Protein 6.0 - 8.3 g/dL 7.0  7.7  7.2   Total Bilirubin 0.2 - 1.2 mg/dL 0.2  0.3  0.4   Alkaline Phos 39 - 117 U/L 86  95  93   AST 0 - 37 U/L 41  60  35   ALT 0 - 35 U/L 102  110  68     Lipid Panel     Component Value Date/Time   CHOL 199 06/24/2024 1128   TRIG 218 (H) 06/24/2024 1128   HDL 68 06/24/2024 1128   CHOLHDL 3.4 07/20/2022 0706  VLDL 23 07/20/2022 0706   LDLCALC 95 06/24/2024 1128    CBC    Component Value Date/Time   WBC 6.0 06/02/2024 1541   RBC 3.85 (L) 06/02/2024 1541   HGB 11.9 (L) 06/02/2024 1541   HGB 11.1 12/24/2023 1151   HCT 36.0 06/02/2024 1541   HCT 32.7 (L) 12/24/2023 1151   PLT 396.0 06/02/2024 1541   PLT 297 12/24/2023 1151   MCV 93.3 06/02/2024 1541   MCV 89 12/24/2023 1151   MCH 30.1 03/10/2024 2047   MCHC 33.0 06/02/2024 1541   RDW 15.3 06/02/2024 1541   RDW 13.7 12/24/2023 1151   LYMPHSABS 1.6 06/02/2024 1541   LYMPHSABS 1.8 12/24/2023 1151   MONOABS 0.8 06/02/2024 1541   EOSABS 0.1 06/02/2024 1541   EOSABS 0.1 12/24/2023 1151   BASOSABS 0.0 06/02/2024 1541   BASOSABS 0.0 12/24/2023 1151    Lab Results  Component Value Date   HGBA1C 5.7 (H) 12/24/2023      1. Annual visit for general adult medical examination with abnormal findings (Primary) Counseled on 150 minutes of exercise per week, healthy eating (including decreased daily intake of saturated fats, cholesterol, added sugars, sodium),routine healthcare maintenance.   2. Screening for colon cancer - Ambulatory referral to Gastroenterology  3. Screening for cervical cancer - Cytology - PAP  4. Encounter for screening mammogram for malignant neoplasm of breast - MM 3D SCREENING MAMMOGRAM BILATERAL BREAST; Future  5. Left upper quadrant abdominal pain History of pancreatitis Patient states she has stopped drinking - methocarbamol  (ROBAXIN ) 500 MG tablet; Take 1 tablet (500 mg total) by mouth every 8 (eight)  hours as needed for muscle spasms.  Dispense: 90 tablet; Refill: 2     Meds ordered this encounter  Medications   methocarbamol  (ROBAXIN ) 500 MG tablet    Sig: Take 1 tablet (500 mg total) by mouth every 8 (eight) hours as needed for muscle spasms.    Dispense:  90 tablet    Refill:  2    Discontinue Flexeril     Follow-up: Return in about 6 months (around 02/02/2025) for Chronic medical conditions.       Corrina Sabin, MD, FAAFP. [facility] [place], [place] [phone]   08/05/2024, 3:41 PM

## 2024-08-05 NOTE — Patient Instructions (Signed)
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
## 2024-08-06 ENCOUNTER — Telehealth (HOSPITAL_COMMUNITY): Payer: Self-pay | Admitting: Licensed Clinical Social Worker

## 2024-08-06 ENCOUNTER — Telehealth: Payer: Self-pay

## 2024-08-06 ENCOUNTER — Telehealth (HOSPITAL_COMMUNITY): Payer: Self-pay | Admitting: Student in an Organized Health Care Education/Training Program

## 2024-08-06 ENCOUNTER — Telehealth (HOSPITAL_COMMUNITY): Payer: Self-pay | Admitting: Professional

## 2024-08-06 ENCOUNTER — Ambulatory Visit (HOSPITAL_BASED_OUTPATIENT_CLINIC_OR_DEPARTMENT_OTHER): Payer: Self-pay | Admitting: Family Medicine

## 2024-08-06 ENCOUNTER — Encounter (HOSPITAL_COMMUNITY): Payer: Self-pay | Admitting: Licensed Clinical Social Worker

## 2024-08-06 ENCOUNTER — Other Ambulatory Visit (HOSPITAL_BASED_OUTPATIENT_CLINIC_OR_DEPARTMENT_OTHER): Payer: Self-pay

## 2024-08-06 ENCOUNTER — Ambulatory Visit (INDEPENDENT_AMBULATORY_CARE_PROVIDER_SITE_OTHER): Payer: Self-pay | Admitting: Licensed Clinical Social Worker

## 2024-08-06 DIAGNOSIS — F332 Major depressive disorder, recurrent severe without psychotic features: Secondary | ICD-10-CM | POA: Diagnosis not present

## 2024-08-06 DIAGNOSIS — F411 Generalized anxiety disorder: Secondary | ICD-10-CM

## 2024-08-06 DIAGNOSIS — F4310 Post-traumatic stress disorder, unspecified: Secondary | ICD-10-CM

## 2024-08-06 DIAGNOSIS — F1491 Cocaine use, unspecified, in remission: Secondary | ICD-10-CM | POA: Insufficient documentation

## 2024-08-06 LAB — CYTOLOGY - PAP
Adequacy: ABSENT
Comment: NEGATIVE
Diagnosis: NEGATIVE
High risk HPV: NEGATIVE

## 2024-08-06 NOTE — Telephone Encounter (Addendum)
 Reason for Call Informed by staff patient was requesting to speak with a provider. Called and spoke with patient, who is requesting Seroquel  for worsening depression and insomnia, reporting ongoing psychosocial stressors and feeling in crisis. Patient is currently under the care of  Dr. Izella; I am covering her inbox.  Current Medications Pristiq  75 mg daily Remeron  45 mg at bedtime Gabapentin  600 mg four times daily Atarax  50 mg TID PRN Propranolol  10 mg TID PRN  History/Subjective Patient reports persistent and worsening symptoms of depression and insomnia despite compliance with the above regimen. She is insistent that Seroquel  is necessary to address her symptoms. She describes significant psychosocial stressors and states something bad will happen if she does not get this medication. She denies SI/HI/AVH. Patient reports she went to urgent care but left before being evaluated by a provider. She reported she used some of her mother's prescribed seroquel  and is certain this is the medication she needs.   Assessment and Plan The patient is experiencing worsening depressive symptoms and insomnia despite compliance with a complex psychotropic regimen. She is requesting Seroquel , but given her current medications and lack of acute evaluation, this is not clinically indicated. I provided extensive psychoeducation regarding the risks of adding Seroquel  and advised that medication changes are unlikely to provide immediate relief. I strongly recommended she present to the ED or Behavioral Health Urgent Care if her symptoms worsen or she feels unsafe. No medication changes were made, and the primary provider will be updated.  Marck Mcclenny Carrin Carrero, MD PGY-3, [facility]

## 2024-08-06 NOTE — Telephone Encounter (Signed)
 Called and spoke with patient back at 4:00 PM this afternoon. She reports she spoke with the front desk staff and that she will be presenting for walk in hours tomorrow morning for further medication management.    Aarti Mankowski Carrin Carrero, MD PGY-3, [facility]

## 2024-08-06 NOTE — Telephone Encounter (Signed)
 See call log
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
## 2024-08-06 NOTE — Telephone Encounter (Signed)
 Margaret Mccann, CMA and I met with the patient when she presented to this clinic today requesting seroquel .   She was fidgeting while standing and speaking with us  and appeared anxious. She stated she is not sleeping and needs to be able to sleep to address the challenges she is currently facing: finding a job, moving from her current home and securing a new place to live. She denied suicidal thoughts and said that she is trying to avoid suicidal thoughts.   She stated she went to see her behavioral health therapist today. When asked if she spoke to the therapist about the request for seroquel , she said that the therapist does not prescribe medications, she doesn't see her psychiatrist until 08/29/2024 and her psychiatrist is off and there is no one to prescribe it for her. She said they instructed her to see her PCP. I asked her about going to [facility] and she said they don't prescribe medications there.  I told her that we would speak with Dr Newlin.   Patient's request for the seroquel  was shared with Dr Newlin who recommended that the patient follow up with psychiatry for that request as she will not be prescribing it.    We shared Dr Millard response with the patient and instructed her to follow up with psychiatry as there should be a provider covering for her psychiatrist who is off .  She continued to fidget while sitting in the chair and continued to appear anxious.  She was appreciative that we spoke to Dr Delbert.  We encouraged her to go to [facility] or the ED if she is not able to manage her anxiety and to call 911 if she develops any suicidal thoughts. Again she denied any suicidal thoughts and stated she is trying to prevent that.    She did not voice any other concerns and she left the clinic.

## 2024-08-07 ENCOUNTER — Other Ambulatory Visit (HOSPITAL_COMMUNITY): Payer: Self-pay

## 2024-08-07 ENCOUNTER — Other Ambulatory Visit: Payer: Self-pay

## 2024-08-07 ENCOUNTER — Other Ambulatory Visit (HOSPITAL_BASED_OUTPATIENT_CLINIC_OR_DEPARTMENT_OTHER): Payer: Self-pay

## 2024-08-07 ENCOUNTER — Ambulatory Visit (HOSPITAL_COMMUNITY): Admitting: Physician Assistant

## 2024-08-07 VITALS — BP 118/77 | HR 77 | Temp 97.9°F | Ht 63.0 in | Wt 150.0 lb

## 2024-08-07 DIAGNOSIS — F4310 Post-traumatic stress disorder, unspecified: Secondary | ICD-10-CM

## 2024-08-07 DIAGNOSIS — I10 Essential (primary) hypertension: Secondary | ICD-10-CM

## 2024-08-07 DIAGNOSIS — F3341 Major depressive disorder, recurrent, in partial remission: Secondary | ICD-10-CM

## 2024-08-07 DIAGNOSIS — F411 Generalized anxiety disorder: Secondary | ICD-10-CM

## 2024-08-07 MED ORDER — QUETIAPINE FUMARATE 50 MG PO TABS
50.0000 mg | ORAL_TABLET | Freq: Every day | ORAL | 0 refills | Status: DC
  Filled 2024-08-07 (×2): qty 22, 22d supply, fill #0

## 2024-08-07 NOTE — Progress Notes (Cosign Needed)
 BH MD/PA/NP OP Progress Note  08/07/2024 8:00 AM Margaret Mccann  MRN:  [id number]  Chief Complaint:  Chief Complaint  Patient presents with   Follow-up   Medication Management   HPI:   Margaret Mccann is a 53 year old female with a past psychiatric history significant for generalized anxiety disorder, major depressive disorder (recurrent episode, in partial remission, with anxious distress) and PTSD who presents to [facility] for follow-up and medication management.  Patient was last seen by Ismael B. Izella, MD on 06/26/2024.  During her last encounter, patient was being managed on the following psychiatric medications:  Pristiq  75 mg daily Mirtazapine  45 mg at bedtime Gabapentin  600 mg 4 times daily Buspirone  5 mg 3 times daily Hydroxyzine  50 mg 3 times daily as needed  Patient presents to the encounter stating that she does not want to change any of her medications.  She reports that her stepfather was recently diagnosed with a third type of cancer and she has to move out of the house by January 15.  She reports that she is currently not sleeping and receives roughly 2 to 4 hours of sleep at night.  She reports that she needs extra help with sleep so that she can go back to sleeping 8 to 9 hours per night.  She reports having intrusive/worrying thoughts.  She is requesting to be placed on Klonopin  to help with her intrusive/worrying thoughts.  She reports that she has been placed on this medication in the past.  She endorses anxiety directly related to her lack of sleep.  Without sleep, patient reports that she is unable to function properly.  Patient endorses anxiety and rates her anxiety an 8 out of 10.  Patient's current stressors include trying to take care of her mother and looking for work.  Patient endorses depression due to lack of sleep.  She reports that she has been more sensitive to things due to inability to sleep.   Patient rates her depression a 5-6 out of 10 with 10 being most severe.  Patient denies being suicidal and denies wanting to drink alcohol .  Patient endorses depressive episodes 3 days/week.  Patient endorses the following depressive symptoms: feelings of sadness, crying spells, decreased concentration, decreased energy, decreased sleep, and irritability.  Patient denies lack of motivation, feelings of guilt/worthlessness, or hopelessness.  A PHQ-9 screen was performed with the patient scoring an 11.  A GAD-7 screen was also performed with the patient scoring a 16.  Patient is alert and oriented x 4, calm, cooperative, and fully engaged in conversation during the encounter.  Patient describes herself as being exhausted.  Patient exhibits depressed mood with anxious affect.  Patient denies suicidal or homicidal ideations.  She further denies auditory or visual hallucinations and does not appear to be responding to internal/external stimuli.  Patient endorses poor sleep and receives on average 2 to 4 hours of sleep per night.  Patient endorses fair appetite and eats on average 1-2 meals per day.  Patient denies alcohol  consumption.  Patient endorses tobacco use and smokes on average 1/2 pack/day.  Patient denies illicit drug use.  Visit Diagnosis:    ICD-10-CM   1. GAD (generalized anxiety disorder)  F41.1     2. Major depressive disorder, recurrent episode, in partial remission with anxious distress  F33.41 QUEtiapine  (SEROQUEL ) 50 MG tablet    3. Primary hypertension  I10     4. PTSD (post-traumatic stress disorder)  F43.10  Past Psychiatric History:  Major depressive disorder Anxiety PTSD Substance abuse (cocaine, alcohol )  Past Medical History:  Past Medical History:  Diagnosis Date   Alcohol  withdrawal seizure with complication, with unspecified complication (HCC) 12/23/2020   Anxiety    Bipolar 1 disorder (HCC)    C. difficile colitis    Depression    GERD (gastroesophageal  reflux disease)    HLD (hyperlipidemia)    HTN (hypertension)    Intentional drug overdose (HCC) 08/04/2020   Major depressive disorder, recurrent episode with anxious distress 08/11/2020   Morgellons syndrome    MRSA (methicillin resistant Staphylococcus aureus)    Suicide attempt (HCC)    Suicide by drug overdose (HCC) 08/11/2020   Tricuspid valve regurgitation 12/18/2020    Past Surgical History:  Procedure Laterality Date   APPENDECTOMY      Family Psychiatric History:  Sister - committed suicide in 2005 by overdosing   Family History:  Family History  Problem Relation Age of Onset   Heart Problems Mother        abnormal beats   Skin cancer Mother    Heart attack Father 75   Stroke Father    Diabetes Father    Severe combined immunodeficiency Sister    Hypertension Paternal Grandfather    COPD Paternal Grandfather    Breast cancer Paternal Aunt    Ulcerative colitis Neg Hx    Esophageal cancer Neg Hx     Social History:  Social History   Socioeconomic History   Marital status: Single    Spouse name: Not on file   Number of children: 2   Years of education: Not on file   Highest education level: Bachelor's degree (e.g., BA, AB, BS)  Occupational History   Not on file  Tobacco Use   Smoking status: Every Day    Current packs/day: 0.25    Average packs/day: 0.3 packs/day for 5.0 years (1.3 ttl pk-yrs)    Types: Cigarettes   Smokeless tobacco: Never  Vaping Use   Vaping status: Never Used  Substance and Sexual Activity   Alcohol  use: Not Currently    Comment: occas   Drug use: Not Currently    Types: Cocaine    Comment: Hx of drug abuse   Sexual activity: Not Currently    Birth control/protection: Pill  Other Topics Concern   Not on file  Social History Narrative   ** Merged History Encounter **       Social Drivers of Health   Financial Resource Strain: High Risk (08/06/2024)   Overall Financial Resource Strain (CARDIA)    Difficulty of Paying  Living Expenses: Very hard  Food Insecurity: Low Risk  (05/25/2024)   Received from [facility]   Hunger Vital Sign    Within the past 12 months, you worried that your food would run out before you got money to buy more: Never true    Within the past 12 months, the food you bought just didn't last and you didn't have money to get more. : Never true  Recent Concern: Food Insecurity - Food Insecurity Present (03/20/2024)   Hunger Vital Sign    Worried About Running Out of Food in the Last Year: Sometimes true    Ran Out of Food in the Last Year: Sometimes true  Transportation Needs: No Transportation Needs (05/25/2024)   Received from [facility]    In the past 12 months, has lack of reliable transportation kept you from medical appointments, meetings,  work or from getting things needed for daily living? : No  Physical Activity: Insufficiently Active (03/20/2024)   Exercise Vital Sign    Days of Exercise per Week: 1 day    Minutes of Exercise per Session: 20 min  Stress: Stress Concern Present (08/06/2024)   [employer] of Occupational Health - Occupational Stress Questionnaire    Feeling of Stress: Very much  Social Connections: Socially Isolated (03/20/2024)   Social Connection and Isolation Panel    Frequency of Communication with Friends and Family: More than three times a week    Frequency of Social Gatherings with Friends and Family: More than three times a week    Attends Religious Services: Never    Database Administrator or Organizations: No    Attends Engineer, Structural: Not on file    Marital Status: Divorced    Allergies:  Allergies  Allergen Reactions   Lamictal [Lamotrigine] Anaphylaxis, Rash and Other (See Comments)    Stevens-Johnson syndrome and fevers, also   Lamictal [Lamotrigine] Anaphylaxis, Rash and Other (See Comments)    Fevers and Stevens-Johnson syndrome, also   Tramadol  Other (See Comments)    Pt had a seizure after  taking Tramadol !!   Erythromycin Nausea And Vomiting   Lamisil  [Terbinafine ] Nausea And Vomiting   Geodon  [Ziprasidone  Hcl] Rash   Levetiracetam Anxiety    Metabolic Disorder Labs: Lab Results  Component Value Date   HGBA1C 5.7 (H) 12/24/2023   MPG 116.89 07/20/2022   MPG 114.02 12/19/2020   No results found for: PROLACTIN Lab Results  Component Value Date   CHOL 199 06/24/2024   TRIG 218 (H) 06/24/2024   HDL 68 06/24/2024   CHOLHDL 3.4 07/20/2022   VLDL 23 07/20/2022   LDLCALC 95 06/24/2024   LDLCALC 104 (H) 12/24/2023   Lab Results  Component Value Date   TSH 3.544 07/20/2022   TSH 1.300 05/16/2021    Therapeutic Level Labs: No results found for: LITHIUM No results found for: VALPROATE No results found for: CBMZ  Current Medications: Current Outpatient Medications  Medication Sig Dispense Refill   QUEtiapine  (SEROQUEL ) 50 MG tablet Take 1 tablet (50 mg total) by mouth at bedtime. 22 tablet 0   atorvastatin  (LIPITOR) 20 MG tablet Take 1 tablet (20 mg total) by mouth at bedtime. 90 tablet 1   calcium  carbonate (ANTACID) 500 MG chewable tablet Chew 1 tablet by mouth 4 times daily as needed for indigestion/heartburn. (Patient not taking: Reported on 06/02/2024) 150 tablet 0   clindamycin  (CLEOCIN ) 300 MG capsule Take 1 capsule (300 mg total) by mouth 4 (four) times daily. X 7 days (Patient not taking: Reported on 08/05/2024) 28 capsule 0   cloNIDine  (CATAPRES ) 0.2 MG tablet Take 1 tablet (0.2 mg total) by mouth at bedtime. 30 tablet 0   desvenlafaxine  (PRISTIQ ) 25 MG 24 hr tablet Take 3 tablets (75 mg total) by mouth daily. 90 tablet 0   gabapentin  (NEURONTIN ) 600 MG tablet Take 1 tablet (600 mg total) by mouth in the morning, at noon, in the evening, and at bedtime. 120 tablet 0   hydrOXYzine  (ATARAX ) 50 MG tablet Take 1 tablet (50 mg total) by mouth 3 (three) times daily as needed for anxiety. 90 tablet 0   hyoscyamine  (LEVSIN ) 0.125 MG tablet Take 1 tablet (0.125  mg total) by mouth every 6 (six) hours as needed for cramping. 30 tablet 3   methocarbamol  (ROBAXIN ) 500 MG tablet Take 1 tablet (500 mg total) by mouth every  8 (eight) hours as needed for muscle spasms. 90 tablet 2   mirtazapine  (REMERON  SOL-TAB) 45 MG disintegrating tablet Take 1 tablet (45 mg total) by mouth at bedtime. 30 tablet 0   ondansetron  (ZOFRAN ) 4 MG tablet Take 1 tablet (4 mg total) by mouth every 8 (eight) hours as needed for nausea or vomiting. (Patient not taking: Reported on 08/05/2024) 20 tablet 0   pantoprazole  (PROTONIX ) 40 MG tablet Take 1 tablet (40 mg total) by mouth 2 (two) times daily before a meal. 180 tablet 1   promethazine  (PHENERGAN ) 25 MG tablet Take 1 tablet (25 mg total) by mouth every 8 (eight) hours as needed for nausea or vomiting. 60 tablet 1   propranolol  (INDERAL ) 10 MG tablet Take 1 tablet (10 mg total) by mouth 3 (three) times daily as needed (anxiety, at the onset of symptoms). 90 tablet 0   valsartan  (DIOVAN ) 80 MG tablet Take 1 tablet (80 mg total) by mouth daily. **Please keep appointment for additional refills.** 90 tablet 1   vancomycin  (VANCOCIN ) 125 MG capsule Take 1 capsule (125 mg total) by mouth 4 (four) times daily. (Patient not taking: Reported on 08/05/2024) 112 capsule 0   No current facility-administered medications for this visit.     Musculoskeletal: Strength & Muscle Tone: within normal limits Gait & Station: normal Patient leans: N/A  Psychiatric Specialty Exam: Review of Systems  Psychiatric/Behavioral:  Positive for dysphoric mood and sleep disturbance. Negative for decreased concentration, hallucinations, self-injury and suicidal ideas. The patient is nervous/anxious. The patient is not hyperactive.     Blood pressure 118/77, pulse 77, temperature 97.9 F (36.6 C), temperature source Oral, height 5' 3 (1.6 m), weight 150 lb (68 kg), last menstrual period 10/27/2019, SpO2 99%.Body mass index is 26.57 kg/m.  General Appearance:  Casual  Eye Contact:  Good  Speech:  Clear and Coherent and Normal Rate  Volume:  Normal  Mood:  Anxious, Depressed, and Irritable  Affect:  Congruent and Tearful  Thought Process:  Coherent, Goal Directed, and Descriptions of Associations: Intact  Orientation:  Full (Time, Place, and Person)  Thought Content: WDL   Suicidal Thoughts:  No  Homicidal Thoughts:  No  Memory:  Immediate;   Good Recent;   Good Remote;   Good  Judgement:  Good  Insight:  Good  Psychomotor Activity:  Normal  Concentration:  Concentration: Good and Attention Span: Good  Recall:  Good  Fund of Knowledge: Good  Language: Good  Akathisia:  No  Handed:  Left  AIMS (if indicated): not done  Assets:  Communication Skills Desire for Improvement Financial Resources/Insurance Housing  ADL's:  Intact  Cognition: WNL  Sleep:  Poor   Screenings: AIMS    Flowsheet Row [facility]  AIMS Total Score 0   AUDIT    Flowsheet Row [facility] ([facility] Ad[facility] ([facility]  Alcohol  Use Disorder Identification Test Final Score (AUDIT) 6 11 0   GAD-7    Flowsheet Row Clinical Support from 08/07/2024 in [facility] Counselor from 08/06/2024 in [facility] Office Visit from 08/05/2024 in [facility] A Dept Of [place]. [facility] Office Visit from 06/24/2024 in [facility] - A Dept Of [place].  [facility] Office Visit from 04/14/2024 in [facility]  Total GAD-7 Score 16 16 16 14 14    PHQ2-9    Flowsheet Row Clinical Support from 08/07/2024 in [facility] Counselor from 08/06/2024 in [facility] Office Visit from 08/05/2024 in [facility] - A Dept Of [place]. [facility] Office Visit from 06/24/2024 in [facility] - A Dept Of . [facility] Office Visit from 04/14/2024 in [facility]  PHQ-2 Total Score 2 6 4 3 3   PHQ-9 Total Score 11 17 12 9 12    Flowsheet Row Clinical Support from 08/07/2024 in [facility] Counselor from 08/06/2024 in [facility] Office Visit from 04/14/2024 in [facility]  C-SSRS RISK CATEGORY Moderate Risk Moderate Risk Moderate Risk     Assessment and Plan:   Margaret Mccann is a 52 year old female with a past psychiatric history significant for generalized anxiety disorder, major depressive disorder (recurrent episode, in partial remission, with anxious distress) and PTSD who presents to [facility] for follow-up and medication management.  Patient was last seen by Ismael B. Izella, MD on 06/26/2024.  During her last encounter, patient was being managed on the following psychiatric medications:  Pristiq  75 mg daily Mirtazapine  45 mg at bedtime Gabapentin  600 mg 4 times daily Buspirone  5 mg 3 times daily Hydroxyzine  50 mg 3 times daily as needed  Patient presents to the encounter requesting to not change any of her current prescriptions.  Patient's main concern today revolves around her inability to sleep.  She reports that she has been receiving roughly 2 to 4 hours of sleep at night.  As a result, patient has been experiencing worsening depression and anxiety.  A PHQ-9 screen was performed with the patient scoring an 11.  A GAD-7 screen was also performed with the patient scoring a 16.  Patient reports that she would like to be placed on Klonopin  stating that  the medication was effective in managing her anxiety and sleep.  Provider informed patient that she would not be prescribed Klonopin  at this time.  Patient also requested to be placed back on Seroquel  stating that Seroquel  was the medication that was very effective in providing her with sleep.    Discussed with patient that she is currently on several different hypnotics and that putting her on an additional medication that causes sedation could prove harmful to the patient.  Patient verbalized the risk associated with adding on Seroquel  to her prescriptions.  Provider reached out to Dr. Homer regarding patient's insistence on being placed on Seroquel .  Provider only agreed to place patient on Seroquel  50 mg at bedtime for the management of her mood and sleep if she agreed to discontinue her use of hydroxyzine .  Patient verbalized understanding.  Patient to be placed on Seroquel  50 mg at bedtime for the management of her mood and for sleep.  Patient to follow-up with her provider Dollie B. Izella, MD) to ensure that patient has discontinued her use of hydroxyzine .  Patient's medication to be e-prescribed to pharmacy of choice.  Patient's last EKG was performed on 03/12/2024.  Patient's QTc was 446 ms.  A [place] Suicide Severity Rating Scale was performed with the patient being considered moderate risk.  Patient denies suicidal ideations and is able to contract for safety at this  time.  Safety planning was discussed with the patient prior to the conclusion of the encounter.  - Patient was instructed to contact 911 in the event of a mental health crisis. - Patient was instructed to contact [facility] [facility] [employer] in the event of a mental health crisis. - Patient was instructed to present to [facility] in the event of a mental health crisis.  Collaboration of Care: Collaboration of Care: Medication Management AEB provider managing patient's psychiatric  medications, Primary Care Provider AEB patient being followed by internal medicine, Psychiatrist AEB patient being followed by a mental health provider at this facility, and Other provider involved in patient's care AEB patient being seen by gastroenterology  Patient/Guardian was advised Release of Information must be obtained prior to any record release in order to collaborate their care with an outside provider. Patient/Guardian was advised if they have not already done so to contact the registration department to sign all necessary forms in order for us  to release information regarding their care.   Consent: Patient/Guardian gives verbal consent for treatment and assignment of benefits for services provided during this visit. Patient/Guardian expressed understanding and agreed to proceed.   1. GAD (generalized anxiety disorder) (Primary) Patient to continue taking Pristiq  75 mg daily for the management of her generalized anxiety disorder Patient to continue taking Remeron  45 mg at bedtime for the management of her generalized anxiety disorder Patient to continue taking gabapentin  600 mg 4 times daily for the management of her generalized anxiety disorder Patient to continue taking BuSpar  5 mg 3 times daily for the management of her generalized anxiety disorder  2. Major depressive disorder, recurrent episode, in partial remission with anxious distress Patient to continue taking Pristiq  75 mg daily for the management of her major depressive disorder Patient to continue taking Remeron  45 mg at bedtime for the management of her major depressive disorder  - QUEtiapine  (SEROQUEL ) 50 MG tablet; Take 1 tablet (50 mg total) by mouth at bedtime.  Dispense: 22 tablet; Refill: 0  3. Primary hypertension Patient to continue taking clonidine  0.2 mg daily at bedtime for the management of her primary hypertension  4. PTSD (post-traumatic stress disorder)  Patient to follow-up with Ismael Franco, MD on  08/28/2024 Provider spent a total of 45 minutes with the patient/reviewing patient's chart  Reginia FORBES Bolster, PA 08/07/2024, 8:00 AM

## 2024-08-10 ENCOUNTER — Encounter (HOSPITAL_COMMUNITY): Payer: Self-pay | Admitting: Physician Assistant

## 2024-08-11 ENCOUNTER — Other Ambulatory Visit (HOSPITAL_BASED_OUTPATIENT_CLINIC_OR_DEPARTMENT_OTHER): Payer: Self-pay

## 2024-08-13 ENCOUNTER — Ambulatory Visit (HOSPITAL_COMMUNITY): Admitting: Professional

## 2024-08-13 DIAGNOSIS — F411 Generalized anxiety disorder: Secondary | ICD-10-CM

## 2024-08-13 NOTE — Psych (Signed)
 Virtual Visit via Video Note  I connected with Margaret Mccann on 08/13/24 at 10:00 AM EST by a video enabled telemedicine application and verified that I am speaking with the correct person using two identifiers.  Location: Patient: Home Provider: Clinical Home Office   I discussed the limitations of evaluation and management by telemedicine and the availability of in person appointments. The patient expressed understanding and agreed to proceed.  Follow Up Instructions:    I discussed the assessment and treatment plan with the patient. The patient was provided an opportunity to ask questions and all were answered. The patient agreed with the plan and demonstrated an understanding of the instructions.   The patient was advised to call back or seek an in-person evaluation if the symptoms worsen or if the condition fails to improve as anticipated.  I provided 10 minutes of non-face-to-face time during this encounter.   [place] JINNY Devoid, LCMHC  Cln met with Margaret Mccann to complete a CCA for PHP. Cln re-oriented Margaret Mccann to PHP. Margaret Mccann reports she is unable to start PHP at this time due to needing to move, sell stuff to downsize, find a new place to live, and find a new job. She reports she wants to do the program after she has accomplished these tasks which will be January. She shares she is connected with the [employer] for support and went to the [employer] this week. She denies Si/HI at this time. She reports she will call when she is able to attend. [phone]

## 2024-08-14 ENCOUNTER — Other Ambulatory Visit (HOSPITAL_BASED_OUTPATIENT_CLINIC_OR_DEPARTMENT_OTHER): Payer: Self-pay

## 2024-08-15 ENCOUNTER — Other Ambulatory Visit: Payer: Self-pay | Admitting: Family Medicine

## 2024-08-15 ENCOUNTER — Other Ambulatory Visit (HOSPITAL_BASED_OUTPATIENT_CLINIC_OR_DEPARTMENT_OTHER): Payer: Self-pay

## 2024-08-15 MED ORDER — AMLODIPINE BESYLATE 2.5 MG PO TABS
2.5000 mg | ORAL_TABLET | Freq: Every day | ORAL | 1 refills | Status: DC
  Filled 2024-08-15 (×2): qty 90, 90d supply, fill #0

## 2024-08-18 ENCOUNTER — Ambulatory Visit (INDEPENDENT_AMBULATORY_CARE_PROVIDER_SITE_OTHER): Payer: Self-pay | Admitting: Nurse Practitioner

## 2024-08-18 ENCOUNTER — Other Ambulatory Visit: Payer: Self-pay

## 2024-08-18 ENCOUNTER — Other Ambulatory Visit (HOSPITAL_BASED_OUTPATIENT_CLINIC_OR_DEPARTMENT_OTHER): Payer: Self-pay

## 2024-08-18 ENCOUNTER — Encounter: Payer: Self-pay | Admitting: Nurse Practitioner

## 2024-08-18 ENCOUNTER — Other Ambulatory Visit

## 2024-08-18 VITALS — BP 110/60 | HR 94 | Ht 63.0 in | Wt 155.0 lb

## 2024-08-18 DIAGNOSIS — R112 Nausea with vomiting, unspecified: Secondary | ICD-10-CM

## 2024-08-18 DIAGNOSIS — R7989 Other specified abnormal findings of blood chemistry: Secondary | ICD-10-CM

## 2024-08-18 DIAGNOSIS — A0472 Enterocolitis due to Clostridium difficile, not specified as recurrent: Secondary | ICD-10-CM | POA: Diagnosis not present

## 2024-08-18 LAB — BASIC METABOLIC PANEL WITH GFR
BUN: 16 mg/dL (ref 6–23)
CO2: 26 meq/L (ref 19–32)
Calcium: 9.5 mg/dL (ref 8.4–10.5)
Chloride: 104 meq/L (ref 96–112)
Creatinine, Ser: 0.8 mg/dL (ref 0.40–1.20)
GFR: 85.33 mL/min (ref >60.00)
Glucose, Bld: 99 mg/dL (ref 70–99)
Potassium: 4.6 meq/L (ref 3.5–5.1)
Sodium: 136 meq/L (ref 135–145)

## 2024-08-18 LAB — HEPATIC FUNCTION PANEL
ALT: 62 U/L — ABNORMAL HIGH (ref 0–35)
AST: 28 U/L (ref 0–37)
Albumin: 4.4 g/dL (ref 3.5–5.2)
Alkaline Phosphatase: 80 U/L (ref 39–117)
Bilirubin, Direct: 0 mg/dL (ref 0.0–0.3)
Total Bilirubin: 0.3 mg/dL (ref 0.2–1.2)
Total Protein: 7.5 g/dL (ref 6.0–8.3)

## 2024-08-18 LAB — CBC
HCT: 34 % — ABNORMAL LOW (ref 36.0–46.0)
Hemoglobin: 11.6 g/dL — ABNORMAL LOW (ref 12.0–15.0)
MCHC: 34.2 g/dL (ref 30.0–36.0)
MCV: 87.9 fl (ref 78.0–100.0)
Platelets: 368 K/uL (ref 150.0–400.0)
RBC: 3.87 Mil/uL (ref 3.87–5.11)
RDW: 16.4 % — ABNORMAL HIGH (ref 11.5–15.5)
WBC: 6.1 K/uL (ref 4.0–10.5)

## 2024-08-18 MED ORDER — NA SULFATE-K SULFATE-MG SULF 17.5-3.13-1.6 GM/177ML PO SOLN
1.0000 | Freq: Once | ORAL | 0 refills | Status: AC
  Filled 2024-08-18: qty 354, 1d supply, fill #0

## 2024-08-18 NOTE — Progress Notes (Unsigned)
 [facility] MD/PA/NP OP Progress Note   Margaret Mccann  MRN:  [id number]   Assessment and Plan: Patient presents in person for a follow-up appointment. In the prior visit, plan was tapering off the propranolol . In the interim, patient requested to be placed back on propranolol  and discontinue BuSpar  due partial effectiveness. She also called another time and requested to be placed on Seroquel  due to poor sleep. It was discussed that since she is on multiple anxiolytic medications, adding medications is appropriate strictly during scheduled appointments to which she came during walk in hours and saw Lytle Bolster PA for a sooner f/u appt. Discussion with the attending psychiatrist and resident psychiatrist covering my inbox completed and it was agreed that prior to this appointment, patient would be prescribed enough Seroquel  to bridge her to this appointment and in order to continue to be on Seroquel , she will need to discontinue hydroxyzine  in replacement of Seroquel . Patient was also referred to [facility] but unfortunately declined and her counselor's note, stating she is unable to start PHP at this time due to needing to move, sell stuff to downsize, find a new place to live, and find a new job. She reports she wants to do the program after she has accomplished these tasks which will be January. She shares she is connected with the [facility] for support and went to the [employer] this week.   Today, patient appears to be doing better than prior assessment regarding anxiety and sleep, attributing this to starting Seroquel . She is interested in starting PHP once she moves out of her home. She did not bring her hydroxyzine  bottles in this appointment and I discussed that if she does not bring the bottle in next visit, that we may discontinue Seroquel  and she agrees. Ordered antipsychotic monitoring labs today. F/u in 6 weeks.  # GAD (generalized anxiety disorder) - Started therapy, planning to  start PHP in January - Continue Pristiq  75 mg daily - Continue Remeron  45 mg at bedtime - Continue gabapentin  600 mg four times daily - Continue Seroquel  50 mg at bedtime   - Antipsychotic monitoring labs ordered - Continue propranolol  10 MG tablets TID PRN for anxiety  # Major depressive disorder, recurrent episode, in partial remission with anxious distress (HCC) - Remeron  as above - Pristiq  as above   # Primary hypertension -Continue clonidine  0.2 MG at bedtime (states this is also for PTSD) -Propranolol  as above -Advised PCP follow-up  Chief Complaint: MM follow-up   Interval history:  Patient seen alone.  Patient reports feeling good today. Since the previous visit, she notes feeling increasing well since seroquel  was added. She states now falling asleep 30 minutes after taking the Seroquel . She reports 7-8 hours of sleep. She states getting ready to move out of her house and planning to moving in with her parents to help caring for her stepdad. She states she will be out of the home mid January. She notes going to a career fair this morning and planning on getting a job. She notes when she found out her father had bladder cancer in July and she was having poor sleep until she got on Seroquel . She also had c dif recently.   Patient reports increased appetite, attributing this to her Seroquel .   Patient denies current SI, HI, and AVH.   Substance use:  Currently smoking 5 cigarettes daily Alcohol : most recently 04/2023 Illicit: Denies current cocaine use (last time was 3-4 years ago). Denies other current illicit substance use.  Past Psychiatric History:  MDD , anxiety, PTSD, Substance abuse (cocaine, alcohol )  Family Psychiatric History: Sister - committed suicide in 2005 by overdosing.  Social History: Living: living alone Occupation: denies Children: yes, one daughter and one son Support: sponsor and group   Past Medical History:  Past Medical History:   Diagnosis Date   Alcohol  withdrawal seizure with complication, with unspecified complication (HCC) 12/23/2020   Anxiety    Bipolar 1 disorder (HCC)    C. difficile colitis    Depression    GERD (gastroesophageal reflux disease)    HLD (hyperlipidemia)    HTN (hypertension)    Intentional drug overdose (HCC) 08/04/2020   Major depressive disorder, recurrent episode with anxious distress 08/11/2020   Morgellons syndrome    MRSA (methicillin resistant Staphylococcus aureus)    Suicide attempt (HCC)    Suicide by drug overdose (HCC) 08/11/2020   Tricuspid valve regurgitation 12/18/2020    Past Surgical History:  Procedure Laterality Date   APPENDECTOMY      Family History:  Family History  Problem Relation Age of Onset   Heart Problems Mother        abnormal beats   Skin cancer Mother    Heart attack Father 64   Stroke Father    Diabetes Father    Severe combined immunodeficiency Sister    Hypertension Paternal Grandfather    COPD Paternal Grandfather    Breast cancer Paternal Aunt    Ulcerative colitis Neg Hx    Esophageal cancer Neg Hx     Social History:  Social History   Socioeconomic History   Marital status: Single    Spouse name: Not on file   Number of children: 2   Years of education: Not on file   Highest education level: Bachelor's degree (e.g., BA, AB, BS)  Occupational History   Not on file  Tobacco Use   Smoking status: Every Day    Current packs/day: 0.25    Average packs/day: 0.3 packs/day for 5.0 years (1.3 ttl pk-yrs)    Types: Cigarettes   Smokeless tobacco: Never  Vaping Use   Vaping status: Never Used  Substance and Sexual Activity   Alcohol  use: Not Currently    Comment: occas   Drug use: Not Currently    Types: Cocaine    Comment: Hx of drug abuse   Sexual activity: Not Currently    Birth control/protection: Pill  Other Topics Concern   Not on file  Social History Narrative   ** Merged History Encounter **       Social  Drivers of Health   Financial Resource Strain: High Risk (08/06/2024)   Overall Financial Resource Strain (CARDIA)    Difficulty of Paying Living Expenses: Very hard  Food Insecurity: Low Risk  (05/25/2024)   Received from [facility]   Hunger Vital Sign    Within the past 12 months, you worried that your food would run out before you got money to buy more: Never true    Within the past 12 months, the food you bought just didn't last and you didn't have money to get more. : Never true  Recent Concern: Food Insecurity - Food Insecurity Present (03/20/2024)   Hunger Vital Sign    Worried About Running Out of Food in the Last Year: Sometimes true    Ran Out of Food in the Last Year: Sometimes true  Transportation Needs: No Transportation Needs (05/25/2024)   Received from [facility]  In the past 12 months, has lack of reliable transportation kept you from medical appointments, meetings, work or from getting things needed for daily living? : No  Physical Activity: Insufficiently Active (03/20/2024)   Exercise Vital Sign    Days of Exercise per Week: 1 day    Minutes of Exercise per Session: 20 min  Stress: Stress Concern Present (08/06/2024)   [employer] of Occupational Health - Occupational Stress Questionnaire    Feeling of Stress: Very much  Social Connections: Socially Isolated (03/20/2024)   Social Connection and Isolation Panel    Frequency of Communication with Friends and Family: More than three times a week    Frequency of Social Gatherings with Friends and Family: More than three times a week    Attends Religious Services: Never    Database Administrator or Organizations: No    Attends Engineer, Structural: Not on file    Marital Status: Divorced    Allergies:  Allergies  Allergen Reactions   Lamictal [Lamotrigine] Anaphylaxis, Rash and Other (See Comments)    Stevens-Johnson syndrome and fevers, also   Lamictal [Lamotrigine]  Anaphylaxis, Rash and Other (See Comments)    Fevers and Stevens-Johnson syndrome, also   Tramadol  Other (See Comments)    Pt had a seizure after taking Tramadol !!   Erythromycin Nausea And Vomiting   Lamisil  [Terbinafine ] Nausea And Vomiting   Geodon  [Ziprasidone  Hcl] Rash   Levetiracetam Anxiety    Metabolic Disorder Labs: Lab Results  Component Value Date   HGBA1C 5.7 (H) 12/24/2023   MPG 116.89 07/20/2022   MPG 114.02 12/19/2020   No results found for: PROLACTIN Lab Results  Component Value Date   CHOL 199 06/24/2024   TRIG 218 (H) 06/24/2024   HDL 68 06/24/2024   CHOLHDL 3.4 07/20/2022   VLDL 23 07/20/2022   LDLCALC 95 06/24/2024   LDLCALC 104 (H) 12/24/2023   Lab Results  Component Value Date   TSH 3.544 07/20/2022   TSH 1.300 05/16/2021    Therapeutic Level Labs: No results found for: LITHIUM No results found for: VALPROATE No results found for: CBMZ  Current Medications: Current Outpatient Medications  Medication Sig Dispense Refill   amLODipine  (NORVASC ) 5 MG tablet Take 1 tablet (5 mg total) by mouth daily. 90 tablet 1   atorvastatin  (LIPITOR) 20 MG tablet Take 1 tablet (20 mg total) by mouth at bedtime. 90 tablet 1   cloNIDine  (CATAPRES ) 0.2 MG tablet Take 1 tablet (0.2 mg total) by mouth at bedtime. 30 tablet 0   desvenlafaxine  (PRISTIQ ) 25 MG 24 hr tablet Take 3 tablets (75 mg total) by mouth daily. 90 tablet 0   gabapentin  (NEURONTIN ) 600 MG tablet Take 1 tablet (600 mg total) by mouth in the morning, at noon, in the evening, and at bedtime. 120 tablet 0   methocarbamol  (ROBAXIN ) 500 MG tablet Take 1 tablet (500 mg total) by mouth every 8 (eight) hours as needed for muscle spasms. 90 tablet 2   mirtazapine  (REMERON  SOL-TAB) 45 MG disintegrating tablet Take 1 tablet (45 mg total) by mouth at bedtime. 30 tablet 0   pantoprazole  (PROTONIX ) 40 MG tablet Take 1 tablet (40 mg total) by mouth 2 (two) times daily before a meal. 180 tablet 1   predniSONE   (DELTASONE ) 20 MG tablet Take 1 tablet (20 mg total) by mouth daily with breakfast. 5 tablet 0   promethazine  (PHENERGAN ) 25 MG tablet Take 1 tablet (25 mg total) by mouth every 8 (eight) hours  as needed for nausea or vomiting. 60 tablet 1   propranolol  (INDERAL ) 10 MG tablet Take 1 tablet (10 mg total) by mouth 3 (three) times daily as needed (anxiety, at the onset of symptoms). 90 tablet 0   QUEtiapine  (SEROQUEL ) 50 MG tablet Take 1 tablet (50 mg total) by mouth at bedtime. 22 tablet 0   valsartan  (DIOVAN ) 160 MG tablet Take 1 tablet (160 mg total) by mouth daily. 90 tablet 1   No current facility-administered medications for this visit.    Objective  Psychiatric Specialty Exam: General Appearance: appears at stated age, casually dressed and groomed   Behavior: pleasant and cooperative   Psychomotor Activity: no psychomotor agitation or retardation noted   Eye Contact: fair  Speech: normal amount, volume and fluency    Mood: euthymic  Affect: congruent, pleasant and interactive   Thought Process: linear, goal directed, no circumstantial or tangential thought process noted, no racing thoughts or flight of ideas  Descriptions of Associations: intact   Thought Content Hallucinations: denies AH, VH , does not appear responding to stimuli  Delusions: no paranoia, delusions of control, grandeur, ideas of reference, thought broadcasting, and magical thinking  Suicidal Thoughts: denies SI, intention, plan  Homicidal Thoughts: denies HI, intention, plan   Alertness/Orientation: alert and fully oriented   Insight: fair Judgment: fair  Memory: intact   Executive Functions  Concentration: intact  Attention Span: fair  Recall: intact  Fund of Knowledge: fair   Physical Exam  General: Pleasant, well-appearing. No acute distress. Pulmonary: Normal effort. No wheezing or rales. Skin: No obvious rash or lesions. Neuro: A&Ox3.No focal deficit.  Review of Systems  No reported  symptoms  Screenings: AIMS    Flowsheet Row [facility]) [facility]  AIMS Total Score 0   AUDIT    Flowsheet Row [facility] Ad[facility] ([facility] Ad[facility] (Dis[facility]) [facility]  Alcohol  Use Disorder Identification Test Final Score (AUDIT) 6 11 0   GAD-7    Flowsheet Row Clinical Support from 08/07/2024 in [facility] Counselor from 08/06/2024 in [facility] Office Visit from 08/05/2024 in [facility] - A Dept Of [place]. [facility] Office Visit from 06/24/2024 in [facility] [place] - A Dept Of [place]. [facility] Office Visit from 04/14/2024 in [facility]  Total GAD-7 Score 16 16 16 14 14    PHQ2-9    Flowsheet Row Clinical Support from 08/07/2024 in [facility] Counselor from 08/06/2024 in [facility] Office Visit from 08/05/2024 in [facility] [place] - A Dept Of [place]. [facility] Office Visit from 06/24/2024 in [facility] [place] - A Dept Of [place]. [facility] Office Visit from 04/14/2024 in [facility]  PHQ-2 Total Score 2 6 4 3 3   PHQ-9 Total Score 11 17 12 9 12    Flowsheet Row Clinical Support from 08/07/2024 in [facility] Counselor from 08/06/2024 in [facility] Office Visit from 04/14/2024 in [facility]  C-SSRS RISK CATEGORY Moderate Risk Moderate Risk Moderate Risk    Ismael Franco, MD PGY-3 Psychiatry Resident

## 2024-08-18 NOTE — Patient Instructions (Signed)
 Your provider has requested that you go to the basement level for lab work before leaving today. Press B on the elevator. The lab is located at the first door on the left as you exit the elevator.  Take 1 tablespoon of Gaviscon three times a day for reflux symptoms.  You have been scheduled for an endoscopy and colonoscopy. Please follow the written instructions given to you at your visit today.  If you use inhalers (even only as needed), please bring them with you on the day of your procedure.  DO NOT TAKE 7 DAYS PRIOR TO TEST- Trulicity (dulaglutide) Ozempic , Wegovy  (semaglutide ) Mounjaro, Zepbound (tirzepatide) Bydureon Bcise (exanatide extended release)  DO NOT TAKE 1 DAY PRIOR TO YOUR TEST Rybelsus  (semaglutide ) Adlyxin (lixisenatide) Victoza (liraglutide) Byetta (exanatide) ___________________________________________________________________________

## 2024-08-18 NOTE — Progress Notes (Signed)
 08/18/2024 Margaret Mccann [id number] Jun 28, 1971   Chief Complaint: Follow up C. diff  History of Present Illness: Margaret Mccann is a 53 year old female with a past medical history of hypertension, hyperlipidemia, alcohol  use disorder, polysubstance abuse, C. Diff and chronic diarrhea.  I last saw patient in office 06/02/2024 and at that time she continued to have nausea, vomiting, diarrhea and abdominal pain. She endorsed taking about 1 week of the Vancomycin  course previously prescribed for C. difficile then did not take any during her hospital admission or since she was discharged. She described passing 7-8 nonbloody loose stools daily.  She was instructed to complete the prior course of Vancomycin  and to contact our office if her diarrhea persisted.   She contacted our office on 07/04/2024 with persistent diarrhea. Per Dr. Abran, she was prescribed Vancomycin  125 mg p.o. 4 times daily x 1 month (30 days).   She contacted our office 07/16/2024 because she was concerned she had [name] syndrome after taking Vanco.  She described having sores and blisters to the corners and inside of her mouth.  Dr. Abran informed the patient that oral Vancomycin  is not systemically absorbed and it would be extremely rare for her to have developed Stevens-Johnson syndrome from this medication.  She was instructed to follow with her PCP regarding of flushing and skin issues.  I Discussed the use of AI scribe software for clinical note transcription with the patient, who gave verbal consent to proceed.  History of Present Illness Margaret Mccann is a 53 year old female who presents for follow-up of C. difficile infection.  She did not complete the second course of vancomycin  due to severe dizziness, lightheadedness, balance issues, and frequent vomiting. These symptoms improved after discontinuing the medication. She took the medication for approximately seven to nine days before  stopping.  Currently, her stools are not consistently normal but have improved significantly, correlating with a healthy diet. Avoiding dairy reduces diarrhea, and her stools are formed when she consumes vegetables. She had two formed stools today after eating sweet potato and salad last night.  She continues to experience nausea two to three days a week, although vomiting has decreased. The last episode of vomiting occurred last Thursday, with undigested food and medication being expelled. She is currently taking Phenergan  as needed for nausea and recently refilled her prescription.  She reports daily heartburn, for which she takes Protonix  twice daily.  No dysphagia.  EGD 05/2017 showed severe reflux esophagitis Barrett's esophagus.  She has ceased using cocaine since her hospitalization and does not consume alcohol .  Her anxiety is well-controlled at this time.      Latest Ref Rng & Units 06/02/2024    3:41 PM 05/05/2024    2:38 PM 03/10/2024    8:47 PM  CBC  WBC 4.0 - 10.5 K/uL 6.0  7.0  10.1   Hemoglobin 12.0 - 15.0 g/dL 88.0  88.3  88.2   Hematocrit 36.0 - 46.0 % 36.0  34.9  34.4   Platelets 150.0 - 400.0 K/uL 396.0  355.0  323        Latest Ref Rng & Units 06/10/2024    1:04 PM 06/02/2024    3:41 PM 05/05/2024    2:38 PM  CMP  Glucose 70 - 99 mg/dL  92  89   BUN 6 - 23 mg/dL  14  16   Creatinine 9.59 - 1.20 mg/dL  9.24  9.31   Sodium 864 - 145 mEq/L  134  134   Potassium 3.5 - 5.1 mEq/L  4.4  5.0   Chloride 96 - 112 mEq/L  101  101   CO2 19 - 32 mEq/L  26  25   Calcium  8.4 - 10.5 mg/dL  9.8  9.8   Total Protein 6.0 - 8.3 g/dL 7.0  7.7  7.2   Total Bilirubin 0.2 - 1.2 mg/dL 0.2  0.3  0.4   Alkaline Phos 39 - 117 U/L 86  95  93   AST 0 - 37 U/L 41  60  35   ALT 0 - 35 U/L 102  110  68     CTAP with contrast 05/25/2024:  SABRA Lower Chest: Small sliding hiatal hernia. Dependent atelectasis.   . Liver: No suspicious focal findings. Diffuse hepatic steatosis.  .  Gallbladder/Biliary: Unremarkable.  SABRA Spleen: No acute findings. Small accessory splenule.  . Pancreas: Homogeneous parenchymal enhancement. Edematous appearance of the pancreas with mild peripancreatic stranding and thickening of the left pararenal fascia. No peripancreatic fluid collection. No focal lesion identified. No pancreatic duct dilation.  . Adrenals: Unremarkable.  . Kidneys: Unremarkable.   . Peritoneum/Mesenteries/Extraperitoneum: No free air. No free fluid or loculated drainable collection. No pathologically enlarged lymph nodes.  . Gastrointestinal tract: No evidence of obstruction. Appendectomy.   . Ureters: Unremarkable.  . Bladder: Unremarkable.  . Reproductive System: Unremarkable.   . Vascular: Unremarkable.  . Musculoskeletal: No acute displaced fractures. Levocurvature of the lumbar spine centered at L3-L4. Polyarticular degenerative changes. No aggressive focal bony lesions. Abdominal wall soft tissues unremarkable.    GI PROCEDURES:  EGD 06/08/2017: - Moderately severe reflux esophagitis. Biopsied. Ringlike stricturing. - Normal esophagus, otherwise.  - Normal stomach.  - Normal examined duodenum. Surgical [P], distal esophagus - esophagitis - SQUAMOUS MUCOSA WITH FOCAL ACUTE INFLAMMATION AND GRANULATION TISSUE CONSISTENT WITH EROSION. - PAS STAIN NEGATIVE FOR FUNGUS. - NO INTESTINAL METAPLASIA, DYSPLASIA OR MALIGNANCY.  Current Outpatient Medications on File Prior to Visit  Medication Sig Dispense Refill   amLODipine  (NORVASC ) 2.5 MG tablet Take 1 tablet (2.5 mg total) by mouth daily. 90 tablet 1   atorvastatin  (LIPITOR) 20 MG tablet Take 1 tablet (20 mg total) by mouth at bedtime. 90 tablet 1   cloNIDine  (CATAPRES ) 0.2 MG tablet Take 1 tablet (0.2 mg total) by mouth at bedtime. 30 tablet 0   desvenlafaxine  (PRISTIQ ) 25 MG 24 hr tablet Take 3 tablets (75 mg total) by mouth daily. 90 tablet 0   gabapentin  (NEURONTIN ) 600 MG tablet Take 1 tablet (600 mg total)  by mouth in the morning, at noon, in the evening, and at bedtime. 120 tablet 0   mirtazapine  (REMERON  SOL-TAB) 45 MG disintegrating tablet Take 1 tablet (45 mg total) by mouth at bedtime. 30 tablet 0   pantoprazole  (PROTONIX ) 40 MG tablet Take 1 tablet (40 mg total) by mouth 2 (two) times daily before a meal. 180 tablet 1   promethazine  (PHENERGAN ) 25 MG tablet Take 1 tablet (25 mg total) by mouth every 8 (eight) hours as needed for nausea or vomiting. 60 tablet 1   propranolol  (INDERAL ) 10 MG tablet Take 1 tablet (10 mg total) by mouth 3 (three) times daily as needed (anxiety, at the onset of symptoms). 90 tablet 0   QUEtiapine  (SEROQUEL ) 50 MG tablet Take 1 tablet (50 mg total) by mouth at bedtime. 22 tablet 0   valsartan  (DIOVAN ) 80 MG tablet Take 1 tablet (80 mg total) by mouth daily. **Please keep appointment for  additional refills.** 90 tablet 1   No current facility-administered medications on file prior to visit.   Allergies  Allergen Reactions   Lamictal [Lamotrigine] Anaphylaxis, Rash and Other (See Comments)    Stevens-Johnson syndrome and fevers, also   Lamictal [Lamotrigine] Anaphylaxis, Rash and Other (See Comments)    Fevers and Stevens-Johnson syndrome, also   Tramadol  Other (See Comments)    Pt had a seizure after taking Tramadol !!   Erythromycin Nausea And Vomiting   Lamisil  [Terbinafine ] Nausea And Vomiting   Geodon  [Ziprasidone  Hcl] Rash   Levetiracetam Anxiety   Current Medications, Allergies, Past Medical History, Past Surgical History, Family History and Social History were reviewed in [employer] record.  Review of Systems:   Constitutional: Negative for fever, sweats, chills or weight loss.  Respiratory: Negative for shortness of breath.   Cardiovascular: Negative for chest pain, palpitations and leg swelling.  Gastrointestinal: See HPI.  Musculoskeletal: Negative for back pain or muscle aches.  Neurological: Negative for dizziness,  headaches or paresthesias.   Physical Exam: BP 110/60   Pulse 94   Ht 5' 3 (1.6 m)   Wt 155 lb (70.3 kg)   LMP 10/27/2019   BMI 27.46 kg/m  Wt Readings from Last 3 Encounters:  08/18/24 155 lb (70.3 kg)  08/07/24 150 lb (68 kg)  08/05/24 147 lb 9.6 oz (67 kg)   LMP 10/27/2019  General: 53 year old female in no acute distress. Head: Normocephalic and atraumatic. Eyes: No scleral icterus. Conjunctiva pink . Ears: Normal auditory acuity. Mouth: Dentition intact. No ulcers or lesions.  Lungs: Clear throughout to auscultation. Heart: Regular rate and rhythm, no murmur. Abdomen: Soft, nontender and nondistended. No masses or hepatomegaly. Normal bowel sounds x 4 quadrants.  Rectal: Deferred. Musculoskeletal: Symmetrical with no gross deformities. Extremities: No edema. Neurological: Alert oriented x 4. No focal deficits.  Psychological: Alert and cooperative.  Calm without agitation.  Normal mood and affect  Assessment and Recommendations:  C. Diff, treated with Vancomycin  x 2 weeks August - Sept 2025. Patient had recurrent diarrhea, prescribed a 2nd course of Vancomycin  125mg  po qid x 30 days per Dr. Abran 07/04/2024, she completed 7 to 9 days of this course then she stopped taking it as she was concerned she was experiencing an adverse response to Vanco with dizziness, flushing and sore in her mouth. Since then, she is passing more solid stools than loose.  She passed 2 solid stools earlier today.  No bloody bowel movements. - Patient to contact our office if diarrhea recurs  Colon cancer screening  - Colonoscopy benefits and risks discussed including risk with sedation, risk of bleeding, perforation and infection   Chronic diarrhea - See plan above  GERD, patient endorses having daily heartburn despite taking pantoprazole  40 mg twice daily. - GERD diet - Continue Pantoprazole  40 mg p.o. twice daily - Start Gaviscon 1 tablespoon p.o. 3 times daily as needed - EGD benefits and  risks discussed including risk with sedation, risk of bleeding, perforation and infection   Nausea/vomiting, improving - Phenergan  12.5 mg 1 tab p.o. every 8 hours as needed for nausea and vomiting  Elevated LFTs.  Hepatic steatosis per CTAP 04/2024. - Hepatic panel - Patient counseled no alcohol   Polysubstance abuse. No alcohol  use for the past 3 months and no cocaine or other drug use since she was discharged home from the hospital 9/5.  - Patient counseled to remain on 100%  alcohol  and cocaine/drug-free

## 2024-08-18 NOTE — Progress Notes (Signed)
 Noted

## 2024-08-19 ENCOUNTER — Ambulatory Visit: Payer: Self-pay | Admitting: Nurse Practitioner

## 2024-08-19 ENCOUNTER — Other Ambulatory Visit (INDEPENDENT_AMBULATORY_CARE_PROVIDER_SITE_OTHER)

## 2024-08-19 DIAGNOSIS — A0472 Enterocolitis due to Clostridium difficile, not specified as recurrent: Secondary | ICD-10-CM

## 2024-08-19 DIAGNOSIS — R197 Diarrhea, unspecified: Secondary | ICD-10-CM

## 2024-08-20 ENCOUNTER — Other Ambulatory Visit

## 2024-08-22 LAB — C. DIFFICILE GDH AND TOXIN A/B
GDH ANTIGEN: NOT DETECTED
MICRO NUMBER:: 17289592
SPECIMEN QUALITY:: ADEQUATE
TOXIN A AND B: NOT DETECTED

## 2024-08-22 LAB — HOUSE ACCOUNT TRACKING

## 2024-08-25 ENCOUNTER — Ambulatory Visit: Payer: Self-pay | Admitting: Internal Medicine

## 2024-08-25 ENCOUNTER — Other Ambulatory Visit (HOSPITAL_BASED_OUTPATIENT_CLINIC_OR_DEPARTMENT_OTHER): Payer: Self-pay

## 2024-08-25 ENCOUNTER — Other Ambulatory Visit: Payer: Self-pay | Admitting: Family Medicine

## 2024-08-25 ENCOUNTER — Other Ambulatory Visit: Payer: Self-pay

## 2024-08-25 DIAGNOSIS — R1012 Left upper quadrant pain: Secondary | ICD-10-CM

## 2024-08-25 MED ORDER — METHOCARBAMOL 500 MG PO TABS
500.0000 mg | ORAL_TABLET | Freq: Three times a day (TID) | ORAL | 2 refills | Status: DC | PRN
  Filled 2024-08-25 (×2): qty 90, 30d supply, fill #0
  Filled 2024-09-15 (×2): qty 90, 30d supply, fill #1

## 2024-08-25 MED ORDER — AMLODIPINE BESYLATE 5 MG PO TABS
5.0000 mg | ORAL_TABLET | Freq: Every day | ORAL | 1 refills | Status: DC
  Filled 2024-08-25: qty 90, 90d supply, fill #0

## 2024-08-26 ENCOUNTER — Other Ambulatory Visit (HOSPITAL_BASED_OUTPATIENT_CLINIC_OR_DEPARTMENT_OTHER): Payer: Self-pay

## 2024-08-27 ENCOUNTER — Other Ambulatory Visit (HOSPITAL_BASED_OUTPATIENT_CLINIC_OR_DEPARTMENT_OTHER): Payer: Self-pay

## 2024-08-27 MED ORDER — PREDNISONE 20 MG PO TABS
20.0000 mg | ORAL_TABLET | Freq: Every day | ORAL | 0 refills | Status: AC
  Filled 2024-08-27: qty 5, 5d supply, fill #0

## 2024-08-27 MED ORDER — VALSARTAN 160 MG PO TABS
160.0000 mg | ORAL_TABLET | Freq: Every day | ORAL | 1 refills | Status: AC
  Filled 2024-08-27: qty 90, 90d supply, fill #0

## 2024-08-27 NOTE — Telephone Encounter (Signed)
 Please see the MyChart message reply(ies) for my assessment and plan.    This patient gave consent for this Medical Advice Message and is aware that it may result in a bill to [employer], as well as the possibility of receiving a bill for a co-payment or deductible. They are an established patient, but are not seeking medical advice exclusively about a problem treated during an in person or video visit in the last seven days. I did not recommend an in person or video visit within seven days of my reply.    I spent a total of 12 minutes cumulative time within 7 days through [employer].  Revis Whalin, MD

## 2024-08-28 ENCOUNTER — Other Ambulatory Visit (HOSPITAL_BASED_OUTPATIENT_CLINIC_OR_DEPARTMENT_OTHER): Payer: Self-pay

## 2024-08-28 ENCOUNTER — Other Ambulatory Visit: Payer: Self-pay

## 2024-08-28 ENCOUNTER — Ambulatory Visit (INDEPENDENT_AMBULATORY_CARE_PROVIDER_SITE_OTHER): Admitting: Psychiatry

## 2024-08-28 VITALS — BP 137/92 | Wt 160.0 lb

## 2024-08-28 DIAGNOSIS — Z5181 Encounter for therapeutic drug level monitoring: Secondary | ICD-10-CM | POA: Diagnosis not present

## 2024-08-28 DIAGNOSIS — F3341 Major depressive disorder, recurrent, in partial remission: Secondary | ICD-10-CM

## 2024-08-28 DIAGNOSIS — F411 Generalized anxiety disorder: Secondary | ICD-10-CM

## 2024-08-28 DIAGNOSIS — I10 Essential (primary) hypertension: Secondary | ICD-10-CM | POA: Diagnosis not present

## 2024-08-28 MED ORDER — DESVENLAFAXINE SUCCINATE ER 25 MG PO TB24
75.0000 mg | ORAL_TABLET | Freq: Every day | ORAL | 1 refills | Status: DC
  Filled 2024-08-28 – 2024-09-19 (×2): qty 90, 30d supply, fill #0

## 2024-08-28 MED ORDER — MIRTAZAPINE 45 MG PO TBDP
45.0000 mg | ORAL_TABLET | Freq: Every day | ORAL | 1 refills | Status: DC
  Filled 2024-08-28 – 2024-09-05 (×3): qty 30, 30d supply, fill #0
  Filled 2024-10-02 (×2): qty 30, 30d supply, fill #1

## 2024-08-28 MED ORDER — CLONIDINE HCL 0.2 MG PO TABS
0.2000 mg | ORAL_TABLET | Freq: Every day | ORAL | 1 refills | Status: DC
  Filled 2024-08-28: qty 30, 30d supply, fill #0
  Filled 2024-09-19: qty 30, 30d supply, fill #1

## 2024-08-28 MED ORDER — PROPRANOLOL HCL 10 MG PO TABS
10.0000 mg | ORAL_TABLET | Freq: Three times a day (TID) | ORAL | 1 refills | Status: DC | PRN
  Filled 2024-08-28 – 2024-09-05 (×2): qty 90, 30d supply, fill #0
  Filled 2024-09-19 – 2024-10-02 (×2): qty 90, 30d supply, fill #1

## 2024-08-28 MED ORDER — QUETIAPINE FUMARATE 50 MG PO TABS
50.0000 mg | ORAL_TABLET | Freq: Every day | ORAL | 1 refills | Status: DC
  Filled 2024-08-28: qty 30, 30d supply, fill #0
  Filled 2024-09-16: qty 30, 30d supply, fill #1

## 2024-08-28 MED ORDER — GABAPENTIN 600 MG PO TABS
600.0000 mg | ORAL_TABLET | Freq: Four times a day (QID) | ORAL | 1 refills | Status: DC
  Filled 2024-08-28 – 2024-09-12 (×3): qty 120, 30d supply, fill #0
  Filled 2024-10-10: qty 120, 30d supply, fill #1

## 2024-08-28 NOTE — Patient Instructions (Signed)
-   bring hydroxyzine  pill bottle

## 2024-08-29 ENCOUNTER — Encounter (HOSPITAL_COMMUNITY): Admitting: Psychiatry

## 2024-09-03 ENCOUNTER — Other Ambulatory Visit (HOSPITAL_COMMUNITY)

## 2024-09-05 ENCOUNTER — Other Ambulatory Visit (HOSPITAL_BASED_OUTPATIENT_CLINIC_OR_DEPARTMENT_OTHER): Payer: Self-pay

## 2024-09-09 ENCOUNTER — Ambulatory Visit

## 2024-09-09 ENCOUNTER — Other Ambulatory Visit (HOSPITAL_BASED_OUTPATIENT_CLINIC_OR_DEPARTMENT_OTHER): Payer: Self-pay

## 2024-09-12 ENCOUNTER — Other Ambulatory Visit: Payer: Self-pay

## 2024-09-12 ENCOUNTER — Other Ambulatory Visit (HOSPITAL_BASED_OUTPATIENT_CLINIC_OR_DEPARTMENT_OTHER): Payer: Self-pay

## 2024-09-12 ENCOUNTER — Ambulatory Visit

## 2024-09-12 ENCOUNTER — Other Ambulatory Visit: Payer: Self-pay | Admitting: Family Medicine

## 2024-09-12 DIAGNOSIS — Z1231 Encounter for screening mammogram for malignant neoplasm of breast: Secondary | ICD-10-CM

## 2024-09-12 DIAGNOSIS — R110 Nausea: Secondary | ICD-10-CM

## 2024-09-15 ENCOUNTER — Other Ambulatory Visit (HOSPITAL_BASED_OUTPATIENT_CLINIC_OR_DEPARTMENT_OTHER): Payer: Self-pay

## 2024-09-15 ENCOUNTER — Other Ambulatory Visit: Payer: Self-pay | Admitting: Family Medicine

## 2024-09-15 MED ORDER — PROMETHAZINE HCL 12.5 MG PO TABS
12.5000 mg | ORAL_TABLET | Freq: Three times a day (TID) | ORAL | 1 refills | Status: DC | PRN
  Filled 2024-09-15: qty 60, 20d supply, fill #0
  Filled 2024-10-10: qty 60, 20d supply, fill #1

## 2024-09-16 ENCOUNTER — Other Ambulatory Visit (HOSPITAL_BASED_OUTPATIENT_CLINIC_OR_DEPARTMENT_OTHER): Payer: Self-pay

## 2024-09-19 ENCOUNTER — Other Ambulatory Visit (HOSPITAL_BASED_OUTPATIENT_CLINIC_OR_DEPARTMENT_OTHER): Payer: Self-pay

## 2024-09-19 ENCOUNTER — Encounter (HOSPITAL_BASED_OUTPATIENT_CLINIC_OR_DEPARTMENT_OTHER): Payer: Self-pay

## 2024-09-22 ENCOUNTER — Other Ambulatory Visit: Payer: Self-pay

## 2024-09-22 ENCOUNTER — Other Ambulatory Visit (HOSPITAL_COMMUNITY)

## 2024-09-22 ENCOUNTER — Other Ambulatory Visit (HOSPITAL_BASED_OUTPATIENT_CLINIC_OR_DEPARTMENT_OTHER): Payer: Self-pay

## 2024-09-22 DIAGNOSIS — Z5181 Encounter for therapeutic drug level monitoring: Secondary | ICD-10-CM

## 2024-09-22 NOTE — Progress Notes (Signed)
 Patient presented to the office for labs, labs were drawn from Right Hand with no issue or complaints . Pt left office alert and ambulatory.

## 2024-09-23 LAB — TSH: TSH: 1.79 u[IU]/mL (ref 0.450–4.500)

## 2024-09-23 LAB — LIPID PANEL
Chol/HDL Ratio: 2.3 ratio (ref 0.0–4.4)
Cholesterol, Total: 227 mg/dL — ABNORMAL HIGH (ref 100–199)
HDL: 100 mg/dL
LDL Chol Calc (NIH): 104 mg/dL — ABNORMAL HIGH (ref 0–99)
Triglycerides: 140 mg/dL (ref 0–149)
VLDL Cholesterol Cal: 23 mg/dL (ref 5–40)

## 2024-09-23 LAB — HEMOGLOBIN A1C
Est. average glucose Bld gHb Est-mCnc: 114 mg/dL
Hgb A1c MFr Bld: 5.6 % (ref 4.8–5.6)

## 2024-09-24 ENCOUNTER — Other Ambulatory Visit (HOSPITAL_BASED_OUTPATIENT_CLINIC_OR_DEPARTMENT_OTHER): Payer: Self-pay

## 2024-09-26 ENCOUNTER — Telehealth (HOSPITAL_COMMUNITY): Payer: Self-pay

## 2024-09-26 NOTE — Telephone Encounter (Signed)
 Patient called in and left voicemail on 09/25/24. Patient reports increased blood pressure while taking Seroquel  50 mg. Patient states her PCP has increased her blood pressure medications and she is still experiencing hypertension. Patient reports her lipid lab came back elevated and believes the medication is the cause. Patient requested to discontinue Seroquel  50 mg and to be prescribed Doxepin.

## 2024-09-29 ENCOUNTER — Ambulatory Visit (AMBULATORY_SURGERY_CENTER): Admitting: Internal Medicine

## 2024-09-29 ENCOUNTER — Encounter: Payer: Self-pay | Admitting: Internal Medicine

## 2024-09-29 ENCOUNTER — Telehealth (HOSPITAL_COMMUNITY): Payer: Self-pay | Admitting: Psychiatry

## 2024-09-29 VITALS — BP 130/78 | HR 70 | Temp 98.2°F | Resp 16 | Ht 63.0 in | Wt 155.0 lb

## 2024-09-29 DIAGNOSIS — R112 Nausea with vomiting, unspecified: Secondary | ICD-10-CM

## 2024-09-29 DIAGNOSIS — Z1211 Encounter for screening for malignant neoplasm of colon: Secondary | ICD-10-CM | POA: Diagnosis not present

## 2024-09-29 DIAGNOSIS — K449 Diaphragmatic hernia without obstruction or gangrene: Secondary | ICD-10-CM | POA: Diagnosis not present

## 2024-09-29 DIAGNOSIS — Q399 Congenital malformation of esophagus, unspecified: Secondary | ICD-10-CM | POA: Diagnosis not present

## 2024-09-29 DIAGNOSIS — A0472 Enterocolitis due to Clostridium difficile, not specified as recurrent: Secondary | ICD-10-CM

## 2024-09-29 DIAGNOSIS — K219 Gastro-esophageal reflux disease without esophagitis: Secondary | ICD-10-CM

## 2024-09-29 DIAGNOSIS — K635 Polyp of colon: Secondary | ICD-10-CM

## 2024-09-29 DIAGNOSIS — D125 Benign neoplasm of sigmoid colon: Secondary | ICD-10-CM

## 2024-09-29 DIAGNOSIS — R197 Diarrhea, unspecified: Secondary | ICD-10-CM

## 2024-09-29 MED ORDER — SODIUM CHLORIDE 0.9 % IV SOLN: 500.0000 mL | INTRAVENOUS | Status: DC

## 2024-09-29 NOTE — Progress Notes (Signed)
 Expand All Collapse All        08/18/2024 Margaret Mccann [id number] 1971/04/04     Chief Complaint: Follow up C. diff   History of Present Illness: Margaret Mccann. Margaret Mccann is a 54 year old female with a past medical history of hypertension, hyperlipidemia, alcohol  use disorder, polysubstance abuse, C. Diff and chronic diarrhea.   I last saw patient in office 06/02/2024 and at that time she continued to have nausea, vomiting, diarrhea and abdominal pain. She endorsed taking about 1 week of the Vancomycin  course previously prescribed for C. difficile then did not take any during her hospital admission or since she was discharged. She described passing 7-8 nonbloody loose stools daily.  She was instructed to complete the prior course of Vancomycin  and to contact our office if her diarrhea persisted.    She contacted our office on 07/04/2024 with persistent diarrhea. Per Dr. Abran, she was prescribed Vancomycin  125 mg p.o. 4 times daily x 1 month (30 days).    She contacted our office 07/16/2024 because she was concerned she had Margaret Mccann syndrome after taking Vanco.  She described having sores and blisters to the corners and inside of her mouth.  Dr. Abran informed the patient that oral Vancomycin  is not systemically absorbed and it would be extremely rare for her to have developed Stevens-Johnson syndrome from this medication.  She was instructed to follow with her PCP regarding of flushing and skin issues.   I Discussed the use of AI scribe software for clinical note transcription with the patient, who gave verbal consent to proceed.   History of Present Illness Margaret Mccann is a 54 year old female who presents for follow-up of C. difficile infection.   She did not complete the second course of vancomycin  due to severe dizziness, lightheadedness, balance issues, and frequent vomiting. These symptoms improved after discontinuing the medication. She took the medication for  approximately seven to nine days before stopping.   Currently, her stools are not consistently normal but have improved significantly, correlating with a healthy diet. Avoiding dairy reduces diarrhea, and her stools are formed when she consumes vegetables. She had two formed stools today after eating sweet potato and salad last night.   She continues to experience nausea two to three days a week, although vomiting has decreased. The last episode of vomiting occurred last Thursday, with undigested food and medication being expelled. She is currently taking Phenergan  as needed for nausea and recently refilled her prescription.   She reports daily heartburn, for which she takes Protonix  twice daily.  No dysphagia.  EGD 05/2017 showed severe reflux esophagitis Barrett's esophagus.   She has ceased using cocaine since her hospitalization and does not consume alcohol .   Her anxiety is well-controlled at this time.         Latest Ref Rng & Units 06/02/2024    3:41 PM 05/05/2024    2:38 PM 03/10/2024    8:47 PM  CBC  WBC 4.0 - 10.5 K/uL 6.0  7.0  10.1   Hemoglobin 12.0 - 15.0 g/dL 88.0  88.3  88.2   Hematocrit 36.0 - 46.0 % 36.0  34.9  34.4   Platelets 150.0 - 400.0 K/uL 396.0  355.0  323         Latest Ref Rng & Units 06/10/2024    1:04 PM 06/02/2024    3:41 PM 05/05/2024    2:38 PM  CMP  Glucose 70 - 99 mg/dL   92  89   BUN  6 - 23 mg/dL   14  16   Creatinine 9.59 - 1.20 mg/dL   9.24  9.31   Sodium 864 - 145 mEq/L   134  134   Potassium 3.5 - 5.1 mEq/L   4.4  5.0   Chloride 96 - 112 mEq/L   101  101   CO2 19 - 32 mEq/L   26  25   Calcium  8.4 - 10.5 mg/dL   9.8  9.8   Total Protein 6.0 - 8.3 g/dL 7.0  7.7  7.2   Total Bilirubin 0.2 - 1.2 mg/dL 0.2  0.3  0.4   Alkaline Phos 39 - 117 U/L 86  95  93   AST 0 - 37 U/L 41  60  35   ALT 0 - 35 U/L 102  110  68     CTAP with contrast 05/25/2024:  SABRA Lower Chest: Small sliding hiatal hernia. Dependent atelectasis.   . Liver: No suspicious focal  findings. Diffuse hepatic steatosis.  . Gallbladder/Biliary: Unremarkable.  SABRA Spleen: No acute findings. Small accessory splenule.  . Pancreas: Homogeneous parenchymal enhancement. Edematous appearance of the pancreas with mild peripancreatic stranding and thickening of the left pararenal fascia. No peripancreatic fluid collection. No focal lesion identified. No pancreatic duct dilation.  . Adrenals: Unremarkable.  . Kidneys: Unremarkable.   . Peritoneum/Mesenteries/Extraperitoneum: No free air. No free fluid or loculated drainable collection. No pathologically enlarged lymph nodes.  . Gastrointestinal tract: No evidence of obstruction. Appendectomy.   . Ureters: Unremarkable.  . Bladder: Unremarkable.  . Reproductive System: Unremarkable.   . Vascular: Unremarkable.  . Musculoskeletal: No acute displaced fractures. Levocurvature of the lumbar spine centered at L3-L4. Polyarticular degenerative changes. No aggressive focal bony lesions. Abdominal wall soft tissues unremarkable.    GI PROCEDURES:   EGD 06/08/2017: - Moderately severe reflux esophagitis. Biopsied. Ringlike stricturing. - Normal esophagus, otherwise.  - Normal stomach.  - Normal examined duodenum. Surgical [P], distal esophagus - esophagitis - SQUAMOUS MUCOSA WITH FOCAL ACUTE INFLAMMATION AND GRANULATION TISSUE CONSISTENT WITH EROSION. - PAS STAIN NEGATIVE FOR FUNGUS. - NO INTESTINAL METAPLASIA, DYSPLASIA OR MALIGNANCY.   Medications Ordered Prior to Encounter        Current Outpatient Medications on File Prior to Visit  Medication Sig Dispense Refill   amLODipine  (NORVASC ) 2.5 MG tablet Take 1 tablet (2.5 mg total) by mouth daily. 90 tablet 1   atorvastatin  (LIPITOR) 20 MG tablet Take 1 tablet (20 mg total) by mouth at bedtime. 90 tablet 1   cloNIDine  (CATAPRES ) 0.2 MG tablet Take 1 tablet (0.2 mg total) by mouth at bedtime. 30 tablet 0   desvenlafaxine  (PRISTIQ ) 25 MG 24 hr tablet Take 3 tablets (75 mg total) by  mouth daily. 90 tablet 0   gabapentin  (NEURONTIN ) 600 MG tablet Take 1 tablet (600 mg total) by mouth in the morning, at noon, in the evening, and at bedtime. 120 tablet 0   mirtazapine  (REMERON  SOL-TAB) 45 MG disintegrating tablet Take 1 tablet (45 mg total) by mouth at bedtime. 30 tablet 0   pantoprazole  (PROTONIX ) 40 MG tablet Take 1 tablet (40 mg total) by mouth 2 (two) times daily before a meal. 180 tablet 1   promethazine  (PHENERGAN ) 25 MG tablet Take 1 tablet (25 mg total) by mouth every 8 (eight) hours as needed for nausea or vomiting. 60 tablet 1   propranolol  (INDERAL ) 10 MG tablet Take 1 tablet (10 mg total) by mouth 3 (three) times daily as needed (anxiety,  at the onset of symptoms). 90 tablet 0   QUEtiapine  (SEROQUEL ) 50 MG tablet Take 1 tablet (50 mg total) by mouth at bedtime. 22 tablet 0   valsartan  (DIOVAN ) 80 MG tablet Take 1 tablet (80 mg total) by mouth daily. **Please keep appointment for additional refills.** 90 tablet 1    No current facility-administered medications on file prior to visit.      Allergies       Allergies  Allergen Reactions   Lamictal [Lamotrigine] Anaphylaxis, Rash and Other (See Comments)      Stevens-Johnson syndrome and fevers, also   Lamictal [Lamotrigine] Anaphylaxis, Rash and Other (See Comments)      Fevers and Stevens-Johnson syndrome, also   Tramadol  Other (See Comments)      Pt had a seizure after taking Tramadol !!   Erythromycin Nausea And Vomiting   Lamisil  [Terbinafine ] Nausea And Vomiting   Geodon  [Ziprasidone  Hcl] Rash   Levetiracetam Anxiety      Current Medications, Allergies, Past Medical History, Past Surgical History, Family History and Social History were reviewed in [employer] record.   Review of Systems:   Constitutional: Negative for fever, sweats, chills or weight loss.  Respiratory: Negative for shortness of breath.   Cardiovascular: Negative for chest pain, palpitations and leg swelling.   Gastrointestinal: See HPI.  Musculoskeletal: Negative for back pain or muscle aches.  Neurological: Negative for dizziness, headaches or paresthesias.    Physical Exam: BP 110/60   Pulse 94   Ht 5' 3 (1.6 m)   Wt 155 lb (70.3 kg)   LMP 10/27/2019   BMI 27.46 kg/m     Wt Readings from Last 3 Encounters:  08/18/24 155 lb (70.3 kg)  08/07/24 150 lb (68 kg)  08/05/24 147 lb 9.6 oz (67 kg)   LMP 10/27/2019  General: 54 year old female in no acute distress. Head: Normocephalic and atraumatic. Eyes: No scleral icterus. Conjunctiva pink . Ears: Normal auditory acuity. Mouth: Dentition intact. No ulcers or lesions.  Lungs: Clear throughout to auscultation. Heart: Regular rate and rhythm, no murmur. Abdomen: Soft, nontender and nondistended. No masses or hepatomegaly. Normal bowel sounds x 4 quadrants.  Rectal: Deferred. Musculoskeletal: Symmetrical with no gross deformities. Extremities: No edema. Neurological: Alert oriented x 4. No focal deficits.  Psychological: Alert and cooperative.  Calm without agitation.  Normal mood and affect   Assessment and Recommendations:   C. Diff, treated with Vancomycin  x 2 weeks August - Sept 2025. Patient had recurrent diarrhea, prescribed a 2nd course of Vancomycin  125mg  po qid x 30 days per Dr. Abran 07/04/2024, she completed 7 to 9 days of this course then she stopped taking it as she was concerned she was experiencing an adverse response to Vanco with dizziness, flushing and sore in her mouth. Since then, she is passing more solid stools than loose.  She passed 2 solid stools earlier today.  No bloody bowel movements. - Patient to contact our office if diarrhea recurs   Colon cancer screening  - Colonoscopy benefits and risks discussed including risk with sedation, risk of bleeding, perforation and infection    Chronic diarrhea - See plan above   GERD, patient endorses having daily heartburn despite taking pantoprazole  40 mg twice daily. -  GERD diet - Continue Pantoprazole  40 mg p.o. twice daily - Start Gaviscon 1 tablespoon p.o. 3 times daily as needed - EGD benefits and risks discussed including risk with sedation, risk of bleeding, perforation and infection    Nausea/vomiting, improving -  Phenergan  12.5 mg 1 tab p.o. every 8 hours as needed for nausea and vomiting   Elevated LFTs.  Hepatic steatosis per CTAP 04/2024. - Hepatic panel - Patient counseled no alcohol    Polysubstance abuse. No alcohol  use for the past 3 months and no cocaine or other drug use since she was discharged home from the hospital 9/5.  - Patient counseled to remain on 100%  alcohol  and cocaine/drug-free      Recent office evaluation with full H&P as outlined above.  No significant interval change.  Now for colonoscopy and upper endoscopy

## 2024-09-29 NOTE — Progress Notes (Signed)
 VS by EJ  Pt's states no medical or surgical changes since previsit or office visit.

## 2024-09-29 NOTE — Telephone Encounter (Signed)
 Patient called and lvm on 09/29/2024 at 8:53 am that she is having difficulty working with Dr. Izella and would like to change providers to one who is not a resident. Patient states she is having medication issues with Seroquel . Even after doubling her BP medication, the Seroquel  seems to be causing problems with her BP and Dr. Izella will not change it. The side effects are problematic. Patient states she will take the first available with a new provider. Patient's number is [phone]. Please advise.

## 2024-09-29 NOTE — Progress Notes (Addendum)
 [facility] MD/PA/NP OP Progress Note   Flossie Wexler  MRN:  [id number]  Assessment and Plan: Patient presents in person for a follow-up appointment.   Since the last visit, insomnia has worsened following discontinuation of Seroquel , occurring in the context of escalating psychosocial stressors including caregiving responsibilities for both parents and an upcoming move. Interim telephone encounters reflect patient concern about Seroquel  related to weight gain, hypertension, and lipid elevation, with strong preference to discontinue despite counseling on alternatives and medication risks. She ultimately agreed to stop Seroquel  and resume as-needed Vistaril , though this has not adequately controlled sleep.  Current presentation is most consistent with stress-related insomnia rather than relapse of a primary mood disorder. Shared decision making today supported discontinuation of Vistaril  and initiation of BuSpar  as needed for anxiety and Doxepin  nightly for sleep, with discussion of risks, benefits, and potential adverse effects, including cumulative sedation and serotonergic burden. Clonidine  was also addressed, as patient previously trialed Prazosin without benefit and reports clonidine  improves PTSD-related hypervigilance, flashbacks, and nightmares. Patient demonstrated understanding and agreed with the plan.  Sleep hygiene and nonpharmacologic strategies were reviewed and reinforced. Will monitor response to BuSpar  and Doxepin  and consider a sleep study if insomnia persists despite these interventions.  # GAD (generalized anxiety disorder) - She is interested in [facility] after she completes moving houses - Continue Pristiq  75 mg daily - Continue Remeron  45 mg at bedtime - Continue gabapentin  600 mg four times daily - Continue propranolol  10 mg TID PRN  - Start BuSpar  5-15 mg daily PRN - Start Doxepin  3 mg nightly  - D/c hydroxyzine  50 mg TID PRN  - D/c Seroquel  50 mg at bedtime   # Insomnia -  Start Doxepin  3 mg nightly  - OTC magnesium  glycinate - In the next visit, may be beneficial to place sleep study referral  # Major depressive disorder, recurrent episode, in partial remission with anxious distress  - Remeron  as above - Pristiq  as above   # PTSD -Continue clonidine  0.2 MG at bedtime  -Propranolol  as above  Chief Complaint: MM follow-up   Interval history:  Patient seen with Dr. Jean.  Patient reports that since the previous visit she has been in the process of moving while also managing psychosocial stressors related to her father being hospitalized and caring for her mother. Patient reports her anxiety was poorly controlled when Atarax  was discontinued and Seroquel  was initiated. Patient reports that after being restarted on Atarax  her anxiety has been improving, though she reports her sleep has worsened since discontinuing Seroquel . Patient reports poor sleep and reports eating cereal at night, and she received counseling to avoid eating late at night.  Patient is requesting a medication regimen that includes doxepin  with BuSpar  daily. Patient reports that clonidine  is prescribed for PTSD-related symptoms. Patient reports engaging in nonpharmacologic strategies including using a warming device at night, essential oils, and plans to resume guided meditation. Patient reports considering stretching exercises and binaural beats. Patient reports taking magnesium  glycinate at bedtime and a vitamin B12 supplement.  Patient reports no current suicidal ideation, homicidal ideation, or auditory or visual hallucinations.  Past Psychiatric History:  MDD , anxiety, PTSD, Substance abuse (cocaine, alcohol ) Currently smoking 5 cigarettes daily Alcohol : most recently 04/2023 Illicit: Denies current cocaine use (last time was 3-4 years ago).  Denies other current illicit substance use.   Family Psychiatric History: Sister - committed suicide in 2005 by overdosing. Fiance committed  suicide.  Social History: Living: living alone Occupation: denies Children: yes, one  daughter and one son Support: sponsor and group   Past Medical History:  Past Medical History:  Diagnosis Date   Alcohol  withdrawal seizure with complication, with unspecified complication (HCC) 12/23/2020   Anxiety    Bipolar 1 disorder (HCC)    C. difficile colitis    Depression    GERD (gastroesophageal reflux disease)    HLD (hyperlipidemia)    HTN (hypertension)    Intentional drug overdose (HCC) 08/04/2020   Major depressive disorder, recurrent episode with anxious distress 08/11/2020   Morgellons syndrome    MRSA (methicillin resistant Staphylococcus aureus)    Suicide attempt (HCC)    Suicide by drug overdose (HCC) 08/11/2020   Tricuspid valve regurgitation 12/18/2020    Past Surgical History:  Procedure Laterality Date   APPENDECTOMY      Family History:  Family History  Problem Relation Age of Onset   Heart Problems Mother        abnormal beats   Skin cancer Mother    Heart attack Father 58   Stroke Father    Diabetes Father    Severe combined immunodeficiency Sister    Breast cancer Paternal Aunt    Hypertension Paternal Grandfather    COPD Paternal Grandfather    Ulcerative colitis Neg Hx    Esophageal cancer Neg Hx    Colon cancer Neg Hx    Stomach cancer Neg Hx    Rectal cancer Neg Hx     Social History:  Social History   Socioeconomic History   Marital status: Single    Spouse name: Not on file   Number of children: 2   Years of education: Not on file   Highest education level: Bachelor's degree (e.g., BA, AB, BS)  Occupational History   Not on file  Tobacco Use   Smoking status: Every Day    Current packs/day: 0.25    Average packs/day: 0.3 packs/day for 5.0 years (1.3 ttl pk-yrs)    Types: Cigarettes   Smokeless tobacco: Never  Vaping Use   Vaping status: Never Used  Substance and Sexual Activity   Alcohol  use: Not Currently    Comment: occas    Drug use: Not Currently    Types: Cocaine    Comment: Hx of drug abuse   Sexual activity: Not Currently    Birth control/protection: Pill  Other Topics Concern   Not on file  Social History Narrative   ** Merged History Encounter **       Social Drivers of Health   Tobacco Use: High Risk (09/29/2024)   Patient History    Smoking Tobacco Use: Every Day    Smokeless Tobacco Use: Never    Passive Exposure: Not on file  Financial Resource Strain: High Risk (08/06/2024)   Overall Financial Resource Strain (CARDIA)    Difficulty of Paying Living Expenses: Very hard  Food Insecurity: Low Risk (05/25/2024)   Received from [facility]   Epic    Within the past 12 months, you worried that your food would run out before you got money to buy more: Never true    Within the past 12 months, the food you bought just didn't last and you didn't have money to get more. : Never true  Recent Concern: Food Insecurity - Food Insecurity Present (03/20/2024)   Epic    Worried About Programme Researcher, Broadcasting/film/video in the Last Year: Sometimes true    Ran Out of Food in the Last Year: Sometimes true  Transportation Needs: No Transportation  Needs (05/25/2024)   Received from [facility]   Transportation    In the past 12 months, has lack of reliable transportation kept you from medical appointments, meetings, work or from getting things needed for daily living? : No  Physical Activity: Insufficiently Active (03/20/2024)   Exercise Vital Sign    Days of Exercise per Week: 1 day    Minutes of Exercise per Session: 20 min  Stress: Stress Concern Present (08/06/2024)   [employer] of Occupational Health - Occupational Stress Questionnaire    Feeling of Stress: Very much  Social Connections: Socially Isolated (03/20/2024)   Social Connection and Isolation Panel    Frequency of Communication with Friends and Family: More than three times a week    Frequency of Social Gatherings with Friends and Family: More than  three times a week    Attends Religious Services: Never    Database Administrator or Organizations: No    Attends Engineer, Structural: Not on file    Marital Status: Divorced  Depression (PHQ2-9): High Risk (08/07/2024)   Depression (PHQ2-9)    PHQ-2 Score: 11  Alcohol  Screen: Low Risk (07/18/2022)   Alcohol  Screen    Last Alcohol  Screening Score (AUDIT): 6  Housing: Unknown (08/05/2024)   Epic    Unable to Pay for Housing in the Last Year: No    Number of Times Moved in the Last Year: Not on file    Homeless in the Last Year: No  Utilities: Low Risk (05/25/2024)   Received from [facility]   Utilities    In the past 12 months has the electric, gas, oil, or water  company threatened to shut off services in your home? : No  Health Literacy: Not on file    Allergies:  Allergies  Allergen Reactions   Lamictal [Lamotrigine] Anaphylaxis, Rash and Other (See Comments)    Stevens-Johnson syndrome and fevers, also   Lamictal [Lamotrigine] Anaphylaxis, Rash and Other (See Comments)    Fevers and Stevens-Johnson syndrome, also   Tramadol  Other (See Comments)    Pt had a seizure after taking Tramadol !!   Erythromycin Nausea And Vomiting   Geodon  [Ziprasidone  Hcl] Rash   Lamisil  [Terbinafine ] Nausea And Vomiting   Levetiracetam Anxiety    Metabolic Disorder Labs: Lab Results  Component Value Date   HGBA1C 5.6 09/22/2024   MPG 116.89 07/20/2022   MPG 114.02 12/19/2020   No results found for: PROLACTIN Lab Results  Component Value Date   CHOL 227 (H) 09/22/2024   TRIG 140 09/22/2024   HDL 100 09/22/2024   CHOLHDL 2.3 09/22/2024   VLDL 23 07/20/2022   LDLCALC 104 (H) 09/22/2024   LDLCALC 95 06/24/2024   Lab Results  Component Value Date   TSH 1.790 09/22/2024   TSH 3.544 07/20/2022    Therapeutic Level Labs: No results found for: LITHIUM No results found for: VALPROATE No results found for: CBMZ  Current Medications: Current Outpatient  Medications  Medication Sig Dispense Refill   atorvastatin  (LIPITOR) 20 MG tablet Take 1 tablet (20 mg total) by mouth at bedtime. 90 tablet 1   cloNIDine  (CATAPRES ) 0.2 MG tablet Take 1 tablet (0.2 mg total) by mouth at bedtime. 30 tablet 1   desvenlafaxine  (PRISTIQ ) 25 MG 24 hr tablet Take 3 tablets (75 mg total) by mouth daily. 90 tablet 1   gabapentin  (NEURONTIN ) 600 MG tablet Take 1 tablet (600 mg total) by mouth in the morning, at noon, in the evening, and at  bedtime. 120 tablet 1   methocarbamol  (ROBAXIN ) 500 MG tablet Take 1 tablet (500 mg total) by mouth every 8 (eight) hours as needed for muscle spasms. 90 tablet 2   mirtazapine  (REMERON  SOL-TAB) 45 MG disintegrating tablet Take 1 tablet (45 mg total) by mouth at bedtime. 30 tablet 1   pantoprazole  (PROTONIX ) 40 MG tablet Take 1 tablet (40 mg total) by mouth 2 (two) times daily before a meal. 180 tablet 1   predniSONE  (DELTASONE ) 20 MG tablet Take 1 tablet (20 mg total) by mouth daily with breakfast. 5 tablet 0   promethazine  (PHENERGAN ) 12.5 MG tablet Take 1 tablet (12.5 mg total) by mouth every 8 (eight) hours as needed for nausea or vomiting. 60 tablet 1   propranolol  (INDERAL ) 10 MG tablet Take 1 tablet (10 mg total) by mouth 3 (three) times daily as needed for anxiety at the onset of symptoms. 90 tablet 1   QUEtiapine  (SEROQUEL ) 50 MG tablet Take 1 tablet (50 mg total) by mouth at bedtime. 30 tablet 1   valsartan  (DIOVAN ) 160 MG tablet Take 1 tablet (160 mg total) by mouth daily. 90 tablet 1   No current facility-administered medications for this visit.    Objective  Psychiatric Specialty Exam: General Appearance: appears at stated age, casually dressed and groomed   Behavior: pleasant and cooperative   Psychomotor Activity: some fidgeting in the chair, states that she does this when nervous  Eye Contact: fair  Speech: normal amount, volume and fluency    Mood: anxious Affect: congruent  Thought Process: linear, goal  directed, no circumstantial or tangential thought process noted, no racing thoughts or flight of ideas  Descriptions of Associations: intact   Thought Content Hallucinations: denies AH, VH , does not appear responding to stimuli  Delusions: no paranoia, delusions of control, grandeur, ideas of reference, thought broadcasting, and magical thinking  Suicidal Thoughts: denies SI, intention, plan  Homicidal Thoughts: denies HI, intention, plan   Alertness/Orientation: alert and fully oriented   Insight: fair Judgment: fair  Memory: intact   Executive Functions  Concentration: intact  Attention Span: fair  Recall: intact  Fund of Knowledge: fair   Physical Exam  General: Pleasant, well-appearing. No acute distress. Pulmonary: Normal effort. No wheezing or rales. Skin: No obvious rash or lesions. Neuro: A&Ox3.No focal deficit.  Review of Systems  No reported symptoms   Screenings: AIMS    Flowsheet Row [facility]  AIMS Total Score 0   AUDIT    Flowsheet Row [facility] Ad[facility] (Dis[facility] Ad[facility] (Dis[facility]  Alcohol  Use Disorder Identification Test Final Score (AUDIT) 6 11 0   GAD-7    Flowsheet Row Clinical Support from 08/07/2024 in [facility] Counselor from 08/06/2024 in [facility] Office Visit from 08/05/2024 in [facility] [place]. [facility] Office Visit from 06/24/2024 in [facility] - [facility] Dept Of [place]. [facility] Office Visit from 04/14/2024 in [facility]  Total GAD-7 Score 16 16 16 14 14    PHQ2-9    Flowsheet Row  Clinical Support from 08/07/2024 in [facility] Counselor from 08/06/2024 in [facility] Office Visit from 08/05/2024  in [facility] [place] - A Dept Of [place]. [facility] Office Visit from 06/24/2024 in [facility] Dept Of [place]. [facility] Office Visit from 04/14/2024 in [facility]  PHQ-2 Total Score 2 6 4 3 3   PHQ-9 Total Score 11 17 12 9 12    Flowsheet Row Clinical Support from 08/07/2024 in [facility] Counselor from 08/06/2024 in [facility] Office Visit from 04/14/2024 in [facility]  C-SSRS RISK CATEGORY Moderate Risk Moderate Risk Moderate Risk    Ismael Franco, MD PGY-3 Psychiatry Resident

## 2024-09-29 NOTE — Progress Notes (Signed)
 Report to PACU, RN, vss, BBS= Clear.

## 2024-09-29 NOTE — Op Note (Signed)
 [facility] Patient Name: Margaret Mccann Procedure Date: 09/29/2024 10:32 AM MRN: [id number] Endoscopist: Norleen SAILOR. Abran , MD, [id number] Age: 54 Referring MD:  Date of Birth: [birth year] Gender: Female Account #: [id number] Procedure:                Upper GI endoscopy with biopsies Indications:              Esophageal reflux, Nausea with vomiting, diarrhea Medicines:                Monitored Anesthesia Care Procedure:                Pre-Anesthesia Assessment:                           - Prior to the procedure, a History and Physical                            was performed, and patient medications and                            allergies were reviewed. The patient's tolerance of                            previous anesthesia was also reviewed. The risks                            and benefits of the procedure and the sedation                            options and risks were discussed with the patient.                            All questions were answered, and informed consent                            was obtained. Prior Anticoagulants: The patient has                            taken no anticoagulant or antiplatelet agents. ASA                            Grade Assessment: II - A patient with mild systemic                            disease. After reviewing the risks and benefits,                            the patient was deemed in satisfactory condition to                            undergo the procedure.                           After obtaining informed consent, the endoscope was  passed under direct vision. Throughout the                            procedure, the patient's blood pressure, pulse, and                            oxygen saturations were monitored continuously. The                            GIF [id number] #[id number] was introduced through the                            mouth, and advanced to the second part of duodenum.                             The upper GI endoscopy was accomplished without                            difficulty. The patient tolerated the procedure                            well. Scope In: Scope Out: Findings:                 The esophagus was somewhat tortuous but otherwise                            normal. No active inflammation.                           The stomach revealed a large hiatal hernia. Stomach                            was otherwise normal.                           The examined duodenum was normal. Biopsies were                            taken with a cold forceps for histology to evaluate                            for sprue or other abnormalities.                           The cardia and gastric fundus were normal on                            retroflexion. Complications:            No immediate complications. Estimated Blood Loss:     Estimated blood loss: none. Impression:               1. GERD                           2. Large hiatal hernia  3. Otherwise unremarkable EGD. Status post biopsies. Recommendation:           - Patient has a contact number available for                            emergencies. The signs and symptoms of potential                            delayed complications were discussed with the                            patient. Return to normal activities tomorrow.                            Written discharge instructions were provided to the                            patient.                           - Resume previous diet. Reflux precautions.                           - Continue present medications.                           - Await pathology results. Norleen SAILOR. Abran, MD 09/29/2024 11:13:11 AM This report has been signed electronically.

## 2024-09-29 NOTE — Op Note (Signed)
 [facility] Patient Name: Margaret Mccann Procedure Date: 09/29/2024 10:32 AM MRN: [id number] Endoscopist: Norleen SAILOR. Abran , MD, [id number] Age: 54 Referring MD:  Date of Birth: 11/24/1970 Gender: Female Account #: [id number] Procedure:                Colonoscopy with cold snare polypectomy x 1; with                            biopsies Indications:              Screening for colorectal malignant neoplasm,                            Incidental diarrhea noted Medicines:                Monitored Anesthesia Care Procedure:                Pre-Anesthesia Assessment:                           - Prior to the procedure, a History and Physical                            was performed, and patient medications and                            allergies were reviewed. The patient's tolerance of                            previous anesthesia was also reviewed. The risks                            and benefits of the procedure and the sedation                            options and risks were discussed with the patient.                            All questions were answered, and informed consent                            was obtained. Prior Anticoagulants: The patient has                            taken no anticoagulant or antiplatelet agents. ASA                            Grade Assessment: II - A patient with mild systemic                            disease. After reviewing the risks and benefits,                            the patient was deemed in satisfactory condition to  undergo the procedure.                           After obtaining informed consent, the colonoscope                            was passed under direct vision. Throughout the                            procedure, the patient's blood pressure, pulse, and                            oxygen saturations were monitored continuously. The                            CF [id number] #[id number] was introduced  through the anus                            and advanced to the the cecum, identified by                            appendiceal orifice and ileocecal valve. The                            ileocecal valve, appendiceal orifice, and rectum                            were photographed. The quality of the bowel                            preparation was excellent. The colonoscopy was                            performed without difficulty. The patient tolerated                            the procedure well. The bowel preparation used was                            SUPREP via split dose instruction. Scope In: 10:40:17 AM Scope Out: 10:54:28 AM Scope Withdrawal Time: 0 hours 9 minutes 42 seconds  Total Procedure Duration: 0 hours 14 minutes 11 seconds  Findings:                 A 3 mm polyp was found in the sigmoid colon. The                            polyp was removed with a cold snare. Resection and                            retrieval were complete.                           The exam was otherwise without abnormality on  direct and retroflexion views.                           Biopsies for histology were taken with a cold                            forceps from the entire colon for evaluation of                            microscopic colitis. Complications:            No immediate complications. Estimated blood loss:                            None. Estimated Blood Loss:     Estimated blood loss: none. Impression:               - One 3 mm polyp in the sigmoid colon, removed with                            a cold snare. Resected and retrieved.                           - The examination was otherwise normal on direct                            and retroflexion views.                           - Biopsies were taken with a cold forceps from the                            entire colon for evaluation of microscopic colitis. Recommendation:           - Repeat  colonoscopy in 7-10 years for surveillance.                           - Patient has a contact number available for                            emergencies. The signs and symptoms of potential                            delayed complications were discussed with the                            patient. Return to normal activities tomorrow.                            Written discharge instructions were provided to the                            patient.                           - Resume previous diet.                           -  Continue present medications.                           - Await pathology results. Norleen SAILOR. Abran, MD 09/29/2024 11:01:31 AM This report has been signed electronically.

## 2024-09-29 NOTE — Progress Notes (Signed)
 Called to room to assist during endoscopic procedure.  Patient ID and intended procedure confirmed with present staff. Received instructions for my participation in the procedure from the performing physician.

## 2024-09-29 NOTE — Patient Instructions (Signed)
Await pathology results.  Handout on polyps provided.  YOU HAD AN ENDOSCOPIC PROCEDURE TODAY AT THE [facility]:   Refer to the procedure report that was given to you for any specific questions about what was found during the examination.  If the procedure report does not answer your questions, please call your gastroenterologist to clarify.  If you requested that your care partner not be given the details of your procedure findings, then the procedure report has been included in a sealed envelope for you to review at your convenience later.  YOU SHOULD EXPECT: Some feelings of bloating in the abdomen. Passage of more gas than usual.  Walking can help get rid of the air that was put into your GI tract during the procedure and reduce the bloating. If you had a lower endoscopy (such as a colonoscopy or flexible sigmoidoscopy) you may notice spotting of blood in your stool or on the toilet paper. If you underwent a bowel prep for your procedure, you may not have a normal bowel movement for a few days.  Please Note:  You might notice some irritation and congestion in your nose or some drainage.  This is from the oxygen used during your procedure.  There is no need for concern and it should clear up in a day or so.  SYMPTOMS TO REPORT IMMEDIATELY:  Following lower endoscopy (colonoscopy or flexible sigmoidoscopy):  Excessive amounts of blood in the stool  Significant tenderness or worsening of abdominal pains  Swelling of the abdomen that is new, acute  Fever of 100F or higher  Following upper endoscopy (EGD)  Vomiting of blood or coffee ground material  New chest pain or pain under the shoulder blades  Painful or persistently difficult swallowing  New shortness of breath  Fever of 100F or higher  Black, tarry-looking stools  For urgent or emergent issues, a gastroenterologist can be reached at any hour by calling [phone]. Do not use MyChart messaging for urgent concerns.     DIET:  We do recommend a small meal at first, but then you may proceed to your regular diet.  Drink plenty of fluids but you should avoid alcoholic beverages for 24 hours.  ACTIVITY:  You should plan to take it easy for the rest of today and you should NOT DRIVE or use heavy machinery until tomorrow (because of the sedation medicines used during the test).    FOLLOW UP: Our staff will call the number listed on your records the next business day following your procedure.  We will call around 7:15- 8:00 am to check on you and address any questions or concerns that you may have regarding the information given to you following your procedure. If we do not reach you, we will leave a message.     If any biopsies were taken you will be contacted by phone or by letter within the next 1-3 weeks.  Please call us at [phone] if you have not heard about the biopsies in 3 weeks.    SIGNATURES/CONFIDENTIALITY: You and/or your care partner have signed paperwork which will be entered into your electronic medical record.  These signatures attest to the fact that that the information above on your After Visit Summary has been reviewed and is understood.  Full responsibility of the confidentiality of this discharge information lies with you and/or your care-partner.  

## 2024-09-30 ENCOUNTER — Other Ambulatory Visit (HOSPITAL_COMMUNITY): Payer: Self-pay

## 2024-09-30 ENCOUNTER — Telehealth (HOSPITAL_COMMUNITY): Payer: Self-pay

## 2024-09-30 ENCOUNTER — Other Ambulatory Visit (HOSPITAL_BASED_OUTPATIENT_CLINIC_OR_DEPARTMENT_OTHER): Payer: Self-pay

## 2024-09-30 ENCOUNTER — Telehealth: Payer: Self-pay

## 2024-09-30 NOTE — Telephone Encounter (Signed)
 Post procedure follow up call, no answer

## 2024-09-30 NOTE — Telephone Encounter (Signed)
 Hi Margaret Mccann, this is the same patient we discussed yesterday. It seems that pt does have appointment with resident. Thanks

## 2024-09-30 NOTE — Telephone Encounter (Signed)
 Patient is requesting to start back on Hydroxyzine  50 mg as she no longer can take the Seroquel .

## 2024-10-01 ENCOUNTER — Ambulatory Visit: Payer: Self-pay | Admitting: Internal Medicine

## 2024-10-01 ENCOUNTER — Other Ambulatory Visit: Payer: Self-pay | Admitting: Family Medicine

## 2024-10-01 ENCOUNTER — Other Ambulatory Visit (HOSPITAL_BASED_OUTPATIENT_CLINIC_OR_DEPARTMENT_OTHER): Payer: Self-pay

## 2024-10-01 ENCOUNTER — Telehealth (HOSPITAL_COMMUNITY): Payer: Self-pay | Admitting: Psychiatry

## 2024-10-01 LAB — SURGICAL PATHOLOGY

## 2024-10-01 MED ORDER — HYDROXYZINE HCL 50 MG PO TABS
50.0000 mg | ORAL_TABLET | Freq: Three times a day (TID) | ORAL | 0 refills | Status: DC | PRN
  Filled 2024-10-01: qty 30, 10d supply, fill #0

## 2024-10-01 MED ORDER — TIZANIDINE HCL 4 MG PO TABS
4.0000 mg | ORAL_TABLET | Freq: Three times a day (TID) | ORAL | 1 refills | Status: DC | PRN
  Filled 2024-10-01: qty 60, 20d supply, fill #0

## 2024-10-01 NOTE — Telephone Encounter (Signed)
 Patient called back and is saying that she has an urgent need to speak with a provider. She said is out of Vistaril . She take 3 50 mg tabs/day. She says she is not sleeping. She is stating that her BP is dangerously elevated and he cholesterol has climbed above 300 because of the Seroquel . She is also noting 15 pounds of weight gain. Her pharmacy is [facility] on [address] in [place]. Please advise. Thank you.

## 2024-10-01 NOTE — Telephone Encounter (Signed)
 Can you please provide her with the locations for the mobile unit so she can be seen urgently for her symptoms? Thank you, EN

## 2024-10-02 ENCOUNTER — Other Ambulatory Visit (HOSPITAL_BASED_OUTPATIENT_CLINIC_OR_DEPARTMENT_OTHER): Payer: Self-pay

## 2024-10-02 ENCOUNTER — Other Ambulatory Visit: Payer: Self-pay

## 2024-10-03 ENCOUNTER — Other Ambulatory Visit (HOSPITAL_BASED_OUTPATIENT_CLINIC_OR_DEPARTMENT_OTHER): Payer: Self-pay

## 2024-10-06 ENCOUNTER — Other Ambulatory Visit (HOSPITAL_BASED_OUTPATIENT_CLINIC_OR_DEPARTMENT_OTHER): Payer: Self-pay

## 2024-10-09 ENCOUNTER — Other Ambulatory Visit (HOSPITAL_BASED_OUTPATIENT_CLINIC_OR_DEPARTMENT_OTHER): Payer: Self-pay

## 2024-10-10 ENCOUNTER — Other Ambulatory Visit: Payer: Self-pay | Admitting: Family Medicine

## 2024-10-10 ENCOUNTER — Other Ambulatory Visit: Payer: Self-pay

## 2024-10-10 ENCOUNTER — Other Ambulatory Visit (HOSPITAL_BASED_OUTPATIENT_CLINIC_OR_DEPARTMENT_OTHER): Payer: Self-pay

## 2024-10-10 ENCOUNTER — Other Ambulatory Visit (HOSPITAL_COMMUNITY): Payer: Self-pay

## 2024-10-10 ENCOUNTER — Ambulatory Visit (HOSPITAL_COMMUNITY): Admitting: Psychiatry

## 2024-10-10 VITALS — BP 169/97 | Wt 167.0 lb

## 2024-10-10 DIAGNOSIS — G4700 Insomnia, unspecified: Secondary | ICD-10-CM | POA: Diagnosis not present

## 2024-10-10 DIAGNOSIS — F411 Generalized anxiety disorder: Secondary | ICD-10-CM

## 2024-10-10 DIAGNOSIS — Z87898 Personal history of other specified conditions: Secondary | ICD-10-CM | POA: Diagnosis not present

## 2024-10-10 DIAGNOSIS — I10 Essential (primary) hypertension: Secondary | ICD-10-CM | POA: Diagnosis not present

## 2024-10-10 DIAGNOSIS — F3341 Major depressive disorder, recurrent, in partial remission: Secondary | ICD-10-CM

## 2024-10-10 MED ORDER — PROPRANOLOL HCL 10 MG PO TABS
10.0000 mg | ORAL_TABLET | Freq: Three times a day (TID) | ORAL | 1 refills | Status: DC | PRN
  Filled 2024-10-10 – 2024-10-27 (×3): qty 90, 30d supply, fill #0

## 2024-10-10 MED ORDER — DESVENLAFAXINE SUCCINATE ER 25 MG PO TB24
75.0000 mg | ORAL_TABLET | Freq: Every day | ORAL | 1 refills | Status: DC
  Filled 2024-10-10 – 2024-10-24 (×4): qty 90, 30d supply, fill #0

## 2024-10-10 MED ORDER — CLONIDINE HCL 0.2 MG PO TABS
0.2000 mg | ORAL_TABLET | Freq: Every day | ORAL | 1 refills | Status: DC
  Filled 2024-10-10 – 2024-10-15 (×2): qty 30, 30d supply, fill #0

## 2024-10-10 MED ORDER — TIZANIDINE HCL 4 MG PO TABS
4.0000 mg | ORAL_TABLET | Freq: Three times a day (TID) | ORAL | 1 refills | Status: AC | PRN
  Filled 2024-10-10 – 2024-10-16 (×3): qty 60, 20d supply, fill #0
  Filled 2024-10-16 – 2024-10-28 (×3): qty 60, 20d supply, fill #1

## 2024-10-10 MED ORDER — GABAPENTIN 600 MG PO TABS
600.0000 mg | ORAL_TABLET | Freq: Four times a day (QID) | ORAL | 1 refills | Status: DC
  Filled ????-??-??: fill #0

## 2024-10-10 MED ORDER — BUSPIRONE HCL 5 MG PO TABS
5.0000 mg | ORAL_TABLET | Freq: Every day | ORAL | 0 refills | Status: DC | PRN
  Filled 2024-10-10: qty 90, 30d supply, fill #0

## 2024-10-10 MED ORDER — PROMETHAZINE HCL 25 MG PO TABS
25.0000 mg | ORAL_TABLET | Freq: Three times a day (TID) | ORAL | 1 refills | Status: AC | PRN
  Filled 2024-10-10 – 2024-10-24 (×3): qty 30, 10d supply, fill #0

## 2024-10-10 MED ORDER — MIRTAZAPINE 45 MG PO TBDP
45.0000 mg | ORAL_TABLET | Freq: Every day | ORAL | 1 refills | Status: DC
  Filled 2024-10-10 – 2024-10-27 (×2): qty 30, 30d supply, fill #0
  Filled 2024-10-27: qty 4, 4d supply, fill #0
  Filled 2024-10-27: qty 30, 30d supply, fill #0
  Filled 2024-10-30: qty 26, 26d supply, fill #1
  Filled ????-??-??: fill #0

## 2024-10-10 MED ORDER — DOXEPIN HCL 3 MG PO TABS
3.0000 mg | ORAL_TABLET | Freq: Every day | ORAL | 1 refills | Status: AC
  Filled 2024-10-10 (×2): qty 30, 30d supply, fill #0
  Filled 2024-10-10: qty 3, 3d supply, fill #0
  Filled 2024-10-10: qty 4, 4d supply, fill #0
  Filled 2024-10-10: qty 30, 30d supply, fill #0

## 2024-10-13 ENCOUNTER — Other Ambulatory Visit: Payer: Self-pay

## 2024-10-13 ENCOUNTER — Other Ambulatory Visit (HOSPITAL_COMMUNITY): Payer: Self-pay

## 2024-10-13 ENCOUNTER — Other Ambulatory Visit (HOSPITAL_BASED_OUTPATIENT_CLINIC_OR_DEPARTMENT_OTHER): Payer: Self-pay

## 2024-10-14 ENCOUNTER — Telehealth (HOSPITAL_COMMUNITY): Payer: Self-pay

## 2024-10-14 ENCOUNTER — Other Ambulatory Visit (HOSPITAL_BASED_OUTPATIENT_CLINIC_OR_DEPARTMENT_OTHER): Payer: Self-pay

## 2024-10-14 NOTE — Telephone Encounter (Signed)
 Patient called in stating that Margaret Mccann doxepin  prescription well not be covered by insurance. Per pt she contacted medicaid and they would like for Margaret Mccann to try Temazepam or Zolpidem  instead. Pt reports she is in desperate need of medications as she has not slept in days. Prior Authorization was completed yesterday and decision has not comeback from [employer].

## 2024-10-15 ENCOUNTER — Other Ambulatory Visit: Payer: Self-pay

## 2024-10-15 ENCOUNTER — Other Ambulatory Visit (HOSPITAL_COMMUNITY): Payer: Self-pay

## 2024-10-15 ENCOUNTER — Other Ambulatory Visit (HOSPITAL_COMMUNITY): Payer: Self-pay | Admitting: Psychiatry

## 2024-10-15 ENCOUNTER — Other Ambulatory Visit (HOSPITAL_BASED_OUTPATIENT_CLINIC_OR_DEPARTMENT_OTHER): Payer: Self-pay

## 2024-10-15 ENCOUNTER — Encounter (HOSPITAL_COMMUNITY): Payer: Self-pay | Admitting: Psychiatry

## 2024-10-15 MED ORDER — DOXEPIN HCL 10 MG PO CAPS
10.0000 mg | ORAL_CAPSULE | Freq: Every day | ORAL | 0 refills | Status: DC
  Filled 2024-10-15: qty 30, 30d supply, fill #0

## 2024-10-15 NOTE — Progress Notes (Signed)
 I received a message from our nursing staff regarding this patient's prior authorization of Doxepin  3 mg being denied. I spoke with supervising attending Dr. Carvin regarding the matter and it was discussed that patient can start on Doxepin  10 mg nightly. I sent a 30 day supply of this medication to the patient's pharmacy and the nursing staff communicated this change to the patient.

## 2024-10-16 ENCOUNTER — Other Ambulatory Visit (HOSPITAL_BASED_OUTPATIENT_CLINIC_OR_DEPARTMENT_OTHER): Payer: Self-pay

## 2024-10-16 ENCOUNTER — Encounter (HOSPITAL_COMMUNITY): Admitting: Psychiatry

## 2024-10-16 ENCOUNTER — Other Ambulatory Visit: Payer: Self-pay

## 2024-10-16 ENCOUNTER — Other Ambulatory Visit (HOSPITAL_COMMUNITY): Payer: Self-pay

## 2024-10-20 ENCOUNTER — Other Ambulatory Visit (HOSPITAL_BASED_OUTPATIENT_CLINIC_OR_DEPARTMENT_OTHER): Payer: Self-pay
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
## 2024-10-23 ENCOUNTER — Other Ambulatory Visit (HOSPITAL_COMMUNITY): Payer: Self-pay

## 2024-10-24 ENCOUNTER — Other Ambulatory Visit (HOSPITAL_COMMUNITY): Payer: Self-pay

## 2024-10-27 ENCOUNTER — Other Ambulatory Visit (HOSPITAL_BASED_OUTPATIENT_CLINIC_OR_DEPARTMENT_OTHER): Payer: Self-pay

## 2024-10-27 ENCOUNTER — Other Ambulatory Visit: Payer: Self-pay

## 2024-10-28 ENCOUNTER — Other Ambulatory Visit (HOSPITAL_BASED_OUTPATIENT_CLINIC_OR_DEPARTMENT_OTHER): Payer: Self-pay

## 2024-10-29 ENCOUNTER — Telehealth: Payer: Self-pay

## 2024-10-29 ENCOUNTER — Other Ambulatory Visit: Payer: Self-pay

## 2024-10-29 ENCOUNTER — Ambulatory Visit: Payer: Self-pay | Admitting: Family Medicine

## 2024-10-29 ENCOUNTER — Other Ambulatory Visit (HOSPITAL_BASED_OUTPATIENT_CLINIC_OR_DEPARTMENT_OTHER): Payer: Self-pay

## 2024-10-29 ENCOUNTER — Encounter: Payer: Self-pay | Admitting: Family Medicine

## 2024-10-29 VITALS — BP 110/75 | HR 81 | Temp 97.9°F | Ht 63.0 in | Wt 172.2 lb

## 2024-10-29 DIAGNOSIS — R21 Rash and other nonspecific skin eruption: Secondary | ICD-10-CM

## 2024-10-29 DIAGNOSIS — F1491 Cocaine use, unspecified, in remission: Secondary | ICD-10-CM

## 2024-10-29 DIAGNOSIS — I10 Essential (primary) hypertension: Secondary | ICD-10-CM | POA: Diagnosis not present

## 2024-10-29 DIAGNOSIS — M2550 Pain in unspecified joint: Secondary | ICD-10-CM

## 2024-10-29 DIAGNOSIS — R232 Flushing: Secondary | ICD-10-CM

## 2024-10-29 MED ORDER — TIZANIDINE HCL 4 MG PO TABS
4.0000 mg | ORAL_TABLET | Freq: Three times a day (TID) | ORAL | 2 refills | Status: AC | PRN
  Filled 2024-10-29: qty 90, 30d supply, fill #0

## 2024-10-29 NOTE — Telephone Encounter (Signed)
 Can you please inform her accordingly?  I have placed metanephrine order.  Thank you.

## 2024-10-29 NOTE — Progress Notes (Addendum)
 "  Subjective:  Patient ID: Margaret Mccann, female    DOB: Apr 30, 1971  Age: 54 y.o. MRN: [id number]  CC: lupus concerns (Wants to discuss lupus symptoms)     Discussed the use of AI scribe software for clinical note transcription with the patient, who gave verbal consent to proceed.  History of Present Illness Margaret Mccann is a 54 year old female with a history of PTSD, major depression, severe anxiety, previous suicidal attempts, substance abuse (nicotine , cocaine, alcohol ), and hypertension, hyperlipidemia, pancreatitis and Clostridium difficile infection  who presents with concerns of possible lupus and high blood pressure.  She reports episodic flushing with her face feeling hot and sweaty, sometimes with a butterfly rash across her nose and cheeks. She has joint pain and swelling mainly in her hands and wrists and occasionally in her knees. Her hands sometimes blanch and become numb, with some fingers turning solid white. She has a prior positive ANA per patient and an aunt and cousin with Sjogren's. She also has intermittent mouth sores, transient rashes, and occasional fevers up to 103F. She notes new chest pain about once a week which she associates with lupus.  Chest pain is absent at the moment.  Her home blood pressure readings are often very high, up to 192/121 mmHg, even after taking medications. She is taking propranolol  10 mg three times daily and valsartan  160 mg daily. She has also been taking tizanidine  4 mg three times daily recently which was prescribed for muscle spasms, and feels her blood pressure is not controlled without it.  She has therefore ran out early and is needing a new prescription be sent to her pharmacy.    She has fatigue, poor sleep, and anxiety and feels unrested. She has nausea and vomiting despite Phenergan  and is under GI care for this.. She has headaches that coincide with blood pressure spikes. She was hospitalized for acute pancreatitis in  August. She smokes about four cigarettes a day and denies current cocaine, marijuana, or alcohol  use.   She has been amenorrheic for two years after several years of irregular periods.    Past Medical History:  Diagnosis Date   Alcohol  withdrawal seizure with complication, with unspecified complication (HCC) 12/23/2020   Anxiety    Bipolar 1 disorder (HCC)    C. difficile colitis    Depression    GERD (gastroesophageal reflux disease)    HLD (hyperlipidemia)    HTN (hypertension)    Intentional drug overdose (HCC) 08/04/2020   Major depressive disorder, recurrent episode with anxious distress 08/11/2020   Morgellons syndrome    MRSA (methicillin resistant Staphylococcus aureus)    Suicide attempt (HCC)    Suicide by drug overdose (HCC) 08/11/2020   Tricuspid valve regurgitation 12/18/2020    Past Surgical History:  Procedure Laterality Date   APPENDECTOMY      Family History  Problem Relation Age of Onset   Heart Problems Mother        abnormal beats   Skin cancer Mother    Heart attack Father 74   Stroke Father    Diabetes Father    Severe combined immunodeficiency Sister    Breast cancer Paternal Aunt    Hypertension Paternal Grandfather    COPD Paternal Grandfather    Ulcerative colitis Neg Hx    Esophageal cancer Neg Hx    Colon cancer Neg Hx    Stomach cancer Neg Hx    Rectal cancer Neg Hx     Social History  Socioeconomic History   Marital status: Single    Spouse name: Not on file   Number of children: 2   Years of education: Not on file   Highest education level: Bachelor's degree (e.g., BA, AB, BS)  Occupational History   Not on file  Tobacco Use   Smoking status: Every Day    Current packs/day: 0.25    Average packs/day: 0.3 packs/day for 5.0 years (1.3 ttl pk-yrs)    Types: Cigarettes   Smokeless tobacco: Never  Vaping Use   Vaping status: Never Used  Substance and Sexual Activity   Alcohol  use: Not Currently    Comment: occas   Drug  use: Not Currently    Types: Cocaine    Comment: Hx of drug abuse   Sexual activity: Not Currently    Birth control/protection: Pill  Other Topics Concern   Not on file  Social History Narrative   ** Merged History Encounter **       Social Drivers of Health   Tobacco Use: High Risk (10/29/2024)   Patient History    Smoking Tobacco Use: Every Day    Smokeless Tobacco Use: Never    Passive Exposure: Not on file  Financial Resource Strain: High Risk (10/28/2024)   Overall Financial Resource Strain (CARDIA)    Difficulty of Paying Living Expenses: Very hard  Food Insecurity: Food Insecurity Present (10/28/2024)   Epic    Worried About Programme Researcher, Broadcasting/film/video in the Last Year: Sometimes true    Ran Out of Food in the Last Year: Sometimes true  Transportation Needs: No Transportation Needs (10/28/2024)   Epic    Lack of Transportation (Medical): No    Lack of Transportation (Non-Medical): No  Physical Activity: Insufficiently Active (10/28/2024)   Exercise Vital Sign    Days of Exercise per Week: 3 days    Minutes of Exercise per Session: 30 min  Stress: Stress Concern Present (10/28/2024)   [employer] of Occupational Health - Occupational Stress Questionnaire    Feeling of Stress: Very much  Social Connections: Socially Isolated (10/28/2024)   Social Connection and Isolation Panel    Frequency of Communication with Friends and Family: Three times a week    Frequency of Social Gatherings with Friends and Family: Twice a week    Attends Religious Services: Never    Database Administrator or Organizations: No    Attends Engineer, Structural: Not on file    Marital Status: Divorced  Depression (PHQ2-9): High Risk (08/07/2024)   Depression (PHQ2-9)    PHQ-2 Score: 11  Alcohol  Screen: Low Risk (07/18/2022)   Alcohol  Screen    Last Alcohol  Screening Score (AUDIT): 6  Housing: High Risk (10/28/2024)   Epic    Unable to Pay for Housing in the Last Year: Yes    Number of Times  Moved in the Last Year: 3    Homeless in the Last Year: No  Utilities: Low Risk (05/25/2024)   Received from [facility]   Utilities    In the past 12 months has the electric, gas, oil, or water  company threatened to shut off services in your home? : No  Health Literacy: Not on file    Allergies[1]  Outpatient Medications Prior to Visit  Medication Sig Dispense Refill   atorvastatin  (LIPITOR) 20 MG tablet Take 1 tablet (20 mg total) by mouth at bedtime. 90 tablet 1   busPIRone  (BUSPAR ) 5 MG tablet Take 1-3 tablets (5-15 mg total) by mouth  daily as needed. 90 tablet 0   cloNIDine  (CATAPRES ) 0.2 MG tablet Take 1 tablet (0.2 mg total) by mouth at bedtime. 30 tablet 1   desvenlafaxine  (PRISTIQ ) 25 MG 24 hr tablet Take 3 tablets (75 mg total) by mouth daily. 90 tablet 1   doxepin  (SINEQUAN ) 10 MG capsule Take 1 capsule (10 mg total) by mouth at bedtime. 30 capsule 0   gabapentin  (NEURONTIN ) 600 MG tablet Take 1 tablet (600 mg total) by mouth in the morning, at noon, in the evening, and at bedtime. 120 tablet 1   mirtazapine  (REMERON  SOL-TAB) 45 MG disintegrating tablet Take 1 tablet (45 mg total) by mouth at bedtime. 30 tablet 1   pantoprazole  (PROTONIX ) 40 MG tablet Take 1 tablet (40 mg total) by mouth 2 (two) times daily before a meal. 180 tablet 1   propranolol  (INDERAL ) 10 MG tablet Take 1 tablet (10 mg total) by mouth 3 (three) times daily as needed for anxiety at the onset of symptoms. 90 tablet 1   valsartan  (DIOVAN ) 160 MG tablet Take 1 tablet (160 mg total) by mouth daily. 90 tablet 1   tiZANidine  (ZANAFLEX ) 4 MG tablet Take 1 tablet (4 mg total) by mouth every 8 (eight) hours as needed. 60 tablet 1   predniSONE  (DELTASONE ) 20 MG tablet Take 1 tablet (20 mg total) by mouth daily with breakfast. (Patient not taking: Reported on 10/29/2024) 5 tablet 0   promethazine  (PHENERGAN ) 25 MG tablet Take 1 tablet (25 mg total) by mouth every 8 (eight) hours as needed for nausea or vomiting. (Patient  not taking: Reported on 10/29/2024) 30 tablet 1   No facility-administered medications prior to visit.     ROS Review of Systems  Constitutional:  Negative for activity change and appetite change.  HENT:  Negative for sinus pressure and sore throat.   Respiratory:  Negative for chest tightness, shortness of breath and wheezing.   Cardiovascular:  Positive for chest pain. Negative for palpitations.  Gastrointestinal:  Positive for abdominal pain and nausea. Negative for abdominal distention and constipation.  Genitourinary: Negative.   Musculoskeletal:        See HPI  Psychiatric/Behavioral:  Negative for behavioral problems and dysphoric mood.        Positive for anxiety    Objective:  BP 110/75   Pulse 81   Temp 97.9 F (36.6 C) (Oral)   Ht 5' 3 (1.6 m)   Wt 172 lb 3.2 oz (78.1 kg)   LMP 10/27/2019   SpO2 98%   BMI 30.50 kg/m      10/29/2024   11:16 AM 10/10/2024    9:57 AM 09/29/2024   11:30 AM  BP/Weight  Systolic BP 110  869  Diastolic BP 75  78  Wt. (Lbs) 172.2    BMI 30.5 kg/m2       Information is confidential and restricted. Go to Review Flowsheets to unlock data.      Physical Exam Constitutional:      Appearance: She is well-developed.  Cardiovascular:     Rate and Rhythm: Normal rate.     Heart sounds: Normal heart sounds. No murmur heard. Pulmonary:     Effort: Pulmonary effort is normal.     Breath sounds: Normal breath sounds. No wheezing or rales.  Chest:     Chest wall: No tenderness.  Abdominal:     General: Bowel sounds are normal. There is no distension.     Palpations: Abdomen is soft. There is no mass.  Tenderness: There is no abdominal tenderness.  Musculoskeletal:        General: Normal range of motion.     Right lower leg: No edema.     Left lower leg: No edema.  Neurological:     Mental Status: She is alert and oriented to person, place, and time.     Comments: fidgetty  Psychiatric:        Mood and Affect: Mood is anxious.         Latest Ref Rng & Units 08/18/2024    3:46 PM 06/10/2024    1:04 PM 06/02/2024    3:41 PM  CMP  Glucose 70 - 99 mg/dL 99   92   BUN 6 - 23 mg/dL 16   14   Creatinine 9.59 - 1.20 mg/dL 9.19   9.24   Sodium 864 - 145 mEq/L 136   134   Potassium 3.5 - 5.1 mEq/L 4.6   4.4   Chloride 96 - 112 mEq/L 104   101   CO2 19 - 32 mEq/L 26   26   Calcium  8.4 - 10.5 mg/dL 9.5   9.8   Total Protein 6.0 - 8.3 g/dL 7.5  7.0  7.7   Total Bilirubin 0.2 - 1.2 mg/dL 0.3  0.2  0.3   Alkaline Phos 39 - 117 U/L 80  86  95   AST 0 - 37 U/L 28  41  60   ALT 0 - 35 U/L 62  102  110     Lipid Panel     Component Value Date/Time   CHOL 227 (H) 09/22/2024 1053   TRIG 140 09/22/2024 1053   HDL 100 09/22/2024 1053   CHOLHDL 2.3 09/22/2024 1053   CHOLHDL 3.4 07/20/2022 0706   VLDL 23 07/20/2022 0706   LDLCALC 104 (H) 09/22/2024 1053    CBC    Component Value Date/Time   WBC 6.1 08/18/2024 1546   RBC 3.87 08/18/2024 1546   HGB 11.6 (L) 08/18/2024 1546   HGB 11.1 12/24/2023 1151   HCT 34.0 (L) 08/18/2024 1546   HCT 32.7 (L) 12/24/2023 1151   PLT 368.0 08/18/2024 1546   PLT 297 12/24/2023 1151   MCV 87.9 08/18/2024 1546   MCV 89 12/24/2023 1151   MCH 30.1 03/10/2024 2047   MCHC 34.2 08/18/2024 1546   RDW 16.4 (H) 08/18/2024 1546   RDW 13.7 12/24/2023 1151   LYMPHSABS 1.6 06/02/2024 1541   LYMPHSABS 1.8 12/24/2023 1151   MONOABS 0.8 06/02/2024 1541   EOSABS 0.1 06/02/2024 1541   EOSABS 0.1 12/24/2023 1151   BASOSABS 0.0 06/02/2024 1541   BASOSABS 0.0 12/24/2023 1151    Lab Results  Component Value Date   HGBA1C 5.6 09/22/2024    Lab Results  Component Value Date   TSH 1.790 09/22/2024       Assessment & Plan Arthralgias/Malar rash Will need to evaluate for possible lupus, Sjogren's syndrome, or other autoimmune conditions. Menopause may exacerbate symptoms. - Ordered ANA and autoimmune panels. - Ordered tests for pheochromocytoma and cortisol levels. - Ordered tests for  Sjogren's syndrome. -Symptoms of blanching and numbness in fingers, consistent with Raynaud's phenomenon. - Refer to rheumatologist if labs are abnormal.  Hypertension Extremely high home blood pressure readings with nocturnal spikes. Current medications include propranolol , valsartan , and tizanidine , which is effective but depleted. -Surprisingly blood pressure in the clinic is 110/75 - Bring home blood pressure monitor to next appointment for comparison. - Continue propranolol  10 mg  three times daily and valsartan  160 mg daily.   Anxiety Anxiety contributes to sleep difficulties. Current medications include desvenlafaxine  and hydroxyzine , mirtazapine , BuSpar , doxepin , gabapentin  - She is currently under psychiatric care   Flushing - Vasomotor symptoms of menopause could be contributing as well - Will send off endocrine labs  Cocaine use in remission - She states her last cocaine use was 6 months ago. - She is fidgety in clinic and so I will proceed with ordering a urine drug screen and ethanol level - Will also need to check hormone levels; last TSH was normal in 08/2024  General Health Maintenance History of smoking reduced to four cigarettes daily; cessation is important.  No alcohol  or drug use since pancreatitis. Recent normal colonoscopy and endoscopy with polyp removal. Advised that he needs to go to the ED whenever she has chest pain to exclude cardiac causes.     Meds ordered this encounter  Medications   tiZANidine  (ZANAFLEX ) 4 MG tablet    Sig: Take 1 tablet (4 mg total) by mouth every 8 (eight) hours as needed.    Dispense:  90 tablet    Refill:  2    Follow-up: Return in about 6 weeks (around 12/10/2024) for Blood Pressure follow-up with PCP.       Corrina Sabin, MD, FAAFP. [facility] [place], [place] [phone]   10/29/2024, 12:41 PM     [1]  Allergies Allergen Reactions   Lamictal [Lamotrigine] Anaphylaxis,  Rash and Other (See Comments)    Stevens-Johnson syndrome and fevers, also   Lamictal [Lamotrigine] Anaphylaxis, Rash and Other (See Comments)    Fevers and Stevens-Johnson syndrome, also   Tramadol  Other (See Comments)    Pt had a seizure after taking Tramadol !!   Erythromycin Nausea And Vomiting   Geodon  [Ziprasidone  Hcl] Rash   Lamisil  [Terbinafine ] Nausea And Vomiting   Levetiracetam Anxiety   "

## 2024-10-29 NOTE — Telephone Encounter (Signed)
 Labs were not collected properly and patient will need to come back for [facility] and Metanephrines need to be reordered.

## 2024-10-29 NOTE — Patient Instructions (Signed)
Please bring your blood pressure monitor with you to your next appointment.

## 2024-10-29 NOTE — Addendum Note (Signed)
 Addended by: Nashalie Sallis on: 10/29/2024 05:05 PM   Modules accepted: Orders

## 2024-10-30 ENCOUNTER — Ambulatory Visit

## 2024-10-30 ENCOUNTER — Ambulatory Visit: Payer: Self-pay

## 2024-10-30 ENCOUNTER — Other Ambulatory Visit (HOSPITAL_BASED_OUTPATIENT_CLINIC_OR_DEPARTMENT_OTHER): Payer: Self-pay

## 2024-10-30 LAB — SEDIMENTATION RATE: Sed Rate: 49 mm/h — ABNORMAL HIGH (ref 0–40)

## 2024-10-30 LAB — SJOGRENS SYNDROME-B EXTRACTABLE NUCLEAR ANTIBODY: ENA SSB (LA) Ab: <0.2 AI (ref 0.0–0.9)

## 2024-10-30 LAB — ANA,IFA RA DIAG PNL W/RFLX TIT/PATN: ANA Titer 1: NEGATIVE

## 2024-10-30 LAB — CORTISOL: Cortisol: 14.4 ug/dL (ref 6.2–19.4)

## 2024-10-30 LAB — ALDOSTERONE

## 2024-10-30 LAB — ANTI-DNA ANTIBODY, DOUBLE-STRANDED: dsDNA Ab: 1 [IU]/mL (ref 0–9)

## 2024-10-30 LAB — C-REACTIVE PROTEIN: CRP: 1 mg/L (ref 0–10)

## 2024-10-30 LAB — ANTI-SMITH ANTIBODY: ENA SM Ab Ser-aCnc: <0.2 AI (ref 0.0–0.9)

## 2024-10-30 LAB — SJOGRENS SYNDROME-A EXTRACTABLE NUCLEAR ANTIBODY: ENA SSA (RO) Ab: 0.2 AI (ref 0.0–0.9)

## 2024-10-30 NOTE — Telephone Encounter (Signed)
 Patient has been informed.

## 2024-10-31 ENCOUNTER — Other Ambulatory Visit (HOSPITAL_COMMUNITY): Payer: Self-pay

## 2024-10-31 ENCOUNTER — Other Ambulatory Visit: Payer: Self-pay

## 2024-10-31 ENCOUNTER — Other Ambulatory Visit (HOSPITAL_BASED_OUTPATIENT_CLINIC_OR_DEPARTMENT_OTHER): Payer: Self-pay

## 2024-10-31 ENCOUNTER — Ambulatory Visit (HOSPITAL_COMMUNITY): Admitting: Psychiatry

## 2024-10-31 DIAGNOSIS — F3341 Major depressive disorder, recurrent, in partial remission: Secondary | ICD-10-CM

## 2024-10-31 DIAGNOSIS — I10 Essential (primary) hypertension: Secondary | ICD-10-CM

## 2024-10-31 DIAGNOSIS — F411 Generalized anxiety disorder: Secondary | ICD-10-CM

## 2024-10-31 DIAGNOSIS — G4700 Insomnia, unspecified: Secondary | ICD-10-CM

## 2024-10-31 LAB — 730728 U15-UNBUND+CRT
6-Acetylmorphine, Urine: NEGATIVE ng/mL
Amphetamine Screen, Urine: NEGATIVE ng/mL
BENZODIAZ UR QL: NEGATIVE ng/mL
Barbiturate screen, urine: NEGATIVE ng/mL
Buprenorphine, Urine: NEGATIVE ng/mL
Cannabinoid: NEGATIVE ng/mL
Cocaine (Metab.), Urine: NEGATIVE ng/mL
Creatinine, Urine: 27.4 mg/dL (ref 20.0–300.0)
Fentanyl, Urine: NEGATIVE ng/mL
Methadone Screen, Urine: NEGATIVE ng/mL
OPIATE SCREEN URINE: NEGATIVE ng/mL
Oxycodone+Oxymorphone Ur Ql Scn: NEGATIVE ng/mL
PCP, Urine: NEGATIVE ng/mL
Propoxyphene: NEGATIVE ng/mL
TRAMADOL: NEGATIVE ng/mL
pH, Urine: 5.8 (ref 4.5–8.9)

## 2024-10-31 LAB — ETHANOL

## 2024-10-31 MED ORDER — DESVENLAFAXINE SUCCINATE ER 25 MG PO TB24
75.0000 mg | ORAL_TABLET | Freq: Every day | ORAL | 1 refills | Status: AC
  Filled 2024-10-31: qty 90, 30d supply, fill #0

## 2024-10-31 MED ORDER — HYDROXYZINE HCL 50 MG PO TABS: 50.0000 mg | ORAL_TABLET | Freq: Three times a day (TID) | ORAL | 2 refills | Status: AC | PRN

## 2024-10-31 MED ORDER — CLONAZEPAM 0.5 MG PO TABS
0.5000 mg | ORAL_TABLET | Freq: Two times a day (BID) | ORAL | 0 refills | Status: AC | PRN
  Filled 2024-10-31 (×2): qty 28, 14d supply, fill #0

## 2024-10-31 MED ORDER — CLONIDINE HCL 0.2 MG PO TABS
0.2000 mg | ORAL_TABLET | Freq: Every day | ORAL | 1 refills | Status: AC
  Filled 2024-10-31: qty 30, 30d supply, fill #0

## 2024-10-31 MED ORDER — GABAPENTIN 600 MG PO TABS
600.0000 mg | ORAL_TABLET | Freq: Four times a day (QID) | ORAL | 1 refills | Status: AC
  Filled ????-??-??: fill #0

## 2024-10-31 MED ORDER — PROPRANOLOL HCL 10 MG PO TABS
10.0000 mg | ORAL_TABLET | Freq: Three times a day (TID) | ORAL | 1 refills | Status: AC | PRN
  Filled 2024-10-31: qty 90, 30d supply, fill #0

## 2024-10-31 MED ORDER — MIRTAZAPINE 45 MG PO TBDP: 45.0000 mg | ORAL_TABLET | Freq: Every day | ORAL | 1 refills | Status: AC

## 2024-12-02 ENCOUNTER — Ambulatory Visit (HOSPITAL_COMMUNITY)

## 2024-12-05 ENCOUNTER — Encounter (HOSPITAL_COMMUNITY): Admitting: Psychiatry

## 2024-12-10 ENCOUNTER — Ambulatory Visit: Payer: Self-pay | Admitting: Family Medicine

## 2025-02-02 ENCOUNTER — Ambulatory Visit: Admitting: Family Medicine
